# Patient Record
Sex: Female | Born: 1945
Health system: Southern US, Community
[De-identification: ages and names within clinical notes are randomized; demographics above are authoritative.]

## PROBLEM LIST (undated history)

## (undated) DIAGNOSIS — M1990 Unspecified osteoarthritis, unspecified site: Secondary | ICD-10-CM

## (undated) DIAGNOSIS — Z923 Personal history of irradiation: Secondary | ICD-10-CM

## (undated) DIAGNOSIS — K8020 Calculus of gallbladder without cholecystitis without obstruction: Secondary | ICD-10-CM

## (undated) DIAGNOSIS — K222 Esophageal obstruction: Secondary | ICD-10-CM

## (undated) DIAGNOSIS — G4700 Insomnia, unspecified: Secondary | ICD-10-CM

## (undated) DIAGNOSIS — I499 Cardiac arrhythmia, unspecified: Secondary | ICD-10-CM

## (undated) DIAGNOSIS — R05 Cough: Secondary | ICD-10-CM

## (undated) DIAGNOSIS — Z803 Family history of malignant neoplasm of breast: Secondary | ICD-10-CM

## (undated) DIAGNOSIS — J189 Pneumonia, unspecified organism: Secondary | ICD-10-CM

## (undated) DIAGNOSIS — K219 Gastro-esophageal reflux disease without esophagitis: Secondary | ICD-10-CM

## (undated) DIAGNOSIS — T4145XA Adverse effect of unspecified anesthetic, initial encounter: Secondary | ICD-10-CM

## (undated) DIAGNOSIS — T8859XA Other complications of anesthesia, initial encounter: Secondary | ICD-10-CM

## (undated) DIAGNOSIS — R059 Cough, unspecified: Secondary | ICD-10-CM

## (undated) DIAGNOSIS — Z1379 Encounter for other screening for genetic and chromosomal anomalies: Principal | ICD-10-CM

## (undated) DIAGNOSIS — K449 Diaphragmatic hernia without obstruction or gangrene: Secondary | ICD-10-CM

## (undated) DIAGNOSIS — R945 Abnormal results of liver function studies: Secondary | ICD-10-CM

## (undated) DIAGNOSIS — N200 Calculus of kidney: Secondary | ICD-10-CM

## (undated) DIAGNOSIS — F329 Major depressive disorder, single episode, unspecified: Secondary | ICD-10-CM

## (undated) DIAGNOSIS — C50412 Malignant neoplasm of upper-outer quadrant of left female breast: Secondary | ICD-10-CM

## (undated) DIAGNOSIS — E039 Hypothyroidism, unspecified: Secondary | ICD-10-CM

## (undated) DIAGNOSIS — R7989 Other specified abnormal findings of blood chemistry: Secondary | ICD-10-CM

## (undated) DIAGNOSIS — Z87442 Personal history of urinary calculi: Secondary | ICD-10-CM

## (undated) DIAGNOSIS — N281 Cyst of kidney, acquired: Secondary | ICD-10-CM

## (undated) DIAGNOSIS — E785 Hyperlipidemia, unspecified: Secondary | ICD-10-CM

## (undated) DIAGNOSIS — F32A Depression, unspecified: Secondary | ICD-10-CM

## (undated) HISTORY — DX: Abnormal results of liver function studies: R94.5

## (undated) HISTORY — PX: TOOTH EXTRACTION: SUR596

## (undated) HISTORY — DX: Hyperlipidemia, unspecified: E78.5

## (undated) HISTORY — PX: NASAL SINUS SURGERY: SHX719

## (undated) HISTORY — DX: Hypothyroidism, unspecified: E03.9

## (undated) HISTORY — DX: Calculus of gallbladder without cholecystitis without obstruction: K80.20

## (undated) HISTORY — PX: OTHER SURGICAL HISTORY: SHX169

## (undated) HISTORY — DX: Esophageal obstruction: K22.2

## (undated) HISTORY — DX: Insomnia, unspecified: G47.00

## (undated) HISTORY — DX: Cough: R05

## (undated) HISTORY — DX: Encounter for other screening for genetic and chromosomal anomalies: Z13.79

## (undated) HISTORY — PX: BREAST SURGERY: SHX581

## (undated) HISTORY — DX: Family history of malignant neoplasm of breast: Z80.3

## (undated) HISTORY — DX: Cyst of kidney, acquired: N28.1

## (undated) HISTORY — DX: Other specified abnormal findings of blood chemistry: R79.89

## (undated) HISTORY — DX: Diaphragmatic hernia without obstruction or gangrene: K44.9

## (undated) HISTORY — DX: Major depressive disorder, single episode, unspecified: F32.9

## (undated) HISTORY — DX: Gastro-esophageal reflux disease without esophagitis: K21.9

## (undated) HISTORY — DX: Cough, unspecified: R05.9

## (undated) HISTORY — PX: COLONOSCOPY: SHX174

## (undated) HISTORY — PX: DILATION AND CURETTAGE OF UTERUS: SHX78

## (undated) HISTORY — DX: Depression, unspecified: F32.A

## (undated) HISTORY — DX: Malignant neoplasm of upper-outer quadrant of left female breast: C50.412

## (undated) HISTORY — DX: Calculus of kidney: N20.0

---

## 1998-07-06 ENCOUNTER — Other Ambulatory Visit: Admission: RE | Admit: 1998-07-06 | Discharge: 1998-07-06 | Payer: Self-pay | Admitting: Gynecology

## 1999-04-19 ENCOUNTER — Other Ambulatory Visit: Admission: RE | Admit: 1999-04-19 | Discharge: 1999-04-19 | Payer: Self-pay | Admitting: Gynecology

## 1999-04-23 ENCOUNTER — Ambulatory Visit (HOSPITAL_COMMUNITY): Admission: RE | Admit: 1999-04-23 | Discharge: 1999-04-23 | Payer: Self-pay | Admitting: Gynecology

## 2000-07-06 ENCOUNTER — Other Ambulatory Visit: Admission: RE | Admit: 2000-07-06 | Discharge: 2000-07-06 | Payer: Self-pay | Admitting: Gynecology

## 2001-07-30 ENCOUNTER — Other Ambulatory Visit: Admission: RE | Admit: 2001-07-30 | Discharge: 2001-07-30 | Payer: Self-pay | Admitting: Gynecology

## 2002-05-20 ENCOUNTER — Encounter: Payer: Self-pay | Admitting: Internal Medicine

## 2002-06-05 ENCOUNTER — Ambulatory Visit (HOSPITAL_COMMUNITY): Admission: RE | Admit: 2002-06-05 | Discharge: 2002-06-05 | Payer: Self-pay | Admitting: *Deleted

## 2002-06-05 ENCOUNTER — Encounter (INDEPENDENT_AMBULATORY_CARE_PROVIDER_SITE_OTHER): Payer: Self-pay | Admitting: *Deleted

## 2002-06-05 ENCOUNTER — Encounter: Payer: Self-pay | Admitting: Internal Medicine

## 2002-07-08 ENCOUNTER — Other Ambulatory Visit: Admission: RE | Admit: 2002-07-08 | Discharge: 2002-07-08 | Payer: Self-pay | Admitting: Gynecology

## 2003-07-03 ENCOUNTER — Other Ambulatory Visit: Admission: RE | Admit: 2003-07-03 | Discharge: 2003-07-03 | Payer: Self-pay | Admitting: Gynecology

## 2004-07-08 ENCOUNTER — Other Ambulatory Visit: Admission: RE | Admit: 2004-07-08 | Discharge: 2004-07-08 | Payer: Self-pay | Admitting: Gynecology

## 2005-07-14 ENCOUNTER — Other Ambulatory Visit: Admission: RE | Admit: 2005-07-14 | Discharge: 2005-07-14 | Payer: Self-pay | Admitting: Gynecology

## 2006-07-17 HISTORY — PX: US ECHOCARDIOGRAPHY: HXRAD669

## 2006-07-19 HISTORY — PX: CARDIOVASCULAR STRESS TEST: SHX262

## 2007-08-28 ENCOUNTER — Emergency Department (HOSPITAL_COMMUNITY): Admission: EM | Admit: 2007-08-28 | Discharge: 2007-08-28 | Payer: Self-pay | Admitting: Emergency Medicine

## 2010-02-12 ENCOUNTER — Encounter: Payer: Self-pay | Admitting: Internal Medicine

## 2010-07-22 ENCOUNTER — Encounter: Admission: RE | Admit: 2010-07-22 | Discharge: 2010-07-22 | Payer: Self-pay | Admitting: Cardiology

## 2010-07-22 ENCOUNTER — Ambulatory Visit: Payer: Self-pay | Admitting: Cardiology

## 2010-07-28 ENCOUNTER — Ambulatory Visit: Payer: Self-pay | Admitting: Cardiology

## 2010-11-02 ENCOUNTER — Encounter (INDEPENDENT_AMBULATORY_CARE_PROVIDER_SITE_OTHER): Payer: Self-pay | Admitting: *Deleted

## 2010-11-08 ENCOUNTER — Encounter (INDEPENDENT_AMBULATORY_CARE_PROVIDER_SITE_OTHER): Payer: Self-pay | Admitting: *Deleted

## 2010-11-15 ENCOUNTER — Ambulatory Visit: Payer: Self-pay | Admitting: Internal Medicine

## 2010-11-15 DIAGNOSIS — K8020 Calculus of gallbladder without cholecystitis without obstruction: Secondary | ICD-10-CM | POA: Insufficient documentation

## 2010-11-15 DIAGNOSIS — G4700 Insomnia, unspecified: Secondary | ICD-10-CM | POA: Insufficient documentation

## 2010-11-15 DIAGNOSIS — K5900 Constipation, unspecified: Secondary | ICD-10-CM | POA: Insufficient documentation

## 2010-11-15 DIAGNOSIS — R740 Nonspecific elevation of levels of transaminase and lactic acid dehydrogenase [LDH]: Secondary | ICD-10-CM

## 2010-11-15 DIAGNOSIS — E785 Hyperlipidemia, unspecified: Secondary | ICD-10-CM

## 2010-11-15 DIAGNOSIS — R109 Unspecified abdominal pain: Secondary | ICD-10-CM | POA: Insufficient documentation

## 2010-11-15 DIAGNOSIS — F329 Major depressive disorder, single episode, unspecified: Secondary | ICD-10-CM

## 2010-11-15 DIAGNOSIS — K219 Gastro-esophageal reflux disease without esophagitis: Secondary | ICD-10-CM | POA: Insufficient documentation

## 2010-11-15 DIAGNOSIS — E039 Hypothyroidism, unspecified: Secondary | ICD-10-CM | POA: Insufficient documentation

## 2010-11-15 DIAGNOSIS — N281 Cyst of kidney, acquired: Secondary | ICD-10-CM

## 2010-11-15 DIAGNOSIS — R05 Cough: Secondary | ICD-10-CM

## 2010-11-17 ENCOUNTER — Ambulatory Visit (HOSPITAL_COMMUNITY)
Admission: RE | Admit: 2010-11-17 | Discharge: 2010-11-17 | Payer: Self-pay | Source: Home / Self Care | Admitting: Internal Medicine

## 2010-11-30 ENCOUNTER — Encounter: Payer: Self-pay | Admitting: Internal Medicine

## 2010-12-27 LAB — CBC
HCT: 42.6 % (ref 36.0–46.0)
Hemoglobin: 13.9 g/dL (ref 12.0–15.0)
MCH: 29.6 pg (ref 26.0–34.0)
MCHC: 32.6 g/dL (ref 30.0–36.0)
MCV: 90.6 fL (ref 78.0–100.0)
Platelets: 324 10*3/uL (ref 150–400)
RBC: 4.7 MIL/uL (ref 3.87–5.11)
RDW: 13.2 % (ref 11.5–15.5)
WBC: 7.1 10*3/uL (ref 4.0–10.5)

## 2010-12-27 LAB — URINALYSIS, ROUTINE W REFLEX MICROSCOPIC
Bilirubin Urine: NEGATIVE
Ketones, ur: NEGATIVE mg/dL
Leukocytes, UA: NEGATIVE
Nitrite: NEGATIVE
Protein, ur: NEGATIVE mg/dL
Specific Gravity, Urine: 1.011 (ref 1.005–1.030)
Urine Glucose, Fasting: NEGATIVE mg/dL
Urobilinogen, UA: 0.2 mg/dL (ref 0.0–1.0)
pH: 6 (ref 5.0–8.0)

## 2010-12-27 LAB — COMPREHENSIVE METABOLIC PANEL
ALT: 40 U/L — ABNORMAL HIGH (ref 0–35)
AST: 29 U/L (ref 0–37)
Albumin: 4.3 g/dL (ref 3.5–5.2)
Alkaline Phosphatase: 81 U/L (ref 39–117)
BUN: 10 mg/dL (ref 6–23)
CO2: 28 mEq/L (ref 19–32)
Calcium: 9.8 mg/dL (ref 8.4–10.5)
Chloride: 108 mEq/L (ref 96–112)
Creatinine, Ser: 0.82 mg/dL (ref 0.4–1.2)
GFR calc Af Amer: >60 mL/min (ref >60)
GFR calc non Af Amer: >60 mL/min (ref >60)
Glucose, Bld: 93 mg/dL (ref 70–99)
Potassium: 5 mEq/L (ref 3.5–5.1)
Sodium: 143 mEq/L (ref 135–145)
Total Bilirubin: 0.5 mg/dL (ref 0.3–1.2)
Total Protein: 7.4 g/dL (ref 6.0–8.3)

## 2010-12-27 LAB — URINE MICROSCOPIC-ADD ON

## 2010-12-27 LAB — DIFFERENTIAL
Basophils Absolute: 0.1 10*3/uL (ref 0.0–0.1)
Basophils Relative: 1 % (ref 0–1)
Eosinophils Absolute: 0.3 10*3/uL (ref 0.0–0.7)
Eosinophils Relative: 5 % (ref 0–5)
Lymphocytes Relative: 38 % (ref 12–46)
Lymphs Abs: 2.7 10*3/uL (ref 0.7–4.0)
Monocytes Absolute: 0.6 10*3/uL (ref 0.1–1.0)
Monocytes Relative: 9 % (ref 3–12)
Neutro Abs: 3.4 10*3/uL (ref 1.7–7.7)
Neutrophils Relative %: 48 % (ref 43–77)

## 2010-12-27 LAB — SURGICAL PCR SCREEN
MRSA, PCR: NEGATIVE
Staphylococcus aureus: NEGATIVE

## 2010-12-27 LAB — PROTIME-INR
INR: 1.04 (ref 0.00–1.49)
Prothrombin Time: 13.8 seconds (ref 11.6–15.2)

## 2010-12-30 ENCOUNTER — Encounter (INDEPENDENT_AMBULATORY_CARE_PROVIDER_SITE_OTHER): Payer: Self-pay | Admitting: Surgery

## 2010-12-30 ENCOUNTER — Ambulatory Visit (HOSPITAL_COMMUNITY)
Admission: RE | Admit: 2010-12-30 | Discharge: 2010-12-31 | Payer: Self-pay | Source: Home / Self Care | Attending: Surgery | Admitting: Surgery

## 2010-12-30 HISTORY — PX: CHOLECYSTECTOMY: SHX55

## 2011-01-02 NOTE — Discharge Summary (Signed)
  [name], [name]                   ACCOUNT NO.:  [id number]  MEDICAL RECORD NO.:  [id number]          PATIENT TYPE:  OIB  LOCATION:  1538                         FACILITY:  [facility]  PHYSICIAN:  Velora Heckler, MD      DATE OF BIRTH:  1946-08-08  DATE OF ADMISSION:  12/30/2010 DATE OF DISCHARGE:  12/31/2010                              DISCHARGE SUMMARY   REASON FOR ADMISSION:  Symptomatic cholelithiasis.  BRIEF HISTORY:  The patient is a 65 year old white female from [place], [place].  She has had intermittent epigastric abdominal pain.  Ultrasound demonstrated gallstones.  The patient now comes to surgery for cholecystectomy.  HOSPITAL COURSE:  The patient was admitted on January 19 and taken directly to the operating room.  She underwent laparoscopic cholecystectomy with intraoperative cholangiography.  On cholangiogram, there was a small filling defect noted in the common hepatic duct.  This was reviewed by Dr. Francene Boyers from radiology.  While it is possible that this represents choledocholithiasis, it was felt to be more likely to be overlapping shadows versus a small air bubble.  Postoperatively, the patient remained stable and did well.  She is prepared for discharge home on the first postoperative day.  DISCHARGE/PLAN:  The patient is discharged home on December 31, 2010, in good condition, tolerating a regular diet and ambulating independently.  DISCHARGE MEDICATIONS:  Include: 1. Percocet for pain. 2. Ultram for pain. 3. Ambien for sleep.  The patient will be seen back in my office at [facility] in 2-3 weeks for wound check.  We will check a hepatic function profile prior to that office visit.  FINAL DIAGNOSIS:  Cholelithiasis, final pathology results pending at the time of discharge.  CONDITION AT DISCHARGE:  Good.     Velora Heckler, MD     TMG/MEDQ  D:  12/31/2010  T:  12/31/2010  Job:  [id number]  cc:   Hedwig Morton. Juanda Chance, MD [address] [place] [address]  Gwen Pounds, MD Fax: [phone]  Electronically Signed by Darnell Level MD on 01/02/2011 10:01:37 AM

## 2011-01-02 NOTE — Op Note (Signed)
NAMESAVI, LASTINGER                   ACCOUNT NO.:  [id number]  MEDICAL RECORD NO.:  [id number]          PATIENT TYPE:  AMB  LOCATION:  DAY                          FACILITY:  [facility]  PHYSICIAN:  Velora Heckler, MD      DATE OF BIRTH:  09/20/1946  DATE OF PROCEDURE:  12/30/2010 DATE OF DISCHARGE:                              OPERATIVE REPORT   PREOPERATIVE DIAGNOSIS:  Symptomatic cholelithiasis.  POSTOPERATIVE DIAGNOSIS:  Symptomatic cholelithiasis.  PROCEDURE:  Laparoscopic cholecystectomy with intraoperative cholangiography.  SURGEON:  Velora Heckler, M.D.  ANESTHESIA:  General per Jill Side, M.D..  ESTIMATED BLOOD LOSS:  Minimal.  PREPARATION:  ChloraPrep.  COMPLICATIONS:  None.  INDICATIONS:  The patient is a 65 year old white female from [place], [place].  [name].  The patient underwent workup including CT scan of the abdomen and pelvis which had an incidental finding of gallstones.  Angela was seen by Dr. [name] and an abdominal ultrasound confirmed gallstones with the largest measuring 1.5 cm.  No sign of biliary obstruction.  The patient took proton pump inhibitors with some improvement in her symptoms.  Angela now comes to surgery for cholecystectomy for presumed symptomatic cholelithiasis.  BODY OF REPORT:  The procedure was done in OR #6 at the [facility].  The patient was brought to the operating room and placed in the supine position on the operating room table.  Following administration of general anesthesia, the patient was positioned and then prepped and draped in the usual strict aseptic fashion.  After ascertaining that an adequate level of anesthesia had been achieved, an infraumbilical incision was made with a #10 blade.  Dissection was carried down through subcutaneous tissues.  There was a small umbilical hernia which was dissected out and  reduced.  Peritoneum was opened.  A 0 Vicryl pursestring suture was placed in the fascia.  An Hasson cannula was introduced under direct vision and secured with a pursestring suture.  Abdomen was insufflated with carbon dioxide.  Laparoscope was introduced and the abdomen explored.  Operative ports were placed along the right costal margin in the midline, midclavicular line, and anterior axillary line.  Fundus of the gallbladder was grasped and retracted cephalad.  Dissection was begun at the neck of the gallbladder.  Peritoneum was incised.  Cystic duct was dissected out along its length and a clip was placed at the neck of the gallbladder.  Cystic duct was incised and clear yellow bile emanates from the cystic duct.  A Cook cholangiography catheter was introduced through a stab wound in the right upper quadrant.  It was inserted into the cystic duct and secured with a Ligaclip.  Using C-arm fluoroscopy, real time cholangiography was performed.  There was rapid filling of a normal caliber common bile duct.  There was free flow distally into the duodenum.  There was reflux of contrast into the left and right hepatic ductal systems.  There was a filling defect in the common hepatic duct.  This was not obstructive. It is difficult to  ascertain whether this represents a stone or an air bubble or overlapping shadows.  Likewise, Dr. Francene Boyers from Radiology reviewed the film intraoperatively and could not tell for certain whether this represented a stone or an air bubble, although he favored an air bubble.  Clip was withdrawn and Cook catheter was removed from the cystic duct. Cystic duct was then triply clipped and divided.  Cystic artery was dissected out, singly clipped and divided with the electrocautery. Gallbladder was then excised from the gallbladder bed using the hook electrocautery for hemostasis.  Gallbladder was completely excised and then placed into an EndoCatch bag.  The  gallbladder was withdrawn through the umbilical port.  It contains at least one gallstone measuring slightly over 1 cm in size.  The entire gallbladder and contents were submitted to Pathology for review.  Good hemostasis was noted in the right upper quadrant.  Ports were removed under direct vision and good hemostasis was noted at all port sites.  Pneumoperitoneum was released.  At the umbilicus the pursestring suture was removed.  The fascial defect was then closed with interrupted 0 Prolene simple sutures.  Good hemostasis was noted.  Skin incisions were anesthetized with local anesthetic.  All incisions were closed with interrupted 4-0 Monocryl subcuticular sutures.  Wounds were washed and dried and Benzoin and Steri-Strips were applied.  Sterile dressings were applied.  The patient was awakened from anesthesia and brought to the recovery room.  The patient tolerated the procedure well.     Velora Heckler, MD     TMG/MEDQ  D:  12/30/2010  T:  12/30/2010  Job:  [id number]  cc:   Velora Heckler, MD [address] [place] [address]  Hedwig Morton. Juanda Chance, MD [address] [place] [address]  Gwen Pounds, MD Fax: [phone] Electronically Signed by Darnell Level MD on 01/02/2011 10:01:31 AM

## 2011-01-13 ENCOUNTER — Encounter: Payer: Self-pay | Admitting: Internal Medicine

## 2011-01-13 NOTE — Letter (Signed)
Summary: [facility]  [facility]   Imported By: Lamona Curl CMA (AAMA) 11/15/2010 11:37:37  _____________________________________________________________________  External Attachment:    Type:   Image     Comment:   External Document

## 2011-01-13 NOTE — Letter (Signed)
Summary: [facility]  [facility]   Imported By: Sherian Rein 11/18/2010 11:36:12  _____________________________________________________________________  External Attachment:    Type:   Image     Comment:   External Document

## 2011-01-13 NOTE — Letter (Signed)
Summary: CT ABD/[facility]  [facility]   Imported By: Lamona Curl CMA (AAMA) 11/15/2010 11:55:56  _____________________________________________________________________  External Attachment:    Type:   Image     Comment:   External Document

## 2011-01-13 NOTE — Letter (Signed)
Summary: [facility]   Imported By: Lennie Odor 12/22/2010 15:45:39  _____________________________________________________________________  External Attachment:    Type:   Image     Comment:   External Document

## 2011-01-13 NOTE — Procedures (Signed)
Summary: COLON   Colonoscopy  Procedure date:  06/05/2002  Findings:      Location:  [facility].                     [facility]  Patient:    [name], [name] Visit Number: [id number] MRN: [id number]          Service Type: END Location: ENDO Attending Physician:  Melvenia Needles Dictated by:   Everardo All Madilyn Fireman, M.D. Proc. Date: 06/05/02 Admit Date:  06/05/2002   CC:         Thomas A. Patty Sermons, M.D.   Procedure Report  PROCEDURE PERFORMED:  Colonoscopy with biopsy.  ENDOSCOPIST:  Everardo All. Madilyn Fireman, M.D.  INDICATIONS FOR PROCEDURE:  Chronic diarrhea and colon cancer screening in a 65 year old patient with a one to two-year history of diarrhea and no prior colon screening.  DESCRIPTION OF PROCEDURE:  The patient was placed in the left lateral decubitus position and placed on the pulse monitor with continuous low flow oxygen delivered by nasal cannula.   She was sedated with 100 mg of IV Demerol and 8 mg of IV Versed.  The Olympus video colonoscope was inserted into the rectum and advanced to the cecum, confirmed transillumination of McBurneys point and visualization of the ileocecal valve and appendiceal orifice.  Prep was excellent.  The terminal ileum was intubated for several centimeters and appeared to be within normal limits.  The cecum, ascending, transverse, descending and sigmoid colon all appeared normal with no masses, polyps, diverticula or other mucosal abnormalities.  The rectum likewise appeared normal and retroflex view of the anus revealed no obvious internal hemorrhoids.  Biopsies of the rectum were taken to rule out collagenous or microscopic colitis.  The scope was then withdrawn and the patient returned to the recovery room in stable condition.  The patient tolerated the procedure well and there were no immediate complications.  IMPRESSION:  Normal colonoscopy including the terminal ileum.  PLAN: Await histology on rectal  biopsies and would pursue further work-up of diarrhea as indicated.  Repeat flexible sigmoidoscopy in five years for colon screening. Dictated by:   Everardo All Madilyn Fireman, M.D. Attending Physician:  Melvenia Needles DD:  06/05/02 TD:  06/06/02 Job: [id number]  FINAL DIAGNOSIS    ***MICROSCOPIC EXAMINATION AND DIAGNOSIS***    RECTUM, BIOPSY:   - RARE CRYPTITIS.   - NO ATYPIA OR EVIDENCE OF MALIGNANCY.    COMMENT   There is a rare gland that contains a neutrophil or eosinophil   consistent with rare cryptitis. There are some benign   intramucosal lymphoid aggregates. The basement membrane does not   appear thickened in the H & E stained sections and therefore the   findings do not support the diagnosis of collagenous colitis. For   this reason, a Masson' s trichrome stain will not be performed   unless otherwise directed by the clinician. (JAS:gt) 06/06/02    gdt   Date Reported: 06/06/2002 Berneta Levins, MD   *** Electronically Signed Out By JAS ***    Clinical information   R/o collagenous colitis (lb)    specimen(s) obtained   Rectum, biopsy (cold)    Gross Description   Received in formalin are tan, soft tissue fragments that are   submitted in toto. Number:2   Size: each 0.3 cm. (GP:jn, 06/05/02)    jn/

## 2011-01-13 NOTE — Assessment & Plan Note (Signed)
Summary: chest pain, GERD sx/dns   History of Present Illness Visit Type: Initial Visit Primary GI MD: [name] MD Primary Provider: Creola Corn, MD Chief Complaint: Chest pain non-cardiac, GERD, hiatal hernia, gallstone History of Present Illness:   This is a 65 year old white female who is here to discuss an incidental finding of a solitary gallstone on a CT scan of the abdomen performed by urology for evaluation of hematuria one week ago. Patient brought the CD with her and I have reviewed it. The CT Scan showed a solitary gallstone in the gallbladder. Her common bile duct was normal size. There is a hiatal hernia. The urologist referred the patient for a cholecystectomy. Her appointment will be 11/30/10. In the meantime, she wanted to discuss all her symptoms of chest pain which have been evaluated by her internist as well as by gastroenterologist Dr. Madilyn Fireman on previous occasions. She had a negative cardiology workup by Dr. Patty Sermons. She describes almost constant discomfort in the subxiphoid area which is not related to breathing or motion but may be aggravated by eating. She has known gastroesophageal reflux and has been on proton pump inhibitors since 2003. She takes Nexium daily. Her reflux has been refractory to Nexium. She denies dysphagia, hoarseness or nocturnal cough. She also had a colonoscopy for neoplastic screening by Dr. Madilyn Fireman in 2003 and the exam was normal. Her insurance will approve a repeat colonoscopy in 10 years from the initial exam. There is no family history of gallbladder disease. She does not drink alcohol excessively. She has gained about 20 pounds in the last 2 years and she has been under extreme stress due to the death of several relatives. She is on Lexapro for depression.   GI Review of Systems    Reports acid reflux, bloating, chest pain, and  nausea.      Denies abdominal pain, belching, dysphagia with liquids, dysphagia with solids, heartburn, loss of appetite,  vomiting, vomiting blood, weight loss, and  weight gain.        Denies anal fissure, black tarry stools, change in bowel habit, constipation, diarrhea, diverticulosis, fecal incontinence, heme positive stool, hemorrhoids, irritable bowel syndrome, jaundice, light color stool, liver problems, rectal bleeding, and  rectal pain.     Current Medications (verified): 1)  Ambien 10 Mg Tabs (Zolpidem Tartrate) .... Take 1 Tab By Mouth At Bedtime As Needed 2)  Clarinex 5 Mg Tabs (Desloratadine) .... Take 1 Tablet By Mouth Once A Day 3)  Crestor 10 Mg Tabs (Rosuvastatin Calcium) .... Take 1/2 Tablet By Mouth Once Daily 4)  Lexapro 10 Mg Tabs (Escitalopram Oxalate) .... Take 1 Tablet Daily 5)  Estrogel 0.75 Mg/1.25 Gm (0.06%) Gel (Estradiol) .... Aply As Directed 6)  Nexium 40 Mg Cpdr (Esomeprazole Magnesium) .... Take 1 Capsule By Mouth Daily 7)  Rhinocort Aqua 32 Mcg/act Susp (Budesonide) .... Use As Directed 8)  Synthroid 50 Mcg Tabs (Levothyroxine Sodium) .... Take 1 Tablet By Mouth Once A Day 9)  Zetia 10 Mg Tabs (Ezetimibe) .... Take 1 Tablet By Mouth Once A Day  Allergies (verified): 1)  ! Codeine 2)  ! Vicodin  Past History:  Past Medical History: Reviewed history from 11/15/2010 and no changes required. Current Problems:  INSOMNIA UNSPECIFIED (ICD-780.52) DEPRESSION (ICD-311) TRANSAMINASES, SERUM, ELEVATED (ICD-790.4) HYPERLIPIDEMIA (ICD-272.4) CONSTIPATION (ICD-564.00) COUGH (ICD-786.2) GERD (ICD-530.81) HYPOTHYROIDISM (ICD-244.9) RENAL CYST (ICD-593.2) GALLSTONES (ICD-574.20)    Past Surgical History: Tooth Extraction Sinus Surgery Inguinal Hernioggraphy T & A D & C  Family History: Family History  of Breast Cancer: Mother No FH of Colon Cancer:  Social History: Patient has never smoked.  Alcohol Use - no Illicit Drug Use - no Daily Caffeine Use  Review of Systems       The patient complains of allergy/sinus, arthritis/joint pain, back pain, blood in urine,  change in vision, cough, depression-new, fatigue, headaches-new, night sweats, shortness of breath, urine leakage, and vision changes.  The patient denies anemia, anxiety-new, confusion, coughing up blood, fainting, fever, hearing problems, heart murmur, heart rhythm changes, itching, menstrual pain, muscle pains/cramps, nosebleeds, pregnancy symptoms, skin rash, sleeping problems, sore throat, swelling of feet/legs, swollen lymph glands, thirst - excessive , urination - excessive , urination changes/pain, and voice change.         Pertinent positive and negative review of systems were noted in the above HPI. All other ROS was otherwise negative.   Vital Signs:  Patient profile:   65 year old female Height:      68 inches Weight:      187.50 pounds BMI:     28.61 Pulse rate:   104 / minute Pulse rhythm:   regular BP sitting:   132 / 76  (left arm) Cuff size:   regular  Vitals Entered By: June McMurray CMA Duncan Dull) (November 15, 2010 4:44 PM)  Physical Exam  General:  appears depressed. Lungs:  Clear throughout to auscultation. Heart:  Regular rate and rhythm; no murmurs, rubs,  or bruits. Extremities:  No clubbing, cyanosis, edema or deformities noted. Skin:  Intact without significant lesions or rashes. Psych:  Alert and cooperative. Normal mood and affect.   Impression & Recommendations:  Problem # 1:  GALLSTONES (ICD-574.20) There is  a solitary gallstone found on a CT scan incidentally. I doubt that her  her chest pain is related to the gallstone. . Her  request to me  today  is todo whatever needs to be done to  avoid having a gallbladder attack, because she heard from  a friend that it is a severe pain. She would like to have her gall bladder  removed. There is a history of abnormal liver function tests but we do not have documentation of it in our records. Consider fatty liver, possible biliary dysfunction or Crestor related LFT abnormalities. We will proceed with an upper  abdominal ultrasound and possibly HIDA scan. Patient is undecided about going ahead with gallbladder removal. Regardless, we will try to arrange for a surgical consultation before the current scheduled date.the purpose of all having ultrasound and HIDA scan is to determine her risks for gallbladder attack. My personal recommendations to her is to wait with cholecystectomy.  Orders: Ultrasound Abdomen ([facility]) [facility])  Problem # 2:  GERD (ICD-530.81) Patient has breakthrough symptoms on Nexium 40 mg daily. She does have a hernia on her CT scan and chest x-ray. I have asked her to increase her Nexium to 40 mg twice a day. I have told her that even after removing her gallbladder, her chest pain may not go away. I suggested an upper endoscopy and she may consider that but not at the moment.  Problem # 3:  CONSTIPATION (ICD-564.00) Patient is status post normal colonoscopy in 2003. A recall colonoscopy will be scheduled in 2013.  Patient Instructions: 1)  Stay on low-fat diet and try to lose weight. 2)  An upper abdominal ultrasound has been scheduled for 11/17/10. 3)  Increase Nexium to 40 mg p.o. b.i.d. 4)  Depending on the ultrasound, consider  a HIDA scan and upper endoscopy. 5)  Keep appointment with the surgeon. 6)  Copy sent to : Dr Creola Corn 7)  The medication list was reviewed and reconciled.  All changed / newly prescribed medications were explained.  A complete medication list was provided to the patient / caregiver.

## 2011-01-13 NOTE — Letter (Signed)
Summary: [facility]  [facility]   Imported By: Sherian Rein 11/18/2010 11:37:28  _____________________________________________________________________  External Attachment:    Type:   Image     Comment:   External Document

## 2011-01-13 NOTE — Letter (Signed)
Summary: [facility]  [facility]   Imported By: Lamona Curl CMA (AAMA) 11/15/2010 11:55:15  _____________________________________________________________________  External Attachment:    Type:   Image     Comment:   External Document

## 2011-01-20 ENCOUNTER — Encounter: Payer: Self-pay | Admitting: Internal Medicine

## 2011-01-25 ENCOUNTER — Encounter: Payer: Self-pay | Admitting: Internal Medicine

## 2011-01-26 ENCOUNTER — Encounter: Payer: Self-pay | Admitting: Internal Medicine

## 2011-01-27 ENCOUNTER — Encounter: Payer: Self-pay | Admitting: Internal Medicine

## 2011-01-31 ENCOUNTER — Telehealth: Payer: Self-pay | Admitting: Internal Medicine

## 2011-02-02 NOTE — Letter (Signed)
Summary: [facility]  [facility]   Imported By: Lamona Curl CMA (AAMA) 01/28/2011 16:12:56  _____________________________________________________________________  External Attachment:    Type:   Image     Comment:   External Document

## 2011-02-02 NOTE — Letter (Signed)
Summary: [facility]  [facility]   Imported By: Lamona Curl CMA (AAMA) 01/28/2011 16:12:24  _____________________________________________________________________  External Attachment:    Type:   Image     Comment:   External Document

## 2011-02-02 NOTE — Letter (Signed)
Summary: [facility]  [facility]   Imported By: Lamona Curl CMA (AAMA) 01/27/2011 10:55:30  _____________________________________________________________________  External Attachment:    Type:   Image     Comment:   External Document

## 2011-02-08 NOTE — Letter (Signed)
Summary: [facility]  [facility]   Imported By: Lamona Curl CMA (AAMA) 01/31/2011 14:24:45  _____________________________________________________________________  External Attachment:    Type:   Image     Comment:   External Document

## 2011-02-08 NOTE — Progress Notes (Signed)
Summary: ? about Office Appt  ---- Converted from flag ----  ---- 01/27/2011 11:00 AM, Karen Kitchens Nelson-Smith CMA (AAMA) wrote: Dr B- I noticed in the note from Dr Timothy Lasso dated 01/20/11, #5 on problem list, it states "discussing endo and may neede ERCP???" Patient recently had cholecystectomy.Marland KitchenMarland KitchenMarland KitchenI dont see where there has been any correspondence between [place] and the patient regarding this, and no appointment has ever been made. Would you like to see her in the office or wait for her to call us? ------------------------------ ---- 01/28/2011 7:41 AM, Hart Carwin MD wrote: Thanks for reminding me. She ought to have OV  first available, and have LFT's done prior to the visit. There was a questionable stone vs bubble in CBD at [facility] but if she is doing clinically well,  she will not need an ERCP.  ---- 01/28/2011 4:10 PM, Karen Kitchens Nelson-Smith CMA (AAMA) wrote: Dr Juanda Chance, patient has been scheduled for OV 03/11/11 (first available). I advised her that she would need LFT's completed before that visit. However, she states that she had LFTs done 01-13-11 with Dr Timothy Lasso and they were fine. She also states that Dr Gerrit Friends doesnt seem to think she needs ERCP. I have gotten those records and have placed them into EMR for your review. Either way, I told her she needs to see you for continued GERD as per Dr Ferd Hibbs request. She wanted me to ask if you will need labs again even though she just had LFT's on 01-13-11. Advised her that you probably would still like to have more recent labs when she comes to office on 3-30 but that I would ask you first.  ---- 01/28/2011 7:36 PM, Hart Carwin MD wrote: LFT's from 01/13/2011 reviewed and are normal. If we need repeat LFT's I can order them when I see her in the OV. It looks like she will not need an ERCP, so she is correct on that. She will be due for colonoscopy 2013 or before is she is still having diarrhea, I will see her for GERD as well. So... she ought to keep her  appointment.    Patient advised of the above. Dottie Nelson-Smith CMA Duncan Dull)  January 31, 2011 2:35 PM

## 2011-02-17 NOTE — Letter (Signed)
Summary: [facility]   Imported By: Sherian Rein 02/10/2011 11:15:57  _____________________________________________________________________  External Attachment:    Type:   Image     Comment:   External Document

## 2011-03-11 ENCOUNTER — Other Ambulatory Visit: Payer: Self-pay | Admitting: Cardiology

## 2011-03-11 ENCOUNTER — Ambulatory Visit (INDEPENDENT_AMBULATORY_CARE_PROVIDER_SITE_OTHER): Admitting: Internal Medicine

## 2011-03-11 ENCOUNTER — Encounter: Payer: Self-pay | Admitting: Internal Medicine

## 2011-03-11 VITALS — BP 120/74 | HR 80 | Wt 182.6 lb

## 2011-03-11 DIAGNOSIS — J302 Other seasonal allergic rhinitis: Secondary | ICD-10-CM

## 2011-03-11 DIAGNOSIS — K219 Gastro-esophageal reflux disease without esophagitis: Secondary | ICD-10-CM

## 2011-03-11 MED ORDER — DESLORATADINE 5 MG PO TABS
5.0000 mg | ORAL_TABLET | Freq: Every day | ORAL | Status: DC
Start: 2011-03-11 — End: 2012-01-12

## 2011-03-11 NOTE — Telephone Encounter (Signed)
NEEDS FAX SENT TO [facility]. CLARINEX 5MG . PATIENT CALLED THIS MORNING TO [facility] AND THE DO HAVE CLARINEX BUT NEEDS SCRIPT FAXED.

## 2011-03-11 NOTE — Progress Notes (Signed)
[name] 02/13/1946 MRN [id number]   History of Present Illness:  This is a 65 year old white female who is post recent cholecystectomy on 12/30/2010 by Dr Gerrit Friends. She had a large solitary gallstone. An intraoperative cholangiogram showed a small filling defect distally which was likely related to a small air bubble. She has been significantly improved since the cholecystectomy but continues to have gastroesophageal reflux controlled on Nexium 40 mg daily. She at one point had to increase it to twice a day but since the cholecystectomy, her reflux has improved. She has a history of a hiatal hernia documented on a CT scan of the abdomen. She has nocturnal regurgitation of food. She denies dysphagia. Her liver function tests on 11/13/2011 were normal. Hemoccults done by Dr. Timothy Lasso were negative .   Past Medical History  Diagnosis Date  . Insomnia   . Depression   . Hyperlipemia   . Constipation   . Cough   . GERD (gastroesophageal reflux disease)   . Hypothyroidism   . Renal cyst   . Gallstones   . Elevated liver function tests    Past Surgical History  Procedure Date  . Tooth extraction   . Nasal sinus surgery   . Inguinal herniography   . T & a   . Dilation and curettage of uterus   . Cholecystectomy 12/30/10    reports that she has never smoked. She does not have any smokeless tobacco history on file. She reports that she does not drink alcohol or use illicit drugs. family history includes Breast cancer in her mother.  There is no history of Colon cancer. Allergies  Allergen Reactions  . Codeine   . Hydrocodone-Acetaminophen         Review of Systems: Negative for chest pain, dysphagia, nausea or vomiting, positive for regurgitation of the food and periodic reflux  The remainder of the 10  point ROS is negative except as outlined in H&P    Assessment and Plan: Problems #1 status post cholecystectomy. Normal liver function tests. All biliary symptoms have been relieved  since surgery. No further evaluation is indicated at this time.  Problem #2 gastroesophageal reflux. Patient has breakthrough symptoms on Nexium 40 mg daily. We have discussed strict antireflux measures including head of the bed elevation. We will proceed with an upper endoscopy to assess for a hiatal hernia or Barrett's esophagus.   03/11/2011 [name]

## 2011-03-11 NOTE — Telephone Encounter (Signed)
Patient called requesting rx

## 2011-03-11 NOTE — Patient Instructions (Addendum)
You have been scheduled for an endoscopy on 03/25/11. Please follow written instructions given to you at your visit today. CC: Dr Creola Corn

## 2011-03-23 ENCOUNTER — Other Ambulatory Visit: Admitting: Internal Medicine

## 2011-03-24 ENCOUNTER — Encounter: Payer: Self-pay | Admitting: Internal Medicine

## 2011-03-25 ENCOUNTER — Encounter: Payer: Self-pay | Admitting: Internal Medicine

## 2011-03-25 ENCOUNTER — Ambulatory Visit (AMBULATORY_SURGERY_CENTER): Admitting: Internal Medicine

## 2011-03-25 VITALS — BP 137/80 | HR 82 | Temp 98.7°F | Resp 15 | Ht 68.0 in | Wt 182.0 lb

## 2011-03-25 DIAGNOSIS — K219 Gastro-esophageal reflux disease without esophagitis: Secondary | ICD-10-CM

## 2011-03-25 DIAGNOSIS — K222 Esophageal obstruction: Secondary | ICD-10-CM

## 2011-03-25 DIAGNOSIS — R109 Unspecified abdominal pain: Secondary | ICD-10-CM

## 2011-03-25 DIAGNOSIS — K449 Diaphragmatic hernia without obstruction or gangrene: Secondary | ICD-10-CM

## 2011-03-25 MED ORDER — SODIUM CHLORIDE 0.9 % IV SOLN: 500.0000 mL | INTRAVENOUS | Status: DC

## 2011-03-25 NOTE — Patient Instructions (Signed)
Please read the handouts regarding your diagnoses.  Take maalox or nexium for breakthrough pain.   Biopsy results will be sent to you within two weeks.  Please call if you have any questions or concerns.  Thank-you.

## 2011-03-28 ENCOUNTER — Telehealth: Payer: Self-pay | Admitting: *Deleted

## 2011-03-28 NOTE — Telephone Encounter (Signed)
Follow up Call- Patient questions:  Do you have a fever, pain , or abdominal swelling? no Pain Score  0 *  Have you tolerated food without any problems? yes  Have you been able to return to your normal activities? yes  Do you have any questions about your discharge instructions: Diet   no Medications  no Follow up visit  no  Do you have questions or concerns about your Care? no  Actions: * If pain score is 4 or above: No action needed, pain <4.  Patient c/o sore throat,but does not seem to be getting worse and able to tolerate food. Instructed patient to watch soreness and call us if not getting better.

## 2011-03-29 ENCOUNTER — Encounter: Payer: Self-pay | Admitting: Internal Medicine

## 2011-04-01 ENCOUNTER — Encounter: Payer: Self-pay | Admitting: *Deleted

## 2011-04-28 ENCOUNTER — Other Ambulatory Visit: Payer: Self-pay | Admitting: *Deleted

## 2011-04-29 ENCOUNTER — Telehealth: Payer: Self-pay | Admitting: Cardiology

## 2011-04-29 DIAGNOSIS — E039 Hypothyroidism, unspecified: Secondary | ICD-10-CM

## 2011-04-29 MED ORDER — LEVOTHYROXINE SODIUM 50 MCG PO TABS: 50.0000 ug | ORAL_TABLET | Freq: Every day | ORAL | Status: DC

## 2011-04-29 NOTE — Telephone Encounter (Signed)
CALLING in regard to her Synthroid refill request

## 2011-04-29 NOTE — Procedures (Signed)
[place]  Patient:    [name], [name] Visit Number: [id number] MRN: [id number]          Service Type: END Location: ENDO Attending Physician:  Melvenia Needles Dictated by:   Everardo All Madilyn Fireman, M.D. Proc. Date: 06/05/02 Admit Date:  06/05/2002   CC:         Thomas A. Patty Sermons, M.D.   Procedure Report  PROCEDURE PERFORMED:  Colonoscopy with biopsy.  ENDOSCOPIST:  Everardo All. Madilyn Fireman, M.D.  INDICATIONS FOR PROCEDURE:  Chronic diarrhea and colon cancer screening in a 65 year old patient with a one to two-year history of diarrhea and no prior colon screening.  DESCRIPTION OF PROCEDURE:  The patient was placed in the left lateral decubitus position and placed on the pulse monitor with continuous low flow oxygen delivered by nasal cannula.   She was sedated with 100 mg of IV Demerol and 8 mg of IV Versed.  The Olympus video colonoscope was inserted into the rectum and advanced to the cecum, confirmed transillumination of McBurneys point and visualization of the ileocecal valve and appendiceal orifice.  Prep was excellent.  The terminal ileum was intubated for several centimeters and appeared to be within normal limits.  The cecum, ascending, transverse, descending and sigmoid colon all appeared normal with no masses, polyps, diverticula or other mucosal abnormalities.  The rectum likewise appeared normal and retroflex view of the anus revealed no obvious internal hemorrhoids.  Biopsies of the rectum were taken to rule out collagenous or microscopic colitis.  The scope was then withdrawn and the patient returned to the recovery room in stable condition.  The patient tolerated the procedure well and there were no immediate complications.  IMPRESSION:  Normal colonoscopy including the terminal ileum.  PLAN: Await histology on rectal biopsies and would pursue further work-up of diarrhea as indicated.  Repeat flexible sigmoidoscopy in five years for  colon screening. Dictated by:   Everardo All Madilyn Fireman, M.D. Attending Physician:  Melvenia Needles DD:  06/05/02 TD:  06/06/02 Job: [id number]

## 2011-04-29 NOTE — Telephone Encounter (Signed)
escribe request  

## 2011-05-05 ENCOUNTER — Encounter: Payer: Self-pay | Admitting: Cardiology

## 2011-06-27 ENCOUNTER — Other Ambulatory Visit: Payer: Self-pay | Admitting: Cardiology

## 2011-06-27 DIAGNOSIS — E785 Hyperlipidemia, unspecified: Secondary | ICD-10-CM

## 2011-06-27 MED ORDER — EZETIMIBE 10 MG PO TABS: 10.0000 mg | ORAL_TABLET | Freq: Every day | ORAL | Status: DC

## 2011-06-27 MED ORDER — ROSUVASTATIN CALCIUM 10 MG PO TABS: ORAL_TABLET | ORAL | Status: DC

## 2011-06-27 NOTE — Telephone Encounter (Signed)
Pt needs zetia and crestor [facility].

## 2011-06-27 NOTE — Telephone Encounter (Signed)
Sent as requested.

## 2011-08-08 ENCOUNTER — Encounter: Payer: Self-pay | Admitting: Cardiology

## 2011-08-08 ENCOUNTER — Other Ambulatory Visit (INDEPENDENT_AMBULATORY_CARE_PROVIDER_SITE_OTHER): Admitting: *Deleted

## 2011-08-08 ENCOUNTER — Other Ambulatory Visit: Payer: Self-pay | Admitting: Cardiology

## 2011-08-08 ENCOUNTER — Ambulatory Visit (INDEPENDENT_AMBULATORY_CARE_PROVIDER_SITE_OTHER): Admitting: Cardiology

## 2011-08-08 VITALS — BP 120/80 | HR 80 | Wt 182.0 lb

## 2011-08-08 DIAGNOSIS — N281 Cyst of kidney, acquired: Secondary | ICD-10-CM

## 2011-08-08 DIAGNOSIS — E785 Hyperlipidemia, unspecified: Secondary | ICD-10-CM

## 2011-08-08 DIAGNOSIS — F329 Major depressive disorder, single episode, unspecified: Secondary | ICD-10-CM

## 2011-08-08 DIAGNOSIS — E039 Hypothyroidism, unspecified: Secondary | ICD-10-CM

## 2011-08-08 DIAGNOSIS — R059 Cough, unspecified: Secondary | ICD-10-CM

## 2011-08-08 DIAGNOSIS — R05 Cough: Secondary | ICD-10-CM

## 2011-08-08 DIAGNOSIS — F3289 Other specified depressive episodes: Secondary | ICD-10-CM

## 2011-08-08 DIAGNOSIS — R7401 Elevation of levels of liver transaminase levels: Secondary | ICD-10-CM

## 2011-08-08 DIAGNOSIS — E7800 Pure hypercholesterolemia, unspecified: Secondary | ICD-10-CM

## 2011-08-08 LAB — HEPATIC FUNCTION PANEL
ALT: 43 U/L — ABNORMAL HIGH (ref 0–35)
Total Protein: 7 g/dL (ref 6.0–8.3)

## 2011-08-08 LAB — LIPID PANEL
HDL: 43.1 mg/dL (ref >39.00)
Total CHOL/HDL Ratio: 3
Triglycerides: 151 mg/dL — ABNORMAL HIGH (ref 0.0–149.0)
VLDL: 30.2 mg/dL (ref 0.0–40.0)

## 2011-08-08 LAB — CBC WITH DIFFERENTIAL/PLATELET
Basophils Relative: 0.6 % (ref 0.0–3.0)
Eosinophils Relative: 8.8 % — ABNORMAL HIGH (ref 0.0–5.0)
Hemoglobin: 14.4 g/dL (ref 12.0–15.0)
Lymphocytes Relative: 38.9 % (ref 12.0–46.0)
Monocytes Relative: 7.6 % (ref 3.0–12.0)
Neutrophils Relative %: 44.1 % (ref 43.0–77.0)
RBC: 4.81 Mil/uL (ref 3.87–5.11)
WBC: 7.7 10*3/uL (ref 4.5–10.5)

## 2011-08-08 LAB — BASIC METABOLIC PANEL
CO2: 28 mEq/L (ref 19–32)
Calcium: 9.8 mg/dL (ref 8.4–10.5)
Creatinine, Ser: 0.7 mg/dL (ref 0.4–1.2)
GFR: 85.05 mL/min (ref >60.00)
Glucose, Bld: 110 mg/dL — ABNORMAL HIGH (ref 70–99)

## 2011-08-08 MED ORDER — ZOLPIDEM TARTRATE 10 MG PO TABS: 10.0000 mg | ORAL_TABLET | Freq: Every evening | ORAL | Status: DC | PRN

## 2011-08-08 NOTE — Progress Notes (Signed)
[name] Date of Birth:  01/07/1946 [facility] / [facility] [address]   [address] [place], [place]  [address] [phone]           Fax   [phone]  HPI: This pleasant 65 year old married Caucasian woman is seen for a one-year followup office visit.  She has a past history of hyperlipidemia and a past history of hypothyroidism.  In the past she is also had some problems with depression.  Recently she's been doing well.  She has been getting some exercise but he intends to get more.  She has arranged to have a Systems analyst for weekly workouts.  She denies any chest discomfort or shortness of breath.  She is on Crestor and Zetia and is not having any side effects.  Current Outpatient Prescriptions  Medication Sig Dispense Refill  . budesonide (RHINOCORT AQUA) 32 MCG/ACT nasal spray 1 spray by Nasal route. Use as directed       . desloratadine (CLARINEX) 5 MG tablet Take 1 tablet (5 mg total) by mouth daily.  90 tablet  3  . escitalopram (LEXAPRO) 10 MG tablet Take 10 mg by mouth daily.        Marland Kitchen esomeprazole (NEXIUM) 40 MG capsule Take 40 mg by mouth daily.        . Estradiol (ESTROGEL) 0.75 MG/1.25 GM (0.06%) topical gel Place 1.25 g onto the skin. Apply as directed       . ezetimibe (ZETIA) 10 MG tablet Take 1 tablet (10 mg total) by mouth daily.  90 tablet  3  . levothyroxine (SYNTHROID, LEVOTHROID) 50 MCG tablet Take 1 tablet (50 mcg total) by mouth daily.  90 tablet  3  . rosuvastatin (CRESTOR) 10 MG tablet Take 1/2 tablet by mouth once daily or as directed  90 tablet  3  . zolpidem (AMBIEN) 10 MG tablet Take 1 tablet (10 mg total) by mouth at bedtime as needed.  90 tablet  3   Current Facility-Administered Medications  Medication Dose Route Frequency Provider Last Rate Last Dose  . DISCONTD: 0.9 %  sodium chloride infusion  500 mL Intravenous Continuous Hart Carwin, MD        Allergies  Allergen Reactions  . Codeine   . Hydrocodone-Acetaminophen      Patient Active Problem List  Diagnoses  . HYPOTHYROIDISM  . HYPERLIPIDEMIA  . DEPRESSION  . GERD  . CONSTIPATION  . GALLSTONES  . RENAL CYST  . INSOMNIA UNSPECIFIED  . COUGH  . ABDOMINAL PAIN OTHER SPECIFIED SITE  . TRANSAMINASES, SERUM, ELEVATED    History  Smoking status  . Never Smoker   Smokeless tobacco  . Not on file    History  Alcohol Use No    Family History  Problem Relation Age of Onset  . Breast cancer Mother   . Colon cancer Neg Hx     Review of Systems: The patient denies any heat or cold intolerance.  No weight gain or weight loss.  The patient denies headaches or blurry vision.  There is no cough or sputum production.  The patient denies dizziness.  There is no hematuria or hematochezia.  The patient denies any muscle aches or arthritis.  The patient denies any rash.  The patient denies frequent falling or instability.  There is no history of depression or anxiety.  All other systems were reviewed and are negative.   Physical Exam: Filed Vitals:   08/08/11 1511  BP: 120/80  Pulse:  80  The general appearance feels a well-developed well-nourished woman in no distress.The head and neck exam reveals pupils equal and reactive.  Extraocular movements are full.  There is no scleral icterus.  The mouth and pharynx are normal.  The neck is supple.  The carotids reveal no bruits.  The jugular venous pressure is normal.  The  thyroid is not enlarged.  There is no lymphadenopathy.  The chest is clear to percussion and auscultation.  There are no rales or rhonchi.  Expansion of the chest is symmetrical.  The precordium is quiet.  The first heart sound is normal.  The second heart sound is physiologically split.  There is no murmur gallop rub or click.  There is no abnormal lift or heave.  The abdomen is soft and nontender.  The bowel sounds are normal.  The liver and spleen are not enlarged.  There are no abdominal masses.  There are no abdominal bruits.  Extremities  reveal good pedal pulses.  There is no phlebitis or edema.  There is no cyanosis or clubbing.  Strength is normal and symmetrical in all extremities.  There is no lateralizing weakness.  There are no sensory deficits.  The skin is warm and dry.  There is no rash.      Assessment / Plan: Continue on present medication.  Recheck in one year for followup office visit and lab work.

## 2011-08-08 NOTE — Assessment & Plan Note (Signed)
The patient is on Synthroid.  She is clinically euthyroid.

## 2011-08-08 NOTE — Assessment & Plan Note (Signed)
The patient has a history of hypercholesterolemia.  She is on low-dose Crestor and Zetia.  She is not able to tolerate high-dose statins.  At the present doses she is not having any side effects of myalgias.  Blood work today is pending.

## 2011-08-10 ENCOUNTER — Telehealth: Payer: Self-pay | Admitting: Cardiology

## 2011-08-10 NOTE — Telephone Encounter (Signed)
Pt called about lab results.  Please call pt back to discuss.

## 2011-08-10 NOTE — Telephone Encounter (Signed)
Advised of labs and mailed copy 

## 2011-08-10 NOTE — Telephone Encounter (Signed)
Message copied by Burnell Blanks on Wed Aug 10, 2011  2:47 PM ------      Message from: Cassell Clement      Created: Tue Aug 09, 2011  8:18 AM       Please report.  Cholesterol and LDL are good.      Blood sugar and TGs are slightly high so watch  carbs and lose weight and exercise more.  K is slightly high so avoid salt substitute and high K foods.      SGOT slightly high c/w Crestor.  CSD of Crestor.      CBC nl.  No anemia.

## 2011-11-11 ENCOUNTER — Other Ambulatory Visit: Payer: Self-pay | Admitting: Cardiology

## 2011-11-11 MED ORDER — ESOMEPRAZOLE MAGNESIUM 40 MG PO CPDR: 40.0000 mg | DELAYED_RELEASE_CAPSULE | Freq: Every day | ORAL | Status: DC

## 2011-11-11 NOTE — Telephone Encounter (Signed)
New Msg: Pt said RX has to be written out to [facility]. Please return pt call with any questions.

## 2011-11-11 NOTE — Telephone Encounter (Signed)
Refilled nexium 

## 2012-01-05 DIAGNOSIS — L219 Seborrheic dermatitis, unspecified: Secondary | ICD-10-CM | POA: Diagnosis not present

## 2012-01-05 DIAGNOSIS — D235 Other benign neoplasm of skin of trunk: Secondary | ICD-10-CM | POA: Diagnosis not present

## 2012-01-06 DIAGNOSIS — B029 Zoster without complications: Secondary | ICD-10-CM | POA: Diagnosis not present

## 2012-01-06 DIAGNOSIS — B0229 Other postherpetic nervous system involvement: Secondary | ICD-10-CM | POA: Diagnosis not present

## 2012-01-10 DIAGNOSIS — B0239 Other herpes zoster eye disease: Secondary | ICD-10-CM | POA: Diagnosis not present

## 2012-01-10 DIAGNOSIS — B029 Zoster without complications: Secondary | ICD-10-CM | POA: Diagnosis not present

## 2012-01-10 DIAGNOSIS — B0229 Other postherpetic nervous system involvement: Secondary | ICD-10-CM | POA: Diagnosis not present

## 2012-01-10 DIAGNOSIS — H0289 Other specified disorders of eyelid: Secondary | ICD-10-CM | POA: Diagnosis not present

## 2012-01-12 ENCOUNTER — Other Ambulatory Visit: Payer: Self-pay | Admitting: Cardiology

## 2012-01-12 DIAGNOSIS — J302 Other seasonal allergic rhinitis: Secondary | ICD-10-CM

## 2012-01-12 MED ORDER — DESLORATADINE 5 MG PO TABS: 5.0000 mg | ORAL_TABLET | Freq: Every day | ORAL | Status: DC

## 2012-01-17 ENCOUNTER — Other Ambulatory Visit: Payer: Self-pay | Admitting: Cardiology

## 2012-01-17 DIAGNOSIS — J302 Other seasonal allergic rhinitis: Secondary | ICD-10-CM

## 2012-01-17 MED ORDER — DESLORATADINE 5 MG PO TABS: 5.0000 mg | ORAL_TABLET | Freq: Every day | ORAL | Status: DC

## 2012-01-17 NOTE — Telephone Encounter (Signed)
Spoke with patient and she has not been notified Rx at pharmacy, sent to [facility] again

## 2012-01-17 NOTE — Telephone Encounter (Signed)
Fu call Pt wants refill of clarinex. She said she called last Thursday and [facility] a request Please call her back

## 2012-01-18 DIAGNOSIS — Z0000 Encounter for general adult medical examination without abnormal findings: Secondary | ICD-10-CM | POA: Diagnosis not present

## 2012-01-18 DIAGNOSIS — E039 Hypothyroidism, unspecified: Secondary | ICD-10-CM | POA: Diagnosis not present

## 2012-01-18 DIAGNOSIS — E785 Hyperlipidemia, unspecified: Secondary | ICD-10-CM | POA: Diagnosis not present

## 2012-01-20 DIAGNOSIS — H0230 Blepharochalasis unspecified eye, unspecified eyelid: Secondary | ICD-10-CM | POA: Diagnosis not present

## 2012-01-20 DIAGNOSIS — B0239 Other herpes zoster eye disease: Secondary | ICD-10-CM | POA: Diagnosis not present

## 2012-01-23 DIAGNOSIS — H0230 Blepharochalasis unspecified eye, unspecified eyelid: Secondary | ICD-10-CM | POA: Diagnosis not present

## 2012-01-25 DIAGNOSIS — Z0000 Encounter for general adult medical examination without abnormal findings: Secondary | ICD-10-CM | POA: Diagnosis not present

## 2012-01-25 DIAGNOSIS — R3129 Other microscopic hematuria: Secondary | ICD-10-CM | POA: Diagnosis not present

## 2012-01-25 DIAGNOSIS — E039 Hypothyroidism, unspecified: Secondary | ICD-10-CM | POA: Diagnosis not present

## 2012-01-25 DIAGNOSIS — Z23 Encounter for immunization: Secondary | ICD-10-CM | POA: Diagnosis not present

## 2012-01-25 DIAGNOSIS — B0229 Other postherpetic nervous system involvement: Secondary | ICD-10-CM | POA: Diagnosis not present

## 2012-01-27 ENCOUNTER — Telehealth: Payer: Self-pay | Admitting: Cardiology

## 2012-01-27 DIAGNOSIS — N6019 Diffuse cystic mastopathy of unspecified breast: Secondary | ICD-10-CM | POA: Diagnosis not present

## 2012-03-15 ENCOUNTER — Encounter: Payer: Self-pay | Admitting: *Deleted

## 2012-03-16 ENCOUNTER — Other Ambulatory Visit: Payer: Self-pay | Admitting: Cardiology

## 2012-03-16 DIAGNOSIS — E039 Hypothyroidism, unspecified: Secondary | ICD-10-CM

## 2012-04-03 ENCOUNTER — Other Ambulatory Visit: Payer: Self-pay | Admitting: Cardiology

## 2012-04-03 DIAGNOSIS — E039 Hypothyroidism, unspecified: Secondary | ICD-10-CM

## 2012-04-03 NOTE — Telephone Encounter (Addendum)
New msg Pt is requesting refill of levothyroxine [facility] Please let her know when done. She also is wanting refill of lexapro.

## 2012-04-06 ENCOUNTER — Telehealth: Payer: Self-pay | Admitting: Cardiology

## 2012-04-06 DIAGNOSIS — E039 Hypothyroidism, unspecified: Secondary | ICD-10-CM

## 2012-04-06 MED ORDER — LEVOTHYROXINE SODIUM 50 MCG PO TABS: 50.0000 ug | ORAL_TABLET | Freq: Every day | ORAL | Status: DC

## 2012-04-06 NOTE — Telephone Encounter (Signed)
Refilled medication as requested.

## 2012-04-06 NOTE — Telephone Encounter (Signed)
New msg Pt upset about not getting synthroid refill . She said she has tried several times. She said she is out of this med now please call her back

## 2012-05-09 ENCOUNTER — Telehealth: Payer: Self-pay | Admitting: Cardiology

## 2012-05-09 DIAGNOSIS — E785 Hyperlipidemia, unspecified: Secondary | ICD-10-CM

## 2012-05-09 MED ORDER — EZETIMIBE 10 MG PO TABS
10.0000 mg | ORAL_TABLET | Freq: Every day | ORAL | Status: DC
Start: 2012-05-09 — End: 2013-05-10

## 2012-05-09 NOTE — Telephone Encounter (Signed)
Rx'd as requested.

## 2012-05-09 NOTE — Telephone Encounter (Signed)
New msg Pt wants to talk to you about zetia 10 mg

## 2012-06-04 DIAGNOSIS — H52229 Regular astigmatism, unspecified eye: Secondary | ICD-10-CM | POA: Diagnosis not present

## 2012-06-04 DIAGNOSIS — H5210 Myopia, unspecified eye: Secondary | ICD-10-CM | POA: Diagnosis not present

## 2012-06-04 DIAGNOSIS — H25019 Cortical age-related cataract, unspecified eye: Secondary | ICD-10-CM | POA: Diagnosis not present

## 2012-06-27 ENCOUNTER — Telehealth: Payer: Self-pay | Admitting: Internal Medicine

## 2012-06-28 ENCOUNTER — Encounter: Payer: Self-pay | Admitting: *Deleted

## 2012-06-28 MED ORDER — SUCRALFATE 1 GM/10ML PO SUSP: ORAL | Status: DC

## 2012-06-28 NOTE — Telephone Encounter (Signed)
Patient reports she is having a lot of problems with reflux. She states this is getting worse lately. She was up last night several times due to reflux. She is taking Nexium BID and antacids for breakthrough without relief. Reports she elevates HOB and avoids eating late meals. Scheduled OV on 07/10/12 at 2:30 PM with Dr. Juanda Chance. Please, advise.

## 2012-06-28 NOTE — Telephone Encounter (Signed)
Please add Carafate slurry 10cc po bid, 12 oz, 2 refills she can take Gaviscon 1-2 tabs prn breakthrough Sx's.

## 2012-06-28 NOTE — Telephone Encounter (Signed)
Rx sent. Patient notified of Dr. Brodie's recommendations. 

## 2012-07-04 DIAGNOSIS — C44319 Basal cell carcinoma of skin of other parts of face: Secondary | ICD-10-CM | POA: Diagnosis not present

## 2012-07-10 ENCOUNTER — Ambulatory Visit (INDEPENDENT_AMBULATORY_CARE_PROVIDER_SITE_OTHER): Payer: Medicare Other | Admitting: Internal Medicine

## 2012-07-10 ENCOUNTER — Encounter: Payer: Self-pay | Admitting: Internal Medicine

## 2012-07-10 ENCOUNTER — Other Ambulatory Visit (INDEPENDENT_AMBULATORY_CARE_PROVIDER_SITE_OTHER): Payer: Medicare Other

## 2012-07-10 VITALS — BP 92/64 | HR 72 | Ht 68.0 in | Wt 187.2 lb

## 2012-07-10 DIAGNOSIS — R6889 Other general symptoms and signs: Secondary | ICD-10-CM

## 2012-07-10 DIAGNOSIS — K219 Gastro-esophageal reflux disease without esophagitis: Secondary | ICD-10-CM

## 2012-07-10 MED ORDER — ESOMEPRAZOLE MAGNESIUM 40 MG PO CPDR: 40.0000 mg | DELAYED_RELEASE_CAPSULE | Freq: Two times a day (BID) | ORAL | Status: DC

## 2012-07-10 NOTE — Progress Notes (Signed)
[name] 10-Aug-1946 MRN [id number]   History of Present Illness:  This is a 66 year old white female with chronic gastroesophageal reflux refractory to Nexium 40 mg a day and Carafate slurry. An upper endoscopy in April 2012 showed a 3-4 cm hiatal hernia with a mild nonobstructing stricture. Biopsies showed normal mucosa. There was no Barrett's esophagus. She has had a recent exacerbation which has responded to Carafate slurry 10 cc 4 times a day. She complains of early satiety and food regurgitating at night causing her to cough. It wakes her at night. She is afraid that she is going to choke. She takes occasional ibuprofen at night. Patient has been working out 5 times a week but has not lost any weight.   Past Medical History  Diagnosis Date  . Insomnia   . Depression   . Hyperlipemia   . Constipation   . Cough   . GERD (gastroesophageal reflux disease)   . Hypothyroidism   . Renal cyst   . Gallstones   . Elevated liver function tests   . Hiatal hernia   . Esophageal stricture    Past Surgical History  Procedure Date  . Tooth extraction   . Nasal sinus surgery   . Inguinal herniography   . T & a   . Dilation and curettage of uterus   . Cholecystectomy 12/30/10  . US echocardiography 07/17/2006    EF 55-60%  . Cardiovascular stress test 07/19/2006    EF 70%, NO EVIDENCE OF ISCHEMIA    reports that she has never smoked. She has never used smokeless tobacco. She reports that she does not drink alcohol or use illicit drugs. family history includes Breast cancer in her mother and Dementia in her father.  There is no history of Colon cancer. Allergies  Allergen Reactions  . Codeine   . Hydrocodone-Acetaminophen         Review of Systems: Denies odynophagia. Denies asthma.  The remainder of the 10 point ROS is negative except as outlined in H&P   Physical Exam: General appearance  Well developed, in no distress. Psychological normal mood and affect.  Assessment and  Plan:  Problem #1 Gastroesophageal reflux refractory to proton pump inhibitors. She had presence of a hiatal hernia on a recent upper endoscopy. We need to rule out esophageal dysmotility or gastroparesis resulting in increased reflux. She is on Lexapro which could slow down gastric emptying but she has been on it for many years. Her nocturnal cough suggest LPR. She will increase her Nexium to 40 mg twice a day and start strict antireflux measures. We will proceed with a barium esophagram to assess for motility, reflux as well as gastric emptying depending on the results of the x-ray. She may need a gastric emptying scan. I discussed the use of Reglan especially at night. We have also mentioned a possibility of Nissen fundoplication to reduce hiatal hernia as a surgical treatment of reflux providing she has normal gastric emptying and esophageal motility.   07/10/2012 [name]

## 2012-07-10 NOTE — Patient Instructions (Addendum)
You have been scheduled for a Barium Esophogram and Upper GI at [facility] (1st floor of the hospital) on Friday 07/20/12 at 9:30 am. Please arrive 15 minutes prior to your appointment for registration. Make certain not to have anything to eat or drink 6 hours prior to your test. If you need to reschedule for any reason, please contact radiology at [phone] to do so. Your physician has requested that you go to the basement for the following lab work before leaving today: TSH We have sent the following medications to your pharmacy: Nexium twice daily CC: Dr Creola Corn

## 2012-07-16 DIAGNOSIS — L570 Actinic keratosis: Secondary | ICD-10-CM | POA: Diagnosis not present

## 2012-07-16 DIAGNOSIS — L578 Other skin changes due to chronic exposure to nonionizing radiation: Secondary | ICD-10-CM | POA: Diagnosis not present

## 2012-07-16 DIAGNOSIS — L820 Inflamed seborrheic keratosis: Secondary | ICD-10-CM | POA: Diagnosis not present

## 2012-07-16 DIAGNOSIS — D485 Neoplasm of uncertain behavior of skin: Secondary | ICD-10-CM | POA: Diagnosis not present

## 2012-07-18 ENCOUNTER — Other Ambulatory Visit (HOSPITAL_COMMUNITY): Payer: Medicare Other

## 2012-07-20 ENCOUNTER — Ambulatory Visit (HOSPITAL_COMMUNITY)
Admission: RE | Admit: 2012-07-20 | Discharge: 2012-07-20 | Disposition: A | Discharge status: Home / Self Care | Payer: Medicare Other | Source: Ambulatory Visit | Attending: Internal Medicine | Admitting: Internal Medicine

## 2012-07-20 DIAGNOSIS — IMO0001 Reserved for inherently not codable concepts without codable children: Secondary | ICD-10-CM

## 2012-07-20 DIAGNOSIS — K449 Diaphragmatic hernia without obstruction or gangrene: Secondary | ICD-10-CM | POA: Insufficient documentation

## 2012-07-20 DIAGNOSIS — K219 Gastro-esophageal reflux disease without esophagitis: Secondary | ICD-10-CM | POA: Insufficient documentation

## 2012-07-20 DIAGNOSIS — R933 Abnormal findings on diagnostic imaging of other parts of digestive tract: Secondary | ICD-10-CM | POA: Diagnosis not present

## 2012-08-21 DIAGNOSIS — L570 Actinic keratosis: Secondary | ICD-10-CM | POA: Diagnosis not present

## 2012-08-21 DIAGNOSIS — L259 Unspecified contact dermatitis, unspecified cause: Secondary | ICD-10-CM | POA: Diagnosis not present

## 2012-08-21 DIAGNOSIS — Z85828 Personal history of other malignant neoplasm of skin: Secondary | ICD-10-CM | POA: Diagnosis not present

## 2012-08-21 DIAGNOSIS — L089 Local infection of the skin and subcutaneous tissue, unspecified: Secondary | ICD-10-CM | POA: Diagnosis not present

## 2012-08-21 DIAGNOSIS — B009 Herpesviral infection, unspecified: Secondary | ICD-10-CM | POA: Diagnosis not present

## 2012-09-04 DIAGNOSIS — Z23 Encounter for immunization: Secondary | ICD-10-CM | POA: Diagnosis not present

## 2012-09-18 ENCOUNTER — Encounter: Payer: Self-pay | Admitting: Internal Medicine

## 2012-10-30 ENCOUNTER — Encounter: Payer: Self-pay | Admitting: Internal Medicine

## 2012-11-20 ENCOUNTER — Encounter: Payer: Medicare Other | Admitting: Internal Medicine

## 2012-12-14 ENCOUNTER — Ambulatory Visit (AMBULATORY_SURGERY_CENTER): Payer: Medicare Other | Admitting: *Deleted

## 2012-12-14 VITALS — Ht 68.5 in | Wt 182.6 lb

## 2012-12-14 DIAGNOSIS — Z1211 Encounter for screening for malignant neoplasm of colon: Secondary | ICD-10-CM

## 2012-12-14 MED ORDER — MOVIPREP 100 G PO SOLR: ORAL | Status: DC

## 2012-12-19 ENCOUNTER — Other Ambulatory Visit: Payer: Self-pay | Admitting: *Deleted

## 2012-12-19 MED ORDER — ESOMEPRAZOLE MAGNESIUM 40 MG PO CPDR: 40.0000 mg | DELAYED_RELEASE_CAPSULE | Freq: Two times a day (BID) | ORAL | Status: DC

## 2012-12-25 ENCOUNTER — Emergency Department (HOSPITAL_COMMUNITY): Payer: Medicare Other

## 2012-12-25 ENCOUNTER — Ambulatory Visit (AMBULATORY_SURGERY_CENTER): Payer: Medicare Other | Admitting: Internal Medicine

## 2012-12-25 ENCOUNTER — Other Ambulatory Visit: Payer: Self-pay | Admitting: *Deleted

## 2012-12-25 ENCOUNTER — Telehealth: Payer: Self-pay | Admitting: *Deleted

## 2012-12-25 ENCOUNTER — Encounter (HOSPITAL_COMMUNITY): Payer: Self-pay | Admitting: *Deleted

## 2012-12-25 ENCOUNTER — Ambulatory Visit: Payer: Medicare Other | Admitting: Internal Medicine

## 2012-12-25 ENCOUNTER — Emergency Department (HOSPITAL_COMMUNITY)
Admission: EM | Admit: 2012-12-25 | Discharge: 2012-12-25 | Disposition: A | Discharge status: Home / Self Care | Payer: Medicare Other | Attending: Emergency Medicine | Admitting: Emergency Medicine

## 2012-12-25 ENCOUNTER — Telehealth: Payer: Self-pay | Admitting: Internal Medicine

## 2012-12-25 ENCOUNTER — Encounter: Payer: Self-pay | Admitting: Internal Medicine

## 2012-12-25 VITALS — BP 136/83 | HR 74 | Temp 99.1°F | Resp 15 | Ht 68.5 in | Wt 182.6 lb

## 2012-12-25 DIAGNOSIS — Z9889 Other specified postprocedural states: Secondary | ICD-10-CM | POA: Insufficient documentation

## 2012-12-25 DIAGNOSIS — E039 Hypothyroidism, unspecified: Secondary | ICD-10-CM | POA: Diagnosis not present

## 2012-12-25 DIAGNOSIS — Z87718 Personal history of other specified (corrected) congenital malformations of genitourinary system: Secondary | ICD-10-CM | POA: Insufficient documentation

## 2012-12-25 DIAGNOSIS — Z1211 Encounter for screening for malignant neoplasm of colon: Secondary | ICD-10-CM | POA: Diagnosis not present

## 2012-12-25 DIAGNOSIS — E876 Hypokalemia: Secondary | ICD-10-CM | POA: Diagnosis not present

## 2012-12-25 DIAGNOSIS — F3289 Other specified depressive episodes: Secondary | ICD-10-CM | POA: Insufficient documentation

## 2012-12-25 DIAGNOSIS — IMO0002 Reserved for concepts with insufficient information to code with codable children: Secondary | ICD-10-CM | POA: Diagnosis not present

## 2012-12-25 DIAGNOSIS — J189 Pneumonia, unspecified organism: Secondary | ICD-10-CM

## 2012-12-25 DIAGNOSIS — K219 Gastro-esophageal reflux disease without esophagitis: Secondary | ICD-10-CM | POA: Insufficient documentation

## 2012-12-25 DIAGNOSIS — Z9079 Acquired absence of other genital organ(s): Secondary | ICD-10-CM | POA: Insufficient documentation

## 2012-12-25 DIAGNOSIS — R05 Cough: Secondary | ICD-10-CM | POA: Insufficient documentation

## 2012-12-25 DIAGNOSIS — R059 Cough, unspecified: Secondary | ICD-10-CM | POA: Diagnosis not present

## 2012-12-25 DIAGNOSIS — F329 Major depressive disorder, single episode, unspecified: Secondary | ICD-10-CM | POA: Diagnosis not present

## 2012-12-25 DIAGNOSIS — E785 Hyperlipidemia, unspecified: Secondary | ICD-10-CM | POA: Insufficient documentation

## 2012-12-25 DIAGNOSIS — Z8719 Personal history of other diseases of the digestive system: Secondary | ICD-10-CM | POA: Diagnosis not present

## 2012-12-25 DIAGNOSIS — Z7982 Long term (current) use of aspirin: Secondary | ICD-10-CM | POA: Diagnosis not present

## 2012-12-25 DIAGNOSIS — R509 Fever, unspecified: Secondary | ICD-10-CM | POA: Insufficient documentation

## 2012-12-25 DIAGNOSIS — Z87440 Personal history of urinary (tract) infections: Secondary | ICD-10-CM | POA: Diagnosis not present

## 2012-12-25 DIAGNOSIS — Z79899 Other long term (current) drug therapy: Secondary | ICD-10-CM | POA: Insufficient documentation

## 2012-12-25 LAB — CBC
MCHC: 34.1 g/dL (ref 30.0–36.0)
Platelets: 252 10*3/uL (ref 150–400)
RDW: 13.1 % (ref 11.5–15.5)
WBC: 14.4 10*3/uL — ABNORMAL HIGH (ref 4.0–10.5)

## 2012-12-25 LAB — URINALYSIS, ROUTINE W REFLEX MICROSCOPIC
Glucose, UA: NEGATIVE mg/dL
Ketones, ur: NEGATIVE mg/dL
Leukocytes, UA: NEGATIVE
Protein, ur: NEGATIVE mg/dL
Urobilinogen, UA: 0.2 mg/dL (ref 0.0–1.0)

## 2012-12-25 LAB — BASIC METABOLIC PANEL
BUN: 7 mg/dL (ref 6–23)
Chloride: 105 mEq/L (ref 96–112)
GFR calc Af Amer: >90 mL/min (ref >90)
GFR calc non Af Amer: 88 mL/min — ABNORMAL LOW (ref >90)
Potassium: 3.2 mEq/L — ABNORMAL LOW (ref 3.5–5.1)

## 2012-12-25 LAB — URINE MICROSCOPIC-ADD ON

## 2012-12-25 LAB — POCT I-STAT TROPONIN I: Troponin i, poc: 0 ng/mL (ref 0.00–0.08)

## 2012-12-25 MED ORDER — SODIUM CHLORIDE 0.9 % IV SOLN: 500.0000 mL | INTRAVENOUS | Status: DC

## 2012-12-25 MED ORDER — LEVOFLOXACIN 500 MG PO TABS
750.0000 mg | ORAL_TABLET | Freq: Once | ORAL | Status: AC
  Administered 2012-12-25: 750 mg via ORAL
  Filled 2012-12-25: qty 2

## 2012-12-25 MED ORDER — POTASSIUM CHLORIDE CRYS ER 20 MEQ PO TBCR
40.0000 meq | EXTENDED_RELEASE_TABLET | Freq: Once | ORAL | Status: AC
  Administered 2012-12-25: 40 meq via ORAL
  Filled 2012-12-25 (×2): qty 1

## 2012-12-25 MED ORDER — LEVOFLOXACIN 750 MG PO TABS: 750.0000 mg | ORAL_TABLET | Freq: Once | ORAL | Status: DC

## 2012-12-25 MED ORDER — SODIUM CHLORIDE 0.9 % IV BOLUS (SEPSIS): 1000.0000 mL | Freq: Once | INTRAVENOUS | Status: DC

## 2012-12-25 NOTE — Patient Instructions (Addendum)

## 2012-12-25 NOTE — Op Note (Addendum)
[facility] [address] [place], [address]   COLONOSCOPY PROCEDURE REPORT  PATIENT: Angela, Charles  MR#: [id number] BIRTHDATE: 07-21-1946 , 66  yrs. old GENDER: Female ENDOSCOPIST: Hart Carwin, MD REFERRED NW:[facility] Timothy Lasso, M.D. PROCEDURE DATE:  12/25/2012 PROCEDURE:   Colonoscopy, screening ASA CLASS:   Class II INDICATIONS:Average risk patient for colon cancer and last colon 2003 was normal. MEDICATIONS: MAC sedation, administered by CRNA and propofol (Diprivan) 250mg  IV  DESCRIPTION OF PROCEDURE:   After the risks benefits and alternatives of the procedure were thoroughly explained, informed consent was obtained.  A digital rectal exam revealed no abnormalities of the rectum.   The LB [id number]  endoscope was introduced through the anus and advanced to the cecum, which was identified by both the appendix and ileocecal valve. No adverse events experienced.   The quality of the prep was good, using MoviPrep  The instrument was then slowly withdrawn as the colon was fully examined.      COLON FINDINGS: A normal appearing cecum, ileocecal valve, and appendiceal orifice were identified.  The ascending, hepatic flexure, transverse, splenic flexure, descending, sigmoid colon and rectum appeared unremarkable.  No polyps or cancers were seen. Retroflexed views revealed no abnormalities. The time to cecum=8 minutes 58 seconds.  Withdrawal time=6 minutes 17 seconds.  The scope was withdrawn and the procedure completed. COMPLICATIONS: There were no complications.  ENDOSCOPIC IMPRESSION: Normal colon  RECOMMENDATIONS: High fiber diet Recall colonoscopy in 10 years  eSigned:  Hart Carwin, MD 12/25/2012 8:52 AM Revised: 12/25/2012 8:52 AM  cc:

## 2012-12-25 NOTE — Progress Notes (Signed)
Patient did not experience any of the following events: a burn prior to discharge; a fall within the facility; wrong site/side/patient/procedure/implant event; or a hospital transfer or hospital admission upon discharge from the facility. (G8907) Patient did not have preoperative order for IV antibiotic SSI prophylaxis. (G8918)  

## 2012-12-25 NOTE — ED Notes (Signed)
Pt alert and oriented x4. Respirations even and unlabored, bilateral symmetrical rise and fall of chest. Skin warm and dry. In no acute distress. Denies needs.   

## 2012-12-25 NOTE — Telephone Encounter (Signed)
Husband called and said wife was having severe chills and shaking, ask husband to take temperature, temp was 96.0, spoke with Dr. Juanda Chance, she advised to take Ibuprofen and come in at 1300 if no relief, Larita Fife CRNA also spoke with husband in same phone call and advised to give wife 400 mg Ibuprofen and call back in 1 hour, husband did not want to do that, Larita Fife spoke with Dr. Juanda Chance, advised husband to bring wife back to office at 1300, wife does not want to come back in, again we emphasized that Dr. Juanda Chance wanted to see her at 25

## 2012-12-25 NOTE — ED Provider Notes (Signed)
History     CSN: [id number]  Arrival date & time 12/25/12  1511   First MD Initiated Contact with Patient 12/25/12 1553      Chief Complaint  Patient presents with  . Chest Pain  . s/p colonoscopy     (Consider location/radiation/quality/duration/timing/severity/associated sxs/prior treatment) HPI Comments: [name] is a 67 y.o. Female who is here for evaluation of cough, fever, and chest pain. The discomfort started several hours after colonoscopy, done this morning. There were no noted complications with the procedure. The procedure was routine followup. She took ibuprofen, and Tylenol for the fever. She has not eaten since yesterday. She contacted her GI physician, who recommended an ER evaluation. The cough is nonproductive. She has no sick contacts. She had a flu vaccine at the beginning of this season. There are no modifying factors.  Patient is a 67 y.o. female presenting with chest pain. The history is provided by the patient.  Chest Pain     Past Medical History  Diagnosis Date  . Insomnia   . Depression   . Hyperlipemia   . Constipation   . Cough   . GERD (gastroesophageal reflux disease)   . Hypothyroidism   . Renal cyst   . Gallstones   . Elevated liver function tests   . Hiatal hernia   . Esophageal stricture     Past Surgical History  Procedure Date  . Tooth extraction   . Nasal sinus surgery   . Inguinal herniography   . T & a   . Dilation and curettage of uterus   . Cholecystectomy 12/30/10  . US echocardiography 07/17/2006    EF 55-60%  . Cardiovascular stress test 07/19/2006    EF 70%, NO EVIDENCE OF ISCHEMIA    Family History  Problem Relation Age of Onset  . Breast cancer Mother   . Colon cancer Neg Hx   . Stomach cancer Neg Hx   . Dementia Father     History  Substance Use Topics  . Smoking status: Never Smoker   . Smokeless tobacco: Never Used  . Alcohol Use: No    OB History    Grav Para Term Preterm Abortions TAB SAB Ect Mult  Living                  Review of Systems  Cardiovascular: Positive for chest pain.  All other systems reviewed and are negative.    Allergies  Codeine and Hydrocodone-acetaminophen  Home Medications   Current Outpatient Rx  Name  Route  Sig  Dispense  Refill  . ASPIRIN EC 81 MG PO TBEC   Oral   Take 81 mg by mouth daily.         . B COMPLEX-C PO TABS   Oral   Take 1 tablet by mouth daily.         Marland Kitchen BLACK COHOSH 540 MG PO CAPS   Oral   Take 1 capsule by mouth daily.          . BUDESONIDE 32 MCG/ACT NA SUSP   Nasal   Place 2 sprays into the nose daily. 1 spray in each nostril.         . CHOLECALCIFEROL 2000 UNITS PO CAPS   Oral   Take 1 capsule by mouth daily.          . DESLORATADINE 5 MG PO TABS   Oral   Take 1 tablet (5 mg total) by mouth daily.   90  tablet   PRN   . ESCITALOPRAM OXALATE 10 MG PO TABS   Oral   Take 10 mg by mouth daily.           Marland Kitchen ESOMEPRAZOLE MAGNESIUM 40 MG PO CPDR   Oral   Take 40 mg by mouth daily before breakfast.         . EZETIMIBE 10 MG PO TABS   Oral   Take 1 tablet (10 mg total) by mouth daily.   90 tablet   3   . LEVOTHYROXINE SODIUM 50 MCG PO TABS   Oral   Take 1 tablet (50 mcg total) by mouth daily.   90 tablet   3   . MAGNESIUM 250 MG PO TABS   Oral   Take 1 tablet by mouth daily.          . RED YEAST RICE PO   Oral   Take 1 capsule by mouth daily.          Marland Kitchen ROSUVASTATIN CALCIUM 10 MG PO TABS   Oral   Take 5 mg by mouth daily.         Marland Kitchen ZOLPIDEM TARTRATE 10 MG PO TABS   Oral   Take 10 mg by mouth at bedtime as needed. For sleep.         Marland Kitchen LEVOFLOXACIN 750 MG PO TABS   Oral   Take 1 tablet (750 mg total) by mouth once.   9 tablet   0     BP 127/67  Pulse 105  Temp 98.5 F (36.9 C) (Oral)  Resp 16  SpO2 94%  Physical Exam  Nursing note and vitals reviewed. Constitutional: She is oriented to person, place, and time. She appears well-developed and well-nourished.    HENT:  Head: Normocephalic and atraumatic.  Eyes: Conjunctivae normal and EOM are normal. Pupils are equal, round, and reactive to light.  Neck: Normal range of motion and phonation normal. Neck supple.  Cardiovascular: Normal rate, regular rhythm and intact distal pulses.   Pulmonary/Chest: Effort normal. No respiratory distress. She has rales (left base). She exhibits no tenderness.       Decreased breath sounds left base  Abdominal: Soft. She exhibits distension (Mild). She exhibits no mass. There is tenderness (Mild epigastric). There is no rebound and no guarding.  Musculoskeletal: Normal range of motion.  Neurological: She is alert and oriented to person, place, and time. She has normal strength. She exhibits normal muscle tone.  Skin: Skin is warm and dry.  Psychiatric: She has a normal mood and affect. Her behavior is normal. Judgment and thought content normal.    ED Course  Procedures (including critical care time)  Emergency department treatment: Levaquin, potassium oral  Repeat vital signs, 17:24, normal, except heart rate 105, but this improved.   Date: 09/28/2012  Rate: 111  Rhythm: sinus tachycardia  QRS Axis: left  PR and QT Intervals: normal  ST/T Wave abnormalities: nonspecific T wave changes  PR and QRS Conduction Disutrbances:none  Narrative Interpretation:   Old EKG Reviewed: none available   Labs Reviewed  CBC - Abnormal; Notable for the following:    WBC 14.4 (*)     All other components within normal limits  BASIC METABOLIC PANEL - Abnormal; Notable for the following:    Potassium 3.2 (*)     Glucose, Bld 132 (*)     GFR calc non Af Amer 88 (*)     All other components within normal limits  POCT I-STAT TROPONIN I  URINALYSIS, ROUTINE W REFLEX MICROSCOPIC  URINE CULTURE   Dg Chest 1 View  12/25/2012  *RADIOLOGY REPORT*  Clinical Data: Chest pain, cough.  CHEST - 1 VIEW  Comparison: 07/22/2010  Findings: Consolidation noted in the left mid and  lower lung compatible with pneumonia.  No effusions.  Right lung is clear. Heart is normal size.  No acute bony abnormality.  IMPRESSION: Left mid and lower lung pneumonia.   Original Report Authenticated By: Charlett Nose, M.D.    Nursing notes, applicable records and vitals reviewed.  Radiologic Images/Reports reviewed.   1. Community acquired pneumonia   2. Hypokalemia       MDM  Evaluation consistent with mucoid pneumonia. Doubt surgical complication. Doubt aspiration pneumonia. Hypokalemia is incidental. She's not on potassium, leaching medication . Doubt metabolic instability, serious bacterial infection or impending vascular collapse; the patient is stable for discharge.      Plan: Home Medications- Levaquin; Home Treatments- rest, fluids; Recommended follow up- PCP 1 week      Flint Melter, MD 12/25/12 1759

## 2012-12-25 NOTE — ED Notes (Signed)
Pt escorted to discharge window. Pt verbalized understanding discharge instructions. In no acute distress.  

## 2012-12-25 NOTE — Telephone Encounter (Signed)
Spoke with patient's husband to request she go to xray also for CXR prior to office visit. He states she is not getting any better. He states she is not mobile and he will need help to get her into building. When he was questioned more about her condition, he and patient describe her pain as sternal with each deep breath. Denies abdominal pain. He states her feet are pale but warm.(he states she has on make up so he isn't sure if she her face is pale.) He states she is very weak. He states she is coughing. No chills at this point. She is under 2 blankets and has a hot water bottle also. She continuous to have fever. Discussed with Dr. Juanda Chance and called Mr. Fanton back and advised she go now to ED for evaluation.

## 2012-12-25 NOTE — Telephone Encounter (Signed)
Spoke with Dr. Juanda Chance before calling pt back,advised Dr. Juanda Chance that pt is now febrile, temp over 100, Dr. Juanda Chance called pt back and spoke to husband, Dr. Algis Downs pt to come in at 1600 so she could exam her. Pt was reluctant, Dr. Juanda Chance explained it was important for her to see pt to determine if an antibiotic was needed and what kind, husband agreed that if pt was not feeling better by 1600 they would come in, Dr. Juanda Chance called down to office and ordered labs for pt when she arrives at 1600, no need for nurse to call since Dr. Juanda Chance made call.

## 2012-12-25 NOTE — Progress Notes (Signed)
Stable to RR 

## 2012-12-25 NOTE — ED Notes (Addendum)
Pt had colonoscopy this am, reports upper abdominal pain, chills, fever 1.5 hours after colonoscopy.   Reports pain 9/10 upon deep inspiration

## 2012-12-25 NOTE — ED Notes (Signed)
md brodie called and requested pt have cbc and chest xray performed. md reported pt was coughing during procedure

## 2012-12-26 ENCOUNTER — Telehealth: Payer: Self-pay

## 2012-12-26 LAB — URINE CULTURE
Colony Count: NO GROWTH
Culture: NO GROWTH

## 2012-12-26 NOTE — Telephone Encounter (Signed)
  Follow up Call-  Call back number 12/25/2012 03/25/2011  Post procedure Call Back phone  # [phone]  Permission to leave phone message Yes -     Patient questions:  Do you have a fever, pain , or abdominal swelling? yes Pain Score  0 *  Have you tolerated food without any problems? yes  Have you been able to return to your normal activities? yes  Do you have any questions about your discharge instructions: Diet   no Medications  no Follow up visit  no  Do you have questions or concerns about your Care? no  Actions: * If pain score is 4 or above: No action needed, pain <4.  Patient went to ER after went home due to fever and chills. Dx with pneumonia and sent home on antibiotic therapy.

## 2012-12-27 ENCOUNTER — Telehealth: Payer: Self-pay | Admitting: Internal Medicine

## 2012-12-27 ENCOUNTER — Other Ambulatory Visit (INDEPENDENT_AMBULATORY_CARE_PROVIDER_SITE_OTHER): Payer: Medicare Other

## 2012-12-27 ENCOUNTER — Ambulatory Visit (INDEPENDENT_AMBULATORY_CARE_PROVIDER_SITE_OTHER)
Admission: RE | Admit: 2012-12-27 | Discharge: 2012-12-27 | Disposition: A | Discharge status: Home / Self Care | Payer: Medicare Other | Source: Ambulatory Visit | Attending: Internal Medicine | Admitting: Internal Medicine

## 2012-12-27 DIAGNOSIS — J189 Pneumonia, unspecified organism: Secondary | ICD-10-CM

## 2012-12-27 LAB — CBC WITH DIFFERENTIAL/PLATELET
Basophils Absolute: 0 10*3/uL (ref 0.0–0.1)
Eosinophils Absolute: 0.7 10*3/uL (ref 0.0–0.7)
Lymphocytes Relative: 24.4 % (ref 12.0–46.0)
MCHC: 33.5 g/dL (ref 30.0–36.0)
Monocytes Relative: 6.4 % (ref 3.0–12.0)
Neutrophils Relative %: 63.8 % (ref 43.0–77.0)
RDW: 13.6 % (ref 11.5–14.6)

## 2012-12-27 NOTE — Telephone Encounter (Signed)
Per Dr. Juanda Chance, get CXR and CBC today. Make sure patient is taking Advil or Tylenol for fever. May alternate the two. Schedule with extender tomorrow. Spoke with patient's husband and she will come for lab/xray today. She will alternate Advil/Tylenol prn. Scheduled with Doug Sou tomorrow at 10:30 AM.

## 2012-12-28 ENCOUNTER — Ambulatory Visit (INDEPENDENT_AMBULATORY_CARE_PROVIDER_SITE_OTHER): Payer: Medicare Other | Admitting: Gastroenterology

## 2012-12-28 ENCOUNTER — Encounter: Payer: Self-pay | Admitting: Gastroenterology

## 2012-12-28 ENCOUNTER — Other Ambulatory Visit: Payer: Self-pay | Admitting: *Deleted

## 2012-12-28 VITALS — BP 120/60 | HR 70 | Temp 97.9°F | Ht 68.5 in | Wt 180.9 lb

## 2012-12-28 DIAGNOSIS — J690 Pneumonitis due to inhalation of food and vomit: Secondary | ICD-10-CM | POA: Insufficient documentation

## 2012-12-28 DIAGNOSIS — J189 Pneumonia, unspecified organism: Secondary | ICD-10-CM

## 2012-12-28 MED ORDER — ESOMEPRAZOLE MAGNESIUM 40 MG PO CPDR: 40.0000 mg | DELAYED_RELEASE_CAPSULE | Freq: Two times a day (BID) | ORAL | Status: DC

## 2012-12-28 NOTE — Progress Notes (Signed)
Reviewed and agree.

## 2012-12-28 NOTE — Progress Notes (Signed)
12/28/2012 [name] [id number] [birth year]   History of Present Illness: Patient is a 67 year old female who underwent colonoscopy on 1/14 by Dr. Juanda Chance.  After returning home from the procedure she began spiking a fever, having chills, coughing, and having substernal chest pain. It was very severe so she was sent to the ED.  Chest x-ray showed LLL PNA and she had an elevated WBC count.  She was given levaquin and discharged home.  Still was not feeling well yesterday so Dr. Juanda Chance asked that she be seen in the office today.  She says that today she is actually feeling a little better.  She is still taking tylenol regularly so she does not know if she is still running a fever.  CBC yesterday showed that her WBC count was trending down, and her chest x-ray showed some improvement in the infiltrate.    Apparently she was coughing during the procedure.  ? Aspiration pneumonia.  Current Medications, Allergies, Past Medical History, Past Surgical History, Family History and Social History were reviewed in [employer] record.   Physical Exam: BP 120/60  Pulse 70  Temp 97.9 F (36.6 C)  Ht 5' 8.5" (1.74 m)  Wt 180 lb 14.4 oz (82.056 kg)  BMI 27.11 kg/m2 General: Well developed, white female in no acute distress Head: Normocephalic and atraumatic Eyes:  sclerae anicteric, conjunctiva pink  Ears: Normal auditory acuity Lungs: Clear throughout to auscultation; slight decreased BS's in left lung. Heart: Regular rate and rhythm Musculoskeletal: Symmetrical with no gross deformities  Extremities: No edema  Neurological: Alert oriented x 4, grossly nonfocal Psychological:  Alert and cooperative. Normal mood and affect  Assessment and Recommendations: *LLL PNA:  ? Aspiration during colonoscopy.  On Levaquin and improving.  Will continue to 10 day course of abx as well as tylenol as needed for fever.  Will call back if there are any other problems

## 2012-12-28 NOTE — Patient Instructions (Addendum)
Continue the Tylenol and antibiotics.  Call us if you have any other problems @ [phone], choose option 2 for nurse.

## 2013-01-01 ENCOUNTER — Institutional Professional Consult (permissible substitution): Payer: Medicare Other | Admitting: Internal Medicine

## 2013-01-01 DIAGNOSIS — M25569 Pain in unspecified knee: Secondary | ICD-10-CM | POA: Diagnosis not present

## 2013-01-02 ENCOUNTER — Telehealth: Payer: Self-pay | Admitting: Internal Medicine

## 2013-01-02 NOTE — Telephone Encounter (Signed)
I spoke with pt. She had a consult appt with [facility] on 01/01/13 for f/u PNA referred Dr. Juanda Chance. She stated no one called her about this appt date/time. She stated Dr. Juanda Chance advised her we would call her with her appt and no one ever did. She does not want to wait for Dr. Roxy Cedar next available in February since this is for f/u PNA. No sooner appts either with any other physicians. Please advise Dr. Maple Hudson thanks

## 2013-01-02 NOTE — Telephone Encounter (Signed)
Per CY-okay to add patient to Thursday 01-03-13 at 11:15am. Pt is aware of appt and requested we check her K+ level so she can make this appt(otherwise she will need another day as she has appt at PCP for K+ level checked). CY stated this was okay to have checked her at patients OV tomorrow. Pt is aware and will be here at 11:15am to see CY.

## 2013-01-03 ENCOUNTER — Ambulatory Visit (INDEPENDENT_AMBULATORY_CARE_PROVIDER_SITE_OTHER)
Admission: RE | Admit: 2013-01-03 | Discharge: 2013-01-03 | Disposition: A | Discharge status: Home / Self Care | Payer: Medicare Other | Source: Ambulatory Visit | Attending: Internal Medicine | Admitting: Internal Medicine

## 2013-01-03 ENCOUNTER — Other Ambulatory Visit: Payer: Self-pay | Admitting: Internal Medicine

## 2013-01-03 ENCOUNTER — Encounter: Payer: Self-pay | Admitting: Internal Medicine

## 2013-01-03 ENCOUNTER — Ambulatory Visit
Admission: RE | Admit: 2013-01-03 | Discharge: 2013-01-03 | Disposition: A | Discharge status: Home / Self Care | Payer: Medicare Other | Source: Ambulatory Visit | Attending: Internal Medicine | Admitting: Internal Medicine

## 2013-01-03 ENCOUNTER — Ambulatory Visit (INDEPENDENT_AMBULATORY_CARE_PROVIDER_SITE_OTHER): Payer: Medicare Other | Admitting: Internal Medicine

## 2013-01-03 VITALS — BP 126/78 | HR 71 | Ht 68.5 in | Wt 183.0 lb

## 2013-01-03 DIAGNOSIS — R509 Fever, unspecified: Secondary | ICD-10-CM

## 2013-01-03 DIAGNOSIS — G4700 Insomnia, unspecified: Secondary | ICD-10-CM | POA: Diagnosis not present

## 2013-01-03 DIAGNOSIS — R05 Cough: Secondary | ICD-10-CM

## 2013-01-03 DIAGNOSIS — J690 Pneumonitis due to inhalation of food and vomit: Secondary | ICD-10-CM

## 2013-01-03 DIAGNOSIS — E039 Hypothyroidism, unspecified: Secondary | ICD-10-CM | POA: Diagnosis not present

## 2013-01-03 NOTE — Patient Instructions (Addendum)
Order- CXR   Dx aspiration pneumonia  Ask Dr Ferd Hibbs office to draw CBC today, when they draw your other tests, since you just had pneumonia.  You should discuss your experience with your surgeon and anesthesiologist before future procedures   Please call as needed

## 2013-01-03 NOTE — Progress Notes (Signed)
01/03/13- 84 yoF never smoker referred courtesy of Dr Juanda Chance, concerned about pneumonia after colonoscopy.   PCP Dr Timothy Lasso Known hiatal hernia with history of waking choked by reflux events, reduced by reflux precautions and acid blocker.  One prior pneumonia as a child. She was in left decub position for colonoscopy 12/25/12 after drinking last of her prep 3 hours before. Noted to cough during the procedure. Went to ER later that day with left mid and lower lobe pneumonia. Treated Levaquin 10 days- last dose today. Initial productive cough, fever and chills have resolved.  She notes significant improvement,and is more concerned now about risk with future procedures. She is seeing Dr Despina Hick about potential knee surgery. Husband has sleep apnea and has commented on her snoring. We discussed sleep apnea and airway protection after surgery. She wtw on any eval for this.   We reviewed the images and reports of her 2 recent CXRs. CXR 1/16/14IMPRESSION:  Slight improvement in aeration. Persistent mild residual  infiltrate in left lower lobe. No new infiltrate. Small hiatal  hernia.  Original Report Authenticated By: Natasha Mead, M.D.  -  Prior to Admission medications   Medication Sig Start Date End Date Taking? Authorizing Provider  aspirin EC 81 MG tablet Take 81 mg by mouth daily.   Yes Historical Provider, MD  B Complex-C (B-COMPLEX WITH VITAMIN C) tablet Take 1 tablet by mouth daily.   Yes Historical Provider, MD  Black Cohosh 540 MG CAPS Take 1 capsule by mouth daily.    Yes Historical Provider, MD  budesonide (RHINOCORT AQUA) 32 MCG/ACT nasal spray Place 2 sprays into the nose daily. 1 spray in each nostril.   Yes Historical Provider, MD  Cholecalciferol (HM VITAMIN D3) 2000 UNITS CAPS Take 1 capsule by mouth daily.    Yes Historical Provider, MD  desloratadine (CLARINEX) 5 MG tablet Take 1 tablet (5 mg total) by mouth daily. 01/17/12 01/16/13 Yes Cassell Clement, MD  escitalopram (LEXAPRO) 10 MG  tablet Take 10 mg by mouth daily.     Yes Historical Provider, MD  esomeprazole (NEXIUM) 40 MG capsule Take 40 mg by mouth daily before breakfast. 12/28/12  Yes Hart Carwin, MD  ezetimibe (ZETIA) 10 MG tablet Take 1 tablet (10 mg total) by mouth daily. 05/09/12  Yes Cassell Clement, MD  levofloxacin (LEVAQUIN) 750 MG tablet Take 1 tablet (750 mg total) by mouth once. 12/25/12  Yes Flint Melter, MD  levothyroxine (SYNTHROID, LEVOTHROID) 50 MCG tablet Take 1 tablet (50 mcg total) by mouth daily. 04/06/12  Yes Cassell Clement, MD  Magnesium 250 MG TABS Take 1 tablet by mouth daily.    Yes Historical Provider, MD  Red Yeast Rice Extract (RED YEAST RICE PO) Take 1 capsule by mouth daily.    Yes Historical Provider, MD  rosuvastatin (CRESTOR) 10 MG tablet Take 5 mg by mouth daily.   Yes Historical Provider, MD  zolpidem (AMBIEN) 10 MG tablet Take 10 mg by mouth at bedtime as needed. For sleep.   Yes Historical Provider, MD   Past Medical History  Diagnosis Date  . Insomnia   . Depression   . Hyperlipemia   . Constipation   . Cough   . GERD (gastroesophageal reflux disease)   . Hypothyroidism   . Renal cyst   . Gallstones   . Elevated liver function tests   . Hiatal hernia   . Esophageal stricture    Past Surgical History  Procedure Date  . Tooth extraction   .  Nasal sinus surgery   . Inguinal herniography   . T & a   . Dilation and curettage of uterus   . Cholecystectomy 12/30/10  . US echocardiography 07/17/2006    EF 55-60%  . Cardiovascular stress test 07/19/2006    EF 70%, NO EVIDENCE OF ISCHEMIA  . Colonoscopy    Family History  Problem Relation Age of Onset  . Breast cancer Mother   . Colon cancer Neg Hx   . Stomach cancer Neg Hx   . Dementia Father    History   Social History  . Marital Status: Married    Spouse Name: N/A    Number of Children: 2  . Years of Education: N/A   Occupational History  . Retired    Social History Main Topics  . Smoking status: Never  Smoker   . Smokeless tobacco: Never Used  . Alcohol Use: No  . Drug Use: No  . Sexually Active: Not on file   Other Topics Concern  . Not on file   Social History Narrative  . No narrative on file   ROS-see HPI Constitutional:   No- current  weight loss, night sweats, fevers, chills, fatigue, lassitude. HEENT:   No-  headaches, difficulty swallowing, tooth/dental problems, sore throat,       No-  sneezing, itching, ear ache, nasal congestion, post nasal drip,  CV:  No-   chest pain, orthopnea, PND, swelling in lower extremities, anasarca,                                  dizziness, palpitations Resp: +Slight shortness of breath with exertion or at rest.              No-   productive cough,  + non-productive cough,  No- coughing up of blood.              No-   change in color of mucus.  No- wheezing.   Skin: No-   rash or lesions. GI:  No-   heartburn, indigestion, abdominal pain, nausea, vomiting, diarrhea,                 change in bowel habits, loss of appetite GU: No-   dysuria, change in color of urine, no urgency or frequency.  No- flank pain. MS:  No-   joint pain or swelling.  No- decreased range of motion.  No- back pain. Neuro-     nothing unusual Psych:  No- change in mood or affect. No depression or anxiety.  No memory loss.  OBJ- Physical Exam General- Alert, Oriented, Affect-appropriate, Distress- none acute Skin- rash-none, lesions- none, excoriation- none Lymphadenopathy- none Head- atraumatic            Eyes- Gross vision intact, PERRLA, conjunctivae and secretions clear            Ears- Hearing, canals-normal            Nose- Clear, no-Septal dev, mucus, polyps, erosion, perforation             Throat- Mallampati III , mucosa clear , drainage- none, tonsils- atrophic Neck- flexible , trachea midline, no stridor , thyroid nl, carotid no bruit Chest - symmetrical excursion , unlabored           Heart/CV- RRR , no murmur , no gallop  , no rub, nl s1 s2                            -  JVD- none , edema- none, stasis changes- none, varices- none           Lung- + few crackles left base and lateral, wheeze- none, cough- none , dullness-none, rub- none           Chest wall-  Abd- tender-no, distended-no, bowel sounds-present, HSM- no Br/ Gen/ Rectal- Not done, not indicated Extrem- cyanosis- none, clubbing, none, atrophy- none, strength- nl Neuro- grossly intact to observation

## 2013-01-07 DIAGNOSIS — M25569 Pain in unspecified knee: Secondary | ICD-10-CM | POA: Diagnosis not present

## 2013-01-08 ENCOUNTER — Telehealth: Payer: Self-pay | Admitting: Internal Medicine

## 2013-01-08 NOTE — Progress Notes (Signed)
Quick Note:  Pt aware of results. ______ 

## 2013-01-08 NOTE — Telephone Encounter (Signed)
Error.[name] ° °

## 2013-01-14 DIAGNOSIS — M2240 Chondromalacia patellae, unspecified knee: Secondary | ICD-10-CM | POA: Diagnosis not present

## 2013-01-23 DIAGNOSIS — H9209 Otalgia, unspecified ear: Secondary | ICD-10-CM | POA: Diagnosis not present

## 2013-01-28 DIAGNOSIS — E785 Hyperlipidemia, unspecified: Secondary | ICD-10-CM | POA: Diagnosis not present

## 2013-01-28 DIAGNOSIS — R82998 Other abnormal findings in urine: Secondary | ICD-10-CM | POA: Diagnosis not present

## 2013-01-28 DIAGNOSIS — E039 Hypothyroidism, unspecified: Secondary | ICD-10-CM | POA: Diagnosis not present

## 2013-01-29 DIAGNOSIS — M25569 Pain in unspecified knee: Secondary | ICD-10-CM | POA: Diagnosis not present

## 2013-01-30 DIAGNOSIS — R7309 Other abnormal glucose: Secondary | ICD-10-CM | POA: Diagnosis not present

## 2013-01-31 DIAGNOSIS — N951 Menopausal and female climacteric states: Secondary | ICD-10-CM | POA: Diagnosis not present

## 2013-01-31 DIAGNOSIS — E349 Endocrine disorder, unspecified: Secondary | ICD-10-CM | POA: Diagnosis not present

## 2013-01-31 DIAGNOSIS — Z09 Encounter for follow-up examination after completed treatment for conditions other than malignant neoplasm: Secondary | ICD-10-CM | POA: Diagnosis not present

## 2013-01-31 DIAGNOSIS — Z1212 Encounter for screening for malignant neoplasm of rectum: Secondary | ICD-10-CM | POA: Diagnosis not present

## 2013-02-01 DIAGNOSIS — Z0000 Encounter for general adult medical examination without abnormal findings: Secondary | ICD-10-CM | POA: Diagnosis not present

## 2013-02-01 DIAGNOSIS — R928 Other abnormal and inconclusive findings on diagnostic imaging of breast: Secondary | ICD-10-CM | POA: Diagnosis not present

## 2013-02-01 DIAGNOSIS — E039 Hypothyroidism, unspecified: Secondary | ICD-10-CM | POA: Diagnosis not present

## 2013-02-01 DIAGNOSIS — Z1331 Encounter for screening for depression: Secondary | ICD-10-CM | POA: Diagnosis not present

## 2013-02-01 DIAGNOSIS — M25569 Pain in unspecified knee: Secondary | ICD-10-CM | POA: Diagnosis not present

## 2013-02-05 DIAGNOSIS — Z1389 Encounter for screening for other disorder: Secondary | ICD-10-CM | POA: Diagnosis not present

## 2013-02-07 DIAGNOSIS — D235 Other benign neoplasm of skin of trunk: Secondary | ICD-10-CM | POA: Diagnosis not present

## 2013-02-07 DIAGNOSIS — L820 Inflamed seborrheic keratosis: Secondary | ICD-10-CM | POA: Diagnosis not present

## 2013-02-07 DIAGNOSIS — M25569 Pain in unspecified knee: Secondary | ICD-10-CM | POA: Diagnosis not present

## 2013-02-12 DIAGNOSIS — M25569 Pain in unspecified knee: Secondary | ICD-10-CM | POA: Diagnosis not present

## 2013-02-14 DIAGNOSIS — M25569 Pain in unspecified knee: Secondary | ICD-10-CM | POA: Diagnosis not present

## 2013-03-01 DIAGNOSIS — M25569 Pain in unspecified knee: Secondary | ICD-10-CM | POA: Diagnosis not present

## 2013-03-04 DIAGNOSIS — M2240 Chondromalacia patellae, unspecified knee: Secondary | ICD-10-CM | POA: Diagnosis not present

## 2013-03-07 DIAGNOSIS — M2240 Chondromalacia patellae, unspecified knee: Secondary | ICD-10-CM | POA: Diagnosis not present

## 2013-03-12 DIAGNOSIS — M25569 Pain in unspecified knee: Secondary | ICD-10-CM | POA: Diagnosis not present

## 2013-03-14 DIAGNOSIS — M2240 Chondromalacia patellae, unspecified knee: Secondary | ICD-10-CM | POA: Diagnosis not present

## 2013-03-26 DIAGNOSIS — M25569 Pain in unspecified knee: Secondary | ICD-10-CM | POA: Diagnosis not present

## 2013-04-01 ENCOUNTER — Encounter: Payer: Self-pay | Admitting: *Deleted

## 2013-04-16 DIAGNOSIS — M2240 Chondromalacia patellae, unspecified knee: Secondary | ICD-10-CM | POA: Diagnosis not present

## 2013-04-18 DIAGNOSIS — Z79899 Other long term (current) drug therapy: Secondary | ICD-10-CM | POA: Diagnosis not present

## 2013-05-10 ENCOUNTER — Other Ambulatory Visit: Payer: Self-pay | Admitting: *Deleted

## 2013-05-10 DIAGNOSIS — E785 Hyperlipidemia, unspecified: Secondary | ICD-10-CM

## 2013-05-10 MED ORDER — EZETIMIBE 10 MG PO TABS: 10.0000 mg | ORAL_TABLET | Freq: Every day | ORAL | Status: DC

## 2013-05-10 NOTE — Telephone Encounter (Signed)
Ok per  Dr. Brackbill  

## 2013-05-29 DIAGNOSIS — H04129 Dry eye syndrome of unspecified lacrimal gland: Secondary | ICD-10-CM | POA: Diagnosis not present

## 2013-08-01 DIAGNOSIS — Z09 Encounter for follow-up examination after completed treatment for conditions other than malignant neoplasm: Secondary | ICD-10-CM | POA: Diagnosis not present

## 2013-08-01 DIAGNOSIS — Z803 Family history of malignant neoplasm of breast: Secondary | ICD-10-CM | POA: Diagnosis not present

## 2013-08-01 DIAGNOSIS — R928 Other abnormal and inconclusive findings on diagnostic imaging of breast: Secondary | ICD-10-CM | POA: Diagnosis not present

## 2013-08-02 DIAGNOSIS — F329 Major depressive disorder, single episode, unspecified: Secondary | ICD-10-CM | POA: Diagnosis not present

## 2013-08-02 DIAGNOSIS — R928 Other abnormal and inconclusive findings on diagnostic imaging of breast: Secondary | ICD-10-CM | POA: Diagnosis not present

## 2013-08-02 DIAGNOSIS — N951 Menopausal and female climacteric states: Secondary | ICD-10-CM | POA: Diagnosis not present

## 2013-08-02 DIAGNOSIS — E039 Hypothyroidism, unspecified: Secondary | ICD-10-CM | POA: Diagnosis not present

## 2013-08-02 DIAGNOSIS — Z6828 Body mass index (BMI) 28.0-28.9, adult: Secondary | ICD-10-CM | POA: Diagnosis not present

## 2013-08-02 DIAGNOSIS — E785 Hyperlipidemia, unspecified: Secondary | ICD-10-CM | POA: Diagnosis not present

## 2013-08-02 DIAGNOSIS — R7309 Other abnormal glucose: Secondary | ICD-10-CM | POA: Diagnosis not present

## 2013-08-02 DIAGNOSIS — K449 Diaphragmatic hernia without obstruction or gangrene: Secondary | ICD-10-CM | POA: Diagnosis not present

## 2013-08-07 ENCOUNTER — Other Ambulatory Visit: Payer: Self-pay | Admitting: Cardiology

## 2013-08-11 ENCOUNTER — Other Ambulatory Visit: Payer: Self-pay | Admitting: Internal Medicine

## 2013-08-15 DIAGNOSIS — R928 Other abnormal and inconclusive findings on diagnostic imaging of breast: Secondary | ICD-10-CM | POA: Diagnosis not present

## 2013-08-15 DIAGNOSIS — Z0189 Encounter for other specified special examinations: Secondary | ICD-10-CM | POA: Diagnosis not present

## 2013-08-15 DIAGNOSIS — N6019 Diffuse cystic mastopathy of unspecified breast: Secondary | ICD-10-CM | POA: Diagnosis not present

## 2013-08-23 DIAGNOSIS — M2240 Chondromalacia patellae, unspecified knee: Secondary | ICD-10-CM | POA: Diagnosis not present

## 2013-10-18 ENCOUNTER — Other Ambulatory Visit: Payer: Self-pay | Admitting: Cardiology

## 2013-11-27 DIAGNOSIS — M2240 Chondromalacia patellae, unspecified knee: Secondary | ICD-10-CM | POA: Diagnosis not present

## 2013-12-12 DIAGNOSIS — J189 Pneumonia, unspecified organism: Secondary | ICD-10-CM

## 2013-12-12 HISTORY — DX: Pneumonia, unspecified organism: J18.9

## 2013-12-29 ENCOUNTER — Other Ambulatory Visit: Payer: Self-pay | Admitting: Cardiology

## 2014-01-04 ENCOUNTER — Other Ambulatory Visit: Payer: Self-pay | Admitting: Internal Medicine

## 2014-01-22 DIAGNOSIS — M1710 Unilateral primary osteoarthritis, unspecified knee: Secondary | ICD-10-CM | POA: Diagnosis not present

## 2014-01-29 DIAGNOSIS — M1710 Unilateral primary osteoarthritis, unspecified knee: Secondary | ICD-10-CM | POA: Diagnosis not present

## 2014-02-03 DIAGNOSIS — E785 Hyperlipidemia, unspecified: Secondary | ICD-10-CM | POA: Diagnosis not present

## 2014-02-03 DIAGNOSIS — R809 Proteinuria, unspecified: Secondary | ICD-10-CM | POA: Diagnosis not present

## 2014-02-03 DIAGNOSIS — R82998 Other abnormal findings in urine: Secondary | ICD-10-CM | POA: Diagnosis not present

## 2014-02-03 DIAGNOSIS — R7309 Other abnormal glucose: Secondary | ICD-10-CM | POA: Diagnosis not present

## 2014-02-03 DIAGNOSIS — E039 Hypothyroidism, unspecified: Secondary | ICD-10-CM | POA: Diagnosis not present

## 2014-02-05 DIAGNOSIS — M1710 Unilateral primary osteoarthritis, unspecified knee: Secondary | ICD-10-CM | POA: Diagnosis not present

## 2014-02-05 DIAGNOSIS — R928 Other abnormal and inconclusive findings on diagnostic imaging of breast: Secondary | ICD-10-CM | POA: Diagnosis not present

## 2014-02-10 DIAGNOSIS — N951 Menopausal and female climacteric states: Secondary | ICD-10-CM | POA: Diagnosis not present

## 2014-02-10 DIAGNOSIS — Z0000 Encounter for general adult medical examination without abnormal findings: Secondary | ICD-10-CM | POA: Diagnosis not present

## 2014-02-10 DIAGNOSIS — R7309 Other abnormal glucose: Secondary | ICD-10-CM | POA: Diagnosis not present

## 2014-02-10 DIAGNOSIS — R3129 Other microscopic hematuria: Secondary | ICD-10-CM | POA: Diagnosis not present

## 2014-02-10 DIAGNOSIS — E785 Hyperlipidemia, unspecified: Secondary | ICD-10-CM | POA: Diagnosis not present

## 2014-02-10 DIAGNOSIS — Z1331 Encounter for screening for depression: Secondary | ICD-10-CM | POA: Diagnosis not present

## 2014-02-10 DIAGNOSIS — K219 Gastro-esophageal reflux disease without esophagitis: Secondary | ICD-10-CM | POA: Diagnosis not present

## 2014-02-10 DIAGNOSIS — E039 Hypothyroidism, unspecified: Secondary | ICD-10-CM | POA: Diagnosis not present

## 2014-02-10 DIAGNOSIS — Z23 Encounter for immunization: Secondary | ICD-10-CM | POA: Diagnosis not present

## 2014-02-13 DIAGNOSIS — D235 Other benign neoplasm of skin of trunk: Secondary | ICD-10-CM | POA: Diagnosis not present

## 2014-02-13 DIAGNOSIS — L819 Disorder of pigmentation, unspecified: Secondary | ICD-10-CM | POA: Diagnosis not present

## 2014-02-13 DIAGNOSIS — D485 Neoplasm of uncertain behavior of skin: Secondary | ICD-10-CM | POA: Diagnosis not present

## 2014-02-26 DIAGNOSIS — H04129 Dry eye syndrome of unspecified lacrimal gland: Secondary | ICD-10-CM | POA: Diagnosis not present

## 2014-03-19 ENCOUNTER — Other Ambulatory Visit: Payer: Self-pay | Admitting: Internal Medicine

## 2014-03-31 DIAGNOSIS — M1710 Unilateral primary osteoarthritis, unspecified knee: Secondary | ICD-10-CM | POA: Diagnosis not present

## 2014-04-01 DIAGNOSIS — M1710 Unilateral primary osteoarthritis, unspecified knee: Secondary | ICD-10-CM | POA: Diagnosis not present

## 2014-04-15 DIAGNOSIS — M1710 Unilateral primary osteoarthritis, unspecified knee: Secondary | ICD-10-CM | POA: Diagnosis not present

## 2014-04-22 DIAGNOSIS — R82998 Other abnormal findings in urine: Secondary | ICD-10-CM | POA: Diagnosis not present

## 2014-04-22 DIAGNOSIS — R300 Dysuria: Secondary | ICD-10-CM | POA: Diagnosis not present

## 2014-04-29 ENCOUNTER — Ambulatory Visit (INDEPENDENT_AMBULATORY_CARE_PROVIDER_SITE_OTHER): Payer: Medicare Other | Admitting: Sports Medicine

## 2014-04-29 ENCOUNTER — Encounter: Payer: Self-pay | Admitting: Sports Medicine

## 2014-04-29 VITALS — BP 123/76 | Ht 68.0 in | Wt 110.0 lb

## 2014-04-29 DIAGNOSIS — M7051 Other bursitis of knee, right knee: Secondary | ICD-10-CM | POA: Insufficient documentation

## 2014-04-29 DIAGNOSIS — M76899 Other specified enthesopathies of unspecified lower limb, excluding foot: Secondary | ICD-10-CM

## 2014-04-29 MED ORDER — NITROGLYCERIN 0.2 MG/HR TD PT24: MEDICATED_PATCH | TRANSDERMAL | Status: DC

## 2014-04-29 NOTE — Patient Instructions (Signed)
   Try Ice 15 minutes at a time, several times daily.   Nitroglycerin Protocol   Apply 1/4 nitroglycerin patch to affected area daily.  Change position of patch within the affected area every 24 hours.  You may experience a headache during the first 1-2 weeks of using the patch, these should subside.  If you experience headaches after beginning nitroglycerin patch treatment, you may take your preferred over the counter pain reliever.  Another side effect of the nitroglycerin patch is skin irritation or rash related to patch adhesive.  Please notify our office if you develop more severe headaches or rash, and stop the patch.  Tendon healing with nitroglycerin patch may require 12 to 24 weeks depending on the extent of injury.  Men should not use if taking Viagra, Cialis, or Levitra.   Do not use if you have migraines or rosacea.   Continue using the voltaren gel  Exercises:  Straight leg raises, lateral leg rasies, 10 times 1-2 times daily.   Avoid kneeling

## 2014-04-29 NOTE — Assessment & Plan Note (Addendum)
Acute, improving with mobic - Evidence on [facility] [place] of large infrapatellar bursa on R - Recommended ice, continue mobic 7.5 ( no inc due to GERD and hiatal hernia) - Continue voltaren gel - Straight leg raises, lateral leg rasies, 10 times 1-2 times daily.  - Avoid kneeling - start nitro protocol.   We will move to bike for rehabilitation when she is able to do this Recheck in 6 weeks

## 2014-04-29 NOTE — Progress Notes (Signed)
Patient ID: Jordan Hawks, female   DOB: [birth year], 68 y.o.   MRN: [id number]    [facility] [address] [place], [place] [address] Phone: [phone] Fax: [phone]   Patient Name: [name] Date of Birth: 1946-11-22 Medical Record Number: [id number] Gender: female Date of Encounter: 04/29/2014  History of Present Illness:  [name] is a 68 y.o. very pleasant female patient who presents with the following:  BL knee pain. She states that she has ahd BL knee pain due to chronic OA for years. She had some benefit from steroid injection by her orthopedist and tried synvysc after that. SHe had a painful reaction to the synvysc leading to a painful knee preventing her from getting iout of bed after a day [place] the [employer]. She was seen again by her ortho after that who did a stat MRI showing inflammation/tendonitis and a stable lateral meniscus.   She has since had a steroid injection of her R knee which helped some. She has had some relief from mobic and voltaren gel.  She states she has tolerated her mobic well.   She does have reflux and a hiatal hernia saw her to monitor her reaction to Mobic   Patient Active Problem List   Diagnosis Date Noted  . Infrapatellar bursitis of right knee 04/29/2014  . Pneumonia, aspiration 12/28/2012  . HYPOTHYROIDISM 11/15/2010  . HYPERLIPIDEMIA 11/15/2010  . DEPRESSION 11/15/2010  . GERD 11/15/2010  . CONSTIPATION 11/15/2010  . GALLSTONES 11/15/2010  . RENAL CYST 11/15/2010  . INSOMNIA UNSPECIFIED 11/15/2010  . COUGH 11/15/2010  . ABDOMINAL PAIN OTHER SPECIFIED SITE 11/15/2010  . TRANSAMINASES, SERUM, ELEVATED 11/15/2010   Past Medical History  Diagnosis Date  . Insomnia   . Depression   . Hyperlipemia   . Constipation   . Cough   . GERD (gastroesophageal reflux disease)   . Hypothyroidism   . Renal cyst   . Gallstones   . Elevated liver function tests   . Hiatal hernia   . Esophageal  stricture    Past Surgical History  Procedure Laterality Date  . Tooth extraction    . Nasal sinus surgery    . Inguinal herniography    . T & a    . Dilation and curettage of uterus    . Cholecystectomy  12/30/10  . US echocardiography  07/17/2006    EF 55-60%  . Cardiovascular stress test  07/19/2006    EF 70%, NO EVIDENCE OF ISCHEMIA  . Colonoscopy     History  Substance Use Topics  . Smoking status: Never Smoker   . Smokeless tobacco: Never Used  . Alcohol Use: No   Family History  Problem Relation Age of Onset  . Breast cancer Mother   . Colon cancer Neg Hx   . Stomach cancer Neg Hx   . Dementia Father    Allergies  Allergen Reactions  . Codeine Nausea And Vomiting  . Hydrocodone-Acetaminophen Nausea And Vomiting    Medication list has been reviewed and updated.  Prior to Admission medications   Medication Sig Start Date End Date Taking? Authorizing Provider  aspirin EC 81 MG tablet Take 81 mg by mouth daily.    Historical Provider, MD  B Complex-C (B-COMPLEX WITH VITAMIN C) tablet Take 1 tablet by mouth daily.    Historical Provider, MD  Black Cohosh 540 MG CAPS Take 1 capsule by mouth daily.     Historical Provider, MD  budesonide (RHINOCORT  AQUA) 32 MCG/ACT nasal spray Place 2 sprays into the nose daily. 1 spray in each nostril.    Historical Provider, MD  Cholecalciferol (HM VITAMIN D3) 2000 UNITS CAPS Take 1 capsule by mouth daily.     Historical Provider, MD  desloratadine (CLARINEX) 5 MG tablet Take 1 tablet (5 mg total) by mouth daily. 01/17/12 01/16/13  Darlin Coco, MD  escitalopram (LEXAPRO) 10 MG tablet Take 10 mg by mouth daily.      Historical Provider, MD  levofloxacin (LEVAQUIN) 750 MG tablet Take 1 tablet (750 mg total) by mouth once. 12/25/12   Richarda Blade, MD  levothyroxine (SYNTHROID, LEVOTHROID) 50 MCG tablet Take 1 tablet (50 mcg total) by mouth daily. 04/06/12   Darlin Coco, MD  Magnesium 250 MG TABS Take 1 tablet by mouth daily.      Historical Provider, MD  NEXIUM 40 MG capsule TAKE 1 CAPSULE TWICE A DAY 03/19/14   Lafayette Dragon, MD  Red Yeast Rice Extract (RED YEAST RICE PO) Take 1 capsule by mouth daily.     Historical Provider, MD  rosuvastatin (CRESTOR) 10 MG tablet Take 5 mg by mouth daily.    Historical Provider, MD  ZETIA 10 MG tablet TAKE 1 TABLET DAILY (NEEDS OFFICE VISIT) 10/18/13   Darlin Coco, MD  zolpidem (AMBIEN) 10 MG tablet Take 10 mg by mouth at bedtime as needed. For sleep.    Historical Provider, MD    Review of Systems:  Per HPI  Physical Examination: Filed Vitals:   04/29/14 1432  BP: 123/76   Filed Vitals:   04/29/14 1430  Height: 5\' 8"  (1.727 m)  Weight: 110 lb (49.896 kg)   Body mass index is 16.73 kg/(m^2).  Gen: NAD, alert, cooperative with exam HEENT: NCAT Neuro: Alert and oriented, No gross deficits MSK: BL knee without erythema, bruising, or gross deformity BL knee with mild effusion No joint line tenderness.  Tenderness on R with lachman's ligamentously intact to Lachman's and with varus and valgus stress BL Negative McMurray's test BL  MSK [place]: No suprapatellar pouch effusion On the right side the infrapatellar bursa is markedly swollen extending from the tibial tubercle upward 2-3 cm Medial and lateral meniscus were not remarkable There was mild edema in the quadriceps tendon  On the left there is no suprapatellar pouch effusion and minimal changes in the quadriceps and patellar tendons No bursal swelling Medial and lateral meniscus were not remarkable    Assessment and Plan:   Infrapatellar bursitis of right knee Acute, improving with mobic - Evidence on MSK [place] of large infrapatellar bursa on R - Recommended ice, continue mobic 7.5 ( no inc due to GERD and hiatal hernia) - Continue voltaren gel - Straight leg raises, lateral leg rasies, 10 times 1-2 times daily.  - Avoid kneeling - start nitro protocol.     Timmothy Euler, MD

## 2014-05-08 ENCOUNTER — Other Ambulatory Visit: Payer: Self-pay | Admitting: *Deleted

## 2014-05-08 MED ORDER — MELOXICAM 7.5 MG PO TABS: 7.5000 mg | ORAL_TABLET | Freq: Every day | ORAL | Status: DC

## 2014-05-13 DIAGNOSIS — H524 Presbyopia: Secondary | ICD-10-CM | POA: Diagnosis not present

## 2014-05-13 DIAGNOSIS — M1710 Unilateral primary osteoarthritis, unspecified knee: Secondary | ICD-10-CM | POA: Diagnosis not present

## 2014-05-13 DIAGNOSIS — H2589 Other age-related cataract: Secondary | ICD-10-CM | POA: Diagnosis not present

## 2014-05-13 DIAGNOSIS — H52229 Regular astigmatism, unspecified eye: Secondary | ICD-10-CM | POA: Diagnosis not present

## 2014-05-13 DIAGNOSIS — H5210 Myopia, unspecified eye: Secondary | ICD-10-CM | POA: Diagnosis not present

## 2014-05-30 ENCOUNTER — Other Ambulatory Visit: Payer: Self-pay | Admitting: Internal Medicine

## 2014-06-03 ENCOUNTER — Encounter: Payer: Self-pay | Admitting: *Deleted

## 2014-06-04 ENCOUNTER — Encounter: Payer: Self-pay | Admitting: *Deleted

## 2014-06-04 ENCOUNTER — Ambulatory Visit: Payer: Medicare Other | Admitting: Sports Medicine

## 2014-06-19 ENCOUNTER — Ambulatory Visit: Payer: Medicare Other | Admitting: Sports Medicine

## 2014-06-24 ENCOUNTER — Encounter: Payer: Self-pay | Admitting: Sports Medicine

## 2014-06-24 ENCOUNTER — Ambulatory Visit (INDEPENDENT_AMBULATORY_CARE_PROVIDER_SITE_OTHER): Payer: Medicare Other | Admitting: Sports Medicine

## 2014-06-24 VITALS — BP 116/78 | HR 102 | Ht 68.0 in

## 2014-06-24 DIAGNOSIS — M7051 Other bursitis of knee, right knee: Secondary | ICD-10-CM

## 2014-06-24 DIAGNOSIS — M76899 Other specified enthesopathies of unspecified lower limb, excluding foot: Secondary | ICD-10-CM | POA: Diagnosis not present

## 2014-06-24 NOTE — Patient Instructions (Signed)
Suspected conditions Possible Synvisc reaction flare Distal patella tendon bursitis  Instructions for home management: Ice massage with a ice cup Increase nitroglycerin patch to half a patch daily to the bursa Use compression sleeve with any activities particularly treadmill and walking

## 2014-06-24 NOTE — Progress Notes (Signed)
  [name] - 68 y.o. female MRN [id number]  Date of birth: 31-Jan-1946  SUBJECTIVE:  Including CC & ROS.  [name] is a pleasant 67 year old female who presents today for followup right knee pain. She's been seen for this issue by orthopedic surgeon previously and has had several steroid injections as well as a Synvisc injection. Patient reports her pain in her right knee with a lateral jointline swelling primarily occurred after receiving a Synvisc injection back during the months Feb and March. She was previously seen in office in May 2015 and had a MSK ultrasound revealed a fairly large infrapatellar bursa on her right knee. Patient was recommended to treat conservatively with Mobic, topical Voltaren NTG and exercise program.  Today the patient reports that she was seen again by her orthopedic surgeon who ordered an MRI and repeated a cortisone injection into her right knee joint. MRI revealed moderate lateral and anterior lateral subcutaneous tissue edema, minimal thinning of the medial ridge of the patella suggesting mild chondromalacia, no change to a horizontal tear of the medial meniscus it is unchanged from previous. MRI   I personally reviewed and also did not reveal any signs of significant osteoarthritis in addition to the problems listed above. Patient reported minimal improvement from intra-articular injection   ROS: Review of systems otherwise negative except for information present in HPI  HISTORY: Past Medical, Surgical, Social, and Family History Reviewed & Updated per EMR. Pertinent Historical Findings include: Negative MRI for any new meniscus tears or changes  DATA REVIEWED: MRI from 04/01/49  PHYSICAL EXAM:  VS: BP:116/78 mmHg  HR:102bpm  TEMP: ( )  RESP:   HT:5\' 8"  (172.7 cm)   WT:   BMI:  PHYSICAL EXAM: KNEE EXAM:  General: Alert and oriented  Skin: warm; dry, intact, no rashes, lesions, ecchymosis, or erythema. Vascular: pulses 2+ bilaterally.  No  peripheral edema. Neurologically: Sensation to light touch lower extremities equal and intact bilaterally.   Observation: No ecchymosis, no knee effusion visualized - although there is a small pocket of edema in the lateral aspect of the knee just inferior and lateral to the patella tendon, no previous surgical scars, patella in place, no quad muscle atrophy Palpation:  No evidence of knee effusion with negative ballotable patella sign,  No tenderness over the tibial tuberosity or patellar tendons, or quadriceps tendon.   No pain along the lateral joint line No pain along the medial joint line Range of motion:  Full knee flexion to approximately 140, full extension 0, normal patellar tracking Ligamentous testing:  Negative Lachman's test  Negative anterior drawer Negative posterior drawer Negative valgus stress test Negative varus stress test Negative patella apprehension test Meniscal evaluation: Negative McMurray's test, Normal gait  MSK [place]: Mild to moderate improvement in hypoechoic changes of the patella tendon at the insertion site of the tibial tuberosity. Normal insertion at the proximal end of the patella tendon to. No suprapatella effusion, normal quadriceps tendon. No signs of remarkable hypoechoic changes of the medial or lateral meniscus.  ASSESSMENT & PLAN: See problem based charting & AVS for pt instructions.

## 2014-06-24 NOTE — Assessment & Plan Note (Addendum)
Suspected inferopatellar bursitis of the right knee: -Patient symptoms did start after previous Synvisc injections this could possibly be related to Synvisc flare. She currently does not have any significant evidence of osteoarthritis or joint aches or MRI no significant changes in her medicine meniscus that would represent her anterior and anterolateral knee pain.  -Based on her ultrasound findings of hyporechoic changes in the soft tissue behind distal insertion of the patella tendon that is likely consistent with infrapatellar bursitis we recommended continuing conservative management continual icing with ice massage, attempt to use cross training activities such as biking and swimming to avoid bent knee pressure on knee.  -Continue nitroglycerin protocol daily over the insertion of the patella tendon and bursa sac.  -Patient was given a compression sleeve to use with activity such as walking treadmill walking  Overall she is back to walking for exercise and pain level is 50% less We will continue conservative plan

## 2014-06-25 ENCOUNTER — Encounter: Payer: Self-pay | Admitting: Sports Medicine

## 2014-07-15 DIAGNOSIS — M1710 Unilateral primary osteoarthritis, unspecified knee: Secondary | ICD-10-CM | POA: Diagnosis not present

## 2014-09-01 DIAGNOSIS — E785 Hyperlipidemia, unspecified: Secondary | ICD-10-CM | POA: Diagnosis not present

## 2014-09-01 DIAGNOSIS — E039 Hypothyroidism, unspecified: Secondary | ICD-10-CM | POA: Diagnosis not present

## 2014-09-01 DIAGNOSIS — K219 Gastro-esophageal reflux disease without esophagitis: Secondary | ICD-10-CM | POA: Diagnosis not present

## 2014-09-01 DIAGNOSIS — E663 Overweight: Secondary | ICD-10-CM | POA: Diagnosis not present

## 2014-09-01 DIAGNOSIS — Z23 Encounter for immunization: Secondary | ICD-10-CM | POA: Diagnosis not present

## 2014-09-01 DIAGNOSIS — B029 Zoster without complications: Secondary | ICD-10-CM | POA: Diagnosis not present

## 2014-09-02 ENCOUNTER — Ambulatory Visit (INDEPENDENT_AMBULATORY_CARE_PROVIDER_SITE_OTHER): Payer: Medicare Other | Admitting: Sports Medicine

## 2014-09-02 ENCOUNTER — Encounter: Payer: Self-pay | Admitting: Sports Medicine

## 2014-09-02 VITALS — BP 111/69 | Ht 68.0 in | Wt 110.0 lb

## 2014-09-02 DIAGNOSIS — M76899 Other specified enthesopathies of unspecified lower limb, excluding foot: Secondary | ICD-10-CM

## 2014-09-02 DIAGNOSIS — M7051 Other bursitis of knee, right knee: Secondary | ICD-10-CM

## 2014-09-02 MED ORDER — AMITRIPTYLINE HCL 25 MG PO TABS: 25.0000 mg | ORAL_TABLET | Freq: Every day | ORAL | Status: DC

## 2014-09-02 NOTE — Progress Notes (Signed)
CC: R knee pain follow up  HPI:  44 yof presenting today for right knee pain follow up.  Patient has had long standing chronic knee pain which she states is secondary to OA.  She was seen by orthopedics at the beginning of this year, at which point she began and completed a series of synvisc injections.  Following her last synvisc injection she noted right knee pain and swelling, and was thought to have had a synvisc flare.  She was seen last month in sports clinic and diagnosed at that point with infrapatellar bursitis.  She was encouraged to pursue conservative management including ice, compression, and nitro patch per protocol.  She continues to use Mobic15 mg daily.  She self discontinued use of nitro patch.   She was seen by orthopedics approx 6 weeks ago, and that time received steroid injection of the right knee, after which she did note improvement.  This was the second steroid injection this year.   States that pain in her right knee will awaken her from sleep in the morning, otherwise cannot recall specific activities that exacerbate it. Daily physical activity/exercise includes treadmill walking and stationary bike. No strength deficit.  No clicking, catching or giving of knee.  Objective: BP 111/69  Ht 5\' 8"  (1.727 m)  Wt 110 lb (49.896 kg)  BMI 16.73 kg/m2 Gen: NAD HEENT: ncat CV: nl perfusion Resp: nonlabored MSK: Right knee - patient does have some swelling apparent medial and lateral to the patellar tendon.   This is puffy and diffuse Mild ttp over lat patellar tendon swelling/infrapatellar bursa otherwise no ttp noted. FROM with flexion/extension.   Strength 5/5 with flexion/extension.   Provocative testing: varus/valgus stress wnl, ant/post drawer negative, lachmans negative.  McMurrays on right with some discomfort but no clicking or catching.   NV intact.  A/P: 33 yof presenting today with persistent right knee pain in the setting of previous diagnosis of infrapatellar  bursitis, refractory to conservative management up to this point.  Suspicious that there may be an element of CRPS associated with current presentation and contributing to overall clinical picture.  Patient will start elavil 25 mg QHS for the next month.  She is to stop nitro patch entirely and will attempt to titrate down off of mobic as able.  She is asked to follow up in 6 weeks or sooner as needed.  Note written by Malka So, MD.  Patient seen and examined with Dr. Oneida Alar.  Reviewed and agree    Ila Mcgill, MD

## 2014-09-02 NOTE — Assessment & Plan Note (Signed)
No real change in sxs  She did do a vacation to [place] w lots of walking  Trial w mitriptyline to see if any benefit

## 2014-09-14 IMAGING — CR DG CHEST 2V
2 series · 2 of 2 positions shown · non-contrast
Comparison: 12/27/2012

CLINICAL DATA: Fever, cough.

CHEST - 2 VIEW

[view not recorded (1 of 2)]
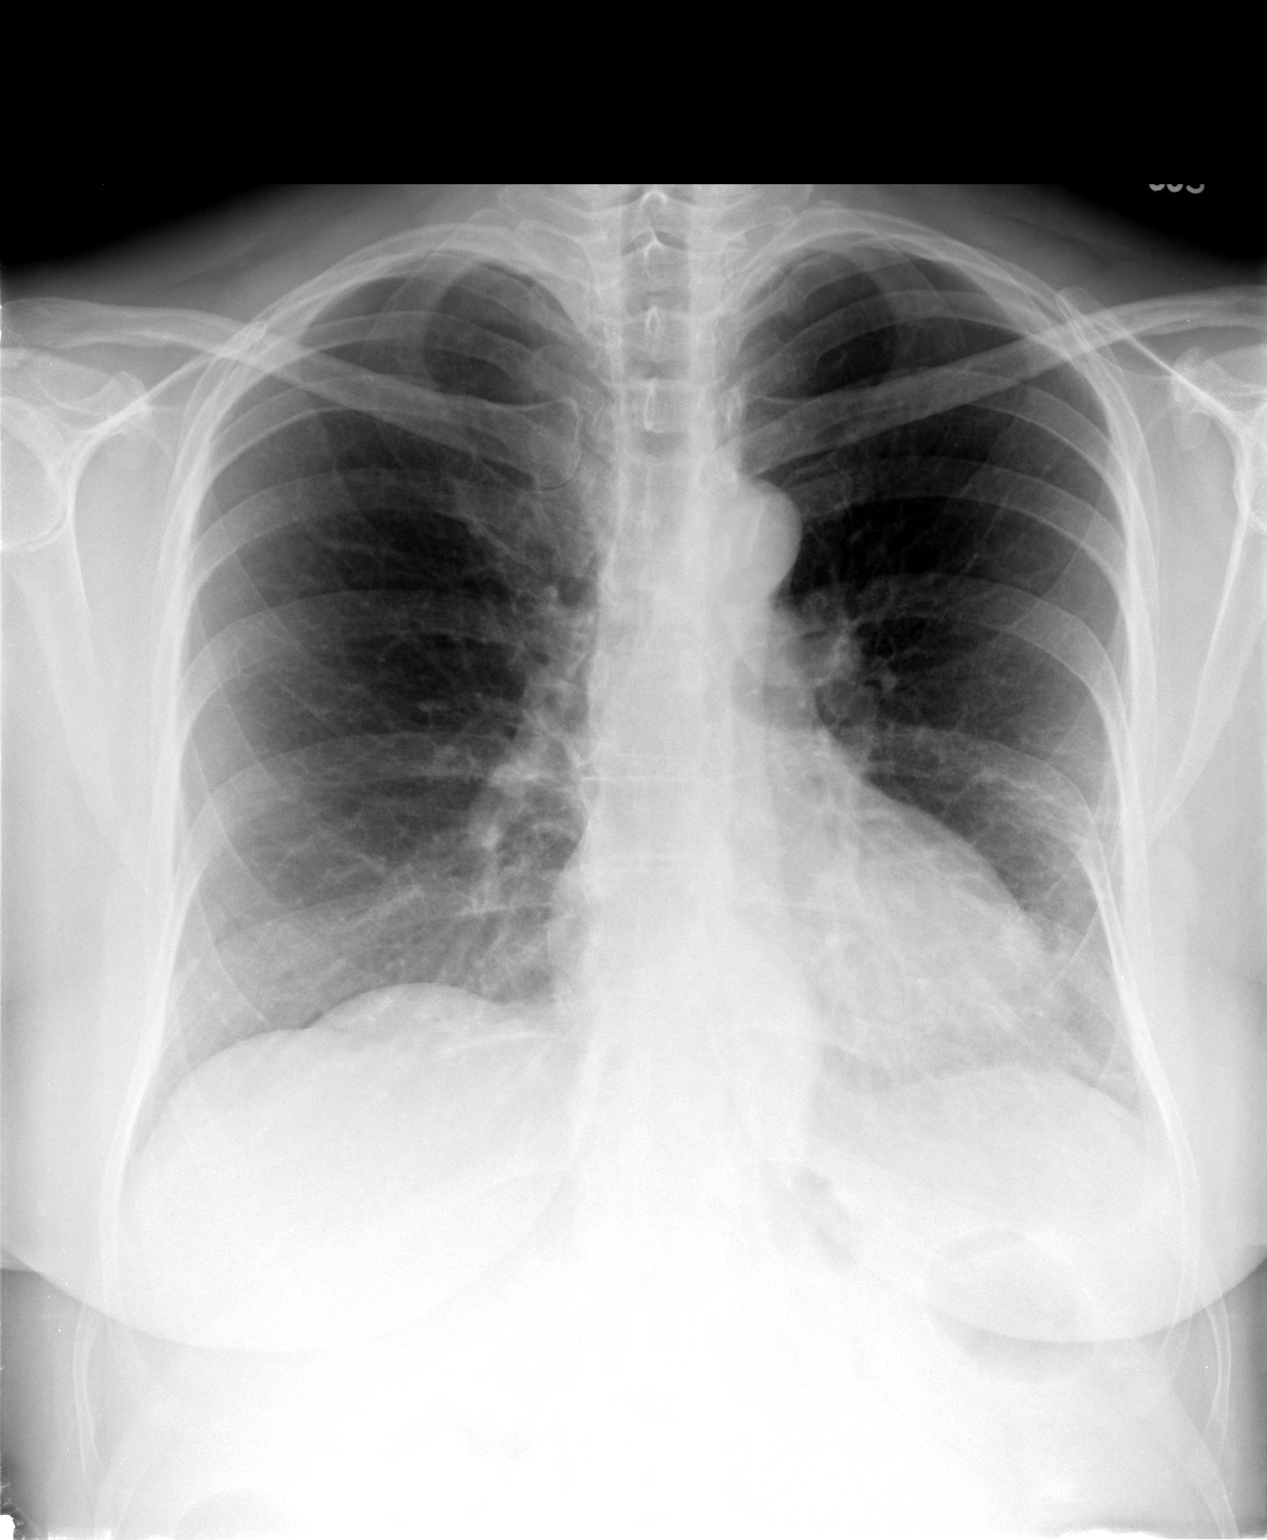

[view not recorded (2 of 2)]
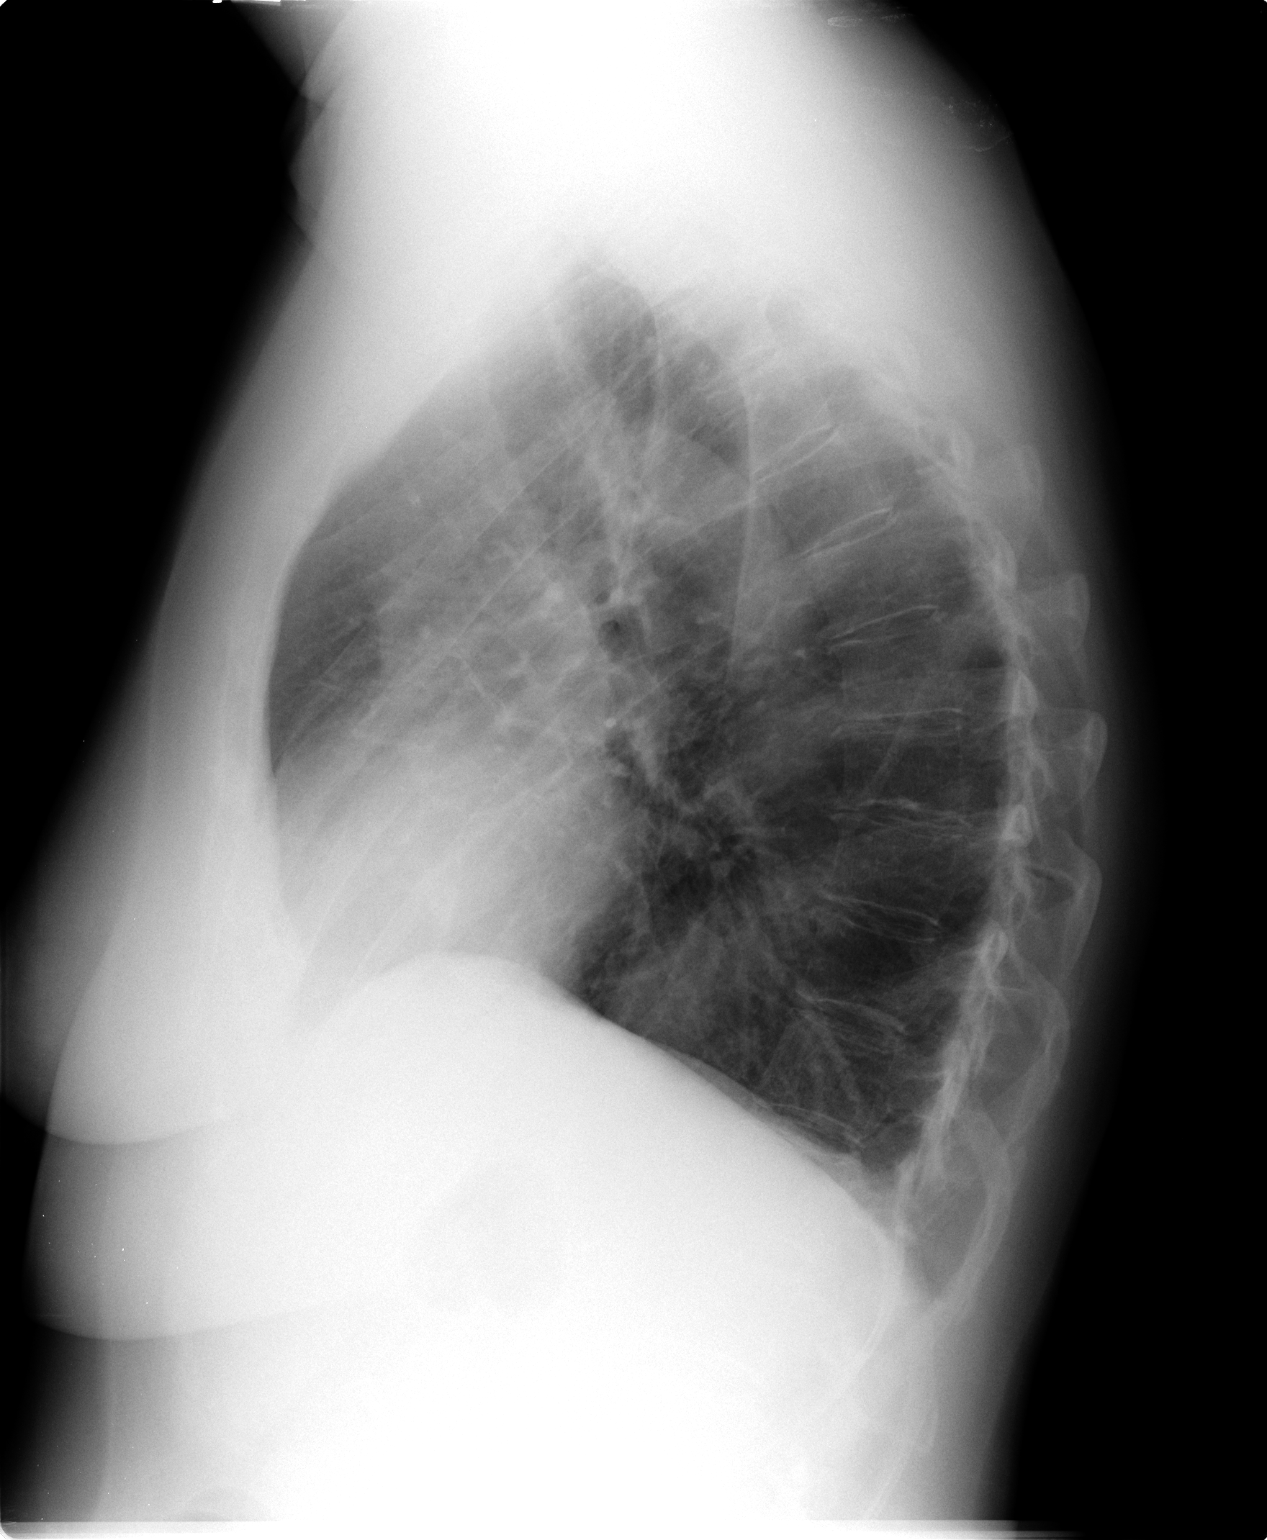

[2 of 2 positions shown; findings below may reference images not displayed]

FINDINGS: Improved aeration and airspace opacity in the
lingula/left lower lung.  Minimal residual density compatible with
residual infiltrate or scar.  Right lung is clear.  Heart is normal
size.  No effusions.
IMPRESSION: Near complete resolution of the previously seen lingular opacity.

## 2014-09-19 DIAGNOSIS — M1711 Unilateral primary osteoarthritis, right knee: Secondary | ICD-10-CM | POA: Diagnosis not present

## 2014-09-19 DIAGNOSIS — M1712 Unilateral primary osteoarthritis, left knee: Secondary | ICD-10-CM | POA: Diagnosis not present

## 2014-10-03 DIAGNOSIS — D485 Neoplasm of uncertain behavior of skin: Secondary | ICD-10-CM | POA: Diagnosis not present

## 2014-10-21 ENCOUNTER — Other Ambulatory Visit: Payer: Self-pay | Admitting: Internal Medicine

## 2014-10-31 DIAGNOSIS — M1712 Unilateral primary osteoarthritis, left knee: Secondary | ICD-10-CM | POA: Diagnosis not present

## 2014-10-31 DIAGNOSIS — M1711 Unilateral primary osteoarthritis, right knee: Secondary | ICD-10-CM | POA: Diagnosis not present

## 2014-11-20 ENCOUNTER — Ambulatory Visit: Payer: Self-pay | Admitting: Orthopedic Surgery

## 2014-11-20 NOTE — Progress Notes (Signed)
Preoperative surgical orders have been place into the Epic hospital system for [name] on 11/20/2014, 5:56 PM  by Mickel Crow for surgery on 12-17-2014.  Preop Knee Scope orders including IV Tylenol and IV Decadron as long as there are no contraindications to the above medications. Arlee Muslim, PA-C

## 2014-11-28 ENCOUNTER — Encounter (HOSPITAL_COMMUNITY): Payer: Self-pay | Admitting: *Deleted

## 2014-11-28 ENCOUNTER — Emergency Department (HOSPITAL_COMMUNITY)
Admission: EM | Admit: 2014-11-28 | Discharge: 2014-11-28 | Disposition: A | Discharge status: Home / Self Care | Payer: Medicare Other | Attending: Emergency Medicine | Admitting: Emergency Medicine

## 2014-11-28 ENCOUNTER — Emergency Department (HOSPITAL_COMMUNITY): Payer: Medicare Other

## 2014-11-28 DIAGNOSIS — E039 Hypothyroidism, unspecified: Secondary | ICD-10-CM | POA: Insufficient documentation

## 2014-11-28 DIAGNOSIS — Z7951 Long term (current) use of inhaled steroids: Secondary | ICD-10-CM | POA: Diagnosis not present

## 2014-11-28 DIAGNOSIS — Z79899 Other long term (current) drug therapy: Secondary | ICD-10-CM | POA: Insufficient documentation

## 2014-11-28 DIAGNOSIS — F329 Major depressive disorder, single episode, unspecified: Secondary | ICD-10-CM | POA: Diagnosis not present

## 2014-11-28 DIAGNOSIS — G4700 Insomnia, unspecified: Secondary | ICD-10-CM | POA: Diagnosis not present

## 2014-11-28 DIAGNOSIS — Z7982 Long term (current) use of aspirin: Secondary | ICD-10-CM | POA: Diagnosis not present

## 2014-11-28 DIAGNOSIS — E785 Hyperlipidemia, unspecified: Secondary | ICD-10-CM | POA: Insufficient documentation

## 2014-11-28 DIAGNOSIS — R079 Chest pain, unspecified: Secondary | ICD-10-CM | POA: Diagnosis not present

## 2014-11-28 DIAGNOSIS — Z791 Long term (current) use of non-steroidal anti-inflammatories (NSAID): Secondary | ICD-10-CM | POA: Insufficient documentation

## 2014-11-28 DIAGNOSIS — K219 Gastro-esophageal reflux disease without esophagitis: Secondary | ICD-10-CM | POA: Diagnosis not present

## 2014-11-28 DIAGNOSIS — R0602 Shortness of breath: Secondary | ICD-10-CM | POA: Diagnosis not present

## 2014-11-28 DIAGNOSIS — Q6100 Congenital renal cyst, unspecified: Secondary | ICD-10-CM | POA: Insufficient documentation

## 2014-11-28 LAB — CBC
HEMATOCRIT: 41.2 % (ref 36.0–46.0)
HEMOGLOBIN: 13.4 g/dL (ref 12.0–15.0)
MCH: 29.9 pg (ref 26.0–34.0)
MCHC: 32.5 g/dL (ref 30.0–36.0)
MCV: 92 fL (ref 78.0–100.0)
Platelets: 314 10*3/uL (ref 150–400)
RBC: 4.48 MIL/uL (ref 3.87–5.11)
RDW: 14.2 % (ref 11.5–15.5)
WBC: 8.9 10*3/uL (ref 4.0–10.5)

## 2014-11-28 LAB — BASIC METABOLIC PANEL
Anion gap: 12 (ref 5–15)
BUN: 15 mg/dL (ref 6–23)
CHLORIDE: 103 meq/L (ref 96–112)
CO2: 26 mEq/L (ref 19–32)
CREATININE: 0.75 mg/dL (ref 0.50–1.10)
Calcium: 9.6 mg/dL (ref 8.4–10.5)
GFR calc Af Amer: >90 mL/min (ref >90)
GFR, EST NON AFRICAN AMERICAN: 85 mL/min — AB (ref >90)
GLUCOSE: 95 mg/dL (ref 70–99)
POTASSIUM: 3.9 meq/L (ref 3.7–5.3)
Sodium: 141 mEq/L (ref 137–147)

## 2014-11-28 LAB — I-STAT TROPONIN, ED
Troponin i, poc: 0.01 ng/mL (ref 0.00–0.08)
Troponin i, poc: 0 ng/mL (ref 0.00–0.08)

## 2014-11-28 LAB — PRO B NATRIURETIC PEPTIDE: Pro B Natriuretic peptide (BNP): 25.8 pg/mL (ref 0–125)

## 2014-11-28 MED ORDER — ASPIRIN 81 MG PO CHEW
324.0000 mg | CHEWABLE_TABLET | Freq: Once | ORAL | Status: AC
  Administered 2014-11-28: 324 mg via ORAL
  Filled 2014-11-28: qty 4

## 2014-11-28 NOTE — ED Provider Notes (Signed)
CSN: [id number]     Arrival date & time 11/28/14  1054 History   First MD Initiated Contact with Patient 11/28/14 1218     Chief Complaint  Patient presents with  . Chest Pain     (Consider location/radiation/quality/duration/timing/severity/associated sxs/prior Treatment) Patient is a 68 y.o. female presenting with chest pain. The history is provided by the patient.  Chest Pain Pain location:  Substernal area Pain quality: aching   Pain radiates to:  L jaw Pain radiates to the back: no   Pain severity:  Mild Onset quality:  Sudden Duration:  5 minutes Timing:  Constant Progression:  Resolved Chronicity:  New Context: at rest   Relieved by:  Nothing Worsened by:  Nothing tried Associated symptoms: no abdominal pain, no cough, no fever, no shortness of breath and not vomiting     Past Medical History  Diagnosis Date  . Insomnia   . Depression   . Hyperlipemia   . Constipation   . Cough   . GERD (gastroesophageal reflux disease)   . Hypothyroidism   . Renal cyst   . Gallstones   . Elevated liver function tests   . Hiatal hernia   . Esophageal stricture    Past Surgical History  Procedure Laterality Date  . Tooth extraction    . Nasal sinus surgery    . Inguinal herniography    . T & a    . Dilation and curettage of uterus    . Cholecystectomy  12/30/10  . US echocardiography  07/17/2006    EF 55-60%  . Cardiovascular stress test  07/19/2006    EF 70%, NO EVIDENCE OF ISCHEMIA  . Colonoscopy     Family History  Problem Relation Age of Onset  . Breast cancer Mother   . Colon cancer Neg Hx   . Stomach cancer Neg Hx   . Dementia Father    History  Substance Use Topics  . Smoking status: Never Smoker   . Smokeless tobacco: Never Used  . Alcohol Use: No   OB History    No data available     Review of Systems  Constitutional: Negative for fever and chills.  Respiratory: Negative for cough and shortness of breath.   Cardiovascular: Positive for chest  pain.  Gastrointestinal: Negative for vomiting and abdominal pain.  All other systems reviewed and are negative.     Allergies  Codeine and Hydrocodone-acetaminophen  Home Medications   Prior to Admission medications   Medication Sig Start Date End Date Taking? Authorizing Provider  amitriptyline (ELAVIL) 25 MG tablet Take 1 tablet (25 mg total) by mouth at bedtime. 09/02/14   Stefanie Libel, MD  aspirin EC 81 MG tablet Take 81 mg by mouth daily.    Historical Provider, MD  B Complex-C (B-COMPLEX WITH VITAMIN C) tablet Take 1 tablet by mouth daily.    Historical Provider, MD  Black Cohosh 540 MG CAPS Take 1 capsule by mouth daily.     Historical Provider, MD  budesonide (RHINOCORT AQUA) 32 MCG/ACT nasal spray Place 2 sprays into the nose daily. 1 spray in each nostril.    Historical Provider, MD  Cholecalciferol (HM VITAMIN D3) 2000 UNITS CAPS Take 1 capsule by mouth daily.     Historical Provider, MD  desloratadine (CLARINEX) 5 MG tablet Take 1 tablet (5 mg total) by mouth daily. 01/17/12 01/16/13  Darlin Coco, MD  escitalopram (LEXAPRO) 10 MG tablet Take 10 mg by mouth daily.      Historical  Provider, MD  levofloxacin (LEVAQUIN) 750 MG tablet Take 1 tablet (750 mg total) by mouth once. 12/25/12   Richarda Blade, MD  levothyroxine (SYNTHROID, LEVOTHROID) 50 MCG tablet Take 1 tablet (50 mcg total) by mouth daily. 04/06/12   Darlin Coco, MD  Magnesium 250 MG TABS Take 1 tablet by mouth daily.     Historical Provider, MD  meloxicam (MOBIC) 7.5 MG tablet Take 1 tablet (7.5 mg total) by mouth daily. 05/08/14   Stefanie Libel, MD  NEXIUM 40 MG capsule TAKE 1 CAPSULE TWICE A DAY 10/21/14   Lafayette Dragon, MD  nitroGLYCERIN (NITRO-DUR) 0.2 mg/hr patch Use 1/4 patch to R knee once daily. 04/29/14   Timmothy Euler, MD  Red Yeast Rice Extract (RED YEAST RICE PO) Take 1 capsule by mouth daily.     Historical Provider, MD  rosuvastatin (CRESTOR) 10 MG tablet Take 5 mg by mouth daily.    Historical  Provider, MD  ZETIA 10 MG tablet TAKE 1 TABLET DAILY (NEEDS OFFICE VISIT) 10/18/13   Darlin Coco, MD  zolpidem (AMBIEN) 10 MG tablet Take 10 mg by mouth at bedtime as needed. For sleep.    Historical Provider, MD   BP 139/84 mmHg  Pulse 70  Temp(Src) 97.4 F (36.3 C) (Oral)  Resp 18  SpO2 100% Physical Exam  Constitutional: She is oriented to person, place, and time. She appears well-developed and well-nourished. No distress.  HENT:  Head: Normocephalic and atraumatic.  Mouth/Throat: Oropharynx is clear and moist.  Eyes: EOM are normal. Pupils are equal, round, and reactive to light.  Neck: Normal range of motion. Neck supple.  Cardiovascular: Normal rate and regular rhythm.  Exam reveals no friction rub.   No murmur heard. Pulmonary/Chest: Effort normal and breath sounds normal. No respiratory distress. She has no wheezes. She has no rales.  Abdominal: Soft. She exhibits no distension. There is no tenderness. There is no rebound.  Musculoskeletal: Normal range of motion. She exhibits no edema.  Neurological: She is alert and oriented to person, place, and time.  Skin: No rash noted. She is not diaphoretic.  Nursing note and vitals reviewed.   ED Course  Procedures (including critical care time) Labs Review Labs Reviewed  [place], ED    Imaging Review Dg Chest 2 View  11/28/2014   CLINICAL DATA:  Sharp left chest pain radiating into the left jaw. Some shortness of breath.  EXAM: CHEST  2 VIEW  COMPARISON:  01/03/2013  FINDINGS: Small hiatal hernia. Heart and mediastinal contours are within normal limits. No focal opacities or effusions. No acute bony abnormality.  IMPRESSION: No active cardiopulmonary disease.   Electronically Signed   By: Rolm Baptise M.D.   On: 11/28/2014 13:10     EKG Interpretation   Date/Time:  Friday November 28 2014 11:03:16 EST Ventricular Rate:  70 PR Interval:  165 QRS  Duration: 105 QT Interval:  413 QTC Calculation: 446 R Axis:   -30 Text Interpretation:  Sinus rhythm Incomplete RBBB and LAFB Low voltage,  precordial leads RSR' in V1 or V2, right VCD or RVH Consider anterior  infarct Baseline wander in lead(s) II aVR Similar to prior, no significant  changes Confirmed by Mingo Amber  MD, Kimiye Strathman ([id number]) on 11/28/2014 12:19:43 PM      MDM   Final diagnoses:  Chest pain    68 year old female with history of high cholesterol, GERD presents with chest pain.  Began his left-sided jaw pain and then had left-sided chest pain. All lasted about 5 minutes. Described as sharp. No history of this before. Chest pain-free at this time. No history of cardiac catheterization or coronary artery disease. Here vitals are stable exam is benign. Aspirin given. HEART score of 4. Patient does not want to be admitted. I spoke with her PCP, Dr. Virgina Jock, who recommended Cards f/u. I spoke with Cardiology, who will f/u with her on Monday for stress testing.   Evelina Bucy, MD 11/28/14 [phone]

## 2014-11-28 NOTE — ED Notes (Signed)
Pt reports earlier this morning she had a sharp pain in left jaw and left side of chest that lasted a few minutes, at that time pain 7/10. At present pain 1/10. Reports some SOB, able to speak in full sentences. Denies n/v/d.

## 2014-11-28 NOTE — Discharge Instructions (Signed)

## 2014-11-28 NOTE — ED Notes (Signed)
I went into room to draw labs. Patient put arm out so I could get labs. I tied tourniquet and palpated for a vein. Patient stated "the punching is what makes me sick." I palpated again and she stated "if you keep 'punching' I'm going to scream." I untied tourniquet and informed Stephanie(phlebotomy) that we needed labs. Stephanie at bedside

## 2014-12-09 ENCOUNTER — Telehealth: Payer: Self-pay | Admitting: Cardiology

## 2014-12-09 DIAGNOSIS — R079 Chest pain, unspecified: Secondary | ICD-10-CM

## 2014-12-09 NOTE — Telephone Encounter (Signed)
No need to see in office first for office visit.  Go ahead and schedule her for a walking Lexiscan Myoview.  Diagnosis is chest pain uncertain etiology and she also has a meniscus problem of the knee which will prevent her from walking rapidly on the treadmill.

## 2014-12-09 NOTE — Telephone Encounter (Signed)
ED notes mention possibly having stress testing.  Will ask Dr Mare Ferrari if he wants to order and if so what type or if he wants to see her in the office first.

## 2014-12-09 NOTE — Telephone Encounter (Signed)
New message      Pt was seen in ER on 11-28-14.  Per Maudie Mercury at [facility] from here was supposed to call pt and schedule an ER follow up.  Maudie Mercury will fax ER notes to [place] but want a nurse to call her an let her know when pt can be seen here

## 2014-12-09 NOTE — Telephone Encounter (Signed)
Left message for [name] that DrBrackbill gave orders for the stress test and that the pt will be called to schedule.  Spoke with pt who is aware she will be called.  She does state she does not feel like she will be able to walk the GXT.  Reviewed instructions for [name] with pt by phone.  She states understanding.

## 2014-12-10 ENCOUNTER — Ambulatory Visit (HOSPITAL_COMMUNITY): Payer: Medicare Other | Attending: Cardiology | Admitting: Radiology

## 2014-12-10 DIAGNOSIS — I10 Essential (primary) hypertension: Secondary | ICD-10-CM | POA: Insufficient documentation

## 2014-12-10 DIAGNOSIS — R079 Chest pain, unspecified: Secondary | ICD-10-CM | POA: Insufficient documentation

## 2014-12-10 DIAGNOSIS — R5383 Other fatigue: Secondary | ICD-10-CM | POA: Insufficient documentation

## 2014-12-10 MED ORDER — TECHNETIUM TC 99M SESTAMIBI GENERIC - CARDIOLITE
30.0000 | Freq: Once | INTRAVENOUS | Status: AC | PRN
  Administered 2014-12-10: 30 via INTRAVENOUS

## 2014-12-10 MED ORDER — REGADENOSON 0.4 MG/5ML IV SOLN
0.4000 mg | Freq: Once | INTRAVENOUS | Status: AC
Start: 2014-12-10 — End: 2014-12-10
  Administered 2014-12-10: 0.4 mg via INTRAVENOUS

## 2014-12-10 MED ORDER — TECHNETIUM TC 99M SESTAMIBI GENERIC - CARDIOLITE
10.0000 | Freq: Once | INTRAVENOUS | Status: AC | PRN
  Administered 2014-12-10: 10 via INTRAVENOUS

## 2014-12-10 MED ORDER — AMINOPHYLLINE 25 MG/ML IV SOLN
75.0000 mg | Freq: Once | INTRAVENOUS | Status: AC
  Administered 2014-12-10: 75 mg via INTRAVENOUS

## 2014-12-10 NOTE — Progress Notes (Signed)
[facility] [address] [place], [place] [address] [phone]    Cardiology Nuclear Med Study  SRAH AKE is a 68 y.o. female     MRN : [id number]     DOB: [birth year]  Procedure Date: 12/10/2014  Nuclear Med Background Indication for Stress Test:  Evaluation for Ischemia and [facility] History:  07/19/06 MPI: EF: 70% NL Cardiac Risk Factors: Lipids  Symptoms:  Chest Pain and Fatigue   Nuclear Pre-Procedure Caffeine/Decaff Intake:  None NPO After: 8:30pm   Lungs:  clear O2 Sat: 97% on room air. IV 0.9% NS with Angio Cath:  22g  IV Site: R Antecubital  IV Started by:  Perrin Maltese, EMT-P  Chest Size (in):  42 Cup Size: C  Height: 5\' 8"  (1.727 m)  Weight:  186 lb (84.369 kg)  BMI:  Body mass index is 28.29 kg/(m^2). Tech Comments:  Aminophylline 75 mg given for symptoms. All were resolved before leaving. S.Williams EMTP    Nuclear Med Study 1 or 2 day study: 1 day  Stress Test Type:  Carlton Adam  Reading MD: n/a  Order Authorizing Provider:  T.Shenika Quint MD  Resting Radionuclide: Technetium 5m Sestamibi  Resting Radionuclide Dose: 11.0 mCi   Stress Radionuclide:  Technetium 48m Sestamibi  Stress Radionuclide Dose: 33.0 mCi           Stress Protocol Rest HR: 67 Stress HR: 116  Rest BP: 152/89 Stress BP: 148/70  Exercise Time (min): n/a METS: n/a   Predicted Max HR: 152 bpm % Max HR: 76.32 bpm Rate Pressure Product: 17632   Dose of Adenosine (mg):  n/a Dose of Lexiscan: 0.4 mg  Dose of Atropine (mg): n/a Dose of Dobutamine: n/a mcg/kg/min (at max HR)  Stress Test Technologist: Perrin Maltese, EMT-P  Nuclear Technologist:  Vedia Pereyra, CNMT     Rest Procedure:  Myocardial perfusion imaging was performed at rest 45 minutes following the intravenous administration of Technetium 31m Sestamibi. Rest ECG: NSR - Normal EKG  Stress Procedure:  The patient received IV Lexiscan 0.4 mg over 15-seconds.  Technetium 66m Sestamibi  injected at 30-seconds. This patient had sob, chest tightness/pressure, nausea, and was lt. Headed with the Lexiscan injection. Quantitative spect images were obtained after a 45 minute delay. Stress ECG: No significant change from baseline ECG  QPS Raw Data Images:  Normal; no motion artifact; normal heart/lung ratio. Stress Images:  Normal homogeneous uptake in all areas of the myocardium. Rest Images:  Normal homogeneous uptake in all areas of the myocardium. Subtraction (SDS):  No evidence of ischemia. Transient Ischemic Dilatation (Normal <1.22):  1.04 Lung/Heart Ratio (Normal <0.45):  0.16  Quantitative Gated Spect Images QGS EDV:  75 ml QGS ESV:  27 ml  Impression Exercise Capacity:  Lexiscan with no exercise. BP Response:  Normal blood pressure response. Clinical Symptoms:  Mild chest pain/dyspnea. ECG Impression:  No significant ST segment change suggestive of ischemia. Comparison with Prior Nuclear Study: No significant change from previous study  Overall Impression:  Normal stress nuclear study.  LV Ejection Fraction: 64%.  LV Wall Motion:  NL LV Function; NL Wall Motion  Darlin Coco MD

## 2014-12-17 ENCOUNTER — Encounter (HOSPITAL_COMMUNITY): Admission: RE | Payer: Self-pay | Source: Ambulatory Visit

## 2014-12-17 ENCOUNTER — Ambulatory Visit (HOSPITAL_COMMUNITY): Admission: RE | Admit: 2014-12-17 | Payer: Medicare Other | Source: Ambulatory Visit | Admitting: Orthopedic Surgery

## 2014-12-17 SURGERY — ARTHROSCOPY, KNEE
Anesthesia: Choice | Site: Knee | Laterality: Right

## 2015-01-01 ENCOUNTER — Other Ambulatory Visit: Payer: Self-pay | Admitting: Internal Medicine

## 2015-02-05 DIAGNOSIS — Z008 Encounter for other general examination: Secondary | ICD-10-CM | POA: Diagnosis not present

## 2015-02-05 DIAGNOSIS — R739 Hyperglycemia, unspecified: Secondary | ICD-10-CM | POA: Diagnosis not present

## 2015-02-05 DIAGNOSIS — E039 Hypothyroidism, unspecified: Secondary | ICD-10-CM | POA: Diagnosis not present

## 2015-02-05 DIAGNOSIS — R358 Other polyuria: Secondary | ICD-10-CM | POA: Diagnosis not present

## 2015-02-05 DIAGNOSIS — E785 Hyperlipidemia, unspecified: Secondary | ICD-10-CM | POA: Diagnosis not present

## 2015-02-12 DIAGNOSIS — Z6829 Body mass index (BMI) 29.0-29.9, adult: Secondary | ICD-10-CM | POA: Diagnosis not present

## 2015-02-12 DIAGNOSIS — E785 Hyperlipidemia, unspecified: Secondary | ICD-10-CM | POA: Diagnosis not present

## 2015-02-12 DIAGNOSIS — R739 Hyperglycemia, unspecified: Secondary | ICD-10-CM | POA: Diagnosis not present

## 2015-02-12 DIAGNOSIS — B0229 Other postherpetic nervous system involvement: Secondary | ICD-10-CM | POA: Diagnosis not present

## 2015-02-12 DIAGNOSIS — K219 Gastro-esophageal reflux disease without esophagitis: Secondary | ICD-10-CM | POA: Diagnosis not present

## 2015-02-12 DIAGNOSIS — Z1212 Encounter for screening for malignant neoplasm of rectum: Secondary | ICD-10-CM | POA: Diagnosis not present

## 2015-02-12 DIAGNOSIS — Z1389 Encounter for screening for other disorder: Secondary | ICD-10-CM | POA: Diagnosis not present

## 2015-02-12 DIAGNOSIS — Z0000 Encounter for general adult medical examination without abnormal findings: Secondary | ICD-10-CM | POA: Diagnosis not present

## 2015-02-12 DIAGNOSIS — J309 Allergic rhinitis, unspecified: Secondary | ICD-10-CM | POA: Diagnosis not present

## 2015-02-12 DIAGNOSIS — Z23 Encounter for immunization: Secondary | ICD-10-CM | POA: Diagnosis not present

## 2015-02-12 DIAGNOSIS — E039 Hypothyroidism, unspecified: Secondary | ICD-10-CM | POA: Diagnosis not present

## 2015-02-12 DIAGNOSIS — R079 Chest pain, unspecified: Secondary | ICD-10-CM | POA: Diagnosis not present

## 2015-02-18 DIAGNOSIS — R921 Mammographic calcification found on diagnostic imaging of breast: Secondary | ICD-10-CM | POA: Diagnosis not present

## 2015-02-19 DIAGNOSIS — D1801 Hemangioma of skin and subcutaneous tissue: Secondary | ICD-10-CM | POA: Diagnosis not present

## 2015-02-19 DIAGNOSIS — L820 Inflamed seborrheic keratosis: Secondary | ICD-10-CM | POA: Diagnosis not present

## 2015-02-19 DIAGNOSIS — L821 Other seborrheic keratosis: Secondary | ICD-10-CM | POA: Diagnosis not present

## 2015-04-16 DIAGNOSIS — L720 Epidermal cyst: Secondary | ICD-10-CM | POA: Diagnosis not present

## 2015-04-16 DIAGNOSIS — L820 Inflamed seborrheic keratosis: Secondary | ICD-10-CM | POA: Diagnosis not present

## 2015-05-05 DIAGNOSIS — M1711 Unilateral primary osteoarthritis, right knee: Secondary | ICD-10-CM | POA: Diagnosis not present

## 2015-05-05 DIAGNOSIS — M170 Bilateral primary osteoarthritis of knee: Secondary | ICD-10-CM | POA: Diagnosis not present

## 2015-05-05 DIAGNOSIS — M1712 Unilateral primary osteoarthritis, left knee: Secondary | ICD-10-CM | POA: Diagnosis not present

## 2015-05-12 DIAGNOSIS — H25813 Combined forms of age-related cataract, bilateral: Secondary | ICD-10-CM | POA: Diagnosis not present

## 2015-05-29 DIAGNOSIS — L820 Inflamed seborrheic keratosis: Secondary | ICD-10-CM | POA: Diagnosis not present

## 2015-06-17 DIAGNOSIS — L720 Epidermal cyst: Secondary | ICD-10-CM | POA: Diagnosis not present

## 2015-07-28 ENCOUNTER — Telehealth: Payer: Self-pay | Admitting: Internal Medicine

## 2015-07-28 ENCOUNTER — Encounter (HOSPITAL_COMMUNITY): Payer: Self-pay | Admitting: Emergency Medicine

## 2015-07-28 ENCOUNTER — Emergency Department (HOSPITAL_COMMUNITY)
Admission: EM | Admit: 2015-07-28 | Discharge: 2015-07-28 | Discharge status: Against Medical Advice | Payer: Medicare Other | Attending: Emergency Medicine | Admitting: Emergency Medicine

## 2015-07-28 DIAGNOSIS — R109 Unspecified abdominal pain: Secondary | ICD-10-CM | POA: Insufficient documentation

## 2015-07-28 DIAGNOSIS — R1084 Generalized abdominal pain: Secondary | ICD-10-CM | POA: Diagnosis not present

## 2015-07-28 LAB — CBC
HCT: 40.2 % (ref 36.0–46.0)
Hemoglobin: 12.9 g/dL (ref 12.0–15.0)
MCH: 28.5 pg (ref 26.0–34.0)
MCHC: 32.1 g/dL (ref 30.0–36.0)
MCV: 88.7 fL (ref 78.0–100.0)
PLATELETS: 268 10*3/uL (ref 150–400)
RBC: 4.53 MIL/uL (ref 3.87–5.11)
RDW: 14 % (ref 11.5–15.5)
WBC: 11.4 10*3/uL — AB (ref 4.0–10.5)

## 2015-07-28 LAB — COMPREHENSIVE METABOLIC PANEL
ALT: 42 U/L (ref 14–54)
AST: 57 U/L — AB (ref 15–41)
Albumin: 4.7 g/dL (ref 3.5–5.0)
Alkaline Phosphatase: 93 U/L (ref 38–126)
Anion gap: 7 (ref 5–15)
BILIRUBIN TOTAL: 0.7 mg/dL (ref 0.3–1.2)
BUN: 19 mg/dL (ref 6–20)
CO2: 28 mmol/L (ref 22–32)
CREATININE: 0.86 mg/dL (ref 0.44–1.00)
Calcium: 10.5 mg/dL — ABNORMAL HIGH (ref 8.9–10.3)
Chloride: 106 mmol/L (ref 101–111)
Glucose, Bld: 108 mg/dL — ABNORMAL HIGH (ref 65–99)
Potassium: 4.9 mmol/L (ref 3.5–5.1)
Sodium: 141 mmol/L (ref 135–145)
TOTAL PROTEIN: 7.3 g/dL (ref 6.5–8.1)

## 2015-07-28 LAB — LIPASE, BLOOD: Lipase: 256 U/L — ABNORMAL HIGH (ref 22–51)

## 2015-07-28 NOTE — ED Notes (Signed)
Pt. Told registration that she was leaving

## 2015-07-28 NOTE — Telephone Encounter (Signed)
Spoke with patient and told her she needs to be evaluated by ED first. They will contact GI if consult is needed.

## 2015-07-28 NOTE — ED Notes (Addendum)
Pt c/o abd pain that happened earlier today which have lessened since.  Pt states that she has had spouts of n/v/d.  Pt states she is a pt of Dr Olevia Perches with GI and has put in a call to her.   Pt has hiatal hernia

## 2015-08-03 DIAGNOSIS — R109 Unspecified abdominal pain: Secondary | ICD-10-CM | POA: Diagnosis not present

## 2015-08-03 DIAGNOSIS — K859 Acute pancreatitis, unspecified: Secondary | ICD-10-CM | POA: Diagnosis not present

## 2015-08-03 DIAGNOSIS — Z6824 Body mass index (BMI) 24.0-24.9, adult: Secondary | ICD-10-CM | POA: Diagnosis not present

## 2015-08-03 DIAGNOSIS — R197 Diarrhea, unspecified: Secondary | ICD-10-CM | POA: Diagnosis not present

## 2015-08-03 DIAGNOSIS — R112 Nausea with vomiting, unspecified: Secondary | ICD-10-CM | POA: Diagnosis not present

## 2015-08-04 ENCOUNTER — Other Ambulatory Visit: Payer: Self-pay | Admitting: Internal Medicine

## 2015-08-04 DIAGNOSIS — R7989 Other specified abnormal findings of blood chemistry: Secondary | ICD-10-CM

## 2015-08-04 DIAGNOSIS — R945 Abnormal results of liver function studies: Secondary | ICD-10-CM

## 2015-08-04 DIAGNOSIS — R197 Diarrhea, unspecified: Secondary | ICD-10-CM

## 2015-08-04 DIAGNOSIS — R112 Nausea with vomiting, unspecified: Secondary | ICD-10-CM

## 2015-08-04 DIAGNOSIS — R109 Unspecified abdominal pain: Secondary | ICD-10-CM

## 2015-08-06 ENCOUNTER — Ambulatory Visit
Admission: RE | Admit: 2015-08-06 | Discharge: 2015-08-06 | Disposition: A | Discharge status: Home / Self Care | Payer: Medicare Other | Source: Ambulatory Visit | Attending: Internal Medicine | Admitting: Internal Medicine

## 2015-08-06 DIAGNOSIS — R945 Abnormal results of liver function studies: Secondary | ICD-10-CM

## 2015-08-06 DIAGNOSIS — R7989 Other specified abnormal findings of blood chemistry: Secondary | ICD-10-CM

## 2015-08-06 DIAGNOSIS — R112 Nausea with vomiting, unspecified: Secondary | ICD-10-CM

## 2015-08-06 DIAGNOSIS — R197 Diarrhea, unspecified: Secondary | ICD-10-CM

## 2015-08-06 DIAGNOSIS — R109 Unspecified abdominal pain: Secondary | ICD-10-CM

## 2015-08-06 MED ORDER — IOPAMIDOL (ISOVUE-300) INJECTION 61%
100.0000 mL | Freq: Once | INTRAVENOUS | Status: AC | PRN
  Administered 2015-08-06: 100 mL via INTRAVENOUS

## 2015-08-07 DIAGNOSIS — N1330 Unspecified hydronephrosis: Secondary | ICD-10-CM | POA: Diagnosis not present

## 2015-08-07 DIAGNOSIS — N201 Calculus of ureter: Secondary | ICD-10-CM | POA: Diagnosis not present

## 2015-08-10 ENCOUNTER — Other Ambulatory Visit: Payer: Self-pay | Admitting: Urology

## 2015-08-10 DIAGNOSIS — N201 Calculus of ureter: Secondary | ICD-10-CM | POA: Diagnosis not present

## 2015-08-11 ENCOUNTER — Encounter (HOSPITAL_COMMUNITY): Payer: Self-pay | Admitting: *Deleted

## 2015-08-13 ENCOUNTER — Encounter (HOSPITAL_COMMUNITY): Payer: Self-pay | Admitting: General Practice

## 2015-08-13 ENCOUNTER — Ambulatory Visit (HOSPITAL_COMMUNITY): Payer: Medicare Other

## 2015-08-13 ENCOUNTER — Ambulatory Visit (HOSPITAL_COMMUNITY)
Admission: RE | Admit: 2015-08-13 | Discharge: 2015-08-13 | Disposition: A | Discharge status: Home / Self Care | Payer: Medicare Other | Source: Ambulatory Visit | Attending: Urology | Admitting: Urology

## 2015-08-13 ENCOUNTER — Encounter (HOSPITAL_COMMUNITY)
Admission: RE | Disposition: A | Discharge status: Home / Self Care | Payer: Self-pay | Source: Ambulatory Visit | Attending: Urology

## 2015-08-13 DIAGNOSIS — N281 Cyst of kidney, acquired: Secondary | ICD-10-CM | POA: Diagnosis not present

## 2015-08-13 DIAGNOSIS — E785 Hyperlipidemia, unspecified: Secondary | ICD-10-CM | POA: Insufficient documentation

## 2015-08-13 DIAGNOSIS — K219 Gastro-esophageal reflux disease without esophagitis: Secondary | ICD-10-CM | POA: Diagnosis not present

## 2015-08-13 DIAGNOSIS — K449 Diaphragmatic hernia without obstruction or gangrene: Secondary | ICD-10-CM | POA: Diagnosis not present

## 2015-08-13 DIAGNOSIS — N201 Calculus of ureter: Secondary | ICD-10-CM | POA: Diagnosis not present

## 2015-08-13 DIAGNOSIS — E039 Hypothyroidism, unspecified: Secondary | ICD-10-CM | POA: Diagnosis not present

## 2015-08-13 HISTORY — DX: Other complications of anesthesia, initial encounter: T88.59XA

## 2015-08-13 HISTORY — DX: Adverse effect of unspecified anesthetic, initial encounter: T41.45XA

## 2015-08-13 SURGERY — LITHOTRIPSY, ESWL
Anesthesia: LOCAL | Laterality: Left

## 2015-08-13 MED ORDER — DIAZEPAM 5 MG PO TABS
10.0000 mg | ORAL_TABLET | ORAL | Status: AC
  Administered 2015-08-13: 10 mg via ORAL
  Filled 2015-08-13: qty 2

## 2015-08-13 MED ORDER — ONDANSETRON HCL 4 MG PO TABS: 4.0000 mg | ORAL_TABLET | Freq: Three times a day (TID) | ORAL | Status: DC | PRN

## 2015-08-13 MED ORDER — TAMSULOSIN HCL 0.4 MG PO CAPS
0.4000 mg | ORAL_CAPSULE | Freq: Every day | ORAL | Status: DC
Start: 2015-08-13 — End: 2016-10-25

## 2015-08-13 MED ORDER — SODIUM CHLORIDE 0.9 % IV SOLN
INTRAVENOUS | Status: DC
  Administered 2015-08-13: 07:00:00 via INTRAVENOUS

## 2015-08-13 MED ORDER — OXYCODONE-ACETAMINOPHEN 5-325 MG PO TABS: 1.0000 | ORAL_TABLET | ORAL | Status: DC | PRN

## 2015-08-13 MED ORDER — DIPHENHYDRAMINE HCL 25 MG PO CAPS
25.0000 mg | ORAL_CAPSULE | ORAL | Status: AC
  Administered 2015-08-13: 25 mg via ORAL
  Filled 2015-08-13: qty 1

## 2015-08-13 MED ORDER — CIPROFLOXACIN HCL 500 MG PO TABS
500.0000 mg | ORAL_TABLET | ORAL | Status: AC
  Administered 2015-08-13: 500 mg via ORAL
  Filled 2015-08-13: qty 1

## 2015-08-13 NOTE — Discharge Instructions (Signed)
1 - You may have urinary urgency (bladder spasms), bloody urine on / off, and pass small stone fragments for several days. This is normal.  2 - Call MD or go to ER for fever >102, severe pain / nausea / vomiting not relieved by medications, or acute change in medical status

## 2015-08-13 NOTE — H&P (Signed)
[name] is an 69 y.o. female.    Chief Complaint: Pre-OP Left Shockwave Lithotripsy  HPI:   1 - Left Ureteral Stone -  [name] is a [phone] here for followup for L ureteral calculi. Currently she is asymptomatic. She took mag citrate and miralax over the weekend to resolve her constipation and retained GI constrast.   Stone is 21mm and lateral to L4 area.   She denies LUTS. She denies fevers/chills/sweats. She denies nausea/vomiting  Past Medical History  Diagnosis Date  . Insomnia   . Depression   . Hyperlipemia   . Constipation   . Cough   . GERD (gastroesophageal reflux disease)   . Hypothyroidism   . Renal cyst   . Gallstones   . Elevated liver function tests   . Hiatal hernia   . Esophageal stricture   . Complication of anesthesia     aspiration pna    Past Surgical History  Procedure Laterality Date  . Tooth extraction    . Nasal sinus surgery    . Inguinal herniography    . T & a    . Dilation and curettage of uterus    . Cholecystectomy  12/30/10  . US echocardiography  07/17/2006    EF 55-60%  . Cardiovascular stress test  07/19/2006    EF 70%, NO EVIDENCE OF ISCHEMIA  . Colonoscopy      Family History  Problem Relation Age of Onset  . Breast cancer Mother   . Colon cancer Neg Hx   . Stomach cancer Neg Hx   . Dementia Father    Social History:  reports that she has never smoked. She has never used smokeless tobacco. She reports that she does not drink alcohol or use illicit drugs.  Allergies:  Allergies  Allergen Reactions  . Acetaminophen     Pt has hiatal hernia.   . Codeine Nausea And Vomiting  . Hydrocodone-Acetaminophen Nausea And Vomiting    No prescriptions prior to admission    No results found for this or any previous visit (from the past 48 hour(s)). No results found.  Review of Systems  Constitutional: Negative.  Negative for fever.  HENT: Negative.   Eyes: Negative.   Respiratory: Negative.   Cardiovascular: Negative.    Gastrointestinal: Negative.   Genitourinary: Positive for flank pain.  Musculoskeletal: Negative.   Skin: Negative.   Neurological: Negative.   Endo/Heme/Allergies: Negative.   Psychiatric/Behavioral: Negative.     Height 5\' 8"  (1.727 m), weight 73.483 kg (162 lb). Physical Exam  Constitutional: She appears well-developed.  HENT:  Head: Normocephalic.  Eyes: Pupils are equal, round, and reactive to light.  Neck: Normal range of motion.  Cardiovascular: Normal rate.   Respiratory: Effort normal.  GI: Soft.  Genitourinary:  Mild left CVAT  Musculoskeletal: Normal range of motion.  Neurological: She is alert.  Skin: Skin is warm.  Psychiatric: She has a normal mood and affect. Her behavior is normal. Judgment and thought content normal.     Assessment/Plan  1 - Left Ureteral Stone -  We discussed shockwave lithotripsy in detail as well as my "rule of 9s" with stones  &lt;24mm, less than 900 HU, and skin to stone distance &lt;9cm having approximately 90%  treatment success with single session of treatment. We then addressed how stones that are  larger, more dense, and in patients with less favorable anatomy have incrementally  decreased success rates. We discussed risks including, bleeding, infection, hematoma,  loss of kidney,  need for staged therapy, need for adjunctive therapy and requirement to  refrain from any anticoagulants, anti-platelet or aspirin-like products peri-procedureally.  After careful consideration, the patient has chosen to proceed.   [name] 08/13/2015, 5:49 AM

## 2015-08-20 DIAGNOSIS — E785 Hyperlipidemia, unspecified: Secondary | ICD-10-CM | POA: Diagnosis not present

## 2015-08-20 DIAGNOSIS — E663 Overweight: Secondary | ICD-10-CM | POA: Diagnosis not present

## 2015-08-20 DIAGNOSIS — N134 Hydroureter: Secondary | ICD-10-CM | POA: Diagnosis not present

## 2015-08-20 DIAGNOSIS — N1330 Unspecified hydronephrosis: Secondary | ICD-10-CM | POA: Diagnosis not present

## 2015-08-20 DIAGNOSIS — I700 Atherosclerosis of aorta: Secondary | ICD-10-CM | POA: Diagnosis not present

## 2015-08-20 DIAGNOSIS — Z6824 Body mass index (BMI) 24.0-24.9, adult: Secondary | ICD-10-CM | POA: Diagnosis not present

## 2015-08-20 DIAGNOSIS — K219 Gastro-esophageal reflux disease without esophagitis: Secondary | ICD-10-CM | POA: Diagnosis not present

## 2015-08-20 DIAGNOSIS — N200 Calculus of kidney: Secondary | ICD-10-CM | POA: Diagnosis not present

## 2015-08-20 DIAGNOSIS — R739 Hyperglycemia, unspecified: Secondary | ICD-10-CM | POA: Diagnosis not present

## 2015-08-20 DIAGNOSIS — E039 Hypothyroidism, unspecified: Secondary | ICD-10-CM | POA: Diagnosis not present

## 2015-08-20 DIAGNOSIS — Z23 Encounter for immunization: Secondary | ICD-10-CM | POA: Diagnosis not present

## 2015-08-27 DIAGNOSIS — M1711 Unilateral primary osteoarthritis, right knee: Secondary | ICD-10-CM | POA: Diagnosis not present

## 2015-09-03 DIAGNOSIS — N201 Calculus of ureter: Secondary | ICD-10-CM | POA: Diagnosis not present

## 2016-02-11 DIAGNOSIS — M170 Bilateral primary osteoarthritis of knee: Secondary | ICD-10-CM | POA: Diagnosis not present

## 2016-02-11 DIAGNOSIS — M1712 Unilateral primary osteoarthritis, left knee: Secondary | ICD-10-CM | POA: Diagnosis not present

## 2016-02-11 DIAGNOSIS — M1711 Unilateral primary osteoarthritis, right knee: Secondary | ICD-10-CM | POA: Diagnosis not present

## 2016-02-15 DIAGNOSIS — M7631 Iliotibial band syndrome, right leg: Secondary | ICD-10-CM | POA: Diagnosis not present

## 2016-03-01 DIAGNOSIS — M7631 Iliotibial band syndrome, right leg: Secondary | ICD-10-CM | POA: Diagnosis not present

## 2016-03-01 DIAGNOSIS — R928 Other abnormal and inconclusive findings on diagnostic imaging of breast: Secondary | ICD-10-CM | POA: Diagnosis not present

## 2016-03-01 DIAGNOSIS — Z803 Family history of malignant neoplasm of breast: Secondary | ICD-10-CM | POA: Diagnosis not present

## 2016-03-03 DIAGNOSIS — M7631 Iliotibial band syndrome, right leg: Secondary | ICD-10-CM | POA: Diagnosis not present

## 2016-03-04 DIAGNOSIS — Z6823 Body mass index (BMI) 23.0-23.9, adult: Secondary | ICD-10-CM | POA: Diagnosis not present

## 2016-03-04 DIAGNOSIS — R05 Cough: Secondary | ICD-10-CM | POA: Diagnosis not present

## 2016-03-04 DIAGNOSIS — J209 Acute bronchitis, unspecified: Secondary | ICD-10-CM | POA: Diagnosis not present

## 2016-03-07 DIAGNOSIS — M7631 Iliotibial band syndrome, right leg: Secondary | ICD-10-CM | POA: Diagnosis not present

## 2016-03-07 DIAGNOSIS — E784 Other hyperlipidemia: Secondary | ICD-10-CM | POA: Diagnosis not present

## 2016-03-07 DIAGNOSIS — E663 Overweight: Secondary | ICD-10-CM | POA: Diagnosis not present

## 2016-03-07 DIAGNOSIS — E038 Other specified hypothyroidism: Secondary | ICD-10-CM | POA: Diagnosis not present

## 2016-03-07 DIAGNOSIS — R8290 Unspecified abnormal findings in urine: Secondary | ICD-10-CM | POA: Diagnosis not present

## 2016-03-07 DIAGNOSIS — R739 Hyperglycemia, unspecified: Secondary | ICD-10-CM | POA: Diagnosis not present

## 2016-03-09 DIAGNOSIS — M7631 Iliotibial band syndrome, right leg: Secondary | ICD-10-CM | POA: Diagnosis not present

## 2016-03-11 DIAGNOSIS — M7631 Iliotibial band syndrome, right leg: Secondary | ICD-10-CM | POA: Diagnosis not present

## 2016-03-14 DIAGNOSIS — I700 Atherosclerosis of aorta: Secondary | ICD-10-CM | POA: Diagnosis not present

## 2016-03-14 DIAGNOSIS — F325 Major depressive disorder, single episode, in full remission: Secondary | ICD-10-CM | POA: Diagnosis not present

## 2016-03-14 DIAGNOSIS — Z1389 Encounter for screening for other disorder: Secondary | ICD-10-CM | POA: Diagnosis not present

## 2016-03-14 DIAGNOSIS — R3129 Other microscopic hematuria: Secondary | ICD-10-CM | POA: Diagnosis not present

## 2016-03-14 DIAGNOSIS — Z0000 Encounter for general adult medical examination without abnormal findings: Secondary | ICD-10-CM | POA: Diagnosis not present

## 2016-03-14 DIAGNOSIS — R05 Cough: Secondary | ICD-10-CM | POA: Diagnosis not present

## 2016-03-14 DIAGNOSIS — H04129 Dry eye syndrome of unspecified lacrimal gland: Secondary | ICD-10-CM | POA: Diagnosis not present

## 2016-03-14 DIAGNOSIS — E038 Other specified hypothyroidism: Secondary | ICD-10-CM | POA: Diagnosis not present

## 2016-03-14 DIAGNOSIS — Z6824 Body mass index (BMI) 24.0-24.9, adult: Secondary | ICD-10-CM | POA: Diagnosis not present

## 2016-03-14 DIAGNOSIS — N951 Menopausal and female climacteric states: Secondary | ICD-10-CM | POA: Diagnosis not present

## 2016-03-14 DIAGNOSIS — K219 Gastro-esophageal reflux disease without esophagitis: Secondary | ICD-10-CM | POA: Diagnosis not present

## 2016-03-14 DIAGNOSIS — N200 Calculus of kidney: Secondary | ICD-10-CM | POA: Diagnosis not present

## 2016-03-15 DIAGNOSIS — M7631 Iliotibial band syndrome, right leg: Secondary | ICD-10-CM | POA: Diagnosis not present

## 2016-04-12 DIAGNOSIS — L820 Inflamed seborrheic keratosis: Secondary | ICD-10-CM | POA: Diagnosis not present

## 2016-04-12 DIAGNOSIS — D1801 Hemangioma of skin and subcutaneous tissue: Secondary | ICD-10-CM | POA: Diagnosis not present

## 2016-04-12 DIAGNOSIS — L821 Other seborrheic keratosis: Secondary | ICD-10-CM | POA: Diagnosis not present

## 2016-04-12 DIAGNOSIS — L905 Scar conditions and fibrosis of skin: Secondary | ICD-10-CM | POA: Diagnosis not present

## 2016-04-12 DIAGNOSIS — D235 Other benign neoplasm of skin of trunk: Secondary | ICD-10-CM | POA: Diagnosis not present

## 2016-04-12 DIAGNOSIS — L814 Other melanin hyperpigmentation: Secondary | ICD-10-CM | POA: Diagnosis not present

## 2016-05-02 ENCOUNTER — Other Ambulatory Visit: Payer: Self-pay | Admitting: Obstetrics and Gynecology

## 2016-05-02 DIAGNOSIS — Z124 Encounter for screening for malignant neoplasm of cervix: Secondary | ICD-10-CM | POA: Diagnosis not present

## 2016-05-03 LAB — CYTOLOGY - PAP

## 2016-05-12 DIAGNOSIS — H52223 Regular astigmatism, bilateral: Secondary | ICD-10-CM | POA: Diagnosis not present

## 2016-05-12 DIAGNOSIS — H04123 Dry eye syndrome of bilateral lacrimal glands: Secondary | ICD-10-CM | POA: Diagnosis not present

## 2016-05-12 DIAGNOSIS — H5213 Myopia, bilateral: Secondary | ICD-10-CM | POA: Diagnosis not present

## 2016-05-12 DIAGNOSIS — H43813 Vitreous degeneration, bilateral: Secondary | ICD-10-CM | POA: Diagnosis not present

## 2016-06-10 DIAGNOSIS — M170 Bilateral primary osteoarthritis of knee: Secondary | ICD-10-CM | POA: Diagnosis not present

## 2016-07-21 DIAGNOSIS — L820 Inflamed seborrheic keratosis: Secondary | ICD-10-CM | POA: Diagnosis not present

## 2016-08-22 ENCOUNTER — Emergency Department (HOSPITAL_COMMUNITY)
Admission: EM | Admit: 2016-08-22 | Discharge: 2016-08-23 | Disposition: A | Discharge status: Home / Self Care | Payer: Medicare Other | Attending: Emergency Medicine | Admitting: Emergency Medicine

## 2016-08-22 ENCOUNTER — Ambulatory Visit (HOSPITAL_COMMUNITY)
Admission: EM | Admit: 2016-08-22 | Discharge: 2016-08-22 | Disposition: A | Discharge status: Home / Self Care | Payer: Medicare Other | Source: Home / Self Care | Attending: Family Medicine | Admitting: Family Medicine

## 2016-08-22 ENCOUNTER — Encounter (HOSPITAL_COMMUNITY): Payer: Self-pay | Admitting: Emergency Medicine

## 2016-08-22 DIAGNOSIS — R079 Chest pain, unspecified: Secondary | ICD-10-CM | POA: Diagnosis present

## 2016-08-22 DIAGNOSIS — Z79899 Other long term (current) drug therapy: Secondary | ICD-10-CM | POA: Diagnosis not present

## 2016-08-22 DIAGNOSIS — E039 Hypothyroidism, unspecified: Secondary | ICD-10-CM | POA: Insufficient documentation

## 2016-08-22 DIAGNOSIS — R42 Dizziness and giddiness: Secondary | ICD-10-CM

## 2016-08-22 DIAGNOSIS — Z7982 Long term (current) use of aspirin: Secondary | ICD-10-CM | POA: Diagnosis not present

## 2016-08-22 DIAGNOSIS — I951 Orthostatic hypotension: Secondary | ICD-10-CM

## 2016-08-22 DIAGNOSIS — M7918 Myalgia, other site: Secondary | ICD-10-CM

## 2016-08-22 DIAGNOSIS — R0602 Shortness of breath: Secondary | ICD-10-CM | POA: Insufficient documentation

## 2016-08-22 DIAGNOSIS — R9431 Abnormal electrocardiogram [ECG] [EKG]: Secondary | ICD-10-CM

## 2016-08-22 DIAGNOSIS — W19XXXA Unspecified fall, initial encounter: Secondary | ICD-10-CM

## 2016-08-22 LAB — BASIC METABOLIC PANEL
Anion gap: 8 (ref 5–15)
BUN: 15 mg/dL (ref 6–20)
CHLORIDE: 106 mmol/L (ref 101–111)
CO2: 24 mmol/L (ref 22–32)
CREATININE: 0.73 mg/dL (ref 0.44–1.00)
Calcium: 9.8 mg/dL (ref 8.9–10.3)
GFR calc Af Amer: >60 mL/min (ref >60)
GFR calc non Af Amer: >60 mL/min (ref >60)
GLUCOSE: 101 mg/dL — AB (ref 65–99)
POTASSIUM: 4.7 mmol/L (ref 3.5–5.1)
SODIUM: 138 mmol/L (ref 135–145)

## 2016-08-22 LAB — URINE MICROSCOPIC-ADD ON

## 2016-08-22 LAB — POCT I-STAT, CHEM 8
BUN: 17 mg/dL (ref 6–20)
CALCIUM ION: 1.26 mmol/L (ref 1.15–1.40)
CHLORIDE: 105 mmol/L (ref 101–111)
CREATININE: 0.7 mg/dL (ref 0.44–1.00)
GLUCOSE: 100 mg/dL — AB (ref 65–99)
HCT: 40 % (ref 36.0–46.0)
Hemoglobin: 13.6 g/dL (ref 12.0–15.0)
Potassium: 4.1 mmol/L (ref 3.5–5.1)
SODIUM: 140 mmol/L (ref 135–145)
TCO2: 27 mmol/L (ref 0–100)

## 2016-08-22 LAB — POCT URINALYSIS DIP (DEVICE)
Bilirubin Urine: NEGATIVE
Glucose, UA: NEGATIVE mg/dL
KETONES UR: NEGATIVE mg/dL
Nitrite: NEGATIVE
PH: 6 (ref 5.0–8.0)
PROTEIN: NEGATIVE mg/dL
Specific Gravity, Urine: 1.02 (ref 1.005–1.030)
Urobilinogen, UA: 0.2 mg/dL (ref 0.0–1.0)

## 2016-08-22 LAB — URINALYSIS, ROUTINE W REFLEX MICROSCOPIC
Bilirubin Urine: NEGATIVE
GLUCOSE, UA: NEGATIVE mg/dL
KETONES UR: NEGATIVE mg/dL
Nitrite: NEGATIVE
PH: 5.5 (ref 5.0–8.0)
PROTEIN: NEGATIVE mg/dL
Specific Gravity, Urine: 1.026 (ref 1.005–1.030)

## 2016-08-22 LAB — D-DIMER, QUANTITATIVE (NOT AT ARMC)

## 2016-08-22 LAB — CBC
HEMATOCRIT: 42.3 % (ref 36.0–46.0)
Hemoglobin: 13.7 g/dL (ref 12.0–15.0)
MCH: 29.3 pg (ref 26.0–34.0)
MCHC: 32.4 g/dL (ref 30.0–36.0)
MCV: 90.4 fL (ref 78.0–100.0)
PLATELETS: 269 10*3/uL (ref 150–400)
RBC: 4.68 MIL/uL (ref 3.87–5.11)
RDW: 13.3 % (ref 11.5–15.5)
WBC: 12.4 10*3/uL — ABNORMAL HIGH (ref 4.0–10.5)

## 2016-08-22 LAB — CBG MONITORING, ED: Glucose-Capillary: 74 mg/dL (ref 65–99)

## 2016-08-22 LAB — TROPONIN I

## 2016-08-22 MED ORDER — SODIUM CHLORIDE 0.9 % IV BOLUS (SEPSIS)
1000.0000 mL | Freq: Once | INTRAVENOUS | Status: AC
  Administered 2016-08-22: 1000 mL via INTRAVENOUS

## 2016-08-22 NOTE — ED Provider Notes (Signed)
[facility] DEPT Provider Note   CSN: [id number] Arrival date & time: 08/22/16  1706     History   Chief Complaint Chief Complaint  Patient presents with  . Chest Pain  . Dizziness    HPI [name] is a 70 y.o. female.  HPI   Patient presents with approximately 1 month of episodic lightheadedness, persistent central chest pain, DOE, near syncope.  Today she had two episodes and in one she fell while walking on the sidewalk without warning.  She has not noticed a spinning sensation until today.  The chest pain is central chest pain that feels like pressure, has been constant for many days, unable to tell me when it started.  Has also had dyspnea with exertion, particularly with walking up the stairs.   She did have a long trip to [place] in August, which exacerbated her symptoms but her symptoms were present before her trip.  Has been eating and drinking well.  States she doesn't look at her legs but denies noticing any swelling.   Past Medical History:  Diagnosis Date  . Complication of anesthesia    aspiration pna  . Constipation   . Cough   . Depression   . Elevated liver function tests   . Esophageal stricture   . Gallstones   . GERD (gastroesophageal reflux disease)   . Hiatal hernia   . Hyperlipemia   . Hypothyroidism   . Insomnia   . Renal cyst     Patient Active Problem List   Diagnosis Date Noted  . Infrapatellar bursitis of right knee 04/29/2014  . Pneumonia, aspiration ([facility]) 12/28/2012  . HYPOTHYROIDISM 11/15/2010  . HYPERLIPIDEMIA 11/15/2010  . DEPRESSION 11/15/2010  . GERD 11/15/2010  . CONSTIPATION 11/15/2010  . GALLSTONES 11/15/2010  . RENAL CYST 11/15/2010  . INSOMNIA UNSPECIFIED 11/15/2010  . COUGH 11/15/2010  . ABDOMINAL PAIN OTHER SPECIFIED SITE 11/15/2010  . TRANSAMINASES, SERUM, ELEVATED 11/15/2010    Past Surgical History:  Procedure Laterality Date  . CARDIOVASCULAR STRESS TEST  07/19/2006   EF 70%, NO EVIDENCE OF ISCHEMIA    . CHOLECYSTECTOMY  12/30/10  . COLONOSCOPY    . DILATION AND CURETTAGE OF UTERUS    . inguinal herniography    . NASAL SINUS SURGERY    . T & A    . TOOTH EXTRACTION    . US ECHOCARDIOGRAPHY  07/17/2006   EF 55-60%    OB History    No data available       Home Medications    Prior to Admission medications   Medication Sig Start Date End Date Taking? Authorizing Provider  aspirin EC 81 MG tablet Take 81 mg by mouth daily.   Yes Historical Provider, MD  B Complex-C (B-COMPLEX WITH VITAMIN C) tablet Take 1 tablet by mouth daily.   Yes Historical Provider, MD  Cholecalciferol (HM VITAMIN D3) 2000 UNITS CAPS Take 1 capsule by mouth daily.    Yes Historical Provider, MD  cycloSPORINE (RESTASIS) 0.05 % ophthalmic emulsion Place 1 drop into both eyes daily as needed. For dry eyes   Yes Historical Provider, MD  escitalopram (LEXAPRO) 10 MG tablet Take 10 mg by mouth daily.     Yes Historical Provider, MD  esomeprazole (NEXIUM) 40 MG capsule Take 40 mg by mouth 2 (two) times daily before a meal.   Yes Historical Provider, MD  ezetimibe (ZETIA) 10 MG tablet Take 10 mg by mouth at bedtime.   Yes Historical Provider, MD  ibuprofen (  ADVIL,MOTRIN) 200 MG tablet Take 400 mg by mouth every 6 (six) hours as needed for moderate pain.   Yes Historical Provider, MD  KRILL OIL PO Take 1 capsule by mouth daily.   Yes Historical Provider, MD  levothyroxine (SYNTHROID, LEVOTHROID) 50 MCG tablet Take 1 tablet (50 mcg total) by mouth daily. Patient taking differently: Take 50 mcg by mouth at bedtime.  04/06/12  Yes Darlin Coco, MD  Magnesium 250 MG TABS Take 1 tablet by mouth daily.    Yes Historical Provider, MD  meloxicam (MOBIC) 7.5 MG tablet Take 1 tablet (7.5 mg total) by mouth daily. 05/08/14  Yes Stefanie Libel, MD  ondansetron (ZOFRAN) 4 MG tablet Take 1 tablet (4 mg total) by mouth every 8 (eight) hours as needed for nausea or vomiting. 08/13/15  Yes Alexis Frock, MD  oxyCODONE-acetaminophen  (ROXICET) 5-325 MG per tablet Take 1-2 tablets by mouth every 4 (four) hours as needed for severe pain. After lithotripsy 08/13/15  Yes Alexis Frock, MD  rosuvastatin (CRESTOR) 10 MG tablet Take 5 mg by mouth at bedtime.    Yes Historical Provider, MD  tamsulosin (FLOMAX) 0.4 MG CAPS capsule Take 1 capsule (0.4 mg total) by mouth daily. To help pass kidney stone fragments 08/13/15  Yes Alexis Frock, MD  zolpidem (AMBIEN) 10 MG tablet Take 10 mg by mouth at bedtime as needed. For sleep.   Yes Historical Provider, MD  nitroGLYCERIN (NITRO-DUR) 0.2 mg/hr patch Use 1/4 patch to R knee once daily. Patient not taking: Reported on 11/28/2014 04/29/14   Timmothy Euler, MD    Family History Family History  Problem Relation Age of Onset  . Breast cancer Mother   . Dementia Father   . Colon cancer Neg Hx   . Stomach cancer Neg Hx     Social History Social History  Substance Use Topics  . Smoking status: Never Smoker  . Smokeless tobacco: Never Used  . Alcohol use No     Allergies   Codeine and Hydrocodone-acetaminophen   Review of Systems Review of Systems  All other systems reviewed and are negative.    Physical Exam Updated Vital Signs BP 120/56   Pulse 78   Temp 97.6 F (36.4 C) (Oral)   Resp 13   Ht 5\' 8"  (1.727 m)   Wt 30.4 kg   SpO2 96%   BMI 10.19 kg/m   Physical Exam  Constitutional: She appears well-developed and well-nourished. No distress.  HENT:  Head: Normocephalic and atraumatic.  Neck: Neck supple.  Cardiovascular: Normal rate and regular rhythm.   Pulmonary/Chest: Effort normal and breath sounds normal. No respiratory distress. She has no wheezes. She has no rales.  Abdominal: Soft. She exhibits no distension. There is no tenderness. There is no rebound and no guarding.  Neurological: She is alert.  CN II-XII intact, EOMs intact, no pronator drift, grip strengths equal bilaterally; strength 5/5 in all extremities, sensation intact in all extremities;  finger to nose is normal.   Pt does note some spinning with change in position.    Skin: She is not diaphoretic.  Nursing note and vitals reviewed.    ED Treatments / Results  Labs (all labs ordered are listed, but only abnormal results are displayed) Labs Reviewed  BASIC METABOLIC PANEL - Abnormal; Notable for the following:       Result Value   Glucose, Bld 101 (*)    All other components within normal limits  CBC - Abnormal; Notable for the following:  WBC 12.4 (*)    All other components within normal limits  URINALYSIS, ROUTINE W REFLEX MICROSCOPIC (NOT AT [facility]) - Abnormal; Notable for the following:    Hgb urine dipstick SMALL (*)    Leukocytes, UA SMALL (*)    All other components within normal limits  URINE MICROSCOPIC-ADD ON - Abnormal; Notable for the following:    Squamous Epithelial / LPF 0-5 (*)    Bacteria, UA RARE (*)    Crystals CA OXALATE CRYSTALS (*)    All other components within normal limits  URINE CULTURE  TROPONIN I  D-DIMER, QUANTITATIVE (NOT AT [facility])  BRAIN NATRIURETIC PEPTIDE  CBG MONITORING, ED  CBG MONITORING, ED    EKG  EKG Interpretation None       Radiology No results found.  Procedures Procedures (including critical care time)  Medications Ordered in ED Medications  0.9 %  sodium chloride infusion (not administered)  sodium chloride 0.9 % bolus 1,000 mL (1,000 mLs Intravenous New Bag/Given 08/22/16 2357)     Initial Impression / Assessment and Plan / ED Course  I have reviewed the triage vital signs and the nursing notes.  Pertinent labs & imaging results that were available during my care of the patient were reviewed by me and considered in my medical decision making (see chart for details).  Clinical Course  Value Comment By Time  RBC / HPF: 0-5 (Reviewed) Clayton Bibles, PA-C 09/11 2215  WBC: (!) 12.4 (Reviewed) Clayton Bibles, PA-C 09/11 2215    Afebrile, nontoxic patient with episodic lightheadedness with changing  position, standing up quickly.  Today she fell while walking down the sidewalk.  Has had constant atypical chest pain x several days.  Workup remarkable for orthostatic hypotension.  Troponin negative, d-dimer negative, BNP negative.  Pt also seen by Dr Ellender Hose.  Pt ambulatory without symptoms following IVF.   D/C home with recommendations for increased PO intake, close PCP follow up.  Discussed result, findings, treatment, and follow up  with patient.  Pt given return precautions.  Pt verbalizes understanding and agrees with plan.       Final Clinical Impressions(s) / ED Diagnoses   Final diagnoses:  Orthostatic hypotension    New Prescriptions New Prescriptions   No medications on file     Clayton Bibles, PA-C 08/23/16 0147    Duffy Bruce, MD 08/23/16 1344

## 2016-08-22 NOTE — ED Notes (Signed)
No troponin drawn and ordered in triage, unable to add on d/t age of blood. Notified triage RN/phlebotomist to redraw.

## 2016-08-22 NOTE — ED Triage Notes (Signed)
C/o falling States she hurt her right hip pain States she has been falling lately due to dizziness

## 2016-08-22 NOTE — ED Notes (Signed)
Pt was ortostatic at [facility]

## 2016-08-22 NOTE — ED Provider Notes (Signed)
CSN: [id number]     Arrival date & time 08/22/16  1431 History   First MD Initiated Contact with Patient 08/22/16 1559     Chief Complaint  Patient presents with  . Fall   (Consider location/radiation/quality/duration/timing/severity/associated sxs/prior Treatment) HPI [name] is a 70 y.o. female presenting to UC with husband with concern for further evaluation of dizziness with associated fall earlier today after a new exercise class.  Pt states she has had intermittent episodes of dizziness and falls over the last 4-6 weeks but has not f/u with a PCP for further evaluation.  Today, she fell onto hard ground and hit her Right hip but denies LOC or hitting her head.  Right hip pain is more in her Right buttock, aching and sore, mild at this time, states she does not believe she broke her hip, "just a bruise."  She does not feel dizzy at this time but states she called her PCP who recommended further evaluation at ED.  Denies hx of CAD. Denies weakness or numbness in arms or legs.  Denies hx of DM. Denies urinary symptoms but reports hx of kidney stones.    Past Medical History:  Diagnosis Date  . Complication of anesthesia    aspiration pna  . Constipation   . Cough   . Depression   . Elevated liver function tests   . Esophageal stricture   . Gallstones   . GERD (gastroesophageal reflux disease)   . Hiatal hernia   . Hyperlipemia   . Hypothyroidism   . Insomnia   . Renal cyst    Past Surgical History:  Procedure Laterality Date  . CARDIOVASCULAR STRESS TEST  07/19/2006   EF 70%, NO EVIDENCE OF ISCHEMIA  . CHOLECYSTECTOMY  12/30/10  . COLONOSCOPY    . DILATION AND CURETTAGE OF UTERUS    . inguinal herniography    . NASAL SINUS SURGERY    . T & A    . TOOTH EXTRACTION    . US ECHOCARDIOGRAPHY  07/17/2006   EF 55-60%   Family History  Problem Relation Age of Onset  . Breast cancer Mother   . Colon cancer Neg Hx   . Stomach cancer Neg Hx   . Dementia Father    Social  History  Substance Use Topics  . Smoking status: Never Smoker  . Smokeless tobacco: Never Used  . Alcohol use No   OB History    No data available     Review of Systems  Constitutional: Negative for appetite change and fever.  Respiratory: Negative for cough and shortness of breath.   Cardiovascular: Negative for chest pain and palpitations.  Gastrointestinal: Positive for abdominal pain ( epigastric). Negative for diarrhea, nausea and vomiting.  Genitourinary: Negative for dysuria, flank pain, frequency and urgency.  Neurological: Positive for dizziness, syncope ( near-syncope), weakness ( generalized) and light-headedness. Negative for seizures and headaches.    Allergies  Codeine and Hydrocodone-acetaminophen  Home Medications   Prior to Admission medications   Medication Sig Start Date End Date Taking? Authorizing Provider  aspirin EC 81 MG tablet Take 81 mg by mouth daily.    Historical Provider, MD  B Complex-C (B-COMPLEX WITH VITAMIN C) tablet Take 1 tablet by mouth daily.    Historical Provider, MD  Black Cohosh 540 MG CAPS Take 1 capsule by mouth daily.     Historical Provider, MD  budesonide (RHINOCORT AQUA) 32 MCG/ACT nasal spray Place 2 sprays into the nose daily. 1 spray  in each nostril.    Historical Provider, MD  Cholecalciferol (HM VITAMIN D3) 2000 UNITS CAPS Take 1 capsule by mouth daily.     Historical Provider, MD  escitalopram (LEXAPRO) 10 MG tablet Take 10 mg by mouth daily.      Historical Provider, MD  esomeprazole (NEXIUM) 40 MG capsule Take 40 mg by mouth 2 (two) times daily before a meal.    Historical Provider, MD  ezetimibe (ZETIA) 10 MG tablet Take 10 mg by mouth at bedtime.    Historical Provider, MD  ibuprofen (ADVIL,MOTRIN) 200 MG tablet Take 400 mg by mouth every 6 (six) hours as needed for moderate pain.    Historical Provider, MD  KRILL OIL PO Take 1 capsule by mouth daily.    Historical Provider, MD  levothyroxine (SYNTHROID, LEVOTHROID) 50 MCG  tablet Take 1 tablet (50 mcg total) by mouth daily. Patient taking differently: Take 50 mcg by mouth at bedtime.  04/06/12   Darlin Coco, MD  Magnesium 250 MG TABS Take 1 tablet by mouth daily.     Historical Provider, MD  meloxicam (MOBIC) 7.5 MG tablet Take 1 tablet (7.5 mg total) by mouth daily. 05/08/14   Stefanie Libel, MD  nitroGLYCERIN (NITRO-DUR) 0.2 mg/hr patch Use 1/4 patch to R knee once daily. Patient not taking: Reported on 11/28/2014 04/29/14   Timmothy Euler, MD  ondansetron (ZOFRAN) 4 MG tablet Take 1 tablet (4 mg total) by mouth every 8 (eight) hours as needed for nausea or vomiting. 08/13/15   Alexis Frock, MD  oxyCODONE-acetaminophen (ROXICET) 5-325 MG per tablet Take 1-2 tablets by mouth every 4 (four) hours as needed for severe pain. After lithotripsy 08/13/15   Alexis Frock, MD  Red Yeast Rice Extract (RED YEAST RICE PO) Take 1 capsule by mouth daily.     Historical Provider, MD  rosuvastatin (CRESTOR) 10 MG tablet Take 5 mg by mouth at bedtime.     Historical Provider, MD  tamsulosin (FLOMAX) 0.4 MG CAPS capsule Take 1 capsule (0.4 mg total) by mouth daily. To help pass kidney stone fragments 08/13/15   Alexis Frock, MD  zolpidem (AMBIEN) 10 MG tablet Take 10 mg by mouth at bedtime as needed. For sleep.    Historical Provider, MD   Meds Ordered and Administered this Visit  Medications - No data to display  BP (!) 127/52 (BP Location: Left Arm)   Pulse 87   Temp 98.4 F (36.9 C) (Oral)   Resp 12   SpO2 100%  Orthostatic VS for the past 24 hrs:  BP- Lying Pulse- Lying BP- Sitting Pulse- Sitting BP- Standing at 0 minutes Pulse- Standing at 0 minutes  08/22/16 1637 123/80 70 149/89 77 (!) 87/47 88    Physical Exam  Constitutional: She is oriented to person, place, and time. She appears well-developed and well-nourished. No distress.  HENT:  Head: Normocephalic and atraumatic.  Right Ear: Tympanic membrane normal.  Left Ear: Tympanic membrane normal.  Nose: Nose  normal.  Mouth/Throat: Uvula is midline, oropharynx is clear and moist and mucous membranes are normal.  Eyes: Conjunctivae and EOM are normal. Pupils are equal, round, and reactive to light. No scleral icterus.  Neck: Normal range of motion.  Cardiovascular: Normal rate, regular rhythm and normal heart sounds.   Pulmonary/Chest: Effort normal and breath sounds normal. No respiratory distress. She has no wheezes. She has no rales.  Abdominal: Soft. Bowel sounds are normal. She exhibits no distension and no mass. There is no tenderness.  There is no rebound and no guarding.  Musculoskeletal: Normal range of motion. She exhibits tenderness. She exhibits no edema.  No midline spinal tenderness. No tenderness or deformity of hip joint. Tenderness to Right buttock.  Neurological: She is alert and oriented to person, place, and time.  CN II-XII in tact, speech is clear, alert to person, place and time. Able to follow 2 step commands. Normal gait.  Skin: Skin is warm and dry. She is not diaphoretic.  Nursing note and vitals reviewed.   Urgent Care Course   Clinical Course    Procedures (including critical care time)  Labs Review Labs Reviewed  POCT URINALYSIS DIP (DEVICE) - Abnormal; Notable for the following:       Result Value   Hgb urine dipstick SMALL (*)    Leukocytes, UA TRACE (*)    All other components within normal limits  POCT I-STAT, CHEM 8 - Abnormal; Notable for the following:    Glucose, Bld 100 (*)    All other components within normal limits    Imaging Review No results found.  Date/Time:08/22/2016   1626:45 Ventricular Rate: 78 PR Interval: 176 QRS Duration: 84 QT Interval: 398 QTC Calculation: 453 P-R-T axes: 66  -74  16 Text Interpretation: Normal sinus rhythm, Low voltage QRS, Left anterior fascicular block, inferior infarct- age undetermined, anterolateral infarct- age undetermined. Abnormal EKG  Prior EKG from 11/30/14 does show Incomplete RBBB and  LAFB, low voltage precordial leads, RSR' in V1 or V2, right VCD or RVH, consider anterior infarct, baseline wander in leads II and aVR  MDM   1. Dizziness   2. Abnormal EKG   3. Fall, initial encounter   4. Orthostatic hypotension   5. Right buttock pain     Orthostatic VS for the past 24 hrs:  BP- Lying Pulse- Lying BP- Sitting Pulse- Sitting BP- Standing at 0 minutes Pulse- Standing at 0 minutes  08/22/16 1637 123/80 70 149/89 77 (!) 87/47 88   Pt c/o 4-6 weeks of intermittent episodes of dizziness and increased falls.  Reports falling while walking today after an exercise class.  Vitals in triage: unremarkable. Orthostatic vitals show orthostatic hypotension, likely cause of pt's reported dizziness and falls. Denies hitting her head during falls. Denies headache.  Normal neuro exam. EKG: signs of prior heart disease appears c/w prior EKG in 2015   Due to symptomatic orthostatic hypotension and multiple near syncope episodes with fall today, will send to ED for further evaluation. Pt accompanied by husband.     Noland Fordyce, PA-C 08/22/16 1707

## 2016-08-22 NOTE — ED Triage Notes (Signed)
Pt reports having dizziness in the last month worsening today. Pt also sts  Chest pain but says "forget about the chest pain, the dizziness is the problem.: Pt sts today she got so dizzy that she fell. Pt reports mild pain to hip, ambulatory since the fall. Pt denies LOC. Pt was seen at [facility] and told she has had MI in the past. Neurologically intact.

## 2016-08-22 NOTE — ED Notes (Signed)
Pt up at desk asking for update, apologized for wait and reassured that patient would be sent to next available room.

## 2016-08-23 ENCOUNTER — Emergency Department (HOSPITAL_COMMUNITY): Payer: Medicare Other

## 2016-08-23 DIAGNOSIS — Z23 Encounter for immunization: Secondary | ICD-10-CM | POA: Diagnosis not present

## 2016-08-23 DIAGNOSIS — R079 Chest pain, unspecified: Secondary | ICD-10-CM | POA: Diagnosis not present

## 2016-08-23 DIAGNOSIS — G4700 Insomnia, unspecified: Secondary | ICD-10-CM | POA: Diagnosis not present

## 2016-08-23 DIAGNOSIS — Z6826 Body mass index (BMI) 26.0-26.9, adult: Secondary | ICD-10-CM | POA: Diagnosis not present

## 2016-08-23 DIAGNOSIS — N951 Menopausal and female climacteric states: Secondary | ICD-10-CM | POA: Diagnosis not present

## 2016-08-23 DIAGNOSIS — I951 Orthostatic hypotension: Secondary | ICD-10-CM | POA: Diagnosis not present

## 2016-08-23 DIAGNOSIS — R42 Dizziness and giddiness: Secondary | ICD-10-CM | POA: Diagnosis not present

## 2016-08-23 LAB — BRAIN NATRIURETIC PEPTIDE: B NATRIURETIC PEPTIDE 5: 13.2 pg/mL (ref 0.0–100.0)

## 2016-08-23 MED ORDER — SODIUM CHLORIDE 0.9 % IV SOLN: Freq: Once | INTRAVENOUS | Status: DC

## 2016-08-23 NOTE — Discharge Instructions (Signed)
Read the information below.  You may return to the Emergency Department at any time for worsening condition or any new symptoms that concern you.   If you develop worsening chest pain, shortness of breath, fever, you pass out, or become weak or dizzy, return to the ER for a recheck.   Drink plenty of fluids throughout the day.

## 2016-08-24 LAB — URINE CULTURE

## 2016-08-25 ENCOUNTER — Telehealth (HOSPITAL_COMMUNITY): Payer: Self-pay

## 2016-08-25 NOTE — Telephone Encounter (Signed)
Post ED Visit - Positive Culture Follow-up: Successful Patient Follow-Up  Culture assessed and recommendations reviewed by: []  Elenor Quinones, Pharm.D. []  Heide Guile, Pharm.D., BCPS []  Parks Neptune, Pharm.D. []  Alycia Rossetti, Pharm.D., BCPS []  [place], Pharm.D., BCPS, AAHIVP []  Legrand Como, Pharm.D., BCPS, AAHIVP []  Cassie Nicole Kindred, Pharm.D. []  Stephens November, [place].D. Shanon Rosser, Pharm.D.  Positive urine culture, 40,000 colonies -> Group B Strep  No treatment   Dortha Kern 08/25/2016, 5:31 PM

## 2016-09-09 ENCOUNTER — Telehealth: Payer: Self-pay | Admitting: Cardiology

## 2016-09-09 NOTE — Telephone Encounter (Signed)
Records received from [facility] for apt on 10/25/16 with Dr Stanford Breed. Records given to [employer] (medical records) CN

## 2016-09-13 DIAGNOSIS — Z6826 Body mass index (BMI) 26.0-26.9, adult: Secondary | ICD-10-CM | POA: Diagnosis not present

## 2016-09-13 DIAGNOSIS — E038 Other specified hypothyroidism: Secondary | ICD-10-CM | POA: Diagnosis not present

## 2016-09-13 DIAGNOSIS — M1990 Unspecified osteoarthritis, unspecified site: Secondary | ICD-10-CM | POA: Diagnosis not present

## 2016-09-13 DIAGNOSIS — F325 Major depressive disorder, single episode, in full remission: Secondary | ICD-10-CM | POA: Diagnosis not present

## 2016-09-13 DIAGNOSIS — E663 Overweight: Secondary | ICD-10-CM | POA: Diagnosis not present

## 2016-09-13 DIAGNOSIS — R7309 Other abnormal glucose: Secondary | ICD-10-CM | POA: Diagnosis not present

## 2016-09-13 DIAGNOSIS — K219 Gastro-esophageal reflux disease without esophagitis: Secondary | ICD-10-CM | POA: Diagnosis not present

## 2016-09-13 DIAGNOSIS — R42 Dizziness and giddiness: Secondary | ICD-10-CM | POA: Diagnosis not present

## 2016-10-11 ENCOUNTER — Encounter: Payer: Self-pay | Admitting: Cardiology

## 2016-10-20 NOTE — Progress Notes (Signed)
HPI: 70 year old female for evaluation of dizziness. Echocardiogram 2007 showed normal LV systolic function. Nuclear study December 2015 showed normal LV systolic function with ejection fraction 64% and normal perfusion. Patient has been seen in the emergency room recently and felt to have orthostatic hypotension. Chest x-ray, troponin, d-dimer, hemoglobin all unremarkable. Renal function normal. On the day the patient was seen she had just completed an exercise program. She developed dizziness when she stood up that resolved when she sat down. She then stood to walk to her car and became so dizzy she fell. She did not have syncope. In urgent care she apparently was orthostatic and improved with IV hydration. She has dyspnea with more extreme activities but not routine activities. No orthopnea, PND, pedal edema, exertional chest pain or history of syncope.  Current Outpatient Prescriptions  Medication Sig Dispense Refill  . aspirin EC 81 MG tablet Take 81 mg by mouth daily.    . B Complex-C (B-COMPLEX WITH VITAMIN C) tablet Take 1 tablet by mouth daily.    . Cholecalciferol (HM VITAMIN D3) 2000 UNITS CAPS Take 1 capsule by mouth daily.     . cycloSPORINE (RESTASIS) 0.05 % ophthalmic emulsion Place 1 drop into both eyes daily as needed. For dry eyes    . escitalopram (LEXAPRO) 10 MG tablet Take 10 mg by mouth daily.      Marland Kitchen esomeprazole (NEXIUM) 40 MG capsule Take 40 mg by mouth 2 (two) times daily before a meal.    . ezetimibe (ZETIA) 10 MG tablet Take 10 mg by mouth at bedtime.    Marland Kitchen ibuprofen (ADVIL,MOTRIN) 200 MG tablet Take 400 mg by mouth every 6 (six) hours as needed for moderate pain.    Marland Kitchen KRILL OIL PO Take 1 capsule by mouth daily.    Marland Kitchen levothyroxine (SYNTHROID, LEVOTHROID) 50 MCG tablet Take 1 tablet (50 mcg total) by mouth daily. (Patient taking differently: Take 50 mcg by mouth at bedtime. ) 90 tablet 3  . Magnesium 250 MG TABS Take 1 tablet by mouth daily.     . meloxicam (MOBIC) 7.5  MG tablet Take 1 tablet (7.5 mg total) by mouth daily. 30 tablet 1  . nitroGLYCERIN (NITRO-DUR) 0.2 mg/hr patch Use 1/4 patch to R knee once daily. 30 patch 2  . rosuvastatin (CRESTOR) 10 MG tablet Take 5 mg by mouth at bedtime.     Marland Kitchen zolpidem (AMBIEN) 10 MG tablet Take 10 mg by mouth at bedtime as needed. For sleep.     No current facility-administered medications for this visit.     Allergies  Allergen Reactions  . Codeine Nausea And Vomiting  . Hydrocodone-Acetaminophen Nausea And Vomiting     Past Medical History:  Diagnosis Date  . Complication of anesthesia    aspiration pna  . Constipation   . Cough   . Depression   . Elevated liver function tests   . Esophageal stricture   . Gallstones   . GERD (gastroesophageal reflux disease)   . Hiatal hernia   . Hyperlipemia   . Hypothyroidism   . Insomnia   . Nephrolithiasis   . Renal cyst     Past Surgical History:  Procedure Laterality Date  . CARDIOVASCULAR STRESS TEST  07/19/2006   EF 70%, NO EVIDENCE OF ISCHEMIA  . CHOLECYSTECTOMY  12/30/10  . COLONOSCOPY    . DILATION AND CURETTAGE OF UTERUS    . inguinal herniography    . NASAL SINUS SURGERY    .  T & A    . TOOTH EXTRACTION    . US ECHOCARDIOGRAPHY  07/17/2006   EF 55-60%    Social History   Social History  . Marital status: Married    Spouse name: N/A  . Number of children: 2  . Years of education: N/A   Occupational History  . Retired Agricultural engineer   Social History Main Topics  . Smoking status: Never Smoker  . Smokeless tobacco: Never Used  . Alcohol use No  . Drug use: No  . Sexual activity: Not on file   Other Topics Concern  . Not on file   Social History Narrative  . No narrative on file    Family History  Problem Relation Age of Onset  . Breast cancer Mother   . Dementia Father   . Colon cancer Neg Hx   . Stomach cancer Neg Hx     ROS: no fevers or chills, productive cough, hemoptysis, dysphasia, odynophagia, melena, hematochezia,  dysuria, hematuria, rash, seizure activity, orthopnea, PND, pedal edema, claudication. Remaining systems are negative.  Physical Exam:   Blood pressure 110/80, pulse 80, height 5\' 8"  (1.727 m), weight 175 lb (79.4 kg).  General:  Well developed/well nourished in NAD Skin warm/dry Patient not depressed No peripheral clubbing Back-normal HEENT-normal/normal eyelids Neck supple/normal carotid upstroke bilaterally; no bruits; no JVD; no thyromegaly chest - CTA/ normal expansion CV - RRR/normal S1 and S2; no murmurs, rubs or gallops;  PMI nondisplaced Abdomen -NT/ND, no HSM, no mass, + bowel sounds, no bruit 2+ femoral pulses, no bruits Ext-no edema, chords, 2+ DP Neuro-grossly nonfocal  ECG - 08/22/2016-sinus rhythm, left anterior fascicular block, conduction delay, cannot rule out prior lateral infarct.  Electrocardiogram today shows sinus rhythm, left axis deviation, RV conduction delay, poor R-wave progression.  A/P  1 dizziness-symptoms are consistent with orthostasis. She has had no recurrences. I encouraged her to maintain fluid and sodium intake.  2 abnormal ECG-electrocardiogram shows poor progression and prior infarct cannot be excluded. I will arrange an echocardiogram to assess wall motion. If normal we will not pursue further cardiac evaluation.  3 hyperlipidemia-continue statin.  Kirk Ruths, MD

## 2016-10-25 ENCOUNTER — Ambulatory Visit (INDEPENDENT_AMBULATORY_CARE_PROVIDER_SITE_OTHER): Payer: Medicare Other | Admitting: Cardiology

## 2016-10-25 ENCOUNTER — Encounter: Payer: Self-pay | Admitting: Cardiology

## 2016-10-25 VITALS — BP 110/80 | HR 80 | Ht 68.0 in | Wt 175.0 lb

## 2016-10-25 DIAGNOSIS — R42 Dizziness and giddiness: Secondary | ICD-10-CM | POA: Diagnosis not present

## 2016-10-25 DIAGNOSIS — R9431 Abnormal electrocardiogram [ECG] [EKG]: Secondary | ICD-10-CM | POA: Diagnosis not present

## 2016-10-25 NOTE — Patient Instructions (Signed)
Medication Instructions:  Continue current medications  Labwork: None Ordered  Testing/Procedures: Your physician has requested that you have an echocardiogram. Echocardiography is a painless test that uses sound waves to create images of your heart. It provides your doctor with information about the size and shape of your heart and how well your heart's chambers and valves are working. This procedure takes approximately one hour. There are no restrictions for this procedure.  Follow-Up: Your physician recommends that you schedule a follow-up appointment in: As Needed   Any Other Special Instructions Will Be Listed Below (If Applicable).         Happy Thanksgiving   If you need a refill on your cardiac medications before your next appointment, please call your pharmacy.

## 2016-11-16 DIAGNOSIS — G5603 Carpal tunnel syndrome, bilateral upper limbs: Secondary | ICD-10-CM | POA: Diagnosis not present

## 2016-11-16 DIAGNOSIS — M79644 Pain in right finger(s): Secondary | ICD-10-CM | POA: Diagnosis not present

## 2016-11-16 DIAGNOSIS — M180 Bilateral primary osteoarthritis of first carpometacarpal joints: Secondary | ICD-10-CM | POA: Diagnosis not present

## 2016-11-16 DIAGNOSIS — M19042 Primary osteoarthritis, left hand: Secondary | ICD-10-CM | POA: Diagnosis not present

## 2016-11-16 DIAGNOSIS — M79645 Pain in left finger(s): Secondary | ICD-10-CM | POA: Diagnosis not present

## 2016-11-17 ENCOUNTER — Other Ambulatory Visit: Payer: Self-pay

## 2016-11-17 ENCOUNTER — Ambulatory Visit (HOSPITAL_COMMUNITY): Payer: Medicare Other | Attending: Cardiology

## 2016-11-17 DIAGNOSIS — R9431 Abnormal electrocardiogram [ECG] [EKG]: Secondary | ICD-10-CM | POA: Diagnosis not present

## 2016-11-17 DIAGNOSIS — E785 Hyperlipidemia, unspecified: Secondary | ICD-10-CM | POA: Insufficient documentation

## 2016-11-17 DIAGNOSIS — I071 Rheumatic tricuspid insufficiency: Secondary | ICD-10-CM | POA: Insufficient documentation

## 2016-11-18 DIAGNOSIS — M79643 Pain in unspecified hand: Secondary | ICD-10-CM | POA: Diagnosis not present

## 2016-11-18 DIAGNOSIS — M170 Bilateral primary osteoarthritis of knee: Secondary | ICD-10-CM | POA: Diagnosis not present

## 2016-11-18 DIAGNOSIS — M1712 Unilateral primary osteoarthritis, left knee: Secondary | ICD-10-CM | POA: Diagnosis not present

## 2016-11-18 DIAGNOSIS — M1711 Unilateral primary osteoarthritis, right knee: Secondary | ICD-10-CM | POA: Diagnosis not present

## 2016-11-21 DIAGNOSIS — L570 Actinic keratosis: Secondary | ICD-10-CM | POA: Diagnosis not present

## 2016-11-21 DIAGNOSIS — L821 Other seborrheic keratosis: Secondary | ICD-10-CM | POA: Diagnosis not present

## 2016-11-22 DIAGNOSIS — G5603 Carpal tunnel syndrome, bilateral upper limbs: Secondary | ICD-10-CM | POA: Diagnosis not present

## 2016-11-23 DIAGNOSIS — M19042 Primary osteoarthritis, left hand: Secondary | ICD-10-CM | POA: Diagnosis not present

## 2016-11-23 DIAGNOSIS — G5603 Carpal tunnel syndrome, bilateral upper limbs: Secondary | ICD-10-CM | POA: Diagnosis not present

## 2016-11-23 DIAGNOSIS — M180 Bilateral primary osteoarthritis of first carpometacarpal joints: Secondary | ICD-10-CM | POA: Diagnosis not present

## 2017-03-07 DIAGNOSIS — R921 Mammographic calcification found on diagnostic imaging of breast: Secondary | ICD-10-CM | POA: Diagnosis not present

## 2017-03-08 DIAGNOSIS — R7309 Other abnormal glucose: Secondary | ICD-10-CM | POA: Diagnosis not present

## 2017-03-08 DIAGNOSIS — N390 Urinary tract infection, site not specified: Secondary | ICD-10-CM | POA: Diagnosis not present

## 2017-03-08 DIAGNOSIS — E038 Other specified hypothyroidism: Secondary | ICD-10-CM | POA: Diagnosis not present

## 2017-03-08 DIAGNOSIS — E784 Other hyperlipidemia: Secondary | ICD-10-CM | POA: Diagnosis not present

## 2017-03-08 DIAGNOSIS — R8299 Other abnormal findings in urine: Secondary | ICD-10-CM | POA: Diagnosis not present

## 2017-03-09 DIAGNOSIS — M1711 Unilateral primary osteoarthritis, right knee: Secondary | ICD-10-CM | POA: Diagnosis not present

## 2017-03-09 DIAGNOSIS — M1712 Unilateral primary osteoarthritis, left knee: Secondary | ICD-10-CM | POA: Diagnosis not present

## 2017-03-09 DIAGNOSIS — M170 Bilateral primary osteoarthritis of knee: Secondary | ICD-10-CM | POA: Diagnosis not present

## 2017-03-20 DIAGNOSIS — Z1212 Encounter for screening for malignant neoplasm of rectum: Secondary | ICD-10-CM | POA: Diagnosis not present

## 2017-03-23 DIAGNOSIS — R430 Anosmia: Secondary | ICD-10-CM | POA: Diagnosis not present

## 2017-03-23 DIAGNOSIS — Z6828 Body mass index (BMI) 28.0-28.9, adult: Secondary | ICD-10-CM | POA: Diagnosis not present

## 2017-03-23 DIAGNOSIS — Z1389 Encounter for screening for other disorder: Secondary | ICD-10-CM | POA: Diagnosis not present

## 2017-03-23 DIAGNOSIS — Z0000 Encounter for general adult medical examination without abnormal findings: Secondary | ICD-10-CM | POA: Diagnosis not present

## 2017-03-23 DIAGNOSIS — N200 Calculus of kidney: Secondary | ICD-10-CM | POA: Diagnosis not present

## 2017-03-23 DIAGNOSIS — M1990 Unspecified osteoarthritis, unspecified site: Secondary | ICD-10-CM | POA: Diagnosis not present

## 2017-03-23 DIAGNOSIS — R0601 Orthopnea: Secondary | ICD-10-CM | POA: Diagnosis not present

## 2017-03-23 DIAGNOSIS — F325 Major depressive disorder, single episode, in full remission: Secondary | ICD-10-CM | POA: Diagnosis not present

## 2017-03-23 DIAGNOSIS — N951 Menopausal and female climacteric states: Secondary | ICD-10-CM | POA: Diagnosis not present

## 2017-03-23 DIAGNOSIS — I700 Atherosclerosis of aorta: Secondary | ICD-10-CM | POA: Diagnosis not present

## 2017-03-23 DIAGNOSIS — B029 Zoster without complications: Secondary | ICD-10-CM | POA: Diagnosis not present

## 2017-03-23 DIAGNOSIS — R42 Dizziness and giddiness: Secondary | ICD-10-CM | POA: Diagnosis not present

## 2017-04-20 DIAGNOSIS — L814 Other melanin hyperpigmentation: Secondary | ICD-10-CM | POA: Diagnosis not present

## 2017-04-20 DIAGNOSIS — D225 Melanocytic nevi of trunk: Secondary | ICD-10-CM | POA: Diagnosis not present

## 2017-04-20 DIAGNOSIS — L821 Other seborrheic keratosis: Secondary | ICD-10-CM | POA: Diagnosis not present

## 2017-04-20 DIAGNOSIS — L728 Other follicular cysts of the skin and subcutaneous tissue: Secondary | ICD-10-CM | POA: Diagnosis not present

## 2017-05-15 DIAGNOSIS — R430 Anosmia: Secondary | ICD-10-CM | POA: Diagnosis not present

## 2017-05-15 DIAGNOSIS — H9193 Unspecified hearing loss, bilateral: Secondary | ICD-10-CM | POA: Diagnosis not present

## 2017-05-15 DIAGNOSIS — R0981 Nasal congestion: Secondary | ICD-10-CM | POA: Diagnosis not present

## 2017-05-15 DIAGNOSIS — H9313 Tinnitus, bilateral: Secondary | ICD-10-CM | POA: Diagnosis not present

## 2017-05-25 ENCOUNTER — Telehealth: Payer: Self-pay | Admitting: Cardiology

## 2017-05-25 DIAGNOSIS — H3509 Other intraretinal microvascular abnormalities: Secondary | ICD-10-CM | POA: Diagnosis not present

## 2017-05-25 DIAGNOSIS — H5213 Myopia, bilateral: Secondary | ICD-10-CM | POA: Diagnosis not present

## 2017-05-25 DIAGNOSIS — H52223 Regular astigmatism, bilateral: Secondary | ICD-10-CM | POA: Diagnosis not present

## 2017-05-25 DIAGNOSIS — H25813 Combined forms of age-related cataract, bilateral: Secondary | ICD-10-CM | POA: Diagnosis not present

## 2017-05-25 NOTE — Telephone Encounter (Signed)
Pt with h/o orthostatic hypotension; liberalize NA and encourage increased po fluid intake; compression hose;  if symptoms persist despite these measures, paov and may need midodrine. Kirk Ruths

## 2017-05-25 NOTE — Telephone Encounter (Signed)
Spoke with pt/husband they state that pt went to the eye doctor Marica Otter) she states that pt is "having a vascular problem" and also pt is bleeding in her right eye. Also, husband states that she told him that pt's BP is low @ 101/60. Husband states that yesterday pt's BP was running 125/75 and 115/85 HR is running 85. Dr Sabra Heck will be sending you her note from yesterday. Husband states that Pt is going to f/u in 5 months with Dr Sabra Heck. Please advise with any changes

## 2017-05-25 NOTE — Telephone Encounter (Signed)
Pt/husband notified he will get compression stockings and call back on Monday with results.

## 2017-05-25 NOTE — Telephone Encounter (Signed)
Pt c/o BP issue: STAT if pt c/o blurred vision, one-sided weakness or slurred speech  1. What are your last 5 BP readings? 101/60 (today)   2. Are you having any other symptoms (ex. Dizziness, headache, blurred vision, passed out)? Constant Dizziness when she stands up  3. What is your BP issue? Low blood pressure   Patient is bleeding in her right eye and was told by her eye doctor to follow up

## 2017-05-31 ENCOUNTER — Telehealth: Payer: Self-pay | Admitting: Cardiology

## 2017-05-31 NOTE — Telephone Encounter (Signed)
Continue increased po fluid intake and Na intake; we can add midodrine later if needed [name]

## 2017-05-31 NOTE — Telephone Encounter (Signed)
New message   6-15  Pm  112/71   77  6/16 am  122/74    72    Standing   97/82    103  6-16  Pm   124/72    72    Standing  118/79     86  6-17  Am   132/89     73    Standing   106/80      102  6-17   Pm    128/76   72         6-18  117/81    78       Pm      124/90     106  6-20    141/86     74     Standing    139/74     109

## 2017-05-31 NOTE — Telephone Encounter (Signed)
Spoke with pt husband, he reports the patient is very fatigued and just sits around. She continues to have dizziness when standing from sitting. She does report the increase in water has helped her dizziness. He would like these bp readings be forwarded to dr Stanford Breed. Will forward for dr Stanford Breed review

## 2017-06-01 ENCOUNTER — Telehealth: Payer: Self-pay | Admitting: Cardiology

## 2017-06-01 NOTE — Telephone Encounter (Signed)
Left message of dr crenshaw's recommendations. 

## 2017-06-01 NOTE — Telephone Encounter (Signed)
Left message for pt husband to call  

## 2017-06-01 NOTE — Telephone Encounter (Signed)
New message       Calling to talk to the nurse again.  Please call

## 2017-06-06 ENCOUNTER — Encounter: Payer: Self-pay | Admitting: Nurse Practitioner

## 2017-06-06 ENCOUNTER — Encounter (INDEPENDENT_AMBULATORY_CARE_PROVIDER_SITE_OTHER): Payer: Self-pay

## 2017-06-06 ENCOUNTER — Ambulatory Visit (INDEPENDENT_AMBULATORY_CARE_PROVIDER_SITE_OTHER): Payer: Medicare Other | Admitting: Nurse Practitioner

## 2017-06-06 VITALS — BP 134/66 | HR 87 | Ht 68.0 in | Wt 178.4 lb

## 2017-06-06 DIAGNOSIS — R0789 Other chest pain: Secondary | ICD-10-CM

## 2017-06-06 DIAGNOSIS — E7800 Pure hypercholesterolemia, unspecified: Secondary | ICD-10-CM

## 2017-06-06 DIAGNOSIS — R938 Abnormal findings on diagnostic imaging of other specified body structures: Secondary | ICD-10-CM

## 2017-06-06 DIAGNOSIS — R5383 Other fatigue: Secondary | ICD-10-CM

## 2017-06-06 DIAGNOSIS — H579 Unspecified disorder of eye and adnexa: Secondary | ICD-10-CM

## 2017-06-06 NOTE — Patient Instructions (Addendum)
We will be checking the following labs today - BMET, CBC, HPF, Mag level and TSH   Medication Instructions:    Continue with your current medicines.     Testing/Procedures To Be Arranged:  Lexiscan Myoview  Carotid doppler  Follow-Up:   See Dr. Stanford Breed back in follow up for discussion.     Other Special Instructions:   Liberalize your salt - have a V8 every morning  Knee high support stockings  Abdominal binder  More regular activity    If you need a refill on your cardiac medications before your next appointment, please call your pharmacy.   Call the [place] office at [phone] if you have any questions, problems or concerns.

## 2017-06-06 NOTE — Telephone Encounter (Signed)
Pt's husband needs this call back they will not be leaving the home until they get this call please call this Cell # [phone].   He would really appreciate it.   He said thanks.

## 2017-06-06 NOTE — Telephone Encounter (Signed)
Follow up   Pt husband verbalized that he is wanting to speak to rn called 3 times no answer  Pt husband insisted to speak to someone else

## 2017-06-06 NOTE — Telephone Encounter (Signed)
Spoke with pt husband, he reports the patient continues to feel bad. The increased fluid intake has helped with her dizziness but her fatigue level has gotten to the place she can not function. He is also concerned because the patient had a bursted blood vessel in her eye and the eye doctor was concerned she may have vascular disease. The husband reports bp 129/79 lying and 94/77 when standing and her pulse will elevate to 108. He would like the patient to be seen today. She will see lori gerhardt np today at 1:30 pm. Appointment location and time discussed.

## 2017-06-06 NOTE — Progress Notes (Signed)
CARDIOLOGY OFFICE NOTE  Date:  06/06/2017    [name] Date of Birth: 15-Dec-1945  Medical Record #[id number]  PCP:  Shon Baton, MD  Cardiologist:  Stanford Breed    Chief Complaint  Patient presents with  . Dizziness    Work in visit - seen for Dr. Stanford Breed    History of Present Illness: [name] is a 71 y.o. female who presents today for a work in visit. Seen for Dr. Stanford Breed.   She was last seen in December - had been referred for orthostatic hypotension. Normal Myoview from 2015. Echo ok from December of 2017.   Seen by Dr. Stanford Breed back in December - had had a prior ER visit - orthostatic and was treated for IV fluids.   Phone call dated last week - "Spoke with pt husband, he reports the patient continues to feel bad. The increased fluid intake has helped with her dizziness but her fatigue level has gotten to the place she can not function. He is also concerned because the patient had a bursted blood vessel in her eye and the eye doctor was concerned she may have vascular disease. The husband reports bp 129/79 lying and 94/77 when standing and her pulse will elevate to 108. He would like the patient to be seen today. She will see Crystall Donaldson np today at 1:30 pm. Appointment location and time discussed."  Thus added to my schedule for today.  Comes in today. Here with her husband. Lots of issues. She has been quite fatigued - somewhat chronic - but more so over the past several weeks. She is short of breath with exertion. She does not really exert - she is limited by her knees. She admits she is depressed. She is still dizzy with position changes. She has intermittent chest pain that she attributes to a hiatal hernia - not with exertion - does not exert. No regular housework. She has DOE. She does not use support stockings. She does use some salt and they do eat out some. She is overweight. She is quite tearful here today. She has had a recent eye exam - apparently told  she had some "vascular disorder" and a note was being sent to her PCP and to Dr. Stanford Breed - we do not have this documentation.   Past Medical History:  Diagnosis Date  . Complication of anesthesia    aspiration pna  . Constipation   . Cough   . Depression   . Elevated liver function tests   . Esophageal stricture   . Gallstones   . GERD (gastroesophageal reflux disease)   . Hiatal hernia   . Hyperlipemia   . Hypothyroidism   . Insomnia   . Nephrolithiasis   . Renal cyst     Past Surgical History:  Procedure Laterality Date  . CARDIOVASCULAR STRESS TEST  07/19/2006   EF 70%, NO EVIDENCE OF ISCHEMIA  . CHOLECYSTECTOMY  12/30/10  . COLONOSCOPY    . DILATION AND CURETTAGE OF UTERUS    . inguinal herniography    . NASAL SINUS SURGERY    . T & A    . TOOTH EXTRACTION    . US ECHOCARDIOGRAPHY  07/17/2006   EF 55-60%     Medications: Current Meds  Medication Sig  . aspirin EC 81 MG tablet Take 81 mg by mouth daily.  . B Complex-C (B-COMPLEX WITH VITAMIN C) tablet Take 1 tablet by mouth daily.  . Cholecalciferol (HM VITAMIN D3)  2000 UNITS CAPS Take 1 capsule by mouth daily.   . cycloSPORINE (RESTASIS) 0.05 % ophthalmic emulsion Place 1 drop into both eyes daily as needed. For dry eyes  . escitalopram (LEXAPRO) 10 MG tablet Take 10 mg by mouth daily.    Marland Kitchen esomeprazole (NEXIUM) 40 MG capsule Take 40 mg by mouth 2 (two) times daily before a meal.  . ezetimibe (ZETIA) 10 MG tablet Take 10 mg by mouth at bedtime.  Marland Kitchen ibuprofen (ADVIL,MOTRIN) 200 MG tablet Take 400 mg by mouth every 6 (six) hours as needed for moderate pain.  Marland Kitchen KRILL OIL PO Take 1 capsule by mouth daily.  Marland Kitchen levothyroxine (SYNTHROID, LEVOTHROID) 50 MCG tablet Take 50 mcg by mouth at bedtime.  . Magnesium 250 MG TABS Take 1 tablet by mouth daily.   . rosuvastatin (CRESTOR) 10 MG tablet Take 5 mg by mouth at bedtime.   Marland Kitchen zolpidem (AMBIEN) 10 MG tablet Take 10 mg by mouth at bedtime as needed. For sleep.  . [DISCONTINUED]  levothyroxine (SYNTHROID, LEVOTHROID) 50 MCG tablet Take 1 tablet (50 mcg total) by mouth daily. (Patient taking differently: Take 50 mcg by mouth at bedtime. )  . [DISCONTINUED] meloxicam (MOBIC) 7.5 MG tablet Take 1 tablet (7.5 mg total) by mouth daily.  . [DISCONTINUED] nitroGLYCERIN (NITRO-DUR) 0.2 mg/hr patch Use 1/4 patch to R knee once daily.     Allergies: Allergies  Allergen Reactions  . Codeine Nausea And Vomiting  . Hydrocodone-Acetaminophen Nausea And Vomiting    Social History: The patient  reports that she has never smoked. She has never used smokeless tobacco. She reports that she does not drink alcohol or use drugs.   Family History: The patient's family history includes Breast cancer in her mother; Dementia in her father.   Review of Systems: Please see the history of present illness.   Otherwise, the review of systems is positive for none.   All other systems are reviewed and negative.   Physical Exam: VS:  BP 134/66 (BP Location: Left Arm, Cuff Size: Large)   Pulse 87   Ht 5\' 8"  (1.727 m)   Wt 178 lb 6.4 oz (80.9 kg)   BMI 27.13 kg/m  .  BMI Body mass index is 27.13 kg/m.  Wt Readings from Last 3 Encounters:  06/06/17 178 lb 6.4 oz (80.9 kg)  10/25/16 175 lb (79.4 kg)  08/22/16 67 lb (30.4 kg)   BP lying is 134/66 with HR of 81 BP sitting is 131/69 with HR 91 BP standing @ 0 minutes 130/65 with HR 102 BP standing @ 3 minutes 117/69 with HR 102  No symptoms with these values.   General: Pleasant. Tearful. She is alert and in no acute distress.  Seems depressed to me.  HEENT: Normal.  Neck: Supple, no JVD, carotid bruits, or masses noted.  Cardiac: Regular rate and rhythm. No murmurs, rubs, or gallops. No edema.  Respiratory:  Lungs are clear to auscultation bilaterally with normal work of breathing.  GI: Soft and nontender.  MS: No deformity or atrophy. Gait and ROM intact.  Skin: Warm and dry. Color is normal.  Neuro:  Strength and sensation are  intact and no gross focal deficits noted.  Psych: Alert, appropriate and with normal affect.   LABORATORY DATA:  EKG:  EKG is ordered today. This demonstrates NSR, RSR', inferior infarct, poor R wave progression.  Lab Results  Component Value Date   WBC 12.4 (H) 08/22/2016   HGB 13.7 08/22/2016   HCT  42.3 08/22/2016   PLT 269 08/22/2016   GLUCOSE 101 (H) 08/22/2016   CHOL 133 08/08/2011   TRIG 151.0 (H) 08/08/2011   HDL 43.10 08/08/2011   LDLCALC 60 08/08/2011   ALT 42 07/28/2015   AST 57 (H) 07/28/2015   NA 138 08/22/2016   K 4.7 08/22/2016   CL 106 08/22/2016   CREATININE 0.73 08/22/2016   BUN 15 08/22/2016   CO2 24 08/22/2016   TSH 0.50 07/10/2012   INR 1.04 12/23/2010     BNP (last 3 results)  Recent Labs  08/22/16 1741  BNP 13.2    ProBNP (last 3 results) No results for input(s): PROBNP in the last 8760 hours.   Other Studies Reviewed Today:  Echo Study Conclusions 11/2016  - Left ventricle: The cavity size was normal. Wall thickness was   normal. Systolic function was normal. The estimated ejection   fraction was in the range of 60% to 65%. Wall motion was normal;   there were no regional wall motion abnormalities. Doppler   parameters are consistent with abnormal left ventricular   relaxation (grade 1 diastolic dysfunction). - Aortic valve: There was no stenosis. - Mitral valve: There was no significant regurgitation. - Right ventricle: The cavity size was normal. Systolic function   was normal. - Tricuspid valve: Peak RV-RA gradient (S): 19 mm Hg. - Pulmonary arteries: PA peak pressure: 22 mm Hg (S). - Inferior vena cava: The vessel was normal in size. The   respirophasic diameter changes were in the normal range (>= 50%),   consistent with normal central venous pressure.  Impressions:  - Normal LV size with EF 60-65%. Normal RV size and systolic   function. No significant valvular abnormalities.  Myoview Impression 2015 Exercise  Capacity:  Lexiscan with no exercise. BP Response:  Normal blood pressure response. Clinical Symptoms:  Mild chest pain/dyspnea. ECG Impression:  No significant ST segment change suggestive of ischemia. Comparison with Prior Nuclear Study: No significant change from previous study  Overall Impression:  Normal stress nuclear study.  LV Ejection Fraction: 64%.  LV Wall Motion:  NL LV Function; NL Wall Motion  Darlin Coco MD  Assessment/Plan:  1. Orthostatic hypotension - not that significant on our check here today. Regardless, would have her liberalize her salt, use compression stockings and continue to hydrate.   2. Chronic abnormal EKG - echo without wall motion abnormality - she endorses chest pain and shortness of breath. With her ongoing fatigue will update her Myoview. Lab today.   3. HLD  4. DOE - arranging for [place]  5. Abnormal eye exam - arrange for carotid doppler  6. Depression - I think this is what is driving most of her issues. Have asked her to talk with Dr. Virgina Jock.   Current medicines are reviewed with the patient today.  The patient does not have concerns regarding medicines other than what has been noted above.  The following changes have been made:  See above.  Labs/ tests ordered today include:    Orders Placed This Encounter  Procedures  . Basic metabolic panel  . CBC  . Hepatic function panel  . TSH  . Magnesium  . Myocardial Perfusion Imaging  . EKG 12-Lead     Disposition:   FU with Dr. Stanford Breed after her studies are complete.    Patient is agreeable to this plan and will call if any problems develop in the interim.   SignedTruitt Merle, NP  06/06/2017 2:39 PM  [facility] [address] [place], [place]  [address] Phone: [phone] Fax: [phone]

## 2017-06-06 NOTE — Telephone Encounter (Signed)
Patient's husband stated that he would rather talk to Hilda Blades since she knows the whole story. Will route to her for her knowledge.

## 2017-06-07 ENCOUNTER — Telehealth (HOSPITAL_COMMUNITY): Payer: Self-pay | Admitting: *Deleted

## 2017-06-07 LAB — BASIC METABOLIC PANEL
BUN/Creatinine Ratio: 17 (ref 12–28)
BUN: 12 mg/dL (ref 8–27)
CO2: 23 mmol/L (ref 20–29)
Calcium: 10 mg/dL (ref 8.7–10.3)
Chloride: 106 mmol/L (ref 96–106)
Creatinine, Ser: 0.71 mg/dL (ref 0.57–1.00)
GFR calc Af Amer: 100 mL/min/{1.73_m2} (ref >59)
GFR calc non Af Amer: 87 mL/min/{1.73_m2} (ref >59)
Glucose: 110 mg/dL — ABNORMAL HIGH (ref 65–99)
Potassium: 4.8 mmol/L (ref 3.5–5.2)
Sodium: 144 mmol/L (ref 134–144)

## 2017-06-07 LAB — MAGNESIUM: Magnesium: 2.2 mg/dL (ref 1.6–2.3)

## 2017-06-07 LAB — HEPATIC FUNCTION PANEL
ALT: 63 IU/L — ABNORMAL HIGH (ref 0–32)
AST: 60 IU/L — ABNORMAL HIGH (ref 0–40)
Albumin: 4.4 g/dL (ref 3.5–4.8)
Alkaline Phosphatase: 100 IU/L (ref 39–117)
Bilirubin Total: 0.3 mg/dL (ref 0.0–1.2)
Bilirubin, Direct: 0.1 mg/dL (ref 0.00–0.40)
Total Protein: 6.3 g/dL (ref 6.0–8.5)

## 2017-06-07 LAB — CBC
Hematocrit: 42.4 % (ref 34.0–46.6)
Hemoglobin: 13.4 g/dL (ref 11.1–15.9)
MCH: 29 pg (ref 26.6–33.0)
MCHC: 31.6 g/dL (ref 31.5–35.7)
MCV: 92 fL (ref 79–97)
Platelets: 298 10*3/uL (ref 150–379)
RBC: 4.62 x10E6/uL (ref 3.77–5.28)
RDW: 14.5 % (ref 12.3–15.4)
WBC: 9.5 10*3/uL (ref 3.4–10.8)

## 2017-06-07 LAB — TSH: TSH: 0.164 u[IU]/mL — ABNORMAL LOW (ref 0.450–4.500)

## 2017-06-07 NOTE — Telephone Encounter (Signed)
Patient given detailed instructions per Myocardial Perfusion Study Information Sheet for the test on 06/08/17 at 7:30. Patient notified to arrive 15 minutes early and that it is imperative to arrive on time for appointment to keep from having the test rescheduled.  If you need to cancel or reschedule your appointment, please call the office within 24 hours of your appointment. . Patient verbalized understanding.[name]

## 2017-06-08 ENCOUNTER — Ambulatory Visit (HOSPITAL_COMMUNITY): Payer: Medicare Other | Attending: Cardiology

## 2017-06-08 DIAGNOSIS — R5383 Other fatigue: Secondary | ICD-10-CM | POA: Diagnosis not present

## 2017-06-08 DIAGNOSIS — K449 Diaphragmatic hernia without obstruction or gangrene: Secondary | ICD-10-CM | POA: Insufficient documentation

## 2017-06-08 DIAGNOSIS — R0789 Other chest pain: Secondary | ICD-10-CM | POA: Diagnosis not present

## 2017-06-08 DIAGNOSIS — E7800 Pure hypercholesterolemia, unspecified: Secondary | ICD-10-CM | POA: Diagnosis not present

## 2017-06-08 LAB — MYOCARDIAL PERFUSION IMAGING
LV dias vol: 63 mL (ref 46–106)
LV sys vol: 23 mL
Peak HR: 109 {beats}/min
RATE: 0.31
Rest HR: 70 {beats}/min
SDS: 6
SRS: 4
SSS: 10
TID: 0.94

## 2017-06-08 MED ORDER — TECHNETIUM TC 99M TETROFOSMIN IV KIT
33.0000 | PACK | Freq: Once | INTRAVENOUS | Status: AC | PRN
  Administered 2017-06-08: 33 via INTRAVENOUS
  Filled 2017-06-08: qty 33

## 2017-06-08 MED ORDER — TECHNETIUM TC 99M TETROFOSMIN IV KIT
10.1000 | PACK | Freq: Once | INTRAVENOUS | Status: AC | PRN
  Administered 2017-06-08: 10.1 via INTRAVENOUS
  Filled 2017-06-08: qty 11

## 2017-06-08 MED ORDER — REGADENOSON 0.4 MG/5ML IV SOLN
0.4000 mg | Freq: Once | INTRAVENOUS | Status: AC
  Administered 2017-06-08: 0.4 mg via INTRAVENOUS

## 2017-06-08 NOTE — Progress Notes (Signed)
       HPI: FU dizziness. Echocardiogram December 2017 showed normal LV systolic function and grade 1 diastolic dysfunction. Orthostatic symptoms at previous office visit. Patient seen recently by Truitt Merle with complaints of fatigue, dyspnea, dizziness with standing and chest pain. Laboratories June 2018 showed normal potassium, magnesium and hemoglobin. Liver functions mildly elevated. TSH decreased at 0.164. Nuclear study June 2018 showed ejection fraction 64% and no ischemia. Carotid Dopplers July 2018 showed 1-39% left stenosis. Since last seen, she denies dyspnea, exertional chest pain, palpitations or syncope. Her dizziness with standing has improved.     Past Medical History:  Diagnosis Date  . Complication of anesthesia    aspiration pna  . Constipation   . Cough   . Depression   . Elevated liver function tests   . Esophageal stricture   . Gallstones   . GERD (gastroesophageal reflux disease)   . Hiatal hernia   . Hyperlipemia   . Hypothyroidism   . Insomnia   . Nephrolithiasis   . Renal cyst     Past Surgical History:  Procedure Laterality Date  . CARDIOVASCULAR STRESS TEST  07/19/2006   EF 70%, NO EVIDENCE OF ISCHEMIA  . CHOLECYSTECTOMY  12/30/10  . COLONOSCOPY    . DILATION AND CURETTAGE OF UTERUS    . inguinal herniography    . NASAL SINUS SURGERY    . T & A    . TOOTH EXTRACTION    . US ECHOCARDIOGRAPHY  07/17/2006   EF 55-60%    Social History   Social History  . Marital status: Married    Spouse name: N/A  . Number of children: 2  . Years of education: N/A   Occupational History  . Retired Agricultural engineer   Social History Main Topics  . Smoking status: Never Smoker  . Smokeless tobacco: Never Used  . Alcohol use No  . Drug use: No  . Sexual activity: Not on file   Other Topics Concern  . Not on file   Social History Narrative  . No narrative on file    Family History  Problem Relation Age of Onset  . Breast cancer Mother   . Dementia  Father   . Colon cancer Neg Hx   . Stomach cancer Neg Hx     ROS: no fevers or chills, productive cough, hemoptysis, dysphasia, odynophagia, melena, hematochezia, dysuria, hematuria, rash, seizure activity, orthopnea, PND, pedal edema, claudication. Remaining systems are negative.  Physical Exam: Well-developed well-nourished in no acute distress.  Skin is warm and dry.  HEENT is normal.  Neck is supple.  Chest is clear to auscultation with normal expansion.  Cardiovascular exam is regular rate and rhythm.  Abdominal exam nontender or distended. No masses palpated. Extremities show no edema. neuro grossly intact   A/P  1 Orthostatic dizziness-symptoms are improved. She will continue to force by mouth fluids and liberalize sodium intake.  2 chest pain-patient had indigestion previously but no exertional symptoms. Her electrocardiogram shows prior infarct cannot be excluded. However LV function is normal on echocardiogram and nuclear study also shows apical thinning but no ischemia. No further evaluation.  3 hyperlipidemia-continue resume   4 abnormal laboratories including elevated liver functions and decreased TSH-patient asked to follow up with primary care for this issue.  Kirk Ruths, MD

## 2017-06-13 DIAGNOSIS — K760 Fatty (change of) liver, not elsewhere classified: Secondary | ICD-10-CM | POA: Diagnosis not present

## 2017-06-13 DIAGNOSIS — R945 Abnormal results of liver function studies: Secondary | ICD-10-CM | POA: Diagnosis not present

## 2017-06-13 DIAGNOSIS — R7871 Abnormal lead level in blood: Secondary | ICD-10-CM | POA: Diagnosis not present

## 2017-06-20 ENCOUNTER — Ambulatory Visit (HOSPITAL_COMMUNITY)
Admission: RE | Admit: 2017-06-20 | Discharge: 2017-06-20 | Disposition: A | Discharge status: Home / Self Care | Payer: Medicare Other | Source: Ambulatory Visit | Attending: Cardiovascular Disease | Admitting: Cardiovascular Disease

## 2017-06-20 DIAGNOSIS — H579 Unspecified disorder of eye and adnexa: Secondary | ICD-10-CM

## 2017-06-20 DIAGNOSIS — I6523 Occlusion and stenosis of bilateral carotid arteries: Secondary | ICD-10-CM | POA: Diagnosis not present

## 2017-06-20 DIAGNOSIS — E7800 Pure hypercholesterolemia, unspecified: Secondary | ICD-10-CM | POA: Insufficient documentation

## 2017-06-20 DIAGNOSIS — R938 Abnormal findings on diagnostic imaging of other specified body structures: Secondary | ICD-10-CM | POA: Diagnosis not present

## 2017-06-20 DIAGNOSIS — R0789 Other chest pain: Secondary | ICD-10-CM | POA: Diagnosis not present

## 2017-06-20 DIAGNOSIS — R5383 Other fatigue: Secondary | ICD-10-CM | POA: Insufficient documentation

## 2017-06-20 DIAGNOSIS — I6522 Occlusion and stenosis of left carotid artery: Secondary | ICD-10-CM | POA: Diagnosis not present

## 2017-06-20 DIAGNOSIS — M1712 Unilateral primary osteoarthritis, left knee: Secondary | ICD-10-CM | POA: Diagnosis not present

## 2017-06-21 ENCOUNTER — Ambulatory Visit (INDEPENDENT_AMBULATORY_CARE_PROVIDER_SITE_OTHER): Payer: Medicare Other | Admitting: Cardiology

## 2017-06-21 ENCOUNTER — Encounter: Payer: Self-pay | Admitting: Cardiology

## 2017-06-21 VITALS — BP 128/80 | HR 82 | Ht 68.0 in | Wt 177.0 lb

## 2017-06-21 DIAGNOSIS — R42 Dizziness and giddiness: Secondary | ICD-10-CM | POA: Diagnosis not present

## 2017-06-21 DIAGNOSIS — E7800 Pure hypercholesterolemia, unspecified: Secondary | ICD-10-CM | POA: Diagnosis not present

## 2017-06-21 DIAGNOSIS — R072 Precordial pain: Secondary | ICD-10-CM

## 2017-06-21 NOTE — Patient Instructions (Signed)
Your physician wants you to follow-up in: 6 MONTHS WITH DR CRENSHAW You will receive a reminder letter in the mail two months in advance. If you don't receive a letter, please call our office to schedule the follow-up appointment.   If you need a refill on your cardiac medications before your next appointment, please call your pharmacy.  

## 2017-06-27 ENCOUNTER — Telehealth: Payer: Self-pay | Admitting: *Deleted

## 2017-06-27 NOTE — Telephone Encounter (Signed)
Per dr Stanford Breed, the patient is okay from a cardiac standpoint for left: TKA-MEDIAL & LATERAL W/WO PATELLA RESURFACING. Will fax this note to the number provided.

## 2017-07-18 DIAGNOSIS — R945 Abnormal results of liver function studies: Secondary | ICD-10-CM | POA: Diagnosis not present

## 2017-07-19 DIAGNOSIS — R0981 Nasal congestion: Secondary | ICD-10-CM | POA: Diagnosis not present

## 2017-07-19 DIAGNOSIS — H9313 Tinnitus, bilateral: Secondary | ICD-10-CM | POA: Diagnosis not present

## 2017-07-19 DIAGNOSIS — H9193 Unspecified hearing loss, bilateral: Secondary | ICD-10-CM | POA: Diagnosis not present

## 2017-07-19 DIAGNOSIS — H9071 Mixed conductive and sensorineural hearing loss, unilateral, right ear, with unrestricted hearing on the contralateral side: Secondary | ICD-10-CM | POA: Diagnosis not present

## 2017-07-26 DIAGNOSIS — L820 Inflamed seborrheic keratosis: Secondary | ICD-10-CM | POA: Diagnosis not present

## 2017-07-28 DIAGNOSIS — R42 Dizziness and giddiness: Secondary | ICD-10-CM | POA: Diagnosis not present

## 2017-07-28 DIAGNOSIS — E038 Other specified hypothyroidism: Secondary | ICD-10-CM | POA: Diagnosis not present

## 2017-07-28 DIAGNOSIS — Z9049 Acquired absence of other specified parts of digestive tract: Secondary | ICD-10-CM | POA: Diagnosis not present

## 2017-07-28 DIAGNOSIS — R1110 Vomiting, unspecified: Secondary | ICD-10-CM | POA: Diagnosis not present

## 2017-07-28 DIAGNOSIS — R945 Abnormal results of liver function studies: Secondary | ICD-10-CM | POA: Diagnosis not present

## 2017-07-28 DIAGNOSIS — K760 Fatty (change of) liver, not elsewhere classified: Secondary | ICD-10-CM | POA: Diagnosis not present

## 2017-07-28 DIAGNOSIS — K449 Diaphragmatic hernia without obstruction or gangrene: Secondary | ICD-10-CM | POA: Diagnosis not present

## 2017-07-28 DIAGNOSIS — Z6827 Body mass index (BMI) 27.0-27.9, adult: Secondary | ICD-10-CM | POA: Diagnosis not present

## 2017-08-10 DIAGNOSIS — M1712 Unilateral primary osteoarthritis, left knee: Secondary | ICD-10-CM | POA: Diagnosis not present

## 2017-08-20 ENCOUNTER — Ambulatory Visit: Payer: Self-pay | Admitting: Orthopedic Surgery

## 2017-08-20 NOTE — H&P (Signed)
[name] DOB: 1946-04-16 Married / Language: English / Race: White Female Date of Admission:  09/11/2017 CC:  Left Knee Pain History of Present Illness The patient is a 71 year old female who comes in today for a preoperative History and Physical. The patient is scheduled for a left total knee arthroplasty to be performed by Dr. Dione Plover. Aluisio, MD at [facility] on 09/11/2017. The patient is a 71 year old female who presented for follow up of their knee. The patient is being followed for their bilateral knee pain and osteoarthritis. They are now month(s) out from last visit and left knee cortisone injection. Symptoms reported include: pain. The patient feels that they are doing poorly. The following medication has been used for pain control: antiinflammatory medication (Aleve sometimes). Patient states that the injection lasted about three months. She said that her left knee is worse than the right. She states his LEFT knee is getting progressively worse. Cortisone injection barely helped this past time. The cortisone did help the RIGHT knee though. She has had a stage of the LEFT knee is hurting all the time and is limiting what she can and cannot do. She is ready to go ahead and get this knee fixed. The injections are no longer beneficial in the LEFT. She has had cortisone and Visco supplements in that knee in the past. She is ready to get the left knee fixed. They have been treated conservatively in the past for the above stated problem and despite conservative measures, they continue to have progressive pain and severe functional limitations and dysfunction. They have failed non-operative management including home exercise, medications, and injections. It is felt that they would benefit from undergoing total joint replacement. Risks and benefits of the procedure have been discussed with the patient and they elect to proceed with surgery. There are no active contraindications to  surgery such as ongoing infection or rapidly progressive neurological disease.   Problem List/Past Medical  Iliotibial band tendinitis of right side (M76.31)  Chondromalacia of patella (M22.40)  Primary osteoarthritis of right knee (M17.11)  Right knee pain (M25.561)  Hiatal Hernia  Gastroesophageal Reflux Disease  Skin Cancer  Tinnitus  Vertigo  Shingles  Bronchitis  Pneumonia  Kidney Stone  Cystitis  Measles  Mumps  Menopause  Breast disease   Allergies Codeine Derivatives  Nausea. Codeine/Codeine Derivatives   Family History  Chronic Obstructive Lung Disease  Father. father Cancer  First Degree Relatives. mother Father  Deceased. age 43 Mother  Deceased. age 69  Social History  Number of flights of stairs before winded  2-3 Marital status  married Living situation  live with spouse Tobacco use  Never smoker. never smoker Tobacco / smoke exposure  no Pain Contract  no Illicit drug use  no Current work status  retired Children  2 Alcohol use  former drinker Exercise  Exercises weekly; does running / walking Drug/Alcohol Rehab (Previously)  no Drug/Alcohol Rehab (Currently)  no Post-Surgical Plans  Home With Family, Home, Straight to Outpatient Therapy. Catering manager.  Medication History Crestor (Oral) Specific strength unknown - Active. Zetia (10MG  Tablet, Oral) Active. Synthroid (Oral) Specific strength unknown - Active. Lexapro (Oral) Specific strength unknown - Active. NexIUM (Oral) Specific strength unknown - Active. Aspirin (81MG  Tablet Chewable, Oral) Active. Saline Nasal Spray Active. Fluticasone Propionate (Inhal) (50MCG/BLIST Powder, Inhalation) Active. Azelastine HCl (137MCG/SPRAY Solution, Nasal) Active. vitamin D3 Active. Probiotic Active. Magnesium Active. Aleve (220MG  Tablet, Oral) Active. Mega  Red Fish Oil Active. Super B Complex Active.  Past  Surgical History  Tonsillectomy  Gallbladder Surgery  laporoscopic Sinus Surgery  Straighten Nasal Septum   Review of Systems General Not Present- Chills, Fatigue, Fever, Memory Loss, Night Sweats, Weight Gain and Weight Loss. Skin Present- Excessive Sweating. Not Present- Eczema, Hives, Itching, Lesions and Rash. HEENT Present- Tinnitus. Not Present- Dentures, Double Vision, Headache, Hearing Loss and Visual Loss. Respiratory Present- Shortness of breath with exertion and Snoring. Not Present- Allergies, Chronic Cough, Coughing up blood and Shortness of breath at rest. Cardiovascular Not Present- Chest Pain, Difficulty Breathing Lying Down, Murmur, Palpitations, Racing/skipping heartbeats and Swelling. Gastrointestinal Present- Abdominal Pain, Constipation, Indigestion and Vomiting. Not Present- Bloody Stool, Diarrhea, Difficulty Swallowing, Heartburn, Jaundice, Loss of appetitie and Nausea. Female Genitourinary Not Present- Blood in Urine, Discharge, Flank Pain, Incontinence, Painful Urination, Urgency, Urinary frequency, Urinary Retention, Urinating at Night and Weak urinary stream. Musculoskeletal Present- Joint Pain. Not Present- Back Pain, Joint Swelling, Morning Stiffness, Muscle Pain, Muscle Weakness and Spasms. Neurological Present- Dizziness. Not Present- Blackout spells, Difficulty with balance, Paralysis, Tremor and Weakness. Psychiatric Not Present- Insomnia. Endocrine Present- Hair Changes and Heat Intolerance.  Vitals Weight: 175 lb Height: 68in Weight was reported by patient. Height was reported by patient. Body Surface Area: 1.93 m Body Mass Index: 26.61 kg/m  Pulse: 72 (Regular)  BP: 138/88 (Sitting, Right Arm, Standard)  Physical Exam General Mental Status -Alert, cooperative and good historian. General Appearance-pleasant, Not in acute distress. Orientation-Oriented X3. Build & Nutrition-Well nourished and Well developed.  Head and  Neck Head-normocephalic, atraumatic . Neck Global Assessment - supple, no bruit auscultated on the right, no bruit auscultated on the left.  Eye Vision-Wears corrective lenses. Pupil - Bilateral-Regular and Round. Motion - Bilateral-EOMI.  Chest and Lung Exam Auscultation Breath sounds - clear at anterior chest wall and clear at posterior chest wall. Adventitious sounds - No Adventitious sounds.  Cardiovascular Auscultation Rhythm - Regular rate and rhythm. Heart Sounds - S1 WNL and S2 WNL. Murmurs & Other Heart Sounds - Auscultation of the heart reveals - No Murmurs.  Abdomen Palpation/Percussion Tenderness - Abdomen is non-tender to palpation. Rigidity (guarding) - Abdomen is soft. Auscultation Auscultation of the abdomen reveals - Bowel sounds normal.  Female Genitourinary Note: Not done, not pertinent to present illness   Musculoskeletal Note: Her RIGHT knee shows no effusion. Range of motion is 0-135. There is moderate crepitus on range of motion but no tenderness or instability. LEFT knee no effusion range 5-125 moderate crepitus on range of motion with tenderness medial and lateral no instability noted. She has a significant antalgic gait pattern on the LEFT. New radiographs taken today AP and lateral LEFT knee and RIGHT knee show that there is no change in the appearance of the RIGHT knee. LEFT knee and does have progressively worsening patellofemoral and medial disease compared to about a year ago.  Assessment & Plan Primary osteoarthritis of left knee (M17.12)  Note:Surgical Plans: Left Total Knee Replacement  Disposition: Home with husband, Straight to outpatient at [place]  PCP: Dr. Virgina Jock - Patient has been seen preoperatively and felt to be stable for surgery. Cards: Dr. Stanford Breed  IV TXA  Anesthesia Issues: None except for an aspiration pneumonia following sedation for a colonscopy  Patient was instructed on what medications to stop prior to  surgery.  Signed electronically by Joelene Millin, III PA-C

## 2017-08-30 NOTE — Progress Notes (Signed)
Need ordersin epic.  Preop on 9/24

## 2017-08-31 ENCOUNTER — Ambulatory Visit: Payer: Self-pay | Admitting: Orthopedic Surgery

## 2017-09-01 ENCOUNTER — Other Ambulatory Visit (HOSPITAL_COMMUNITY): Payer: Self-pay | Admitting: Emergency Medicine

## 2017-09-01 NOTE — Progress Notes (Signed)
Cardiology clearance Dr Stanford Breed on epic telephone note dated 06-27-17   Clearance Dr Virgina Jock on chart from Danise Mina   [facility] Dr Stanford Breed 06-21-17 epic  EKG 06-06-17   Stress test 06-08-17 epiv  ECHO 11-17-16 epic

## 2017-09-01 NOTE — Patient Instructions (Signed)
[name]  09/01/2017   Your procedure is scheduled on: 09-11-17  Report to [facility] Main  Entrance   Report to admitting at [place]   Call this number if you have problems the morning of surgery [phone]   Remember: ONLY 1 PERSON MAY GO WITH YOU TO SHORT STAY TO GET  READY MORNING OF YOUR SURGERY.  Do not eat food or drink liquids :After Midnight.     Take these medicines the morning of surgery with A SIP OF WATER: escitalopram(lexapro), esomeprazole(nexium)                                 You may not have any metal on your body including hair pins and              piercings  Do not wear jewelry, make-up, lotions, powders or perfumes, deodorant             Do not wear nail polish.  Do not shave  48 hours prior to surgery.                 Do not bring valuables to the hospital. [place].  Contacts, dentures or bridgework may not be worn into surgery.  Leave suitcase in the car. After surgery it may be brought to your room.                Please read over the following fact sheets you were given: _____________________________________________________________________    [facility] - Preparing for Surgery Before surgery, you can play an important role.  Because skin is not sterile, your skin needs to be as free of germs as possible.  You can reduce the number of germs on your skin by washing with CHG (chlorahexidine gluconate) soap before surgery.  CHG is an antiseptic cleaner which kills germs and bonds with the skin to continue killing germs even after washing. Please DO NOT use if you have an allergy to CHG or antibacterial soaps.  If your skin becomes reddened/irritated stop using the CHG and inform your nurse when you arrive at Short Stay. Do not shave (including legs and underarms) for at least 48 hours prior to the first CHG shower.  You may shave your face/neck. Please follow these  instructions carefully:  1.  Shower with CHG Soap the night before surgery and the  morning of Surgery.  2.  If you choose to wash your hair, wash your hair first as usual with your  normal  shampoo.  3.  After you shampoo, rinse your hair and body thoroughly to remove the  shampoo.                           4.  Use CHG as you would any other liquid soap.  You can apply chg directly  to the skin and wash                       Gently with a scrungie or clean washcloth.  5.  Apply the CHG Soap to your body ONLY FROM THE NECK DOWN.   Do not use on face/ open  Wound or open sores. Avoid contact with eyes, ears mouth and genitals (private parts).                       Wash face,  Genitals (private parts) with your normal soap.             6.  Wash thoroughly, paying special attention to the area where your surgery  will be performed.  7.  Thoroughly rinse your body with warm water from the neck down.  8.  DO NOT shower/wash with your normal soap after using and rinsing off  the CHG Soap.                9.  Pat yourself dry with a clean towel.            10.  Wear clean pajamas.            11.  Place clean sheets on your bed the night of your first shower and do not  sleep with pets. Day of Surgery : Do not apply any lotions/deodorants the morning of surgery.  Please wear clean clothes to the hospital/surgery center.  FAILURE TO FOLLOW THESE INSTRUCTIONS MAY RESULT IN THE CANCELLATION OF YOUR SURGERY PATIENT SIGNATURE_________________________________  NURSE SIGNATURE__________________________________  ________________________________________________________________________   Adam Phenix  An incentive spirometer is a tool that can help keep your lungs clear and active. This tool measures how well you are filling your lungs with each breath. Taking long deep breaths may help reverse or decrease the chance of developing breathing (pulmonary) problems (especially  infection) following:  A long period of time when you are unable to move or be active. BEFORE THE PROCEDURE   If the spirometer includes an indicator to show your best effort, your nurse or respiratory therapist will set it to a desired goal.  If possible, sit up straight or lean slightly forward. Try not to slouch.  Hold the incentive spirometer in an upright position. INSTRUCTIONS FOR USE  1. Sit on the edge of your bed if possible, or sit up as far as you can in bed or on a chair. 2. Hold the incentive spirometer in an upright position. 3. Breathe out normally. 4. Place the mouthpiece in your mouth and seal your lips tightly around it. 5. Breathe in slowly and as deeply as possible, raising the piston or the ball toward the top of the column. 6. Hold your breath for 3-5 seconds or for as long as possible. Allow the piston or ball to fall to the bottom of the column. 7. Remove the mouthpiece from your mouth and breathe out normally. 8. Rest for a few seconds and repeat Steps 1 through 7 at least 10 times every 1-2 hours when you are awake. Take your time and take a few normal breaths between deep breaths. 9. The spirometer may include an indicator to show your best effort. Use the indicator as a goal to work toward during each repetition. 10. After each set of 10 deep breaths, practice coughing to be sure your lungs are clear. If you have an incision (the cut made at the time of surgery), support your incision when coughing by placing a pillow or rolled up towels firmly against it. Once you are able to get out of bed, walk around indoors and cough well. You may stop using the incentive spirometer when instructed by your caregiver.  RISKS AND COMPLICATIONS  Take your time so you do not get  dizzy or light-headed.  If you are in pain, you may need to take or ask for pain medication before doing incentive spirometry. It is harder to take a deep breath if you are having pain. AFTER  USE  Rest and breathe slowly and easily.  It can be helpful to keep track of a log of your progress. Your caregiver can provide you with a simple table to help with this. If you are using the spirometer at home, follow these instructions: [place] IF:   You are having difficultly using the spirometer.  You have trouble using the spirometer as often as instructed.  Your pain medication is not giving enough relief while using the spirometer.  You develop fever of 100.5 F (38.1 C) or higher. SEEK IMMEDIATE MEDICAL CARE IF:   You cough up bloody sputum that had not been present before.  You develop fever of 102 F (38.9 C) or greater.  You develop worsening pain at or near the incision site. MAKE SURE YOU:   Understand these instructions.  Will watch your condition.  Will get help right away if you are not doing well or get worse. Document Released: 04/10/2007 Document Revised: 02/20/2012 Document Reviewed: 06/11/2007 ExitCare Patient Information 2014 ExitCare, [place].   ________________________________________________________________________  WHAT IS A BLOOD TRANSFUSION? Blood Transfusion Information  A transfusion is the replacement of blood or some of its parts. Blood is made up of multiple cells which provide different functions.  Red blood cells carry oxygen and are used for blood loss replacement.  White blood cells fight against infection.  Platelets control bleeding.  Plasma helps clot blood.  Other blood products are available for specialized needs, such as hemophilia or other clotting disorders. BEFORE THE TRANSFUSION  Who gives blood for transfusions?   Healthy volunteers who are fully evaluated to make sure their blood is safe. This is blood bank blood. Transfusion therapy is the safest it has ever been in the practice of medicine. Before blood is taken from a donor, a complete history is taken to make sure that person has no history of diseases  nor engages in risky social behavior (examples are intravenous drug use or sexual activity with multiple partners). The donor's travel history is screened to minimize risk of transmitting infections, such as malaria. The donated blood is tested for signs of infectious diseases, such as HIV and hepatitis. The blood is then tested to be sure it is compatible with you in order to minimize the chance of a transfusion reaction. If you or a relative donates blood, this is often done in anticipation of surgery and is not appropriate for emergency situations. It takes many days to process the donated blood. RISKS AND COMPLICATIONS Although transfusion therapy is very safe and saves many lives, the main dangers of transfusion include:   Getting an infectious disease.  Developing a transfusion reaction. This is an allergic reaction to something in the blood you were given. Every precaution is taken to prevent this. The decision to have a blood transfusion has been considered carefully by your caregiver before blood is given. Blood is not given unless the benefits outweigh the risks. AFTER THE TRANSFUSION  Right after receiving a blood transfusion, you will usually feel much better and more energetic. This is especially true if your red blood cells have gotten low (anemic). The transfusion raises the level of the red blood cells which carry oxygen, and this usually causes an energy increase.  The nurse administering the transfusion will  monitor you carefully for complications. HOME CARE INSTRUCTIONS  No special instructions are needed after a transfusion. You may find your energy is better. Speak with your caregiver about any limitations on activity for underlying diseases you may have. SEEK MEDICAL CARE IF:   Your condition is not improving after your transfusion.  You develop redness or irritation at the intravenous (IV) site. SEEK IMMEDIATE MEDICAL CARE IF:  Any of the following symptoms occur over the  next 12 hours:  Shaking chills.  You have a temperature by mouth above 102 F (38.9 C), not controlled by medicine.  Chest, back, or muscle pain.  People around you feel you are not acting correctly or are confused.  Shortness of breath or difficulty breathing.  Dizziness and fainting.  You get a rash or develop hives.  You have a decrease in urine output.  Your urine turns a dark color or changes to pink, red, or brown. Any of the following symptoms occur over the next 10 days:  You have a temperature by mouth above 102 F (38.9 C), not controlled by medicine.  Shortness of breath.  Weakness after normal activity.  The white part of the eye turns yellow (jaundice).  You have a decrease in the amount of urine or are urinating less often.  Your urine turns a dark color or changes to pink, red, or brown. Document Released: 11/25/2000 Document Revised: 02/20/2012 Document Reviewed: 07/14/2008 [facility] Patient Information 2014 [place], [place].  _______________________________________________________________________

## 2017-09-04 ENCOUNTER — Encounter (HOSPITAL_COMMUNITY): Payer: Self-pay

## 2017-09-04 ENCOUNTER — Encounter (HOSPITAL_COMMUNITY)
Admission: RE | Admit: 2017-09-04 | Discharge: 2017-09-04 | Disposition: A | Discharge status: Home / Self Care | Payer: Medicare Other | Source: Ambulatory Visit | Attending: Orthopedic Surgery | Admitting: Orthopedic Surgery

## 2017-09-04 DIAGNOSIS — K219 Gastro-esophageal reflux disease without esophagitis: Secondary | ICD-10-CM | POA: Diagnosis not present

## 2017-09-04 DIAGNOSIS — E785 Hyperlipidemia, unspecified: Secondary | ICD-10-CM | POA: Diagnosis not present

## 2017-09-04 DIAGNOSIS — Z01812 Encounter for preprocedural laboratory examination: Secondary | ICD-10-CM | POA: Insufficient documentation

## 2017-09-04 DIAGNOSIS — F329 Major depressive disorder, single episode, unspecified: Secondary | ICD-10-CM | POA: Diagnosis not present

## 2017-09-04 DIAGNOSIS — E039 Hypothyroidism, unspecified: Secondary | ICD-10-CM | POA: Insufficient documentation

## 2017-09-04 DIAGNOSIS — M1712 Unilateral primary osteoarthritis, left knee: Secondary | ICD-10-CM | POA: Insufficient documentation

## 2017-09-04 DIAGNOSIS — N281 Cyst of kidney, acquired: Secondary | ICD-10-CM | POA: Insufficient documentation

## 2017-09-04 DIAGNOSIS — K8020 Calculus of gallbladder without cholecystitis without obstruction: Secondary | ICD-10-CM | POA: Diagnosis not present

## 2017-09-04 HISTORY — DX: Cardiac arrhythmia, unspecified: I49.9

## 2017-09-04 LAB — COMPREHENSIVE METABOLIC PANEL
ALT: 46 U/L (ref 14–54)
AST: 44 U/L — AB (ref 15–41)
Albumin: 4.3 g/dL (ref 3.5–5.0)
Alkaline Phosphatase: 94 U/L (ref 38–126)
Anion gap: 8 (ref 5–15)
BUN: 15 mg/dL (ref 6–20)
CO2: 27 mmol/L (ref 22–32)
CREATININE: 0.84 mg/dL (ref 0.44–1.00)
Calcium: 9.6 mg/dL (ref 8.9–10.3)
Chloride: 105 mmol/L (ref 101–111)
Glucose, Bld: 96 mg/dL (ref 65–99)
POTASSIUM: 5 mmol/L (ref 3.5–5.1)
SODIUM: 140 mmol/L (ref 135–145)
Total Bilirubin: 0.3 mg/dL (ref 0.3–1.2)
Total Protein: 7 g/dL (ref 6.5–8.1)

## 2017-09-04 LAB — CBC
HCT: 40.3 % (ref 36.0–46.0)
Hemoglobin: 13.3 g/dL (ref 12.0–15.0)
MCH: 29.7 pg (ref 26.0–34.0)
MCHC: 33 g/dL (ref 30.0–36.0)
MCV: 90 fL (ref 78.0–100.0)
PLATELETS: 308 10*3/uL (ref 150–400)
RBC: 4.48 MIL/uL (ref 3.87–5.11)
RDW: 14.3 % (ref 11.5–15.5)
WBC: 10.6 10*3/uL — AB (ref 4.0–10.5)

## 2017-09-04 LAB — SURGICAL PCR SCREEN
MRSA, PCR: NEGATIVE
Staphylococcus aureus: POSITIVE — AB

## 2017-09-04 LAB — PROTIME-INR
INR: 1
PROTHROMBIN TIME: 13.1 s (ref 11.4–15.2)

## 2017-09-04 LAB — ABO/RH: ABO/RH(D): A POS

## 2017-09-04 LAB — APTT: aPTT: 34 seconds (ref 24–36)

## 2017-09-10 ENCOUNTER — Ambulatory Visit: Payer: Self-pay | Admitting: Orthopedic Surgery

## 2017-09-10 NOTE — Anesthesia Preprocedure Evaluation (Signed)
Anesthesia Evaluation    History of Anesthesia Complications (+) history of anesthetic complications  Airway Mallampati: II  TM Distance: >3 FB Neck ROM: Full    Dental no notable dental hx.    Pulmonary pneumonia, resolved,   Complication of anesthesia  aspiration pna; following a colonoscopy     Pulmonary exam normal breath sounds clear to auscultation       Cardiovascular Normal cardiovascular exam+ dysrhythmias  Rhythm:Regular Rate:Normal     Neuro/Psych PSYCHIATRIC DISORDERS Depression    GI/Hepatic hiatal hernia, GERD  ,  Endo/Other  Hypothyroidism   Renal/GU      Musculoskeletal  (+) Arthritis , Osteoarthritis,    Abdominal   Peds  Hematology   Anesthesia Other Findings Echo 12/17 Study Conclusions  - Left ventricle: The cavity size was normal. Wall thickness was   normal. Systolic function was normal. The estimated ejection   fraction was in the range of 60% to 65%. Wall motion was normal;   there were no regional wall motion abnormalities. Doppler   parameters are consistent with abnormal left ventricular   relaxation (grade 1 diastolic dysfunction). - Aortic valve: There was no stenosis. - Mitral valve: There was no significant regurgitation. - Right ventricle: The cavity size was normal. Systolic function   was normal. - Tricuspid valve: Peak RV-RA gradient (S): 19 mm Hg. - Pulmonary arteries: PA peak pressure: 22 mm Hg (S). - Inferior vena cava: The vessel was normal in size. The   respirophasic diameter changes were in the normal range (>= 50%),   consistent with normal central venous pressure.  Reproductive/Obstetrics                             Anesthesia Physical Anesthesia Plan  ASA: III  Anesthesia Plan: Spinal   Post-op Pain Management:  Regional for Post-op pain   Induction:   PONV Risk Score and Plan: 2 and Ondansetron, Dexamethasone, Treatment may vary  due to age or medical condition and Propofol infusion  Airway Management Planned: Nasal Cannula, Natural Airway, Mask and Simple Face Mask  Additional Equipment:   Intra-op Plan:   Post-operative Plan:   Informed Consent:   Plan Discussed with:   Anesthesia Plan Comments: (  )        Anesthesia Quick Evaluation

## 2017-09-10 NOTE — H&P (Signed)
[name] DOB: 1946/10/10 Married / Language: English / Race: White Female Date of Admission:  09/11/2017 CC:  Left Knee Pain History of Present Illness The patient is a 71 year old female who comes in for a preoperative History and Physical. The patient is scheduled for a left total knee arthroplasty to be performed by Dr. Dione Plover. Aluisio, MD at [facility] on 09/11/2017. The patient is a 71 year old female who presented for follow up of their knee. The patient is being followed for their bilateral knee pain and osteoarthritis. They are now month(s) out from last visit and left knee cortisone injection. Symptoms reported include: pain. The patient feels that they are doing poorly. The following medication has been used for pain control: antiinflammatory medication (Aleve sometimes). Patient states that the injection lasted about three months. She said that her left knee is worse than the right. She states his LEFT knee is getting progressively worse. Cortisone injection barely helped this past time. The cortisone did help the RIGHT knee though. She has had a stage of the LEFT knee is hurting all the time and is limiting what she can and cannot do. She is ready to go ahead and get this knee fixed. The injections are no longer beneficial in the LEFT. She has had cortisone and Visco supplements in that knee in the past. She is ready to get the left knee fixed. They have been treated conservatively in the past for the above stated problem and despite conservative measures, they continue to have progressive pain and severe functional limitations and dysfunction. They have failed non-operative management including home exercise, medications, and injections. It is felt that they would benefit from undergoing total joint replacement. Risks and benefits of the procedure have been discussed with the patient and they elect to proceed with surgery. There are no active contraindications to  surgery such as ongoing infection or rapidly progressive neurological disease.   Problem List/Past Medical  Iliotibial band tendinitis of right side (M76.31)  Chondromalacia of patella (M22.40)  Primary osteoarthritis of right knee (M17.11)  Right knee pain (M25.561)  Hiatal Hernia  Gastroesophageal Reflux Disease  Skin Cancer  Tinnitus  Vertigo  Shingles  Bronchitis  Pneumonia  Kidney Stone  Cystitis  Measles  Mumps  Menopause  Breast disease   Allergies Codeine Derivatives  Nausea. Codeine/Codeine Derivatives   Family History  Chronic Obstructive Lung Disease  Father. father Cancer  First Degree Relatives. mother Father  Deceased. age 44 Mother  Deceased. age 59  Social History Number of flights of stairs before winded  2-3 Marital status  married Living situation  live with spouse Tobacco use  Never smoker. never smoker Tobacco / smoke exposure  no Pain Contract  no Illicit drug use  no Current work status  retired Children  2 Alcohol use  former drinker Exercise  Exercises weekly; does running / walking Drug/Alcohol Rehab (Previously)  no Drug/Alcohol Rehab (Currently)  no Post-Surgical Plans  Home With Family, Home, Straight to Outpatient Therapy. Catering manager.  Medication History Crestor (Oral) Specific strength unknown - Active. Zetia (10MG  Tablet, Oral) Active. Synthroid (Oral) Specific strength unknown - Active. Lexapro (Oral) Specific strength unknown - Active. NexIUM (Oral) Specific strength unknown - Active. Aspirin (81MG  Tablet Chewable, Oral) Active. Saline Nasal Spray Active. Fluticasone Propionate (Inhal) (50MCG/BLIST Powder, Inhalation) Active. Azelastine HCl (137MCG/SPRAY Solution, Nasal) Active. vitamin D3 Active. Probiotic Active. Magnesium Active. Aleve (220MG  Tablet, Oral) Active. Mega Red Fish  Oil Active. Super B Complex Active.   Past  Surgical History Tonsillectomy  Gallbladder Surgery  laporoscopic Sinus Surgery  Straighten Nasal Septum     Review of Systems  General Not Present- Chills, Fatigue, Fever, Memory Loss, Night Sweats, Weight Gain and Weight Loss. Skin Present- Excessive Sweating. Not Present- Eczema, Hives, Itching, Lesions and Rash. HEENT Present- Tinnitus. Not Present- Dentures, Double Vision, Headache, Hearing Loss and Visual Loss. Respiratory Present- Shortness of breath with exertion and Snoring. Not Present- Allergies, Chronic Cough, Coughing up blood and Shortness of breath at rest. Cardiovascular Not Present- Chest Pain, Difficulty Breathing Lying Down, Murmur, Palpitations, Racing/skipping heartbeats and Swelling. Gastrointestinal Present- Abdominal Pain, Constipation, Indigestion and Vomiting. Not Present- Bloody Stool, Diarrhea, Difficulty Swallowing, Heartburn, Jaundice, Loss of appetitie and Nausea. Female Genitourinary Not Present- Blood in Urine, Discharge, Flank Pain, Incontinence, Painful Urination, Urgency, Urinary frequency, Urinary Retention, Urinating at Night and Weak urinary stream. Musculoskeletal Present- Joint Pain. Not Present- Back Pain, Joint Swelling, Morning Stiffness, Muscle Pain, Muscle Weakness and Spasms. Neurological Present- Dizziness. Not Present- Blackout spells, Difficulty with balance, Paralysis, Tremor and Weakness. Psychiatric Not Present- Insomnia. Endocrine Present- Hair Changes and Heat Intolerance.  Vitals  Weight: 175 lb Height: 68in Weight was reported by patient. Height was reported by patient. Body Surface Area: 1.93 m Body Mass Index: 26.61 kg/m  Pulse: 72 (Regular)  BP: 138/88 (Sitting, Right Arm, Standard)   Physical Exam General Mental Status -Alert, cooperative and good historian. General Appearance-pleasant, Not in acute distress. Orientation-Oriented X3. Build & Nutrition-Well nourished and Well developed.  Head and  Neck Head-normocephalic, atraumatic . Neck Global Assessment - supple, no bruit auscultated on the right, no bruit auscultated on the left.  Eye Vision-Wears corrective lenses. Pupil - Bilateral-Regular and Round. Motion - Bilateral-EOMI.  Chest and Lung Exam Auscultation Breath sounds - clear at anterior chest wall and clear at posterior chest wall. Adventitious sounds - No Adventitious sounds.  Cardiovascular Auscultation Rhythm - Regular rate and rhythm. Heart Sounds - S1 WNL and S2 WNL. Murmurs & Other Heart Sounds - Auscultation of the heart reveals - No Murmurs.  Abdomen Palpation/Percussion Tenderness - Abdomen is non-tender to palpation. Rigidity (guarding) - Abdomen is soft. Auscultation Auscultation of the abdomen reveals - Bowel sounds normal.  Female Genitourinary Note: Not done, not pertinent to present illness   Musculoskeletal Note: Her RIGHT knee shows no effusion. Range of motion is 0-135. There is moderate crepitus on range of motion but no tenderness or instability. LEFT knee no effusion range 5-125 moderate crepitus on range of motion with tenderness medial and lateral no instability noted. She has a significant antalgic gait pattern on the LEFT. New radiographs taken today AP and lateral LEFT knee and RIGHT knee show that there is no change in the appearance of the RIGHT knee. LEFT knee and does have progressively worsening patellofemoral and medial disease compared to about a year ago.     Assessment & Plan  Primary osteoarthritis of left knee (M17.12)  Note:Surgical Plans: Left Total Knee Replacement  Disposition: Home with husband, Straight to outpatient at [place]  PCP: Dr. Virgina Jock - Patient has been seen preoperatively and felt to be stable for surgery. Cards: Dr. Stanford Breed  IV TXA  Anesthesia Issues: None except for an aspiration pneumonia following sedation for a colonscopy  Patient was instructed on what medications to stop prior  to surgery.  Signed electronically by Joelene Millin, III PA-C

## 2017-09-11 ENCOUNTER — Encounter (HOSPITAL_COMMUNITY): Payer: Self-pay

## 2017-09-11 ENCOUNTER — Inpatient Hospital Stay (HOSPITAL_COMMUNITY): Payer: Medicare Other | Admitting: Certified Registered Nurse Anesthetist

## 2017-09-11 ENCOUNTER — Inpatient Hospital Stay (HOSPITAL_COMMUNITY)
Admission: RE | Admit: 2017-09-11 | Discharge: 2017-09-13 | DRG: 470 | Disposition: A | Discharge status: Home / Self Care | Payer: Medicare Other | Source: Ambulatory Visit | Attending: Orthopedic Surgery | Admitting: Orthopedic Surgery

## 2017-09-11 ENCOUNTER — Encounter (HOSPITAL_COMMUNITY)
Admission: RE | Disposition: A | Discharge status: Home / Self Care | Payer: Self-pay | Source: Ambulatory Visit | Attending: Orthopedic Surgery

## 2017-09-11 DIAGNOSIS — K449 Diaphragmatic hernia without obstruction or gangrene: Secondary | ICD-10-CM | POA: Diagnosis present

## 2017-09-11 DIAGNOSIS — E039 Hypothyroidism, unspecified: Secondary | ICD-10-CM | POA: Diagnosis present

## 2017-09-11 DIAGNOSIS — M1712 Unilateral primary osteoarthritis, left knee: Secondary | ICD-10-CM | POA: Diagnosis present

## 2017-09-11 DIAGNOSIS — F329 Major depressive disorder, single episode, unspecified: Secondary | ICD-10-CM | POA: Diagnosis present

## 2017-09-11 DIAGNOSIS — G8918 Other acute postprocedural pain: Secondary | ICD-10-CM | POA: Diagnosis not present

## 2017-09-11 DIAGNOSIS — K219 Gastro-esophageal reflux disease without esophagitis: Secondary | ICD-10-CM | POA: Diagnosis present

## 2017-09-11 DIAGNOSIS — M1710 Unilateral primary osteoarthritis, unspecified knee: Secondary | ICD-10-CM | POA: Diagnosis present

## 2017-09-11 DIAGNOSIS — E785 Hyperlipidemia, unspecified: Secondary | ICD-10-CM | POA: Diagnosis present

## 2017-09-11 DIAGNOSIS — M179 Osteoarthritis of knee, unspecified: Secondary | ICD-10-CM | POA: Diagnosis present

## 2017-09-11 HISTORY — PX: TOTAL KNEE ARTHROPLASTY: SHX125

## 2017-09-11 LAB — TYPE AND SCREEN
ABO/RH(D): A POS
ANTIBODY SCREEN: NEGATIVE

## 2017-09-11 SURGERY — ARTHROPLASTY, KNEE, TOTAL
Anesthesia: Spinal | Site: Knee | Laterality: Left

## 2017-09-11 MED ORDER — ESCITALOPRAM OXALATE 10 MG PO TABS
10.0000 mg | ORAL_TABLET | Freq: Every day | ORAL | Status: DC
Start: 2017-09-12 — End: 2017-09-13
  Administered 2017-09-12 – 2017-09-13 (×2): 10 mg via ORAL
  Filled 2017-09-11 (×2): qty 1

## 2017-09-11 MED ORDER — STERILE WATER FOR IRRIGATION IR SOLN
Status: DC | PRN
  Administered 2017-09-11: 2000 mL

## 2017-09-11 MED ORDER — MIDAZOLAM HCL 2 MG/2ML IJ SOLN
INTRAMUSCULAR | Status: AC
  Administered 2017-09-11: 2 mg via INTRAVENOUS
  Filled 2017-09-11: qty 2

## 2017-09-11 MED ORDER — FLUTICASONE PROPIONATE 50 MCG/ACT NA SUSP
1.0000 | Freq: Every day | NASAL | Status: DC
  Administered 2017-09-12 – 2017-09-13 (×2): 1 via NASAL
  Filled 2017-09-11: qty 16

## 2017-09-11 MED ORDER — SODIUM CHLORIDE 0.9 % IV SOLN
INTRAVENOUS | Status: DC
  Administered 2017-09-11: 13:00:00 via INTRAVENOUS

## 2017-09-11 MED ORDER — POLYETHYLENE GLYCOL 3350 17 G PO PACK: 17.0000 g | PACK | Freq: Every day | ORAL | Status: DC | PRN

## 2017-09-11 MED ORDER — MIDAZOLAM HCL 2 MG/2ML IJ SOLN
1.0000 mg | INTRAMUSCULAR | Status: DC | PRN
  Administered 2017-09-11: 2 mg via INTRAVENOUS

## 2017-09-11 MED ORDER — CEFAZOLIN SODIUM-DEXTROSE 2-4 GM/100ML-% IV SOLN
INTRAVENOUS | Status: AC
  Filled 2017-09-11: qty 100

## 2017-09-11 MED ORDER — 0.9 % SODIUM CHLORIDE (POUR BTL) OPTIME
TOPICAL | Status: DC | PRN
  Administered 2017-09-11: 1000 mL

## 2017-09-11 MED ORDER — ACETAMINOPHEN 10 MG/ML IV SOLN
INTRAVENOUS | Status: AC
  Filled 2017-09-11: qty 100

## 2017-09-11 MED ORDER — CEFAZOLIN SODIUM-DEXTROSE 2-4 GM/100ML-% IV SOLN
2.0000 g | Freq: Four times a day (QID) | INTRAVENOUS | Status: AC
  Administered 2017-09-11 (×2): 2 g via INTRAVENOUS
  Filled 2017-09-11 (×2): qty 100

## 2017-09-11 MED ORDER — ACETAMINOPHEN 10 MG/ML IV SOLN
1000.0000 mg | Freq: Once | INTRAVENOUS | Status: AC
  Administered 2017-09-11: 1000 mg via INTRAVENOUS

## 2017-09-11 MED ORDER — ONDANSETRON HCL 4 MG/2ML IJ SOLN
INTRAMUSCULAR | Status: DC | PRN
  Administered 2017-09-11: 4 mg via INTRAVENOUS

## 2017-09-11 MED ORDER — PROPOFOL 500 MG/50ML IV EMUL
INTRAVENOUS | Status: DC | PRN
  Administered 2017-09-11: 50 ug/kg/min via INTRAVENOUS

## 2017-09-11 MED ORDER — METHOCARBAMOL 1000 MG/10ML IJ SOLN
500.0000 mg | Freq: Four times a day (QID) | INTRAVENOUS | Status: DC | PRN
  Filled 2017-09-11: qty 5

## 2017-09-11 MED ORDER — FENTANYL CITRATE (PF) 100 MCG/2ML IJ SOLN
50.0000 ug | INTRAMUSCULAR | Status: DC | PRN
  Administered 2017-09-11: 100 ug via INTRAVENOUS

## 2017-09-11 MED ORDER — CHLORHEXIDINE GLUCONATE 4 % EX LIQD: 60.0000 mL | Freq: Once | CUTANEOUS | Status: DC

## 2017-09-11 MED ORDER — ROSUVASTATIN CALCIUM 5 MG PO TABS
5.0000 mg | ORAL_TABLET | Freq: Every day | ORAL | Status: DC
  Administered 2017-09-11 – 2017-09-12 (×2): 5 mg via ORAL
  Filled 2017-09-11 (×2): qty 1

## 2017-09-11 MED ORDER — SODIUM CHLORIDE 0.9 % IR SOLN
Status: DC | PRN
  Administered 2017-09-11: 1000 mL

## 2017-09-11 MED ORDER — TRANEXAMIC ACID 1000 MG/10ML IV SOLN
1000.0000 mg | Freq: Once | INTRAVENOUS | Status: AC
  Administered 2017-09-11: 1000 mg via INTRAVENOUS
  Filled 2017-09-11: qty 1100
  Filled 2017-09-11: qty 10

## 2017-09-11 MED ORDER — BISACODYL 10 MG RE SUPP: 10.0000 mg | Freq: Every day | RECTAL | Status: DC | PRN

## 2017-09-11 MED ORDER — PROPOFOL 10 MG/ML IV BOLUS
INTRAVENOUS | Status: DC | PRN
Start: 2017-09-11 — End: 2017-09-11
  Administered 2017-09-11: 10 mg via INTRAVENOUS
  Administered 2017-09-11: 20 mg via INTRAVENOUS

## 2017-09-11 MED ORDER — METHOCARBAMOL 500 MG PO TABS
500.0000 mg | ORAL_TABLET | Freq: Four times a day (QID) | ORAL | Status: DC | PRN
  Administered 2017-09-11 – 2017-09-12 (×4): 500 mg via ORAL
  Filled 2017-09-11 (×4): qty 1

## 2017-09-11 MED ORDER — DOCUSATE SODIUM 100 MG PO CAPS
100.0000 mg | ORAL_CAPSULE | Freq: Two times a day (BID) | ORAL | Status: DC
  Administered 2017-09-11 – 2017-09-13 (×4): 100 mg via ORAL
  Filled 2017-09-11 (×4): qty 1

## 2017-09-11 MED ORDER — DEXAMETHASONE SODIUM PHOSPHATE 10 MG/ML IJ SOLN
INTRAMUSCULAR | Status: AC
  Filled 2017-09-11: qty 1

## 2017-09-11 MED ORDER — BUPIVACAINE IN DEXTROSE 0.75-8.25 % IT SOLN
INTRATHECAL | Status: DC | PRN
  Administered 2017-09-11: 1.8 mL via INTRATHECAL

## 2017-09-11 MED ORDER — LIDOCAINE 2% (20 MG/ML) 5 ML SYRINGE
INTRAMUSCULAR | Status: AC
  Filled 2017-09-11: qty 5

## 2017-09-11 MED ORDER — PROPOFOL 10 MG/ML IV BOLUS
INTRAVENOUS | Status: AC
  Filled 2017-09-11: qty 60

## 2017-09-11 MED ORDER — ACETAMINOPHEN 325 MG PO TABS
650.0000 mg | ORAL_TABLET | Freq: Four times a day (QID) | ORAL | Status: DC | PRN
  Administered 2017-09-12: 17:00:00 650 mg via ORAL
  Filled 2017-09-11: qty 2

## 2017-09-11 MED ORDER — ROPIVACAINE HCL 7.5 MG/ML IJ SOLN
INTRAMUSCULAR | Status: DC | PRN
  Administered 2017-09-11: 25 mL via PERINEURAL

## 2017-09-11 MED ORDER — ONDANSETRON HCL 4 MG PO TABS
4.0000 mg | ORAL_TABLET | Freq: Four times a day (QID) | ORAL | Status: DC | PRN
Start: 2017-09-11 — End: 2017-09-13

## 2017-09-11 MED ORDER — TRANEXAMIC ACID 1000 MG/10ML IV SOLN
1000.0000 mg | INTRAVENOUS | Status: AC
  Administered 2017-09-11: 1000 mg via INTRAVENOUS
  Filled 2017-09-11: qty 1100

## 2017-09-11 MED ORDER — MORPHINE SULFATE (PF) 4 MG/ML IV SOLN
1.0000 mg | INTRAVENOUS | Status: DC | PRN
  Administered 2017-09-11 – 2017-09-13 (×3): 1 mg via INTRAVENOUS
  Filled 2017-09-11 (×3): qty 1

## 2017-09-11 MED ORDER — HYDROMORPHONE HCL 2 MG PO TABS
2.0000 mg | ORAL_TABLET | ORAL | Status: DC | PRN
  Administered 2017-09-11: 4 mg via ORAL
  Administered 2017-09-11 (×2): 2 mg via ORAL
  Administered 2017-09-12 – 2017-09-13 (×9): 4 mg via ORAL
  Filled 2017-09-11 (×2): qty 1
  Filled 2017-09-11 (×10): qty 2

## 2017-09-11 MED ORDER — METOCLOPRAMIDE HCL 5 MG/ML IJ SOLN: 5.0000 mg | Freq: Three times a day (TID) | INTRAMUSCULAR | Status: DC | PRN

## 2017-09-11 MED ORDER — LACTATED RINGERS IV SOLN
INTRAVENOUS | Status: AC
  Administered 2017-09-11: 10:00:00 via INTRAVENOUS
  Administered 2017-09-11: 1000 mL via INTRAVENOUS

## 2017-09-11 MED ORDER — CYCLOSPORINE 0.05 % OP EMUL
1.0000 [drp] | Freq: Every day | OPHTHALMIC | Status: DC | PRN
  Filled 2017-09-11: qty 1

## 2017-09-11 MED ORDER — BUPIVACAINE LIPOSOME 1.3 % IJ SUSP
INTRAMUSCULAR | Status: DC | PRN
  Administered 2017-09-11 (×2): 10 mL

## 2017-09-11 MED ORDER — FENTANYL CITRATE (PF) 100 MCG/2ML IJ SOLN
INTRAMUSCULAR | Status: AC
  Administered 2017-09-11: 100 ug via INTRAVENOUS
  Filled 2017-09-11: qty 2

## 2017-09-11 MED ORDER — ONDANSETRON HCL 4 MG/2ML IJ SOLN: 4.0000 mg | Freq: Four times a day (QID) | INTRAMUSCULAR | Status: DC | PRN

## 2017-09-11 MED ORDER — BUPIVACAINE LIPOSOME 1.3 % IJ SUSP
20.0000 mL | Freq: Once | INTRAMUSCULAR | Status: DC
  Filled 2017-09-11: qty 20

## 2017-09-11 MED ORDER — AZELASTINE HCL 0.1 % NA SOLN
1.0000 | Freq: Two times a day (BID) | NASAL | Status: DC
  Filled 2017-09-11: qty 30

## 2017-09-11 MED ORDER — MENTHOL 3 MG MT LOZG: 1.0000 | LOZENGE | OROMUCOSAL | Status: DC | PRN

## 2017-09-11 MED ORDER — PANTOPRAZOLE SODIUM 40 MG PO TBEC
40.0000 mg | DELAYED_RELEASE_TABLET | Freq: Two times a day (BID) | ORAL | Status: DC
  Administered 2017-09-11 – 2017-09-12 (×2): 40 mg via ORAL
  Filled 2017-09-11 (×2): qty 1

## 2017-09-11 MED ORDER — CEFAZOLIN SODIUM-DEXTROSE 2-4 GM/100ML-% IV SOLN
2.0000 g | INTRAVENOUS | Status: AC
  Administered 2017-09-11: 2 g via INTRAVENOUS

## 2017-09-11 MED ORDER — GABAPENTIN 300 MG PO CAPS
300.0000 mg | ORAL_CAPSULE | Freq: Once | ORAL | Status: AC
  Administered 2017-09-11: 300 mg via ORAL

## 2017-09-11 MED ORDER — ZOLPIDEM TARTRATE 5 MG PO TABS
5.0000 mg | ORAL_TABLET | Freq: Every evening | ORAL | Status: DC | PRN
  Administered 2017-09-11: 22:00:00 5 mg via ORAL
  Filled 2017-09-11: qty 1

## 2017-09-11 MED ORDER — SODIUM CHLORIDE 0.9 % IJ SOLN
INTRAMUSCULAR | Status: AC
  Filled 2017-09-11: qty 50

## 2017-09-11 MED ORDER — DEXAMETHASONE SODIUM PHOSPHATE 10 MG/ML IJ SOLN
10.0000 mg | Freq: Once | INTRAMUSCULAR | Status: AC
  Administered 2017-09-11: 10 mg via INTRAVENOUS

## 2017-09-11 MED ORDER — ACETAMINOPHEN 650 MG RE SUPP: 650.0000 mg | Freq: Four times a day (QID) | RECTAL | Status: DC | PRN

## 2017-09-11 MED ORDER — ACETAMINOPHEN 500 MG PO TABS
1000.0000 mg | ORAL_TABLET | Freq: Four times a day (QID) | ORAL | Status: AC
  Administered 2017-09-11 – 2017-09-12 (×4): 1000 mg via ORAL
  Filled 2017-09-11 (×4): qty 2

## 2017-09-11 MED ORDER — METOCLOPRAMIDE HCL 5 MG PO TABS: 5.0000 mg | ORAL_TABLET | Freq: Three times a day (TID) | ORAL | Status: DC | PRN

## 2017-09-11 MED ORDER — LEVOTHYROXINE SODIUM 50 MCG PO TABS
50.0000 ug | ORAL_TABLET | Freq: Every day | ORAL | Status: DC
  Administered 2017-09-11 – 2017-09-12 (×2): 50 ug via ORAL
  Filled 2017-09-11 (×2): qty 1

## 2017-09-11 MED ORDER — SODIUM CHLORIDE 0.9 % IJ SOLN
INTRAMUSCULAR | Status: AC
  Filled 2017-09-11: qty 10

## 2017-09-11 MED ORDER — LIDOCAINE 2% (20 MG/ML) 5 ML SYRINGE
INTRAMUSCULAR | Status: DC | PRN
  Administered 2017-09-11: 40 mg via INTRAVENOUS

## 2017-09-11 MED ORDER — FLEET ENEMA 7-19 GM/118ML RE ENEM: 1.0000 | ENEMA | Freq: Once | RECTAL | Status: DC | PRN

## 2017-09-11 MED ORDER — DEXAMETHASONE SODIUM PHOSPHATE 10 MG/ML IJ SOLN
10.0000 mg | Freq: Once | INTRAMUSCULAR | Status: AC
  Administered 2017-09-12: 10 mg via INTRAVENOUS
  Filled 2017-09-11: qty 1

## 2017-09-11 MED ORDER — EZETIMIBE 10 MG PO TABS
10.0000 mg | ORAL_TABLET | Freq: Every day | ORAL | Status: DC
  Administered 2017-09-11 – 2017-09-12 (×2): 10 mg via ORAL
  Filled 2017-09-11 (×2): qty 1

## 2017-09-11 MED ORDER — SODIUM CHLORIDE 0.9 % IJ SOLN
INTRAMUSCULAR | Status: DC | PRN
  Administered 2017-09-11 (×2): 30 mL

## 2017-09-11 MED ORDER — ZOLPIDEM TARTRATE 5 MG PO TABS
5.0000 mg | ORAL_TABLET | Freq: Every evening | ORAL | Status: DC | PRN
  Administered 2017-09-12: 22:00:00 5 mg via ORAL
  Filled 2017-09-11 (×2): qty 1

## 2017-09-11 MED ORDER — LACTATED RINGERS IV SOLN: INTRAVENOUS | Status: DC

## 2017-09-11 MED ORDER — AZELASTINE HCL 0.1 % NA SOLN
1.0000 | Freq: Two times a day (BID) | NASAL | Status: DC
  Administered 2017-09-12 – 2017-09-13 (×2): 1 via NASAL
  Filled 2017-09-11: qty 30

## 2017-09-11 MED ORDER — RIVAROXABAN 10 MG PO TABS
10.0000 mg | ORAL_TABLET | Freq: Every day | ORAL | Status: DC
  Administered 2017-09-12 – 2017-09-13 (×2): 10 mg via ORAL
  Filled 2017-09-11 (×2): qty 1

## 2017-09-11 MED ORDER — PHENOL 1.4 % MT LIQD: 1.0000 | OROMUCOSAL | Status: DC | PRN

## 2017-09-11 MED ORDER — GABAPENTIN 300 MG PO CAPS
ORAL_CAPSULE | ORAL | Status: AC
  Administered 2017-09-11: 300 mg via ORAL
  Filled 2017-09-11: qty 1

## 2017-09-11 MED ORDER — ONDANSETRON HCL 4 MG/2ML IJ SOLN
INTRAMUSCULAR | Status: AC
  Filled 2017-09-11: qty 2

## 2017-09-11 MED ORDER — DIPHENHYDRAMINE HCL 12.5 MG/5ML PO ELIX: 12.5000 mg | ORAL_SOLUTION | ORAL | Status: DC | PRN

## 2017-09-11 MED ORDER — BUPIVACAINE HCL (PF) 0.25 % IJ SOLN
INTRAMUSCULAR | Status: AC
  Filled 2017-09-11: qty 30

## 2017-09-11 SURGICAL SUPPLY — 51 items
BAG DECANTER FOR FLEXI CONT (MISCELLANEOUS) ×1 IMPLANT
BAG SPEC THK2 15X12 ZIP CLS (MISCELLANEOUS) ×1
BAG ZIPLOCK 12X15 (MISCELLANEOUS) ×3 IMPLANT
BANDAGE ACE 6X5 VEL STRL LF (GAUZE/BANDAGES/DRESSINGS) ×3 IMPLANT
BLADE SAG 18X100X1.27 (BLADE) ×3 IMPLANT
BLADE SAW SGTL 11.0X1.19X90.0M (BLADE) ×3 IMPLANT
BOWL SMART MIX CTS (DISPOSABLE) ×3 IMPLANT
CAPT KNEE TOTAL 3 ATTUNE ×2 IMPLANT
CEMENT HV SMART SET (Cement) ×6 IMPLANT
CLOSURE WOUND 1/2 X4 (GAUZE/BANDAGES/DRESSINGS) ×2
COVER SURGICAL LIGHT HANDLE (MISCELLANEOUS) ×3 IMPLANT
CUFF TOURN SGL QUICK 34 (TOURNIQUET CUFF) ×3
CUFF TRNQT CYL 34X4X40X1 (TOURNIQUET CUFF) ×1 IMPLANT
DECANTER SPIKE VIAL GLASS SM (MISCELLANEOUS) ×3 IMPLANT
DRAPE U-SHAPE 47X51 STRL (DRAPES) ×3 IMPLANT
DRSG ADAPTIC 3X8 NADH LF (GAUZE/BANDAGES/DRESSINGS) ×3 IMPLANT
DRSG PAD ABDOMINAL 8X10 ST (GAUZE/BANDAGES/DRESSINGS) ×3 IMPLANT
DURAPREP 26ML APPLICATOR (WOUND CARE) ×3 IMPLANT
ELECT REM PT RETURN 15FT ADLT (MISCELLANEOUS) ×3 IMPLANT
EVACUATOR 1/8 PVC DRAIN (DRAIN) ×3 IMPLANT
GAUZE SPONGE 4X4 12PLY STRL (GAUZE/BANDAGES/DRESSINGS) ×3 IMPLANT
GLOVE BIO SURGEON STRL SZ7.5 (GLOVE) IMPLANT
GLOVE BIO SURGEON STRL SZ8 (GLOVE) ×3 IMPLANT
GLOVE BIOGEL PI IND STRL 6.5 (GLOVE) IMPLANT
GLOVE BIOGEL PI IND STRL 8 (GLOVE) ×1 IMPLANT
GLOVE BIOGEL PI INDICATOR 6.5 (GLOVE) ×2
GLOVE BIOGEL PI INDICATOR 8 (GLOVE) ×2
GLOVE SURG SS PI 6.5 STRL IVOR (GLOVE) ×2 IMPLANT
GOWN STRL REUS W/TWL LRG LVL3 (GOWN DISPOSABLE) ×5 IMPLANT
GOWN STRL REUS W/TWL XL LVL3 (GOWN DISPOSABLE) IMPLANT
HANDPIECE INTERPULSE COAX TIP (DISPOSABLE) ×3
IMMOBILIZER KNEE 20 (SOFTGOODS) ×3
IMMOBILIZER KNEE 20 THIGH 36 (SOFTGOODS) ×1 IMPLANT
MANIFOLD NEPTUNE II (INSTRUMENTS) ×3 IMPLANT
NS IRRIG 1000ML POUR BTL (IV SOLUTION) ×3 IMPLANT
PACK TOTAL KNEE CUSTOM (KITS) ×3 IMPLANT
PADDING CAST COTTON 6X4 STRL (CAST SUPPLIES) ×7 IMPLANT
POSITIONER SURGICAL ARM (MISCELLANEOUS) ×3 IMPLANT
SET HNDPC FAN SPRY TIP SCT (DISPOSABLE) ×1 IMPLANT
STRIP CLOSURE SKIN 1/2X4 (GAUZE/BANDAGES/DRESSINGS) ×4 IMPLANT
SUT MNCRL AB 4-0 PS2 18 (SUTURE) ×3 IMPLANT
SUT STRATAFIX 0 PDS 27 VIOLET (SUTURE) ×3
SUT VIC AB 2-0 CT1 27 (SUTURE) ×9
SUT VIC AB 2-0 CT1 TAPERPNT 27 (SUTURE) ×3 IMPLANT
SUTURE STRATFX 0 PDS 27 VIOLET (SUTURE) ×1 IMPLANT
SYR 30ML LL (SYRINGE) ×6 IMPLANT
TRAY FOLEY CATH 14FRSI W/METER (CATHETERS) ×2 IMPLANT
TRAY FOLEY W/METER SILVER 16FR (SET/KITS/TRAYS/PACK) IMPLANT
WATER STERILE IRR 1000ML POUR (IV SOLUTION) ×6 IMPLANT
WRAP KNEE MAXI GEL POST OP (GAUZE/BANDAGES/DRESSINGS) ×3 IMPLANT
YANKAUER SUCT BULB TIP 10FT TU (MISCELLANEOUS) ×3 IMPLANT

## 2017-09-11 NOTE — Anesthesia Procedure Notes (Addendum)
Spinal  Patient location during procedure: OR Start time: 09/11/2017 9:16 AM End time: 09/11/2017 9:20 AM Staffing Anesthesiologist: Janeece Riggers Resident/CRNA: Darlys Gales R Performed: resident/CRNA  Preanesthetic Checklist Completed: patient identified, site marked, surgical consent, pre-op evaluation, timeout performed, IV checked, risks and benefits discussed and monitors and equipment checked Spinal Block Patient position: sitting Prep: DuraPrep Patient monitoring: heart rate, cardiac monitor, continuous pulse ox and blood pressure Approach: midline Location: L3-4 Injection technique: single-shot Needle Needle type: Pencan  Needle gauge: 24 G Needle length: 10 cm Needle insertion depth: 7 cm Assessment Sensory level: T6 Additional Notes Timeout performed, sterile prep and drape, attempt x1 negative heme, parasthesia, positive CSF pre and post injection. VSS

## 2017-09-11 NOTE — Anesthesia Procedure Notes (Signed)
Anesthesia Regional Block: Adductor canal block   Pre-Anesthetic Checklist: ,, timeout performed, Correct Patient, Correct Site, Correct Laterality, Correct Procedure, Correct Position, site marked, Risks and benefits discussed,  Surgical consent,  Pre-op evaluation,  At surgeon's request and post-op pain management  Laterality: Left  Prep: chloraprep       Needles:  Injection technique: Single-shot  Needle Type: Echogenic Stimulator Needle     Needle Length: 5cm  Needle Gauge: 22     Additional Needles:   Procedures:, nerve stimulator,,, ultrasound used (permanent image in chart),,,,  Narrative:  Start time: 09/11/2017 8:23 AM End time: 09/11/2017 8:29 AM Injection made incrementally with aspirations every 5 mL.  Performed by: Personally  Anesthesiologist: Zulay Corrie  Additional Notes: Functioning IV was confirmed and monitors were applied.  A 35mm 22ga Arrow echogenic stimulator needle was used. Sterile prep and drape,hand hygiene and sterile gloves were used. Ultrasound guidance: relevant anatomy identified, needle position confirmed, local anesthetic spread visualized around nerve(s)., vascular puncture avoided.  Image printed for medical record. Negative aspiration and negative test dose prior to incremental administration of local anesthetic. The patient tolerated the procedure well.

## 2017-09-11 NOTE — Progress Notes (Signed)
Assisted Dr. Oddono with left, ultrasound guided, adductor canal block. Side rails up, monitors on throughout procedure. See vital signs in flow sheet. Tolerated Procedure well.  

## 2017-09-11 NOTE — H&P (View-Only) (Signed)
[name] DOB: [birth year] Married / Language: English / Race: White Female Date of Admission:  09/11/2017 CC:  Left Knee Pain History of Present Illness The patient is a 71 year old female who comes in for a preoperative History and Physical. The patient is scheduled for a left total knee arthroplasty to be performed by Dr. Dione Plover. Aluisio, MD at [facility] on 09/11/2017. The patient is a 71 year old female who presented for follow up of their knee. The patient is being followed for their bilateral knee pain and osteoarthritis. They are now month(s) out from last visit and left knee cortisone injection. Symptoms reported include: pain. The patient feels that they are doing poorly. The following medication has been used for pain control: antiinflammatory medication (Aleve sometimes). Patient states that the injection lasted about three months. She said that her left knee is worse than the right. She states his LEFT knee is getting progressively worse. Cortisone injection barely helped this past time. The cortisone did help the RIGHT knee though. She has had a stage of the LEFT knee is hurting all the time and is limiting what she can and cannot do. She is ready to go ahead and get this knee fixed. The injections are no longer beneficial in the LEFT. She has had cortisone and Visco supplements in that knee in the past. She is ready to get the left knee fixed. They have been treated conservatively in the past for the above stated problem and despite conservative measures, they continue to have progressive pain and severe functional limitations and dysfunction. They have failed non-operative management including home exercise, medications, and injections. It is felt that they would benefit from undergoing total joint replacement. Risks and benefits of the procedure have been discussed with the patient and they elect to proceed with surgery. There are no active contraindications to  surgery such as ongoing infection or rapidly progressive neurological disease.   Problem List/Past Medical  Iliotibial band tendinitis of right side (M76.31)  Chondromalacia of patella (M22.40)  Primary osteoarthritis of right knee (M17.11)  Right knee pain (M25.561)  Hiatal Hernia  Gastroesophageal Reflux Disease  Skin Cancer  Tinnitus  Vertigo  Shingles  Bronchitis  Pneumonia  Kidney Stone  Cystitis  Measles  Mumps  Menopause  Breast disease   Allergies Codeine Derivatives  Nausea. Codeine/Codeine Derivatives   Family History  Chronic Obstructive Lung Disease  Father. father Cancer  First Degree Relatives. mother Father  Deceased. age 89 Mother  Deceased. age 72  Social History Number of flights of stairs before winded  2-3 Marital status  married Living situation  live with spouse Tobacco use  Never smoker. never smoker Tobacco / smoke exposure  no Pain Contract  no Illicit drug use  no Current work status  retired Children  2 Alcohol use  former drinker Exercise  Exercises weekly; does running / walking Drug/Alcohol Rehab (Previously)  no Drug/Alcohol Rehab (Currently)  no Post-Surgical Plans  Home With Family, Home, Straight to Outpatient Therapy. Catering manager.  Medication History Crestor (Oral) Specific strength unknown - Active. Zetia (10MG  Tablet, Oral) Active. Synthroid (Oral) Specific strength unknown - Active. Lexapro (Oral) Specific strength unknown - Active. NexIUM (Oral) Specific strength unknown - Active. Aspirin (81MG  Tablet Chewable, Oral) Active. Saline Nasal Spray Active. Fluticasone Propionate (Inhal) (50MCG/BLIST Powder, Inhalation) Active. Azelastine HCl (137MCG/SPRAY Solution, Nasal) Active. vitamin D3 Active. Probiotic Active. Magnesium Active. Aleve (220MG  Tablet, Oral) Active. Mega Red Fish  Oil Active. Super B Complex Active.   Past  Surgical History Tonsillectomy  Gallbladder Surgery  laporoscopic Sinus Surgery  Straighten Nasal Septum     Review of Systems  General Not Present- Chills, Fatigue, Fever, Memory Loss, Night Sweats, Weight Gain and Weight Loss. Skin Present- Excessive Sweating. Not Present- Eczema, Hives, Itching, Lesions and Rash. HEENT Present- Tinnitus. Not Present- Dentures, Double Vision, Headache, Hearing Loss and Visual Loss. Respiratory Present- Shortness of breath with exertion and Snoring. Not Present- Allergies, Chronic Cough, Coughing up blood and Shortness of breath at rest. Cardiovascular Not Present- Chest Pain, Difficulty Breathing Lying Down, Murmur, Palpitations, Racing/skipping heartbeats and Swelling. Gastrointestinal Present- Abdominal Pain, Constipation, Indigestion and Vomiting. Not Present- Bloody Stool, Diarrhea, Difficulty Swallowing, Heartburn, Jaundice, Loss of appetitie and Nausea. Female Genitourinary Not Present- Blood in Urine, Discharge, Flank Pain, Incontinence, Painful Urination, Urgency, Urinary frequency, Urinary Retention, Urinating at Night and Weak urinary stream. Musculoskeletal Present- Joint Pain. Not Present- Back Pain, Joint Swelling, Morning Stiffness, Muscle Pain, Muscle Weakness and Spasms. Neurological Present- Dizziness. Not Present- Blackout spells, Difficulty with balance, Paralysis, Tremor and Weakness. Psychiatric Not Present- Insomnia. Endocrine Present- Hair Changes and Heat Intolerance.  Vitals  Weight: 175 lb Height: 68in Weight was reported by patient. Height was reported by patient. Body Surface Area: 1.93 m Body Mass Index: 26.61 kg/m  Pulse: 72 (Regular)  BP: 138/88 (Sitting, Right Arm, Standard)   Physical Exam General Mental Status -Alert, cooperative and good historian. General Appearance-pleasant, Not in acute distress. Orientation-Oriented X3. Build & Nutrition-Well nourished and Well developed.  Head and  Neck Head-normocephalic, atraumatic . Neck Global Assessment - supple, no bruit auscultated on the right, no bruit auscultated on the left.  Eye Vision-Wears corrective lenses. Pupil - Bilateral-Regular and Round. Motion - Bilateral-EOMI.  Chest and Lung Exam Auscultation Breath sounds - clear at anterior chest wall and clear at posterior chest wall. Adventitious sounds - No Adventitious sounds.  Cardiovascular Auscultation Rhythm - Regular rate and rhythm. Heart Sounds - S1 WNL and S2 WNL. Murmurs & Other Heart Sounds - Auscultation of the heart reveals - No Murmurs.  Abdomen Palpation/Percussion Tenderness - Abdomen is non-tender to palpation. Rigidity (guarding) - Abdomen is soft. Auscultation Auscultation of the abdomen reveals - Bowel sounds normal.  Female Genitourinary Note: Not done, not pertinent to present illness   Musculoskeletal Note: Her RIGHT knee shows no effusion. Range of motion is 0-135. There is moderate crepitus on range of motion but no tenderness or instability. LEFT knee no effusion range 5-125 moderate crepitus on range of motion with tenderness medial and lateral no instability noted. She has a significant antalgic gait pattern on the LEFT. New radiographs taken today AP and lateral LEFT knee and RIGHT knee show that there is no change in the appearance of the RIGHT knee. LEFT knee and does have progressively worsening patellofemoral and medial disease compared to about a year ago.     Assessment & Plan  Primary osteoarthritis of left knee (M17.12)  Note:Surgical Plans: Left Total Knee Replacement  Disposition: Home with husband, Straight to outpatient at [place]  PCP: Dr. Virgina Jock - Patient has been seen preoperatively and felt to be stable for surgery. Cards: Dr. Stanford Breed  IV TXA  Anesthesia Issues: None except for an aspiration pneumonia following sedation for a colonscopy  Patient was instructed on what medications to stop prior  to surgery.  Signed electronically by Joelene Millin, III PA-C

## 2017-09-11 NOTE — Discharge Instructions (Signed)
Information on my medicine - XARELTO (Rivaroxaban)  This medication education was reviewed with me or my healthcare representative as part of my discharge preparation.  The pharmacist that spoke with me during my hospital stay was:  Kara Mead, [facility]  Why was Xarelto prescribed for you? Xarelto was prescribed for you to reduce the risk of blood clots forming after orthopedic surgery. The medical term for these abnormal blood clots is venous thromboembolism (VTE).  What do you need to know about xarelto ? Take your Xarelto ONCE DAILY at the same time every day. You may take it either with or without food.  If you have difficulty swallowing the tablet whole, you may crush it and mix in applesauce just prior to taking your dose.  Take Xarelto exactly as prescribed by your doctor and DO NOT stop taking Xarelto without talking to the doctor who prescribed the medication.  Stopping without other VTE prevention medication to take the place of Xarelto may increase your risk of developing a clot.  After discharge, you should have regular check-up appointments with your healthcare provider that is prescribing your Xarelto.    What do you do if you miss a dose? If you miss a dose, take it as soon as you remember on the same day then continue your regularly scheduled once daily regimen the next day. Do not take two doses of Xarelto on the same day.   Important Safety Information A possible side effect of Xarelto is bleeding. You should call your healthcare provider right away if you experience any of the following: ? Bleeding from an injury or your nose that does not stop. ? Unusual colored urine (red or dark brown) or unusual colored stools (red or black). ? Unusual bruising for unknown reasons. ? A serious fall or if you hit your head (even if there is no bleeding).  Some medicines may interact with Xarelto and might increase your risk of bleeding while on Xarelto. To help  avoid this, consult your healthcare provider or pharmacist prior to using any new prescription or non-prescription medications, including herbals, vitamins, non-steroidal anti-inflammatory drugs (NSAIDs) and supplements.  This website has more information on Xarelto: [email or web address]

## 2017-09-11 NOTE — Op Note (Signed)
OPERATIVE REPORT-TOTAL KNEE ARTHROPLASTY   Pre-operative diagnosis- Osteoarthritis  Left knee(s)  Post-operative diagnosis- Osteoarthritis Left knee(s)  Procedure-  Left  Total Knee Arthroplasty  Surgeon- Dione Plover. Primrose Oler, MD  Assistant- Ardeen Jourdain, PA-C   Anesthesia-  Adductor canal block and spinal  EBL-* No blood loss amount entered *   Drains Hemovac  Tourniquet time- 28 minutes @ 623 mm Hg    Complications- None  Condition-PACU - hemodynamically stable.   Brief Clinical Note  [name] is a 71 y.o. year old female with end stage OA of her left knee with progressively worsening pain and dysfunction. She has constant pain, with activity and at rest and significant functional deficits with difficulties even with ADLs. She has had extensive non-op management including analgesics, injections of cortisone and viscosupplements, and home exercise program, but remains in significant pain with significant dysfunction. Radiographs show bone on bone arthritis patellofemoral with lateral narrowing. She presents now for left Total Knee Arthroplasty.    Procedure in detail---   The patient is brought into the operating room and positioned supine on the operating table. After successful administration of  Adductor canal block and spinal,   a tourniquet is placed high on the  Left thigh(s) and the lower extremity is prepped and draped in the usual sterile fashion. Time out is performed by the operating team and then the  Left lower extremity is wrapped in Esmarch, knee flexed and the tourniquet inflated to 300 mmHg.       A midline incision is made with a ten blade through the subcutaneous tissue to the level of the extensor mechanism. A fresh blade is used to make a medial parapatellar arthrotomy. Soft tissue over the proximal medial tibia is subperiosteally elevated to the joint line with a knife and into the semimembranosus bursa with a Cobb elevator. Soft tissue over the  proximal lateral tibia is elevated with attention being paid to avoiding the patellar tendon on the tibial tubercle. The patella is everted, knee flexed 90 degrees and the ACL and PCL are removed. Findings are bone on bone patellofemoral with exposed bone lateral.        The drill is used to create a starting hole in the distal femur and the canal is thoroughly irrigated with sterile saline to remove the fatty contents. The 5 degree Left  valgus alignment guide is placed into the femoral canal and the distal femoral cutting block is pinned to remove 9 mm off the distal femur. Resection is made with an oscillating saw.      The tibia is subluxed forward and the menisci are removed. The extramedullary alignment guide is placed referencing proximally at the medial aspect of the tibial tubercle and distally along the second metatarsal axis and tibial crest. The block is pinned to remove 46mm off the more deficient lateral  side. Resection is made with an oscillating saw. Size 4is the most appropriate size for the tibia and the proximal tibia is prepared with the modular drill and keel punch for that size.      The femoral sizing guide is placed and size 5 is most appropriate. Rotation is marked off the epicondylar axis and confirmed by creating a rectangular flexion gap at 90 degrees. The size 5 cutting block is pinned in this rotation and the anterior, posterior and chamfer cuts are made with the oscillating saw. The intercondylar block is then placed and that cut is made.      Trial size 4  tibial component, trial size 5 narrow posterior stabilized femur and a 8  mm posterior stabilized rotating platform insert trial is placed. Full extension is achieved with excellent varus/valgus and anterior/posterior balance throughout full range of motion. The patella is everted and thickness measured to be 21  mm. Free hand resection is taken to 12 mm, a 35 template is placed, lug holes are drilled, trial patella is placed,  and it tracks normally. Osteophytes are removed off the posterior femur with the trial in place. All trials are removed and the cut bone surfaces prepared with pulsatile lavage. Cement is mixed and once ready for implantation, the size 4 tibial implant, size  5 narrow posterior stabilized femoral component, and the size 35 patella are cemented in place and the patella is held with the clamp. The trial insert is placed and the knee held in full extension. The Exparel (20 ml mixed with 60 ml saline) is injected into the extensor mechanism, posterior capsule, medial and lateral gutters and subcutaneous tissues.  All extruded cement is removed and once the cement is hard the permanent 8 mm posterior stabilized rotating platform insert is placed into the tibial tray.      The wound is copiously irrigated with saline solution and the extensor mechanism closed over a hemovac drain with #1 V-loc suture. The tourniquet is released for a total tourniquet time of 28  minutes. Flexion against gravity is 140 degrees and the patella tracks normally. Subcutaneous tissue is closed with 2.0 vicryl and subcuticular with running 4.0 Monocryl. The incision is cleaned and dried and steri-strips and a bulky sterile dressing are applied. The limb is placed into a knee immobilizer and the patient is awakened and transported to recovery in stable condition.      Please note that a surgical assistant was a medical necessity for this procedure in order to perform it in a safe and expeditious manner. Surgical assistant was necessary to retract the ligaments and vital neurovascular structures to prevent injury to them and also necessary for proper positioning of the limb to allow for anatomic placement of the prosthesis.   Dione Plover Kamali Nephew, MD    09/11/2017, 10:14 AM

## 2017-09-11 NOTE — Anesthesia Postprocedure Evaluation (Signed)
Anesthesia Post Note  Patient: [name]  Procedure(s) Performed: LEFT TOTAL KNEE ARTHROPLASTY (Left Knee)     Patient location during evaluation: PACU Anesthesia Type: Spinal Level of consciousness: oriented and awake and alert Pain management: pain level controlled Vital Signs Assessment: post-procedure vital signs reviewed and stable Respiratory status: spontaneous breathing, respiratory function stable and patient connected to nasal cannula oxygen Cardiovascular status: blood pressure returned to baseline and stable Postop Assessment: no headache, no backache and no apparent nausea or vomiting Anesthetic complications: no    Last Vitals:  Vitals:   09/11/17 1353 09/11/17 1445  BP: 128/74 (!) 150/78  Pulse: 85 94  Resp: 13 13  Temp: 36.7 C 36.6 C  SpO2: 96% 93%    Last Pain:  Vitals:   09/11/17 1445  TempSrc: Oral  PainSc:                  [name]

## 2017-09-11 NOTE — Transfer of Care (Signed)
Immediate Anesthesia Transfer of Care Note  Patient: [name]  Procedure(s) Performed: LEFT TOTAL KNEE ARTHROPLASTY (Left Knee)  Patient Location: PACU  Anesthesia Type:Spinal  Level of Consciousness: awake and alert   Airway & Oxygen Therapy: Patient Spontanous Breathing and Patient connected to face mask oxygen  Post-op Assessment: Report given to RN and Post -op Vital signs reviewed and stable  Post vital signs: Reviewed and stable  Last Vitals:  Vitals:   09/11/17 0854 09/11/17 0855  BP:    Pulse: 82 86  Resp: 13 12  Temp:    SpO2: 95% 94%    Last Pain:  Vitals:   09/11/17 0707  TempSrc: Oral      Patients Stated Pain Goal: 4 (25/95/63 8756)  Complications: No apparent anesthesia complications

## 2017-09-11 NOTE — Evaluation (Signed)
Physical Therapy Evaluation Patient Details Name: [name] MRN: [id number] DOB: December 29, 1945 Today's Date: 09/11/2017   History of Present Illness  L TKA,  Clinical Impression  The patient tolerated ambulating  X 15 feet with RW, Plans to go to OPPT at [place].  The patient felt decrease in [pain once mobilizing a  Little today. She expresses concern for developing scar tissue.Pt admitted with above diagnosis. Pt currently with functional limitations due to the deficits listed below (see PT Problem List).  Pt will benefit from skilled PT to increase their independence and safety with mobility to allow discharge to the venue listed below.  ,    Follow Up Recommendations Outpatient PT;DC plan and follow up therapy as arranged by surgeon    Equipment Recommendations  3in1 (PT)    Recommendations for Other Services       Precautions / Restrictions Precautions Precautions: Knee Required Braces or Orthoses: Knee Immobilizer - Left      Mobility  Bed Mobility Overal bed mobility: Needs Assistance Bed Mobility: Supine to Sit     Supine to sit: Mod assist;HOB elevated     General bed mobility comments: use of  sheet around foot, assist with trunk and left leg.  Transfers Overall transfer level: Needs assistance Equipment used: Rolling walker (2 wheeled) Transfers: Sit to/from Stand Sit to Stand: Mod assist;From elevated surface         General transfer comment: cues for hand placement, left leg position  Ambulation/Gait Ambulation/Gait assistance: Mod assist Ambulation Distance (Feet): 15 Feet Assistive device: Rolling walker (2 wheeled) Gait Pattern/deviations: Step-to pattern;Decreased step length - left;Decreased stance time - left     General Gait Details: cues for sequence  and posture, tends to look down, noted tremors of the trunkand arms at times  Stairs            Wheelchair Mobility    Modified Rankin (Stroke Patients Only)       Balance                                             Pertinent Vitals/Pain Pain Assessment: 0-10 Pain Score: 5  Pain Location: left knee Pain Descriptors / Indicators: Aching;Sore;Discomfort;Grimacing Pain Intervention(s): Limited activity within patient's tolerance;Monitored during session;Premedicated before session;Repositioned;Ice applied    Home Living Family/patient expects to be discharged to:: Private residence Living Arrangements: Spouse/significant other Available Help at Discharge: Family Type of Home: House Home Access: Stairs to enter Entrance Stairs-Rails:  (rail in center) Technical brewer of Steps: 3 Home Layout: Two level;Able to live on main level with bedroom/bathroom Home Equipment: Gilford Rile - 2 wheels;Tub bench      Prior Function                 Hand Dominance        Extremity/Trunk Assessment   Upper Extremity Assessment Upper Extremity Assessment: Defer to OT evaluation    Lower Extremity Assessment Lower Extremity Assessment: LLE deficits/detail LLE Deficits / Details: assist for SLR    Cervical / Trunk Assessment Cervical / Trunk Assessment: Other exceptions Cervical / Trunk Exceptions: noted essentail tremors of head  Communication      Cognition Arousal/Alertness: Awake/alert Behavior During Therapy: WFL for tasks assessed/performed Overall Cognitive Status: Within Functional Limits for tasks assessed  General Comments      Exercises     Assessment/Plan    PT Assessment Patient needs continued PT services  PT Problem List Decreased strength;Decreased range of motion;Decreased activity tolerance;Decreased balance;Decreased mobility;Decreased knowledge of precautions;Decreased safety awareness;Decreased knowledge of use of DME;Pain       PT Treatment Interventions DME instruction;Gait training;Stair training;Functional mobility training;Therapeutic  activities;Therapeutic exercise;Patient/family education    PT Goals (Current goals can be found in the Care Plan section)  Acute Rehab PT Goals Patient Stated Goal: to not get scar tissue PT Goal Formulation: With patient/family Time For Goal Achievement: 09/16/17 Potential to Achieve Goals: Good    Frequency 7X/week   Barriers to discharge        Co-evaluation               AM-PAC PT "6 Clicks" Daily Activity  Outcome Measure Difficulty turning over in bed (including adjusting bedclothes, sheets and blankets)?: Unable Difficulty moving from lying on back to sitting on the side of the bed? : Unable Difficulty sitting down on and standing up from a chair with arms (e.g., wheelchair, bedside commode, etc,.)?: Unable Help needed moving to and from a bed to chair (including a wheelchair)?: Total Help needed walking in hospital room?: Total Help needed climbing 3-5 steps with a railing? : Total 6 Click Score: 6    End of Session Equipment Utilized During Treatment: Left knee immobilizer Activity Tolerance: Patient tolerated treatment well Patient left: in chair;with call bell/phone within reach;with family/visitor present Nurse Communication: Mobility status      Time: 1706-1740 PT Time Calculation (min) (ACUTE ONLY): 34 min   Charges:   PT Evaluation $PT Eval Low Complexity: 1 Low PT Treatments $Gait Training: 8-22 mins   PT G CodesTresa Charles PT [phone]  [name], [name] 09/11/2017, 5:55 PM

## 2017-09-11 NOTE — Interval H&P Note (Signed)
History and Physical Interval Note:  09/11/2017 7:02 AM  [name]  has presented today for surgery, with the diagnosis of Osteoarthritis Left Knee  The various methods of treatment have been discussed with the patient and family. After consideration of risks, benefits and other options for treatment, the patient has consented to  Procedure(s): LEFT TOTAL KNEE ARTHROPLASTY (Left) as a surgical intervention .  The patient's history has been reviewed, patient examined, no change in status, stable for surgery.  I have reviewed the patient's chart and labs.  Questions were answered to the patient's satisfaction.     Gearlean Alf

## 2017-09-12 LAB — CBC
HEMATOCRIT: 36.7 % (ref 36.0–46.0)
Hemoglobin: 11.7 g/dL — ABNORMAL LOW (ref 12.0–15.0)
MCH: 28.9 pg (ref 26.0–34.0)
MCHC: 31.9 g/dL (ref 30.0–36.0)
MCV: 90.6 fL (ref 78.0–100.0)
PLATELETS: 282 10*3/uL (ref 150–400)
RBC: 4.05 MIL/uL (ref 3.87–5.11)
RDW: 14.3 % (ref 11.5–15.5)
WBC: 22 10*3/uL — AB (ref 4.0–10.5)

## 2017-09-12 LAB — BASIC METABOLIC PANEL
ANION GAP: 6 (ref 5–15)
BUN: 8 mg/dL (ref 6–20)
CALCIUM: 9.2 mg/dL (ref 8.9–10.3)
CO2: 27 mmol/L (ref 22–32)
CREATININE: 0.75 mg/dL (ref 0.44–1.00)
Chloride: 108 mmol/L (ref 101–111)
GLUCOSE: 151 mg/dL — AB (ref 65–99)
Potassium: 4.6 mmol/L (ref 3.5–5.1)
Sodium: 141 mmol/L (ref 135–145)

## 2017-09-12 MED ORDER — CYCLOBENZAPRINE HCL 5 MG PO TABS: 5.0000 mg | ORAL_TABLET | Freq: Three times a day (TID) | ORAL | 0 refills | Status: DC | PRN

## 2017-09-12 MED ORDER — CYCLOBENZAPRINE HCL 5 MG PO TABS
5.0000 mg | ORAL_TABLET | Freq: Three times a day (TID) | ORAL | Status: DC | PRN
  Administered 2017-09-12 – 2017-09-13 (×3): 5 mg via ORAL
  Filled 2017-09-12 (×3): qty 1

## 2017-09-12 MED ORDER — ALUM & MAG HYDROXIDE-SIMETH 200-200-20 MG/5ML PO SUSP
30.0000 mL | ORAL | Status: DC | PRN
Start: 2017-09-12 — End: 2017-09-13
  Administered 2017-09-12: 30 mL via ORAL
  Filled 2017-09-12: qty 30

## 2017-09-12 MED ORDER — HYDROMORPHONE HCL 2 MG PO TABS: 2.0000 mg | ORAL_TABLET | ORAL | 0 refills | Status: DC | PRN

## 2017-09-12 MED ORDER — SALINE SPRAY 0.65 % NA SOLN
1.0000 | NASAL | Status: DC | PRN
  Filled 2017-09-12: qty 44

## 2017-09-12 MED ORDER — CALCIUM CARBONATE ANTACID 500 MG PO CHEW
2.0000 | CHEWABLE_TABLET | ORAL | Status: DC | PRN
Start: 2017-09-12 — End: 2017-09-13

## 2017-09-12 MED ORDER — ESOMEPRAZOLE MAGNESIUM 40 MG PO CPDR
40.0000 mg | DELAYED_RELEASE_CAPSULE | Freq: Every day | ORAL | Status: DC
  Administered 2017-09-13: 40 mg via ORAL
  Filled 2017-09-12 (×2): qty 1

## 2017-09-12 MED ORDER — RIVAROXABAN 10 MG PO TABS: 10.0000 mg | ORAL_TABLET | Freq: Every day | ORAL | 0 refills | Status: DC

## 2017-09-12 NOTE — Progress Notes (Signed)
Physical Therapy Treatment Patient Details Name: [name] MRN: [id number] DOB: [birth year] Today's Date: 09/12/2017    History of Present Illness L TKA,    PT Comments    The patient  began step instruction, unsteady gait at times. Tends to look down. Steady assist for gait. Plans Dc tomorrow.   Follow Up Recommendations   OPPT     Equipment Recommendations    none   Recommendations for Other Services       Precautions / Restrictions Precautions Precautions: Knee, fall Required Braces or Orthoses: Knee Immobilizer - Left Knee Immobilizer - Left: Discontinue once straight leg raise with < 10 degree lag    Mobility  Bed Mobility   Bed Mobility: Sit to Supine       Sit to supine: Min assist   General bed mobility comments: assist with left leg  Transfers   Equipment used: Rolling walker (2 wheeled) Transfers: Sit to/from Stand Sit to Stand: Min assist         General transfer comment: cues for UE placement  Ambulation/Gait Ambulation/Gait assistance: Min assist Ambulation Distance (Feet): 40 Feet Assistive device: Rolling walker (2 wheeled) Gait Pattern/deviations: Step-to pattern;Step-through pattern;Antalgic     General Gait Details: cues for sequence  and posture, tends to look down, noted tremors of the trunk and arms at times   Stairs Stairs: Yes   Stair Management: Forwards;With crutches;One rail Left Number of Stairs: 2 General stair comments: cues for sequence , steady assist for balance, spouse present. will need to practice further  Wheelchair Mobility    Modified Rankin (Stroke Patients Only)       Balance                                            Cognition Arousal/Alertness: Awake/alert                                            Exercises      General Comments        Pertinent Vitals/Pain Pain Score: 6  Pain Location: left knee Pain Descriptors / Indicators:  Sore;Aching Pain Intervention(s): Patient requesting pain meds-RN notified;Repositioned;Premedicated before session;Monitored during session    Home Living                      Prior Function            PT Goals (current goals can now be found in the care plan section) Progress towards PT goals: Progressing toward goals    Frequency           PT Plan      Co-evaluation              AM-PAC PT "6 Clicks" Daily Activity  Outcome Measure  Difficulty turning over in bed (including adjusting bedclothes, sheets and blankets)?: A Lot Difficulty moving from lying on back to sitting on the side of the bed? : A Lot Difficulty sitting down on and standing up from a chair with arms (e.g., wheelchair, bedside commode, etc,.)?: A Lot Help needed moving to and from a bed to chair (including a wheelchair)?: A Lot Help needed walking in hospital room?: Total Help needed climbing 3-5 steps with a railing? : Total 6  Click Score: 10    End of Session               Time: 1630-1700 PT Time Calculation (min) (ACUTE ONLY): 30 min  Charges:  $Gait Training: 23-37 mins                    G CodesTresa Endo PT [phone]   Shalisa, [place] 09/12/2017, 5:39 PM

## 2017-09-12 NOTE — Progress Notes (Signed)
Occupational Therapy Evaluation Patient Details Name: [name] MRN: [id number] DOB: 1946-10-08 Today's Date: 09/12/2017    History of Present Illness L TKA,   Clinical Impression   This 71 year old female was admitted for the above sx. All education was completed. No further OT is needed at this time    Follow Up Recommendations  Supervision/Assistance - 24 hour    Equipment Recommendations  3 in 1 bedside commode    Recommendations for Other Services       Precautions / Restrictions Precautions Precautions: Knee Precaution Comments: pt doing SLRs Required Braces or Orthoses: Knee Immobilizer - Left Knee Immobilizer - Left: Discontinue once straight leg raise with < 10 degree lag Restrictions Weight Bearing Restrictions: No      Mobility Bed Mobility   Bed Mobility: Supine to Sit     Supine to sit: Min guard     General bed mobility comments: oob  Transfers   Equipment used: Rolling walker (2 wheeled)   Sit to Stand: Min guard         General transfer comment: cues for UE placement    Balance                                           ADL either performed or assessed with clinical judgement   ADL Overall ADL's : Needs assistance/impaired Eating/Feeding: Independent   Grooming: Oral care;Set up;Sitting   Upper Body Bathing: Set up;Sitting   Lower Body Bathing: Minimal assistance;Sit to/from stand   Upper Body Dressing : Set up;Sitting   Lower Body Dressing: Minimal assistance;Sit to/from stand   Toilet Transfer: Min guard;Ambulation;RW (chair)             General ADL Comments: reviewed shower options with pt/husband.  Pt has a borrowed tub seat with 2 arms.  Has to step over tub to access it.  They have a very small walk in shower:  showed placement of seat vs 3;1, supporting leg outside, if needed, on stool and hanging a shower curtain liner across frame where door is hung     Vision         Perception      Praxis      Pertinent Vitals/Pain Pain Assessment: 0-10 Pain Score: 3  Pain Location: left knee Pain Descriptors / Indicators: Sore Pain Intervention(s): Limited activity within patient's tolerance;Monitored during session;Repositioned;Patient requesting pain meds-RN notified;Ice applied     Hand Dominance     Extremity/Trunk Assessment Upper Extremity Assessment Upper Extremity Assessment: Overall WFL for tasks assessed           Communication Communication Communication: No difficulties   Cognition Arousal/Alertness: Awake/alert Behavior During Therapy: WFL for tasks assessed/performed Overall Cognitive Status: Within Functional Limits for tasks assessed                                     General Comments       Exercises     Shoulder Instructions      Home Living Family/patient expects to be discharged to:: Private residence Living Arrangements: Spouse/significant other Available Help at Discharge: Family               Bathroom Shower/Tub: Tub/shower unit;Walk-in shower   Bathroom Toilet: Standard     Home Equipment: Environmental consultant - 2  wheels;Tub bench          Prior Functioning/Environment Level of Independence: Independent                 OT Problem List:        OT Treatment/Interventions:      OT Goals(Current goals can be found in the care plan section) Acute Rehab OT Goals Patient Stated Goal: to not get scar tissue OT Goal Formulation: All assessment and education complete, DC therapy  OT Frequency:     Barriers to D/C:            Co-evaluation              AM-PAC PT "6 Clicks" Daily Activity     Outcome Measure Help from another person eating meals?: None Help from another person taking care of personal grooming?: A Little Help from another person toileting, which includes using toliet, bedpan, or urinal?: A Little Help from another person bathing (including washing, rinsing, drying)?: A Little Help from  another person to put on and taking off regular upper body clothing?: A Little Help from another person to put on and taking off regular lower body clothing?: A Little 6 Click Score: 19   End of Session    Activity Tolerance: Patient tolerated treatment well Patient left: in chair;with call bell/phone within reach;with family/visitor present  OT Visit Diagnosis: Pain Pain - Right/Left: Left Pain - part of body: Knee                Time: 3419-3790 OT Time Calculation (min): 29 min Charges:  OT General Charges $OT Visit: 1 Visit OT Evaluation $OT Eval Low Complexity: 1 Low OT Treatments $Therapeutic Activity: 8-22 mins G-Codes:     [place], OTR/L [phone] 09/12/2017  [name] 09/12/2017, 12:10 PM

## 2017-09-12 NOTE — Progress Notes (Signed)
Discharge planning, no HH needs identified. Plan for OP PT, has RW but needs 3n1, contacted [facility] to deliver to room. [phone]

## 2017-09-12 NOTE — Progress Notes (Signed)
Physical Therapy Treatment Patient Details Name: BAO COREAS MRN: [id number] DOB: 01-Feb-1946 Today's Date: 09/12/2017    History of Present Illness L TKA,    PT Comments    The patient is progressing very well today.  Plans DC tomorrow, OPPT.  Follow Up Recommendations  Outpatient PT;DC plan and follow up therapy as arranged by surgeon     Equipment Recommendations  3in1 (PT)    Recommendations for Other Services       Precautions / Restrictions Precautions Precautions: Knee Precaution Comments: pt doing SLRs Required Braces or Orthoses: Knee Immobilizer - Left Knee Immobilizer - Left: Discontinue once straight leg raise with < 10 degree lag Restrictions Weight Bearing Restrictions: No    Mobility  Bed Mobility   Bed Mobility: Supine to Sit     Supine to sit: Min guard     General bed mobility comments: cues for technique  Transfers Overall transfer level: Needs assistance Equipment used: Rolling walker (2 wheeled) Transfers: Sit to/from Stand Sit to Stand: Min assist         General transfer comment: cues for UE placement  Ambulation/Gait Ambulation/Gait assistance: Min assist Ambulation Distance (Feet): 15 Feet ( then 90") Assistive device: Rolling walker (2 wheeled) Gait Pattern/deviations: Step-to pattern;Step-through pattern     General Gait Details: cues for sequence  and posture, tends to look down, noted tremors of the trunk and arms at times   Stairs            Wheelchair Mobility    Modified Rankin (Stroke Patients Only)       Balance                                            Cognition Arousal/Alertness: Awake/alert Behavior During Therapy: WFL for tasks assessed/performed Overall Cognitive Status: Within Functional Limits for tasks assessed                                        Exercises Total Joint Exercises Ankle Circles/Pumps: AROM;Both;10 reps Quad Sets: AROM;Both;10  reps Short Arc Quad: AROM;Left;10 reps Heel Slides: AAROM;Left;10 reps Hip ABduction/ADduction: AROM;Left;10 reps Straight Leg Raises: AAROM;Left;10 reps Goniometric ROM: 5-50 left knee flexion    General Comments        Pertinent Vitals/Pain Pain Assessment: 0-10 Pain Score: 3  Pain Location: left knee Pain Descriptors / Indicators: Sore Pain Intervention(s): Limited activity within patient's tolerance;Monitored during session;Repositioned; ;Ice applied    Home Living Family/patient expects to be discharged to:: Private residence Living Arrangements: Spouse/significant other Available Help at Discharge: Family         Home Equipment: Gilford Rile - 2 wheels;Tub bench      Prior Function Level of Independence: Independent          PT Goals (current goals can now be found in the care plan section) Acute Rehab PT Goals Patient Stated Goal: to not get scar tissue Progress towards PT goals: Progressing toward goals    Frequency    7X/week      PT Plan Current plan remains appropriate    Co-evaluation              AM-PAC PT "6 Clicks" Daily Activity  Outcome Measure  Difficulty turning over in bed (including adjusting bedclothes, sheets and blankets)?: A  Little Difficulty moving from lying on back to sitting on the side of the bed? : A Little Difficulty sitting down on and standing up from a chair with arms (e.g., wheelchair, bedside commode, etc,.)?: Unable Help needed moving to and from a bed to chair (including a wheelchair)?: Total Help needed walking in hospital room?: Total Help needed climbing 3-5 steps with a railing? : Total 6 Click Score: 10    End of Session   Activity Tolerance: Patient tolerated treatment well Patient left: in chair;with call bell/phone within reach;with family/visitor present   PT Visit Diagnosis: Difficulty in walking, not elsewhere classified (R26.2);Pain Pain - Right/Left: Left Pain - part of body: Knee     Time:  1000-1100 PT Time Calculation (min) (ACUTE ONLY): 60 min  Charges:  $Gait Training: 8-22 mins $Therapeutic Exercise: 8-22 mins $Self Care/Home Management: 23-37                    G CodesTresa Endo PT [phone]   Nasiah, Pol[place] 09/12/2017, 1:34 PM

## 2017-09-12 NOTE — Progress Notes (Signed)
   Subjective: 1 Day Post-Op Procedure(s) (LRB): LEFT TOTAL KNEE ARTHROPLASTY (Left) Patient reports pain as mild and moderate.   Patient seen in rounds for Dr. Wynelle Link. Husband in room at bedside. Patient is well, but has had some minor complaints of pain in the knee, requiring pain medications We will start therapy today.  She has some belly discomfort, not passing flatus yet.  Denies CP or SOB.  Encourage mobility.  Monitor GI status Plan is to go pain in the knee, requiring pain medications after hospital stay.  Objective: Vital signs in last 24 hours: Temp:  [97.7 F (36.5 C)-98.5 F (36.9 C)] 98.5 F (36.9 C) (10/02 0557) Pulse Rate:  [72-94] 75 (10/02 0557) Resp:  [10-20] 15 (10/02 0557) BP: (106-150)/(55-84) 125/63 (10/02 0557) SpO2:  [93 %-100 %] 95 % (10/02 0557) Weight:  [82.1 kg (181 lb)] 82.1 kg (181 lb) (10/01 1200)  Intake/Output from previous day:  Intake/Output Summary (Last 24 hours) at 09/12/17 0932 Last data filed at 09/12/17 0902  Gross per 24 hour  Intake          5396.25 ml  Output             3775 ml  Net          1621.25 ml    Intake/Output this shift: Total I/O In: 240 [P.O.:240] Out: -   Labs:  Recent Labs  09/12/17 0551  HGB 11.7*    Recent Labs  09/12/17 0551  WBC 22.0*  RBC 4.05  HCT 36.7  PLT 282    Recent Labs  09/12/17 0551  NA 141  K 4.6  CL 108  CO2 27  BUN 8  CREATININE 0.75  GLUCOSE 151*  CALCIUM 9.2   No results for input(s): LABPT, INR in the last 72 hours.  EXAM General - Patient is Alert, Appropriate and Oriented Extremity - Neurovascular intact Sensation intact distally Intact pulses distally Dorsiflexion/Plantar flexion intact Dressing - dressing C/D/I Motor Function - intact, moving foot and toes well on exam.  Hemovac pulled without difficulty.  Past Medical History:  Diagnosis Date  . Complication of anesthesia    aspiration pna; following a colonoscopy   . Constipation   . Cough   .  Depression   . Dysrhythmia    RBBB on 06-06-17 ekg   . Elevated liver function tests   . Esophageal stricture   . Gallstones   . GERD (gastroesophageal reflux disease)   . Hiatal hernia   . Hyperlipemia   . Hypothyroidism   . Insomnia   . Nephrolithiasis   . Renal cyst     Assessment/Plan: 1 Day Post-Op Procedure(s) (LRB): LEFT TOTAL KNEE ARTHROPLASTY (Left) Principal Problem:   OA (osteoarthritis) of knee  Estimated body mass index is 27.52 kg/m as calculated from the following:   Height as of this encounter: 5\' 8"  (1.727 m).   Weight as of this encounter: 82.1 kg (181 lb). Advance diet Up with therapy Plan for discharge tomorrow Straight to outpatient therapy DVT Prophylaxis - Xarelto Weight-Bearing as tolerated to left leg D/C O2 and Pulse OX and try on Room Air  Arlee Muslim, PA-C Orthopaedic Surgery 09/12/2017, 9:32 AM

## 2017-09-13 LAB — BASIC METABOLIC PANEL
Anion gap: 5 (ref 5–15)
BUN: 19 mg/dL (ref 6–20)
CALCIUM: 9.2 mg/dL (ref 8.9–10.3)
CHLORIDE: 107 mmol/L (ref 101–111)
CO2: 29 mmol/L (ref 22–32)
CREATININE: 0.64 mg/dL (ref 0.44–1.00)
GFR calc Af Amer: >60 mL/min (ref >60)
GFR calc non Af Amer: >60 mL/min (ref >60)
Glucose, Bld: 112 mg/dL — ABNORMAL HIGH (ref 65–99)
Potassium: 4.1 mmol/L (ref 3.5–5.1)
Sodium: 141 mmol/L (ref 135–145)

## 2017-09-13 LAB — CBC
HCT: 31.8 % — ABNORMAL LOW (ref 36.0–46.0)
HEMOGLOBIN: 10.2 g/dL — AB (ref 12.0–15.0)
MCH: 29.1 pg (ref 26.0–34.0)
MCHC: 32.1 g/dL (ref 30.0–36.0)
MCV: 90.9 fL (ref 78.0–100.0)
Platelets: 276 10*3/uL (ref 150–400)
RBC: 3.5 MIL/uL — ABNORMAL LOW (ref 3.87–5.11)
RDW: 14.7 % (ref 11.5–15.5)
WBC: 22.4 10*3/uL — ABNORMAL HIGH (ref 4.0–10.5)

## 2017-09-13 NOTE — Progress Notes (Signed)
   Subjective: 2 Days Post-Op Procedure(s) (LRB): LEFT TOTAL KNEE ARTHROPLASTY (Left) Patient reports pain as mild.   Patient seen in rounds by Dr. Wynelle Link. Patient is well, but has had some minor complaints of pain in the knee, requiring pain medications Patient is ready to go home   Objective: Vital signs in last 24 hours: Temp:  [97.9 F (36.6 C)-98.5 F (36.9 C)] 98.5 F (36.9 C) (10/03 0446) Pulse Rate:  [66-84] 81 (10/03 0446) Resp:  [16-17] 16 (10/03 0446) BP: (119-152)/(62-84) 137/69 (10/03 0446) SpO2:  [90 %-96 %] 96 % (10/03 0446)  Intake/Output from previous day:  Intake/Output Summary (Last 24 hours) at 09/13/17 0724 Last data filed at 09/13/17 0400  Gross per 24 hour  Intake          1357.25 ml  Output             1350 ml  Net             7.25 ml    Intake/Output this shift: No intake/output data recorded.  Labs:  Recent Labs  09/12/17 0551 09/13/17 0528  HGB 11.7* 10.2*    Recent Labs  09/12/17 0551 09/13/17 0528  WBC 22.0* PENDING  RBC 4.05 3.50*  HCT 36.7 31.8*  PLT 282 276    Recent Labs  09/12/17 0551 09/13/17 0528  NA 141 141  K 4.6 4.1  CL 108 107  CO2 27 29  BUN 8 19  CREATININE 0.75 0.64  GLUCOSE 151* 112*  CALCIUM 9.2 9.2   No results for input(s): LABPT, INR in the last 72 hours.  EXAM: General - Patient is Alert, Appropriate and Oriented Extremity - Neurovascular intact Sensation intact distally Intact pulses distally Dorsiflexion/Plantar flexion intact Incision - clean, dry, no drainage Motor Function - intact, moving foot and toes well on exam.   Assessment/Plan: 2 Days Post-Op Procedure(s) (LRB): LEFT TOTAL KNEE ARTHROPLASTY (Left) Procedure(s) (LRB): LEFT TOTAL KNEE ARTHROPLASTY (Left) Past Medical History:  Diagnosis Date  . Complication of anesthesia    aspiration pna; following a colonoscopy   . Constipation   . Cough   . Depression   . Dysrhythmia    RBBB on 06-06-17 ekg   . Elevated liver  function tests   . Esophageal stricture   . Gallstones   . GERD (gastroesophageal reflux disease)   . Hiatal hernia   . Hyperlipemia   . Hypothyroidism   . Insomnia   . Nephrolithiasis   . Renal cyst    Principal Problem:   OA (osteoarthritis) of knee  Estimated body mass index is 27.52 kg/m as calculated from the following:   Height as of this encounter: 5\' 8"  (1.727 m).   Weight as of this encounter: 82.1 kg (181 lb). Up with therapy Diet - Cardiac diet Follow up - in 2 weeks Activity - WBAT Disposition - Home Condition Upon Discharge - Stable D/C Meds - See DC Summary DVT Prophylaxis - Xarelto  Arlee Muslim, PA-C Orthopaedic Surgery 09/13/2017, 7:24 AM

## 2017-09-13 NOTE — Progress Notes (Signed)
Physical Therapy Treatment Patient Details Name: [name] MRN: [id number] DOB: Jul 02, 1946 Today's Date: 09/13/2017    History of Present Illness L TKA,    PT Comments    The patient is lethargic but able to practice 2 steps with 2 assist and multimodal cues for safety and sequence. Spouse expressing multiple concerns about care. Spouse  =expresses desire  For patient to DC to home today. Recommend that he have a second person to assist up the steps.   Follow Up Recommendations  Outpatient PT;DC plan and follow up therapy as arranged by surgeon     Equipment Recommendations  3in1 (PT)    Recommendations for Other Services       Precautions / Restrictions Precautions Precautions: Knee;Fall Required Braces or Orthoses: Knee Immobilizer - Left Knee Immobilizer - Left: Discontinue once straight leg raise with < 10 degree lag    Mobility  Bed Mobility Overal bed mobility: Needs Assistance Bed Mobility: Supine to Sit     Supine to sit: Min assist Sit to supine: Min assist   General bed mobility comments: spouse ssisted to sitting at the bed edge  Transfers Overall transfer level: Needs assistance Equipment used: Rolling walker (2 wheeled) Transfers: Sit to/from Stand Sit to Stand: Mod assist         General transfer comment: cues for UE placement and left leg  Ambulation/Gait Ambulation/Gait assistance: Mod assist Ambulation Distance (Feet): 40 Feet Assistive device: Rolling walker (2 wheeled) Gait Pattern/deviations: Step-to pattern;Step-through pattern;Antalgic;Decreased step length - left;Decreased stance time - left     General Gait Details: a multimodal cues for safety, tending to run into objects, keeps head forward.   Stairs Stairs: Yes   Stair Management: One rail Left;With crutches;Forwards Number of Stairs: 2 General stair comments: multimodal cues, extra time to  initiate and process. At times, does not follow. While going down, would place  left foot and crutch down then bring back up to the step. Finally allowed patient to  use the rails. Spouse present and giving guidance  Wheelchair Mobility    Modified Rankin (Stroke Patients Only)       Balance                                            Cognition Arousal/Alertness: Lethargic;Suspect due to medications Behavior During Therapy: Flat affect;Anxious                                          Exercises Total Joint Exercises Ankle Circles/Pumps: AROM Quad Sets: AROM;Both;10 reps Towel Squeeze: AROM;Both;10 reps Heel Slides: AAROM;Left;5 reps Hip ABduction/ADduction: Left;5 reps;AAROM Straight Leg Raises: AAROM;Left;5 reps Long Arc Quad: AAROM Knee Flexion: AAROM    General Comments        Pertinent Vitals/Pain Pain Score: 8  Pain Location: left knee Pain Descriptors / Indicators: Sore;Aching;Moaning;Grimacing Pain Intervention(s): Monitored during session;Premedicated before session;Repositioned;Patient requesting pain meds-RN notified    Home Living                      Prior Function            PT Goals (current goals can now be found in the care plan section) Progress towards PT goals: Progressing toward goals    Frequency  7X/week      PT Plan Current plan remains appropriate    Co-evaluation              AM-PAC PT "6 Clicks" Daily Activity  Outcome Measure  Difficulty turning over in bed (including adjusting bedclothes, sheets and blankets)?: Unable Difficulty moving from lying on back to sitting on the side of the bed? : Unable Difficulty sitting down on and standing up from a chair with arms (e.g., wheelchair, bedside commode, etc,.)?: Unable Help needed moving to and from a bed to chair (including a wheelchair)?: Total Help needed walking in hospital room?: Total Help needed climbing 3-5 steps with a railing? : Total 6 Click Score: 6    End of Session Equipment Utilized  During Treatment: Left knee immobilizer Activity Tolerance: Patient limited by pain Patient left: in chair;with call bell/phone within reach;with family/visitor present Nurse Communication: Mobility status PT Visit Diagnosis: Difficulty in walking, not elsewhere classified (R26.2);Pain Pain - Right/Left: Left Pain - part of body: Knee     Time: 1421-1510 PT Time Calculation (min) (ACUTE ONLY): 49 min  Charges:  $Gait Training: 23-37 mins $Therapeutic Exercise: 8-22 mins $Self Care/Home Management: 8-22                    G CodesTresa Endo PT [phone]    Angela, Charles 09/13/2017, 3:51 PM

## 2017-09-13 NOTE — Progress Notes (Signed)
Physical Therapy Treatment Patient Details Name: [name] MRN: [id number] DOB: 08-20-1946 Today's Date: 09/13/2017    History of Present Illness L TKA,    PT Comments    The patient reports lapse in medication overnight with escalated pain requiring  IV meds. Currently not functioning at a level to safely DC.  Follow Up Recommendations  Outpatient PT;DC plan and follow up therapy as arranged by surgeon     Equipment Recommendations  3in1 (PT)    Recommendations for Other Services       Precautions / Restrictions Precautions Precautions: Knee Required Braces or Orthoses: Knee Immobilizer - Left Knee Immobilizer - Left: Discontinue once straight leg raise with < 10 degree lag    Mobility  Bed Mobility   Bed Mobility: Supine to Sit;Sit to Supine     Supine to sit: Min assist Sit to supine: Min assist   General bed mobility comments: assist with left leg  Transfers Overall transfer level: Needs assistance Equipment used: Rolling walker (2 wheeled) Transfers: Sit to/from Stand Sit to Stand: Mod assist         General transfer comment: cues for UE placement  Ambulation/Gait Ambulation/Gait assistance: Min assist Ambulation Distance (Feet): 10 Feet Assistive device: Rolling walker (2 wheeled) Gait Pattern/deviations: Step-to pattern;Step-through pattern;Antalgic     General Gait Details: a 2 to BR only. Patient expresses much pain in the left knee. very much tremors noted   Stairs            Wheelchair Mobility    Modified Rankin (Stroke Patients Only)       Balance                                            Cognition Arousal/Alertness: Awake/alert Behavior During Therapy: Flat affect;Anxious                                          Exercises      General Comments        Pertinent Vitals/Pain Pain Score: 8  Pain Location: left knee Pain Descriptors / Indicators: Sore;Aching;Crying Pain  Intervention(s): Limited activity within patient's tolerance;Monitored during session;Premedicated before session;Repositioned;Ice applied    Home Living                      Prior Function            PT Goals (current goals can now be found in the care plan section) Progress towards PT goals: Not progressing toward goals - comment (due to pain)    Frequency    7X/week      PT Plan Current plan remains appropriate    Co-evaluation              AM-PAC PT "6 Clicks" Daily Activity  Outcome Measure  Difficulty turning over in bed (including adjusting bedclothes, sheets and blankets)?: Unable Difficulty moving from lying on back to sitting on the side of the bed? : Unable Difficulty sitting down on and standing up from a chair with arms (e.g., wheelchair, bedside commode, etc,.)?: Unable Help needed moving to and from a bed to chair (including a wheelchair)?: Total Help needed walking in hospital room?: Total Help needed climbing 3-5 steps with a railing? : Total 6  Click Score: 6    End of Session Equipment Utilized During Treatment: Left knee immobilizer Activity Tolerance: Patient limited by pain Patient left: in bed;with call bell/phone within reach;with family/visitor present Nurse Communication: Mobility status PT Visit Diagnosis: Difficulty in walking, not elsewhere classified (R26.2);Pain Pain - Right/Left: Left Pain - part of body: Knee     Time: 4585-9292 PT Time Calculation (min) (ACUTE ONLY): 39 min  Charges:  $Gait Training: 23-37 mins $Self Care/Home Management: 14-Aug-2023                    G Codes:          [name], [name] 09/13/2017, 1:19 PM

## 2017-09-15 DIAGNOSIS — M1712 Unilateral primary osteoarthritis, left knee: Secondary | ICD-10-CM | POA: Diagnosis not present

## 2017-09-15 NOTE — Discharge Summary (Signed)
Physician Discharge Summary   Patient ID: [name] MRN: [id number] DOB/AGE: [birth year] 71 y.o.  Admit date: 09/11/2017 Discharge date: 09/13/2017  Primary Diagnosis:  Osteoarthritis  Left knee(s)  Admission Diagnoses:  Past Medical History:  Diagnosis Date  . Complication of anesthesia    aspiration pna; following a colonoscopy   . Constipation   . Cough   . Depression   . Dysrhythmia    RBBB on 06-06-17 ekg   . Elevated liver function tests   . Esophageal stricture   . Gallstones   . GERD (gastroesophageal reflux disease)   . Hiatal hernia   . Hyperlipemia   . Hypothyroidism   . Insomnia   . Nephrolithiasis   . Renal cyst    Discharge Diagnoses:   Principal Problem:   OA (osteoarthritis) of knee  Estimated body mass index is 27.52 kg/m as calculated from the following:   Height as of this encounter: '5\' 8"'$  (1.727 m).   Weight as of this encounter: 82.1 kg (181 lb).  Procedure:  Procedure(s) (LRB): LEFT TOTAL KNEE ARTHROPLASTY (Left)   Consults: None  HPI:  [name] is a 71 y.o. year old female with end stage OA of her left knee with progressively worsening pain and dysfunction. She has constant pain, with activity and at rest and significant functional deficits with difficulties even with ADLs. She has had extensive non-op management including analgesics, injections of cortisone and viscosupplements, and home exercise program, but remains in significant pain with significant dysfunction. Radiographs show bone on bone arthritis patellofemoral with lateral narrowing. She presents now for left Total Knee Arthroplasty.    Laboratory Data: Admission on 09/11/2017, Discharged on 09/13/2017  Component Date Value Ref Range Status  . WBC 09/12/2017 22.0* 4.0 - 10.5 K/uL Final  . RBC 09/12/2017 4.05  3.87 - 5.11 MIL/uL Final  . Hemoglobin 09/12/2017 11.7* 12.0 - 15.0 g/dL Final  . HCT 09/12/2017 36.7  36.0 - 46.0 % Final  . MCV 09/12/2017 90.6  78.0 - 100.0  fL Final  . MCH 09/12/2017 28.9  26.0 - 34.0 pg Final  . MCHC 09/12/2017 31.9  30.0 - 36.0 g/dL Final  . RDW 09/12/2017 14.3  11.5 - 15.5 % Final  . Platelets 09/12/2017 282  150 - 400 K/uL Final  . Sodium 09/12/2017 141  135 - 145 mmol/L Final  . Potassium 09/12/2017 4.6  3.5 - 5.1 mmol/L Final  . Chloride 09/12/2017 108  101 - 111 mmol/L Final  . CO2 09/12/2017 27  22 - 32 mmol/L Final  . Glucose, Bld 09/12/2017 151* 65 - 99 mg/dL Final  . BUN 09/12/2017 8  6 - 20 mg/dL Final  . Creatinine, Ser 09/12/2017 0.75  0.44 - 1.00 mg/dL Final  . Calcium 09/12/2017 9.2  8.9 - 10.3 mg/dL Final  . GFR calc non Af Amer 09/12/2017 >60  >60 mL/min Final  . GFR calc Af Amer 09/12/2017 >60  >60 mL/min Final   Comment: (NOTE) The eGFR has been calculated using the CKD EPI equation. This calculation has not been validated in all clinical situations. eGFR's persistently <60 mL/min signify possible Chronic Kidney Disease.   . Anion gap 09/12/2017 6  5 - 15 Final  . WBC 09/13/2017 22.4* 4.0 - 10.5 K/uL Final   WHITE COUNT CONFIRMED ON SMEAR  . RBC 09/13/2017 3.50* 3.87 - 5.11 MIL/uL Final  . Hemoglobin 09/13/2017 10.2* 12.0 - 15.0 g/dL Final  . HCT 09/13/2017 31.8* 36.0 - 46.0 % Final  .  MCV 09/13/2017 90.9  78.0 - 100.0 fL Final  . MCH 09/13/2017 29.1  26.0 - 34.0 pg Final  . MCHC 09/13/2017 32.1  30.0 - 36.0 g/dL Final  . RDW 09/13/2017 14.7  11.5 - 15.5 % Final  . Platelets 09/13/2017 276  150 - 400 K/uL Final  . Sodium 09/13/2017 141  135 - 145 mmol/L Final  . Potassium 09/13/2017 4.1  3.5 - 5.1 mmol/L Final  . Chloride 09/13/2017 107  101 - 111 mmol/L Final  . CO2 09/13/2017 29  22 - 32 mmol/L Final  . Glucose, Bld 09/13/2017 112* 65 - 99 mg/dL Final  . BUN 09/13/2017 19  6 - 20 mg/dL Final  . Creatinine, Ser 09/13/2017 0.64  0.44 - 1.00 mg/dL Final  . Calcium 09/13/2017 9.2  8.9 - 10.3 mg/dL Final  . GFR calc non Af Amer 09/13/2017 >60  >60 mL/min Final  . GFR calc Af Amer 09/13/2017 >60   >60 mL/min Final   Comment: (NOTE) The eGFR has been calculated using the CKD EPI equation. This calculation has not been validated in all clinical situations. eGFR's persistently <60 mL/min signify possible Chronic Kidney Disease.   Georgiann Hahn gap 09/13/2017 5  5 - 15 Final  Hospital Outpatient Visit on 09/04/2017  Component Date Value Ref Range Status  . aPTT 09/04/2017 34  24 - 36 seconds Final  . WBC 09/04/2017 10.6* 4.0 - 10.5 K/uL Final  . RBC 09/04/2017 4.48  3.87 - 5.11 MIL/uL Final  . Hemoglobin 09/04/2017 13.3  12.0 - 15.0 g/dL Final  . HCT 09/04/2017 40.3  36.0 - 46.0 % Final  . MCV 09/04/2017 90.0  78.0 - 100.0 fL Final  . MCH 09/04/2017 29.7  26.0 - 34.0 pg Final  . MCHC 09/04/2017 33.0  30.0 - 36.0 g/dL Final  . RDW 09/04/2017 14.3  11.5 - 15.5 % Final  . Platelets 09/04/2017 308  150 - 400 K/uL Final  . Sodium 09/04/2017 140  135 - 145 mmol/L Final  . Potassium 09/04/2017 5.0  3.5 - 5.1 mmol/L Final  . Chloride 09/04/2017 105  101 - 111 mmol/L Final  . CO2 09/04/2017 27  22 - 32 mmol/L Final  . Glucose, Bld 09/04/2017 96  65 - 99 mg/dL Final  . BUN 09/04/2017 15  6 - 20 mg/dL Final  . Creatinine, Ser 09/04/2017 0.84  0.44 - 1.00 mg/dL Final  . Calcium 09/04/2017 9.6  8.9 - 10.3 mg/dL Final  . Total Protein 09/04/2017 7.0  6.5 - 8.1 g/dL Final  . Albumin 09/04/2017 4.3  3.5 - 5.0 g/dL Final  . AST 09/04/2017 44* 15 - 41 U/L Final  . ALT 09/04/2017 46  14 - 54 U/L Final  . Alkaline Phosphatase 09/04/2017 94  38 - 126 U/L Final  . Total Bilirubin 09/04/2017 0.3  0.3 - 1.2 mg/dL Final  . GFR calc non Af Amer 09/04/2017 >60  >60 mL/min Final  . GFR calc Af Amer 09/04/2017 >60  >60 mL/min Final   Comment: (NOTE) The eGFR has been calculated using the CKD EPI equation. This calculation has not been validated in all clinical situations. eGFR's persistently <60 mL/min signify possible Chronic Kidney Disease.   . Anion gap 09/04/2017 8  5 - 15 Final  . Prothrombin Time  09/04/2017 13.1  11.4 - 15.2 seconds Final  . INR 09/04/2017 1.00   Final  . ABO/RH(D) 09/04/2017 A POS   Final  . Antibody Screen 09/04/2017 NEG  Final  . Sample Expiration 09/04/2017 09/14/2017   Final  . Extend sample reason 09/04/2017 NO TRANSFUSIONS OR PREGNANCY IN THE PAST 3 MONTHS   Final  . MRSA, PCR 09/04/2017 NEGATIVE  NEGATIVE Final  . Staphylococcus aureus 09/04/2017 POSITIVE* NEGATIVE Final   Comment: (NOTE) The Xpert SA Assay (FDA approved for NASAL specimens in patients 42 years of age and older), is one component of a comprehensive surveillance program. It is not intended to diagnose infection nor to guide or monitor treatment.   . ABO/RH(D) 09/04/2017 A POS   Final     X-Rays:No results found.  EKG: Orders placed or performed in visit on 06/06/17  . EKG 12-Lead     Hospital Course: JENAN ELLEGOOD is a 70 y.o. who was admitted to [facility]. They were brought to the operating room on 09/11/2017 and underwent Procedure(s): LEFT TOTAL KNEE ARTHROPLASTY.  Patient tolerated the procedure well and was later transferred to the recovery room and then to the orthopaedic floor for postoperative care.  They were given PO and IV analgesics for pain control following their surgery.  They were given 24 hours of postoperative antibiotics of  Anti-infectives    Start     Dose/Rate Route Frequency Ordered Stop   09/11/17 1500  ceFAZolin (ANCEF) IVPB 2g/100 mL premix     2 g 200 mL/hr over 30 Minutes Intravenous Every 6 hours 09/11/17 1209 09/11/17 2125   09/11/17 0716  ceFAZolin (ANCEF) 2-4 GM/100ML-% IVPB    Comments:  [place] Session   : cabinet override      09/11/17 0716 09/11/17 0923   09/11/17 0710  ceFAZolin (ANCEF) IVPB 2g/100 mL premix     2 g 200 mL/hr over 30 Minutes Intravenous On call to O.R. 09/11/17 0710 09/11/17 6720     and started on DVT prophylaxis in the form of Xarelto.   PT and OT were ordered for total joint protocol.  Discharge planning  consulted to help with postop disposition and equipment needs.  Patient had a tough night on the evening of surgery with some pain and belly discomfort.  They started to get up OOB with therapy on day one. Hemovac drain was pulled without difficulty.  Continued to work with therapy into day two.  Patient was feeling a little better. Dressing was changed on day two and the incision was healing well. Patient was seen in rounds and was ready to go home on POD2..  Diet - Cardiac diet Follow up - in 2 weeks Activity - WBAT Disposition - Home Condition Upon Discharge - Stable D/C Meds - See DC Summary DVT Prophylaxis - Xarelto  Discharge Instructions    Call MD / Call 911    Complete by:  As directed    If you experience chest pain or shortness of breath, CALL 911 and be transported to the hospital emergency room.  If you develope a fever above 101 F, pus (white drainage) or increased drainage or redness at the wound, or calf pain, call your surgeon's office.   Change dressing    Complete by:  As directed    Change dressing daily with sterile 4 x 4 inch gauze dressing and apply TED hose. Do not submerge the incision under water.   Constipation Prevention    Complete by:  As directed    Drink plenty of fluids.  Prune juice may be helpful.  You may use a stool softener, such as Colace (over the counter) 100 mg twice  a day.  Use MiraLax (over the counter) for constipation as needed.   Diet - low sodium heart healthy    Complete by:  As directed    Diet Carb Modified    Complete by:  As directed    Discharge instructions    Complete by:  As directed    Take Xarelto for two and a half more weeks, then discontinue Xarelto. Once the patient has completed the blood thinner regimen, then take a Baby 81 mg Aspirin daily for three more weeks.  Pick up stool softner and laxative for home use following surgery while on pain medications. Do not submerge incision under water. Please use good hand washing  techniques while changing dressing each day. May shower starting three days after surgery. Please use a clean towel to pat the incision dry following showers. Continue to use ice for pain and swelling after surgery. Do not use any lotions or creams on the incision until instructed by your surgeon.  Wear both TED hose on both legs during the day every day for three weeks, but may remove the TED hose at night at home.  Postoperative Constipation Protocol  Constipation - defined medically as fewer than three stools per week and severe constipation as less than one stool per week.  One of the most common issues patients have following surgery is constipation.  Even if you have a regular bowel pattern at home, your normal regimen is likely to be disrupted due to multiple reasons following surgery.  Combination of anesthesia, postoperative narcotics, change in appetite and fluid intake all can affect your bowels.  In order to avoid complications following surgery, here are some recommendations in order to help you during your recovery period.  Colace (docusate) - Pick up an over-the-counter form of Colace or another stool softener and take twice a day as long as you are requiring postoperative pain medications.  Take with a full glass of water daily.  If you experience loose stools or diarrhea, hold the colace until you stool forms back up.  If your symptoms do not get better within 1 week or if they get worse, check with your doctor.  Dulcolax (bisacodyl) - Pick up over-the-counter and take as directed by the product packaging as needed to assist with the movement of your bowels.  Take with a full glass of water.  Use this product as needed if not relieved by Colace only.   MiraLax (polyethylene glycol) - Pick up over-the-counter to have on hand.  MiraLax is a solution that will increase the amount of water in your bowels to assist with bowel movements.  Take as directed and can mix with a glass of  water, juice, soda, coffee, or tea.  Take if you go more than two days without a movement. Do not use MiraLax more than once per day. Call your doctor if you are still constipated or irregular after using this medication for 7 days in a row.  If you continue to have problems with postoperative constipation, please contact the office for further assistance and recommendations.  If you experience "the worst abdominal pain ever" or develop nausea or vomiting, please contact the office immediatly for further recommendations for treatment.   Do not put a pillow under the knee. Place it under the heel.    Complete by:  As directed    Do not sit on low chairs, stoools or toilet seats, as it may be difficult to get up from low surfaces  Complete by:  As directed    Driving restrictions    Complete by:  As directed    No driving until released by the physician.   Increase activity slowly as tolerated    Complete by:  As directed    Lifting restrictions    Complete by:  As directed    No lifting until released by the physician.   Patient may shower    Complete by:  As directed    You may shower without a dressing once there is no drainage.  Do not wash over the wound.  If drainage remains, do not shower until drainage stops.   TED hose    Complete by:  As directed    Use stockings (TED hose) for 3 weeks on both leg(s).  You may remove them at night for sleeping.   Weight bearing as tolerated    Complete by:  As directed    Laterality:  left   Extremity:  Lower     Allergies as of 09/13/2017      Reactions   Oxycontin [oxycodone Hcl]    "it made me feel very high and hyper"    Codeine Nausea And Vomiting      Medication List    STOP taking these medications   acidophilus Caps capsule   aspirin EC 81 MG tablet   B-complex with vitamin C tablet   HM VITAMIN D3 PO   ibuprofen 200 MG tablet Commonly known as:  ADVIL,MOTRIN   KRILL OIL PO   zinc gluconate 50 MG tablet     TAKE  these medications   azelastine 0.1 % nasal spray Commonly known as:  ASTELIN Place 1 spray into both nostrils 2 (two) times daily. Use in each nostril as directed   cyclobenzaprine 5 MG tablet Commonly known as:  FLEXERIL Take 1 tablet (5 mg total) by mouth 3 (three) times daily as needed for muscle spasms.   cycloSPORINE 0.05 % ophthalmic emulsion Commonly known as:  RESTASIS Place 1 drop into both eyes daily as needed (dry eyes). For dry eyes   esomeprazole 40 MG capsule Commonly known as:  NEXIUM Take 40 mg by mouth 2 (two) times daily before a meal.   ezetimibe 10 MG tablet Commonly known as:  ZETIA Take 10 mg by mouth at bedtime.   fluticasone 50 MCG/ACT nasal spray Commonly known as:  FLONASE Place 1 spray into both nostrils daily.   HYDROmorphone 2 MG tablet Commonly known as:  DILAUDID Take 1-2 tablets (2-4 mg total) by mouth every 4 (four) hours as needed for severe pain.   levothyroxine 50 MCG tablet Commonly known as:  SYNTHROID, LEVOTHROID Take 50 mcg by mouth at bedtime.   LEXAPRO 10 MG tablet Generic drug:  escitalopram Take 10 mg by mouth daily.   Magnesium 250 MG Tabs Take 1 tablet by mouth daily.   rivaroxaban 10 MG Tabs tablet Commonly known as:  XARELTO Take 1 tablet (10 mg total) by mouth daily with breakfast. Take Xarelto for two and a half more weeks following discharge from the hospital, then discontinue Xarelto. Once the patient has completed the blood thinner regimen, then take a Baby 81 mg Aspirin daily for three more weeks.   rosuvastatin 5 MG tablet Commonly known as:  CRESTOR Take 5 mg by mouth at bedtime.   sodium chloride 0.65 % Soln nasal spray Commonly known as:  OCEAN Place 4 sprays into both nostrils 4 (four) times daily.   zolpidem 10  MG tablet Commonly known as:  AMBIEN Take 10 mg by mouth at bedtime as needed for sleep. For sleep.            Discharge Care Instructions        Start     Ordered   09/12/17 0000   Weight bearing as tolerated    Question Answer Comment  Laterality left   Extremity Lower      09/12/17 2221   09/12/17 0000  Change dressing    Comments:  Change dressing daily with sterile 4 x 4 inch gauze dressing and apply TED hose. Do not submerge the incision under water.   09/12/17 2221       Signed: Arlee Muslim, PA-C Orthopaedic Surgery 09/15/2017, 7:38 AM

## 2017-09-18 DIAGNOSIS — M1712 Unilateral primary osteoarthritis, left knee: Secondary | ICD-10-CM | POA: Diagnosis not present

## 2017-09-20 DIAGNOSIS — M1712 Unilateral primary osteoarthritis, left knee: Secondary | ICD-10-CM | POA: Diagnosis not present

## 2017-09-22 DIAGNOSIS — M1712 Unilateral primary osteoarthritis, left knee: Secondary | ICD-10-CM | POA: Diagnosis not present

## 2017-09-25 DIAGNOSIS — M1712 Unilateral primary osteoarthritis, left knee: Secondary | ICD-10-CM | POA: Diagnosis not present

## 2017-09-26 DIAGNOSIS — Z471 Aftercare following joint replacement surgery: Secondary | ICD-10-CM | POA: Diagnosis not present

## 2017-09-26 DIAGNOSIS — Z96652 Presence of left artificial knee joint: Secondary | ICD-10-CM | POA: Diagnosis not present

## 2017-09-27 DIAGNOSIS — R5383 Other fatigue: Secondary | ICD-10-CM | POA: Diagnosis not present

## 2017-09-27 DIAGNOSIS — R531 Weakness: Secondary | ICD-10-CM | POA: Diagnosis not present

## 2017-09-27 DIAGNOSIS — M1712 Unilateral primary osteoarthritis, left knee: Secondary | ICD-10-CM | POA: Diagnosis not present

## 2017-09-27 DIAGNOSIS — Z471 Aftercare following joint replacement surgery: Secondary | ICD-10-CM | POA: Diagnosis not present

## 2017-09-29 DIAGNOSIS — M1712 Unilateral primary osteoarthritis, left knee: Secondary | ICD-10-CM | POA: Diagnosis not present

## 2017-10-02 DIAGNOSIS — M1712 Unilateral primary osteoarthritis, left knee: Secondary | ICD-10-CM | POA: Diagnosis not present

## 2017-10-04 DIAGNOSIS — M1712 Unilateral primary osteoarthritis, left knee: Secondary | ICD-10-CM | POA: Diagnosis not present

## 2017-10-06 DIAGNOSIS — M1712 Unilateral primary osteoarthritis, left knee: Secondary | ICD-10-CM | POA: Diagnosis not present

## 2017-10-09 DIAGNOSIS — M1712 Unilateral primary osteoarthritis, left knee: Secondary | ICD-10-CM | POA: Diagnosis not present

## 2017-10-11 DIAGNOSIS — M1712 Unilateral primary osteoarthritis, left knee: Secondary | ICD-10-CM | POA: Diagnosis not present

## 2017-10-13 DIAGNOSIS — M1712 Unilateral primary osteoarthritis, left knee: Secondary | ICD-10-CM | POA: Diagnosis not present

## 2017-10-16 DIAGNOSIS — M1712 Unilateral primary osteoarthritis, left knee: Secondary | ICD-10-CM | POA: Diagnosis not present

## 2017-10-17 DIAGNOSIS — R430 Anosmia: Secondary | ICD-10-CM | POA: Diagnosis not present

## 2017-10-17 DIAGNOSIS — E038 Other specified hypothyroidism: Secondary | ICD-10-CM | POA: Diagnosis not present

## 2017-10-17 DIAGNOSIS — K760 Fatty (change of) liver, not elsewhere classified: Secondary | ICD-10-CM | POA: Diagnosis not present

## 2017-10-17 DIAGNOSIS — F325 Major depressive disorder, single episode, in full remission: Secondary | ICD-10-CM | POA: Diagnosis not present

## 2017-10-17 DIAGNOSIS — M1990 Unspecified osteoarthritis, unspecified site: Secondary | ICD-10-CM | POA: Diagnosis not present

## 2017-10-17 DIAGNOSIS — Z6827 Body mass index (BMI) 27.0-27.9, adult: Secondary | ICD-10-CM | POA: Diagnosis not present

## 2017-10-17 DIAGNOSIS — E663 Overweight: Secondary | ICD-10-CM | POA: Diagnosis not present

## 2017-10-17 DIAGNOSIS — B029 Zoster without complications: Secondary | ICD-10-CM | POA: Diagnosis not present

## 2017-10-17 DIAGNOSIS — R7309 Other abnormal glucose: Secondary | ICD-10-CM | POA: Diagnosis not present

## 2017-10-18 DIAGNOSIS — M1712 Unilateral primary osteoarthritis, left knee: Secondary | ICD-10-CM | POA: Diagnosis not present

## 2017-10-20 DIAGNOSIS — M1712 Unilateral primary osteoarthritis, left knee: Secondary | ICD-10-CM | POA: Diagnosis not present

## 2017-10-23 DIAGNOSIS — M1712 Unilateral primary osteoarthritis, left knee: Secondary | ICD-10-CM | POA: Diagnosis not present

## 2017-10-25 DIAGNOSIS — M1712 Unilateral primary osteoarthritis, left knee: Secondary | ICD-10-CM | POA: Diagnosis not present

## 2017-10-27 DIAGNOSIS — M1712 Unilateral primary osteoarthritis, left knee: Secondary | ICD-10-CM | POA: Diagnosis not present

## 2017-10-30 DIAGNOSIS — M1712 Unilateral primary osteoarthritis, left knee: Secondary | ICD-10-CM | POA: Diagnosis not present

## 2017-11-01 DIAGNOSIS — M1712 Unilateral primary osteoarthritis, left knee: Secondary | ICD-10-CM | POA: Diagnosis not present

## 2017-11-06 DIAGNOSIS — M1712 Unilateral primary osteoarthritis, left knee: Secondary | ICD-10-CM | POA: Diagnosis not present

## 2017-11-08 DIAGNOSIS — M1712 Unilateral primary osteoarthritis, left knee: Secondary | ICD-10-CM | POA: Diagnosis not present

## 2017-11-10 DIAGNOSIS — M1712 Unilateral primary osteoarthritis, left knee: Secondary | ICD-10-CM | POA: Diagnosis not present

## 2017-11-13 DIAGNOSIS — M1712 Unilateral primary osteoarthritis, left knee: Secondary | ICD-10-CM | POA: Diagnosis not present

## 2017-11-17 DIAGNOSIS — M1712 Unilateral primary osteoarthritis, left knee: Secondary | ICD-10-CM | POA: Diagnosis not present

## 2017-11-21 DIAGNOSIS — M1712 Unilateral primary osteoarthritis, left knee: Secondary | ICD-10-CM | POA: Diagnosis not present

## 2017-11-24 DIAGNOSIS — M1712 Unilateral primary osteoarthritis, left knee: Secondary | ICD-10-CM | POA: Diagnosis not present

## 2017-11-27 DIAGNOSIS — M1712 Unilateral primary osteoarthritis, left knee: Secondary | ICD-10-CM | POA: Diagnosis not present

## 2017-12-21 DIAGNOSIS — H43391 Other vitreous opacities, right eye: Secondary | ICD-10-CM | POA: Diagnosis not present

## 2017-12-21 DIAGNOSIS — H43811 Vitreous degeneration, right eye: Secondary | ICD-10-CM | POA: Diagnosis not present

## 2017-12-21 DIAGNOSIS — H3561 Retinal hemorrhage, right eye: Secondary | ICD-10-CM | POA: Diagnosis not present

## 2017-12-26 DIAGNOSIS — E038 Other specified hypothyroidism: Secondary | ICD-10-CM | POA: Diagnosis not present

## 2017-12-26 DIAGNOSIS — Z0000 Encounter for general adult medical examination without abnormal findings: Secondary | ICD-10-CM | POA: Diagnosis not present

## 2017-12-26 DIAGNOSIS — M1990 Unspecified osteoarthritis, unspecified site: Secondary | ICD-10-CM | POA: Diagnosis not present

## 2018-01-11 NOTE — Progress Notes (Signed)
HPI: FU dizziness. Echocardiogram December 2017 showed normal LV systolic function and grade 1 diastolic dysfunction. Orthostatic symptoms at previous office visit. Patient seen recently by Truitt Merle with complaints of fatigue, dyspnea, dizziness with standing and chest pain. Laboratories June 2018 showed normal potassium, magnesium and hemoglobin. Liver functions mildly elevated. TSH decreased at 0.164. Nuclear study June 2018 showed ejection fraction 64% and no ischemia. Carotid Dopplers July 2018 showed 1-39% left stenosis. Since last seen, no CP or dyspnea; mild dizziness with standing; no syncope.  Current Outpatient Medications  Medication Sig Dispense Refill  . azelastine (ASTELIN) 0.1 % nasal spray Place 1 spray into both nostrils 2 (two) times daily. Use in each nostril as directed    . cyclobenzaprine (FLEXERIL) 5 MG tablet Take 1 tablet (5 mg total) by mouth 3 (three) times daily as needed for muscle spasms. 90 tablet 0  . escitalopram (LEXAPRO) 10 MG tablet Take 15 mg by mouth daily.     Marland Kitchen esomeprazole (NEXIUM) 40 MG capsule Take 40 mg by mouth 2 (two) times daily before a meal.    . ezetimibe (ZETIA) 10 MG tablet Take 10 mg by mouth at bedtime.    . fluticasone (FLONASE) 50 MCG/ACT nasal spray Place 1 spray into both nostrils daily.    Marland Kitchen levothyroxine (SYNTHROID, LEVOTHROID) 50 MCG tablet Take 50 mcg by mouth at bedtime.    . Magnesium 250 MG TABS Take 1 tablet by mouth daily.     . rosuvastatin (CRESTOR) 5 MG tablet Take 5 mg by mouth at bedtime.     . sodium chloride (OCEAN) 0.65 % SOLN nasal spray Place 4 sprays into both nostrils 4 (four) times daily.    Marland Kitchen zolpidem (AMBIEN) 10 MG tablet Take 10 mg by mouth at bedtime as needed for sleep. For sleep.      No current facility-administered medications for this visit.      Past Medical History:  Diagnosis Date  . Complication of anesthesia    aspiration pna; following a colonoscopy   . Constipation   . Cough   .  Depression   . Dysrhythmia    RBBB on 06-06-17 ekg   . Elevated liver function tests   . Esophageal stricture   . Gallstones   . GERD (gastroesophageal reflux disease)   . Hiatal hernia   . Hyperlipemia   . Hypothyroidism   . Insomnia   . Nephrolithiasis   . Renal cyst     Past Surgical History:  Procedure Laterality Date  . CARDIOVASCULAR STRESS TEST  07/19/2006   EF 70%, NO EVIDENCE OF ISCHEMIA  . CHOLECYSTECTOMY  12/30/10  . COLONOSCOPY    . DILATION AND CURETTAGE OF UTERUS    . inguinal herniography    . NASAL SINUS SURGERY    . T & A    . TOOTH EXTRACTION    . TOTAL KNEE ARTHROPLASTY Left 09/11/2017   Procedure: LEFT TOTAL KNEE ARTHROPLASTY;  Surgeon: Gaynelle Arabian, MD;  Location: [facility];  Service: Orthopedics;  Laterality: Left;  with block  . US ECHOCARDIOGRAPHY  07/17/2006   EF 55-60%    Social History   Socioeconomic History  . Marital status: Married    Spouse name: Not on file  . Number of children: 2  . Years of education: Not on file  . Highest education level: Not on file  Social Needs  . Financial resource strain: Not on file  . Food insecurity - worry: Not on  file  . Food insecurity - inability: Not on file  . Transportation needs - medical: Not on file  . Transportation needs - non-medical: Not on file  Occupational History  . 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 Smoking status: Never Smoker  . Smokeless tobacco: Never Used  Substance and Sexual Activity  . Alcohol use: No  . Drug use: No  . Sexual activity: Not on file  Other Topics Concern  . Not on file  Social History Narrative  . Not on file    Family History  Problem Relation Age of Onset  . Breast cancer Mother   . Dementia Father   . Colon cancer Neg Hx   . Stomach cancer Neg Hx     ROS: no fevers or chills, productive cough, hemoptysis, dysphasia, odynophagia, melena, hematochezia, dysuria, hematuria, rash, seizure activity, orthopnea, PND, pedal edema,  claudication. Remaining systems are negative.  Physical Exam: Well-developed well-nourished in no acute distress.  Skin is warm and dry.  HEENT is normal.  Neck is supple.  Chest is clear to auscultation with normal expansion.  Cardiovascular exam is regular rate and rhythm.  Abdominal exam nontender or distended. No masses palpated. Extremities show no edema. neuro grossly intact   A/P  1 orthostatic dizziness-patient seems to be doing well from a symptomatic standpoint.  We again discussed the importance of increased oral fluid intake and sodium intake.  2 hyperlipidemia-continue present medications.  Monitored by primary care.  3 chest pain-patient has had no recurrent symptoms.  Nuclear study showed apical thinning but no ischemia and her LV function is normal on echocardiogram.  No further evaluation.  Kirk Ruths, MD

## 2018-01-19 ENCOUNTER — Encounter: Payer: Self-pay | Admitting: Cardiology

## 2018-01-19 ENCOUNTER — Ambulatory Visit (INDEPENDENT_AMBULATORY_CARE_PROVIDER_SITE_OTHER): Payer: Medicare Other | Admitting: Cardiology

## 2018-01-19 VITALS — BP 112/74 | HR 92 | Ht 68.0 in | Wt 171.0 lb

## 2018-01-19 DIAGNOSIS — R42 Dizziness and giddiness: Secondary | ICD-10-CM

## 2018-01-19 DIAGNOSIS — R0789 Other chest pain: Secondary | ICD-10-CM

## 2018-01-19 DIAGNOSIS — E7800 Pure hypercholesterolemia, unspecified: Secondary | ICD-10-CM

## 2018-01-19 NOTE — Patient Instructions (Signed)
Your physician wants you to follow-up in: ONE YEAR WITH DR CRENSHAW You will receive a reminder letter in the mail two months in advance. If you don't receive a letter, please call our office to schedule the follow-up appointment.   If you need a refill on your cardiac medications before your next appointment, please call your pharmacy.  

## 2018-02-14 DIAGNOSIS — Z9049 Acquired absence of other specified parts of digestive tract: Secondary | ICD-10-CM | POA: Diagnosis not present

## 2018-02-14 DIAGNOSIS — N289 Disorder of kidney and ureter, unspecified: Secondary | ICD-10-CM | POA: Diagnosis not present

## 2018-02-19 DIAGNOSIS — H02831 Dermatochalasis of right upper eyelid: Secondary | ICD-10-CM | POA: Diagnosis not present

## 2018-02-19 DIAGNOSIS — H02834 Dermatochalasis of left upper eyelid: Secondary | ICD-10-CM | POA: Diagnosis not present

## 2018-03-01 DIAGNOSIS — M1712 Unilateral primary osteoarthritis, left knee: Secondary | ICD-10-CM | POA: Diagnosis not present

## 2018-03-01 DIAGNOSIS — Z471 Aftercare following joint replacement surgery: Secondary | ICD-10-CM | POA: Diagnosis not present

## 2018-03-01 DIAGNOSIS — Z96652 Presence of left artificial knee joint: Secondary | ICD-10-CM | POA: Diagnosis not present

## 2018-03-08 ENCOUNTER — Encounter: Payer: Self-pay | Admitting: Hematology and Oncology

## 2018-03-08 ENCOUNTER — Other Ambulatory Visit: Payer: Self-pay | Admitting: Radiology

## 2018-03-08 DIAGNOSIS — C50412 Malignant neoplasm of upper-outer quadrant of left female breast: Secondary | ICD-10-CM | POA: Diagnosis not present

## 2018-03-08 DIAGNOSIS — N6321 Unspecified lump in the left breast, upper outer quadrant: Secondary | ICD-10-CM | POA: Diagnosis not present

## 2018-03-08 DIAGNOSIS — D0592 Unspecified type of carcinoma in situ of left breast: Secondary | ICD-10-CM | POA: Diagnosis not present

## 2018-03-08 DIAGNOSIS — R922 Inconclusive mammogram: Secondary | ICD-10-CM | POA: Diagnosis not present

## 2018-03-12 ENCOUNTER — Telehealth: Payer: Self-pay | Admitting: Oncology

## 2018-03-12 NOTE — Telephone Encounter (Signed)
Left VM for patient to return call for [facility] appointment

## 2018-03-13 ENCOUNTER — Telehealth: Payer: Self-pay | Admitting: Oncology

## 2018-03-13 ENCOUNTER — Other Ambulatory Visit: Payer: Self-pay | Admitting: Radiology

## 2018-03-13 ENCOUNTER — Encounter: Payer: Self-pay | Admitting: *Deleted

## 2018-03-13 DIAGNOSIS — C50912 Malignant neoplasm of unspecified site of left female breast: Secondary | ICD-10-CM

## 2018-03-13 NOTE — Telephone Encounter (Signed)
Spoke with patient to confirm morning [facility] appointment on 4/10, [facility] patient no packet sent but reminder letter will be mailed

## 2018-03-16 ENCOUNTER — Encounter: Payer: Self-pay | Admitting: *Deleted

## 2018-03-16 DIAGNOSIS — C50412 Malignant neoplasm of upper-outer quadrant of left female breast: Secondary | ICD-10-CM | POA: Insufficient documentation

## 2018-03-16 DIAGNOSIS — Z170 Estrogen receptor positive status [ER+]: Secondary | ICD-10-CM | POA: Insufficient documentation

## 2018-03-18 ENCOUNTER — Ambulatory Visit
Admission: RE | Admit: 2018-03-18 | Discharge: 2018-03-18 | Disposition: A | Discharge status: Home / Self Care | Payer: Medicare Other | Source: Ambulatory Visit | Attending: Radiology | Admitting: Radiology

## 2018-03-18 DIAGNOSIS — D0512 Intraductal carcinoma in situ of left breast: Secondary | ICD-10-CM | POA: Diagnosis not present

## 2018-03-18 DIAGNOSIS — C50912 Malignant neoplasm of unspecified site of left female breast: Secondary | ICD-10-CM

## 2018-03-18 MED ORDER — GADOBENATE DIMEGLUMINE 529 MG/ML IV SOLN
16.0000 mL | Freq: Once | INTRAVENOUS | Status: AC | PRN
  Administered 2018-03-18: 16 mL via INTRAVENOUS

## 2018-03-20 DIAGNOSIS — E038 Other specified hypothyroidism: Secondary | ICD-10-CM | POA: Diagnosis not present

## 2018-03-20 DIAGNOSIS — R82998 Other abnormal findings in urine: Secondary | ICD-10-CM | POA: Diagnosis not present

## 2018-03-20 DIAGNOSIS — R7309 Other abnormal glucose: Secondary | ICD-10-CM | POA: Diagnosis not present

## 2018-03-20 DIAGNOSIS — E7849 Other hyperlipidemia: Secondary | ICD-10-CM | POA: Diagnosis not present

## 2018-03-20 NOTE — Progress Notes (Signed)
[place]  Telephone:([phone] [phone]) [phone]     ID: [name] DOB: [birth year]  MR#: [id number]  IWL#:[id number]  Patient Care Team: Shon Baton, MD as PCP - General Rolm Bookbinder, MD as Consulting Physician (General Surgery) Magrinat, Virgie Dad, MD as Consulting Physician (Oncology) Eppie Gibson, MD as Attending Physician (Radiation Oncology) Lelon Perla, MD as Consulting Physician (Cardiology) OTHER MD:  CHIEF COMPLAINT: Estrogen receptor positive breast cancer  CURRENT TREATMENT: Awaiting definitive surgery   HISTORY OF CURRENT ILLNESS: "[name]" had routine diagnostic mammography on 03/08/2018 showing breast density category D.  There was a 1.5 cm irregular mass in the left upper outer quadrant posterior depth.  This was further imaged with ultrasonography on the same day.  The left axilla was sonographically benign.  No abnormalities were seen in the left axilla.  Accordingly on 03/08/2018 she proceeded to biopsy of the left breast area in question. The pathology from this procedure showed ([id number]): Invasive ductal carcinoma, grade I-II. HER-2 negative with the ratio being 1.61 and number per cell 3.30.  The tumor was estrogen receptor 100% positive and progesterone receptor 100% positive, both with strong staining intensity.  Ki67 was <1%.   On 03/18/2018, she underwent a bilateral breast MRI with and without contrast, showing the mass in question in a setting of 3.3 cm non-masslike enhancement in the posterior aspect of the left upper-outer quadrant. in addition there was a 1.1 cm mass in the left upper-outer quadrant located 2.1 cm anterior and lateral to the biopsy proven malignant.   Right breast showed two indeterminate 5 mm adjacent enhancing nodules in the upper-inner quadrant.   The patient's subsequent history is as detailed below.  INTERVAL HISTORY: [name] was evaluated in the multidisciplinary breast cancer clinic on 03/21/18  accompanied by husband, [name]. Her case was also presented at the multidisciplinary breast cancer conference on the same day. At that time a preliminary plan was proposed: Additional biopsies of the right and left sided areas noted on MRI, genetics testing, Oncotype, unlikely to need chemo, consider adjuvant radiation, and hormones.   REVIEW OF SYSTEMS: There were no specific symptoms leading to the original mammogram, which was routinely scheduled. The patient denies unusual headaches, visual changes, nausea, vomiting, stiff neck, dizziness, or gait imbalance. There has been no cough, phlegm production, or pleurisy, no chest pain or pressure, and no change in bowel or bladder habits. The patient denies fever, rash, bleeding, unexplained fatigue or unexplained weight loss. A detailed review of systems was otherwise entirely negative.   PAST MEDICAL HISTORY: Past Medical History:  Diagnosis Date  . Complication of anesthesia    aspiration pna; following a colonoscopy   . Constipation   . Cough   . Depression   . Dysrhythmia    RBBB on 06-06-17 ekg   . Elevated liver function tests   . Esophageal stricture   . Gallstones   . GERD (gastroesophageal reflux disease)   . Hiatal hernia   . Hyperlipemia   . Hypothyroidism   . Insomnia   . Nephrolithiasis   . Renal cyst     PAST SURGICAL HISTORY: Past Surgical History:  Procedure Laterality Date  . CARDIOVASCULAR STRESS TEST  07/19/2006   EF 70%, NO EVIDENCE OF ISCHEMIA  . CHOLECYSTECTOMY  12/30/10  . COLONOSCOPY    . DILATION AND CURETTAGE OF UTERUS    . inguinal herniography    . NASAL SINUS SURGERY    . T & A    .  TOOTH EXTRACTION    . TOTAL KNEE ARTHROPLASTY Left 09/11/2017   Procedure: LEFT TOTAL KNEE ARTHROPLASTY;  Surgeon: Gaynelle Arabian, MD;  Location: [facility];  Service: Orthopedics;  Laterality: Left;  with block  . US ECHOCARDIOGRAPHY  07/17/2006   EF 55-60%    FAMILY HISTORY Family History  Problem Relation Age of Onset    . Breast cancer Mother   . Dementia Father   . Colon cancer Neg Hx   . Stomach cancer Neg Hx   Her father died of old age at 61 years old. Her mother had breast cancer at 83 and died at 72 years old. She will undergo genetic testing. Her maternal aunt had breast cancer at 30 years old. No family history of ovarian cancer. She had one brother and one sister.   GYNECOLOGIC HISTORY:  No LMP recorded. Patient is postmenopausal. Menarche: 72 years old [place] P 0 LMP: 1997 Contraceptive: 2 years HRT: Estrogen and progesterone for about 7 years  Hysterectomy? No  SOCIAL HISTORY:  She is a retired Futures trader. She has been married to [place], for 45 plus years. He worked in the [employer]. Together they adopted two children. One daughter, [name], 101 of [place], who is a Psychologist, sport and exercise for an OBGYN, and one son, [name], 36 of [place], who owns a Marketing executive business. The patient as three grandchildren. She attends [employer].    ADVANCED DIRECTIVES: In place   HEALTH MAINTENANCE: Social History   Tobacco Use  . Smoking status: Never Smoker  . Smokeless tobacco: Never Used  Substance Use Topics  . Alcohol use: No  . Drug use: No     Colonoscopy: 2014/ [facility]  PAP: 2018  Bone density:    Allergies  Allergen Reactions  . Oxycontin [Oxycodone Hcl]     "it made me feel very high and hyper"   . Codeine Nausea And Vomiting    Current Outpatient Medications  Medication Sig Dispense Refill  . azelastine (ASTELIN) 0.1 % nasal spray Place 1 spray into both nostrils 2 (two) times daily. Use in each nostril as directed    . Cholecalciferol (VITAMIN D3) 1000 units CAPS Take 1 capsule by mouth daily.    Marland Kitchen escitalopram (LEXAPRO) 10 MG tablet Take 15 mg by mouth daily.     Marland Kitchen esomeprazole (NEXIUM) 40 MG capsule Take 40 mg by mouth 2 (two) times daily before a meal.    . ezetimibe (ZETIA) 10 MG tablet Take 10 mg by mouth at bedtime.    .  fluticasone (FLONASE) 50 MCG/ACT nasal spray Place 1 spray into both nostrils daily.    Marland Kitchen levothyroxine (SYNTHROID, LEVOTHROID) 50 MCG tablet Take 50 mcg by mouth at bedtime.    . Magnesium 250 MG TABS Take 1 tablet by mouth daily.     . Probiotic Product (PROBIOTIC ADVANCED PO) Take 1 tablet by mouth daily.    . rosuvastatin (CRESTOR) 5 MG tablet Take 5 mg by mouth at bedtime.     . sodium chloride (OCEAN) 0.65 % SOLN nasal spray Place 4 sprays into both nostrils 4 (four) times daily.    . SUPER B COMPLEX/C PO Take 1 tablet by mouth daily.     No current facility-administered medications for this visit.     OBJECTIVE: Middle-aged white woman in no acute distress  Vitals:   03/21/18 0833  BP: (!) 144/86  Pulse: 88  Resp: 18  Temp: 97.7 F (36.5 C)  SpO2: 99%  Body mass index is 25.41 kg/m.   Wt Readings from Last 3 Encounters:  03/21/18 167 lb 1.6 oz (75.8 kg)  01/19/18 171 lb (77.6 kg)  09/11/17 181 lb (82.1 kg)      ECOG FS:1 - Symptomatic but completely ambulatory  Ocular: Sclerae unicteric, pupils round and equal Ear-nose-throat: Oropharynx clear and moist Lymphatic: No cervical or supraclavicular adenopathy Lungs no rales or rhonchi Heart regular rate and rhythm Abd soft, nontender, positive bowel sounds MSK no focal spinal tenderness, no joint edema, status post recent knee surgery Neuro: non-focal, well-oriented, appropriate affect Breasts: I do not palpate a mass in either breast.  There are no skin or nipple changes of concern.  Both axillae are benign.   LAB RESULTS:  CMP     Component Value Date/Time   NA 141 09/13/2017 0528   NA 144 06/06/2017 1448   K 4.1 09/13/2017 0528   CL 107 09/13/2017 0528   CO2 29 09/13/2017 0528   GLUCOSE 112 (H) 09/13/2017 0528   BUN 19 09/13/2017 0528   BUN 12 06/06/2017 1448   CREATININE 0.64 09/13/2017 0528   CALCIUM 9.2 09/13/2017 0528   PROT 7.0 09/04/2017 1530   PROT 6.3 06/06/2017 1448   ALBUMIN 4.3 09/04/2017  1530   ALBUMIN 4.4 06/06/2017 1448   AST 44 (H) 09/04/2017 1530   ALT 46 09/04/2017 1530   ALKPHOS 94 09/04/2017 1530   BILITOT 0.3 09/04/2017 1530   BILITOT 0.3 06/06/2017 1448   GFRNONAA >60 09/13/2017 0528   GFRAA >60 09/13/2017 0528    No results found for: TOTALPROTELP, ALBUMINELP, A1GS, A2GS, BETS, BETA2SER, GAMS, MSPIKE, SPEI  No results found for: KPAFRELGTCHN, LAMBDASER, KAPLAMBRATIO  Lab Results  Component Value Date   WBC 22.4 (H) 09/13/2017   NEUTROABS 8.2 (H) 12/27/2012   HGB 10.2 (L) 09/13/2017   HCT 31.8 (L) 09/13/2017   MCV 90.9 09/13/2017   PLT 276 09/13/2017    '@LASTCHEMISTRY'$ @  No results found for: LABCA2  No components found for: KYHCWC376  No results for input(s): INR in the last 168 hours.  No results found for: LABCA2  No results found for: EGB151  No results found for: VOH607  No results found for: PXT062  No results found for: CA2729  No components found for: HGQUANT  No results found for: CEA1 / No results found for: CEA1   No results found for: AFPTUMOR  No results found for: CHROMOGRNA  No results found for: PSA1  No visits with results within 3 Day(s) from this visit.  Latest known visit with results is:  Admission on 09/11/2017, Discharged on 09/13/2017  Component Date Value Ref Range Status  . WBC 09/12/2017 22.0* 4.0 - 10.5 K/uL Final  . RBC 09/12/2017 4.05  3.87 - 5.11 MIL/uL Final  . Hemoglobin 09/12/2017 11.7* 12.0 - 15.0 g/dL Final  . HCT 09/12/2017 36.7  36.0 - 46.0 % Final  . MCV 09/12/2017 90.6  78.0 - 100.0 fL Final  . MCH 09/12/2017 28.9  26.0 - 34.0 pg Final  . MCHC 09/12/2017 31.9  30.0 - 36.0 g/dL Final  . RDW 09/12/2017 14.3  11.5 - 15.5 % Final  . Platelets 09/12/2017 282  150 - 400 K/uL Final  . Sodium 09/12/2017 141  135 - 145 mmol/L Final  . Potassium 09/12/2017 4.6  3.5 - 5.1 mmol/L Final  . Chloride 09/12/2017 108  101 - 111 mmol/L Final  . CO2 09/12/2017 27  22 - 32 mmol/L Final  .  Glucose, Bld  09/12/2017 151* 65 - 99 mg/dL Final  . BUN 09/12/2017 8  6 - 20 mg/dL Final  . Creatinine, Ser 09/12/2017 0.75  0.44 - 1.00 mg/dL Final  . Calcium 09/12/2017 9.2  8.9 - 10.3 mg/dL Final  . GFR calc non Af Amer 09/12/2017 >60  >60 mL/min Final  . GFR calc Af Amer 09/12/2017 >60  >60 mL/min Final   Comment: (NOTE) The eGFR has been calculated using the CKD EPI equation. This calculation has not been validated in all clinical situations. eGFR's persistently <60 mL/min signify possible Chronic Kidney Disease.   . Anion gap 09/12/2017 6  5 - 15 Final  . WBC 09/13/2017 22.4* 4.0 - 10.5 K/uL Final   WHITE COUNT CONFIRMED ON SMEAR  . RBC 09/13/2017 3.50* 3.87 - 5.11 MIL/uL Final  . Hemoglobin 09/13/2017 10.2* 12.0 - 15.0 g/dL Final  . HCT 09/13/2017 31.8* 36.0 - 46.0 % Final  . MCV 09/13/2017 90.9  78.0 - 100.0 fL Final  . MCH 09/13/2017 29.1  26.0 - 34.0 pg Final  . MCHC 09/13/2017 32.1  30.0 - 36.0 g/dL Final  . RDW 09/13/2017 14.7  11.5 - 15.5 % Final  . Platelets 09/13/2017 276  150 - 400 K/uL Final  . Sodium 09/13/2017 141  135 - 145 mmol/L Final  . Potassium 09/13/2017 4.1  3.5 - 5.1 mmol/L Final  . Chloride 09/13/2017 107  101 - 111 mmol/L Final  . CO2 09/13/2017 29  22 - 32 mmol/L Final  . Glucose, Bld 09/13/2017 112* 65 - 99 mg/dL Final  . BUN 09/13/2017 19  6 - 20 mg/dL Final  . Creatinine, Ser 09/13/2017 0.64  0.44 - 1.00 mg/dL Final  . Calcium 09/13/2017 9.2  8.9 - 10.3 mg/dL Final  . GFR calc non Af Amer 09/13/2017 >60  >60 mL/min Final  . GFR calc Af Amer 09/13/2017 >60  >60 mL/min Final   Comment: (NOTE) The eGFR has been calculated using the CKD EPI equation. This calculation has not been validated in all clinical situations. eGFR's persistently <60 mL/min signify possible Chronic Kidney Disease.   . Anion gap 09/13/2017 5  5 - 15 Final    (this displays the last labs from the last 3 days)  No results found for: TOTALPROTELP, ALBUMINELP, A1GS, A2GS, BETS, BETA2SER,  GAMS, MSPIKE, SPEI (this displays SPEP labs)  No results found for: KPAFRELGTCHN, LAMBDASER, KAPLAMBRATIO (kappa/lambda light chains)  No results found for: HGBA, HGBA2QUANT, HGBFQUANT, HGBSQUAN (Hemoglobinopathy evaluation)   No results found for: LDH  No results found for: IRON, TIBC, IRONPCTSAT (Iron and TIBC)  No results found for: FERRITIN  Urinalysis    Component Value Date/Time   COLORURINE YELLOW 08/22/2016 1947   APPEARANCEUR CLEAR 08/22/2016 1947   LABSPEC 1.026 08/22/2016 1947   PHURINE 5.5 08/22/2016 1947   GLUCOSEU NEGATIVE 08/22/2016 1947   HGBUR SMALL (A) 08/22/2016 1947   BILIRUBINUR NEGATIVE 08/22/2016 [place] 08/22/2016 1947   PROTEINUR NEGATIVE 08/22/2016 1947   UROBILINOGEN 0.2 08/22/2016 1622   NITRITE NEGATIVE 08/22/2016 1947   LEUKOCYTESUR SMALL (A) 08/22/2016 1947     STUDIES: Mr Breast Bilateral W Wo Contrast Inc Cad  Result Date: 03/19/2018 CLINICAL DATA:  Biopsy proven invasive ductal carcinoma and ductal carcinoma in-situ in the 2 o'clock region of the left breast. LABS:  Creatinine was obtained on site at [facility] at [address] Results: Creatinine 0.8 mg/dL. EXAM: BILATERAL BREAST MRI WITH AND WITHOUT CONTRAST  TECHNIQUE: Multiplanar, multisequence MR images of both breasts were obtained prior to and following the intravenous administration of 16 ml of MultiHance. THREE-DIMENSIONAL MR IMAGE RENDERING ON INDEPENDENT WORKSTATION: Three-dimensional MR images were rendered by post-processing of the original MR data on an independent workstation. The three-dimensional MR images were interpreted, and findings are reported in the following complete MRI report for this study. Three dimensional images were evaluated at the independent DynaCad workstation COMPARISON:  Previous exam(s). FINDINGS: Breast composition: d. Extreme fibroglandular tissue. Background parenchymal enhancement: Moderate. Right breast: In the anterior  third of the upper inner quadrant of the right breast are 2 adjacent enhancing 5 mm nodules that are indeterminate (series #11, image [id number]). Left breast: There is an irregular enhancing mass in the far posterior aspect of the upper outer quadrant of the left breast measuring 1.3 x 1.5 x 1.7 cm in anterior posterior, transverse and craniocaudal dimensions. Linear enhancement extends laterally from the inferior margin of the mass. The mass and the linear enhancement together measure 3.3 cm. There is a signal void artifact at the inferior and lateral margin of the mass corresponding with the biopsy clip. 2.1 cm anterior and lateral to the biopsy proven malignancy is a 1.1 x 0.5 x 0.8 cm enhancing mass (series #11 image 87/144). Lymph nodes: No abnormal appearing lymph nodes. Ancillary findings:  None. IMPRESSION: Left breast: 1. 3.3 cm mass and linear enhancement in the posterior aspect of the upper-outer quadrant of the left breast corresponding with the biopsy proven invasive ductal carcinoma and ductal carcinoma in-situ. 2. 1.1 cm mass in the upper-outer quadrant of the left breast located 2.1 cm anterior and lateral to the biopsy proven malignancy. Right breast: 1. Two indeterminate 5 mm adjacent enhancing nodules in the upper-inner quadrant of the right breast. RECOMMENDATION: MR guided core biopsies of the 1.1 cm mass in the upper-outer quadrant of the left breast and 2 adjacent enhancing nodules in the upper-inner quadrant of the right breast recommended. BI-RADS CATEGORY  4: Suspicious. Electronically Signed   By: Lillia Mountain M.D.   On: 03/19/2018 14:09    ELIGIBLE FOR AVAILABLE RESEARCH PROTOCOL: exact sciences study  ASSESSMENT: 72 y.o. [place] woman status post left breast upper outer quadrant biopsy 03/08/2018 for a clinical T1c-T2 N0, stage I invasive ductal carcinoma, grade 1 or 2, estrogen and progesterone receptor positive, HER-2 not amplified, with an MIB-1 of less than 1%  (1) additional  biopsies pending  (a) right breast  (b) left breast  (2) definitive surgery to follow  (3) Oncotype DX to be obtained from the definitive surgical sample: Chemotherapy not anticipated  (4) adjuvant radiation as appropriate  (5) antiestrogens to follow at the completion of local treatment  (6) genetics testing pending  PLAN: We spent the better part of today's hour-long appointment discussing the biology of her diagnosis and the specifics of her situation. We first reviewed the fact that cancer is not one disease but more than 100 different diseases and that it is important to keep them separate-- otherwise when friends and relatives discuss their own cancer experiences with [facility] confusion can result. Similarly we explained that if breast cancer spreads to the bone or liver, the patient would not have bone cancer or liver cancer, but breast cancer in the bone and breast cancer in the liver: one cancer in three places-- not 3 different cancers which otherwise would have to be treated in 3 different ways.  We discussed the difference between local and systemic therapy. In terms  of loco-regional treatment, lumpectomy plus radiation is equivalent to mastectomy as far as survival is concerned.  Her final surgical decision of course we will have a lot to do with the additional biopsies which are being planned in both breasts.  We then discussed the rationale for systemic therapy. There is some risk that this cancer may have already spread to other parts of her body. Patients frequently ask at this point about bone scans, CAT scans and PET scans to find out if they have occult breast cancer somewhere else. The problem is that in early stage disease we are much more likely to find false positives then true cancers and this would expose the patient to unnecessary procedures as well as unnecessary radiation. Scans cannot answer the question the patient really would like to know, which is whether she has  microscopic disease elsewhere in her body. For those reasons we do not recommend them.  Of course we would proceed to aggressive evaluation of any symptoms that might suggest metastatic disease, but that is not the case here.  Next we went over the options for systemic therapy which are anti-estrogens, anti-HER-2 immunotherapy, and chemotherapy.  [name] does not meet criteria for anti-HER-2 immunotherapy. She is a good candidate for anti-estrogens.  The question of chemotherapy is more complicated. Chemotherapy is most effective in rapidly growing, aggressive tumors. It is much less effective in low-grade, slow growing cancers, like Lucillia 's. For that reason we are going to request an Oncotype from the definitive surgical sample, as suggested by NCCN guidelines. That will help [place] make a definitive decision regarding chemotherapy in this case.  The overall plan, then, is to start with additional biopsies.  If she has bilateral mastectomies she probably would not need radiation, otherwise radiation would be considered.  She does need genetics.  An Oncotype will be obtained to help US guide the chemo decision but we do not anticipate chemotherapy being needed.  She will be a good candidate for antiestrogens therapy which we will start at the completion of her local treatment.  [name] has a good understanding of the overall plan. She agrees with it. She knows the goal of treatment in her case is cure. She will call with any problems that may develop before her next visit here.  Magrinat, Virgie Dad, MD  03/21/18 10:50 AM Medical Oncology and Hematology [facility] [address] [place], [place] [address] Tel. [phone]    Fax. [phone]  This document serves as a record of services personally performed by Chauncey Cruel, MD. It was created on his behalf by Margit Banda, a trained medical scribe. The creation of this record is based on the scribe's personal observations and  the provider's statements to them.   I have reviewed the above documentation for accuracy and completeness, and I agree with the above.

## 2018-03-21 ENCOUNTER — Encounter: Payer: Self-pay | Admitting: Physical Therapy

## 2018-03-21 ENCOUNTER — Ambulatory Visit: Payer: Medicare Other | Attending: General Surgery | Admitting: Physical Therapy

## 2018-03-21 ENCOUNTER — Inpatient Hospital Stay: Payer: Medicare Other | Attending: Oncology | Admitting: Oncology

## 2018-03-21 ENCOUNTER — Encounter: Payer: Self-pay | Admitting: Radiation Oncology

## 2018-03-21 ENCOUNTER — Encounter: Payer: Self-pay | Admitting: Oncology

## 2018-03-21 ENCOUNTER — Ambulatory Visit
Admission: RE | Admit: 2018-03-21 | Discharge: 2018-03-21 | Disposition: A | Discharge status: Home / Self Care | Payer: Medicare Other | Source: Ambulatory Visit | Attending: Radiation Oncology | Admitting: Radiation Oncology

## 2018-03-21 ENCOUNTER — Encounter: Payer: Self-pay | Admitting: Medical Oncology

## 2018-03-21 ENCOUNTER — Other Ambulatory Visit: Payer: Self-pay

## 2018-03-21 ENCOUNTER — Encounter: Payer: Self-pay | Admitting: General Practice

## 2018-03-21 VITALS — BP 144/86 | HR 88 | Temp 97.7°F | Resp 18 | Ht 68.0 in | Wt 167.1 lb

## 2018-03-21 DIAGNOSIS — C50412 Malignant neoplasm of upper-outer quadrant of left female breast: Secondary | ICD-10-CM

## 2018-03-21 DIAGNOSIS — N6312 Unspecified lump in the right breast, upper inner quadrant: Secondary | ICD-10-CM | POA: Diagnosis not present

## 2018-03-21 DIAGNOSIS — Z170 Estrogen receptor positive status [ER+]: Principal | ICD-10-CM

## 2018-03-21 DIAGNOSIS — G8929 Other chronic pain: Secondary | ICD-10-CM | POA: Insufficient documentation

## 2018-03-21 DIAGNOSIS — M25662 Stiffness of left knee, not elsewhere classified: Secondary | ICD-10-CM | POA: Insufficient documentation

## 2018-03-21 DIAGNOSIS — R293 Abnormal posture: Secondary | ICD-10-CM

## 2018-03-21 DIAGNOSIS — M25562 Pain in left knee: Secondary | ICD-10-CM | POA: Diagnosis not present

## 2018-03-21 DIAGNOSIS — Z803 Family history of malignant neoplasm of breast: Secondary | ICD-10-CM | POA: Diagnosis not present

## 2018-03-21 NOTE — Progress Notes (Signed)
Nutrition Assessment  Reason for Assessment:  Pt seen in Breast Clinic  ASSESSMENT:   72 year old female with new diagnosis of breast cancer.  Past medical history reviewed.    Patient reports decreased appetite recently due to pain from knee and knee replacement.  Medications:  reviewed  Labs: none taken  Anthropometrics:   Height: 68 inches Weight: 167 lb 1.6 oz BMI: 25 7% weight loss in the last 6 month, not significant  NUTRITION DIAGNOSIS: Food and nutrition related knowledge deficit related to new diagnosis of breast cancer as evidenced by no prior need for nutrition related information.  INTERVENTION:   Discussed and provided packet of information regarding nutritional tips for breast cancer patients.  Questions answered.  Teachback method used.  Contact information provided and patient knows to contact me with questions/concerns.    MONITORING, EVALUATION, and GOAL: Pt will consume a healthy plant based diet to maintain lean body mass throughout treatment.   Volanda Mangine B. Zenia Resides, [place], [place] Registered Dietitian [phone] (pager)

## 2018-03-21 NOTE — Therapy (Signed)
[place], [place], [address] Phone: [phone]   Fax:  [phone]  Physical Therapy Evaluation  Patient Details  Name: [name] MRN: [id number] Date of Birth: 04-24-1946 Referring Provider: Dr. Rolm Bookbinder   Encounter Date: 03/21/2018  PT End of Session - 03/21/18 1517    Visit Number  1    Number of Visits  9    Date for PT Re-Evaluation  05/16/18    PT Start Time  1140    PT Stop Time  1215    PT Time Calculation (min)  35 min    Activity Tolerance  Patient tolerated treatment well    Behavior During Therapy  [facility] for tasks assessed/performed       Past Medical History:  Diagnosis Date  . Complication of anesthesia    aspiration pna; following a colonoscopy   . Constipation   . Cough   . Depression   . Dysrhythmia    RBBB on 06-06-17 ekg   . Elevated liver function tests   . Esophageal stricture   . Gallstones   . GERD (gastroesophageal reflux disease)   . Hiatal hernia   . Hyperlipemia   . Hypothyroidism   . Insomnia   . Nephrolithiasis   . Renal cyst     Past Surgical History:  Procedure Laterality Date  . CARDIOVASCULAR STRESS TEST  07/19/2006   EF 70%, NO EVIDENCE OF ISCHEMIA  . CHOLECYSTECTOMY  12/30/10  . COLONOSCOPY    . DILATION AND CURETTAGE OF UTERUS    . inguinal herniography    . NASAL SINUS SURGERY    . T & A    . TOOTH EXTRACTION    . TOTAL KNEE ARTHROPLASTY Left 09/11/2017   Procedure: LEFT TOTAL KNEE ARTHROPLASTY;  Surgeon: Gaynelle Arabian, MD;  Location: [facility];  Service: Orthopedics;  Laterality: Left;  with block  . US ECHOCARDIOGRAPHY  07/17/2006   EF 55-60%    There were no vitals filed for this visit.   Subjective Assessment - 03/21/18 1454    Subjective  Patient reports she is here today to be seen by her medical team for her newly diagnosed left breast cancer.    Patient is accompained by:  Family member    Pertinent History  Patient was  diagnoesd on 03/08/18 with lfet grade 1-2 invasive ductal carcinoma breast cancer.It measures 1.5 cm and is located in the upper outer quadrant. It is ER/PR positive and HER2 negative with a Ki67 of < 1%. She also has a finding on her MRI of a 1.1 cm area on the right breast which will be biopsied. She had a left total knee replacement in 10/18 with Dr. Wynelle Link and continues to have pain in her left lateral knee. Her orthopedist reported it is likely due to her iliotibial band.    Patient Stated Goals  For breast cancer - reduce lymphedema risk and learn post op shoulder ROM HEP; for left knee - have less pain with standing and walking, sit to stand, and descending stairs.    Currently in Pain?  Yes    Pain Score  -- Varies from 6-9/10    Pain Location  Knee    Pain Orientation  Left    Pain Descriptors / Indicators  Sharp    Pain Type  Chronic pain    Pain Onset  More than a month ago 09/2017    Pain Frequency  Intermittent    Aggravating Factors  Standing, walking, stairs    Pain Relieving Factors  Rest    Multiple Pain Sites  No         [facility] PT Assessment - 03/21/18 0001      Assessment   Medical Diagnosis  Left breast cancer; left knee pain    Referring Provider  Dr. Rolm Bookbinder    Onset Date/Surgical Date  03/08/18 03/08/18 breast cancer; 09/2017 for left knee    Hand Dominance  Right    Prior Therapy  Yes for left knee post operatively      Precautions   Precautions  Other (comment)    Precaution Comments  active breast cancer      Restrictions   Weight Bearing Restrictions  No      Balance Screen   Has the patient fallen in the past 6 months  No    Has the patient had a decrease in activity level because of a fear of falling?   No    Is the patient reluctant to leave their home because of a fear of falling?   No      Home Film[place]video [employer] residence    Living Arrangements  Spouse/significant other    Available Help at Discharge  Family       Prior Function   Level of [place]  Retired    Leisure  She rides a recumbant bike 3x/week for 30 min      Cognition   Overall Cognitive Status  Within Functional Limits for tasks assessed      Posture/Postural Control   Posture/Postural Control  Postural limitations    Postural Limitations  Rounded Shoulders;Forward head      ROM / Strength   AROM / PROM / Strength  AROM;Strength      AROM   AROM Assessment Site  Shoulder;Knee;Cervical    Right/Left Shoulder  Right;Left    Right Shoulder Extension  57 Degrees    Right Shoulder Flexion  144 Degrees    Right Shoulder ABduction  169 Degrees    Right Shoulder Internal Rotation  84 Degrees    Right Shoulder External Rotation  88 Degrees    Left Shoulder Extension  60 Degrees    Left Shoulder Flexion  141 Degrees    Left Shoulder ABduction  162 Degrees    Left Shoulder Internal Rotation  66 Degrees    Left Shoulder External Rotation  86 Degrees    Right/Left Knee  Right;Left    Right Knee Extension  0    Right Knee Flexion  130    Left Knee Extension  0    Left Knee Flexion  112    Cervical Flexion  WNL    Cervical Extension  WNL    Cervical - Right Side Bend  25% limited    Cervical - Left Side Bend  WNL    Cervical - Right Rotation  25% limited    Cervical - Left Rotation  WNL      Strength   Overall Strength  Within functional limits for tasks performed      Palpation   Patella mobility  reduced bilaterally with left worse than right.    Palpation comment  Very tender to palpation on left lateral knee at fibular head and along distal iliotibial band.        LYMPHEDEMA/ONCOLOGY QUESTIONNAIRE - 03/21/18 1515      Type   Cancer Type  Left  breast cancer      Lymphedema Assessments   Lymphedema Assessments  Upper extremities      Right Upper Extremity Lymphedema   10 cm Proximal to Olecranon Process  28.9 cm    Olecranon Process  24.9 cm    10 cm Proximal to Ulnar Styloid Process   20.8 cm    Just Proximal to Ulnar Styloid Process  14.2 cm    Across Hand at [employer]  17.4 cm    At [place] of 2nd Digit  6.4 cm      Left Upper Extremity Lymphedema   10 cm Proximal to Olecranon Process  30.7 cm    Olecranon Process  25.4 cm    10 cm Proximal to Ulnar Styloid Process  20.3 cm    Just Proximal to Ulnar Styloid Process  14.9 cm    Across Hand at [employer]  17.1 cm    At [place] of 2nd Digit  6.1 cm             Objective measurements completed on examination: See above findings.     Patient was instructed today in a home exercise program today for post op shoulder range of motion. These included active assist shoulder flexion in sitting, scapular retraction, wall walking with shoulder abduction, and hands behind head external rotation.  She was encouraged to do these twice a day, holding 3 seconds and repeating 5 times when permitted by her physician.       PT Education - 03/21/18 1516    Education provided  Yes    Education Details  Lymphedema risk reduction and post op shoulder ROM HEP    Person(s) Educated  Patient;Spouse    Methods  Explanation;Demonstration;Handout    Comprehension  Returned demonstration;Verbalized understanding          PT Long Term Goals - 03/21/18 1527      PT LONG TERM GOAL #1   Title  Patient will demonstrate she has returned ot baseline related to left shoulder ROM and function post operatively.    Time  8    Period  Weeks    Status  New      PT LONG TERM GOAL #2   Title  Decrease left knee pain to </= 4/10 with walking >/= 15 minutes.    Baseline  Pain 6/10 with walking.    Time  4    Period  Weeks      PT LONG TERM GOAL #3   Title  Decrease left knee pain to </= 5/10 with sit to stand.    Baseline  9/10    Time  4    Period  Weeks    Status  New      PT LONG TERM GOAL #4   Title  Decrease left knee pain to </= 4/10 with descending stairs.    Baseline  9/10    Time  4    Period  Weeks       Breast Clinic Goals - 03/21/18 1527      Patient will be able to verbalize understanding of pertinent lymphedema risk reduction practices relevant to her diagnosis specifically related to skin care.   Time  1    Period  Days    Status  Achieved      Patient will be able to return demonstrate and/or verbalize understanding of the post-op home exercise program related to regaining shoulder range of motion.   Time  1  Period  Days    Status  Achieved      Patient will be able to verbalize understanding of the importance of attending the postoperative After Breast Cancer Class for further lymphedema risk reduction education and therapeutic exercise.   Time  1    Period  Days    Status  Achieved            Plan - 03/21/18 1517    Clinical Impression Statement  Patient was diagnoesd on 03/08/18 with left grade 1-2 invasive ductal carcinoma breast cancer.It measures 1.5 cm and is located in the upper outer quadrant. It is ER/PR positive and HER2 negative with a Ki67 of < 1%. She also has a finding on her MRI of a 1.1 cm area on the right breast which will be biopsied. She had a left total knee replacement in 10/18 with Dr. Wynelle Link and continues to have pain in her left lateral knee. Her orthopedist reported it is likely due to her iliotibial band. Her multidisciplinary medical team met prior to her assessments to determine a recommended treatment plan. She is planning to have a left lumpectomy and sentinel node biopsy followed by Oncotype testing, radiation, and anti-estrogen therapy. She will also have an additional biopsy on the right breast. She will benefit from PT post operatively to reassess and determine her needs related to breast cancer. She will benefit from PT now to address her right knee which will hopefully allow her to begin a walking program. She will also benefit from therapeutic exercise interventions from a PT to improve her overall function.    History and Personal Factors  relevant to plan of care:  Previous TKR last year; overall deconditioned    Clinical Presentation  Evolving    Clinical Presentation due to:  Unknown extent of disease as pt requires a biopsy on the opposite breast; knee pain continues to progress and is atypical for s/p TKR    Clinical Decision Making  Moderate    Rehab Potential  Excellent    Clinical Impairments Affecting Rehab Potential  None    PT Frequency  2x / week    PT Duration  4 weeks And 1 f/u visit with PT 3-4 weeks s/p breast surgery    PT Treatment/Interventions  ADLs/Self Care Home Management;Iontophoresis '4mg'$ /ml Dexamethasone;Moist Heat;Therapeutic exercise;Therapeutic activities;Patient/family education;Passive range of motion;Scar mobilization;Manual techniques;Dry needling modalities for left knee only    PT Next Visit Plan  Left knee manual techniques to reduce tightness and scar tissue; consider dry needling    PT Home Exercise Plan  Post op shoulder ROM HEP    Consulted and Agree with Plan of Care  Patient;Family member/caregiver    Family Member Consulted  Husband       Patient will benefit from skilled therapeutic intervention in order to improve the following deficits and impairments:  Decreased knowledge of precautions, Impaired UE functional use, Decreased range of motion, Postural dysfunction, Decreased scar mobility, Decreased mobility, Pain, Increased fascial restricitons  Visit Diagnosis: Malignant neoplasm of upper-outer quadrant of left breast in female, estrogen receptor positive ([place]) - Plan: PT plan of care cert/re-cert  Abnormal posture - Plan: PT plan of care cert/re-cert  Chronic pain of left knee - Plan: PT plan of care cert/re-cert  Stiffness of left knee, not elsewhere classified - Plan: PT plan of care cert/re-cert     Problem List Patient Active Problem List   Diagnosis Date Noted  . Malignant neoplasm of upper-outer quadrant of left breast in  female, estrogen receptor positive ([place])  03/16/2018  . OA (osteoarthritis) of knee 09/11/2017  . Infrapatellar bursitis of right knee 04/29/2014  . Pneumonia, aspiration ([place]) 12/28/2012  . HYPOTHYROIDISM 11/15/2010  . HYPERLIPIDEMIA 11/15/2010  . DEPRESSION 11/15/2010  . GERD 11/15/2010  . CONSTIPATION 11/15/2010  . GALLSTONES 11/15/2010  . RENAL CYST 11/15/2010  . INSOMNIA UNSPECIFIED 11/15/2010  . COUGH 11/15/2010  . ABDOMINAL PAIN OTHER SPECIFIED SITE 11/15/2010  . TRANSAMINASES, SERUM, ELEVATED 11/15/2010    Annia Friendly, PT 03/21/18 3:52 PM  [place], [place], [address] Phone: [phone]   Fax:  [phone]  Name: LOMETA RIGGIN MRN: [id number] Date of Birth: [birth year]

## 2018-03-21 NOTE — Patient Instructions (Signed)

## 2018-03-21 NOTE — Progress Notes (Signed)
Radiation Oncology         [phone] ________________________________  Initial outpatient Consultation  Name: [name] MRN: [id number]  Date: 03/21/2018  DOB: November 08, 1946  HY:WVPXT, Jenny Reichmann, MD  Rolm Bookbinder, MD   REFERRING PHYSICIAN: Rolm Bookbinder, MD  DIAGNOSIS:    ICD-10-CM   1. Malignant neoplasm of upper-outer quadrant of left breast in female, estrogen receptor positive ([place]) C50.412    Z17.0    Stage  IA T1c N0  M0 Left Breast UOQ Invasive Ductal Carcinoma with DCIS, ER+ / PR+ / Her2-, Grade 1-2  CHIEF COMPLAINT: Here to discuss management of left breast cancer  HISTORY OF PRESENT ILLNESS::[name] is a 72 y.o. female who presented with an architectural distortion in the left breast on diagnostic mammogram on 03/08/18. Ultrasound on 03/08/18 showed a 1.5 cm irregular mass at the 2 o'clock position in the left breast consistent with carcinoma and is highly suggestive of malignancy. Left axilla was negative. Biopsy on 03/08/18 showed invasive ductal carcinoma, grade I-II with focal ductal carcinoma in situ with characteristics as described above in the diagnosis. Receptor status was ER 100%, PR 100%, Her-2 negative, Ki67 <1%.   MRI of the bilateral breasts was performed on 03/18/18. In the left breast there was a 3.3 cm mass and linear enhancement in the posterior aspect of the upper-outer quadrant for the left breast corresponding with the biopsy proven invasive ductal carcinoma and ductal carcinoma in-situ. A 1.1 cm mass in the upper-outer quadrant of the left breast located 2.1 cm anterior and lateral to the biopsy proven malignancy. In the right breast there were two indeterminate 5 mm adjacent enhancing nodules in the upper-inner quadrant of the right breast. The patient presents to breast clinic today for evaluation of radiotherapy as part of her treatment plan.  Patient reports fatigue affecting her activities, a throbbing pain to her knee, change in  vision, ringing in ears, sinus problems, shortness of breath with stairs, sleeping on two pillows, coughing, nausea/vomiting, dribbling urine, skin cancer, back pain, joint pain, depression, and hot flashes.  Gynecologic History: Age of first menstrual period: 71 Are you still having periods? No Approximate date of last period: 1997 If you no longer have periods, have you used hormone replacement: Yes If yes, for how long? 6-7 years When did you stop? Do not remember   PAST MEDICAL HISTORY:  has a past medical history of Complication of anesthesia, Constipation, Cough, Depression, Dysrhythmia, Elevated liver function tests, Esophageal stricture, Gallstones, GERD (gastroesophageal reflux disease), Hiatal hernia, Hyperlipemia, Hypothyroidism, Insomnia, Nephrolithiasis, and Renal cyst.    PAST SURGICAL HISTORY: Past Surgical History:  Procedure Laterality Date  . CARDIOVASCULAR STRESS TEST  07/19/2006   EF 70%, NO EVIDENCE OF ISCHEMIA  . CHOLECYSTECTOMY  12/30/10  . COLONOSCOPY    . DILATION AND CURETTAGE OF UTERUS    . inguinal herniography    . NASAL SINUS SURGERY    . T & A    . TOOTH EXTRACTION    . TOTAL KNEE ARTHROPLASTY Left 09/11/2017   Procedure: LEFT TOTAL KNEE ARTHROPLASTY;  Surgeon: Gaynelle Arabian, MD;  Location: [facility];  Service: Orthopedics;  Laterality: Left;  with block  . US ECHOCARDIOGRAPHY  07/17/2006   EF 55-60%    FAMILY HISTORY: family history includes Breast cancer in her mother; Dementia in her father.  SOCIAL HISTORY:  reports that she has never smoked. She has never used smokeless tobacco. She reports that she does not drink alcohol or use drugs.  ALLERGIES: Oxycontin [oxycodone hcl] and Codeine  MEDICATIONS:  Current Outpatient Medications  Medication Sig Dispense Refill  . azelastine (ASTELIN) 0.1 % nasal spray Place 1 spray into both nostrils 2 (two) times daily. Use in each nostril as directed    . Cholecalciferol (VITAMIN D3) 1000 units CAPS Take 1  capsule by mouth daily.    Marland Kitchen escitalopram (LEXAPRO) 10 MG tablet Take 15 mg by mouth daily.     Marland Kitchen esomeprazole (NEXIUM) 40 MG capsule Take 40 mg by mouth 2 (two) times daily before a meal.    . ezetimibe (ZETIA) 10 MG tablet Take 10 mg by mouth at bedtime.    . fluticasone (FLONASE) 50 MCG/ACT nasal spray Place 1 spray into both nostrils daily.    Marland Kitchen levothyroxine (SYNTHROID, LEVOTHROID) 50 MCG tablet Take 50 mcg by mouth at bedtime.    . Magnesium 250 MG TABS Take 1 tablet by mouth daily.     . Probiotic Product (PROBIOTIC ADVANCED PO) Take 1 tablet by mouth daily.    . rosuvastatin (CRESTOR) 5 MG tablet Take 5 mg by mouth at bedtime.     . sodium chloride (OCEAN) 0.65 % SOLN nasal spray Place 4 sprays into both nostrils 4 (four) times daily.    . SUPER B COMPLEX/C PO Take 1 tablet by mouth daily.     No current facility-administered medications for this encounter.     REVIEW OF SYSTEMS: A 10+ POINT REVIEW OF SYSTEMS WAS OBTAINED including neurology, dermatology, psychiatry, cardiac, respiratory, lymph, extremities, GI, GU, Musculoskeletal, constitutional, breasts, reproductive, HEENT.  All pertinent positives are noted in the HPI.  All others are negative.   PHYSICAL EXAM:  vitals were not taken for this visit.    General: Alert and oriented, in no acute distress HEENT: Head is normocephalic. Extraocular movements are intact. Oropharynx is clear. Neck: Neck is supple, no palpable cervical or supraclavicular lymphadenopathy. Heart: Regular in rate and rhythm with no murmurs, rubs, or gallops. Chest: Clear to auscultation bilaterally, with no rhonchi, wheezes, or rales. Abdomen: Soft, nontender, nondistended, with no rigidity or guarding. Extremities: No cyanosis or edema. Lymphatics: see Neck Exam Skin: No concerning lesions. Musculoskeletal: symmetric strength and muscle tone throughout. Neurologic: Cranial nerves II through XII are grossly intact. No obvious focalities. Speech is  fluent.   Slight resting tremor of her head. Finger to nose testing is slightly inaccurate.  Psychiatric: Judgment and insight are intact. Affect is appropriate. Breasts: Upper outer quadrant of the left breast there is an area of post-biopsy firmness measuring approximately 2.5 cm. No other palpable masses appreciated in the breasts or axillae.  ECOG = 1  0 - Asymptomatic (Fully active, able to carry on all predisease activities without restriction)  1 - Symptomatic but completely ambulatory (Restricted in physically strenuous activity but ambulatory and able to carry out work of a light or sedentary nature. For example, light housework, office work)  2 - Symptomatic, <50% in bed during the day (Ambulatory and capable of all self care but unable to carry out any work activities. Up and about more than 50% of waking hours)  3 - Symptomatic, >50% in bed, but not bedbound (Capable of only limited self-care, confined to bed or chair 50% or more of waking hours)  4 - Bedbound (Completely disabled. Cannot carry on any self-care. Totally confined to bed or chair)  5 - Death   Eustace Pen MM, Creech RH, Tormey DC, et al. [phone]). "Toxicity and response criteria of the [employer]  [employer] Group". [place] Oncol. 5 (6): 649-55   LABORATORY DATA:  Lab Results  Component Value Date   WBC 22.4 (H) 09/13/2017   HGB 10.2 (L) 09/13/2017   HCT 31.8 (L) 09/13/2017   MCV 90.9 09/13/2017   PLT 276 09/13/2017   CMP     Component Value Date/Time   NA 141 09/13/2017 0528   NA 144 06/06/2017 1448   K 4.1 09/13/2017 0528   CL 107 09/13/2017 0528   CO2 29 09/13/2017 0528   GLUCOSE 112 (H) 09/13/2017 0528   BUN 19 09/13/2017 0528   BUN 12 06/06/2017 1448   CREATININE 0.64 09/13/2017 0528   CALCIUM 9.2 09/13/2017 0528   PROT 7.0 09/04/2017 1530   PROT 6.3 06/06/2017 1448   ALBUMIN 4.3 09/04/2017 1530   ALBUMIN 4.4 06/06/2017 1448   AST 44 (H) 09/04/2017 1530   ALT 46 09/04/2017 1530    ALKPHOS 94 09/04/2017 1530   BILITOT 0.3 09/04/2017 1530   BILITOT 0.3 06/06/2017 1448   GFRNONAA >60 09/13/2017 0528   GFRAA >60 09/13/2017 0528         RADIOGRAPHY: Mr Breast Bilateral W Wo Contrast Inc Cad  Result Date: 03/19/2018 CLINICAL DATA:  Biopsy proven invasive ductal carcinoma and ductal carcinoma in-situ in the 2 o'clock region of the left breast. LABS:  Creatinine was obtained on site at [facility] at [address] Results: Creatinine 0.8 mg/dL. EXAM: BILATERAL BREAST MRI WITH AND WITHOUT CONTRAST TECHNIQUE: Multiplanar, multisequence MR images of both breasts were obtained prior to and following the intravenous administration of 16 ml of MultiHance. THREE-DIMENSIONAL MR IMAGE RENDERING ON INDEPENDENT WORKSTATION: Three-dimensional MR images were rendered by post-processing of the original MR data on an independent workstation. The three-dimensional MR images were interpreted, and findings are reported in the following complete MRI report for this study. Three dimensional images were evaluated at the independent DynaCad workstation COMPARISON:  Previous exam(s). FINDINGS: Breast composition: d. Extreme fibroglandular tissue. Background parenchymal enhancement: Moderate. Right breast: In the anterior third of the upper inner quadrant of the right breast are 2 adjacent enhancing 5 mm nodules that are indeterminate (series #11, image 96/144). Left breast: There is an irregular enhancing mass in the far posterior aspect of the upper outer quadrant of the left breast measuring 1.3 x 1.5 x 1.7 cm in anterior posterior, transverse and craniocaudal dimensions. Linear enhancement extends laterally from the inferior margin of the mass. The mass and the linear enhancement together measure 3.3 cm. There is a signal void artifact at the inferior and lateral margin of the mass corresponding with the biopsy clip. 2.1 cm anterior and lateral to the biopsy proven malignancy is a 1.1 x 0.5 x 0.8  cm enhancing mass (series #11 image 87/144). Lymph nodes: No abnormal appearing lymph nodes. Ancillary findings:  None. IMPRESSION: Left breast: 1. 3.3 cm mass and linear enhancement in the posterior aspect of the upper-outer quadrant of the left breast corresponding with the biopsy proven invasive ductal carcinoma and ductal carcinoma in-situ. 2. 1.1 cm mass in the upper-outer quadrant of the left breast located 2.1 cm anterior and lateral to the biopsy proven malignancy. Right breast: 1. Two indeterminate 5 mm adjacent enhancing nodules in the upper-inner quadrant of the right breast. RECOMMENDATION: MR guided core biopsies of the 1.1 cm mass in the upper-outer quadrant of the left breast and 2 adjacent enhancing nodules in the upper-inner quadrant of the right breast recommended. BI-RADS CATEGORY  4: Suspicious. Electronically  Signed   By: Lillia Mountain M.D.   On: 03/19/2018 14:09      IMPRESSION/PLAN: Stage T1c N0 M0 Left Breast UOQ Invasive Ductal Carcinoma with DCIS, ER+ / PR+ / Her2-, Grade 1-2. Given the MRI findings, the patient will be scheduled for biopsy of the right breast nodules. The patient will also be scheduled for a second ultrasound of the left breast for additionally noted 1.1 cm mass, as well as ultrasound of the right breast. Pending results of ultrasound/further bx's, the patient will be scheduled for either left mastectomy or left lumpectomy with sentinel lymph node biopsy. The patient would prefer breast conserving therapy if possible. This will be followed by radiation therapy in the setting of lumpectomy and then followed by an AI for approximately 5 years.   The patient is scheduled for genetic testing on 03/22/18. She will also be scheduled for oncotype testing.  It was a pleasure meeting the patient today. We discussed the risks, benefits, and side effects of radiotherapy.  We discussed that radiation would take approximately 4 weeks to complete and that I would give the patient a  few weeks to heal following surgery before starting treatment planning. We spoke about acute effects including skin irritation and fatigue as well as much less common late effects including internal organ injury or irritation. We spoke about the latest technology that is used to minimize the risk of late effects for patients undergoing radiotherapy to the breast or chest wall. No guarantees of treatment were given. The patient is enthusiastic about proceeding with treatment as needed. I look forward to participating in the patient's care.  I did discuss with the patient that radiation would be less likely if she undergoes mastectomy. We reviewed the indications for post-mastectomy radiation. In the setting of a lumpectomy, I think radiation would provide a benefit for local control. I will see her back as needed in my clinic.     __________________________________________   Eppie Gibson, MD   This document serves as a record of services personally performed by Eppie Gibson, MD. It was created on her behalf by Bethann Humble, a trained medical scribe. The creation of this record is based on the scribe's personal observations and the provider's statements to them. This document has been checked and approved by the attending provider.

## 2018-03-21 NOTE — Progress Notes (Signed)
[facility] Psychosocial Distress Screening Clinical Social Work  Clinical Social Work was referred by distress screening protocol.  The patient scored a 3 on the Psychosocial Distress Thermometer which indicates mild distress. Clinical Social Worker Edwyna Shell to assess for distress and other psychosocial needs. CSW and patient discussed common feeling and emotions when being diagnosed with cancer, and the importance of support during treatment. CSW informed patient of the support team and support services at [facility]. CSW provided contact information and encouraged patient to call with any questions or concerns.  Is generally "doing OK, Im old and have had a lot of health issues to deal with."  Lives w "wonderful husband who helps a lot."  Requested information packet on [facility] which CSW mailed, will contact as needed for support and resources.    ONCBCN DISTRESS SCREENING 03/21/2018  Screening Type Initial Screening  Distress experienced in past week (1-10) 3  Emotional problem type Nervousness/Anxiety  Other contact at [phone]    Clinical Social Worker follow up needed: No.  If yes, follow up plan:   Edwyna Shell, LCSW Clinical Social Worker Phone:  [phone]

## 2018-03-22 ENCOUNTER — Other Ambulatory Visit: Payer: Medicare Other

## 2018-03-22 ENCOUNTER — Other Ambulatory Visit: Payer: Self-pay | Admitting: Radiology

## 2018-03-22 ENCOUNTER — Encounter: Payer: Self-pay | Admitting: Physical Therapy

## 2018-03-22 ENCOUNTER — Telehealth: Payer: Self-pay | Admitting: Oncology

## 2018-03-22 ENCOUNTER — Inpatient Hospital Stay (HOSPITAL_BASED_OUTPATIENT_CLINIC_OR_DEPARTMENT_OTHER): Payer: Medicare Other | Admitting: Genetic Counselor

## 2018-03-22 ENCOUNTER — Inpatient Hospital Stay: Payer: Medicare Other

## 2018-03-22 ENCOUNTER — Ambulatory Visit: Payer: Medicare Other | Attending: General Surgery | Admitting: Physical Therapy

## 2018-03-22 ENCOUNTER — Encounter: Payer: Self-pay | Admitting: Genetic Counselor

## 2018-03-22 DIAGNOSIS — G8929 Other chronic pain: Secondary | ICD-10-CM | POA: Diagnosis not present

## 2018-03-22 DIAGNOSIS — M25662 Stiffness of left knee, not elsewhere classified: Secondary | ICD-10-CM | POA: Diagnosis not present

## 2018-03-22 DIAGNOSIS — M25562 Pain in left knee: Secondary | ICD-10-CM | POA: Diagnosis not present

## 2018-03-22 DIAGNOSIS — N6321 Unspecified lump in the left breast, upper outer quadrant: Secondary | ICD-10-CM | POA: Diagnosis not present

## 2018-03-22 DIAGNOSIS — Z803 Family history of malignant neoplasm of breast: Secondary | ICD-10-CM

## 2018-03-22 DIAGNOSIS — Z170 Estrogen receptor positive status [ER+]: Secondary | ICD-10-CM

## 2018-03-22 DIAGNOSIS — C50912 Malignant neoplasm of unspecified site of left female breast: Secondary | ICD-10-CM | POA: Diagnosis not present

## 2018-03-22 DIAGNOSIS — C50412 Malignant neoplasm of upper-outer quadrant of left female breast: Secondary | ICD-10-CM | POA: Diagnosis not present

## 2018-03-22 NOTE — Patient Instructions (Addendum)
Chair Sitting    Sit at edge of seat, spine straight, one leg extended. Put a hand on each thigh and bend forward from the hip, keeping spine straight. Allow hand on extended leg to reach toward toes. Support upper body with other arm. Hold __30_ seconds. Repeat _2__ times per session. Do __2_ sessions per day.  Copyright  VHI. All rights reserved.  Piriformis Stretch, Sitting    Sit, one ankle on opposite knee, same-side hand on crossed knee. Push down on knee, keeping spine straight. Lean torso forward, with flat back, until tension is felt in hamstrings and gluteals of crossed-leg side. Hold _30__ seconds.  Repeat __2_ times per session. Do _2__ sessions per day.  Copyright  VHI. All rights reserved.  Calf Stretch    Place one leg forward, bent, other leg behind and straight. Lean forward keeping back heel flat. Hold __30__ seconds while counting out loud. Repeat with other leg forward. Repeat __2__ times. Do _2___ sessions per day.Then do with left knee bent hold 30 seconds 2 times.   http://gt2.exer.us/478   Copyright  VHI. All rights reserved.  IONTOPHORESIS PATIENT PRECAUTIONS & CONTRAINDICATIONS:  . Redness under one or both electrodes can occur.  This characterized by a uniform redness that usually disappears within 12 hours of treatment. . Small pinhead size blisters may result in response to the drug.  Contact your physician if the problem persists more than 24 hours. . On rare occasions, iontophoresis therapy can result in temporary skin reactions such as rash, inflammation, irritation or burns.  The skin reactions may be the result of individual sensitivity to the ionic solution used, the condition of the skin at the start of treatment, reaction to the materials in the electrodes, allergies or sensitivity to dexamethasone, or a poor connection between the patch and your skin.  Discontinue using iontophoresis if you have any of these reactions and report to your  therapist. . Remove the Patch or electrodes if you have any undue sensation of pain or burning during the treatment and report discomfort to your therapist. . Tell your Therapist if you have had known adverse reactions to the application of electrical current. . If using the Patch, the LED light will turn off when treatment is complete and the patch can be removed.  Approximate treatment time is 1-3 hours.  Remove the patch when light goes off or after 6 hours. . The Patch can be worn during normal activity, however excessive motion where the electrodes have been placed can cause poor contact between the skin and the electrode or uneven electrical current resulting in greater risk of skin irritation. Marland Kitchen Keep out of the reach of children.   . DO NOT use if you have a cardiac pacemaker or any other electrically sensitive implanted device. . DO NOT use if you have a known sensitivity to dexamethasone. . DO NOT use during Magnetic Resonance Imaging (MRI). . DO NOT use over broken or compromised skin (e.g. sunburn, cuts, or acne) due to the increased risk of skin reaction. . DO NOT SHAVE over the area to be treated:  To establish good contact between the Patch and the skin, excessive hair may be clipped. . DO NOT place the Patch or electrodes on or over your eyes, directly over your heart, or brain. . DO NOT reuse the Patch or electrodes as this may cause burns to occur. HIP: Extension / KNEE: Flexion - Prone    Bend knee, squeeze glutes. Raise leg up. _10__ reps  per set, _1__ sets per day, _1__ days per week   Copyright  VHI. All rights reserved.  [facility] [address] [place], [place] [address] Phone # [phone] Fax [phone]

## 2018-03-22 NOTE — Progress Notes (Signed)
[facility]      Initial Visit   Patient Name: [name] Patient DOB: 11-Oct-1946 Patient Age: 72 y.o. Encounter Date: 03/22/2018  Referring Provider: Lurline Del, MD  Primary Care Provider: Shon Baton, MD  Reason for Visit: Evaluate for hereditary susceptibility to cancer    Assessment and Plan:  . [name] history is not highly suggestive of a hereditary predisposition to cancer, but she meets Medicare's criteria for testing given her personal history of breast cancer in combination with 2 relatives with breast cancer, regardless of ages at diagnosis.   . Testing is recommended to determine whether she has a pathogenic mutation that will impact her screening and risk-reduction for cancer. A negative result will be reassuring.  . Ms. Morell wished to pursue genetic testing and a blood sample will be sent to [facility] for analysis. [facility]'s STAT breast panel was requested as it will impact surgical decisions. Results should be available in about 7-12 days. The 9 genes on this panel are ATM, BRCA1, BRCA2, CDH1, CHEK2, PALB2, PTEN, STK11, TP53. Once this test is complete, analysis of additional genes on a larger hereditary cancer panel will proceed. She will be called after each result is obtained.   Dr. Jana Hakim was available for questions concerning this case. Total time spent by me in face-to-face counseling was approximately 30 minutes.   _____________________________________________________________________   History of Present Illness: [name], a 72 y.o. female, is being seen at the [facility] due to a personal and family history of cancer. She presents to clinic today with her husband to discuss the possibility of a hereditary predisposition to cancer and discuss whether genetic testing is warranted.  [name] was recently diagnosed with breast cancer at the age of 77. She indicated that she will be using  results of genetic testing to decide which surgery to have, but is always pending MRI-guided biopsies on her left breast.   Past Medical History:  Diagnosis Date  . Complication of anesthesia    aspiration pna; following a colonoscopy   . Constipation   . Cough   . Depression   . Dysrhythmia    RBBB on 06-06-17 ekg   . Elevated liver function tests   . Esophageal stricture   . Family history of breast cancer   . Gallstones   . GERD (gastroesophageal reflux disease)   . Hiatal hernia   . Hyperlipemia   . Hypothyroidism   . Insomnia   . Malignant neoplasm of upper-outer quadrant of left female breast ([place])   . Nephrolithiasis   . Renal cyst     Past Surgical History:  Procedure Laterality Date  . CARDIOVASCULAR STRESS TEST  07/19/2006   EF 70%, NO EVIDENCE OF ISCHEMIA  . CHOLECYSTECTOMY  12/30/10  . COLONOSCOPY    . DILATION AND CURETTAGE OF UTERUS    . inguinal herniography    . NASAL SINUS SURGERY    . T & A    . TOOTH EXTRACTION    . TOTAL KNEE ARTHROPLASTY Left 09/11/2017   Procedure: LEFT TOTAL KNEE ARTHROPLASTY;  Surgeon: Gaynelle Arabian, MD;  Location: [facility];  Service: Orthopedics;  Laterality: Left;  with block  . US ECHOCARDIOGRAPHY  07/17/2006   EF 55-60%    Social History   Socioeconomic History  . Marital status: Married    Spouse name: Not on file  . Number of children: 2  . Years of  education: Not on file  . Highest education level: Not on file  Occupational History  . Occupation: Retired    Fish farm manager: [employer]  Social Needs  . Financial resource strain: Not on file  . Food insecurity:    Worry: Not on file    Inability: Not on file  . Transportation needs:    Medical: Not on file    Non-medical: Not on file  Tobacco Use  . Smoking status: Never Smoker  . Smokeless tobacco: Never Used  Substance and Sexual Activity  . Alcohol use: No  . Drug use: No  . Sexual activity: Not on file  Lifestyle  . Physical activity:    Days per week: Not on  file    Minutes per session: Not on file  . Stress: Not on file  Relationships  . Social connections:    Talks on phone: Not on file    Gets together: Not on file    Attends religious service: Not on file    Active member of club or organization: Not on file    Attends meetings of clubs or organizations: Not on file    Relationship status: Not on file  Other Topics Concern  . Not on file  Social History Narrative  . Not on file     Family History:  During the visit, a 4-generation pedigree was obtained. Family tree will be scanned in the Media tab in Epic  Significant diagnoses include the following:  Family History  Problem Relation Age of Onset  . Breast cancer Mother 96       deceased 53; TAH/BSO in 12s/50s  . Dementia Father        deceased 64  . Breast cancer Maternal Aunt 82       deceased 39s  . Colon cancer Neg Hx   . Stomach cancer Neg Hx     Additionally, [name] has two adopted children. She has a sister (age 20) and a brother (age 80). Her mother (noted above) had 5 brothers and 2 sisters. Her father died at 54. He had 3 sisters and a brother.  [name] ancestry is Caucasian - NOS. There is no known Jewish ancestry and no consanguinity.  Discussion: We reviewed the characteristics, features and inheritance patterns of hereditary cancer syndromes. We discussed her risk of harboring a mutation in the context of her personal and family history. We discussed the process of genetic testing, insurance coverage and implications of results: positive, negative and variant of unknown significance (VUS).    Ms. Bonneau questions were answered to her satisfaction today and she is welcome to call with any additional questions or concerns. Thank you for the referral and allowing [place] to share in the care of your patient.    Steele Berg, MS, [place] Certified Genetic Counselor phone: [phone] [email or web address]

## 2018-03-22 NOTE — Telephone Encounter (Signed)
Per 4/10 no los

## 2018-03-22 NOTE — Therapy (Signed)
[facility] [address], [place], [place], [address] Phone: [phone]   Fax:  [phone]  Physical Therapy Treatment  Patient Details  Name: [name] MRN: [id number] Date of Birth: Mar 05, 1946 Referring Provider: Dr. Rolm Bookbinder   Encounter Date: 03/22/2018  PT End of Session - 03/22/18 1408    Visit Number  2    Date for PT Re-Evaluation  05/16/18    Authorization Type  medicare/tricare    PT Start Time  1100    PT Stop Time  1145    PT Time Calculation (min)  45 min    Activity Tolerance  Patient tolerated treatment well;No increased pain    Behavior During Therapy  WFL for tasks assessed/performed       Past Medical History:  Diagnosis Date  . Complication of anesthesia    aspiration pna; following a colonoscopy   . Constipation   . Cough   . Depression   . Dysrhythmia    RBBB on 06-06-17 ekg   . Elevated liver function tests   . Esophageal stricture   . Gallstones   . GERD (gastroesophageal reflux disease)   . Hiatal hernia   . Hyperlipemia   . Hypothyroidism   . Insomnia   . Nephrolithiasis   . Renal cyst     Past Surgical History:  Procedure Laterality Date  . CARDIOVASCULAR STRESS TEST  07/19/2006   EF 70%, NO EVIDENCE OF ISCHEMIA  . CHOLECYSTECTOMY  12/30/10  . COLONOSCOPY    . DILATION AND CURETTAGE OF UTERUS    . inguinal herniography    . NASAL SINUS SURGERY    . T & A    . TOOTH EXTRACTION    . TOTAL KNEE ARTHROPLASTY Left 09/11/2017   Procedure: LEFT TOTAL KNEE ARTHROPLASTY;  Surgeon: Gaynelle Arabian, MD;  Location: [facility];  Service: Orthopedics;  Laterality: Left;  with block  . US ECHOCARDIOGRAPHY  07/17/2006   EF 55-60%    There were no vitals filed for this visit.  Subjective Assessment - 03/22/18 1107    Subjective  I have pain with getting up and down from a chair, stairs and especially down stairs.  I had my knee surger 09/11/2017.      Patient is accompained  by:  Family member husband    Pertinent History  Patient was diagnoesd on 03/08/18 with lfet grade 1-2 invasive ductal carcinoma breast cancer.It measures 1.5 cm and is located in the upper outer quadrant. It is ER/PR positive and HER2 negative with a Ki67 of < 1%. She also has a finding on her MRI of a 1.1 cm area on the right breast which will be biopsied. She had a left total knee replacement in 10/18 with Dr. Wynelle Link and continues to have pain in her left lateral knee. Her orthopedist reported it is likely due to her iliotibial band.    Patient Stated Goals  For breast cancer - reduce lymphedema risk and learn post op shoulder ROM HEP; for left knee - have less pain with standing and walking, sit to stand, and descending stairs.    Currently in Pain?  Yes    Pain Score  5     Pain Location  Knee    Pain Orientation  Left    Pain Descriptors / Indicators  Sharp    Pain Type  Chronic pain    Pain Onset  More than a month ago    Pain Frequency  Constant  Aggravating Factors   standing, stairs, walking    Pain Relieving Factors  rest    Multiple Pain Sites  No         [facility] PT Assessment - 03/22/18 0001      Palpation   Palpation comment  tenderness located in left calf,         LYMPHEDEMA/ONCOLOGY QUESTIONNAIRE - 03/21/18 1515      Type   Cancer Type  Left breast cancer      Lymphedema Assessments   Lymphedema Assessments  Upper extremities      Right Upper Extremity Lymphedema   10 cm Proximal to Olecranon Process  28.9 cm    Olecranon Process  24.9 cm    10 cm Proximal to Ulnar Styloid Process  20.8 cm    Just Proximal to Ulnar Styloid Process  14.2 cm    Across Hand at [employer]  17.4 cm    At [place] of 2nd Digit  6.4 cm      Left Upper Extremity Lymphedema   10 cm Proximal to Olecranon Process  30.7 cm    Olecranon Process  25.4 cm    10 cm Proximal to Ulnar Styloid Process  20.3 cm    Just Proximal to Ulnar Styloid Process  14.9 cm    Across Hand at [employer]  17.1 cm    At [place] of 2nd Digit  6.1 cm                [facility] Adult PT Treatment/Exercise - 03/22/18 0001      Exercises   Exercises  Knee/Hip      Knee/Hip Exercises: Stretches   Active Hamstring Stretch  Left;2 reps;30 seconds      Knee/Hip Exercises: Prone   Hip Extension  Strengthening;Left;AAROM;15 reps    Hip Extension Limitations  tactile cues to contract the gluteal prior to raising the left leg and keeping the pelvis down      Modalities   Modalities  Iontophoresis      Iontophoresis   Type of Iontophoresis  Dexamethasone    Location  lateral left knee    Dose  1 ml, #1 [id number]      Manual Therapy   Manual Therapy  Joint mobilization;Soft tissue mobilization    Joint Mobilization  fibula head mobilization, anterior glide of the left hip joint grade 3    Soft tissue mobilization  left gastroc, soleus, iliotibial band, hamstring, quads             PT Education - 03/22/18 1408    Education provided  Yes    Education Details  stretches, information on iontophoresis    Person(s) Educated  Patient;Spouse    Methods  Explanation;Demonstration;Verbal cues;Handout    Comprehension  Returned demonstration;Verbalized understanding          PT Long Term Goals - 03/21/18 1527      PT LONG TERM GOAL #1   Title  Patient will demonstrate she has returned ot baseline related to left shoulder ROM and function post operatively.    Time  8    Period  Weeks    Status  New      PT LONG TERM GOAL #2   Title  Decrease left knee pain to </= 4/10 with walking >/= 15 minutes.    Baseline  Pain 6/10 with walking.    Time  4    Period  Weeks      PT LONG  TERM GOAL #3   Title  Decrease left knee pain to </= 5/10 with sit to stand.    Baseline  9/10    Time  4    Period  Weeks    Status  New      PT LONG TERM GOAL #4   Title  Decrease left knee pain to </= 4/10 with descending stairs.    Baseline  9/10    Time  4    Period  Weeks      Breast  Clinic Goals - 03/21/18 1527      Patient will be able to verbalize understanding of pertinent lymphedema risk reduction practices relevant to her diagnosis specifically related to skin care.   Time  1    Period  Days    Status  Achieved      Patient will be able to return demonstrate and/or verbalize understanding of the post-op home exercise program related to regaining shoulder range of motion.   Time  1    Period  Days    Status  Achieved      Patient will be able to verbalize understanding of the importance of attending the postoperative After Breast Cancer Class for further lymphedema risk reduction education and therapeutic exercise.   Time  1    Period  Days    Status  Achieved           Plan - 03/22/18 1409    Clinical Impression Statement  Patient reports her pain decreased to 1/10 after therapy.  Patient was able to ambulate with more of a heel toe gait and increased left hip extension.  Patient was concerned about her pain and wanted to focus on this today.  Once her pain is down we can work on [employer].  Patient is drained by her left knee pain.  Patient left hip does not glide anteriolry making it difficulty to fully extend her left hip with gait but after manual work better gluteal contraction. Patient will benefit from skilled therapy to address left knee pain so she will be able to participate in prehab program.     Rehab Potential  Excellent    Clinical Impairments Affecting Rehab Potential  None    PT Frequency  2x / week    PT Duration  4 weeks add 1 f/u visit with 3-4 weeks s/p breast surgery    PT Treatment/Interventions  ADLs/Self Care Home Management;Iontophoresis '4mg'$ /ml Dexamethasone;Moist Heat;Therapeutic exercise;Therapeutic activities;Patient/family education;Passive range of motion;Scar mobilization;Manual techniques;Dry needling modlaities for right knee    PT Next Visit Plan  Left knee manual techniques to reduce tightness and scar tissue; consider dry  needling; ionto #2    PT Home Exercise Plan  work on hip extension    Recommended Other Services  MD signed initial summary    Consulted and Agree with Plan of Care  Patient;Family member/caregiver    Family Member Consulted  Husband       Patient will benefit from skilled therapeutic intervention in order to improve the following deficits and impairments:  Decreased knowledge of precautions, Impaired UE functional use, Decreased range of motion, Postural dysfunction, Decreased scar mobility, Decreased mobility, Pain, Increased fascial restricitons  Visit Diagnosis: Chronic pain of left knee  Stiffness of left knee, not elsewhere classified     Problem List Patient Active Problem List   Diagnosis Date Noted  . Malignant neoplasm of upper-outer quadrant of left breast in female, estrogen receptor positive ([place]) 03/16/2018  . OA (osteoarthritis)  of knee 09/11/2017  . Infrapatellar bursitis of right knee 04/29/2014  . Pneumonia, aspiration ([place]) 12/28/2012  . HYPOTHYROIDISM 11/15/2010  . HYPERLIPIDEMIA 11/15/2010  . DEPRESSION 11/15/2010  . GERD 11/15/2010  . CONSTIPATION 11/15/2010  . GALLSTONES 11/15/2010  . RENAL CYST 11/15/2010  . INSOMNIA UNSPECIFIED 11/15/2010  . COUGH 11/15/2010  . ABDOMINAL PAIN OTHER SPECIFIED SITE 11/15/2010  . TRANSAMINASES, SERUM, ELEVATED 11/15/2010    Earlie Counts, PT 03/22/18 2:17 PM   [facility] [address] [place], [place], [address] Phone: [phone]   Fax:  [phone]  Name: [name] MRN: [id number] Date of Birth: 1946-08-05

## 2018-03-23 ENCOUNTER — Other Ambulatory Visit: Payer: Self-pay | Admitting: Radiology

## 2018-03-26 ENCOUNTER — Encounter: Payer: Self-pay | Admitting: Physical Therapy

## 2018-03-26 ENCOUNTER — Ambulatory Visit: Payer: Medicare Other | Admitting: Physical Therapy

## 2018-03-26 DIAGNOSIS — G8929 Other chronic pain: Secondary | ICD-10-CM | POA: Diagnosis not present

## 2018-03-26 DIAGNOSIS — M25662 Stiffness of left knee, not elsewhere classified: Secondary | ICD-10-CM | POA: Diagnosis not present

## 2018-03-26 DIAGNOSIS — M25562 Pain in left knee: Principal | ICD-10-CM

## 2018-03-26 NOTE — Therapy (Signed)
[facility] [address], [place], [place], [address] Phone: [phone]   Fax:  [phone]  Physical Therapy Treatment  Patient Details  Name: [name] MRN: [id number] Date of Birth: 12-09-1946 Referring Provider: Dr. Rolm Bookbinder   Encounter Date: 03/26/2018  PT End of Session - 03/26/18 1239    Visit Number  3    Date for PT Re-Evaluation  05/16/18    Authorization Type  medicare/tricare    PT Start Time  1145    PT Stop Time  1230    PT Time Calculation (min)  45 min    Activity Tolerance  Patient tolerated treatment well;No increased pain    Behavior During Therapy  WFL for tasks assessed/performed       Past Medical History:  Diagnosis Date  . Complication of anesthesia    aspiration pna; following a colonoscopy   . Constipation   . Cough   . Depression   . Dysrhythmia    RBBB on 06-06-17 ekg   . Elevated liver function tests   . Esophageal stricture   . Family history of breast cancer   . Gallstones   . GERD (gastroesophageal reflux disease)   . Hiatal hernia   . Hyperlipemia   . Hypothyroidism   . Insomnia   . Malignant neoplasm of upper-outer quadrant of left female breast ([place])   . Nephrolithiasis   . Renal cyst     Past Surgical History:  Procedure Laterality Date  . CARDIOVASCULAR STRESS TEST  07/19/2006   EF 70%, NO EVIDENCE OF ISCHEMIA  . CHOLECYSTECTOMY  12/30/10  . COLONOSCOPY    . DILATION AND CURETTAGE OF UTERUS    . inguinal herniography    . NASAL SINUS SURGERY    . T & A    . TOOTH EXTRACTION    . TOTAL KNEE ARTHROPLASTY Left 09/11/2017   Procedure: LEFT TOTAL KNEE ARTHROPLASTY;  Surgeon: Gaynelle Arabian, MD;  Location: [facility];  Service: Orthopedics;  Laterality: Left;  with block  . US ECHOCARDIOGRAPHY  07/17/2006   EF 55-60%    There were no vitals filed for this visit.  Subjective Assessment - 03/26/18 1153    Subjective  They have found more cancer.  My  left knee is swelling more and I have more pain.     Pertinent History  Patient was diagnoesd on 03/08/18 with lfet grade 1-2 invasive ductal carcinoma breast cancer.It measures 1.5 cm and is located in the upper outer quadrant. It is ER/PR positive and HER2 negative with a Ki67 of < 1%. She also has a finding on her MRI of a 1.1 cm area on the right breast which will be biopsied. She had a left total knee replacement in 10/18 with Dr. Wynelle Link and continues to have pain in her left lateral knee. Her orthopedist reported it is likely due to her iliotibial band.    Patient Stated Goals  For breast cancer - reduce lymphedema risk and learn post op shoulder ROM HEP; for left knee - have less pain with standing and walking, sit to stand, and descending stairs.    Currently in Pain?  Yes    Pain Score  6     Pain Location  Knee    Pain Orientation  Left    Pain Descriptors / Indicators  Sharp    Pain Type  Chronic pain    Pain Onset  More than a month ago    Pain  Frequency  Constant    Aggravating Factors   standing, stairs, walking    Pain Relieving Factors  rest    Multiple Pain Sites  No         [facility] PT Assessment - 03/26/18 0001      Observation/Other Assessments-Edema    Edema  Circumferential      Circumferential Edema   Circumferential - Left   39 cm around joint                   [facility] Adult PT Treatment/Exercise - 03/26/18 0001      Modalities   Modalities  Iontophoresis      Iontophoresis   Type of Iontophoresis  Dexamethasone    Location  lateral left knee    Dose  1 ml, #2 [id number]      Manual Therapy   Manual Therapy  Soft tissue mobilization    Joint Mobilization  --    Soft tissue mobilization  left gastroc, soleus, iliotibial band, hamstring, quads             PT Education - 03/26/18 1208    Education provided  Yes    Education Details  Access Code: [email or web address]    Person(s) Educated  Patient    Methods  Explanation;Demonstration;Verbal  cues;Handout    Comprehension  Returned demonstration;Verbalized understanding          PT Long Term Goals - 03/21/18 1527      PT LONG TERM GOAL #1   Title  Patient will demonstrate she has returned ot baseline related to left shoulder ROM and function post operatively.    Time  8    Period  Weeks    Status  New      PT LONG TERM GOAL #2   Title  Decrease left knee pain to </= 4/10 with walking >/= 15 minutes.    Baseline  Pain 6/10 with walking.    Time  4    Period  Weeks      PT LONG TERM GOAL #3   Title  Decrease left knee pain to </= 5/10 with sit to stand.    Baseline  9/10    Time  4    Period  Weeks    Status  New      PT LONG TERM GOAL #4   Title  Decrease left knee pain to </= 4/10 with descending stairs.    Baseline  9/10    Time  4    Period  Weeks      Breast Clinic Goals - 03/21/18 1527      Patient will be able to verbalize understanding of pertinent lymphedema risk reduction practices relevant to her diagnosis specifically related to skin care.   Time  1    Period  Days    Status  Achieved      Patient will be able to return demonstrate and/or verbalize understanding of the post-op home exercise program related to regaining shoulder range of motion.   Time  1    Period  Days    Status  Achieved      Patient will be able to verbalize understanding of the importance of attending the postoperative After Breast Cancer Class for further lymphedema risk reduction education and therapeutic exercise.   Time  1    Period  Days    Status  Achieved           Plan - 03/26/18 1208  Clinical Impression Statement  Patient has pain with sit to stand and stairs.  Patient feel sher left knee gets swollen and is 39 cm circumference and we will monitor it.  Patient has learned new exercise for strength and balance.  Patient reports a nwe spot on her breast that is cancerous.  Patient has increased soft tissue mobility of the left quad and calf.  Patient is  able to extend her left hip better with hip extension in standing.  Patient will benefit from skilled therapy to address left knee pain so she will be able to participate in prehab program.      Rehab Potential  Excellent    Clinical Impairments Affecting Rehab Potential  None    PT Frequency  2x / week    PT Treatment/Interventions  ADLs/Self Care Home Management;Iontophoresis '4mg'$ /ml Dexamethasone;Moist Heat;Therapeutic exercise;Therapeutic activities;Patient/family education;Passive range of motion;Scar mobilization;Manual techniques;Dry needling    PT Next Visit Plan  Left knee manual techniques to reduce tightness and scar tissue; consider dry needling; ionto #2; updae goals    PT Home Exercise Plan  Access Code: [email or web address]    Consulted and Agree with Plan of Care  Patient       Patient will benefit from skilled therapeutic intervention in order to improve the following deficits and impairments:  Decreased knowledge of precautions, Impaired UE functional use, Decreased range of motion, Postural dysfunction, Decreased scar mobility, Decreased mobility, Pain, Increased fascial restricitons  Visit Diagnosis: Chronic pain of left knee  Stiffness of left knee, not elsewhere classified     Problem List Patient Active Problem List   Diagnosis Date Noted  . Family history of breast cancer   . Malignant neoplasm of upper-outer quadrant of left female breast ([place])   . Malignant neoplasm of upper-outer quadrant of left breast in female, estrogen receptor positive ([place]) 03/16/2018  . OA (osteoarthritis) of knee 09/11/2017  . Infrapatellar bursitis of right knee 04/29/2014  . Pneumonia, aspiration ([place]) 12/28/2012  . HYPOTHYROIDISM 11/15/2010  . HYPERLIPIDEMIA 11/15/2010  . DEPRESSION 11/15/2010  . GERD 11/15/2010  . CONSTIPATION 11/15/2010  . GALLSTONES 11/15/2010  . RENAL CYST 11/15/2010  . INSOMNIA UNSPECIFIED 11/15/2010  . COUGH 11/15/2010  . ABDOMINAL PAIN OTHER SPECIFIED SITE  11/15/2010  . TRANSAMINASES, SERUM, ELEVATED 11/15/2010    Earlie Counts, PT 03/26/18 12:40 PM   [facility] [address] [place], [place], [address] Phone: [phone]   Fax:  [phone]  Name: [name] MRN: [id number] Date of Birth: 03/10/1946

## 2018-03-26 NOTE — Therapy (Deleted)
[facility] [address], [place], [place], [address] Phone: [phone]   Fax:  [phone]  March 26, 2018   @[email or web address] @  Physical Therapy Discharge Summary  Patient: [name]  MRN: [id number]  Date of Birth: 11-20-1946   Diagnosis: Chronic pain of left knee  Stiffness of left knee, not elsewhere classified Referring Provider: Dr. Rolm Bookbinder   The above patient had been seen in Physical Therapy *** times of *** treatments scheduled with *** no shows and *** cancellations.  The treatment consisted of *** The patient is: {improved/worse/unchanged:3041574}  Subjective: ***  Discharge Findings: ***  Functional Status at Discharge: ***  {OYDXA:1287867}  Plan - 03/26/18 1208    Clinical Impression Statement  Patient has pain with sit to stand and stairs.  Patient feel sher left knee gets swollen and is 39 cm circumference and we will monitor it.  Patient has learned new exercise for strength and balance.  Patient reports a nwe spot on her breast that is cancerous.  Patient has increased soft tissue mobility of the left quad and calf.  Patient is able to extend her left hip better with hip extension in standing.  Patient will benefit from skilled therapy to address left knee pain so she will be able to participate in prehab program.      Rehab Potential  Excellent    Clinical Impairments Affecting Rehab Potential  None    PT Frequency  2x / week    PT Treatment/Interventions  ADLs/Self Care Home Management;Iontophoresis 4mg /ml Dexamethasone;Moist Heat;Therapeutic exercise;Therapeutic activities;Patient/family education;Passive range of motion;Scar mobilization;Manual techniques;Dry needling    PT Next Visit Plan  Left knee manual techniques to reduce tightness and scar tissue; consider dry needling; ionto #2; updae goals    PT Home Exercise Plan  Access Code: [email or web address]    Consulted and Agree with Plan  of Care  Patient       Sincerely,   GRAY,CHERYL, PT   CC @[email or web address] @  [facility] [address] [place], [place], [address] Phone: [phone]   Fax:  [phone]  Patient: [name]  MRN: [id number]  Date of Birth: Apr 21, 1946

## 2018-03-26 NOTE — Therapy (Deleted)
[facility] [address], [place], [place], [address] Phone: [phone]   Fax:  [phone]  Physical Therapy Treatment  Patient Details  Name: [name] MRN: [id number] Date of Birth: [birth year] Referring Provider: Dr. Rolm Bookbinder   Encounter Date: 03/26/2018    Past Medical History:  Diagnosis Date  . Complication of anesthesia    aspiration pna; following a colonoscopy   . Constipation   . Cough   . Depression   . Dysrhythmia    RBBB on 06-06-17 ekg   . Elevated liver function tests   . Esophageal stricture   . Family history of breast cancer   . Gallstones   . GERD (gastroesophageal reflux disease)   . Hiatal hernia   . Hyperlipemia   . Hypothyroidism   . Insomnia   . Malignant neoplasm of upper-outer quadrant of left female breast ([place])   . Nephrolithiasis   . Renal cyst     Past Surgical History:  Procedure Laterality Date  . CARDIOVASCULAR STRESS TEST  07/19/2006   EF 70%, NO EVIDENCE OF ISCHEMIA  . CHOLECYSTECTOMY  12/30/10  . COLONOSCOPY    . DILATION AND CURETTAGE OF UTERUS    . inguinal herniography    . NASAL SINUS SURGERY    . T & A    . TOOTH EXTRACTION    . TOTAL KNEE ARTHROPLASTY Left 09/11/2017   Procedure: LEFT TOTAL KNEE ARTHROPLASTY;  Surgeon: Gaynelle Arabian, MD;  Location: [facility];  Service: Orthopedics;  Laterality: Left;  with block  . US ECHOCARDIOGRAPHY  07/17/2006   EF 55-60%    There were no vitals filed for this visit.  Subjective Assessment - 03/26/18 1153    Subjective  They have found more cancer.  My left knee is swelling more and I have more pain.     Pertinent History  Patient was diagnoesd on 03/08/18 with lfet grade 1-2 invasive ductal carcinoma breast cancer.It measures 1.5 cm and is located in the upper outer quadrant. It is ER/PR positive and HER2 negative with a Ki67 of < 1%. She also has a finding on her MRI of a 1.1 cm area on the right breast which  will be biopsied. She had a left total knee replacement in 10/18 with Dr. Wynelle Link and continues to have pain in her left lateral knee. Her orthopedist reported it is likely due to her iliotibial band.    Patient Stated Goals  For breast cancer - reduce lymphedema risk and learn post op shoulder ROM HEP; for left knee - have less pain with standing and walking, sit to stand, and descending stairs.    Currently in Pain?  Yes    Pain Score  6     Pain Location  Knee    Pain Orientation  Left    Pain Descriptors / Indicators  Sharp    Pain Type  Chronic pain    Pain Onset  More than a month ago    Pain Frequency  Constant    Aggravating Factors   standing, stairs, walking    Pain Relieving Factors  rest    Multiple Pain Sites  No         [facility] PT Assessment - 03/26/18 0001      Observation/Other Assessments-Edema    Edema  Circumferential      Circumferential Edema   Circumferential - Left   39 cm around joint  [facility] Adult PT Treatment/Exercise - 03/26/18 0001      Modalities   Modalities  Iontophoresis      Iontophoresis   Type of Iontophoresis  Dexamethasone    Location  lateral left knee    Dose  1 ml, #2 [id number]      Manual Therapy   Manual Therapy  Soft tissue mobilization    Joint Mobilization  --    Soft tissue mobilization  left gastroc, soleus, iliotibial band, hamstring, quads             PT Education - 03/26/18 1208    Education provided  Yes    Education Details  Access Code: [email or web address]    Person(s) Educated  Patient    Methods  Explanation;Demonstration;Verbal cues;Handout    Comprehension  Returned demonstration;Verbalized understanding          PT Long Term Goals - 03/21/18 1527      PT LONG TERM GOAL #1   Title  Patient will demonstrate she has returned ot baseline related to left shoulder ROM and function post operatively.    Time  8    Period  Weeks    Status  New      PT LONG TERM GOAL #2   Title  Decrease  left knee pain to </= 4/10 with walking >/= 15 minutes.    Baseline  Pain 6/10 with walking.    Time  4    Period  Weeks      PT LONG TERM GOAL #3   Title  Decrease left knee pain to </= 5/10 with sit to stand.    Baseline  9/10    Time  4    Period  Weeks    Status  New      PT LONG TERM GOAL #4   Title  Decrease left knee pain to </= 4/10 with descending stairs.    Baseline  9/10    Time  4    Period  Weeks      Breast Clinic Goals - 03/21/18 1527      Patient will be able to verbalize understanding of pertinent lymphedema risk reduction practices relevant to her diagnosis specifically related to skin care.   Time  1    Period  Days    Status  Achieved      Patient will be able to return demonstrate and/or verbalize understanding of the post-op home exercise program related to regaining shoulder range of motion.   Time  1    Period  Days    Status  Achieved      Patient will be able to verbalize understanding of the importance of attending the postoperative After Breast Cancer Class for further lymphedema risk reduction education and therapeutic exercise.   Time  1    Period  Days    Status  Achieved           Plan - 03/26/18 1208    Clinical Impression Statement  Patient has pain with sit to stand and stairs.  Patient feel sher left knee gets swollen and is 39 cm circumference and we will monitor it.  Patient has learned new exercise for strength and balance.  Patient reports a nwe spot on her breast that is cancerous.  Patient has increased soft tissue mobility of the left quad and calf.  Patient is able to extend her left hip better with hip extension in standing.  Patient will benefit from skilled therapy to address left  knee pain so she will be able to participate in prehab program.      Rehab Potential  Excellent    Clinical Impairments Affecting Rehab Potential  None    PT Frequency  2x / week    PT Treatment/Interventions  ADLs/Self Care Home  Management;Iontophoresis '4mg'$ /ml Dexamethasone;Moist Heat;Therapeutic exercise;Therapeutic activities;Patient/family education;Passive range of motion;Scar mobilization;Manual techniques;Dry needling    PT Next Visit Plan  Left knee manual techniques to reduce tightness and scar tissue; consider dry needling; ionto #2; updae goals    PT Home Exercise Plan  Access Code: [email or web address]    Consulted and Agree with Plan of Care  Patient       Patient will benefit from skilled therapeutic intervention in order to improve the following deficits and impairments:  Decreased knowledge of precautions, Impaired UE functional use, Decreased range of motion, Postural dysfunction, Decreased scar mobility, Decreased mobility, Pain, Increased fascial restricitons  Visit Diagnosis: Chronic pain of left knee  Stiffness of left knee, not elsewhere classified     Problem List Patient Active Problem List   Diagnosis Date Noted  . Family history of breast cancer   . Malignant neoplasm of upper-outer quadrant of left female breast ([place])   . Malignant neoplasm of upper-outer quadrant of left breast in female, estrogen receptor positive ([place]) 03/16/2018  . OA (osteoarthritis) of knee 09/11/2017  . Infrapatellar bursitis of right knee 04/29/2014  . Pneumonia, aspiration ([place]) 12/28/2012  . HYPOTHYROIDISM 11/15/2010  . HYPERLIPIDEMIA 11/15/2010  . DEPRESSION 11/15/2010  . GERD 11/15/2010  . CONSTIPATION 11/15/2010  . GALLSTONES 11/15/2010  . RENAL CYST 11/15/2010  . INSOMNIA UNSPECIFIED 11/15/2010  . COUGH 11/15/2010  . ABDOMINAL PAIN OTHER SPECIFIED SITE 11/15/2010  . TRANSAMINASES, SERUM, ELEVATED 11/15/2010    Earlie Counts, PT 03/26/18 12:33 PM   [facility] [address] [place], [place], [address] Phone: [phone]   Fax:  [phone]  Name: GLENDA SPELMAN MRN: [id number] Date of Birth: 1946-04-04

## 2018-03-27 ENCOUNTER — Telehealth: Payer: Self-pay | Admitting: *Deleted

## 2018-03-27 ENCOUNTER — Encounter: Payer: Self-pay | Admitting: Physical Therapy

## 2018-03-27 ENCOUNTER — Other Ambulatory Visit: Payer: Self-pay | Admitting: Radiology

## 2018-03-27 DIAGNOSIS — C50912 Malignant neoplasm of unspecified site of left female breast: Secondary | ICD-10-CM

## 2018-03-27 DIAGNOSIS — C50412 Malignant neoplasm of upper-outer quadrant of left female breast: Secondary | ICD-10-CM | POA: Diagnosis not present

## 2018-03-27 DIAGNOSIS — R9389 Abnormal findings on diagnostic imaging of other specified body structures: Secondary | ICD-10-CM

## 2018-03-27 DIAGNOSIS — E038 Other specified hypothyroidism: Secondary | ICD-10-CM | POA: Diagnosis not present

## 2018-03-27 DIAGNOSIS — Z0000 Encounter for general adult medical examination without abnormal findings: Secondary | ICD-10-CM | POA: Diagnosis not present

## 2018-03-27 DIAGNOSIS — R42 Dizziness and giddiness: Secondary | ICD-10-CM | POA: Diagnosis not present

## 2018-03-27 DIAGNOSIS — K760 Fatty (change of) liver, not elsewhere classified: Secondary | ICD-10-CM | POA: Diagnosis not present

## 2018-03-27 DIAGNOSIS — M1990 Unspecified osteoarthritis, unspecified site: Secondary | ICD-10-CM | POA: Diagnosis not present

## 2018-03-27 DIAGNOSIS — Z6825 Body mass index (BMI) 25.0-25.9, adult: Secondary | ICD-10-CM | POA: Diagnosis not present

## 2018-03-27 DIAGNOSIS — E7849 Other hyperlipidemia: Secondary | ICD-10-CM | POA: Diagnosis not present

## 2018-03-27 DIAGNOSIS — Z1389 Encounter for screening for other disorder: Secondary | ICD-10-CM | POA: Diagnosis not present

## 2018-03-27 DIAGNOSIS — R945 Abnormal results of liver function studies: Secondary | ICD-10-CM | POA: Diagnosis not present

## 2018-03-27 DIAGNOSIS — N281 Cyst of kidney, acquired: Secondary | ICD-10-CM | POA: Diagnosis not present

## 2018-03-27 NOTE — Telephone Encounter (Signed)
  Oncology Nurse Navigator Documentation  Navigator Location: [facility] (03/27/18 1400)   )Navigator Encounter Type: Telephone;MDC Follow-up (03/27/18 1400) Telephone: Outgoing Call;Clinic/MDC Follow-up (03/27/18 1400)                                                  Time Spent with Patient: 15 (03/27/18 1400)

## 2018-03-29 ENCOUNTER — Ambulatory Visit: Payer: Medicare Other | Admitting: Physical Therapy

## 2018-03-29 ENCOUNTER — Encounter: Payer: Self-pay | Admitting: Physical Therapy

## 2018-03-29 DIAGNOSIS — M25562 Pain in left knee: Principal | ICD-10-CM

## 2018-03-29 DIAGNOSIS — M25662 Stiffness of left knee, not elsewhere classified: Secondary | ICD-10-CM

## 2018-03-29 DIAGNOSIS — G8929 Other chronic pain: Secondary | ICD-10-CM | POA: Diagnosis not present

## 2018-03-29 NOTE — Therapy (Signed)
[facility] [address], [place], [place], [address] Phone: [phone]   Fax:  [phone]  Physical Therapy Treatment  Patient Details  Name: [name] MRN: [id number] Date of Birth: 01-10-1946 Referring Provider: Dr. Rolm Bookbinder   Encounter Date: 03/29/2018  PT End of Session - 03/29/18 1500    Visit Number  4    Number of Visits  9    Date for PT Re-Evaluation  05/16/18    Authorization Type  medicare/tricare    PT Start Time  1400    PT Stop Time  1445    PT Time Calculation (min)  45 min    Activity Tolerance  Patient tolerated treatment well;No increased pain    Behavior During Therapy  WFL for tasks assessed/performed       Past Medical History:  Diagnosis Date  . Complication of anesthesia    aspiration pna; following a colonoscopy   . Constipation   . Cough   . Depression   . Dysrhythmia    RBBB on 06-06-17 ekg   . Elevated liver function tests   . Esophageal stricture   . Family history of breast cancer   . Gallstones   . GERD (gastroesophageal reflux disease)   . Hiatal hernia   . Hyperlipemia   . Hypothyroidism   . Insomnia   . Malignant neoplasm of upper-outer quadrant of left female breast ([place])   . Nephrolithiasis   . Renal cyst     Past Surgical History:  Procedure Laterality Date  . CARDIOVASCULAR STRESS TEST  07/19/2006   EF 70%, NO EVIDENCE OF ISCHEMIA  . CHOLECYSTECTOMY  12/30/10  . COLONOSCOPY    . DILATION AND CURETTAGE OF UTERUS    . inguinal herniography    . NASAL SINUS SURGERY    . T & A    . TOOTH EXTRACTION    . TOTAL KNEE ARTHROPLASTY Left 09/11/2017   Procedure: LEFT TOTAL KNEE ARTHROPLASTY;  Surgeon: Gaynelle Arabian, MD;  Location: [facility];  Service: Orthopedics;  Laterality: Left;  with block  . US ECHOCARDIOGRAPHY  07/17/2006   EF 55-60%    There were no vitals filed for this visit.  Subjective Assessment - 03/29/18 1403    Subjective  I used the  band to do exercises yesterday and set the pain off in left hip.      Patient is accompained by:  Family member husband    Pertinent History  Patient was diagnoesd on 03/08/18 with lfet grade 1-2 invasive ductal carcinoma breast cancer.It measures 1.5 cm and is located in the upper outer quadrant. It is ER/PR positive and HER2 negative with a Ki67 of < 1%. She also has a finding on her MRI of a 1.1 cm area on the right breast which will be biopsied. She had a left total knee replacement in 10/18 with Dr. Wynelle Link and continues to have pain in her left lateral knee. Her orthopedist reported it is likely due to her iliotibial band.    Patient Stated Goals  For breast cancer - reduce lymphedema risk and learn post op shoulder ROM HEP; for left knee - have less pain with standing and walking, sit to stand, and descending stairs.    Currently in Pain?  Yes    Pain Score  7     Pain Location  Knee    Pain Orientation  Left    Pain Descriptors / Indicators  Sharp    Pain  Type  Chronic pain    Pain Onset  More than a month ago    Pain Frequency  Constant    Aggravating Factors   standing, stairs, walking    Pain Relieving Factors  rest    Multiple Pain Sites  Yes    Pain Score  9    Pain Location  Hip    Pain Orientation  Left    Pain Type  Acute pain    Pain Onset  Yesterday    Pain Frequency  Constant    Aggravating Factors   walking    Pain Relieving Factors  being off left leg, prop up the left leg                       [facility] Adult PT Treatment/Exercise - 03/29/18 0001      Modalities   Modalities  Iontophoresis      Iontophoresis   Type of Iontophoresis  Dexamethasone    Location  lateral left knee 2nd on lateral left hip, #1, 1 ml, 6 hours,    Dose  1 ml, #3 [id number]      Manual Therapy   Manual Therapy  Soft tissue mobilization    Soft tissue mobilization  left quads, TFL, ITB, gluteals and hamstring       Trigger Point Dry Needling - 03/29/18 1456    Consent  Given?  Yes    Education Handout Provided  Yes    Muscles Treated Lower Body  Quadriceps;Tensor fascia lata;Gluteus maximus;Gluteus minimus    Gluteus Maximus Response  Twitch response elicited;Palpable increased muscle length    Gluteus Minimus Response  Twitch response elicited;Palpable increased muscle length    Tensor Fascia Lata Response  Twitch response elicited;Palpable increased muscle length    Quadriceps Response  Twitch response elicited;Palpable increased muscle length                PT Long Term Goals - 03/29/18 1706      PT LONG TERM GOAL #2   Title  Decrease left knee pain to </= 4/10 with walking >/= 15 minutes.    Baseline  Pain 6/10 with walking.    Time  4    Period  Weeks    Status  On-going      PT LONG TERM GOAL #3   Title  Decrease left knee pain to </= 5/10 with sit to stand.    Baseline  9/10    Time  4    Period  Weeks    Status  On-going      PT LONG TERM GOAL #4   Title  Decrease left knee pain to </= 4/10 with descending stairs.    Baseline  9/10    Time  4    Period  Weeks    Status  On-going      Breast Clinic Goals - 03/21/18 1527      Patient will be able to verbalize understanding of pertinent lymphedema risk reduction practices relevant to her diagnosis specifically related to skin care.   Time  1    Period  Days    Status  Achieved      Patient will be able to return demonstrate and/or verbalize understanding of the post-op home exercise program related to regaining shoulder range of motion.   Time  1    Period  Days    Status  Achieved      Patient will be able to  verbalize understanding of the importance of attending the postoperative After Breast Cancer Class for further lymphedema risk reduction education and therapeutic exercise.   Time  1    Period  Days    Status  Achieved           Plan - 03/29/18 1500    Clinical Impression Statement  Patient had a flare-up from doing the hip exercises with the green band  so we did not do exercises today.  Patient had trigger points in the left quadriceps, itiotibial band, hamstring, gluteals and TFL.  Patient pain decreased to 2/10 after manual work and able to walk upright with placing more pressure in the left leg.  Patient will benefit from skilled PT to perform soft tissue work and dryneedling to reduce muscle pain and guide patient with her exercise to prevent flare-ups.     Rehab Potential  Excellent    Clinical Impairments Affecting Rehab Potential  None    PT Frequency  2x / week    PT Duration  4 weeks    PT Treatment/Interventions  ADLs/Self Care Home Management;Iontophoresis '4mg'$ /ml Dexamethasone;Moist Heat;Therapeutic exercise;Therapeutic activities;Patient/family education;Passive range of motion;Scar mobilization;Manual techniques;Dry needling    PT Next Visit Plan  soft tissue work to left quads, ITB, hamstring and gluteal, nustep, knee ROM exercise; hip strength without band; see if dry needling has helped     Consulted and Agree with Plan of Care  Patient    Family Member Consulted  Husband       Patient will benefit from skilled therapeutic intervention in order to improve the following deficits and impairments:  Decreased knowledge of precautions, Impaired UE functional use, Decreased range of motion, Postural dysfunction, Decreased scar mobility, Decreased mobility, Pain, Increased fascial restricitons  Visit Diagnosis: Chronic pain of left knee  Stiffness of left knee, not elsewhere classified     Problem List Patient Active Problem List   Diagnosis Date Noted  . Family history of breast cancer   . Malignant neoplasm of upper-outer quadrant of left female breast ([place])   . Malignant neoplasm of upper-outer quadrant of left breast in female, estrogen receptor positive ([place]) 03/16/2018  . OA (osteoarthritis) of knee 09/11/2017  . Infrapatellar bursitis of right knee 04/29/2014  . Pneumonia, aspiration ([place]) 12/28/2012  . HYPOTHYROIDISM  11/15/2010  . HYPERLIPIDEMIA 11/15/2010  . DEPRESSION 11/15/2010  . GERD 11/15/2010  . CONSTIPATION 11/15/2010  . GALLSTONES 11/15/2010  . RENAL CYST 11/15/2010  . INSOMNIA UNSPECIFIED 11/15/2010  . COUGH 11/15/2010  . ABDOMINAL PAIN OTHER SPECIFIED SITE 11/15/2010  . TRANSAMINASES, SERUM, ELEVATED 11/15/2010    Earlie Counts, PT 03/29/18 5:07 PM   [facility] [address], [place], [place], [address] Phone: [phone]   Fax:  [phone]  Name: [name] MRN: [id number] Date of Birth: 1946-02-27

## 2018-03-30 ENCOUNTER — Ambulatory Visit: Payer: Self-pay | Admitting: Genetic Counselor

## 2018-03-30 NOTE — Progress Notes (Signed)
[facility]            Result Disclosure        Patient Name: [name] Patient DOB: 11-20-1946 Patient Age: 72 y.o. Encounter Date: 03/30/2018  Referring Provider: Lurline Del, MD  Reason for Call: Discuss results of genetic testing- 1st of 2 results   This is a brief note to document preliminary genetic test results.  Ms. Goers was called today to discuss the first of her genetic test results. Please see the Genetics note from her visit on 03/22/2018. Due to time contraints and needing actionable results for medical management, [facility]'s STAT Breast panel was ordered first.  Preliminary Test Results: Genetic testing involved analysis of 9 genes: ATM, BRCA1, BRCA2, CDH1, CHEK2, PALB2, PTEN, STK11 and TP53 genes. Testing was normal and did not reveal a mutation in these genes. Testing is in process for the remaining genes on the Multi-Cancer panel.  Once results are obtained, Ms. Abbasi will be called again. She does not need to wait to proceed with treatment planning.   Steele Berg, MS, [place] Certified Genetic Counselor phone: [phone]

## 2018-04-02 ENCOUNTER — Other Ambulatory Visit: Payer: Self-pay | Admitting: Radiology

## 2018-04-02 ENCOUNTER — Encounter: Payer: Self-pay | Admitting: Genetic Counselor

## 2018-04-02 ENCOUNTER — Ambulatory Visit
Admission: RE | Admit: 2018-04-02 | Discharge: 2018-04-02 | Disposition: A | Discharge status: Home / Self Care | Payer: Medicare Other | Source: Ambulatory Visit | Attending: Radiology | Admitting: Radiology

## 2018-04-02 ENCOUNTER — Ambulatory Visit: Payer: Self-pay | Admitting: Genetic Counselor

## 2018-04-02 DIAGNOSIS — Z1379 Encounter for other screening for genetic and chromosomal anomalies: Secondary | ICD-10-CM

## 2018-04-02 DIAGNOSIS — N6011 Diffuse cystic mastopathy of right breast: Secondary | ICD-10-CM | POA: Diagnosis not present

## 2018-04-02 DIAGNOSIS — Z853 Personal history of malignant neoplasm of breast: Secondary | ICD-10-CM

## 2018-04-02 DIAGNOSIS — R9389 Abnormal findings on diagnostic imaging of other specified body structures: Secondary | ICD-10-CM

## 2018-04-02 DIAGNOSIS — C50912 Malignant neoplasm of unspecified site of left female breast: Secondary | ICD-10-CM

## 2018-04-02 DIAGNOSIS — N6312 Unspecified lump in the right breast, upper inner quadrant: Secondary | ICD-10-CM | POA: Diagnosis not present

## 2018-04-02 HISTORY — DX: Encounter for other screening for genetic and chromosomal anomalies: Z13.79

## 2018-04-02 MED ORDER — GADOBENATE DIMEGLUMINE 529 MG/ML IV SOLN
16.0000 mL | Freq: Once | INTRAVENOUS | Status: AC | PRN
  Administered 2018-04-02: 16 mL via INTRAVENOUS

## 2018-04-02 NOTE — Progress Notes (Signed)
[facility]       Genetic Test Results    Patient Name: [name] Patient DOB: Apr 26, 1946 Patient Age: 72 y.o. Encounter Date: 04/02/2018  Referring Provider: Lurline Del, MD  Primary Care Provider: Shon Baton, MD   [name] was called today to discuss genetic test results. Please see the Genetics note from her visit on 03/22/2018 for a detailed discussion of her personal and family history.  Genetic Testing: At the time of [name]'s visit, she decided to pursue genetic testing of 9 genes that may be used to help guide treatment decisions. Once that test was completed, additional genes on a larger panel were analyzed. Testing which included sequencing and deletion/duplication analysis. Testing did not reveal a pathogenic mutation in any of the genes analyzed. A copy of the genetic test report will be scanned into Epic under the Media tab.  The genes analyzed were the 83 genes on [facility]'s Multi-Cancer panel (ALK, APC, ATM, AXIN2, BAP1, BARD1, BLM, BMPR1A, BRCA1, BRCA2, BRIP1, CASR, CDC73, CDH1, CDK4, CDKN1B, CDKN1C, CDKN2A, CEBPA, CHEK2, CTNNA1, DICER1, DIS3L2, EGFR, EPCAM, FH, FLCN, GATA2, GPC3, GREM1, HOXB13, HRAS, KIT, MAX, MEN1, MET, MITF, MLH1, MSH2, MSH3, MSH6, MUTYH, NBN, NF1, NF2, NTHL1, PALB2, PDGFRA, PHOX2B, PMS2, POLD1, POLE, POT1, PRKAR1A, PTCH1, PTEN, RAD50, RAD51C, RAD51D, RB1, RECQL4, RET, RUNX1, SDHA, SDHAF2, SDHB, SDHC, SDHD, SMAD4, SMARCA4, SMARCB1, SMARCE1, STK11, SUFU, TERC, TERT, TMEM127, TP53, TSC1, TSC2, VHL, WRN, WT1).  Since the current test is not perfect, it is possible that there may be a gene mutation that current testing cannot detect, but that chance is small. It is possible that a different genetic factor, which has not yet been discovered or is not on this panel, is responsible for the cancer diagnoses in the family. Again, the likelihood of this is low. No additional testing is recommended at this time for Ms.  Drummonds.  Two Variants of Uncertain Significance were detected: PDGFRA c.133T>C (p.Phe45Leu) and RECQL4 c.59G>C (p.Arg20Pro). This is still considered a normal result. While at this time, it is unknown if this finding is associated with increased cancer risk, the majority of these variants get reclassified to be inconsequential. Medical management should not be based on this finding. With time, we suspect the lab will determine the significance, if any. If we do learn more about it, we will try to contact [name] to discuss it further. It is important to stay in touch with [place] periodically and keep the address and phone number up to date.  Cancer Screening: These results suggest that [name] cancer was most likely not due to an inherited predisposition. Most cancers happen by chance and this test, along with details of her family history, suggests that her cancer falls into this category. She is recommended to follow the cancer screening guidelines provided by her physician.   Family Members: Family members are at some increased risk of developing cancer, over the general population risk, simply due to the family history. Women are recommended to have a yearly mammogram beginning at age 28, a yearly clinical breast exam, a yearly gynecologic exam and perform monthly breast self-exams. Colon cancer screening is recommended to begin by age 80 in both men and women, unless there is a family history of colon cancer or colon polyps or an individual has a personal history to warrant initiating screening at a younger age.  Any relative who had cancer at  a young age or had a particularly rare cancer may also wish to pursue genetic testing. Genetic counselors can be located in other cities, by visiting the website of the [employer] (Artist[employer].se) and Field seismologist for a Dietitian by zip code.   Family members are not recommended to get tested for the above VUSs outside of a research  protocol as this finding has no implications for their medical management.    Lastly, cancer genetics is a rapidly advancing field and it is possible that new genetic tests will be appropriate for [name] in the future. We encourage her to remain in contact with [place] on an annual basis so we can update her personal and family histories, and let her know of advances in cancer genetics that may benefit the family. Our contact number was provided. Ms. Short is welcome to call anytime with additional questions.     Steele Berg, MS, [place] Certified Genetic Counselor phone: [phone]

## 2018-04-03 DIAGNOSIS — Z1212 Encounter for screening for malignant neoplasm of rectum: Secondary | ICD-10-CM | POA: Diagnosis not present

## 2018-04-09 ENCOUNTER — Other Ambulatory Visit: Payer: Self-pay | Admitting: General Surgery

## 2018-04-09 ENCOUNTER — Ambulatory Visit: Payer: Medicare Other | Admitting: Physical Therapy

## 2018-04-09 ENCOUNTER — Encounter: Payer: Self-pay | Admitting: Physical Therapy

## 2018-04-09 DIAGNOSIS — M25562 Pain in left knee: Principal | ICD-10-CM

## 2018-04-09 DIAGNOSIS — M25662 Stiffness of left knee, not elsewhere classified: Secondary | ICD-10-CM | POA: Diagnosis not present

## 2018-04-09 DIAGNOSIS — G8929 Other chronic pain: Secondary | ICD-10-CM

## 2018-04-09 DIAGNOSIS — R102 Pelvic and perineal pain: Secondary | ICD-10-CM | POA: Diagnosis not present

## 2018-04-09 DIAGNOSIS — D493 Neoplasm of unspecified behavior of breast: Secondary | ICD-10-CM | POA: Diagnosis not present

## 2018-04-09 DIAGNOSIS — C50412 Malignant neoplasm of upper-outer quadrant of left female breast: Secondary | ICD-10-CM | POA: Diagnosis not present

## 2018-04-09 NOTE — Therapy (Signed)
[facility] [address] [place], [place], [address] Phone: [phone]   Fax:  [phone]  Physical Therapy Treatment  Patient Details  Name: [name] MRN: [id number] Date of Birth: 11-20-1946 Referring Provider: Dr. Rolm Bookbinder   Encounter Date: 04/09/2018  PT End of Session - 04/09/18 1409    Visit Number  5    Number of Visits  9    Date for PT Re-Evaluation  05/16/18    Authorization Type  medicare/tricare    PT Start Time  1400    PT Stop Time  1440    PT Time Calculation (min)  40 min    Activity Tolerance  Patient tolerated treatment well;No increased pain    Behavior During Therapy  WFL for tasks assessed/performed       Past Medical History:  Diagnosis Date  . Complication of anesthesia    aspiration pna; following a colonoscopy   . Constipation   . Cough   . Depression   . Dysrhythmia    RBBB on 06-06-17 ekg   . Elevated liver function tests   . Esophageal stricture   . Family history of breast cancer   . Gallstones   . Genetic testing 04/02/2018   STAT Breast panel with reflex to Multi-Cancer panel (83 genes) @ [facility] - No pathogenic mutations detected  . GERD (gastroesophageal reflux disease)   . Hiatal hernia   . Hyperlipemia   . Hypothyroidism   . Insomnia   . Malignant neoplasm of upper-outer quadrant of left female breast ([place])   . Nephrolithiasis   . Renal cyst     Past Surgical History:  Procedure Laterality Date  . CARDIOVASCULAR STRESS TEST  07/19/2006   EF 70%, NO EVIDENCE OF ISCHEMIA  . CHOLECYSTECTOMY  12/30/10  . COLONOSCOPY    . DILATION AND CURETTAGE OF UTERUS    . inguinal herniography    . NASAL SINUS SURGERY    . T & A    . TOOTH EXTRACTION    . TOTAL KNEE ARTHROPLASTY Left 09/11/2017   Procedure: LEFT TOTAL KNEE ARTHROPLASTY;  Surgeon: Gaynelle Arabian, MD;  Location: [facility];  Service: Orthopedics;  Laterality: Left;  with block  . US ECHOCARDIOGRAPHY   07/17/2006   EF 55-60%    There were no vitals filed for this visit.  Subjective Assessment - 04/09/18 1402    Subjective  I have one more MRI biopsy. After I take off the ionto I feel better.     Pertinent History  Patient was diagnoesd on 03/08/18 with lfet grade 1-2 invasive ductal carcinoma breast cancer.It measures 1.5 cm and is located in the upper outer quadrant. It is ER/PR positive and HER2 negative with a Ki67 of < 1%. She also has a finding on her MRI of a 1.1 cm area on the right breast which will be biopsied. She had a left total knee replacement in 10/18 with Dr. Wynelle Link and continues to have pain in her left lateral knee. Her orthopedist reported it is likely due to her iliotibial band.    Patient Stated Goals  For breast cancer - reduce lymphedema risk and learn post op shoulder ROM HEP; for left knee - have less pain with standing and walking, sit to stand, and descending stairs.    Currently in Pain?  Yes    Pain Score  8     Pain Location  Knee    Pain Orientation  Left  Pain Descriptors / Indicators  Sharp    Pain Type  Chronic pain    Pain Onset  More than a month ago    Pain Frequency  Constant    Aggravating Factors   standing, stairs, walking    Pain Relieving Factors  rest    Multiple Pain Sites  Yes    Pain Score  5    Pain Location  Groin    Pain Orientation  Left    Pain Descriptors / Indicators  -- pulling    Pain Type  Acute pain    Pain Onset  In the past 7 days    Pain Frequency  Intermittent    Aggravating Factors   walking    Pain Relieving Factors  being off left leg, prop up the left leg         [facility] PT Assessment - 04/09/18 0001      AROM   Right Knee Flexion  130 PROM 135                   [facility] Adult PT Treatment/Exercise - 04/09/18 0001      Knee/Hip Exercises: Aerobic   Nustep  level 1 x 7 min seat #11, arms #10      Modalities   Modalities  Iontophoresis      Iontophoresis   Type of Iontophoresis  Dexamethasone     Location  lateral left knee    Dose  1 ml, #4 [id number]    Time  6 hour patch      Manual Therapy   Manual Therapy  Soft tissue mobilization    Manual therapy comments  soft tissue work with assistive device to work around the lateral patella, lateral left knee, lower quads, left patella tendon    Joint Mobilization  left patella medial glide    Soft tissue mobilization  left quads, left patella, left ITB band left patella tendon             PT Education - 04/09/18 1446    Education provided  Yes    Education Details  Access Code: [email or web address]     Person(s) Educated  Patient    Methods  Explanation;Demonstration;Verbal cues;Handout    Comprehension  Returned demonstration;Verbalized understanding          PT Long Term Goals - 03/29/18 1706      PT LONG TERM GOAL #2   Title  Decrease left knee pain to </= 4/10 with walking >/= 15 minutes.    Baseline  Pain 6/10 with walking.    Time  4    Period  Weeks    Status  On-going      PT LONG TERM GOAL #3   Title  Decrease left knee pain to </= 5/10 with sit to stand.    Baseline  9/10    Time  4    Period  Weeks    Status  On-going      PT LONG TERM GOAL #4   Title  Decrease left knee pain to </= 4/10 with descending stairs.    Baseline  9/10    Time  4    Period  Weeks    Status  On-going      Breast Clinic Goals - 03/21/18 1527      Patient will be able to verbalize understanding of pertinent lymphedema risk reduction practices relevant to her diagnosis specifically related to skin care.   Time  1  Period  Days    Status  Achieved      Patient will be able to return demonstrate and/or verbalize understanding of the post-op home exercise program related to regaining shoulder range of motion.   Time  1    Period  Days    Status  Achieved      Patient will be able to verbalize understanding of the importance of attending the postoperative After Breast Cancer Class for further lymphedema risk reduction education  and therapeutic exercise.   Time  1    Period  Days    Status  Achieved           Plan - 04/09/18 1447    Clinical Impression Statement  Patient lateral left hip pain is 0/10 and medial left groin is 5/10.  Patient has full left knee ROM.  Patient is able to walk without a limp.  Patient has difficulty with extending left hip extension.  Patient has decreased mobility under left lateral patella.  Patient will benefit from skilled PT to perform soft tissue work and dry needling to reduce muscle pain and guid patient with her exercise to prevent flare-ups.     Rehab Potential  Excellent    Clinical Impairments Affecting Rehab Potential  None    PT Frequency  2x / week    PT Duration  4 weeks    PT Treatment/Interventions  ADLs/Self Care Home Management;Iontophoresis '4mg'$ /ml Dexamethasone;Moist Heat;Therapeutic exercise;Therapeutic activities;Patient/family education;Passive range of motion;Scar mobilization;Manual techniques;Dry needling    PT Next Visit Plan  soft tissue work to left quads, ITB, hamstring and gluteal, nustep, knee ROM exercise; hip strength without band; see if dry needling has helped     PT Home Exercise Plan  Access Code: [email or web address]     Consulted and Agree with Plan of Care  Patient       Patient will benefit from skilled therapeutic intervention in order to improve the following deficits and impairments:  Decreased knowledge of precautions, Impaired UE functional use, Decreased range of motion, Postural dysfunction, Decreased scar mobility, Decreased mobility, Pain, Increased fascial restricitons  Visit Diagnosis: Chronic pain of left knee  Stiffness of left knee, not elsewhere classified     Problem List Patient Active Problem List   Diagnosis Date Noted  . Genetic testing 04/02/2018  . Family history of breast cancer   . Malignant neoplasm of upper-outer quadrant of left female breast ([place])   . Malignant neoplasm of upper-outer quadrant of left breast in  female, estrogen receptor positive ([place]) 03/16/2018  . OA (osteoarthritis) of knee 09/11/2017  . Infrapatellar bursitis of right knee 04/29/2014  . Pneumonia, aspiration ([place]) 12/28/2012  . HYPOTHYROIDISM 11/15/2010  . HYPERLIPIDEMIA 11/15/2010  . DEPRESSION 11/15/2010  . GERD 11/15/2010  . CONSTIPATION 11/15/2010  . GALLSTONES 11/15/2010  . RENAL CYST 11/15/2010  . INSOMNIA UNSPECIFIED 11/15/2010  . COUGH 11/15/2010  . ABDOMINAL PAIN OTHER SPECIFIED SITE 11/15/2010  . TRANSAMINASES, SERUM, ELEVATED 11/15/2010    Earlie Counts, PT 04/09/18 2:56 PM   [facility] [address] [place], [place], [address] Phone: [phone]   Fax:  [phone]  Name: [name] MRN: [id number] Date of Birth: [birth year]

## 2018-04-09 NOTE — Patient Instructions (Signed)
Access Code: [email or web address]  URL: [email or web address]  Date: 04/09/2018  Prepared by: Victoria Vera - 10 reps - 2 sets - 1x daily - 7x weekly  Beginner Clam - 10 reps - 2 sets - 1x daily - 7x weekly  Dead Bug - 10 reps - 1 sets - 1x daily - 7x weekly  Supine Quadricep Sets - 10 reps - 1 sets - 5 hold - 2x daily - 7x weekly  Supine Heel Slide - 10 reps - 1 sets - 2 hold - 1x daily - 7x weekly  Seated Bicep Curls Supinated with Dumbbells - 10 reps - 3 sets - 1x daily - 7x weekly  Supine Alternating Shoulder Flexion - 10 reps - 3 sets - 1x daily - 7x weekly  Seated Wrist Flexion with Dumbbell - 10 reps - 3 sets - 1x daily - 7x weekly  Supine Shoulder Horizontal Abduction with Dumbbells - 10 reps - 1 sets - 1x daily - 7x weekly  [facility] [address], [place], [place] [address] Phone # [phone] Fax [phone]

## 2018-04-10 ENCOUNTER — Other Ambulatory Visit: Payer: Self-pay | Admitting: General Surgery

## 2018-04-10 DIAGNOSIS — N6310 Unspecified lump in the right breast, unspecified quadrant: Secondary | ICD-10-CM

## 2018-04-10 DIAGNOSIS — Z170 Estrogen receptor positive status [ER+]: Secondary | ICD-10-CM

## 2018-04-10 DIAGNOSIS — C50412 Malignant neoplasm of upper-outer quadrant of left female breast: Secondary | ICD-10-CM

## 2018-04-11 ENCOUNTER — Inpatient Hospital Stay: Admission: RE | Admit: 2018-04-11 | Payer: Medicare Other | Source: Ambulatory Visit

## 2018-04-11 ENCOUNTER — Encounter: Payer: Medicare Other | Admitting: Physical Therapy

## 2018-04-11 ENCOUNTER — Ambulatory Visit
Admission: RE | Admit: 2018-04-11 | Discharge: 2018-04-11 | Disposition: A | Discharge status: Home / Self Care | Payer: Medicare Other | Source: Ambulatory Visit | Attending: General Surgery | Admitting: General Surgery

## 2018-04-11 DIAGNOSIS — R102 Pelvic and perineal pain: Secondary | ICD-10-CM

## 2018-04-12 ENCOUNTER — Encounter (HOSPITAL_BASED_OUTPATIENT_CLINIC_OR_DEPARTMENT_OTHER): Payer: Self-pay | Admitting: *Deleted

## 2018-04-13 ENCOUNTER — Encounter: Payer: Self-pay | Admitting: Physical Therapy

## 2018-04-13 ENCOUNTER — Ambulatory Visit: Payer: Medicare Other | Attending: General Surgery | Admitting: Physical Therapy

## 2018-04-13 DIAGNOSIS — R293 Abnormal posture: Secondary | ICD-10-CM | POA: Diagnosis not present

## 2018-04-13 DIAGNOSIS — R2689 Other abnormalities of gait and mobility: Secondary | ICD-10-CM | POA: Diagnosis not present

## 2018-04-13 DIAGNOSIS — Z170 Estrogen receptor positive status [ER+]: Secondary | ICD-10-CM | POA: Insufficient documentation

## 2018-04-13 DIAGNOSIS — M25662 Stiffness of left knee, not elsewhere classified: Secondary | ICD-10-CM | POA: Insufficient documentation

## 2018-04-13 DIAGNOSIS — G8929 Other chronic pain: Secondary | ICD-10-CM

## 2018-04-13 DIAGNOSIS — M25562 Pain in left knee: Secondary | ICD-10-CM | POA: Diagnosis not present

## 2018-04-13 DIAGNOSIS — C50412 Malignant neoplasm of upper-outer quadrant of left female breast: Secondary | ICD-10-CM | POA: Insufficient documentation

## 2018-04-13 NOTE — Patient Instructions (Signed)
Book and Roll can be found on [place]: The Melt Method by Normand Sloop. The soft foam roll is by [employer]: Pro Roller Soft.  [employer] [address] also carries the roll. See which place you get a better deal.    1: Lay comfortably, roll to calf muscles; work three sections: top, middle, lower calf. Glide these areas with a slow, heaviness, working in small areas. Don't abandon the breathe. Do 1 min each section.   2. Second part is the roll side to side: Do the same three sections you glided above.    Hamstrings:   1. Put roll behind the thigh (hamstring) and perform the side to side rolls 1 min. You might get only 2 sections versus the 3 on the hamstrings.   2. Tennis ball trick: Seated position: tennis ball, small dog toy ( ball ) that looks like a tennis ball. Start with the ball high on the hamstring, sit tall, then either relax for 30- sec to a min or begin kicking out your knee straight 10x, then move ball down and repeat. Work the three sections like on the calves.

## 2018-04-13 NOTE — Progress Notes (Signed)
Boost drink given to patient's husband and instructions given on when to complete drinking by on day of surgery.

## 2018-04-13 NOTE — Therapy (Signed)
[facility] [address], [place], [place], [address] Phone: [phone]   Fax:  [phone]  Physical Therapy Treatment  Patient Details  Name: [name] MRN: [id number] Date of Birth: 12/14/1945 Referring Provider: Dr. Rolm Bookbinder   Encounter Date: 04/13/2018  PT End of Session - 04/13/18 1103    Visit Number  6    Number of Visits  9    Date for PT Re-Evaluation  05/16/18    Authorization Type  medicare/tricare    PT Start Time  1103    PT Stop Time  1150    PT Time Calculation (min)  47 min    Activity Tolerance  Patient tolerated treatment well;No increased pain    Behavior During Therapy  WFL for tasks assessed/performed       Past Medical History:  Diagnosis Date  . Complication of anesthesia    aspiration pna; following a colonoscopy, pt requests head of bed elevated if possible.  . Constipation   . Cough   . Depression   . Dysrhythmia    RBBB on 06-06-17 ekg   . Elevated liver function tests   . Esophageal stricture   . Family history of breast cancer   . Gallstones   . Genetic testing 04/02/2018   STAT Breast panel with reflex to Multi-Cancer panel (83 genes) @ [facility] - No pathogenic mutations detected  . GERD (gastroesophageal reflux disease)   . Hiatal hernia   . Hyperlipemia   . Hypothyroidism   . Insomnia   . Malignant neoplasm of upper-outer quadrant of left female breast ([place])   . Nephrolithiasis   . Renal cyst     Past Surgical History:  Procedure Laterality Date  . CARDIOVASCULAR STRESS TEST  07/19/2006   EF 70%, NO EVIDENCE OF ISCHEMIA  . CHOLECYSTECTOMY  12/30/10  . COLONOSCOPY    . DILATION AND CURETTAGE OF UTERUS    . inguinal herniography    . NASAL SINUS SURGERY    . T & A    . TOOTH EXTRACTION    . TOTAL KNEE ARTHROPLASTY Left 09/11/2017   Procedure: LEFT TOTAL KNEE ARTHROPLASTY;  Surgeon: Gaynelle Arabian, MD;  Location: [facility];  Service: Orthopedics;   Laterality: Left;  with block  . US ECHOCARDIOGRAPHY  07/17/2006   EF 55-60%    There were no vitals filed for this visit.  Subjective Assessment - 04/13/18 1105    Subjective  I think the tool used last time made my knee/leg sore.     Pertinent History  Patient was diagnoesd on 03/08/18 with lfet grade 1-2 invasive ductal carcinoma breast cancer.It measures 1.5 cm and is located in the upper outer quadrant. It is ER/PR positive and HER2 negative with a Ki67 of < 1%. She also has a finding on her MRI of a 1.1 cm area on the right breast which will be biopsied. She had a left total knee replacement in 10/18 with Dr. Wynelle Link and continues to have pain in her left lateral knee. Her orthopedist reported it is likely due to her iliotibial band.    Currently in Pain?  Yes    Pain Score  5     Pain Location  Knee    Pain Orientation  Left    Pain Descriptors / Indicators  Dull;Aching    Multiple Pain Sites  No  [place] Adult PT Treatment/Exercise - 04/13/18 0001      Knee/Hip Exercises: Aerobic   Nustep  L2 x 8 min PTA present for status      Modalities   Modalities  Iontophoresis      Iontophoresis   Type of Iontophoresis  Dexamethasone #5 skin intact    Location  lateral left knee    Dose  1 ml    Time  6 hour patch      Manual Therapy   Manual Therapy  Soft tissue mobilization    Manual therapy comments  soft tissue work with assistive device to work around the lateral patella, lateral left knee, lower quads, left patella tendon    Soft tissue mobilization  left quads, left patella, left ITB band left patella tendon             PT Education - 04/13/18 1124    Education provided  Yes    Education Details  Melt Method process for knee pain,     Person(s) Educated  Patient    Methods  Explanation;Demonstration;Handout;Verbal cues;Tactile cues    Comprehension  Verbalized understanding;Returned demonstration          PT Long Term Goals  - 03/29/18 1706      PT LONG TERM GOAL #2   Title  Decrease left knee pain to </= 4/10 with walking >/= 15 minutes.    Baseline  Pain 6/10 with walking.    Time  4    Period  Weeks    Status  On-going      PT LONG TERM GOAL #3   Title  Decrease left knee pain to </= 5/10 with sit to stand.    Baseline  9/10    Time  4    Period  Weeks    Status  On-going      PT LONG TERM GOAL #4   Title  Decrease left knee pain to </= 4/10 with descending stairs.    Baseline  9/10    Time  4    Period  Weeks    Status  On-going      Breast Clinic Goals - 03/21/18 1527      Patient will be able to verbalize understanding of pertinent lymphedema risk reduction practices relevant to her diagnosis specifically related to skin care.   Time  1    Period  Days    Status  Achieved      Patient will be able to return demonstrate and/or verbalize understanding of the post-op home exercise program related to regaining shoulder range of motion.   Time  1    Period  Days    Status  Achieved      Patient will be able to verbalize understanding of the importance of attending the postoperative After Breast Cancer Class for further lymphedema risk reduction education and therapeutic exercise.   Time  1    Period  Days    Status  Achieved           Plan - 04/13/18 1103    Clinical Impression Statement  Pt presents today with similar knee pain. She was sore after her last session but we discussed that might have been scar tissue breaking up. She was open to instrument assisted manual work today. Pt is getting prepared to take what she hopes is a short break from PT  so she was educated in home tools that she could use to help with her myofascial gliding  and sepage/hydration to her knee and adjacent tissues. Pain was slightly improved post session. Pt continues to do well with ionto patch and has one final patch.      Rehab Potential  Excellent    Clinical Impairments Affecting Rehab Potential  None     PT Frequency  2x / week    PT Duration  4 weeks    PT Treatment/Interventions  ADLs/Self Care Home Management;Iontophoresis 67m/ml Dexamethasone;Moist Heat;Therapeutic exercise;Therapeutic activities;Patient/family education;Passive range of motion;Scar mobilization;Manual techniques;Dry needling    PT Next Visit Plan  Pt would like to come 1x next week before her surgery to  have her last ionto patch and work on her leg more.    PT Home Exercise Plan  Access Code: [email or web address]    Consulted and Agree with Plan of Care  Patient       Patient will benefit from skilled therapeutic intervention in order to improve the following deficits and impairments:  Decreased knowledge of precautions, Impaired UE functional use, Decreased range of motion, Postural dysfunction, Decreased scar mobility, Decreased mobility, Pain, Increased fascial restricitons  Visit Diagnosis: Chronic pain of left knee  Stiffness of left knee, not elsewhere classified  Malignant neoplasm of upper-outer quadrant of left breast in female, estrogen receptor positive ([place]  Abnormal posture     Problem List Patient Active Problem List   Diagnosis Date Noted  . Genetic testing 04/02/2018  . Family history of breast cancer   . Malignant neoplasm of upper-outer quadrant of left female breast ([place]   . Malignant neoplasm of upper-outer quadrant of left breast in female, estrogen receptor positive ([place] 03/16/2018  . OA (osteoarthritis) of knee 09/11/2017  . Infrapatellar bursitis of right knee 04/29/2014  . Pneumonia, aspiration ([place] 12/28/2012  . HYPOTHYROIDISM 11/15/2010  . HYPERLIPIDEMIA 11/15/2010  . DEPRESSION 11/15/2010  . GERD 11/15/2010  . CONSTIPATION 11/15/2010  . GALLSTONES 11/15/2010  . RENAL CYST 11/15/2010  . INSOMNIA UNSPECIFIED 11/15/2010  . COUGH 11/15/2010  . ABDOMINAL PAIN OTHER SPECIFIED SITE 11/15/2010  . TRANSAMINASES, SERUM, ELEVATED 11/15/2010    Dennise Bamber, PTA 04/13/2018, 11:53  AM  [facility] [address] [place] [address]Phone: [phone]  Fax:  [phone] Name: ESHARDAE KLEINMANMRN: [id number] of Birth: 925-Feb-1947

## 2018-04-16 ENCOUNTER — Encounter: Payer: Self-pay | Admitting: Physical Therapy

## 2018-04-16 ENCOUNTER — Ambulatory Visit: Payer: Medicare Other | Admitting: Physical Therapy

## 2018-04-16 DIAGNOSIS — C50412 Malignant neoplasm of upper-outer quadrant of left female breast: Secondary | ICD-10-CM | POA: Diagnosis not present

## 2018-04-16 DIAGNOSIS — G8929 Other chronic pain: Secondary | ICD-10-CM | POA: Diagnosis not present

## 2018-04-16 DIAGNOSIS — M25662 Stiffness of left knee, not elsewhere classified: Secondary | ICD-10-CM | POA: Diagnosis not present

## 2018-04-16 DIAGNOSIS — R293 Abnormal posture: Secondary | ICD-10-CM | POA: Diagnosis not present

## 2018-04-16 DIAGNOSIS — Z170 Estrogen receptor positive status [ER+]: Secondary | ICD-10-CM

## 2018-04-16 DIAGNOSIS — M25562 Pain in left knee: Principal | ICD-10-CM

## 2018-04-16 NOTE — Therapy (Signed)
[facility] [address] [place], [place], [address] Phone: [phone]   Fax:  [phone]  Physical Therapy Treatment  Patient Details  Name: [name] MRN: [id number] Date of Birth: 1946/06/18 Referring Provider: Dr. Rolm Bookbinder   Encounter Date: 04/16/2018  PT End of Session - 04/16/18 1236    Visit Number  7    Number of Visits  9    Date for PT Re-Evaluation  05/16/18    Authorization Type  medicare/tricare    PT Start Time  1230    PT Stop Time  1310    PT Time Calculation (min)  40 min    Activity Tolerance  Patient tolerated treatment well;No increased pain    Behavior During Therapy  WFL for tasks assessed/performed       Past Medical History:  Diagnosis Date  . Complication of anesthesia    aspiration pna; following a colonoscopy, pt requests head of bed elevated if possible.  . Constipation   . Cough   . Depression   . Dysrhythmia    RBBB on 06-06-17 ekg   . Elevated liver function tests   . Esophageal stricture   . Family history of breast cancer   . Gallstones   . Genetic testing 04/02/2018   STAT Breast panel with reflex to Multi-Cancer panel (83 genes) @ [facility] - No pathogenic mutations detected  . GERD (gastroesophageal reflux disease)   . Hiatal hernia   . Hyperlipemia   . Hypothyroidism   . Insomnia   . Malignant neoplasm of upper-outer quadrant of left female breast ([place])   . Nephrolithiasis   . Renal cyst     Past Surgical History:  Procedure Laterality Date  . CARDIOVASCULAR STRESS TEST  07/19/2006   EF 70%, NO EVIDENCE OF ISCHEMIA  . CHOLECYSTECTOMY  12/30/10  . COLONOSCOPY    . DILATION AND CURETTAGE OF UTERUS    . inguinal herniography    . NASAL SINUS SURGERY    . T & A    . TOOTH EXTRACTION    . TOTAL KNEE ARTHROPLASTY Left 09/11/2017   Procedure: LEFT TOTAL KNEE ARTHROPLASTY;  Surgeon: Gaynelle Arabian, MD;  Location: [facility];  Service: Orthopedics;   Laterality: Left;  with block  . US ECHOCARDIOGRAPHY  07/17/2006   EF 55-60%    There were no vitals filed for this visit.  Subjective Assessment - 04/16/18 1233    Subjective  I am going to order the melt book. I have surgery tomorrow. I will have my lumpectomy on both side.     Pertinent History  Patient was diagnoesd on 03/08/18 with lfet grade 1-2 invasive ductal carcinoma breast cancer.It measures 1.5 cm and is located in the upper outer quadrant. It is ER/PR positive and HER2 negative with a Ki67 of < 1%. She also has a finding on her MRI of a 1.1 cm area on the right breast which will be biopsied. She had a left total knee replacement in 10/18 with Dr. Wynelle Link and continues to have pain in her left lateral knee. Her orthopedist reported it is likely due to her iliotibial band.    Patient Stated Goals  For breast cancer - reduce lymphedema risk and learn post op shoulder ROM HEP; for left knee - have less pain with standing and walking, sit to stand, and descending stairs.    Currently in Pain?  Yes    Pain Score  8     Pain  Location  Knee    Pain Orientation  Left    Pain Descriptors / Indicators  Aching;Sharp;Dull    Pain Type  Chronic pain    Pain Onset  More than a month ago    Pain Frequency  Constant    Aggravating Factors   standing, stairs, walking    Pain Relieving Factors  rest    Multiple Pain Sites  No         [facility] PT Assessment - 04/16/18 0001      AROM   Left Knee Extension  0    Left Knee Flexion  130                   [facility] Adult PT Treatment/Exercise - 04/16/18 0001      Knee/Hip Exercises: Stretches   Passive Hamstring Stretch  Left;1 rep;30 seconds      Shoulder Exercises: Supine   Flexion  Strengthening;Right;Left;20 reps;Weights    Shoulder Flexion Weight (lbs)  1    Other Supine Exercises  horizontal shoulder abdution with 1# 20x; tricep 1# 20x, bicep cursl 1# 20x;  exercises done during soft tissue work      Manual Therapy   Manual  Therapy  Soft tissue mobilization    Joint Mobilization  left patella medial glide    Soft tissue mobilization  left gastroc; left quads, left patella, left ITB band left patella tendon                  PT Long Term Goals - 03/29/18 1706      PT LONG TERM GOAL #2   Title  Decrease left knee pain to </= 4/10 with walking >/= 15 minutes.    Baseline  Pain 6/10 with walking.    Time  4    Period  Weeks    Status  On-going      PT LONG TERM GOAL #3   Title  Decrease left knee pain to </= 5/10 with sit to stand.    Baseline  9/10    Time  4    Period  Weeks    Status  On-going      PT LONG TERM GOAL #4   Title  Decrease left knee pain to </= 4/10 with descending stairs.    Baseline  9/10    Time  4    Period  Weeks    Status  On-going      Breast Clinic Goals - 03/21/18 1527      Patient will be able to verbalize understanding of pertinent lymphedema risk reduction practices relevant to her diagnosis specifically related to skin care.   Time  1    Period  Days    Status  Achieved      Patient will be able to return demonstrate and/or verbalize understanding of the post-op home exercise program related to regaining shoulder range of motion.   Time  1    Period  Days    Status  Achieved      Patient will be able to verbalize understanding of the importance of attending the postoperative After Breast Cancer Class for further lymphedema risk reduction education and therapeutic exercise.   Time  1    Period  Days    Status  Achieved           Plan - 04/16/18 1317    Clinical Impression Statement  Patient able to go from sit to stand with increased hip extension.  Patient is able to stand with decreased thoracic kyphosis when she is able to walk heel toe.  Patient does better with firmer pressure on the lateral left knee to decrease pain.  Patient is going to have a lumpectomy so therapist advised patient to not do arm exercises for the next 3 days.  Patient  reports no change in left knee pain yet.  Patient will benefit from skilled therapy to improve strength and reduce pain.     Rehab Potential  Excellent    Clinical Impairments Affecting Rehab Potential  None    PT Frequency  2x / week    PT Duration  4 weeks    PT Treatment/Interventions  ADLs/Self Care Home Management;Iontophoresis 7m/ml Dexamethasone;Moist Heat;Therapeutic exercise;Therapeutic activities;Patient/family education;Passive range of motion;Scar mobilization;Manual techniques;Dry needling    PT Next Visit Plan  See how she did with her surgery; measure left knee and gastroc strength; work on pPilgrim's Prideand Agree with Plan of Care  Patient       Patient will benefit from skilled therapeutic intervention in order to improve the following deficits and impairments:  Decreased knowledge of precautions, Impaired UE functional use, Decreased range of motion, Postural dysfunction, Decreased scar mobility, Decreased mobility, Pain, Increased fascial restricitons  Visit Diagnosis: Chronic pain of left knee  Stiffness of left knee, not elsewhere classified  Malignant neoplasm of upper-outer quadrant of left breast in female, estrogen receptor positive ([place]  Abnormal posture     Problem List Patient Active Problem List   Diagnosis Date Noted  . Genetic testing 04/02/2018  . Family history of breast cancer   . Malignant ne[place] of upper-outer quadrant of left female breast ([place]   . Malignant neoplasm of upper-outer quadrant of left breast in female, estrogen receptor positive ([place] 03/16/2018  . OA (osteoarthritis) of knee 09/11/2017  . Infrapatellar bursitis of right knee 04/29/2014  . Pneumonia, aspiration ([place] 12/28/2012  . HYPOTHYROIDISM 11/15/2010  . HYPERLIPIDEMIA 11/15/2010  . DEPRESSION 11/15/2010  . GERD 11/15/2010  . CONSTIPATION 11/15/2010  . GALLSTONES 11/15/2010  . RENAL CYST 11/15/2010  . INSOMNIA UNSPECIFIED 11/15/2010  . COUGH 11/15/2010  .  ABDOMINAL PAIN OTHER SPECIFIED SITE 11/15/2010  . TRANSAMINASES, SERUM, ELEVATED 11/15/2010    CEarlie Counts PT 04/16/18 1:22 PM   [facility] [address] [place] [address]Phone: [phone]  Fax:  [phone] Name: Angela DORVALMRN: [id number] of Birth: [birth year]

## 2018-04-17 ENCOUNTER — Ambulatory Visit (HOSPITAL_BASED_OUTPATIENT_CLINIC_OR_DEPARTMENT_OTHER)
Admission: RE | Admit: 2018-04-17 | Discharge: 2018-04-17 | Disposition: A | Discharge status: Home / Self Care | Payer: Medicare Other | Source: Ambulatory Visit | Attending: General Surgery | Admitting: General Surgery

## 2018-04-17 ENCOUNTER — Ambulatory Visit (HOSPITAL_BASED_OUTPATIENT_CLINIC_OR_DEPARTMENT_OTHER): Payer: Medicare Other | Admitting: Anesthesiology

## 2018-04-17 ENCOUNTER — Encounter (HOSPITAL_BASED_OUTPATIENT_CLINIC_OR_DEPARTMENT_OTHER): Payer: Self-pay | Admitting: Anesthesiology

## 2018-04-17 ENCOUNTER — Encounter (HOSPITAL_BASED_OUTPATIENT_CLINIC_OR_DEPARTMENT_OTHER)
Admission: RE | Disposition: A | Discharge status: Home / Self Care | Payer: Self-pay | Source: Ambulatory Visit | Attending: General Surgery

## 2018-04-17 ENCOUNTER — Encounter: Payer: Medicare Other | Admitting: Physical Therapy

## 2018-04-17 ENCOUNTER — Ambulatory Visit (HOSPITAL_COMMUNITY)
Admission: RE | Admit: 2018-04-17 | Discharge: 2018-04-17 | Disposition: A | Discharge status: Home / Self Care | Payer: Medicare Other | Source: Ambulatory Visit | Attending: General Surgery | Admitting: General Surgery

## 2018-04-17 DIAGNOSIS — Z85820 Personal history of malignant melanoma of skin: Secondary | ICD-10-CM | POA: Insufficient documentation

## 2018-04-17 DIAGNOSIS — E039 Hypothyroidism, unspecified: Secondary | ICD-10-CM | POA: Diagnosis not present

## 2018-04-17 DIAGNOSIS — C50412 Malignant neoplasm of upper-outer quadrant of left female breast: Secondary | ICD-10-CM | POA: Insufficient documentation

## 2018-04-17 DIAGNOSIS — F419 Anxiety disorder, unspecified: Secondary | ICD-10-CM | POA: Diagnosis not present

## 2018-04-17 DIAGNOSIS — F329 Major depressive disorder, single episode, unspecified: Secondary | ICD-10-CM | POA: Insufficient documentation

## 2018-04-17 DIAGNOSIS — Z79890 Hormone replacement therapy (postmenopausal): Secondary | ICD-10-CM | POA: Insufficient documentation

## 2018-04-17 DIAGNOSIS — C50411 Malignant neoplasm of upper-outer quadrant of right female breast: Secondary | ICD-10-CM | POA: Diagnosis not present

## 2018-04-17 DIAGNOSIS — N6021 Fibroadenosis of right breast: Secondary | ICD-10-CM | POA: Insufficient documentation

## 2018-04-17 DIAGNOSIS — N6091 Unspecified benign mammary dysplasia of right breast: Secondary | ICD-10-CM | POA: Insufficient documentation

## 2018-04-17 DIAGNOSIS — Z79899 Other long term (current) drug therapy: Secondary | ICD-10-CM | POA: Diagnosis not present

## 2018-04-17 DIAGNOSIS — E7800 Pure hypercholesterolemia, unspecified: Secondary | ICD-10-CM | POA: Diagnosis not present

## 2018-04-17 DIAGNOSIS — Z170 Estrogen receptor positive status [ER+]: Secondary | ICD-10-CM | POA: Diagnosis not present

## 2018-04-17 DIAGNOSIS — K219 Gastro-esophageal reflux disease without esophagitis: Secondary | ICD-10-CM | POA: Insufficient documentation

## 2018-04-17 DIAGNOSIS — Z7951 Long term (current) use of inhaled steroids: Secondary | ICD-10-CM | POA: Diagnosis not present

## 2018-04-17 DIAGNOSIS — Z96652 Presence of left artificial knee joint: Secondary | ICD-10-CM | POA: Diagnosis not present

## 2018-04-17 DIAGNOSIS — N6081 Other benign mammary dysplasias of right breast: Secondary | ICD-10-CM | POA: Diagnosis not present

## 2018-04-17 DIAGNOSIS — N6489 Other specified disorders of breast: Secondary | ICD-10-CM | POA: Diagnosis not present

## 2018-04-17 DIAGNOSIS — C50912 Malignant neoplasm of unspecified site of left female breast: Secondary | ICD-10-CM | POA: Diagnosis not present

## 2018-04-17 DIAGNOSIS — Z803 Family history of malignant neoplasm of breast: Secondary | ICD-10-CM | POA: Diagnosis not present

## 2018-04-17 DIAGNOSIS — D0502 Lobular carcinoma in situ of left breast: Secondary | ICD-10-CM | POA: Diagnosis not present

## 2018-04-17 DIAGNOSIS — N6011 Diffuse cystic mastopathy of right breast: Secondary | ICD-10-CM | POA: Diagnosis not present

## 2018-04-17 DIAGNOSIS — G8918 Other acute postprocedural pain: Secondary | ICD-10-CM | POA: Diagnosis not present

## 2018-04-17 DIAGNOSIS — E785 Hyperlipidemia, unspecified: Secondary | ICD-10-CM | POA: Diagnosis not present

## 2018-04-17 DIAGNOSIS — C50911 Malignant neoplasm of unspecified site of right female breast: Secondary | ICD-10-CM | POA: Diagnosis not present

## 2018-04-17 DIAGNOSIS — N6089 Other benign mammary dysplasias of unspecified breast: Secondary | ICD-10-CM | POA: Diagnosis not present

## 2018-04-17 HISTORY — PX: BREAST LUMPECTOMY WITH RADIOACTIVE SEED AND SENTINEL LYMPH NODE BIOPSY: SHX6550

## 2018-04-17 HISTORY — PX: RADIOACTIVE SEED GUIDED EXCISIONAL BREAST BIOPSY: SHX6490

## 2018-04-17 HISTORY — PX: BREAST LUMPECTOMY: SHX2

## 2018-04-17 SURGERY — BREAST LUMPECTOMY WITH RADIOACTIVE SEED AND SENTINEL LYMPH NODE BIOPSY
Anesthesia: General | Site: Breast | Laterality: Right

## 2018-04-17 MED ORDER — CHLORHEXIDINE GLUCONATE CLOTH 2 % EX PADS: 6.0000 | MEDICATED_PAD | Freq: Once | CUTANEOUS | Status: DC

## 2018-04-17 MED ORDER — BUPIVACAINE HCL (PF) 0.25 % IJ SOLN
INTRAMUSCULAR | Status: DC | PRN
  Administered 2018-04-17: 9 mL

## 2018-04-17 MED ORDER — GABAPENTIN 100 MG PO CAPS
ORAL_CAPSULE | ORAL | Status: AC
  Filled 2018-04-17: qty 1

## 2018-04-17 MED ORDER — PHENYLEPHRINE 40 MCG/ML (10ML) SYRINGE FOR IV PUSH (FOR BLOOD PRESSURE SUPPORT)
PREFILLED_SYRINGE | INTRAVENOUS | Status: DC | PRN
  Administered 2018-04-17 (×2): 80 ug via INTRAVENOUS

## 2018-04-17 MED ORDER — MIDAZOLAM HCL 2 MG/2ML IJ SOLN
1.0000 mg | INTRAMUSCULAR | Status: DC | PRN
  Administered 2018-04-17 (×2): 1 mg via INTRAVENOUS

## 2018-04-17 MED ORDER — ROPIVACAINE HCL 7.5 MG/ML IJ SOLN
INTRAMUSCULAR | Status: DC | PRN
  Administered 2018-04-17: 20 mL via PERINEURAL

## 2018-04-17 MED ORDER — TECHNETIUM TC 99M SULFUR COLLOID FILTERED
1.0000 | Freq: Once | INTRAVENOUS | Status: AC | PRN
  Administered 2018-04-17: 1 via INTRADERMAL

## 2018-04-17 MED ORDER — FENTANYL CITRATE (PF) 100 MCG/2ML IJ SOLN
INTRAMUSCULAR | Status: AC
  Filled 2018-04-17: qty 2

## 2018-04-17 MED ORDER — TRAMADOL HCL 50 MG PO TABS: 100.0000 mg | ORAL_TABLET | Freq: Four times a day (QID) | ORAL | 0 refills | Status: DC | PRN

## 2018-04-17 MED ORDER — ENSURE PRE-SURGERY PO LIQD: 296.0000 mL | Freq: Once | ORAL | Status: DC

## 2018-04-17 MED ORDER — MIDAZOLAM HCL 2 MG/2ML IJ SOLN
INTRAMUSCULAR | Status: AC
  Filled 2018-04-17: qty 2

## 2018-04-17 MED ORDER — ACETAMINOPHEN 500 MG PO TABS
ORAL_TABLET | ORAL | Status: AC
  Filled 2018-04-17: qty 2

## 2018-04-17 MED ORDER — FENTANYL CITRATE (PF) 100 MCG/2ML IJ SOLN: 25.0000 ug | INTRAMUSCULAR | Status: DC | PRN

## 2018-04-17 MED ORDER — LACTATED RINGERS IV SOLN
INTRAVENOUS | Status: DC
  Administered 2018-04-17 (×2): via INTRAVENOUS

## 2018-04-17 MED ORDER — CEFAZOLIN SODIUM-DEXTROSE 2-4 GM/100ML-% IV SOLN
2.0000 g | INTRAVENOUS | Status: AC
  Administered 2018-04-17: 2 g via INTRAVENOUS

## 2018-04-17 MED ORDER — CEFAZOLIN SODIUM-DEXTROSE 2-4 GM/100ML-% IV SOLN
INTRAVENOUS | Status: AC
  Filled 2018-04-17: qty 100

## 2018-04-17 MED ORDER — GABAPENTIN 100 MG PO CAPS
100.0000 mg | ORAL_CAPSULE | ORAL | Status: AC
  Administered 2018-04-17: 100 mg via ORAL

## 2018-04-17 MED ORDER — LIDOCAINE HCL (CARDIAC) PF 100 MG/5ML IV SOSY
PREFILLED_SYRINGE | INTRAVENOUS | Status: AC
  Filled 2018-04-17: qty 5

## 2018-04-17 MED ORDER — SCOPOLAMINE 1 MG/3DAYS TD PT72: 1.0000 | MEDICATED_PATCH | Freq: Once | TRANSDERMAL | Status: DC | PRN

## 2018-04-17 MED ORDER — PROPOFOL 500 MG/50ML IV EMUL
INTRAVENOUS | Status: DC | PRN
  Administered 2018-04-17: 175 ug/kg/min via INTRAVENOUS

## 2018-04-17 MED ORDER — GABAPENTIN 300 MG PO CAPS
ORAL_CAPSULE | ORAL | Status: AC
  Filled 2018-04-17: qty 1

## 2018-04-17 MED ORDER — FENTANYL CITRATE (PF) 100 MCG/2ML IJ SOLN
50.0000 ug | INTRAMUSCULAR | Status: AC | PRN
  Administered 2018-04-17 (×3): 50 ug via INTRAVENOUS

## 2018-04-17 MED ORDER — DEXAMETHASONE SODIUM PHOSPHATE 4 MG/ML IJ SOLN
INTRAMUSCULAR | Status: DC | PRN
  Administered 2018-04-17: 10 mg via INTRAVENOUS

## 2018-04-17 MED ORDER — ACETAMINOPHEN 500 MG PO TABS
1000.0000 mg | ORAL_TABLET | ORAL | Status: AC
  Administered 2018-04-17: 1000 mg via ORAL

## 2018-04-17 MED ORDER — ONDANSETRON HCL 4 MG/2ML IJ SOLN: 4.0000 mg | Freq: Four times a day (QID) | INTRAMUSCULAR | Status: DC | PRN

## 2018-04-17 SURGICAL SUPPLY — 64 items
ADH SKN CLS APL DERMABOND .7 (GAUZE/BANDAGES/DRESSINGS) ×4
APPLIER CLIP 9.375 MED OPEN (MISCELLANEOUS)
APR CLP MED 9.3 20 MLT OPN (MISCELLANEOUS)
BINDER BREAST LRG (GAUZE/BANDAGES/DRESSINGS) ×3 IMPLANT
BINDER BREAST MEDIUM (GAUZE/BANDAGES/DRESSINGS) IMPLANT
BINDER BREAST XLRG (GAUZE/BANDAGES/DRESSINGS) IMPLANT
BINDER BREAST XXLRG (GAUZE/BANDAGES/DRESSINGS) IMPLANT
BLADE SURG 15 STRL LF DISP TIS (BLADE) ×2 IMPLANT
BLADE SURG 15 STRL SS (BLADE) ×4
CANISTER SUC SOCK COL 7IN (MISCELLANEOUS) IMPLANT
CANISTER SUCT 1200ML W/VALVE (MISCELLANEOUS) IMPLANT
CHLORAPREP W/TINT 26ML (MISCELLANEOUS) ×6 IMPLANT
CLIP APPLIE 9.375 MED OPEN (MISCELLANEOUS) IMPLANT
CLIP VESOCCLUDE SM WIDE 6/CT (CLIP) ×2 IMPLANT
CLOSURE WOUND 1/2 X4 (GAUZE/BANDAGES/DRESSINGS) ×1
COVER BACK TABLE 60X90IN (DRAPES) ×4 IMPLANT
COVER MAYO STAND STRL (DRAPES) ×4 IMPLANT
COVER PROBE W GEL 5X96 (DRAPES) ×4 IMPLANT
DECANTER SPIKE VIAL GLASS SM (MISCELLANEOUS) IMPLANT
DERMABOND ADVANCED (GAUZE/BANDAGES/DRESSINGS) ×4
DERMABOND ADVANCED .7 DNX12 (GAUZE/BANDAGES/DRESSINGS) ×3 IMPLANT
DEVICE DUBIN W/COMP PLATE 8390 (MISCELLANEOUS) ×12 IMPLANT
DRAPE LAPAROSCOPIC ABDOMINAL (DRAPES) ×4 IMPLANT
DRAPE UTILITY XL STRL (DRAPES) ×4 IMPLANT
DRSG TEGADERM 4X4.75 (GAUZE/BANDAGES/DRESSINGS) IMPLANT
ELECT COATED BLADE 2.86 ST (ELECTRODE) ×4 IMPLANT
ELECT REM PT RETURN 9FT ADLT (ELECTROSURGICAL) ×4
ELECTRODE REM PT RTRN 9FT ADLT (ELECTROSURGICAL) ×2 IMPLANT
GAUZE SPONGE 4X4 12PLY STRL LF (GAUZE/BANDAGES/DRESSINGS) IMPLANT
GLOVE BIO SURGEON STRL SZ7 (GLOVE) ×11 IMPLANT
GLOVE BIOGEL PI IND STRL 6.5 (GLOVE) IMPLANT
GLOVE BIOGEL PI IND STRL 7.0 (GLOVE) ×1 IMPLANT
GLOVE BIOGEL PI IND STRL 7.5 (GLOVE) ×2 IMPLANT
GLOVE BIOGEL PI INDICATOR 6.5 (GLOVE) ×2
GLOVE BIOGEL PI INDICATOR 7.0 (GLOVE) ×2
GLOVE BIOGEL PI INDICATOR 7.5 (GLOVE) ×2
GLOVE ECLIPSE 6.5 STRL STRAW (GLOVE) ×2 IMPLANT
GOWN STRL REUS W/ TWL LRG LVL3 (GOWN DISPOSABLE) ×4 IMPLANT
GOWN STRL REUS W/TWL LRG LVL3 (GOWN DISPOSABLE) ×8
HEMOSTAT ARISTA ABSORB 3G PWDR (MISCELLANEOUS) IMPLANT
ILLUMINATOR WAVEGUIDE N/F (MISCELLANEOUS) IMPLANT
KIT MARKER MARGIN INK (KITS) ×4 IMPLANT
LIGHT WAVEGUIDE WIDE FLAT (MISCELLANEOUS) IMPLANT
NDL HYPO 25X1 1.5 SAFETY (NEEDLE) ×2 IMPLANT
NEEDLE HYPO 25X1 1.5 SAFETY (NEEDLE) ×4 IMPLANT
NS IRRIG 1000ML POUR BTL (IV SOLUTION) IMPLANT
PACK BASIN DAY SURGERY FS (CUSTOM PROCEDURE TRAY) ×4 IMPLANT
PENCIL BUTTON HOLSTER BLD 10FT (ELECTRODE) ×4 IMPLANT
SLEEVE SCD COMPRESS KNEE MED (MISCELLANEOUS) ×4 IMPLANT
SPONGE LAP 4X18 X RAY DECT (DISPOSABLE) ×4 IMPLANT
STRIP CLOSURE SKIN 1/2X4 (GAUZE/BANDAGES/DRESSINGS) ×3 IMPLANT
SUT MNCRL AB 4-0 PS2 18 (SUTURE) ×6 IMPLANT
SUT MON AB 5-0 PS2 18 (SUTURE) ×2 IMPLANT
SUT SILK 2 0 SH (SUTURE) ×4 IMPLANT
SUT VIC AB 2-0 SH 27 (SUTURE) ×8
SUT VIC AB 2-0 SH 27XBRD (SUTURE) ×3 IMPLANT
SUT VIC AB 3-0 SH 27 (SUTURE) ×8
SUT VIC AB 3-0 SH 27X BRD (SUTURE) ×2 IMPLANT
SYR CONTROL 10ML LL (SYRINGE) ×4 IMPLANT
TOWEL OR 17X24 6PK STRL BLUE (TOWEL DISPOSABLE) ×2 IMPLANT
TOWEL OR NON WOVEN STRL DISP B (DISPOSABLE) ×4 IMPLANT
TUBE CONNECTING 20'X1/4 (TUBING)
TUBE CONNECTING 20X1/4 (TUBING) IMPLANT
YANKAUER SUCT BULB TIP NO VENT (SUCTIONS) IMPLANT

## 2018-04-17 NOTE — Anesthesia Procedure Notes (Signed)
Procedure Name: LMA Insertion Date/Time: 04/17/2018 12:06 PM Performed by: Maryella Shivers, CRNA Pre-anesthesia Checklist: Patient identified, Emergency Drugs available, Suction available and Patient being monitored Patient Re-evaluated:Patient Re-evaluated prior to induction Oxygen Delivery Method: Circle system utilized Preoxygenation: Pre-oxygenation with 100% oxygen Induction Type: IV induction Ventilation: Mask ventilation without difficulty LMA: LMA inserted LMA Size: 4.0 Number of attempts: 1 Placement Confirmation: positive ETCO2 and breath sounds checked- equal and bilateral Tube secured with: Tape Dental Injury: Teeth and Oropharynx as per pre-operative assessment

## 2018-04-17 NOTE — H&P (Signed)
73 yof referred by Dr Virgina Jock for new left breast cancer. she has fh in her mother. she had no mass or dc. she underwent mm that shows d density breasts. she has a left breast distortion noted. she underwent US that showed a 1.5 cm mass at 2 oclock. the axilla is negative by ultrasound. MRI done per patient request shows a right side with a couple small nodules in ruiq. these measure 5 mm. on the left there is irregular mass at posterior aspect of uoq measuring 1.3x1.5x1.7 cm in size. there is linear enhancement extending laterally and inferiorly from this mass. this measures in total 3.3 cm. 2.1 cm anterior and lateral to the mass is a 11x5x8 mm mass. there are no abnormal nodes by mri. core biopsy of the left breast mass is a grade I-II IDC with DCIS that is er/pr pos, her2 negative and Ki is <1%. she has undergone a left knee replacement in October. has history of rbbb and an aspiration pna during a colonoscopy years ago. since I have last seen her genetic testing is negative. she has an US guided biopsy of the other left sided breast mass and this is invasive mammary carcinoma not otherwise specified now. the two right sided lesions underwent mr biopsy and one is alh and the other is fcc  Past Surgical History Tawni Pummel, RN; 03/21/2018 7:39 AM) Breast Biopsy  Left. multiple Gallbladder Surgery - Laparoscopic  Knee Surgery  Left. Open Inguinal Hernia Surgery  Left. Oral Surgery  Tonsillectomy   Diagnostic Studies History Tawni Pummel, RN; 03/21/2018 7:39 AM) Colonoscopy  5-10 years ago Mammogram  within last year Pap Smear  1-5 years ago  Medication History Tawni Pummel, RN; 03/21/2018 7:39 AM) Medications Reconciled  Social History Tawni Pummel, RN; 03/21/2018 7:39 AM) Alcohol use  Remotely quit alcohol use. Caffeine use  Coffee. No drug use  Tobacco use  Never smoker.  Family History Tawni Pummel, RN; 03/21/2018 7:39 AM) Breast Cancer  Family  Members In General, Mother. Cerebrovascular Accident  Family Members In General. Heart Disease  Family Members In General. Melanoma  Brother. Respiratory Condition  Father. Seizure disorder  Family Members In General.  Pregnancy / Birth History Tawni Pummel, RN; 03/21/2018 7:39 AM) Age at menarche  37 years. Age of menopause  32-50 Contraceptive History  Oral contraceptives. Irregular periods   Other Problems Tawni Pummel, RN; 03/21/2018 7:39 AM) Anxiety Disorder  Arthritis  Back Pain  Bladder Problems  Breast Cancer  Cholelithiasis  Gastroesophageal Reflux Disease  Hypercholesterolemia  Kidney Stone  Lump In Breast  Melanoma  Other disease, cancer, significant illness  Pancreatitis  Thyroid Disease  Ventral Hernia Repair   Review of Systems Sunday Spillers Ledford RN; 03/21/2018 7:39 AM) General Present- Fatigue and Weight Loss. Not Present- Appetite Loss, Chills, Fever, Night Sweats and Weight Gain. Skin Present- Dryness and Rash. Not Present- Change in Wart/Mole, Hives, Jaundice, New Lesions, Non-Healing Wounds and Ulcer. HEENT Present- Hearing Loss, Hoarseness, Ringing in the Ears, Seasonal Allergies, Visual Disturbances and Wears glasses/contact lenses. Not Present- Earache, Nose Bleed, Oral Ulcers, Sinus Pain, Sore Throat and Yellow Eyes. Respiratory Present- Snoring. Not Present- Bloody sputum, Chronic Cough, Difficulty Breathing and Wheezing. Breast Not Present- Breast Mass, Breast Pain, Nipple Discharge and Skin Changes. Cardiovascular Not Present- Chest Pain, Difficulty Breathing Lying Down, Leg Cramps, Palpitations, Rapid Heart Rate, Shortness of Breath and Swelling of Extremities. Gastrointestinal Present- Constipation, Nausea and Vomiting. Not Present- Abdominal Pain, Bloating, Bloody Stool, Change in Bowel Habits, Chronic diarrhea,  Difficulty Swallowing, Excessive gas, Gets full quickly at meals, Hemorrhoids, Indigestion and Rectal Pain. Female  Genitourinary Present- Nocturia. Not Present- Frequency, Painful Urination, Pelvic Pain and Urgency. Musculoskeletal Present- Back Pain, Joint Pain, Joint Stiffness and Swelling of Extremities. Not Present- Muscle Pain and Muscle Weakness. Neurological Present- Tingling. Not Present- Decreased Memory, Fainting, Headaches, Numbness, Seizures, Tremor, Trouble walking and Weakness. Psychiatric Present- Anxiety. Not Present- Bipolar, Change in Sleep Pattern, Depression, Fearful and Frequent crying. Endocrine Present- Hot flashes. Not Present- Cold Intolerance, Excessive Hunger, Hair Changes, Heat Intolerance and New Diabetes. Hematology Not Present- Blood Thinners, Easy Bruising, Excessive bleeding, Gland problems, HIV and Persistent Infections.   Physical Exam Rolm Bookbinder MD; 03/21/2018 8:54 PM) General Mental Status-Alert. Head and Neck Trachea-midline. Thyroid Gland Characteristics - normal size and consistency. Eye Sclera/Conjunctiva - Bilateral-No scleral icterus. Chest and Lung Exam Chest and lung exam reveals -quiet, even and easy respiratory effort with no use of accessory muscles and on auscultation, normal breath sounds, no adventitious sounds and normal vocal resonance. Breast Nipples-No Discharge. Breast Lump-No Palpable Breast Mass. Note: left breast hematoma present Cardiovascular Cardiovascular examination reveals -normal heart sounds, regular rate and rhythm with no murmurs. Abdomen Note: soft no hepatomegaly Neuropsychiatric Mental status exam performed with findings of-Oriented X3 with appropriate mood and affect. Lymphatic Head & Neck General Head & Neck Lymphatics: Bilateral - Description - Normal. Axillary General Axillary Region: Bilateral - Description - Normal. Note: no [place] adenopathy   Assessment & Plan Rolm Bookbinder MD; 03/21/2018 9:04 PM) BREAST CANCER OF UPPER-OUTER QUADRANT OF LEFT FEMALE BREAST (C50.412) Left breast seed  bracketed lumpectomy, left ax sn biopsy, right seed guided excisional biopsy We discussed the staging and pathophysiology of breast cancer. We discussed all of the different options for treatment for breast cancer including surgery, chemotherapy, radiation therapy, Herceptin, and antiestrogen therapy. We discussed a sentinel lymph node biopsy as she does not appear to having lymph node involvement right now. We discussed the performance of that with injection of radioactive tracer. We discussed that she would have an incision underneath her axillary hairline. We discussed that there is a chance of having a positive node with a sentinel lymph node biopsy and we will await the permanent pathology to make any other first further decisions in terms of her treatment. One of these options might be to return to the operating room to perform an axillary lymph node dissection. We discussed up to a 5% risk lifetime of chronic shoulder pain as well as lymphedema associated with a sentinel lymph node biopsy. We discussed the options for treatment of the breast cancer which included lumpectomy versus a mastectomy. We discussed the performance of the lumpectomy with radioactive seed placement. We discussed a 5-10% chance of a positive margin requiring reexcision in the operating room. We also discussed that she will need radiation therapy if she undergoes lumpectomy. We discussed the mastectomy (removal of whole breast) and the postoperative care for that as well. Mastectomy can be followed by reconstruction. This is a more extensive surgery and requires more recovery. The decision for lumpectomy vs mastectomy has no impact on decision for chemotherapy and no real difference in survival or recurrence. will need to await further biopsies to determine what local therapy possible. will have her see plastics if mastectomy indicated. I wil follow up with her after other biopsies.

## 2018-04-17 NOTE — Anesthesia Procedure Notes (Signed)
Anesthesia Regional Block: Pectoralis block   Pre-Anesthetic Checklist: ,, timeout performed, Correct Patient, Correct Site, Correct Laterality, Correct Procedure, Correct Position, site marked, Risks and benefits discussed,  Surgical consent,  Pre-op evaluation,  At surgeon's request and post-op pain management  Laterality: Left  Prep: chloraprep       Needles:  Injection technique: Single-shot  Needle Type: Echogenic Needle     Needle Length: 9cm  Needle Gauge: 21     Additional Needles:   Narrative:  Start time: 04/17/2018 11:30 AM End time: 04/17/2018 11:38 AM Injection made incrementally with aspirations every 5 mL.  Performed by: Personally  Anesthesiologist: Albertha Ghee, MD  Additional Notes: Pt tolerated the procedure well.

## 2018-04-17 NOTE — Op Note (Signed)
Preoperative diagnosis: Left breast cancer, clinical stage I Right breast mammographic lesion Postoperative diagnosis: Same as above Procedure: 1.  Left breast seed bracketed lumpectomy 2.  Left deep axillary sentinel lymph node biopsy 3.  Right breast seed guided excisional biopsy Surgeon: Dr. Serita Grammes Anesthesia: General with left pectoral block Estimated blood loss: Minimal Complications: None Drains: None Specimens: 1.  Right breast seed guided excision with clip and seed present 2.  Left breast bracketed breast tissue with 2 seeds and 2 clips present 3.  Additional left breast inferior margin 4.  Left axillary sentinel lymph nodes with highest count of 112 Sponge needle count was correct x2 at end of operation Disposition to recovery stable  Indications: This is a 72 year old female who has 2 upper outer quadrant left breast masses with an MRI abnormality in between.  Both breast masses have been biopsied are invasive ductal cancer.  She has clips at both of these locations.  She has negative nodes preoperatively.  She also per her request had undergone an MRI that showed a right breast lesion.  This was biopsied and was a high risk lesion.  This was recommended for excision.  She had seeds placed at all 3 of these locations as she desired an attempt at breast conservation therapy.  I had the mammograms in the operating room.  Procedure: After informed consent was obtained the  patient underwent a left pectoral block.  She was given antibiotics.  She had SCDs in place.  Technetium was injected in the standard periareolar fashion.  She was then placed under general anesthesia without complication.  Her bilateral breasts were then prepped and draped in the standard sterile surgical fashion.  A surgical timeout was then performed.  I first approached the right side.  I identified the seed.  I infiltrated Marcaine around the areola.  I made a periareolar incision in order to hide the  scar.  I then used the neoprobe to guide excision of the seed and the surrounding tissue.  I did not go far around the seed as this was a benign lesion.  This was confirmed on mammography.  Hemostasis was obtained.  I closed this with 2-0 Vicryl, 3-0 Vicryl, and 5-0 Monocryl.  I then placed glue.  I then located both seeds in the upper outer quadrant of the left breast.  1 of them was not very far from the skin.  I elected to make a curvilinear incision overlying this lesion.  I infiltrated Marcaine in the area of the incision.  I then used cautery to dissect around both seeds and to remove the intervening tissue.  This was all removed and marked with paint.  Mammogram confirmed removal of both seeds and both clips.  I thought it might be close in the inferior margin I remove this.  This was marked with a short stitch  superior, long stitch lateral, double stitch deep.  I then placed clips around this cavity.  I closed down the breast tissue with 2-0 Vicryl.  The skin was closed with 3-0 Vicryl and 4-0 Monocryl.  Dermabond and Steri-Strips were eventually applied.  I located radioactivity in the low axilla.  I then made a curvilinear incision below the axillary hairline after infiltrating Marcaine.  I dissected through the axillary fascia.  I identified what appeared to be several small radioactive lymph nodes.  I remove this and there was really no remaining radioactivity in the axilla.  Hemostasis was observed.  I closed the axillary fascia with  2-0 Vicryl.  The skin was closed with 3-0 Vicryl and 4-0 Monocryl.  Glue and Steri-Strips were applied.  She tolerated this well was extubated and transferred to the recovery room in stable condition.

## 2018-04-17 NOTE — Discharge Instructions (Signed)
Post Anesthesia Home Care Instructions  Activity: Get plenty of rest for the remainder of the day. A responsible individual must stay with you for 24 hours following the procedure.  For the next 24 hours, DO NOT: -Drive a car -Paediatric nurse -Drink alcoholic beverages -Take any medication unless instructed by your physician -Make any legal decisions or sign important papers.  Meals: Start with liquid foods such as gelatin or soup. Progress to regular foods as tolerated. Avoid greasy, spicy, heavy foods. If nausea and/or vomiting occur, drink only clear liquids until the nausea and/or vomiting subsides. Call your physician if vomiting continues.  Special Instructions/Symptoms: Your throat may feel dry or sore from the anesthesia or the breathing tube placed in your throat during surgery. If this causes discomfort, gargle with warm salt water. The discomfort should disappear within 24 hours.  If you had a scopolamine patch placed behind your ear for the management of post- operative nausea and/or vomiting:  1. The medication in the patch is effective for 72 hours, after which it should be removed.  Wrap patch in a tissue and discard in the trash. Wash hands thoroughly with soap and water. 2. You may remove the patch earlier than 72 hours if you experience unpleasant side effects which may include dry mouth, dizziness or visual disturbances. 3. Avoid touching the patch. Wash your hands with soap and water after contact with the patch.   [place] Office Phone Number [phone]   POST OP INSTRUCTIONS  Always review your discharge instruction sheet given to you by the facility where your surgery was performed.  IF YOU HAVE DISABILITY OR FAMILY LEAVE FORMS, YOU MUST BRING THEM TO THE OFFICE FOR PROCESSING.  DO NOT GIVE THEM TO YOUR DOCTOR.  Take either tylenol 650 mg every six hours for next 3 days or ibuprofen 400 mg every 8 hours for next three days. Use the  tramadol as a backup. Use ice tonight and tomorrow also.  1. A prescription for pain medication may be given to you upon discharge.  Take your pain medication as prescribed, if needed.  If narcotic pain medicine is not needed, then you may take acetaminophen (Tylenol), naprosyn (Alleve) or ibuprofen (Advil) as needed. 2. Take your usually prescribed medications unless otherwise directed 3. If you need a refill on your pain medication, please contact your pharmacy.  They will contact our office to request authorization.  Prescriptions will not be filled after 5pm or on week-ends. 4. You should eat very light the first 24 hours after surgery, such as soup, crackers, pudding, etc.  Resume your normal diet the day after surgery. 5. Most patients will experience some swelling and bruising in the breast.  Ice packs and a good support bra will help.  Wear the breast binder provided or a sports bra for 72 hours day and night.  After that wear a sports bra during the day until you return to the office. Swelling and bruising can take several days to resolve.  6. It is common to experience some constipation if taking pain medication after surgery.  Increasing fluid intake and taking a stool softener will usually help or prevent this problem from occurring.  A mild laxative (Milk of Magnesia or Miralax) should be taken according to package directions if there are no bowel movements after 48 hours. 7. Unless discharge instructions indicate otherwise, you may remove your bandages 48 hours after surgery and you may shower at that time.  You may have steri-strips (small  skin tapes) in place directly over the incision.  These strips should be left on the skin for 7-10 days and will come off on their own.  If your surgeon used skin glue on the incision, you may shower in 24 hours.  The glue will flake off over the next 2-3 weeks.  Any sutures or staples will be removed at the office during your follow-up  visit. 8. ACTIVITIES:  You may resume regular daily activities (gradually increasing) beginning the next day.  Wearing a good support bra or sports bra minimizes pain and swelling.  You may have sexual intercourse when it is comfortable. a. You may drive when you no longer are taking prescription pain medication, you can comfortably wear a seatbelt, and you can safely maneuver your car and apply brakes. b. RETURN TO WORK:  ______________________________________________________________________________________ 9. You should see your doctor in the office for a follow-up appointment approximately two weeks after your surgery.  Your doctors nurse will typically make your follow-up appointment when she calls you with your pathology report.  Expect your pathology report 3-4 business days after your surgery.  You may call to check if you do not hear from [place] after three days. 10. OTHER INSTRUCTIONS: _______________________________________________________________________________________________ _____________________________________________________________________________________________________________________________________ _____________________________________________________________________________________________________________________________________ _____________________________________________________________________________________________________________________________________  WHEN TO CALL DR WAKEFIELD: 1. Fever over 101.0 2. Nausea and/or vomiting. 3. Extreme swelling or bruising. 4. Continued bleeding from incision. 5. Increased pain, redness, or drainage from the incision.  The clinic staff is available to answer your questions during regular business hours.  Please dont hesitate to call and ask to speak to one of the nurses for clinical concerns.  If you have a medical emergency, go to the nearest emergency room or call 911.  A surgeon from [facility] is always on call at the  hospital.  For further questions, please visit [email or web address]

## 2018-04-17 NOTE — H&P (View-Only) (Signed)
56 yof referred by Dr Virgina Jock for new left breast cancer. she has fh in her mother. she had no mass or dc. she underwent mm that shows d density breasts. she has a left breast distortion noted. she underwent US that showed a 1.5 cm mass at 2 oclock. the axilla is negative by ultrasound. MRI done per patient request shows a right side with a couple small nodules in ruiq. these measure 5 mm. on the left there is irregular mass at posterior aspect of uoq measuring 1.3x1.5x1.7 cm in size. there is linear enhancement extending laterally and inferiorly from this mass. this measures in total 3.3 cm. 2.1 cm anterior and lateral to the mass is a 11x5x8 mm mass. there are no abnormal nodes by mri. core biopsy of the left breast mass is a grade I-II IDC with DCIS that is er/pr pos, her2 negative and Ki is <1%. she has undergone a left knee replacement in October. has history of rbbb and an aspiration pna during a colonoscopy years ago. since I have last seen her genetic testing is negative. she has an US guided biopsy of the other left sided breast mass and this is invasive mammary carcinoma not otherwise specified now. the two right sided lesions underwent mr biopsy and one is alh and the other is fcc  Past Surgical History Tawni Pummel, RN; 03/21/2018 7:39 AM) Breast Biopsy  Left. multiple Gallbladder Surgery - Laparoscopic  Knee Surgery  Left. Open Inguinal Hernia Surgery  Left. Oral Surgery  Tonsillectomy   Diagnostic Studies History Tawni Pummel, RN; 03/21/2018 7:39 AM) Colonoscopy  5-10 years ago Mammogram  within last year Pap Smear  1-5 years ago  Medication History Tawni Pummel, RN; 03/21/2018 7:39 AM) Medications Reconciled  Social History Tawni Pummel, RN; 03/21/2018 7:39 AM) Alcohol use  Remotely quit alcohol use. Caffeine use  Coffee. No drug use  Tobacco use  Never smoker.  Family History Tawni Pummel, RN; 03/21/2018 7:39 AM) Breast Cancer  Family  Members In General, Mother. Cerebrovascular Accident  Family Members In General. Heart Disease  Family Members In General. Melanoma  Brother. Respiratory Condition  Father. Seizure disorder  Family Members In General.  Pregnancy / Birth History Tawni Pummel, RN; 03/21/2018 7:39 AM) Age at menarche  28 years. Age of menopause  31-50 Contraceptive History  Oral contraceptives. Irregular periods   Other Problems Tawni Pummel, RN; 03/21/2018 7:39 AM) Anxiety Disorder  Arthritis  Back Pain  Bladder Problems  Breast Cancer  Cholelithiasis  Gastroesophageal Reflux Disease  Hypercholesterolemia  Kidney Stone  Lump In Breast  Melanoma  Other disease, cancer, significant illness  Pancreatitis  Thyroid Disease  Ventral Hernia Repair   Review of Systems Sunday Spillers Ledford RN; 03/21/2018 7:39 AM) General Present- Fatigue and Weight Loss. Not Present- Appetite Loss, Chills, Fever, Night Sweats and Weight Gain. Skin Present- Dryness and Rash. Not Present- Change in Wart/Mole, Hives, Jaundice, New Lesions, Non-Healing Wounds and Ulcer. HEENT Present- Hearing Loss, Hoarseness, Ringing in the Ears, Seasonal Allergies, Visual Disturbances and Wears glasses/contact lenses. Not Present- Earache, Nose Bleed, Oral Ulcers, Sinus Pain, Sore Throat and Yellow Eyes. Respiratory Present- Snoring. Not Present- Bloody sputum, Chronic Cough, Difficulty Breathing and Wheezing. Breast Not Present- Breast Mass, Breast Pain, Nipple Discharge and Skin Changes. Cardiovascular Not Present- Chest Pain, Difficulty Breathing Lying Down, Leg Cramps, Palpitations, Rapid Heart Rate, Shortness of Breath and Swelling of Extremities. Gastrointestinal Present- Constipation, Nausea and Vomiting. Not Present- Abdominal Pain, Bloating, Bloody Stool, Change in Bowel Habits, Chronic diarrhea,  Difficulty Swallowing, Excessive gas, Gets full quickly at meals, Hemorrhoids, Indigestion and Rectal Pain. Female  Genitourinary Present- Nocturia. Not Present- Frequency, Painful Urination, Pelvic Pain and Urgency. Musculoskeletal Present- Back Pain, Joint Pain, Joint Stiffness and Swelling of Extremities. Not Present- Muscle Pain and Muscle Weakness. Neurological Present- Tingling. Not Present- Decreased Memory, Fainting, Headaches, Numbness, Seizures, Tremor, Trouble walking and Weakness. Psychiatric Present- Anxiety. Not Present- Bipolar, Change in Sleep Pattern, Depression, Fearful and Frequent crying. Endocrine Present- Hot flashes. Not Present- Cold Intolerance, Excessive Hunger, Hair Changes, Heat Intolerance and New Diabetes. Hematology Not Present- Blood Thinners, Easy Bruising, Excessive bleeding, Gland problems, HIV and Persistent Infections.   Physical Exam Rolm Bookbinder MD; 03/21/2018 8:54 PM) General Mental Status-Alert. Head and Neck Trachea-midline. Thyroid Gland Characteristics - normal size and consistency. Eye Sclera/Conjunctiva - Bilateral-No scleral icterus. Chest and Lung Exam Chest and lung exam reveals -quiet, even and easy respiratory effort with no use of accessory muscles and on auscultation, normal breath sounds, no adventitious sounds and normal vocal resonance. Breast Nipples-No Discharge. Breast Lump-No Palpable Breast Mass. Note: left breast hematoma present Cardiovascular Cardiovascular examination reveals -normal heart sounds, regular rate and rhythm with no murmurs. Abdomen Note: soft no hepatomegaly Neuropsychiatric Mental status exam performed with findings of-Oriented X3 with appropriate mood and affect. Lymphatic Head & Neck General Head & Neck Lymphatics: Bilateral - Description - Normal. Axillary General Axillary Region: Bilateral - Description - Normal. Note: no [place] adenopathy   Assessment & Plan Rolm Bookbinder MD; 03/21/2018 9:04 PM) BREAST CANCER OF UPPER-OUTER QUADRANT OF LEFT FEMALE BREAST (C50.412) Left breast seed  bracketed lumpectomy, left ax sn biopsy, right seed guided excisional biopsy We discussed the staging and pathophysiology of breast cancer. We discussed all of the different options for treatment for breast cancer including surgery, chemotherapy, radiation therapy, Herceptin, and antiestrogen therapy. We discussed a sentinel lymph node biopsy as she does not appear to having lymph node involvement right now. We discussed the performance of that with injection of radioactive tracer. We discussed that she would have an incision underneath her axillary hairline. We discussed that there is a chance of having a positive node with a sentinel lymph node biopsy and we will await the permanent pathology to make any other first further decisions in terms of her treatment. One of these options might be to return to the operating room to perform an axillary lymph node dissection. We discussed up to a 5% risk lifetime of chronic shoulder pain as well as lymphedema associated with a sentinel lymph node biopsy. We discussed the options for treatment of the breast cancer which included lumpectomy versus a mastectomy. We discussed the performance of the lumpectomy with radioactive seed placement. We discussed a 5-10% chance of a positive margin requiring reexcision in the operating room. We also discussed that she will need radiation therapy if she undergoes lumpectomy. We discussed the mastectomy (removal of whole breast) and the postoperative care for that as well. Mastectomy can be followed by reconstruction. This is a more extensive surgery and requires more recovery. The decision for lumpectomy vs mastectomy has no impact on decision for chemotherapy and no real difference in survival or recurrence. will need to await further biopsies to determine what local therapy possible. will have her see plastics if mastectomy indicated. I wil follow up with her after other biopsies.

## 2018-04-17 NOTE — Progress Notes (Signed)
Assisted Dr. Marcie Bal with left, ultrasound guided, pectoralis block. Side rails up, monitors on throughout procedure. See vital signs in flow sheet. Tolerated Procedure well.

## 2018-04-17 NOTE — Transfer of Care (Signed)
Immediate Anesthesia Transfer of Care Note  Patient: [name]  Procedure(s) Performed: LEFT BREAST LUMPECTOMY WITH BRACKETED RADIOACTIVE SEEDS AND SENTINEL LYMPH NODE BIOPSY (Left Breast) RIGHT BREAST SEED GUIDED EXCISIONAL BIOPSY (Right Breast)  Patient Location: PACU  Anesthesia Type:GA combined with regional for post-op pain  Level of Consciousness: sedated  Airway & Oxygen Therapy: Patient Spontanous Breathing and Patient connected to face mask oxygen  Post-op Assessment: Report given to RN and Post -op Vital signs reviewed and stable  Post vital signs: Reviewed and stable  Last Vitals:  Vitals Value Taken Time  BP 137/88 04/17/2018  1:26 PM  Temp    Pulse 86 04/17/2018  1:30 PM  Resp 11 04/17/2018  1:30 PM  SpO2 100 % 04/17/2018  1:30 PM  Vitals shown include unvalidated device data.  Last Pain:  Vitals:   04/17/18 1047  TempSrc: Oral  PainSc: 2          Complications: No apparent anesthesia complications

## 2018-04-17 NOTE — Anesthesia Preprocedure Evaluation (Addendum)
Anesthesia Evaluation  Patient identified by MRN, date of birth, ID band Patient awake    Reviewed: Allergy & Precautions, H&P , NPO status , Patient's Chart, lab work & pertinent test results  Airway Mallampati: II   Neck ROM: full    Dental   Pulmonary  H/o apiration PNA s/p colonoscopy   breath sounds clear to auscultation       Cardiovascular + dysrhythmias  Rhythm:regular Rate:Normal  RBBB   Neuro/Psych PSYCHIATRIC DISORDERS Depression  Neuromuscular disease    GI/Hepatic hiatal hernia, GERD  ,  Endo/Other  Hypothyroidism   Renal/GU      Musculoskeletal  (+) Arthritis ,   Abdominal   Peds  Hematology   Anesthesia Other Findings   Reproductive/Obstetrics                             Anesthesia Physical Anesthesia Plan  ASA: II  Anesthesia Plan: General   Post-op Pain Management:  Regional for Post-op pain   Induction: Intravenous  PONV Risk Score and Plan: 3 and Ondansetron, Dexamethasone, Midazolam and Treatment may vary due to age or medical condition  Airway Management Planned: Oral ETT  Additional Equipment:   Intra-op Plan:   Post-operative Plan: Extubation in OR  Informed Consent: I have reviewed the patients History and Physical, chart, labs and discussed the procedure including the risks, benefits and alternatives for the proposed anesthesia with the patient or authorized representative who has indicated his/her understanding and acceptance.     Plan Discussed with: CRNA, Anesthesiologist and Surgeon  Anesthesia Plan Comments:        Anesthesia Quick Evaluation

## 2018-04-17 NOTE — Anesthesia Postprocedure Evaluation (Signed)
Anesthesia Post Note  Patient: LAQUITA HARLAN  Procedure(s) Performed: LEFT BREAST LUMPECTOMY WITH BRACKETED RADIOACTIVE SEEDS AND SENTINEL LYMPH NODE BIOPSY (Left Breast) RIGHT BREAST SEED GUIDED EXCISIONAL BIOPSY (Right Breast)     Patient location during evaluation: PACU Anesthesia Type: General Level of consciousness: awake and alert and oriented Pain management: pain level controlled Vital Signs Assessment: post-procedure vital signs reviewed and stable Respiratory status: spontaneous breathing, nonlabored ventilation and respiratory function stable Cardiovascular status: blood pressure returned to baseline and stable Postop Assessment: no apparent nausea or vomiting Anesthetic complications: no    Last Vitals:  Vitals:   04/17/18 1331 04/17/18 1345  BP: (!) 160/93 (!) 143/77  Pulse: 88 84  Resp: 15 12  Temp:    SpO2: 100% 100%    Last Pain:  Vitals:   04/17/18 1326  TempSrc:   PainSc: 0-No pain                 Dacota Devall A.

## 2018-04-17 NOTE — Interval H&P Note (Signed)
History and Physical Interval Note:  04/17/2018 11:44 AM  [name]  has presented today for surgery, with the diagnosis of LEFT BREAST CANCER  The various methods of treatment have been discussed with the patient and family. After consideration of risks, benefits and other options for treatment, the patient has consented to  Procedure(s): LEFT BREAST LUMPECTOMY WITH BRACKETED RADIOACTIVE SEEDS AND SENTINEL LYMPH NODE BIOPSY (Left) RIGHT BREAST SEED GUIDED EXCISIONAL BIOPSY (Right) as a surgical intervention .  The patient's history has been reviewed, patient examined, no change in status, stable for surgery.  I have reviewed the patient's chart and labs.  Questions were answered to the patient's satisfaction.     Rolm Bookbinder

## 2018-04-18 ENCOUNTER — Encounter (HOSPITAL_BASED_OUTPATIENT_CLINIC_OR_DEPARTMENT_OTHER): Payer: Self-pay | Admitting: General Surgery

## 2018-04-19 ENCOUNTER — Encounter: Payer: Medicare Other | Admitting: Physical Therapy

## 2018-04-20 ENCOUNTER — Telehealth: Payer: Self-pay | Admitting: *Deleted

## 2018-04-20 DIAGNOSIS — N6091 Unspecified benign mammary dysplasia of right breast: Secondary | ICD-10-CM | POA: Diagnosis not present

## 2018-04-20 DIAGNOSIS — F419 Anxiety disorder, unspecified: Secondary | ICD-10-CM | POA: Diagnosis not present

## 2018-04-20 DIAGNOSIS — E7800 Pure hypercholesterolemia, unspecified: Secondary | ICD-10-CM | POA: Diagnosis not present

## 2018-04-20 DIAGNOSIS — N6021 Fibroadenosis of right breast: Secondary | ICD-10-CM | POA: Diagnosis not present

## 2018-04-20 DIAGNOSIS — Z79899 Other long term (current) drug therapy: Secondary | ICD-10-CM | POA: Diagnosis not present

## 2018-04-20 DIAGNOSIS — C50412 Malignant neoplasm of upper-outer quadrant of left female breast: Secondary | ICD-10-CM | POA: Diagnosis not present

## 2018-04-20 DIAGNOSIS — F329 Major depressive disorder, single episode, unspecified: Secondary | ICD-10-CM | POA: Diagnosis not present

## 2018-04-20 DIAGNOSIS — K219 Gastro-esophageal reflux disease without esophagitis: Secondary | ICD-10-CM | POA: Diagnosis not present

## 2018-04-20 DIAGNOSIS — E039 Hypothyroidism, unspecified: Secondary | ICD-10-CM | POA: Diagnosis not present

## 2018-04-20 DIAGNOSIS — Z803 Family history of malignant neoplasm of breast: Secondary | ICD-10-CM | POA: Diagnosis not present

## 2018-04-20 DIAGNOSIS — Z79890 Hormone replacement therapy (postmenopausal): Secondary | ICD-10-CM | POA: Diagnosis not present

## 2018-04-20 DIAGNOSIS — Z7951 Long term (current) use of inhaled steroids: Secondary | ICD-10-CM | POA: Diagnosis not present

## 2018-04-20 NOTE — Telephone Encounter (Signed)
Ordered oncotype per Dr. Magrinat.  Faxed requisition to pathology. 

## 2018-04-23 ENCOUNTER — Other Ambulatory Visit: Payer: Self-pay | Admitting: General Surgery

## 2018-04-23 ENCOUNTER — Encounter: Payer: Medicare Other | Admitting: Physical Therapy

## 2018-04-25 ENCOUNTER — Encounter: Payer: Medicare Other | Admitting: Physical Therapy

## 2018-04-30 DIAGNOSIS — D1801 Hemangioma of skin and subcutaneous tissue: Secondary | ICD-10-CM | POA: Diagnosis not present

## 2018-04-30 DIAGNOSIS — Z85828 Personal history of other malignant neoplasm of skin: Secondary | ICD-10-CM | POA: Diagnosis not present

## 2018-04-30 DIAGNOSIS — L814 Other melanin hyperpigmentation: Secondary | ICD-10-CM | POA: Diagnosis not present

## 2018-04-30 DIAGNOSIS — D225 Melanocytic nevi of trunk: Secondary | ICD-10-CM | POA: Diagnosis not present

## 2018-04-30 DIAGNOSIS — L821 Other seborrheic keratosis: Secondary | ICD-10-CM | POA: Diagnosis not present

## 2018-04-30 DIAGNOSIS — L820 Inflamed seborrheic keratosis: Secondary | ICD-10-CM | POA: Diagnosis not present

## 2018-04-30 DIAGNOSIS — N840 Polyp of corpus uteri: Secondary | ICD-10-CM | POA: Diagnosis not present

## 2018-04-30 DIAGNOSIS — C50919 Malignant neoplasm of unspecified site of unspecified female breast: Secondary | ICD-10-CM | POA: Diagnosis not present

## 2018-05-09 ENCOUNTER — Other Ambulatory Visit: Payer: Self-pay

## 2018-05-09 ENCOUNTER — Ambulatory Visit: Payer: Medicare Other | Admitting: Physical Therapy

## 2018-05-09 ENCOUNTER — Encounter (HOSPITAL_BASED_OUTPATIENT_CLINIC_OR_DEPARTMENT_OTHER): Payer: Self-pay | Admitting: *Deleted

## 2018-05-09 ENCOUNTER — Encounter: Payer: Self-pay | Admitting: Physical Therapy

## 2018-05-09 DIAGNOSIS — M25562 Pain in left knee: Principal | ICD-10-CM

## 2018-05-09 DIAGNOSIS — Z170 Estrogen receptor positive status [ER+]: Secondary | ICD-10-CM | POA: Diagnosis not present

## 2018-05-09 DIAGNOSIS — M25662 Stiffness of left knee, not elsewhere classified: Secondary | ICD-10-CM | POA: Diagnosis not present

## 2018-05-09 DIAGNOSIS — R293 Abnormal posture: Secondary | ICD-10-CM | POA: Diagnosis not present

## 2018-05-09 DIAGNOSIS — G8929 Other chronic pain: Secondary | ICD-10-CM | POA: Diagnosis not present

## 2018-05-09 DIAGNOSIS — C50412 Malignant neoplasm of upper-outer quadrant of left female breast: Secondary | ICD-10-CM | POA: Diagnosis not present

## 2018-05-09 DIAGNOSIS — R2689 Other abnormalities of gait and mobility: Secondary | ICD-10-CM

## 2018-05-09 NOTE — Therapy (Addendum)
[facility] [address], [place], [place], [address] Phone: [phone]   Fax:  [phone]  Physical Therapy Treatment  Patient Details  Name: [name] MRN: [id number] Date of Birth: 29-Mar-1946 Referring Provider: Dr. Rolm Bookbinder   Encounter Date: 05/09/2018  PT End of Session - 05/09/18 1235    Visit Number  8    Date for PT Re-Evaluation  07/04/18    Authorization Type  medicare/tricare    PT Start Time  1230    PT Stop Time  1310    PT Time Calculation (min)  40 min    Activity Tolerance  Patient tolerated treatment well;No increased pain    Behavior During Therapy  WFL for tasks assessed/performed       Past Medical History:  Diagnosis Date  . Complication of anesthesia    aspiration pna; following a colonoscopy, pt requests head of bed elevated if possible.  . Constipation   . Cough   . Depression   . Dysrhythmia    RBBB on 06-06-17 ekg   . Elevated liver function tests   . Esophageal stricture   . Family history of breast cancer   . Gallstones   . Genetic testing 04/02/2018   STAT Breast panel with reflex to Multi-Cancer panel (83 genes) @ [facility] - No pathogenic mutations detected  . GERD (gastroesophageal reflux disease)   . Hiatal hernia   . Hyperlipemia   . Hypothyroidism   . Insomnia   . Malignant neoplasm of upper-outer quadrant of left female breast ([place])   . Nephrolithiasis   . Renal cyst     Past Surgical History:  Procedure Laterality Date  . BREAST LUMPECTOMY WITH RADIOACTIVE SEED AND SENTINEL LYMPH NODE BIOPSY Left 04/17/2018   Procedure: LEFT BREAST LUMPECTOMY WITH BRACKETED RADIOACTIVE SEEDS AND SENTINEL LYMPH NODE BIOPSY;  Surgeon: Rolm Bookbinder, MD;  Location: [place];  Service: General;  Laterality: Left;  . CARDIOVASCULAR STRESS TEST  07/19/2006   EF 70%, NO EVIDENCE OF ISCHEMIA  . CHOLECYSTECTOMY  12/30/10  . COLONOSCOPY    . DILATION AND  CURETTAGE OF UTERUS    . inguinal herniography    . NASAL SINUS SURGERY    . RADIOACTIVE SEED GUIDED EXCISIONAL BREAST BIOPSY Right 04/17/2018   Procedure: RIGHT BREAST SEED GUIDED EXCISIONAL BIOPSY;  Surgeon: Rolm Bookbinder, MD;  Location: [place];  Service: General;  Laterality: Right;  . T & A    . TOOTH EXTRACTION    . TOTAL KNEE ARTHROPLASTY Left 09/11/2017   Procedure: LEFT TOTAL KNEE ARTHROPLASTY;  Surgeon: Gaynelle Arabian, MD;  Location: [facility];  Service: Orthopedics;  Laterality: Left;  with block  . US ECHOCARDIOGRAPHY  07/17/2006   EF 55-60%    There were no vitals filed for this visit.  Subjective Assessment - 05/09/18 1232    Subjective  I have had procedures including lymphatic fluid withdrawn, biopsies, and MRI's.  My margins were not clear so I need to go back.     Pertinent History  Patient was diagnoesd on 03/08/18 with lfet grade 1-2 invasive ductal carcinoma breast cancer.It measures 1.5 cm and is located in the upper outer quadrant. It is ER/PR positive and HER2 negative with a Ki67 of < 1%. She also has a finding on her MRI of a 1.1 cm area on the right breast which will be biopsied. She had a left total knee replacement in 10/18 with Dr. Wynelle Link and continues  to have pain in her left lateral knee. Her orthopedist reported it is likely due to her iliotibial band.    Patient Stated Goals  For breast cancer - reduce lymphedema risk and learn post op shoulder ROM HEP; for left knee - have less pain with standing and walking, sit to stand, and descending stairs.    Currently in Pain?  Yes    Pain Score  10-Worst pain ever    Pain Location  Knee    Pain Orientation  Left    Pain Descriptors / Indicators  Aching;Dull;Sharp    Pain Type  Chronic pain    Pain Onset  More than a month ago    Pain Frequency  Constant    Aggravating Factors   standing, stairs, walking    Pain Relieving Factors  rest    Multiple Pain Sites  No         [facility] PT Assessment -  05/09/18 0001      Assessment   Medical Diagnosis  Left breast cancer; left knee pain    Referring Provider  Dr. Rolm Bookbinder    Onset Date/Surgical Date  03/08/18    Hand Dominance  Right    Prior Therapy  Yes for left knee post operatively      Precautions   Precautions  Other (comment)    Precaution Comments  active breast cancer; no movement of left arm      Restrictions   Weight Bearing Restrictions  No      Home Environment   Living Environment  Private residence    Living Arrangements  Spouse/significant other      Cognition   Overall Cognitive Status  Within Functional Limits for tasks assessed      Posture/Postural Control   Posture/Postural Control  Postural limitations    Postural Limitations  Rounded Shoulders;Forward head      AROM   Left Knee Extension  0    Left Knee Flexion  132      Strength   Overall Strength  Within functional limits for tasks performed      Palpation   Palpation comment  tenderness located on lateral left knee with increased scar tissue felt       Ambulation/Gait   Ambulation/Gait  Yes    Gait Pattern  Decreased step length - left;Decreased dorsiflexion - left      Standardized Balance Assessment   Five times sit to stand comments   31 sec      Timed Up and Go Test   TUG  Normal TUG    Normal TUG (seconds)  15                   [facility] Adult PT Treatment/Exercise - 05/09/18 0001      Knee/Hip Exercises: Aerobic   Nustep  L1 x 8 min PT present for status and no arms; monitor for pain      Knee/Hip Exercises: Seated   Long Arc Quad  Strengthening;Left;2 sets;15 reps    Ball Squeeze  hold 5 sec; 15 times    Hamstring Curl  Strengthening;Left;1 set;15 reps green band      Manual Therapy   Manual Therapy  Soft tissue mobilization;Joint mobilization    Joint Mobilization  left patella medial glide    Soft tissue mobilization  lateral left knee, left ITB insertion                  PT Long Term  Goals -  05/09/18 1324      PT LONG TERM GOAL #2   Title  Decrease left knee pain to </= 4/10 with walking >/= 15 minutes.    Baseline  Pain 6/10 with walking.    Time  4    Period  Weeks    Status  On-going      PT LONG TERM GOAL #3   Title  Decrease left knee pain to </= 5/10 with sit to stand.    Baseline  9/10    Time  4    Period  Weeks    Status  On-going      PT LONG TERM GOAL #4   Title  Decrease left knee pain to </= 4/10 with descending stairs.    Time  4    Period  Weeks    Status  On-going      PT LONG TERM GOAL #5   Title  Time up and go test </= 13 seconds due to improved balance and endurance    Time  8    Period  Weeks    Status  New    Target Date  07/04/18      Additional Long Term Goals   Additional Long Term Goals  Yes      PT LONG TERM GOAL #6   Title  sit to stand 5 times </= 13 seconds due to improve strength and endurance    Time  8    Period  Weeks    Status  New    Target Date  07/04/18      Breast Clinic Goals - 03/21/18 1527      Patient will be able to verbalize understanding of pertinent lymphedema risk reduction practices relevant to her diagnosis specifically related to skin care.   Time  1    Period  Days    Status  Achieved      Patient will be able to return demonstrate and/or verbalize understanding of the post-op home exercise program related to regaining shoulder range of motion.   Time  1    Period  Days    Status  Achieved      Patient will be able to verbalize understanding of the importance of attending the postoperative After Breast Cancer Class for further lymphedema risk reduction education and therapeutic exercise.   Time  1    Period  Days    Status  Achieved           Plan - 05/09/18 1318    Clinical Impression Statement  Patient has been more sedentary due to her lumpectomies and treatments since her diagnosis of bilateral breast cancer. Patient has full ROM of left knee but very painful with clicking  noise. Palpation of the lateral knee felt thick like scar tissue.  Patient has decreased mobility of left patella. Patient strength of left knee is 5/5.  Patient ambulates with decreased left heel strike and step length on left.  Timed up and go is 15 seconds indicating risk of falls.  Sit to stand is 31 seconds  indicating risk of falls due ot norm is 12.6 seconds. Patient had lumpectomy in bilateral breast and needs to return due to margins not clear.  Patient will benefi tfrom skilled therapy to improve mobility, continue to work on endurance and balance.     Rehab Potential  Excellent    Clinical Impairments Affecting Rehab Potential  None    PT Frequency  1x / week  PT Duration  8 weeks    PT Treatment/Interventions  ADLs/Self Care Home Management;Iontophoresis '4mg'$ /ml Dexamethasone;Moist Heat;Therapeutic exercise;Therapeutic activities;Patient/family education;Passive range of motion;Scar mobilization;Manual techniques;Dry needling    PT Next Visit Plan  work on balance, endurance LE strength    Recommended Other Services  MD renewal note sent on 05/09/2018    Consulted and Agree with Plan of Care  Patient       Patient will benefit from skilled therapeutic intervention in order to improve the following deficits and impairments:  Decreased knowledge of precautions, Impaired UE functional use, Decreased range of motion, Postural dysfunction, Decreased scar mobility, Decreased mobility, Pain, Increased fascial restricitons  Visit Diagnosis: Chronic pain of left knee - Plan: PT plan of care cert/re-cert  Stiffness of left knee, not elsewhere classified - Plan: PT plan of care cert/re-cert  Other abnormalities of gait and mobility - Plan: PT plan of care cert/re-cert     Problem List Patient Active Problem List   Diagnosis Date Noted  . Genetic testing 04/02/2018  . Family history of breast cancer   . Malignant neoplasm of upper-outer quadrant of left female breast ([place])   . Malignant  neoplasm of upper-outer quadrant of left breast in female, estrogen receptor positive ([place]) 03/16/2018  . OA (osteoarthritis) of knee 09/11/2017  . Infrapatellar bursitis of right knee 04/29/2014  . Pneumonia, aspiration ([place]) 12/28/2012  . HYPOTHYROIDISM 11/15/2010  . HYPERLIPIDEMIA 11/15/2010  . DEPRESSION 11/15/2010  . GERD 11/15/2010  . CONSTIPATION 11/15/2010  . GALLSTONES 11/15/2010  . RENAL CYST 11/15/2010  . INSOMNIA UNSPECIFIED 11/15/2010  . COUGH 11/15/2010  . ABDOMINAL PAIN OTHER SPECIFIED SITE 11/15/2010  . TRANSAMINASES, SERUM, ELEVATED 11/15/2010    Earlie Counts, PT 05/09/18 1:28 PM   [facility] [address], [place], [place], [address] Phone: [phone]   Fax:  [phone]  Name: [name] MRN: [id number] Date of Birth: 02/05/1946  PHYSICAL THERAPY DISCHARGE SUMMARY  Visits from Start of Care: 8  Current functional level related to goals / functional outcomes: See above. Patient stopped coming to therapy due to having further treatments for her breast cancer.    Remaining deficits: See above.    Education / Equipment: HEP Plan:                                                    Patient goals were not met. Patient is being discharged due to a change in medical status. Patient had to have further treatment for her breast cancer. Thank you for the referral. Earlie Counts, PT 10/18/18 3:10 PM   ?????

## 2018-05-10 ENCOUNTER — Telehealth: Payer: Self-pay | Admitting: *Deleted

## 2018-05-10 DIAGNOSIS — C50412 Malignant neoplasm of upper-outer quadrant of left female breast: Secondary | ICD-10-CM

## 2018-05-10 DIAGNOSIS — Z170 Estrogen receptor positive status [ER+]: Principal | ICD-10-CM

## 2018-05-10 NOTE — Telephone Encounter (Signed)
Received oncotype score of 14/4%. Physician team notified. Called pt with results. Discussed chemotherapy is not recommended. Next step is xrt with Dr. Isidore Moos.

## 2018-05-11 ENCOUNTER — Encounter: Payer: Self-pay | Admitting: Radiation Oncology

## 2018-05-11 NOTE — Progress Notes (Signed)
Ensure pre surgery drink given with instructions to complete by 0430 dos, pt verbalized understanding. 

## 2018-05-14 ENCOUNTER — Encounter: Payer: Self-pay | Admitting: Obstetrics and Gynecology

## 2018-05-14 NOTE — H&P (Signed)
72 year old G 0 with endometrial polyp measuring 1.2 cm noted on recent pelvic ultrasound.  She is currently undergoing treatment for breast cancer.  Past Medical History:  Diagnosis Date  . Complication of anesthesia    aspiration pna; following a colonoscopy, pt requests head of bed elevated if possible.  . Constipation   . Cough   . Depression   . Dysrhythmia    RBBB on 06-06-17 ekg   . Elevated liver function tests   . Esophageal stricture   . Family history of breast cancer   . Gallstones   . Genetic testing 04/02/2018   STAT Breast panel with reflex to Multi-Cancer panel (83 genes) @ [facility] - No pathogenic mutations detected  . GERD (gastroesophageal reflux disease)   . Hiatal hernia   . Hyperlipemia   . Hypothyroidism   . Insomnia   . Malignant neoplasm of upper-outer quadrant of left female breast ([place])   . Nephrolithiasis   . Renal cyst    Past Surgical History:  Procedure Laterality Date  . BREAST LUMPECTOMY WITH RADIOACTIVE SEED AND SENTINEL LYMPH NODE BIOPSY Left 04/17/2018   Procedure: LEFT BREAST LUMPECTOMY WITH BRACKETED RADIOACTIVE SEEDS AND SENTINEL LYMPH NODE BIOPSY;  Surgeon: Rolm Bookbinder, MD;  Location: [place];  Service: General;  Laterality: Left;  . CARDIOVASCULAR STRESS TEST  07/19/2006   EF 70%, NO EVIDENCE OF ISCHEMIA  . CHOLECYSTECTOMY  12/30/10  . COLONOSCOPY    . DILATION AND CURETTAGE OF UTERUS    . inguinal herniography    . NASAL SINUS SURGERY    . RADIOACTIVE SEED GUIDED EXCISIONAL BREAST BIOPSY Right 04/17/2018   Procedure: RIGHT BREAST SEED GUIDED EXCISIONAL BIOPSY;  Surgeon: Rolm Bookbinder, MD;  Location: [place];  Service: General;  Laterality: Right;  . T & A    . TOOTH EXTRACTION    . TOTAL KNEE ARTHROPLASTY Left 09/11/2017   Procedure: LEFT TOTAL KNEE ARTHROPLASTY;  Surgeon: Gaynelle Arabian, MD;  [facility];  Service: Orthopedics;  Laterality: Left;  with block  . US ECHOCARDIOGRAPHY   07/17/2006   EF 55-60%   Prior to Admission medications   Medication Sig Start Date End Date Taking? Authorizing Provider  azelastine (ASTELIN) 0.1 % nasal spray Place 1 spray into both nostrils 2 (two) times daily. Use in each nostril as directed    [provider]  Cholecalciferol (VITAMIN D3) 1000 units CAPS Take 1 capsule by mouth daily.    [provider]  escitalopram (LEXAPRO) 10 MG tablet Take 15 mg by mouth daily.     [provider]  esomeprazole (NEXIUM) 40 MG capsule Take 40 mg by mouth 2 (two) times daily before a meal.    [provider]  ezetimibe (ZETIA) 10 MG tablet Take 10 mg by mouth at bedtime.    [provider]  fluticasone (FLONASE) 50 MCG/ACT nasal spray Place 1 spray into both nostrils daily.    [provider]  levothyroxine (SYNTHROID, LEVOTHROID) 50 MCG tablet Take 50 mcg by mouth at bedtime.    [provider]  Magnesium 250 MG TABS Take 1 tablet by mouth daily.     [provider]  Probiotic Product (PROBIOTIC ADVANCED PO) Take 1 tablet by mouth daily.    [provider]  rosuvastatin (CRESTOR) 5 MG tablet Take 5 mg by mouth at bedtime.     [provider]  sodium chloride (OCEAN) 0.65 % SOLN nasal spray Place 4 sprays into  both nostrils 4 (four) times daily.    [provider]  SUPER B COMPLEX/C PO Take 1 tablet by mouth daily.    [provider]  traMADol (ULTRAM) 50 MG tablet Take 2 tablets (100 mg total) by mouth every 6 (six) hours as needed. 04/17/18   Rolm Bookbinder, MD   Oxycontin [oxycodone hcl] and Codeine  Family History  Problem Relation Age of Onset  . Breast cancer Mother 27       deceased 45; TAH/BSO in 72s/50s  . Dementia Father        deceased 38  . Breast cancer Maternal Aunt 82       deceased 5s  . Colon cancer Neg Hx   . Stomach cancer Neg Hx    Social History   Socioeconomic History  . Marital status: Married    Spouse  name: Not on file  . Number of children: 2  . Years of education: Not on file  . Highest education level: Not on file  Occupational History  . Occupation: Retired    Fish farm manager: [employer]  Social Needs  . Financial resource strain: Not on file  . Food insecurity:    Worry: Not on file    Inability: Not on file  . Transportation needs:    Medical: Not on file    Non-medical: Not on file  Tobacco Use  . Smoking status: Never Smoker  . Smokeless tobacco: Never Used  Substance and Sexual Activity  . Alcohol use: No  . Drug use: No  . Sexual activity: Not on file  Lifestyle  . Physical activity:    Days per week: Not on file    Minutes per session: Not on file  . Stress: Not on file  Relationships  . Social connections:    Talks on phone: Not on file    Gets together: Not on file    Attends religious service: Not on file    Active member of club or organization: Not on file    Attends meetings of clubs or organizations: Not on file    Relationship status: Not on file  Other Topics Concern  . Not on file  Social History Narrative  . Not on file   There were no vitals taken for this visit. General alert and oriented Lung CTAB Car RRR Abdomen is soft and non tender Pelvic unremarkable  IMPRESSION: Endometrial Polyp  PLAN: D and C Hysteroscopy Risks reviewed

## 2018-05-15 ENCOUNTER — Encounter (HOSPITAL_BASED_OUTPATIENT_CLINIC_OR_DEPARTMENT_OTHER)
Admission: RE | Disposition: A | Discharge status: Home / Self Care | Payer: Self-pay | Source: Ambulatory Visit | Attending: General Surgery

## 2018-05-15 ENCOUNTER — Other Ambulatory Visit: Payer: Self-pay

## 2018-05-15 ENCOUNTER — Ambulatory Visit (HOSPITAL_BASED_OUTPATIENT_CLINIC_OR_DEPARTMENT_OTHER)
Admission: RE | Admit: 2018-05-15 | Discharge: 2018-05-15 | Disposition: A | Discharge status: Home / Self Care | Payer: Medicare Other | Source: Ambulatory Visit | Attending: General Surgery | Admitting: General Surgery

## 2018-05-15 ENCOUNTER — Encounter (HOSPITAL_BASED_OUTPATIENT_CLINIC_OR_DEPARTMENT_OTHER): Payer: Self-pay

## 2018-05-15 ENCOUNTER — Ambulatory Visit (HOSPITAL_BASED_OUTPATIENT_CLINIC_OR_DEPARTMENT_OTHER): Payer: Medicare Other | Admitting: Anesthesiology

## 2018-05-15 DIAGNOSIS — Z96652 Presence of left artificial knee joint: Secondary | ICD-10-CM | POA: Insufficient documentation

## 2018-05-15 DIAGNOSIS — C50412 Malignant neoplasm of upper-outer quadrant of left female breast: Secondary | ICD-10-CM | POA: Insufficient documentation

## 2018-05-15 DIAGNOSIS — Y838 Other surgical procedures as the cause of abnormal reaction of the patient, or of later complication, without mention of misadventure at the time of the procedure: Secondary | ICD-10-CM | POA: Diagnosis not present

## 2018-05-15 DIAGNOSIS — M96843 Postprocedural seroma of a musculoskeletal structure following other procedure: Secondary | ICD-10-CM | POA: Diagnosis not present

## 2018-05-15 DIAGNOSIS — Z85820 Personal history of malignant melanoma of skin: Secondary | ICD-10-CM | POA: Diagnosis not present

## 2018-05-15 DIAGNOSIS — N6022 Fibroadenosis of left breast: Secondary | ICD-10-CM | POA: Diagnosis not present

## 2018-05-15 DIAGNOSIS — L7634 Postprocedural seroma of skin and subcutaneous tissue following other procedure: Secondary | ICD-10-CM | POA: Diagnosis not present

## 2018-05-15 DIAGNOSIS — C50912 Malignant neoplasm of unspecified site of left female breast: Secondary | ICD-10-CM | POA: Diagnosis not present

## 2018-05-15 HISTORY — PX: RE-EXCISION OF BREAST LUMPECTOMY: SHX6048

## 2018-05-15 SURGERY — EXCISION, LESION, BREAST
Anesthesia: General | Site: Breast | Laterality: Left

## 2018-05-15 MED ORDER — MIDAZOLAM HCL 2 MG/2ML IJ SOLN
INTRAMUSCULAR | Status: AC
  Filled 2018-05-15: qty 2

## 2018-05-15 MED ORDER — PROPOFOL 10 MG/ML IV BOLUS
INTRAVENOUS | Status: AC
  Filled 2018-05-15: qty 20

## 2018-05-15 MED ORDER — TRAMADOL HCL 50 MG PO TABS: 100.0000 mg | ORAL_TABLET | Freq: Four times a day (QID) | ORAL | 0 refills | Status: DC | PRN

## 2018-05-15 MED ORDER — PHENYLEPHRINE 40 MCG/ML (10ML) SYRINGE FOR IV PUSH (FOR BLOOD PRESSURE SUPPORT)
PREFILLED_SYRINGE | INTRAVENOUS | Status: AC
  Filled 2018-05-15: qty 10

## 2018-05-15 MED ORDER — DEXAMETHASONE SODIUM PHOSPHATE 10 MG/ML IJ SOLN
INTRAMUSCULAR | Status: DC | PRN
  Administered 2018-05-15: 5 mg via INTRAVENOUS

## 2018-05-15 MED ORDER — BUPIVACAINE-EPINEPHRINE (PF) 0.5% -1:200000 IJ SOLN
INTRAMUSCULAR | Status: AC
  Filled 2018-05-15: qty 30

## 2018-05-15 MED ORDER — BUPIVACAINE HCL (PF) 0.25 % IJ SOLN
INTRAMUSCULAR | Status: DC | PRN
  Administered 2018-05-15: 20 mL

## 2018-05-15 MED ORDER — LACTATED RINGERS IV SOLN
INTRAVENOUS | Status: DC
  Administered 2018-05-15: 07:00:00 via INTRAVENOUS

## 2018-05-15 MED ORDER — SCOPOLAMINE 1 MG/3DAYS TD PT72: 1.0000 | MEDICATED_PATCH | Freq: Once | TRANSDERMAL | Status: DC | PRN

## 2018-05-15 MED ORDER — FENTANYL CITRATE (PF) 100 MCG/2ML IJ SOLN
INTRAMUSCULAR | Status: AC
  Filled 2018-05-15: qty 2

## 2018-05-15 MED ORDER — MIDAZOLAM HCL 5 MG/5ML IJ SOLN
INTRAMUSCULAR | Status: DC | PRN
  Administered 2018-05-15: 1 mg via INTRAVENOUS

## 2018-05-15 MED ORDER — TRAMADOL HCL 50 MG PO TABS
100.0000 mg | ORAL_TABLET | Freq: Once | ORAL | Status: AC
  Administered 2018-05-15: 100 mg via ORAL

## 2018-05-15 MED ORDER — LIDOCAINE HCL (CARDIAC) PF 100 MG/5ML IV SOSY
PREFILLED_SYRINGE | INTRAVENOUS | Status: DC | PRN
  Administered 2018-05-15: 100 mg via INTRAVENOUS

## 2018-05-15 MED ORDER — PROPOFOL 10 MG/ML IV BOLUS
INTRAVENOUS | Status: DC | PRN
  Administered 2018-05-15: 120 mg via INTRAVENOUS

## 2018-05-15 MED ORDER — ONDANSETRON HCL 4 MG/2ML IJ SOLN
INTRAMUSCULAR | Status: AC
  Filled 2018-05-15: qty 2

## 2018-05-15 MED ORDER — FENTANYL CITRATE (PF) 100 MCG/2ML IJ SOLN
25.0000 ug | INTRAMUSCULAR | Status: DC | PRN
  Administered 2018-05-15: 25 ug via INTRAVENOUS

## 2018-05-15 MED ORDER — ONDANSETRON HCL 4 MG/2ML IJ SOLN
INTRAMUSCULAR | Status: DC | PRN
  Administered 2018-05-15: 4 mg via INTRAVENOUS

## 2018-05-15 MED ORDER — MIDAZOLAM HCL 2 MG/2ML IJ SOLN: 1.0000 mg | INTRAMUSCULAR | Status: DC | PRN

## 2018-05-15 MED ORDER — HEPARIN (PORCINE) IN NACL 1000-0.9 UT/500ML-% IV SOLN
INTRAVENOUS | Status: AC
  Filled 2018-05-15: qty 500

## 2018-05-15 MED ORDER — GABAPENTIN 100 MG PO CAPS
ORAL_CAPSULE | ORAL | Status: AC
  Filled 2018-05-15: qty 1

## 2018-05-15 MED ORDER — FENTANYL CITRATE (PF) 100 MCG/2ML IJ SOLN
INTRAMUSCULAR | Status: DC | PRN
  Administered 2018-05-15: 50 ug via INTRAVENOUS

## 2018-05-15 MED ORDER — GABAPENTIN 100 MG PO CAPS
100.0000 mg | ORAL_CAPSULE | ORAL | Status: AC
  Administered 2018-05-15: 100 mg via ORAL

## 2018-05-15 MED ORDER — MEPERIDINE HCL 25 MG/ML IJ SOLN: 6.2500 mg | INTRAMUSCULAR | Status: DC | PRN

## 2018-05-15 MED ORDER — CEFAZOLIN SODIUM-DEXTROSE 2-4 GM/100ML-% IV SOLN
INTRAVENOUS | Status: AC
  Filled 2018-05-15: qty 100

## 2018-05-15 MED ORDER — PROMETHAZINE HCL 25 MG/ML IJ SOLN: 6.2500 mg | INTRAMUSCULAR | Status: DC | PRN

## 2018-05-15 MED ORDER — LIDOCAINE HCL (CARDIAC) PF 100 MG/5ML IV SOSY
PREFILLED_SYRINGE | INTRAVENOUS | Status: AC
  Filled 2018-05-15: qty 5

## 2018-05-15 MED ORDER — ACETAMINOPHEN 500 MG PO TABS
1000.0000 mg | ORAL_TABLET | ORAL | Status: AC
  Administered 2018-05-15: 1000 mg via ORAL

## 2018-05-15 MED ORDER — DEXAMETHASONE SODIUM PHOSPHATE 10 MG/ML IJ SOLN
INTRAMUSCULAR | Status: AC
  Filled 2018-05-15: qty 1

## 2018-05-15 MED ORDER — BUPIVACAINE HCL (PF) 0.25 % IJ SOLN
INTRAMUSCULAR | Status: AC
  Filled 2018-05-15: qty 120

## 2018-05-15 MED ORDER — FENTANYL CITRATE (PF) 100 MCG/2ML IJ SOLN: 50.0000 ug | INTRAMUSCULAR | Status: DC | PRN

## 2018-05-15 MED ORDER — PHENYLEPHRINE HCL 10 MG/ML IJ SOLN
INTRAMUSCULAR | Status: DC | PRN
  Administered 2018-05-15: 100 ug via INTRAVENOUS
  Administered 2018-05-15: 80 ug via INTRAVENOUS
  Administered 2018-05-15 (×2): 100 ug via INTRAVENOUS

## 2018-05-15 MED ORDER — TRAMADOL HCL 50 MG PO TABS
ORAL_TABLET | ORAL | Status: AC
  Filled 2018-05-15: qty 2

## 2018-05-15 MED ORDER — HEPARIN SOD (PORK) LOCK FLUSH 100 UNIT/ML IV SOLN
INTRAVENOUS | Status: AC
  Filled 2018-05-15: qty 5

## 2018-05-15 MED ORDER — CEFAZOLIN SODIUM-DEXTROSE 2-4 GM/100ML-% IV SOLN
2.0000 g | INTRAVENOUS | Status: AC
  Administered 2018-05-15: 2 g via INTRAVENOUS

## 2018-05-15 MED ORDER — ACETAMINOPHEN 500 MG PO TABS
ORAL_TABLET | ORAL | Status: AC
  Filled 2018-05-15: qty 2

## 2018-05-15 MED ORDER — ENSURE PRE-SURGERY PO LIQD: 296.0000 mL | Freq: Once | ORAL | Status: DC

## 2018-05-15 SURGICAL SUPPLY — 54 items
ADH SKN CLS APL DERMABOND .7 (GAUZE/BANDAGES/DRESSINGS) ×1
BINDER BREAST LRG (GAUZE/BANDAGES/DRESSINGS) ×2 IMPLANT
BINDER BREAST XLRG (GAUZE/BANDAGES/DRESSINGS) ×3 IMPLANT
BLADE SURG 15 STRL LF DISP TIS (BLADE) ×1 IMPLANT
BLADE SURG 15 STRL SS (BLADE) ×3
CANISTER SUCT 1200ML W/VALVE (MISCELLANEOUS) ×3 IMPLANT
CHLORAPREP W/TINT 26ML (MISCELLANEOUS) ×3 IMPLANT
CLIP VESOCCLUDE SM WIDE 6/CT (CLIP) ×2 IMPLANT
CLOSURE WOUND 1/2 X4 (GAUZE/BANDAGES/DRESSINGS)
COVER BACK TABLE 60X90IN (DRAPES) ×3 IMPLANT
COVER MAYO STAND STRL (DRAPES) ×3 IMPLANT
DECANTER SPIKE VIAL GLASS SM (MISCELLANEOUS) IMPLANT
DERMABOND ADVANCED (GAUZE/BANDAGES/DRESSINGS) ×2
DERMABOND ADVANCED .7 DNX12 (GAUZE/BANDAGES/DRESSINGS) IMPLANT
DRAPE LAPAROSCOPIC ABDOMINAL (DRAPES) ×3 IMPLANT
DRAPE UTILITY XL STRL (DRAPES) ×3 IMPLANT
DRSG TEGADERM 4X4.75 (GAUZE/BANDAGES/DRESSINGS) ×3 IMPLANT
ELECT COATED BLADE 2.86 ST (ELECTRODE) ×3 IMPLANT
ELECT REM PT RETURN 9FT ADLT (ELECTROSURGICAL) ×3
ELECTRODE REM PT RTRN 9FT ADLT (ELECTROSURGICAL) ×1 IMPLANT
GAUZE SPONGE 4X4 12PLY STRL LF (GAUZE/BANDAGES/DRESSINGS) ×3 IMPLANT
GLOVE BIO SURGEON STRL SZ7 (GLOVE) ×3 IMPLANT
GLOVE BIOGEL PI IND STRL 7.5 (GLOVE) ×1 IMPLANT
GLOVE BIOGEL PI INDICATOR 7.5 (GLOVE) ×2
GOWN STRL REUS W/ TWL LRG LVL3 (GOWN DISPOSABLE) ×2 IMPLANT
GOWN STRL REUS W/TWL LRG LVL3 (GOWN DISPOSABLE) ×6
HEMOSTAT ARISTA ABSORB 3G PWDR (MISCELLANEOUS) IMPLANT
ILLUMINATOR WAVEGUIDE N/F (MISCELLANEOUS) IMPLANT
KIT MARKER MARGIN INK (KITS) IMPLANT
LIGHT WAVEGUIDE WIDE FLAT (MISCELLANEOUS) IMPLANT
NDL HYPO 25X1 1.5 SAFETY (NEEDLE) ×1 IMPLANT
NEEDLE HYPO 25X1 1.5 SAFETY (NEEDLE) ×3 IMPLANT
NS IRRIG 1000ML POUR BTL (IV SOLUTION) IMPLANT
PACK BASIN DAY SURGERY FS (CUSTOM PROCEDURE TRAY) ×3 IMPLANT
PENCIL BUTTON HOLSTER BLD 10FT (ELECTRODE) ×3 IMPLANT
SLEEVE SCD COMPRESS KNEE MED (MISCELLANEOUS) ×3 IMPLANT
SPONGE LAP 4X18 RFD (DISPOSABLE) ×3 IMPLANT
STRIP CLOSURE SKIN 1/2X4 (GAUZE/BANDAGES/DRESSINGS) IMPLANT
SUT MNCRL AB 3-0 PS2 18 (SUTURE) IMPLANT
SUT MNCRL AB 4-0 PS2 18 (SUTURE) ×2 IMPLANT
SUT MON AB 5-0 PS2 18 (SUTURE) IMPLANT
SUT SILK 2 0 SH (SUTURE) ×2 IMPLANT
SUT VIC AB 2-0 SH 27 (SUTURE) ×6
SUT VIC AB 2-0 SH 27XBRD (SUTURE) ×1 IMPLANT
SUT VIC AB 3-0 SH 27 (SUTURE) ×3
SUT VIC AB 3-0 SH 27X BRD (SUTURE) ×1 IMPLANT
SUT VIC AB 5-0 PS2 18 (SUTURE) IMPLANT
SUT VICRYL AB 3 0 TIES (SUTURE) IMPLANT
SYR CONTROL 10ML LL (SYRINGE) ×3 IMPLANT
TOWEL GREEN STERILE FF (TOWEL DISPOSABLE) ×3 IMPLANT
TOWEL OR NON WOVEN STRL DISP B (DISPOSABLE) ×3 IMPLANT
TUBE CONNECTING 20'X1/4 (TUBING) ×1
TUBE CONNECTING 20X1/4 (TUBING) ×2 IMPLANT
YANKAUER SUCT BULB TIP NO VENT (SUCTIONS) ×3 IMPLANT

## 2018-05-15 NOTE — Transfer of Care (Signed)
Immediate Anesthesia Transfer of Care Note  Patient: [name]  Procedure(s) Performed: RE-EXCISION OF LEFT BREAST LUMPECTOMY, ASPIRATION OF LEFT AXILLARY SEROMA (Left Breast)  Patient Location: PACU  Anesthesia Type:General  Level of Consciousness: awake, alert  and oriented  Airway & Oxygen Therapy: Patient Spontanous Breathing and Patient connected to face mask oxygen  Post-op Assessment: Report given to RN and Post -op Vital signs reviewed and stable  Post vital signs: Reviewed and stable  Last Vitals:  Vitals Value Taken Time  BP    Temp    Pulse    Resp    SpO2      Last Pain:  Vitals:   05/15/18 0715  TempSrc: Oral  PainSc: 0-No pain         Complications: No apparent anesthesia complications

## 2018-05-15 NOTE — Interval H&P Note (Signed)
History and Physical Interval Note:  05/15/2018 7:10 AM  [name]  has presented today for surgery, with the diagnosis of LEFT BREAST CANCER  The various methods of treatment have been discussed with the patient and family. After consideration of risks, benefits and other options for treatment, the patient has consented to  Procedure(s): RE-EXCISION OF LEFT BREAST LUMPECTOMY (Left) as a surgical intervention .  The patient's history has been reviewed, patient examined, no change in status, stable for surgery.  I have reviewed the patient's chart and labs.  Questions were answered to the patient's satisfaction.     Rolm Bookbinder

## 2018-05-15 NOTE — Anesthesia Preprocedure Evaluation (Signed)
Anesthesia Evaluation  Patient identified by MRN, date of birth, ID band Patient awake    Reviewed: Allergy & Precautions, H&P , NPO status , Patient's Chart, lab work & pertinent test results  Airway Mallampati: II   Neck ROM: full    Dental   Pulmonary  H/o apiration PNA s/p colonoscopy   breath sounds clear to auscultation       Cardiovascular + dysrhythmias  Rhythm:regular Rate:Normal  RBBB   Neuro/Psych PSYCHIATRIC DISORDERS Depression  Neuromuscular disease    GI/Hepatic hiatal hernia, GERD  ,  Endo/Other  Hypothyroidism   Renal/GU Renal disease     Musculoskeletal  (+) Arthritis ,   Abdominal   Peds  Hematology   Anesthesia Other Findings   Reproductive/Obstetrics                             Anesthesia Physical  Anesthesia Plan  ASA: II  Anesthesia Plan: General   Post-op Pain Management:    Induction: Intravenous  PONV Risk Score and Plan: 3 and Ondansetron, Dexamethasone, Midazolam and Treatment may vary due to age or medical condition  Airway Management Planned: LMA  Additional Equipment:   Intra-op Plan:   Post-operative Plan: Extubation in OR  Informed Consent: I have reviewed the patients History and Physical, chart, labs and discussed the procedure including the risks, benefits and alternatives for the proposed anesthesia with the patient or authorized representative who has indicated his/her understanding and acceptance.   Dental advisory given  Plan Discussed with: CRNA  Anesthesia Plan Comments:         Anesthesia Quick Evaluation

## 2018-05-15 NOTE — Anesthesia Postprocedure Evaluation (Signed)
Anesthesia Post Note  Patient: [name]  Procedure(s) Performed: RE-EXCISION OF LEFT BREAST LUMPECTOMY, ASPIRATION OF LEFT AXILLARY SEROMA (Left Breast)     Patient location during evaluation: PACU Anesthesia Type: General Level of consciousness: sedated and patient cooperative Pain management: pain level controlled Vital Signs Assessment: post-procedure vital signs reviewed and stable Respiratory status: spontaneous breathing Cardiovascular status: stable Anesthetic complications: no    Last Vitals:  Vitals:   05/15/18 0900 05/15/18 0941  BP: 139/77 (!) 146/81  Pulse: 78 76  Resp: 14   Temp:  36.6 C  SpO2: 100% 100%    Last Pain:  Vitals:   05/15/18 0941  TempSrc:   PainSc: [name]

## 2018-05-15 NOTE — Anesthesia Procedure Notes (Signed)
Procedure Name: LMA Insertion Date/Time: 05/15/2018 7:43 AM Performed by: Jonna Munro, CRNA Pre-anesthesia Checklist: Patient identified, Emergency Drugs available, Suction available, Patient being monitored and Timeout performed Patient Re-evaluated:Patient Re-evaluated prior to induction Oxygen Delivery Method: Circle system utilized Preoxygenation: Pre-oxygenation with 100% oxygen Induction Type: IV induction LMA: LMA inserted LMA Size: 4.0 Number of attempts: 1 Placement Confirmation: positive ETCO2 and breath sounds checked- equal and bilateral Tube secured with: Tape Dental Injury: Teeth and Oropharynx as per pre-operative assessment

## 2018-05-15 NOTE — Op Note (Signed)
Preoperative diagnosis: Left breast cancer with positive margins status post lumpectomy Postoperative diagnosis: Same as above, left axillary seroma Procedure: 1.  Left breast reexcision of posterior, medial, and lateral margins 2.  Aspiration of left axillary seroma Surgeon: Dr. Serita Grammes Anesthesia: General Estimated blood loss: Minimal Complications: None Drains: None Specimens: 1.  Posterior margin marked short superior, long lateral, double deep 2.  Medial margin marked short superior, long lateral, double deep 3.  Lateral margin marked short superior, long lateral, double deep Sponge needle count was correct at completion Disposition to recovery in stable condition  Indications: This is a 72 year old female who had left upper outer quadrant invasive lobular cancer that was multifocal.  She underwent lumpectomy and sentinel lymph node biopsy.  Her lymph nodes are negative.  She had several positive margins.  She also has a small axillary seroma that I have drained a couple times in the office.  I discussed draining this in the operating room as well as reexcision of her margins.  Her Oncotype was low when she does not need chemotherapy.  Procedure: After informed consent was obtained the patient was taken to the operating room.  She was given antibiotics.  SCDs were in place.  She was then placed under general anesthesia without complication.  Her breast was prepped and draped in the standard sterile surgical fashion.  A surgical timeout was then performed.  I reentered her old incision.  There was a small seroma that I evacuated and I released all the sutures that were approximated in the breast tissue.  Identified all the margins.  I then proceeded to excise the posterior margin down to the muscle.  This was marked as above and passed off the table.  I then remove the medial and lateral margins of this cavity and marked them as above as well.  I then placed a an 18-gauge needle in  the axilla and removed about 10 cc of seroma.  This was  yellow.  There was no evidence of any infection.  I then proceeded to close the breast tissue with 2-0 Vicryl suture.  The dermis was closed with 3-0 Vicryl and the skin with 4-0 Monocryl.  Glue and Steri-Strips were applied.  A binder was applied.  She tolerated this well and was transferred to recovery stable.

## 2018-05-15 NOTE — Discharge Instructions (Signed)
[place] Office Phone Number [phone]  POST OP INSTRUCTIONS  Always review your discharge instruction sheet given to you by the facility where your surgery was performed.  IF YOU HAVE DISABILITY OR FAMILY LEAVE FORMS, YOU MUST BRING THEM TO THE OFFICE FOR PROCESSING.  DO NOT GIVE THEM TO YOUR DOCTOR.  1. A prescription for pain medication may be given to you upon discharge.  Take your pain medication as prescribed, if needed.  If narcotic pain medicine is not needed, then you may take acetaminophen (Tylenol), naprosyn (Alleve) or ibuprofen (Advil) as needed. **You had 1000 MG of Tylenol at 7:26 AM 2. Take your usually prescribed medications unless otherwise directed 3. If you need a refill on your pain medication, please contact your pharmacy.  They will contact our office to request authorization.  Prescriptions will not be filled after 5pm or on week-ends. 4. You should eat very light the first 24 hours after surgery, such as soup, crackers, pudding, etc.  Resume your normal diet the day after surgery. 5. Most patients will experience some swelling and bruising in the breast.  Ice packs and a good support bra will help.  Wear the breast binder provided or a sports bra for 72 hours day and night.  After that wear a sports bra during the day until you return to the office. Swelling and bruising can take several days to resolve.  6. It is common to experience some constipation if taking pain medication after surgery.  Increasing fluid intake and taking a stool softener will usually help or prevent this problem from occurring.  A mild laxative (Milk of Magnesia or Miralax) should be taken according to package directions if there are no bowel movements after 48 hours. 7. Unless discharge instructions indicate otherwise, you may remove your bandages 48 hours after surgery and you may shower at that time.  You may have steri-strips (small skin tapes) in place directly over the  incision.  These strips should be left on the skin for 7-10 days and will come off on their own.  If your surgeon used skin glue on the incision, you may shower in 24 hours.  The glue will flake off over the next 2-3 weeks.  Any sutures or staples will be removed at the office during your follow-up visit. 8. ACTIVITIES:  You may resume regular daily activities (gradually increasing) beginning the next day.  Wearing a good support bra or sports bra minimizes pain and swelling.  You may have sexual intercourse when it is comfortable. a. You may drive when you no longer are taking prescription pain medication, you can comfortably wear a seatbelt, and you can safely maneuver your car and apply brakes. b. RETURN TO WORK:  ______________________________________________________________________________________ 9. You should see your doctor in the office for a follow-up appointment approximately two weeks after your surgery.  Your doctors nurse will typically make your follow-up appointment when she calls you with your pathology report.  Expect your pathology report 3-4 business days after your surgery.  You may call to check if you do not hear from [place] after three days. 10. OTHER INSTRUCTIONS: _______________________________________________________________________________________________ _____________________________________________________________________________________________________________________________________ _____________________________________________________________________________________________________________________________________ _____________________________________________________________________________________________________________________________________  WHEN TO CALL DR WAKEFIELD: 1. Fever over 101.0 2. Nausea and/or vomiting. 3. Extreme swelling or bruising. 4. Continued bleeding from incision. 5. Increased pain, redness, or drainage from the incision.  The clinic staff is  available to answer your questions during regular business hours.  Please dont hesitate to call and ask to speak to  one of the nurses for clinical concerns.  If you have a medical emergency, go to the nearest emergency room or call 911.  A surgeon from [facility] is always on call at the hospital.  For further questions, please visit [email or web address]   Post Anesthesia Home Care Instructions  Activity: Get plenty of rest for the remainder of the day. A responsible individual must stay with you for 24 hours following the procedure.  For the next 24 hours, DO NOT: -Drive a car -Paediatric nurse -Drink alcoholic beverages -Take any medication unless instructed by your physician -Make any legal decisions or sign important papers.  Meals: Start with liquid foods such as gelatin or soup. Progress to regular foods as tolerated. Avoid greasy, spicy, heavy foods. If nausea and/or vomiting occur, drink only clear liquids until the nausea and/or vomiting subsides. Call your physician if vomiting continues.  Special Instructions/Symptoms: Your throat may feel dry or sore from the anesthesia or the breathing tube placed in your throat during surgery. If this causes discomfort, gargle with warm salt water. The discomfort should disappear within 24 hours.  If you had a scopolamine patch placed behind your ear for the management of post- operative nausea and/or vomiting:  1. The medication in the patch is effective for 72 hours, after which it should be removed.  Wrap patch in a tissue and discard in the trash. Wash hands thoroughly with soap and water. 2. You may remove the patch earlier than 72 hours if you experience unpleasant side effects which may include dry mouth, dizziness or visual disturbances. 3. Avoid touching the patch. Wash your hands with soap and water after contact with the patch.

## 2018-05-16 ENCOUNTER — Encounter (HOSPITAL_BASED_OUTPATIENT_CLINIC_OR_DEPARTMENT_OTHER): Payer: Self-pay | Admitting: General Surgery

## 2018-05-17 ENCOUNTER — Inpatient Hospital Stay: Payer: Medicare Other | Attending: Oncology | Admitting: Adult Health

## 2018-05-18 ENCOUNTER — Encounter: Payer: Self-pay | Admitting: Physical Therapy

## 2018-05-22 NOTE — Progress Notes (Signed)
Location of Breast Cancer: Left Breast  Histology per Pathology Report:  03/08/18 Diagnosis Breast, left, needle core biopsy, mass 2 o'clock 9 cmfn - INVASIVE DUCTAL CARCINOMA, GRADE I-II. - FOCAL DUCTAL CARCINOMA IN SITU, INTERMEDIATE GRADE, SOLID TYPE.  Receptor Status: ER(100%), PR (100%), Her2-neu (NEG), Ki-(<1%)  03/22/18 Diagnosis Breast, left, needle core biopsy - INVASIVE MAMMARY CARCINOMA, GRADE 2. SEE NOTE. - PENDING E-CADHERIN STAIN FOR FURTHER CHARACTERIZATION.  Receptor Status: ER(95%), PR(90%), Her2neu (NEG), Ki-(5%)  04/02/18 Diagnosis 1. Breast, right, needle core biopsy, superior posterior - LOBULAR NEOPLASIA (ATYPICAL LOBULAR HYPERLASIA). - FIBROCYSTIC CHANGES WITH ADENOSIS AND CALCIFICATIONS. - SEE COMMENT. 2. Breast, right, needle core biopsy, inferior anterior - FIBROCYSTIC CHANGES. - THERE IS NO EVIDENCE OF MALIGNANCY.  04/17/18 Diagnosis 1. Breast, lumpectomy, Right w/seed - LOBULAR NEOPLASIA (ATYPICAL LOBULAR HYPERPLASIA). - FIBROCYSTIC CHANGE AND ADENOSIS. - BIOPSY SITE. 2. Breast, lumpectomy, Left w/seed - MULTIFOCAL INVASIVE LOBULAR CARCINOMA, GRADE 2, LARGEST SPANNING 1.5 CM. - LOBULAR NEOPLASIA (LOBULAR CARCINOMA IN SITU). - INVASIVE TUMOR IS PRESENT AT THE LATERAL MARGIN BROADLY, MEDIAL MARGIN BROADLY, AND POSTERIOR MARGIN FOCALLY. - BIOPSY SITES. - SEE ONCOLOGY TABLE. 3. Breast, excision, Left additional Inferior Margin - LOBULAR NEOPLASIA (LOBULAR CARCINOMA IN SITU). 4. Lymph node, sentinel, biopsy, Left Axillary - ONE OF ONE LYMPH NODES NEGATIVE FOR CARCINOMA (0/1). 5. Lymph node, sentinel, biopsy, Left - ONE OF ONE LYMPH NODES NEGATIVE FOR CARCINOMA (0/1). 6. Lymph node, sentinel, biopsy, Left - ONE OF ONE LYMPH NODES NEGATIVE FOR CARCINOMA (0/1). 7. Lymph node, sentinel, biopsy, Left - ONE OF ONE LYMPH NODES NEGATIVE FOR CARCINOMA (0/1).  05/15/18 Diagnosis 1. Breast, excision, Left Lateral Margin - PREVIOUS SURGICAL SITE CHANGES -  NO RESIDUAL CARCINOMA IDENTIFIED 2. Breast, excision, Left Medial Margin - RESIDUAL INVASIVE LOBULAR CARCINOMA (0.2 CM) - LOBULAR NEOPLASIA (ATYPICAL LOBULAR HYPERPLASIA) - MARGINS UNINVOLVED BY CARCINOMA - PREVIOUS SURGICAL SITE CHANGES - ADENOSIS - CALCIFICATIONS 3. Breast, excision, Left Posterior Margin - LOBULAR NEOPLASIA (ATYPICAL LOBULAR HYPERPLASIA) - PREVIOUS SURGICAL SITE CHANGES - ADENOSIS - CALCIFICATIONS - NO CARCINOMA IDENTIFIED   Did patient present with symptoms or was this found on screening mammography?: It was found on a screening mammogram with breast distortion noted at the time.   Past/Anticipated interventions by surgeon, if any: 04/17/18 Procedure: 1.  Left breast seed bracketed lumpectomy 2.  Left deep axillary sentinel lymph node biopsy 3.  Right breast seed guided excisional biopsy Surgeon: Dr. Serita Grammes  05/15/18 Procedure: 1.  Left breast reexcision of posterior, medial, and lateral margins 2.  Aspiration of left axillary seroma Surgeon: Dr. Serita Grammes  She has had multiple seromas (five times). She will see Dr. Donne Hazel on Friday June 28th.   Past/Anticipated interventions by medical oncology, if any: 03/21/18 Dr. Jana Hakim in the Breast Clinic: 1) additional biopsies pending             (a) right breast             (b) left breast (2) definitive surgery to follow (3) Oncotype DX to be obtained from the definitive surgical sample: Chemotherapy not anticipated (4) adjuvant radiation as appropriate (5) antiestrogens to follow at the completion of local treatment (6) genetics testing pending  Lymphedema issues, if any:  No.   Pain issues, if any: She reports a "stinging" pain to her left breast incision site due to her history of seromas.  SAFETY ISSUES:  Prior radiation? No  Pacemaker/ICD? No  Possible current pregnancy? No  Is the patient on methotrexate? No  Current Complaints / other details:  She is very anxious about  radiation. She would like something to help her relax during simulation and possibly radiation.    BP (!) 142/81   Pulse 87   Temp 98.2 F (36.8 C)   Resp 18   Wt 166 lb 12.8 oz (75.7 kg)   SpO2 96%   BMI 25.36 kg/m    Wt Readings from Last 3 Encounters:  06/05/18 166 lb 12.8 oz (75.7 kg)  05/28/18 167 lb 4 oz (75.9 kg)  05/15/18 166 lb (75.3 kg)      [name], Angela Police, RN 05/22/2018,2:03 PM

## 2018-05-23 ENCOUNTER — Other Ambulatory Visit (HOSPITAL_COMMUNITY): Payer: Self-pay | Admitting: *Deleted

## 2018-05-24 ENCOUNTER — Encounter: Payer: Self-pay | Admitting: Physical Therapy

## 2018-05-24 ENCOUNTER — Other Ambulatory Visit: Payer: Self-pay

## 2018-05-24 ENCOUNTER — Ambulatory Visit: Payer: Medicare Other | Attending: General Surgery | Admitting: Physical Therapy

## 2018-05-24 DIAGNOSIS — M25612 Stiffness of left shoulder, not elsewhere classified: Secondary | ICD-10-CM

## 2018-05-24 DIAGNOSIS — M79602 Pain in left arm: Secondary | ICD-10-CM | POA: Diagnosis not present

## 2018-05-24 DIAGNOSIS — R293 Abnormal posture: Secondary | ICD-10-CM | POA: Diagnosis not present

## 2018-05-24 DIAGNOSIS — R600 Localized edema: Secondary | ICD-10-CM | POA: Diagnosis not present

## 2018-05-24 DIAGNOSIS — H524 Presbyopia: Secondary | ICD-10-CM | POA: Diagnosis not present

## 2018-05-24 DIAGNOSIS — H52223 Regular astigmatism, bilateral: Secondary | ICD-10-CM | POA: Diagnosis not present

## 2018-05-24 DIAGNOSIS — C50412 Malignant neoplasm of upper-outer quadrant of left female breast: Secondary | ICD-10-CM

## 2018-05-24 DIAGNOSIS — Z170 Estrogen receptor positive status [ER+]: Secondary | ICD-10-CM

## 2018-05-24 DIAGNOSIS — H25813 Combined forms of age-related cataract, bilateral: Secondary | ICD-10-CM | POA: Diagnosis not present

## 2018-05-24 DIAGNOSIS — H43813 Vitreous degeneration, bilateral: Secondary | ICD-10-CM | POA: Diagnosis not present

## 2018-05-24 DIAGNOSIS — H5213 Myopia, bilateral: Secondary | ICD-10-CM | POA: Diagnosis not present

## 2018-05-24 NOTE — Therapy (Signed)
[place], [place], [address] Phone: [phone]   Fax:  [phone]  Physical Therapy Evaluation  Patient Details  Name: [name] MRN: [id number] Date of Birth: [birth year] Referring Provider: Dr. Rolm Bookbinder   Encounter Date: 05/24/2018  PT End of Session - 05/24/18 1211    Visit Number  9    Number of Visits  17    Date for PT Re-Evaluation  07/05/18    Authorization Type  medicare/tricare    PT Start Time  1022    PT Stop Time  1115    PT Time Calculation (min)  53 min    Activity Tolerance  Patient tolerated treatment well    Behavior During Therapy  [facility] for tasks assessed/performed       Past Medical History:  Diagnosis Date  . Complication of anesthesia    aspiration pna; following a colonoscopy, pt requests head of bed elevated if possible.  . Constipation   . Cough   . Depression   . Dysrhythmia    RBBB on 06-06-17 ekg   . Elevated liver function tests   . Esophageal stricture   . Family history of breast cancer   . Gallstones   . Genetic testing 04/02/2018   STAT Breast panel with reflex to Multi-Cancer panel (83 genes) @ [facility] - No pathogenic mutations detected  . GERD (gastroesophageal reflux disease)   . Hiatal hernia   . Hyperlipemia   . Hypothyroidism   . Insomnia   . Malignant neoplasm of upper-outer quadrant of left female breast ([place])   . Nephrolithiasis   . Renal cyst     Past Surgical History:  Procedure Laterality Date  . BREAST LUMPECTOMY WITH RADIOACTIVE SEED AND SENTINEL LYMPH NODE BIOPSY Left 04/17/2018   Procedure: LEFT BREAST LUMPECTOMY WITH BRACKETED RADIOACTIVE SEEDS AND SENTINEL LYMPH NODE BIOPSY;  Surgeon: Rolm Bookbinder, MD;  Location: [place];  Service: General;  Laterality: Left;  . CARDIOVASCULAR STRESS TEST  07/19/2006   EF 70%, NO EVIDENCE OF ISCHEMIA  . CHOLECYSTECTOMY  12/30/10  . COLONOSCOPY    . DILATION AND  CURETTAGE OF UTERUS    . inguinal herniography    . NASAL SINUS SURGERY    . RADIOACTIVE SEED GUIDED EXCISIONAL BREAST BIOPSY Right 04/17/2018   Procedure: RIGHT BREAST SEED GUIDED EXCISIONAL BIOPSY;  Surgeon: Rolm Bookbinder, MD;  Location: [place];  Service: General;  Laterality: Right;  . RE-EXCISION OF BREAST LUMPECTOMY Left 05/15/2018   Procedure: RE-EXCISION OF LEFT BREAST LUMPECTOMY, ASPIRATION OF LEFT AXILLARY SEROMA;  Surgeon: Rolm Bookbinder, MD;  Location: [place];  Service: General;  Laterality: Left;  . T & A    . TOOTH EXTRACTION    . TOTAL KNEE ARTHROPLASTY Left 09/11/2017   Procedure: LEFT TOTAL KNEE ARTHROPLASTY;  Surgeon: Gaynelle Arabian, MD;  Location: [facility];  Service: Orthopedics;  Laterality: Left;  with block  . US ECHOCARDIOGRAPHY  07/17/2006   EF 55-60%    There were no vitals filed for this visit.   Subjective Assessment - 05/24/18 1027    Subjective  Patient reports she underwent a left lumpectomy and SNLB (0/4 nodes positive) removed on 04/17/18. She had to have a re-excision and seroma evacuation on 05/15/18. She has had multiple aspirations of her seroma since that time. She does not need chemotherapy but will undergo radiation. She is scheduled for a hysteroscopy on 06/07/18. She will undergo anti-estrogen therapy.  Patient is accompained by:  Family member    Pertinent History  Patient was diagnosed on 03/08/18 with left grade 1-2 invasive ductal carcinoma breast cancer.It measured 1.5 cm and is located in the upper outer quadrant. It is ER/PR positive and HER2 negative with a Ki67 of < 1%.  She had a left total knee replacement in 10/18 with Dr. Wynelle Link and continues to have pain in her left lateral knee. Her orthopedist reported it is likely due to her iliotibial band. Patient reports she underwent a left lumpectomy and SNLB (0/4 nodes positive) removed on 04/17/18.    Patient Stated Goals  Make sure my arm is ok    Currently in  Pain?  Yes    Pain Score  5     Pain Location  Axilla    Pain Orientation  Left    Pain Descriptors / Indicators  Burning;Shooting    Pain Type  Surgical pain    Pain Onset  More than a month ago    Pain Frequency  Intermittent    Aggravating Factors   Jarring motions         [facility] PT Assessment - 05/24/18 0001      Assessment   Medical Diagnosis  s/p left lumpectomy and SLNB    Referring Provider  Dr. Rolm Bookbinder    Onset Date/Surgical Date  04/17/18    Hand Dominance  Right    Prior Therapy  Baselines and for left knee pain      Precautions   Precautions  Other (comment)    Precaution Comments  Left arm lymphedema       Restrictions   Weight Bearing Restrictions  No      Balance Screen   Has the patient fallen in the past 6 months  No    Has the patient had a decrease in activity level because of a fear of falling?   No    Is the patient reluctant to leave their home because of a fear of falling?   No      Home Film/video editor residence    Living Arrangements  Spouse/significant other    Available Help at Discharge  Family      Prior Function   Level of Independence  Independent    Vocation Requirements  re    Leisure  She has not returned to riding her bike      Cognition   Overall Cognitive Status  Within Functional Limits for tasks assessed      Observation/Other Assessments   Observations  Visible edema present around axillary and breast incision with significant tenderness present all around left breast      Posture/Postural Control   Posture/Postural Control  Postural limitations    Postural Limitations  Rounded Shoulders;Forward head      ROM / Strength   AROM / PROM / Strength  AROM;Strength      AROM   Left Shoulder Extension  47 Degrees    Left Shoulder Flexion  88 Degrees    Left Shoulder ABduction  114 Degrees    Left Shoulder Internal Rotation  67 Degrees    Left Shoulder External Rotation  86 Degrees         LYMPHEDEMA/ONCOLOGY QUESTIONNAIRE - 05/24/18 1045      Type   Cancer Type  Left breast      Surgeries   Lumpectomy Date  04/17/18    Sentinel Lymph Node Biopsy Date  04/17/18  Number Lymph Nodes Removed  4      Treatment   Active Chemotherapy Treatment  No    Past Chemotherapy Treatment  No    Active Radiation Treatment  No    Past Radiation Treatment  No    Current Hormone Treatment  No    Past Hormone Therapy  No      What other symptoms do you have   Are you Having Heaviness or Tightness  Yes    Are you having Pain  Yes    Are you having pitting edema  No    Is it Hard or Difficult finding clothes that fit  No    Do you have infections  No    Is there Decreased scar mobility  Yes    [place]emmer Sign  No      Lymphedema Assessments   Lymphedema Assessments  Upper extremities      Right Upper Extremity Lymphedema   10 cm Proximal to Olecranon Process  28.4 cm    Olecranon Process  24.8 cm    10 cm Proximal to Ulnar Styloid Process  19.8 cm    Just Proximal to Ulnar Styloid Process  13.9 cm    Across Hand at [employer]  17.5 cm    At [place] of 2nd Digit  6 cm      Left Upper Extremity Lymphedema   10 cm Proximal to Olecranon Process  30.7 cm    Olecranon Process  25.5 cm    10 cm Proximal to Ulnar Styloid Process  19.3 cm    Just Proximal to Ulnar Styloid Process  14.7 cm    Across Hand at [employer]  17.3 cm    At [place] of 2nd Digit  5.8 cm          Quick Dash - 05/24/18 0001    Open a tight or new jar  Moderate difficulty    Do heavy household chores (wash walls, wash floors)  Moderate difficulty    Carry a shopping bag or briefcase  No difficulty    Wash your back  Mild difficulty    Use a knife to cut food  No difficulty    Recreational activities in which you take some force or impact through your arm, shoulder, or hand (golf, hammering, tennis)  Mild difficulty    During the past week, to what extent has your arm, shoulder or hand problem  interfered with your normal social activities with family, friends, neighbors, or groups?  Quite a bit    During the past week, to what extent has your arm, shoulder or hand problem limited your work or other regular daily activities  Quite a bit    Arm, shoulder, or hand pain.  Severe    Tingling (pins and needles) in your arm, shoulder, or hand  Severe    Difficulty Sleeping  Severe difficulty    DASH Score  47.73 %        Objective measurements completed on examination: See above findings.              PT Education - 05/24/18 1211    Education provided  Yes    Education Details  Post op HEP and community resources to help with managing her diagnosis such as support group, FYNN, and [employer]    Person(s) Educated  Patient;Spouse    Methods  Explanation;Demonstration;Handout    Comprehension  Returned demonstration;Verbalized understanding  PT Long Term Goals - 05/24/18 1220      PT LONG TERM GOAL #1   Title  Patient will demonstrate she has returned to baseline related to left shoulder ROM and function post operatively.    Time  6    Period  Weeks    Status  On-going      PT LONG TERM GOAL #2   Title  Patient will increase left shoulder active abduction ROM to >/= 160 degrees for increased ease reaching overhead.    Baseline  114 degrees    Time  6    Period  Weeks    Status  New      PT LONG TERM GOAL #3   Title  Patient will increase left shoulder active flexion ROM to >/= 140 degrees for increased ease reaching overhead.    Baseline  88 degrees    Time  6    Period  Weeks    Status  New      PT LONG TERM GOAL #4   Title  Patient will report >/= 50% improvement in pain and tenderness in her left breast.    Time  6    Period  Weeks    Status  New      PT LONG TERM GOAL #5   Title  Patient wil verbalize understanding of lymphedema risk reduction practices.    Time  6    Period  Weeks    Status  New      Breast Clinic Goals - 03/21/18  1527      Patient will be able to verbalize understanding of pertinent lymphedema risk reduction practices relevant to her diagnosis specifically related to skin care.   Time  1    Period  Days    Status  Achieved      Patient will be able to return demonstrate and/or verbalize understanding of the post-op home exercise program related to regaining shoulder range of motion.   Time  1    Period  Days    Status  Achieved      Patient will be able to verbalize understanding of the importance of attending the postoperative After Breast Cancer Class for further lymphedema risk reduction education and therapeutic exercise.   Time  1    Period  Days    Status  Achieved            Plan - 05/24/18 1212    Clinical Impression Statement  Patient had a left lumpectomy and sentinel node biopsy on 04/17/18. Four negative nodes were removed. She then had a re-excision and seroma evacuation on 05/15/18 and has had several aspirations of her sermoa since that time. She will need a hysteroscopy procedure on 06/07/18 for another problem. Her incisions appear to be healing well but she has significant pain in those areas and very limited ROM with edema still present in her breast and axillary region. She will benefit from PT to address those deficits.     Rehab Potential  Excellent    Clinical Impairments Affecting Rehab Potential  None    PT Frequency  2x / week May need to hold PT for a week or 2 after procedure on 06/07/18    PT Treatment/Interventions  ADLs/Self Care Home Management;Therapeutic activities;Therapeutic exercise;Patient/family education;Manual techniques;Manual lymph drainage;Scar mobilization;Passive range of motion    PT Next Visit Plan  Manual lymph drainage left breast, PROM and AAROM left shoulder    PT Home Exercise Plan  post op shoulder ROM HEP    Consulted and Agree with Plan of Care  Patient;Family member/caregiver    Family Member Consulted  Husband       Patient will benefit  from skilled therapeutic intervention in order to improve the following deficits and impairments:  Decreased knowledge of precautions, Impaired UE functional use, Decreased range of motion, Postural dysfunction, Decreased scar mobility, Decreased mobility, Pain, Increased fascial restricitons, Increased edema  Visit Diagnosis: Malignant neoplasm of upper-outer quadrant of left breast in female, estrogen receptor positive ([place]) - Plan: PT plan of care cert/re-cert  Abnormal posture - Plan: PT plan of care cert/re-cert  Stiffness of left shoulder, not elsewhere classified - Plan: PT plan of care cert/re-cert  Pain in left arm - Plan: PT plan of care cert/re-cert  Localized edema - Plan: PT plan of care cert/re-cert     Problem List Patient Active Problem List   Diagnosis Date Noted  . Genetic testing 04/02/2018  . Family history of breast cancer   . Malignant neoplasm of upper-outer quadrant of left female breast ([place])   . Malignant neoplasm of upper-outer quadrant of left breast in female, estrogen receptor positive ([place]) 03/16/2018  . OA (osteoarthritis) of knee 09/11/2017  . Infrapatellar bursitis of right knee 04/29/2014  . Pneumonia, aspiration ([place]) 12/28/2012  . HYPOTHYROIDISM 11/15/2010  . HYPERLIPIDEMIA 11/15/2010  . DEPRESSION 11/15/2010  . GERD 11/15/2010  . CONSTIPATION 11/15/2010  . GALLSTONES 11/15/2010  . RENAL CYST 11/15/2010  . INSOMNIA UNSPECIFIED 11/15/2010  . COUGH 11/15/2010  . ABDOMINAL PAIN OTHER SPECIFIED SITE 11/15/2010  . TRANSAMINASES, SERUM, ELEVATED 11/15/2010    Annia Friendly, PT 05/24/18 12:24 PM  [place], [place], [address] Phone: [phone]   Fax:  [phone]  Name: [name] MRN: [id number] Date of Birth: 09/12/1946

## 2018-05-25 NOTE — Patient Instructions (Addendum)
Your procedure is scheduled on: Thursday June 07, 2018 at 7:30 am  Enter through the Main Entrance of [facility] at: 6:00 am  Pick up the phone at the desk and dial [phone].  Call this number if you have problems the morning of surgery: [phone].  Remember: Do NOT eat food or drink any liquids after: Midnight on Wednesday June 26  Take these medicines the morning of surgery with a SIP OF WATER: Lexapro, Nexium, synthroid, Flonase spray, azelastine nasal spray  STOP ALL VITAMINS, SUPPLEMENTS, HERBAL MEDICATIONS NOW  DO NOT SMOKE DAY OF SURGERY  Do NOT wear jewelry (body piercing), metal hair clips/bobby pins, make-up, or nail polish. Do NOT wear lotions, powders, or perfumes.  You may wear deoderant. Do NOT shave for 48 hours prior to surgery. Do NOT bring valuables to the hospital. Contacts, dentures, or bridgework may not be worn into surgery.  Have a responsible adult drive you home and stay with you for 24 hours after your procedure

## 2018-05-28 ENCOUNTER — Encounter (HOSPITAL_COMMUNITY)
Admission: RE | Admit: 2018-05-28 | Discharge: 2018-05-28 | Disposition: A | Discharge status: Home / Self Care | Payer: Medicare Other | Source: Ambulatory Visit | Attending: Obstetrics and Gynecology | Admitting: Obstetrics and Gynecology

## 2018-05-28 ENCOUNTER — Other Ambulatory Visit: Payer: Self-pay

## 2018-05-28 ENCOUNTER — Other Ambulatory Visit (HOSPITAL_COMMUNITY): Payer: Medicare Other

## 2018-05-28 ENCOUNTER — Encounter (HOSPITAL_COMMUNITY): Payer: Self-pay

## 2018-05-28 DIAGNOSIS — Z01812 Encounter for preprocedural laboratory examination: Secondary | ICD-10-CM | POA: Insufficient documentation

## 2018-05-28 HISTORY — DX: Unspecified osteoarthritis, unspecified site: M19.90

## 2018-05-28 HISTORY — DX: Personal history of urinary calculi: Z87.442

## 2018-05-28 HISTORY — DX: Pneumonia, unspecified organism: J18.9

## 2018-05-28 LAB — CBC
HCT: 38 % (ref 36.0–46.0)
HEMOGLOBIN: 12.3 g/dL (ref 12.0–15.0)
MCH: 27.3 pg (ref 26.0–34.0)
MCHC: 32.4 g/dL (ref 30.0–36.0)
MCV: 84.3 fL (ref 78.0–100.0)
PLATELETS: 269 10*3/uL (ref 150–400)
RBC: 4.51 MIL/uL (ref 3.87–5.11)
RDW: 14.6 % (ref 11.5–15.5)
WBC: 8.1 10*3/uL (ref 4.0–10.5)

## 2018-05-28 LAB — ABO/RH: ABO/RH(D): A POS

## 2018-05-28 LAB — TYPE AND SCREEN
ABO/RH(D): A POS
Antibody Screen: NEGATIVE

## 2018-05-28 NOTE — Pre-Procedure Instructions (Signed)
Dr. Royce Macadamia viewed and okay' d EKG. Aware of history of breast lumpectomy and BBB

## 2018-05-30 ENCOUNTER — Encounter

## 2018-05-30 DIAGNOSIS — N840 Polyp of corpus uteri: Secondary | ICD-10-CM | POA: Diagnosis not present

## 2018-05-31 ENCOUNTER — Ambulatory Visit: Payer: Medicare Other | Admitting: Rehabilitation

## 2018-05-31 ENCOUNTER — Encounter: Payer: Self-pay | Admitting: Oncology

## 2018-05-31 ENCOUNTER — Encounter: Payer: Self-pay | Admitting: Rehabilitation

## 2018-05-31 DIAGNOSIS — R293 Abnormal posture: Secondary | ICD-10-CM | POA: Diagnosis not present

## 2018-05-31 DIAGNOSIS — M79602 Pain in left arm: Secondary | ICD-10-CM

## 2018-05-31 DIAGNOSIS — Z170 Estrogen receptor positive status [ER+]: Principal | ICD-10-CM

## 2018-05-31 DIAGNOSIS — R600 Localized edema: Secondary | ICD-10-CM | POA: Diagnosis not present

## 2018-05-31 DIAGNOSIS — C50412 Malignant neoplasm of upper-outer quadrant of left female breast: Secondary | ICD-10-CM

## 2018-05-31 DIAGNOSIS — M25612 Stiffness of left shoulder, not elsewhere classified: Secondary | ICD-10-CM

## 2018-05-31 NOTE — Therapy (Signed)
[place], [place], [address] Phone: [phone]   Fax:  [phone]  Physical Therapy Treatment  Patient Details  Name: [name] MRN: [id number] Date of Birth: 1946/03/16 Referring Provider: Dr. Rolm Bookbinder   Encounter Date: 05/31/2018  PT End of Session - 05/31/18 1719    Visit Number  10    Number of Visits  17    Date for PT Re-Evaluation  07/05/18    Authorization Type  medicare/tricare    PT Start Time  1345    PT Stop Time  1430    PT Time Calculation (min)  45 min    Activity Tolerance  Patient tolerated treatment well    Behavior During Therapy  [facility] for tasks assessed/performed       Past Medical History:  Diagnosis Date  . Arthritis    hands and knees  . Complication of anesthesia    aspiration pna; following a colonoscopy, pt requests head of bed elevated if possible.  . Constipation   . Cough   . Depression   . Dysrhythmia    RBBB on 06-06-17 ekg   . Elevated liver function tests   . Esophageal stricture   . Family history of breast cancer   . Gallstones   . Genetic testing 04/02/2018   STAT Breast panel with reflex to Multi-Cancer panel (83 genes) @ [facility] - No pathogenic mutations detected  . GERD (gastroesophageal reflux disease)   . Hiatal hernia   . History of kidney stones   . Hyperlipemia   . Hypothyroidism   . Insomnia   . Malignant neoplasm of upper-outer quadrant of left female breast ([place])   . Nephrolithiasis   . Pneumonia 2015   aspirated after colonosopy  . Renal cyst     Past Surgical History:  Procedure Laterality Date  . BREAST LUMPECTOMY WITH RADIOACTIVE SEED AND SENTINEL LYMPH NODE BIOPSY Left 04/17/2018   Procedure: LEFT BREAST LUMPECTOMY WITH BRACKETED RADIOACTIVE SEEDS AND SENTINEL LYMPH NODE BIOPSY;  Surgeon: Rolm Bookbinder, MD;  Location: [place];  Service: General;  Laterality: Left;  . BREAST SURGERY Left    Lumpectomy  . CARDIOVASCULAR STRESS TEST  07/19/2006   EF 70%, NO EVIDENCE OF ISCHEMIA  . CHOLECYSTECTOMY  12/30/10  . COLONOSCOPY    . DILATION AND CURETTAGE OF UTERUS    . inguinal herniography    . NASAL SINUS SURGERY    . RADIOACTIVE SEED GUIDED EXCISIONAL BREAST BIOPSY Right 04/17/2018   Procedure: RIGHT BREAST SEED GUIDED EXCISIONAL BIOPSY;  Surgeon: Rolm Bookbinder, MD;  Location: [place];  Service: General;  Laterality: Right;  . RE-EXCISION OF BREAST LUMPECTOMY Left 05/15/2018   Procedure: RE-EXCISION OF LEFT BREAST LUMPECTOMY, ASPIRATION OF LEFT AXILLARY SEROMA;  Surgeon: Rolm Bookbinder, MD;  Location: [place];  Service: General;  Laterality: Left;  . T & A    . TOOTH EXTRACTION    . TOTAL KNEE ARTHROPLASTY Left 09/11/2017   Procedure: LEFT TOTAL KNEE ARTHROPLASTY;  Surgeon: Gaynelle Arabian, MD;  Location: [facility];  Service: Orthopedics;  Laterality: Left;  with block  . US ECHOCARDIOGRAPHY  07/17/2006   EF 55-60%    There were no vitals filed for this visit.  Subjective Assessment - 05/31/18 1431    Subjective  Just having pain around the new excision site.  Not sure if it is the lymph nodes or the seroma coming back.   "I tried my post  op exercises but they hurt the breast and axilla so I stopped"     Pertinent History  Patient was diagnosed on 03/08/18 with left grade 1-2 invasive ductal carcinoma breast cancer.It measured 1.5 cm and is located in the upper outer quadrant. It is ER/PR positive and HER2 negative with a Ki67 of < 1%.  She had a left total knee replacement in 10/18 with Dr. Wynelle Link and continues to have pain in her left lateral knee. Her orthopedist reported it is likely due to her iliotibial band. Patient reports she underwent a left lumpectomy and SNLB (0/4 nodes positive) removed on 04/17/18.    Currently in Pain?  Yes    Pain Score  4     Pain Location  Breast    Pain Orientation  Left    Pain Descriptors / Indicators   Burning;Aching    Pain Type  Surgical pain    Pain Radiating Towards  from incision to armpit Lt    Pain Onset  More than a month ago    Pain Frequency  Intermittent                       [facility] Adult PT Treatment/Exercise - 05/31/18 0001      Manual Therapy   Manual Therapy  Manual Lymphatic Drainage (MLD);Passive ROM    Soft tissue mobilization  gentle pressure to Ltpectoralis belly    Manual Lymphatic Drainage (MLD)  short session with education on anatomy, purpose, and technique using diagrams to assist, performed short neck, deep abdominal with pt permission, L axillary and Rt inguinal nodes, Interaxillary pathway and Lt axillo inguinal pathway then stationary circles superior and lateral breast tissue    Passive ROM  Lt shoulder to tolerance with focus on flexion and abduction             PT Education - 05/31/18 1719    Education provided  Yes    Education Details  anatomy, reason for MLD,     Person(s) Educated  Patient    Methods  Explanation    Comprehension  Verbalized understanding          PT Long Term Goals - 05/24/18 1220      PT LONG TERM GOAL #1   Title  Patient will demonstrate she has returned to baseline related to left shoulder ROM and function post operatively.    Time  6    Period  Weeks    Status  On-going      PT LONG TERM GOAL #2   Title  Patient will increase left shoulder active abduction ROM to >/= 160 degrees for increased ease reaching overhead.    Baseline  114 degrees    Time  6    Period  Weeks    Status  New      PT LONG TERM GOAL #3   Title  Patient will increase left shoulder active flexion ROM to >/= 140 degrees for increased ease reaching overhead.    Baseline  88 degrees    Time  6    Period  Weeks    Status  New      PT LONG TERM GOAL #4   Title  Patient will report >/= 50% improvement in pain and tenderness in her left breast.    Time  6    Period  Weeks    Status  New      PT LONG TERM GOAL #5    Title  Patient wil verbalize understanding of lymphedema risk reduction practices.    Time  6    Period  Weeks    Status  New      Breast Clinic Goals - 03/21/18 1527      Patient will be able to verbalize understanding of pertinent lymphedema risk reduction practices relevant to her diagnosis specifically related to skin care.   Time  1    Period  Days    Status  Achieved      Patient will be able to return demonstrate and/or verbalize understanding of the post-op home exercise program related to regaining shoulder range of motion.   Time  1    Period  Days    Status  Achieved      Patient will be able to verbalize understanding of the importance of attending the postoperative After Breast Cancer Class for further lymphedema risk reduction education and therapeutic exercise.   Time  1    Period  Days    Status  Achieved           Plan - 05/31/18 1720    Clinical Impression Statement  Pt with tenderness and slight spasm to the Lt pectoralis especiallyinto flexion and abduction.  Tenderness also over the axillary node incision.  No seroma palpable.  Pt does have compression bras at this time and wears them off and on.  Breast not significantly swollen today more lateral chest/axilla also with tenderness here. Pt does reports she wants to get a compression sleeve and gauntlet so sent presciption off today.      Rehab Potential  Excellent    PT Frequency  2x / week    PT Duration  8 weeks    PT Treatment/Interventions  ADLs/Self Care Home Management;Therapeutic activities;Therapeutic exercise;Patient/family education;Manual techniques;Manual lymph drainage;Scar mobilization;Passive range of motion    PT Next Visit Plan  continue Manual lymph drainage left breast, PROM and AAROM left shoulder, start self MLD as able, script back for compression/discuss options    PT Home Exercise Plan  post op shoulder ROM HEP       Patient will benefit from skilled therapeutic intervention in  order to improve the following deficits and impairments:  Decreased knowledge of precautions, Impaired UE functional use, Decreased range of motion, Postural dysfunction, Decreased scar mobility, Decreased mobility, Pain, Increased fascial restricitons, Increased edema  Visit Diagnosis: Malignant neoplasm of upper-outer quadrant of left breast in female, estrogen receptor positive ([facility])  Abnormal posture  Stiffness of left shoulder, not elsewhere classified  Pain in left arm  Localized edema     Problem List Patient Active Problem List   Diagnosis Date Noted  . Genetic testing 04/02/2018  . Family history of breast cancer   . Malignant neoplasm of upper-outer quadrant of left female breast ([place])   . Malignant neoplasm of upper-outer quadrant of left breast in female, estrogen receptor positive ([place]) 03/16/2018  . OA (osteoarthritis) of knee 09/11/2017  . Infrapatellar bursitis of right knee 04/29/2014  . Pneumonia, aspiration ([place]) 12/28/2012  . HYPOTHYROIDISM 11/15/2010  . HYPERLIPIDEMIA 11/15/2010  . DEPRESSION 11/15/2010  . GERD 11/15/2010  . CONSTIPATION 11/15/2010  . GALLSTONES 11/15/2010  . RENAL CYST 11/15/2010  . INSOMNIA UNSPECIFIED 11/15/2010  . COUGH 11/15/2010  . ABDOMINAL PAIN OTHER SPECIFIED SITE 11/15/2010  . TRANSAMINASES, SERUM, ELEVATED 11/15/2010    Stark Bray 05/31/2018, 5:24 PM  [place], [place], [address] Phone: [phone]   Fax:  [phone]  Name: [name] MRN: [id number] Date of Birth: Feb 22, 1946

## 2018-06-01 ENCOUNTER — Telehealth: Payer: Self-pay | Admitting: *Deleted

## 2018-06-01 NOTE — Telephone Encounter (Signed)
Spoke to pt and informed that she does not need to see Dr. Jana Hakim until after xrt. Received verbal understanding.

## 2018-06-04 ENCOUNTER — Ambulatory Visit: Payer: Medicare Other | Admitting: Physical Therapy

## 2018-06-04 DIAGNOSIS — R293 Abnormal posture: Secondary | ICD-10-CM | POA: Diagnosis not present

## 2018-06-04 DIAGNOSIS — M79602 Pain in left arm: Secondary | ICD-10-CM | POA: Diagnosis not present

## 2018-06-04 DIAGNOSIS — M25612 Stiffness of left shoulder, not elsewhere classified: Secondary | ICD-10-CM

## 2018-06-04 DIAGNOSIS — R600 Localized edema: Secondary | ICD-10-CM | POA: Diagnosis not present

## 2018-06-04 DIAGNOSIS — Z170 Estrogen receptor positive status [ER+]: Secondary | ICD-10-CM | POA: Diagnosis not present

## 2018-06-04 DIAGNOSIS — C50412 Malignant neoplasm of upper-outer quadrant of left female breast: Secondary | ICD-10-CM | POA: Diagnosis not present

## 2018-06-04 NOTE — Patient Instructions (Signed)
Self manual lymph drainage: Perform this sequence once a day.  Only give enough pressure no your skin to make the skin move.  Diaphragmatic - Supine   Inhale through nose making navel move out toward hands. Exhale through puckered lips, hands follow navel in. Repeat _5__ times. Rest _10__ seconds between repeats.   Copyright  VHI. All rights reserved.  Hug yourself.  Do circles at your neck just above your collarbones.  Repeat this 10 times.  Axilla - One at a Time   Using full weight of flat hand and fingers at center of uninvolved armpit, make _10__ in-place circles.   Copyright  VHI. All rights reserved.  LEG: Inguinal Nodes Stimulation   With small finger side of hand against hip crease on involved side, gently perform circles at the crease. Repeat __10_ times.   Copyright  VHI. All rights reserved.  1) Axilla to Inguinal Nodes - Pump   On involved side, pump _4__ times from armpit along side of trunk to hip crease.  Now gently stretch skin from the involved side to the uninvolved side across the chest at the shoulder line.  Repeat that 4 times.  Draw an imaginary diagonal line from upper outer breast through the nipple area toward lower inner breast.  Direct fluid upward and inward from this line toward the pathway across your upper chest .  Do this in three rows to treat all of the upper inner breast tissue, and do each row 3-4x.      Direct fluid to treat all of lower outer breast tissue downward and outward toward      pathway that is aimed at the left groin.  Finish by doing the pathways as described above going from your involved armpit to the same side groin and going across your upper chest from the involved shoulder to the uninvolved shoulder.  Repeat the steps above where you do circles in your left groin and right armpit. Copyright  VHI. All rights reserved.

## 2018-06-04 NOTE — Therapy (Signed)
[place], [place], [address] Phone: [phone]   Fax:  [phone]  Physical Therapy Treatment  Patient Details  Name: [name] MRN: [id number] Date of Birth: November 05, 1946 Referring Provider: Dr. Rolm Bookbinder   Encounter Date: 06/04/2018  PT End of Session - 06/04/18 1436    Visit Number  11    Number of Visits  17    Date for PT Re-Evaluation  07/05/18    Authorization Type  medicare/tricare    PT Start Time  1345    PT Stop Time  1430    PT Time Calculation (min)  45 min    Activity Tolerance  Patient tolerated treatment well some tenderness at left breast    Behavior During Therapy  [facility] for tasks assessed/performed       Past Medical History:  Diagnosis Date  . Arthritis    hands and knees  . Complication of anesthesia    aspiration pna; following a colonoscopy, pt requests head of bed elevated if possible.  . Constipation   . Cough   . Depression   . Dysrhythmia    RBBB on 06-06-17 ekg   . Elevated liver function tests   . Esophageal stricture   . Family history of breast cancer   . Gallstones   . Genetic testing 04/02/2018   STAT Breast panel with reflex to Multi-Cancer panel (83 genes) @ [facility] - No pathogenic mutations detected  . GERD (gastroesophageal reflux disease)   . Hiatal hernia   . History of kidney stones   . Hyperlipemia   . Hypothyroidism   . Insomnia   . Malignant neoplasm of upper-outer quadrant of left female breast ([place])   . Nephrolithiasis   . Pneumonia 2015   aspirated after colonosopy  . Renal cyst     Past Surgical History:  Procedure Laterality Date  . BREAST LUMPECTOMY WITH RADIOACTIVE SEED AND SENTINEL LYMPH NODE BIOPSY Left 04/17/2018   Procedure: LEFT BREAST LUMPECTOMY WITH BRACKETED RADIOACTIVE SEEDS AND SENTINEL LYMPH NODE BIOPSY;  Surgeon: Rolm Bookbinder, MD;  Location: [place];  Service: General;  Laterality:  Left;  . BREAST SURGERY Left    Lumpectomy  . CARDIOVASCULAR STRESS TEST  07/19/2006   EF 70%, NO EVIDENCE OF ISCHEMIA  . CHOLECYSTECTOMY  12/30/10  . COLONOSCOPY    . DILATION AND CURETTAGE OF UTERUS    . inguinal herniography    . NASAL SINUS SURGERY    . RADIOACTIVE SEED GUIDED EXCISIONAL BREAST BIOPSY Right 04/17/2018   Procedure: RIGHT BREAST SEED GUIDED EXCISIONAL BIOPSY;  Surgeon: Rolm Bookbinder, MD;  Location: [place];  Service: General;  Laterality: Right;  . RE-EXCISION OF BREAST LUMPECTOMY Left 05/15/2018   Procedure: RE-EXCISION OF LEFT BREAST LUMPECTOMY, ASPIRATION OF LEFT AXILLARY SEROMA;  Surgeon: Rolm Bookbinder, MD;  Location: [place];  Service: General;  Laterality: Left;  . T & A    . TOOTH EXTRACTION    . TOTAL KNEE ARTHROPLASTY Left 09/11/2017   Procedure: LEFT TOTAL KNEE ARTHROPLASTY;  Surgeon: Gaynelle Arabian, MD;  [facility];  Service: Orthopedics;  Laterality: Left;  with block  . US ECHOCARDIOGRAPHY  07/17/2006   EF 55-60%    There were no vitals filed for this visit.  Subjective Assessment - 06/04/18 1347    Subjective  Things are about the same. I haven't really noticed improvement yet from the manual lymph drainage. Will see the surgeon on 06/13/18;  hasn't seen him since surgery.    Pertinent History  Patient was diagnosed on 03/08/18 with left grade 1-2 invasive ductal carcinoma breast cancer.It measured 1.5 cm and is located in the upper outer quadrant. It is ER/PR positive and HER2 negative with a Ki67 of < 1%.  She had a left total knee replacement in 10/18 with Dr. Wynelle Link and continues to have pain in her left lateral knee. Her orthopedist reported it is likely due to her iliotibial band. Patient reports she underwent a left lumpectomy and SNLB (0/4 nodes positive) removed on 04/17/18.    Patient Stated Goals  Make sure my arm is ok    Currently in Pain?  Yes    Pain Score  4     Pain Location  Breast    Pain  Orientation  Left;Lateral    Pain Descriptors / Indicators  Other (Comment) stinging    Aggravating Factors   moving the left arm too much    Pain Relieving Factors  arm at rest                       [facility] Adult PT Treatment/Exercise - 06/04/18 0001      Manual Therapy   Manual Lymphatic Drainage (MLD)  Instructed patient in and had her perform the following: diaphragmatic breathing, short neck, right axilla and anterior interaxillary anastomosis, left groin and axillo-inguinal anastomosis, and left breast as well as lymph node dissection scar, directing toward patways lymph node dissection scar is indurated    Passive ROM  In supine to left shoulder into er and radiation position, with gentle stretch to tolerance able to get to position for radiation             PT Education - 06/04/18 1426    Education provided  Yes    Education Details  self-manual lymph drainage    Person(s) Educated  Patient    Methods  Explanation;Tactile cues;Verbal cues;Handout    Comprehension  Verbalized understanding;Returned demonstration          PT Long Term Goals - 05/24/18 1220      PT LONG TERM GOAL #1   Title  Patient will demonstrate she has returned to baseline related to left shoulder ROM and function post operatively.    Time  6    Period  Weeks    Status  On-going      PT LONG TERM GOAL #2   Title  Patient will increase left shoulder active abduction ROM to >/= 160 degrees for increased ease reaching overhead.    Baseline  114 degrees    Time  6    Period  Weeks    Status  New      PT LONG TERM GOAL #3   Title  Patient will increase left shoulder active flexion ROM to >/= 140 degrees for increased ease reaching overhead.    Baseline  88 degrees    Time  6    Period  Weeks    Status  New      PT LONG TERM GOAL #4   Title  Patient will report >/= 50% improvement in pain and tenderness in her left breast.    Time  6    Period  Weeks    Status  New      PT  LONG TERM GOAL #5   Title  Patient wil verbalize understanding of lymphedema risk reduction practices.    Time  6  Period  Weeks    Status  New      Breast Clinic Goals - 03/21/18 1527      Patient will be able to verbalize understanding of pertinent lymphedema risk reduction practices relevant to her diagnosis specifically related to skin care.   Time  1    Period  Days    Status  Achieved      Patient will be able to return demonstrate and/or verbalize understanding of the post-op home exercise program related to regaining shoulder range of motion.   Time  1    Period  Days    Status  Achieved      Patient will be able to verbalize understanding of the importance of attending the postoperative After Breast Cancer Class for further lymphedema risk reduction education and therapeutic exercise.   Time  1    Period  Days    Status  Achieved           Plan - 06/04/18 1437    Clinical Impression Statement  Pt. did well learning self-manual lymph drainage today. She may call her surgeon to have lymph node dissection area checked as it is very firm to the touch. She appeared to have achieved position for radiation with therapist placing left arm.    Rehab Potential  Excellent    Clinical Impairments Affecting Rehab Potential  None    PT Frequency  2x / week    PT Duration  8 weeks    PT Treatment/Interventions  ADLs/Self Care Home Management;Therapeutic activities;Therapeutic exercise;Patient/family education;Manual techniques;Manual lymph drainage;Scar mobilization;Passive range of motion    PT Next Visit Plan  continue Manual lymph drainage left breast, PROM and AAROM left shoulder, review self MLD prn, script back for compression/discuss options    PT Home Exercise Plan  post op shoulder ROM HEP    Consulted and Agree with Plan of Care  Patient       Patient will benefit from skilled therapeutic intervention in order to improve the following deficits and impairments:   Decreased knowledge of precautions, Impaired UE functional use, Decreased range of motion, Postural dysfunction, Decreased scar mobility, Decreased mobility, Pain, Increased fascial restricitons, Increased edema  Visit Diagnosis: Localized edema  Stiffness of left shoulder, not elsewhere classified     Problem List Patient Active Problem List   Diagnosis Date Noted  . Genetic testing 04/02/2018  . Family history of breast cancer   . Malignant neoplasm of upper-outer quadrant of left female breast ([place])   . Malignant neoplasm of upper-outer quadrant of left breast in female, estrogen receptor positive ([place]) 03/16/2018  . OA (osteoarthritis) of knee 09/11/2017  . Infrapatellar bursitis of right knee 04/29/2014  . Pneumonia, aspiration ([place]) 12/28/2012  . HYPOTHYROIDISM 11/15/2010  . HYPERLIPIDEMIA 11/15/2010  . DEPRESSION 11/15/2010  . GERD 11/15/2010  . CONSTIPATION 11/15/2010  . GALLSTONES 11/15/2010  . RENAL CYST 11/15/2010  . INSOMNIA UNSPECIFIED 11/15/2010  . COUGH 11/15/2010  . ABDOMINAL PAIN OTHER SPECIFIED SITE 11/15/2010  . TRANSAMINASES, SERUM, ELEVATED 11/15/2010    Eashan Schipani 06/04/2018, 2:40 PM  [place], [place], [address] Phone: [phone]   Fax:  [phone]  Name: [name] MRN: [id number] Date of Birth: [birth year]  Serafina Royals, PT 06/04/18 2:40 PM

## 2018-06-05 ENCOUNTER — Encounter: Payer: Self-pay | Admitting: Radiation Oncology

## 2018-06-05 ENCOUNTER — Ambulatory Visit
Admission: RE | Admit: 2018-06-05 | Discharge: 2018-06-05 | Disposition: A | Discharge status: Home / Self Care | Payer: Medicare Other | Source: Ambulatory Visit | Attending: Radiation Oncology | Admitting: Radiation Oncology

## 2018-06-05 ENCOUNTER — Other Ambulatory Visit: Payer: Self-pay

## 2018-06-05 VITALS — BP 142/81 | HR 87 | Temp 98.2°F | Resp 18 | Wt 166.8 lb

## 2018-06-05 DIAGNOSIS — F419 Anxiety disorder, unspecified: Secondary | ICD-10-CM | POA: Insufficient documentation

## 2018-06-05 DIAGNOSIS — C50412 Malignant neoplasm of upper-outer quadrant of left female breast: Secondary | ICD-10-CM | POA: Diagnosis not present

## 2018-06-05 DIAGNOSIS — Z170 Estrogen receptor positive status [ER+]: Principal | ICD-10-CM

## 2018-06-05 DIAGNOSIS — N644 Mastodynia: Secondary | ICD-10-CM | POA: Diagnosis not present

## 2018-06-05 DIAGNOSIS — L7633 Postprocedural seroma of skin and subcutaneous tissue following a dermatologic procedure: Secondary | ICD-10-CM | POA: Diagnosis not present

## 2018-06-05 DIAGNOSIS — Z79899 Other long term (current) drug therapy: Secondary | ICD-10-CM | POA: Insufficient documentation

## 2018-06-05 DIAGNOSIS — Z9889 Other specified postprocedural states: Secondary | ICD-10-CM | POA: Diagnosis not present

## 2018-06-05 MED ORDER — LORAZEPAM 0.5 MG PO TABS: ORAL_TABLET | ORAL | 0 refills | Status: DC

## 2018-06-05 NOTE — Progress Notes (Signed)
Radiation Oncology         [phone] ________________________________  Name: GENNETTE SHADIX MRN: [id number]  Date: 06/05/2018  DOB: 1946-03-31  Follow-Up Visit Note  Outpatient  CC: Shon Baton, MD  Rolm Bookbinder, MD  Diagnosis:      ICD-10-CM   1. Malignant neoplasm of upper-outer quadrant of left breast in female, estrogen receptor positive ([place]) C50.412 Ambulatory referral to Social Work   Z17.0 LORazepam (ATIVAN) 0.5 MG tablet    Ambulatory referral to Social Work      Cancer Staging Malignant neoplasm of upper-outer quadrant of left breast in female, estrogen receptor positive ([place]) Staging form: Breast, AJCC 8th Edition - Clinical stage from 03/21/2018: Stage IA (cT1c, cN0, cM0, G2, ER+, PR+, HER2-) - Unsigned - Pathologic: Stage IA (pT1c, pN0, cM0, G2, ER+, PR+, HER2-) - Unsigned   CHIEF COMPLAINT: Here to discuss management of Left breast cancer  Narrative:  The patient returns today for follow-up.     Since consultation date, 03/21/2018, she underwent the following imaging (dates and results as follows):   A Bilateral Breast MRI with and without contrast on 03/19/2018 showed: Left breast: a 3.3 cm mass and linear enhancement in the posterior aspect of the upper-outer quadrant of the left breast corresponding with the biopsy proven invasive ductal carcinoma and ductal carcinoma in-situ. A 1.1 cm mass in the UOQ of the left breast located 2.1 cm anterior and lateral to the biopsy proven malignant. Right breast: two indeterminate 5 mm adjacent enhancing nodules in the upper-inner quadrant of the right breast.   Breast or nodal biopsies, since consultation, involved (dates and results as follows):   On 04/02/2018 she underwent right breast, superior posterior, needle core biopsy that showed lobular neoplasia and right breast inferior anterior needle core biopsy showed no evidence of malignancy.   On 04/17/2018 she underwent bilateral breast lumpectomies with seeds.  Right breast showed lobular neoplasia (atypical lobular hyperplasia) and fibrocystic changes and adenosis. The left breast showed multifocal invasive lobular carcinoma, grade 2, largest spanning 1.5 cm. Lobular neoplasia (lobular carcinoma IN-SITU). Invasive tumor is present at the lateral margin broadly, medical margin broadly and posterior margin focally. The left inferior margin breast excision showed lobular neoplasia. All 4 left axilla lymph nodes biopsied, resulted negative.  On 05/15/2018 she underwent left breast excisions. There was no residual carcinoma identified to her left lateral breast margin. The left medial breast margin showed residual invasive lobular carcinoma (0.2 cm). Lobular neoplasia (atypical lobular hyperplasia) margins uninvolved by carcinoma. Adenosis present. Margins uninvolved by carcinoma. And finally the left posterior breast margin showed lobular neoplasia (atypical lobular hyperplasia). Adenosis present with no carcinoma identified.    Systemic therapy, if applicable, involved (dates and therapy as follows): She did not receive systemic therapy because her Oncotype was 14.   Symptomatically, the patient reports: Anxiety, stress, r/t diagnosis/treatment and other life events. Would like to speak with Education officer, museum. Stinging pain in left breast with seromas.         ALLERGIES:  is allergic to oxycontin [oxycodone hcl] and codeine.  Meds: Current Outpatient Medications  Medication Sig Dispense Refill  . acetaminophen (TYLENOL) 500 MG tablet Take 500 mg by mouth every 6 (six) hours as needed for mild pain or headache.    Marland Kitchen azelastine (ASTELIN) 0.1 % nasal spray Place 1 spray into both nostrils 2 (two) times daily. Use in each nostril as directed    . escitalopram (LEXAPRO) 10 MG tablet Take 15 mg by mouth daily.     Marland Kitchen  esomeprazole (NEXIUM) 40 MG capsule Take 40 mg by mouth 2 (two) times daily before a meal.    . ezetimibe (ZETIA) 10 MG tablet Take 10 mg by mouth at  bedtime.    . fluticasone (FLONASE) 50 MCG/ACT nasal spray Place 1 spray into both nostrils daily.    Marland Kitchen levothyroxine (SYNTHROID, LEVOTHROID) 50 MCG tablet Take 50 mcg by mouth at bedtime.    . naproxen sodium (ALEVE) 220 MG tablet Take 220 mg by mouth 2 (two) times daily as needed (pain).    Marland Kitchen OVER THE COUNTER MEDICATION Apply 1 application topically daily as needed (pain). Natural pain relief ointment    . Probiotic Product (PROBIOTIC ADVANCED PO) Take 1 tablet by mouth daily.    . rosuvastatin (CRESTOR) 5 MG tablet Take 5 mg by mouth at bedtime.     . sodium chloride (OCEAN) 0.65 % SOLN nasal spray Place 4 sprays into both nostrils 4 (four) times daily.    . traMADol (ULTRAM) 50 MG tablet Take 2 tablets (100 mg total) by mouth every 6 (six) hours as needed. 10 tablet 0  . zolpidem (AMBIEN) 10 MG tablet zolpidem 10 mg tablet  take 1 tablet by mouth at bedtime if needed    . Cholecalciferol (VITAMIN D3) 1000 units CAPS Take 1 capsule by mouth 2 (two) times daily.     Marland Kitchen LORazepam (ATIVAN) 0.5 MG tablet Take 1 tab 30 min before simulation and your first treatment. 2 tablet 0  . Magnesium 250 MG TABS Take 1 tablet by mouth daily.     . SUPER B COMPLEX/C PO Take 1 tablet by mouth daily.     No current facility-administered medications for this encounter.     Physical Findings:  weight is 166 lb 12.8 oz (75.7 kg). Her temperature is 98.2 F (36.8 C). Her blood pressure is 142/81 (abnormal) and her pulse is 87. Her respiration is 18 and oxygen saturation is 96%. .     General: Alert and oriented, in no acute distress Heart: Regular in rate and rhythm with no murmurs, rubs, or gallops. Chest: Clear to auscultation bilaterally, with no rhonchi, wheezes, or rales. Extremities: No cyanosis or edema. Musculoskeletal: symmetric strength and muscle tone throughout. Neurologic: No obvious focalities. Speech is fluent. She has a slight twitch in the head and neck that is continuous Psychiatric:  Judgment and insight are intact. Affect is appropriate. Breast exam reveals a moderate seroma in the left axilla and a small seroma in the lateral lumpectomy region. Right breast: central lumpectomy scar has healed well.   Lab Findings: Lab Results  Component Value Date   WBC 8.1 05/28/2018   HGB 12.3 05/28/2018   HCT 38.0 05/28/2018   MCV 84.3 05/28/2018   PLT 269 05/28/2018    Radiographic Findings:  as above   Impression/Plan: Stage T1c N0 M0 Left Breast UOQ Invasive Ductal Carcinoma with DCIS, ER+ / PR+ / Her2-, Grade 2  We discussed adjuvant radiotherapy today.  I recommend radiotherapy to the left breast in order to reduce risk of local regional recurrence by two thirds.  The risks, benefits and side effects of this treatment were discussed in detail.  She understands that radiotherapy is associated with skin irritation and fatigue in the acute setting. Late effects can include cosmetic changes and rare injury to internal organs.   She is enthusiastic about proceeding with treatment.   A total of 3 medically necessary complex treatment devices will be fabricated and supervised by me: 2  fields with MLCs for custom blocks to protect heart, and lungs;  and, a Vac-lok. MORE COMPLEX DEVICES MAY BE MADE IN DOSIMETRY FOR FIELD IN FIELD BEAMS FOR DOSE HOMOGENEITY.  I have requested : 3D Simulation which is medically necessary to give adequate dose to at risk tissues while sparing lungs and heart.  I have requested a DVH of the following structures: lungs, heart, left lumpectomy cavity.    The patient will receive 40.05 Gy in 15 fractions to the left breast with 2 fields.  This will be  followed by a boost.  Anxiety, stress, r/t diagnosis/treatment and other life events.  We spoke for a while about this and encouragement was given.  She has overcome quite a lot.  She would like to speak with Education officer, museum.  Referral made today. I'll also see if [facility] can provide a Curator.  Also, I  gave her a prescription for low-dose Ativan to take before simulation and before her first treatment due to anxiety.  She understands that ideally I would like to minimize medication for the remainder of her treatments to lessen the risk of side effects, and she shares this goal.  We will simulation before her vacation, and start RT after.  Vacation is 7-20 to 7-27.  I spent over 25 minutes face to face with the patient and more than 50% of that time was spent in counseling and/or coordination of care. _____________________________________   Eppie Gibson, MD   This document serves as a record of services personally performed by Eppie Gibson, MD. It was created on her behalf by Margit Banda, a trained medical scribe. The creation of this record is based on the scribe's personal observations and the provider's statements to them. This document has been checked and approved by the attending provider.

## 2018-06-06 ENCOUNTER — Ambulatory Visit: Payer: Medicare Other | Admitting: Rehabilitation

## 2018-06-06 ENCOUNTER — Encounter: Payer: Self-pay | Admitting: Rehabilitation

## 2018-06-06 ENCOUNTER — Encounter (HOSPITAL_COMMUNITY): Payer: Self-pay | Admitting: Anesthesiology

## 2018-06-06 DIAGNOSIS — M79602 Pain in left arm: Secondary | ICD-10-CM | POA: Diagnosis not present

## 2018-06-06 DIAGNOSIS — C50412 Malignant neoplasm of upper-outer quadrant of left female breast: Secondary | ICD-10-CM | POA: Diagnosis not present

## 2018-06-06 DIAGNOSIS — R600 Localized edema: Secondary | ICD-10-CM

## 2018-06-06 DIAGNOSIS — M25612 Stiffness of left shoulder, not elsewhere classified: Secondary | ICD-10-CM

## 2018-06-06 DIAGNOSIS — Z170 Estrogen receptor positive status [ER+]: Secondary | ICD-10-CM

## 2018-06-06 DIAGNOSIS — R293 Abnormal posture: Secondary | ICD-10-CM | POA: Diagnosis not present

## 2018-06-06 NOTE — Anesthesia Preprocedure Evaluation (Addendum)
Anesthesia Evaluation  Patient identified by MRN, date of birth, ID band Patient awake    Reviewed: Allergy & Precautions, NPO status , Patient's Chart, lab work & pertinent test results  History of Anesthesia Complications (+) history of anesthetic complications  Airway Mallampati: II  TM Distance: >3 FB Neck ROM: Full    Dental no notable dental hx. (+) Teeth Intact   Pulmonary pneumonia, resolved,  Hx/o aspiration pneumonia   Pulmonary exam normal breath sounds clear to auscultation       Cardiovascular Normal cardiovascular exam+ dysrhythmias  Rhythm:Regular Rate:Normal  EKG -incomplete RBBB pattern, LAFB   Neuro/Psych PSYCHIATRIC DISORDERS Anxiety Depression  Neuromuscular disease    GI/Hepatic hiatal hernia, GERD  Medicated and Controlled,  Endo/Other  Hypothyroidism Hyperlipidemia Hx/o left breast Ca  Renal/GU Renal diseaseHx/o renal cyst Hx/o renal calculi  negative genitourinary   Musculoskeletal  (+) Arthritis , Osteoarthritis,    Abdominal   Peds  Hematology negative hematology ROS (+)   Anesthesia Other Findings   Reproductive/Obstetrics Endometrial polyp                           Anesthesia Physical Anesthesia Plan  ASA: II  Anesthesia Plan: General   Post-op Pain Management:    Induction: Intravenous  PONV Risk Score and Plan: 4 or greater and Ondansetron, Dexamethasone and Treatment may vary due to age or medical condition  Airway Management Planned: Oral ETT  Additional Equipment:   Intra-op Plan:   Post-operative Plan: Extubation in OR  Informed Consent: I have reviewed the patients History and Physical, chart, labs and discussed the procedure including the risks, benefits and alternatives for the proposed anesthesia with the patient or authorized representative who has indicated his/her understanding and acceptance.   Dental advisory given  Plan  Discussed with: CRNA, Anesthesiologist and Surgeon  Anesthesia Plan Comments:        Anesthesia Quick Evaluation

## 2018-06-06 NOTE — Therapy (Signed)
[place], [place], [address] Phone: [phone]   Fax:  [phone]  Physical Therapy Treatment  Patient Details  Name: [name] MRN: [id number] Date of Birth: 1945-12-16 Referring Provider: Dr. Rolm Bookbinder   Encounter Date: 06/06/2018  PT End of Session - 06/06/18 1104    Visit Number  12    Number of Visits  17    Date for PT Re-Evaluation  07/05/18    Authorization Type  medicare/tricare    PT Start Time  0930    PT Stop Time  1014    PT Time Calculation (min)  44 min    Activity Tolerance  Patient tolerated treatment well    Behavior During Therapy  [facility] for tasks assessed/performed       Past Medical History:  Diagnosis Date  . Arthritis    hands and knees  . Complication of anesthesia    aspiration pna; following a colonoscopy, pt requests head of bed elevated if possible.  . Constipation   . Cough   . Depression   . Dysrhythmia    RBBB on 06-06-17 ekg   . Elevated liver function tests   . Esophageal stricture   . Family history of breast cancer   . Gallstones   . Genetic testing 04/02/2018   STAT Breast panel with reflex to Multi-Cancer panel (83 genes) @ [facility] - No pathogenic mutations detected  . GERD (gastroesophageal reflux disease)   . Hiatal hernia   . History of kidney stones   . Hyperlipemia   . Hypothyroidism   . Insomnia   . Malignant neoplasm of upper-outer quadrant of left female breast ([place])   . Nephrolithiasis   . Pneumonia 2015   aspirated after colonosopy  . Renal cyst     Past Surgical History:  Procedure Laterality Date  . BREAST LUMPECTOMY WITH RADIOACTIVE SEED AND SENTINEL LYMPH NODE BIOPSY Left 04/17/2018   Procedure: LEFT BREAST LUMPECTOMY WITH BRACKETED RADIOACTIVE SEEDS AND SENTINEL LYMPH NODE BIOPSY;  Surgeon: Rolm Bookbinder, MD;  Location: [facility];  Service: General;  Laterality: Left;  . BREAST SURGERY Left    Lumpectomy  . CARDIOVASCULAR STRESS TEST  07/19/2006   EF 70%, NO EVIDENCE OF ISCHEMIA  . CHOLECYSTECTOMY  12/30/10  . COLONOSCOPY    . DILATION AND CURETTAGE OF UTERUS    . inguinal herniography    . NASAL SINUS SURGERY    . RADIOACTIVE SEED GUIDED EXCISIONAL BREAST BIOPSY Right 04/17/2018   Procedure: RIGHT BREAST SEED GUIDED EXCISIONAL BIOPSY;  Surgeon: Rolm Bookbinder, MD;  Location: [facility];  Service: General;  Laterality: Right;  . RE-EXCISION OF BREAST LUMPECTOMY Left 05/15/2018   Procedure: RE-EXCISION OF LEFT BREAST LUMPECTOMY, ASPIRATION OF LEFT AXILLARY SEROMA;  Surgeon: Rolm Bookbinder, MD;  Location: [facility];  Service: General;  Laterality: Left;  . T & A    . TOOTH EXTRACTION    . TOTAL KNEE ARTHROPLASTY Left 09/11/2017   Procedure: LEFT TOTAL KNEE ARTHROPLASTY;  Surgeon: Gaynelle Arabian, MD;  Location: [facility];  Service: Orthopedics;  Laterality: Left;  with block  . US ECHOCARDIOGRAPHY  07/17/2006   EF 55-60%    There were no vitals filed for this visit.  Subjective Assessment - 06/06/18 0932    Subjective  Has my D and C tomorrow. Met with the radiation oncologist yesterday.  Starts 7/29.  Has the compression bras but still waiting for the compression sleeve script  to be signed back. (Gave pt a copy for friday appointment)   Still sore.  Dr. Isidore Moos said the axillary incision status seemed ok currently on yesterday's visit.      Pertinent History  Patient was diagnosed on 03/08/18 with left grade 1-2 invasive ductal carcinoma breast cancer.It measured 1.5 cm and is located in the upper outer quadrant. It is ER/PR positive and HER2 negative with a Ki67 of < 1%.  She had a left total knee replacement in 10/18 with Dr. Wynelle Link and continues to have pain in her left lateral knee. Her orthopedist reported it is likely due to her iliotibial band. Patient reports she underwent a left lumpectomy and SNLB (0/4 nodes positive) removed on 04/17/18.     Currently in Pain?  Yes    Pain Score  2     Pain Location  Breast    Pain Orientation  Left    Pain Type  Surgical pain    Pain Frequency  Intermittent                       [facility] Adult PT Treatment/Exercise - 06/06/18 0001      Self-Care   Self-Care  Other Self-Care Comments    Other Self-Care Comments   radiation garments and process education      Manual Therapy   Manual Lymphatic Drainage (MLD)  reviewed self MLD handout with reprint of handout to make some notations about what hand to use , etc.  Pt with good performance of techniques after cueing.  Abdominal breaths x 5, Rt axillary nodes, Lt inguinal nodes, anterior inter axillary pathway and axillo inguinal pathway and then retracing steps. Added some stationary circles over axillary incision due to puffiness here.  PT performed Rt sidelying MLD to lateral trunk and scapular region.     Passive ROM  In supine to left shoulder into er and radiation position, with gentle stretch to tolerance                  PT Long Term Goals - 05/24/18 1220      PT LONG TERM GOAL #1   Title  Patient will demonstrate she has returned to baseline related to left shoulder ROM and function post operatively.    Time  6    Period  Weeks    Status  On-going      PT LONG TERM GOAL #2   Title  Patient will increase left shoulder active abduction ROM to >/= 160 degrees for increased ease reaching overhead.    Baseline  114 degrees    Time  6    Period  Weeks    Status  New      PT LONG TERM GOAL #3   Title  Patient will increase left shoulder active flexion ROM to >/= 140 degrees for increased ease reaching overhead.    Baseline  88 degrees    Time  6    Period  Weeks    Status  New      PT LONG TERM GOAL #4   Title  Patient will report >/= 50% improvement in pain and tenderness in her left breast.    Time  6    Period  Weeks    Status  New      PT LONG TERM GOAL #5   Title  Patient wil verbalize  understanding of lymphedema risk reduction practices.    Time  6    Period  Weeks    Status  New      Breast Clinic Goals - 03/21/18 1527      Patient will be able to verbalize understanding of pertinent lymphedema risk reduction practices relevant to her diagnosis specifically related to skin care.   Time  1    Period  Days    Status  Achieved      Patient will be able to return demonstrate and/or verbalize understanding of the post-op home exercise program related to regaining shoulder range of motion.   Time  1    Period  Days    Status  Achieved      Patient will be able to verbalize understanding of the importance of attending the postoperative After Breast Cancer Class for further lymphedema risk reduction education and therapeutic exercise.   Time  1    Period  Days    Status  Achieved           Plan - 06/06/18 1104    Clinical Impression Statement  Pt reports elimination of pain after treatment today.  Continues with lumpiness under the axillary incision but not as painful or as much edema.  The breast edema seems be be also improving with most trouble at the incisions.  Shoulder ROM doing well.  Will return as tolerated after surgery tomorrow     Rehab Potential  Excellent    Clinical Impairments Affecting Rehab Potential  None    PT Frequency  2x / week    PT Duration  8 weeks    PT Treatment/Interventions  ADLs/Self Care Home Management;Therapeutic activities;Therapeutic exercise;Patient/family education;Manual techniques;Manual lymph drainage;Scar mobilization;Passive range of motion    PT Next Visit Plan  continue Manual lymph drainage left breast, PROM and AAROM left shoulder, review self MLD prn, script back for compression?/discuss options,     Consulted and Agree with Plan of Care  Patient       Patient will benefit from skilled therapeutic intervention in order to improve the following deficits and impairments:  Decreased knowledge of precautions, Impaired  UE functional use, Decreased range of motion, Postural dysfunction, Decreased scar mobility, Decreased mobility, Pain, Increased fascial restricitons, Increased edema  Visit Diagnosis: Localized edema  Stiffness of left shoulder, not elsewhere classified  Malignant neoplasm of upper-outer quadrant of left breast in female, estrogen receptor positive ([facility])  Abnormal posture     Problem List Patient Active Problem List   Diagnosis Date Noted  . Genetic testing 04/02/2018  . Family history of breast cancer   . Malignant neoplasm of upper-outer quadrant of left female breast ([place])   . Malignant neoplasm of upper-outer quadrant of left breast in female, estrogen receptor positive ([place]) 03/16/2018  . OA (osteoarthritis) of knee 09/11/2017  . Infrapatellar bursitis of right knee 04/29/2014  . Pneumonia, aspiration ([place]) 12/28/2012  . HYPOTHYROIDISM 11/15/2010  . HYPERLIPIDEMIA 11/15/2010  . DEPRESSION 11/15/2010  . GERD 11/15/2010  . CONSTIPATION 11/15/2010  . GALLSTONES 11/15/2010  . RENAL CYST 11/15/2010  . INSOMNIA UNSPECIFIED 11/15/2010  . COUGH 11/15/2010  . ABDOMINAL PAIN OTHER SPECIFIED SITE 11/15/2010  . TRANSAMINASES, SERUM, ELEVATED 11/15/2010    Shan Levans, PT 06/06/2018, 11:09 AM  [place], [place], [address] Phone: [phone]   Fax:  [phone]  Name: [name] MRN: [id number] Date of Birth: 03-31-1946

## 2018-06-07 ENCOUNTER — Ambulatory Visit (HOSPITAL_COMMUNITY)
Admission: AD | Admit: 2018-06-07 | Discharge: 2018-06-07 | Disposition: A | Discharge status: Home / Self Care | Payer: Medicare Other | Source: Ambulatory Visit | Attending: Obstetrics and Gynecology | Admitting: Obstetrics and Gynecology

## 2018-06-07 ENCOUNTER — Ambulatory Visit (HOSPITAL_COMMUNITY): Payer: Medicare Other | Admitting: Anesthesiology

## 2018-06-07 ENCOUNTER — Encounter (HOSPITAL_COMMUNITY): Payer: Self-pay

## 2018-06-07 ENCOUNTER — Other Ambulatory Visit: Payer: Self-pay

## 2018-06-07 ENCOUNTER — Telehealth: Payer: Self-pay | Admitting: *Deleted

## 2018-06-07 ENCOUNTER — Encounter (HOSPITAL_COMMUNITY)
Admission: AD | Disposition: A | Discharge status: Home / Self Care | Payer: Self-pay | Source: Ambulatory Visit | Attending: Obstetrics and Gynecology

## 2018-06-07 DIAGNOSIS — K219 Gastro-esophageal reflux disease without esophagitis: Secondary | ICD-10-CM | POA: Diagnosis not present

## 2018-06-07 DIAGNOSIS — F329 Major depressive disorder, single episode, unspecified: Secondary | ICD-10-CM | POA: Diagnosis not present

## 2018-06-07 DIAGNOSIS — E785 Hyperlipidemia, unspecified: Secondary | ICD-10-CM | POA: Insufficient documentation

## 2018-06-07 DIAGNOSIS — Z7951 Long term (current) use of inhaled steroids: Secondary | ICD-10-CM | POA: Insufficient documentation

## 2018-06-07 DIAGNOSIS — Z79890 Hormone replacement therapy (postmenopausal): Secondary | ICD-10-CM | POA: Diagnosis not present

## 2018-06-07 DIAGNOSIS — E039 Hypothyroidism, unspecified: Secondary | ICD-10-CM | POA: Diagnosis not present

## 2018-06-07 DIAGNOSIS — Z79899 Other long term (current) drug therapy: Secondary | ICD-10-CM | POA: Diagnosis not present

## 2018-06-07 DIAGNOSIS — N840 Polyp of corpus uteri: Secondary | ICD-10-CM | POA: Insufficient documentation

## 2018-06-07 DIAGNOSIS — C50412 Malignant neoplasm of upper-outer quadrant of left female breast: Secondary | ICD-10-CM | POA: Insufficient documentation

## 2018-06-07 DIAGNOSIS — Z96652 Presence of left artificial knee joint: Secondary | ICD-10-CM | POA: Diagnosis not present

## 2018-06-07 DIAGNOSIS — Z803 Family history of malignant neoplasm of breast: Secondary | ICD-10-CM | POA: Diagnosis not present

## 2018-06-07 HISTORY — PX: HYSTEROSCOPY WITH D & C: SHX1775

## 2018-06-07 SURGERY — DILATATION AND CURETTAGE /HYSTEROSCOPY
Anesthesia: General

## 2018-06-07 MED ORDER — LACTATED RINGERS IV SOLN
INTRAVENOUS | Status: DC
  Administered 2018-06-07: 08:00:00 via INTRAVENOUS
  Administered 2018-06-07: 125 mL/h via INTRAVENOUS
  Administered 2018-06-07: 09:00:00 via INTRAVENOUS

## 2018-06-07 MED ORDER — ONDANSETRON HCL 4 MG/2ML IJ SOLN
INTRAMUSCULAR | Status: AC
  Filled 2018-06-07: qty 2

## 2018-06-07 MED ORDER — ONDANSETRON HCL 4 MG/2ML IJ SOLN
INTRAMUSCULAR | Status: DC | PRN
  Administered 2018-06-07: 4 mg via INTRAVENOUS

## 2018-06-07 MED ORDER — LIDOCAINE HCL (CARDIAC) PF 100 MG/5ML IV SOSY
PREFILLED_SYRINGE | INTRAVENOUS | Status: AC
  Filled 2018-06-07: qty 5

## 2018-06-07 MED ORDER — SODIUM CHLORIDE 0.9 % IV SOLN
2.0000 g | INTRAVENOUS | Status: AC
  Administered 2018-06-07: 2 g via INTRAVENOUS

## 2018-06-07 MED ORDER — SUGAMMADEX SODIUM 200 MG/2ML IV SOLN
INTRAVENOUS | Status: DC | PRN
  Administered 2018-06-07: 200 mg via INTRAVENOUS

## 2018-06-07 MED ORDER — PROPOFOL 10 MG/ML IV BOLUS
INTRAVENOUS | Status: AC
  Filled 2018-06-07: qty 20

## 2018-06-07 MED ORDER — FENTANYL CITRATE (PF) 100 MCG/2ML IJ SOLN
INTRAMUSCULAR | Status: DC | PRN
  Administered 2018-06-07 (×2): 50 ug via INTRAVENOUS

## 2018-06-07 MED ORDER — FENTANYL CITRATE (PF) 100 MCG/2ML IJ SOLN
INTRAMUSCULAR | Status: AC
  Filled 2018-06-07: qty 2

## 2018-06-07 MED ORDER — PHENYLEPHRINE HCL 10 MG/ML IJ SOLN
INTRAMUSCULAR | Status: DC | PRN
  Administered 2018-06-07 (×2): 80 ug via INTRAVENOUS

## 2018-06-07 MED ORDER — ROCURONIUM BROMIDE 100 MG/10ML IV SOLN
INTRAVENOUS | Status: DC | PRN
  Administered 2018-06-07: 30 mg via INTRAVENOUS

## 2018-06-07 MED ORDER — SUGAMMADEX SODIUM 500 MG/5ML IV SOLN
INTRAVENOUS | Status: AC
  Filled 2018-06-07: qty 5

## 2018-06-07 MED ORDER — PROPOFOL 10 MG/ML IV BOLUS
INTRAVENOUS | Status: DC | PRN
  Administered 2018-06-07: 160 mg via INTRAVENOUS

## 2018-06-07 MED ORDER — SUCCINYLCHOLINE CHLORIDE 200 MG/10ML IV SOSY
PREFILLED_SYRINGE | INTRAVENOUS | Status: AC
  Filled 2018-06-07: qty 10

## 2018-06-07 MED ORDER — MEPERIDINE HCL 25 MG/ML IJ SOLN: 6.2500 mg | INTRAMUSCULAR | Status: DC | PRN

## 2018-06-07 MED ORDER — LIDOCAINE HCL 1 % IJ SOLN
INTRAMUSCULAR | Status: DC | PRN
Start: 2018-06-07 — End: 2018-06-07
  Administered 2018-06-07: 7 mL

## 2018-06-07 MED ORDER — FENTANYL CITRATE (PF) 100 MCG/2ML IJ SOLN
25.0000 ug | INTRAMUSCULAR | Status: DC | PRN
  Administered 2018-06-07: 50 ug via INTRAVENOUS

## 2018-06-07 MED ORDER — DEXAMETHASONE SODIUM PHOSPHATE 4 MG/ML IJ SOLN
INTRAMUSCULAR | Status: AC
  Filled 2018-06-07: qty 1

## 2018-06-07 MED ORDER — MIDAZOLAM HCL 2 MG/2ML IJ SOLN
INTRAMUSCULAR | Status: AC
  Filled 2018-06-07: qty 2

## 2018-06-07 MED ORDER — METOCLOPRAMIDE HCL 5 MG/ML IJ SOLN: 10.0000 mg | Freq: Once | INTRAMUSCULAR | Status: DC | PRN

## 2018-06-07 MED ORDER — LIDOCAINE HCL 1 % IJ SOLN
INTRAMUSCULAR | Status: AC
  Filled 2018-06-07: qty 20

## 2018-06-07 MED ORDER — SODIUM CHLORIDE 0.9 % IV SOLN
INTRAVENOUS | Status: AC
  Filled 2018-06-07: qty 2

## 2018-06-07 MED ORDER — SODIUM CHLORIDE 0.9 % IR SOLN
Status: DC | PRN
  Administered 2018-06-07: 3000 mL

## 2018-06-07 MED ORDER — LIDOCAINE HCL (CARDIAC) PF 100 MG/5ML IV SOSY
PREFILLED_SYRINGE | INTRAVENOUS | Status: DC | PRN
  Administered 2018-06-07: 80 mg via INTRAVENOUS

## 2018-06-07 SURGICAL SUPPLY — 16 items
BIPOLAR CUTTING LOOP 21FR (ELECTRODE)
CANISTER SUCT 3000ML PPV (MISCELLANEOUS) ×2 IMPLANT
CATH ROBINSON RED A/P 16FR (CATHETERS) ×2 IMPLANT
ELECT COAG BIPOL BALL 21FR (ELECTRODE) IMPLANT
ELECT REM PT RETURN 9FT ADLT (ELECTROSURGICAL)
ELECTRODE REM PT RTRN 9FT ADLT (ELECTROSURGICAL) IMPLANT
GLOVE BIO SURGEON STRL SZ 6.5 (GLOVE) ×2 IMPLANT
GLOVE BIOGEL PI IND STRL 7.0 (GLOVE) ×1 IMPLANT
GLOVE BIOGEL PI INDICATOR 7.0 (GLOVE) ×1
GOWN STRL REUS W/TWL LRG LVL3 (GOWN DISPOSABLE) ×4 IMPLANT
LOOP CUTTING BIPOLAR 21FR (ELECTRODE) IMPLANT
PACK VAGINAL MINOR WOMEN LF (CUSTOM PROCEDURE TRAY) ×2 IMPLANT
PAD OB MATERNITY 4.3X12.25 (PERSONAL CARE ITEMS) ×2 IMPLANT
TOWEL OR 17X24 6PK STRL BLUE (TOWEL DISPOSABLE) ×4 IMPLANT
TUBING AQUILEX INFLOW (TUBING) ×2 IMPLANT
TUBING AQUILEX OUTFLOW (TUBING) ×2 IMPLANT

## 2018-06-07 NOTE — Anesthesia Postprocedure Evaluation (Signed)
Anesthesia Post Note  Patient: [name]  Procedure(s) Performed: DILATATION AND CURETTAGE /HYSTEROSCOPY (N/A )     Patient location during evaluation: PACU Anesthesia Type: General Level of consciousness: awake and alert and oriented Pain management: pain level controlled Vital Signs Assessment: post-procedure vital signs reviewed and stable Respiratory status: spontaneous breathing, nonlabored ventilation and respiratory function stable Cardiovascular status: blood pressure returned to baseline and stable Postop Assessment: no apparent nausea or vomiting Anesthetic complications: no    Last Vitals:  Vitals:   06/07/18 0840 06/07/18 0845  BP:  134/78  Pulse: 83 73  Resp: 12 18  Temp:    SpO2: 93% 95%    Last Pain:  Vitals:   06/07/18 0845  TempSrc:   PainSc: 3    Pain Goal: Patients Stated Pain Goal: 2 (06/07/18 0845)               Loyed Wilmes A.

## 2018-06-07 NOTE — Telephone Encounter (Signed)
CALLED [facility] - SOCIAL WORKER TO INFORM THAT REFERRAL HAS BEEN MADE FOR THIS PT.

## 2018-06-07 NOTE — Anesthesia Procedure Notes (Signed)
Procedure Name: Intubation Date/Time: 06/07/2018 7:39 AM Performed by: Asher Muir, CRNA Pre-anesthesia Checklist: Patient identified, Emergency Drugs available, Suction available and Patient being monitored Patient Re-evaluated:Patient Re-evaluated prior to induction Oxygen Delivery Method: Circle system utilized Preoxygenation: Pre-oxygenation with 100% oxygen Induction Type: IV induction and Cricoid Pressure applied Ventilation: Mask ventilation without difficulty Laryngoscope Size: Mac, 3 and Glidescope Grade View: Grade I Tube type: Oral Tube size: 6.0 mm Number of attempts: 1 Airway Equipment and Method: Patient positioned with wedge pillow and Video-laryngoscopy Placement Confirmation: ETT inserted through vocal cords under direct vision,  positive ETCO2 and breath sounds checked- equal and bilateral Secured at: 20 cm Tube secured with: Tape Dental Injury: Teeth and Oropharynx as per pre-operative assessment  Comments: No signs of liquid in the airway.

## 2018-06-07 NOTE — Transfer of Care (Signed)
Immediate Anesthesia Transfer of Care Note  Patient: [name]  Procedure(s) Performed: DILATATION AND CURETTAGE /HYSTEROSCOPY (N/A )  Patient Location: PACU  Anesthesia Type:General  Level of Consciousness: sedated  Airway & Oxygen Therapy: Patient Spontanous Breathing and Patient connected to nasal cannula oxygen  Post-op Assessment: Report given to RN and Post -op Vital signs reviewed and stable  Post vital signs: Reviewed and stable  Last Vitals:  Vitals Value Taken Time  BP    Temp    Pulse    Resp    SpO2      Last Pain:  Vitals:   06/07/18 0605  TempSrc: Oral  PainSc: 4       Patients Stated Pain Goal: 2 (40/97/35 3299)  Complications: No apparent anesthesia complications

## 2018-06-07 NOTE — Brief Op Note (Signed)
06/07/2018  7:59 AM  PATIENT:  [name]  72 y.o. female  PRE-OPERATIVE DIAGNOSIS:   Breast Cancer Probable endometrial polyp  POST-OPERATIVE DIAGNOSIS:  Same Endometrial Polyp  PROCEDURE:  Procedure(s): DILATATION AND CURETTAGE /HYSTEROSCOPY (N/A)  Removal of endometrial polyp  SURGEON:  Surgeon(s) and Role:    * [name], MD - Primary  PHYSICIAN ASSISTANT:   ASSISTANTS: none   ANESTHESIA:   general and paracervical block  EBL:  minimal   BLOOD ADMINISTERED:none  DRAINS: none   LOCAL MEDICATIONS USED:  LIDOCAINE   SPECIMEN:  Source of Specimen:  endometrial curettings  DISPOSITION OF SPECIMEN:  PATHOLOGY  COUNTS:  YES  TOURNIQUET:  * No tourniquets in log *  DICTATION: .Other Dictation: Dictation Number dictated  PLAN OF CARE: Discharge to home after PACU  PATIENT DISPOSITION:  PACU - hemodynamically stable.   Delay start of Pharmacological VTE agent (>24hrs) due to surgical blood loss or risk of bleeding: not applicable

## 2018-06-07 NOTE — Discharge Instructions (Signed)
DISCHARGE INSTRUCTIONS: D&C / D&E The following instructions have been prepared to help you care for yourself upon your return home.   Personal hygiene:  Use sanitary pads for vaginal drainage, not tampons.  Shower the day after your procedure.  NO tub baths, pools or Jacuzzis for 2-3 weeks.  Wipe front to back after using the bathroom.  Activity and limitations:  Do NOT drive or operate any equipment for 24 hours. The effects of anesthesia are still present and drowsiness may result.  Do NOT rest in bed all day.  Walking is encouraged.  Walk up and down stairs slowly.  You may resume your normal activity in one to two days or as indicated by your physician.  Sexual activity: NO intercourse for at least 2 weeks after the procedure, or as indicated by your physician.  Diet: Eat a light meal as desired this evening. You may resume your usual diet tomorrow.  Return to work: You may resume your work activities in one to two days or as indicated by your doctor.  What to expect after your surgery: Expect to have vaginal bleeding/discharge for 2-3 days and spotting for up to 10 days. It is not unusual to have soreness for up to 1-2 weeks. You may have a slight burning sensation when you urinate for the first day. Mild cramps may continue for a couple of days. You may have a regular period in 2-6 weeks.  Call your doctor for any of the following:  Excessive vaginal bleeding, saturating and changing one pad every hour.  Inability to urinate 6 hours after discharge from hospital.  Pain not relieved by pain medication.  Fever of 100.4 F or greater.  Unusual vaginal discharge or odor.    Post Anesthesia Home Care Instructions  Activity: Get plenty of rest for the remainder of the day. A responsible individual must stay with you for 24 hours following the procedure.  For the next 24 hours, DO NOT: -Drive a car -Operate machinery -Drink alcoholic beverages -Take any medication unless  instructed by your physician -Make any legal decisions or sign important papers.  Meals: Start with liquid foods such as gelatin or soup. Progress to regular foods as tolerated. Avoid greasy, spicy, heavy foods. If nausea and/or vomiting occur, drink only clear liquids until the nausea and/or vomiting subsides. Call your physician if vomiting continues.  Special Instructions/Symptoms: Your throat may feel dry or sore from the anesthesia or the breathing tube placed in your throat during surgery. If this causes discomfort, gargle with warm salt water. The discomfort should disappear within 24 hours.  If you had a scopolamine patch placed behind your ear for the management of post- operative nausea and/or vomiting:  1. The medication in the patch is effective for 72 hours, after which it should be removed.  Wrap patch in a tissue and discard in the trash. Wash hands thoroughly with soap and water. 2. You may remove the patch earlier than 72 hours if you experience unpleasant side effects which may include dry mouth, dizziness or visual disturbances. 3. Avoid touching the patch. Wash your hands with soap and water after contact with the patch.     

## 2018-06-07 NOTE — Progress Notes (Signed)
H and p on the chart No significant changes Will proceed with Hysteroscopy, D and C Risks reviewed

## 2018-06-08 ENCOUNTER — Encounter (HOSPITAL_COMMUNITY): Payer: Self-pay | Admitting: Obstetrics and Gynecology

## 2018-06-08 DIAGNOSIS — G43B0 Ophthalmoplegic migraine, not intractable: Secondary | ICD-10-CM | POA: Diagnosis not present

## 2018-06-11 ENCOUNTER — Encounter: Payer: Self-pay | Admitting: *Deleted

## 2018-06-11 NOTE — Progress Notes (Signed)
[place] Work  Holiday representative received referral from radiation oncology for emotional support and resources.  CSW contacted patient at home to offer support and assess for psychosocial needs.  Patient stated she was feeling overwhelmed with diagnosis, treatment, and additional medical concerns outside of breast cancer.  CSW offered support and discussed common and appropriate feelings and concerns when going through treatment.  CSW and patient discussed support services and support team at [facility].  Patient was agreeable to an [place] referral and requested to be added to the [facility] support programs mailing list. CSW and patient discussed the importance of emotional support during treatment and encouraged patient to call with any additional needs, questions or concerns.  Johnnye Lana, MSW, LCSW, OSW-C Clinical Social Worker [facility] [phone]

## 2018-06-11 NOTE — Op Note (Signed)
NAME: [name], WAGLER MEDICAL RECORD YO:[id number] ACCOUNT [id number] DATE OF BIRTH:February 12, 1946 FACILITY: [facility] LOCATION: [facility] PHYSICIAN:Keenan Dimitrov Lynett Fish, MD  OPERATIVE REPORT  DATE OF PROCEDURE:  06/07/2018  PREOPERATIVE DIAGNOSIS:  Postmenopausal breast cancer and probable endometrial polyp.  POSTOPERATIVE DIAGNOSIS:  Postmenopausal breast cancer and probable endometrial polyp.  PROCEDURE:  Dilatation and curettage, hysteroscopy and removal of endometrial polyp.  SURGEON:  [name], MD  ANESTHESIA:  General with paracervical block.  ESTIMATED BLOOD LOSS:  Minimal.  COMPLICATIONS:  None.  DESCRIPTION OF PROCEDURE:  The patient was taken to the operating room where she was administered anesthesia.  She was then prepped and draped in usual sterile fashion.  Timeout was performed.  A speculum was inserted.  The cervix was grasped with a  tenaculum, and a paracervical block was performed in standard fashion.  The cervical internal os was gently dilated using Pratt dilators.  The diagnostic hysteroscope was inserted.  The uterine cavity was identified and visualized.  There was a small  endometrial polyp attached to the posterior wall of the uterus that appeared to be benign.  They hysteroscope was removed.  A sharp curette was inserted.  The uterus was thoroughly curetted of all tissue.  Polyp forceps were inserted, and tissue was  removed.  The hysteroscope was reinserted.  The uterine cavity was clean.  All instruments were removed from the vagina.  The patient tolerated the procedure well, and she went to recovery room in stable condition.  LN/NUANCE  D:06/11/2018 T:06/11/2018 JOB:[id number]

## 2018-06-12 ENCOUNTER — Encounter: Payer: Self-pay | Admitting: General Practice

## 2018-06-12 NOTE — Progress Notes (Signed)
[place] Psychosocial Distress Screening Clinical Social Work  Clinical Social Work was referred by distress screening protocol.  The patient scored a 7 on the Psychosocial Distress Thermometer which indicates moderate distress. Clinical Social Worker contacted patient by phone to assess for distress and other psychosocial needs. Patient not home, spoke w husband.  Reviewed options for increased support for patient.  HAs already been linked w [facility] [place].  Provided contact information for [employer] and [employer].  Encouraged to contact CSW for additional support as needed throughout treatment.    ONCBCN DISTRESS SCREENING 06/05/2018  Screening Type Initial Screening  Distress experienced in past week (1-10) 7  Emotional problem type Nervousness/Anxiety;Adjusting to illness;Isolation/feeling alone  Spiritual/Religous concerns type Loss of sense of purpose  Physical Problem type Pain;Sleep/insomnia;Tingling hands/feet    Clinical Social Worker follow up needed: No.  If yes, follow up plan:  Beverely Pace, [place], LCSW Clinical Social Worker Phone:  [phone]

## 2018-06-13 ENCOUNTER — Ambulatory Visit: Payer: Medicare Other | Attending: General Surgery | Admitting: Physical Therapy

## 2018-06-13 ENCOUNTER — Encounter: Payer: Self-pay | Admitting: Physical Therapy

## 2018-06-13 DIAGNOSIS — Z96652 Presence of left artificial knee joint: Secondary | ICD-10-CM | POA: Diagnosis not present

## 2018-06-13 DIAGNOSIS — M25612 Stiffness of left shoulder, not elsewhere classified: Secondary | ICD-10-CM | POA: Diagnosis not present

## 2018-06-13 DIAGNOSIS — R600 Localized edema: Secondary | ICD-10-CM | POA: Diagnosis not present

## 2018-06-13 DIAGNOSIS — Z170 Estrogen receptor positive status [ER+]: Secondary | ICD-10-CM | POA: Diagnosis not present

## 2018-06-13 DIAGNOSIS — G5782 Other specified mononeuropathies of left lower limb: Secondary | ICD-10-CM | POA: Diagnosis not present

## 2018-06-13 DIAGNOSIS — C50412 Malignant neoplasm of upper-outer quadrant of left female breast: Secondary | ICD-10-CM | POA: Insufficient documentation

## 2018-06-13 DIAGNOSIS — M79602 Pain in left arm: Secondary | ICD-10-CM | POA: Diagnosis not present

## 2018-06-13 DIAGNOSIS — R293 Abnormal posture: Secondary | ICD-10-CM | POA: Insufficient documentation

## 2018-06-13 NOTE — Therapy (Signed)
[place], [place], [address] Phone: [phone]   Fax:  [phone]  Physical Therapy Treatment  Patient Details  Name: [name] MRN: [id number] Date of Birth: April 09, 1946 Referring Provider: Dr. Rolm Bookbinder   Encounter Date: 06/13/2018  PT End of Session - 06/13/18 1523    Visit Number  13    Number of Visits  17    Date for PT Re-Evaluation  07/05/18    Authorization Type  medicare/tricare    PT Start Time  1303    PT Stop Time  1343    PT Time Calculation (min)  40 min    Activity Tolerance  Patient tolerated treatment well    Behavior During Therapy  [facility] for tasks assessed/performed       Past Medical History:  Diagnosis Date  . Arthritis    hands and knees  . Complication of anesthesia    aspiration pna; following a colonoscopy, pt requests head of bed elevated if possible.  . Constipation   . Cough   . Depression   . Dysrhythmia    RBBB on 06-06-17 ekg   . Elevated liver function tests   . Esophageal stricture   . Family history of breast cancer   . Gallstones   . Genetic testing 04/02/2018   STAT Breast panel with reflex to Multi-Cancer panel (83 genes) @ [facility] - No pathogenic mutations detected  . GERD (gastroesophageal reflux disease)   . Hiatal hernia   . History of kidney stones   . Hyperlipemia   . Hypothyroidism   . Insomnia   . Malignant neoplasm of upper-outer quadrant of left female breast ([place])   . Nephrolithiasis   . Pneumonia 2015   aspirated after colonosopy  . Renal cyst     Past Surgical History:  Procedure Laterality Date  . BREAST LUMPECTOMY WITH RADIOACTIVE SEED AND SENTINEL LYMPH NODE BIOPSY Left 04/17/2018   Procedure: LEFT BREAST LUMPECTOMY WITH BRACKETED RADIOACTIVE SEEDS AND SENTINEL LYMPH NODE BIOPSY;  Surgeon: Rolm Bookbinder, MD;  Location: [facility];  Service: General;  Laterality: Left;  . BREAST SURGERY Left    Lumpectomy  . CARDIOVASCULAR STRESS TEST  07/19/2006   EF 70%, NO EVIDENCE OF ISCHEMIA  . CHOLECYSTECTOMY  12/30/10  . COLONOSCOPY    . DILATION AND CURETTAGE OF UTERUS    . HYSTEROSCOPY W/D&C N/A 06/07/2018   Procedure: DILATATION AND CURETTAGE /HYSTEROSCOPY;  Surgeon: [name], MD;  Location: [facility];  Service: Gynecology;  Laterality: N/A;  . inguinal herniography    . NASAL SINUS SURGERY    . RADIOACTIVE SEED GUIDED EXCISIONAL BREAST BIOPSY Right 04/17/2018   Procedure: RIGHT BREAST SEED GUIDED EXCISIONAL BIOPSY;  Surgeon: Rolm Bookbinder, MD;  Location: [facility];  Service: General;  Laterality: Right;  . RE-EXCISION OF BREAST LUMPECTOMY Left 05/15/2018   Procedure: RE-EXCISION OF LEFT BREAST LUMPECTOMY, ASPIRATION OF LEFT AXILLARY SEROMA;  Surgeon: Rolm Bookbinder, MD;  Location: [facility];  Service: General;  Laterality: Left;  . T & A    . TOOTH EXTRACTION    . TOTAL KNEE ARTHROPLASTY Left 09/11/2017   Procedure: LEFT TOTAL KNEE ARTHROPLASTY;  Surgeon: Gaynelle Arabian, MD;  Location: [facility];  Service: Orthopedics;  Laterality: Left;  with block  . US ECHOCARDIOGRAPHY  07/17/2006   EF 55-60%    There were no vitals filed for this visit.  Subjective Assessment - 06/13/18 1306    Subjective  My swelling does not feel any different. I haven't done any exercises today. I rubbed vitamin E oil on it.     Pertinent History  Patient was diagnosed on 03/08/18 with left grade 1-2 invasive ductal carcinoma breast cancer.It measured 1.5 cm and is located in the upper outer quadrant. It is ER/PR positive and HER2 negative with a Ki67 of < 1%.  She had a left total knee replacement in 10/18 with Dr. Wynelle Link and continues to have pain in her left lateral knee. Her orthopedist reported it is likely due to her iliotibial band. Patient reports she underwent a left lumpectomy and SNLB (0/4 nodes positive) removed on 04/17/18.    Patient Stated Goals  Make sure my arm is  ok    Currently in Pain?  Yes    Pain Score  10-Worst pain ever    Pain Location  Knee    Pain Orientation  Left    Pain Descriptors / Indicators  Shooting                       [facility] Adult PT Treatment/Exercise - 06/13/18 0001      Manual Therapy   Manual Therapy  Manual Lymphatic Drainage (MLD);Myofascial release    Myofascial Release  gently to right lumpectomy scar- scar was much less raised and softer by end of session    Manual Lymphatic Drainage (MLD)  Instructed patient in the following: diaphragmatic breathing, short neck, right axilla and anterior interaxillary anastomosis, left groin and axillo-inguinal anastomosis, and left breast as well as lymph node dissection scar, directing toward patways, then to right sidelying to work on left axillo inguinal pathway then back to supine retracing pathways lymph node dissection scar is indurated                  PT Long Term Goals - 05/24/18 1220      PT LONG TERM GOAL #1   Title  Patient will demonstrate she has returned to baseline related to left shoulder ROM and function post operatively.    Time  6    Period  Weeks    Status  On-going      PT LONG TERM GOAL #2   Title  Patient will increase left shoulder active abduction ROM to >/= 160 degrees for increased ease reaching overhead.    Baseline  114 degrees    Time  6    Period  Weeks    Status  New      PT LONG TERM GOAL #3   Title  Patient will increase left shoulder active flexion ROM to >/= 140 degrees for increased ease reaching overhead.    Baseline  88 degrees    Time  6    Period  Weeks    Status  New      PT LONG TERM GOAL #4   Title  Patient will report >/= 50% improvement in pain and tenderness in her left breast.    Time  6    Period  Weeks    Status  New      PT LONG TERM GOAL #5   Title  Patient wil verbalize understanding of lymphedema risk reduction practices.    Time  6    Period  Weeks    Status  New      Breast  Clinic Goals - 03/21/18 1527      Patient will be able to verbalize understanding of pertinent lymphedema risk  reduction practices relevant to her diagnosis specifically related to skin care.   Time  1    Period  Days    Status  Achieved      Patient will be able to return demonstrate and/or verbalize understanding of the post-op home exercise program related to regaining shoulder range of motion.   Time  1    Period  Days    Status  Achieved      Patient will be able to verbalize understanding of the importance of attending the postoperative After Breast Cancer Class for further lymphedema risk reduction education and therapeutic exercise.   Time  1    Period  Days    Status  Achieved           Plan - 06/13/18 1523    Clinical Impression Statement  Continued MLD to left lateral axillary area and trunk. Also worked extensively on lumpectomy scar which was raised at beginning of session but flattend and softened by end of session. Re educated pt in proper self MLD technique and explained the reasonings behind using less pressure. Issued pt prescription to obtain compression garments today.    Rehab Potential  Excellent    Clinical Impairments Affecting Rehab Potential  None    PT Frequency  2x / week    PT Treatment/Interventions  ADLs/Self Care Home Management;Therapeutic activities;Therapeutic exercise;Patient/family education;Manual techniques;Manual lymph drainage;Scar mobilization;Passive range of motion    PT Next Visit Plan  continue Manual lymph drainage left breast, PROM and AAROM left shoulder, review self MLD prn,    PT Home Exercise Plan  post op shoulder ROM HEP    Consulted and Agree with Plan of Care  Patient       Patient will benefit from skilled therapeutic intervention in order to improve the following deficits and impairments:  Decreased knowledge of precautions, Impaired UE functional use, Decreased range of motion, Postural dysfunction, Decreased scar mobility,  Decreased mobility, Pain, Increased fascial restricitons, Increased edema  Visit Diagnosis: Localized edema     Problem List Patient Active Problem List   Diagnosis Date Noted  . Genetic testing 04/02/2018  . Family history of breast cancer   . Malignant neoplasm of upper-outer quadrant of left female breast ([place])   . Malignant neoplasm of upper-outer quadrant of left breast in female, estrogen receptor positive ([place]) 03/16/2018  . OA (osteoarthritis) of knee 09/11/2017  . Infrapatellar bursitis of right knee 04/29/2014  . Pneumonia, aspiration ([place]) 12/28/2012  . HYPOTHYROIDISM 11/15/2010  . HYPERLIPIDEMIA 11/15/2010  . DEPRESSION 11/15/2010  . GERD 11/15/2010  . CONSTIPATION 11/15/2010  . GALLSTONES 11/15/2010  . RENAL CYST 11/15/2010  . INSOMNIA UNSPECIFIED 11/15/2010  . COUGH 11/15/2010  . ABDOMINAL PAIN OTHER SPECIFIED SITE 11/15/2010  . TRANSAMINASES, SERUM, ELEVATED 11/15/2010    Allyson Sabal [facility] 06/13/2018, 3:27 PM  [place], [place], [address] Phone: [phone]   Fax:  [phone]  Name: [name] MRN: [id number] Date of Birth: [birth year]  Manus Gunning, PT 06/13/18 3:28 PM'

## 2018-06-15 ENCOUNTER — Ambulatory Visit: Payer: Medicare Other | Admitting: Physical Therapy

## 2018-06-15 ENCOUNTER — Encounter: Payer: Self-pay | Admitting: Physical Therapy

## 2018-06-15 ENCOUNTER — Other Ambulatory Visit: Payer: Self-pay

## 2018-06-15 DIAGNOSIS — M25612 Stiffness of left shoulder, not elsewhere classified: Secondary | ICD-10-CM

## 2018-06-15 DIAGNOSIS — C50412 Malignant neoplasm of upper-outer quadrant of left female breast: Secondary | ICD-10-CM | POA: Diagnosis not present

## 2018-06-15 DIAGNOSIS — M79602 Pain in left arm: Secondary | ICD-10-CM | POA: Diagnosis not present

## 2018-06-15 DIAGNOSIS — R293 Abnormal posture: Secondary | ICD-10-CM

## 2018-06-15 DIAGNOSIS — Z170 Estrogen receptor positive status [ER+]: Secondary | ICD-10-CM

## 2018-06-15 DIAGNOSIS — R600 Localized edema: Secondary | ICD-10-CM

## 2018-06-15 NOTE — Therapy (Signed)
[place], [place], [address] Phone: [phone]   Fax:  [phone]  Physical Therapy Treatment  Patient Details  Name: [name] MRN: [id number] Date of Birth: 10-Aug-1946 Referring Provider: Dr. Rolm Bookbinder   Encounter Date: 06/15/2018  PT End of Session - 06/15/18 1102    Visit Number  14    Number of Visits  17    Date for PT Re-Evaluation  07/05/18    Authorization Type  medicare/tricare    PT Start Time  1019    PT Stop Time  1102    PT Time Calculation (min)  43 min    Activity Tolerance  Patient tolerated treatment well    Behavior During Therapy  [facility] for tasks assessed/performed       Past Medical History:  Diagnosis Date  . Arthritis    hands and knees  . Complication of anesthesia    aspiration pna; following a colonoscopy, pt requests head of bed elevated if possible.  . Constipation   . Cough   . Depression   . Dysrhythmia    RBBB on 06-06-17 ekg   . Elevated liver function tests   . Esophageal stricture   . Family history of breast cancer   . Gallstones   . Genetic testing 04/02/2018   STAT Breast panel with reflex to Multi-Cancer panel (83 genes) @ [facility] - No pathogenic mutations detected  . GERD (gastroesophageal reflux disease)   . Hiatal hernia   . History of kidney stones   . Hyperlipemia   . Hypothyroidism   . Insomnia   . Malignant neoplasm of upper-outer quadrant of left female breast ([place])   . Nephrolithiasis   . Pneumonia 2015   aspirated after colonosopy  . Renal cyst     Past Surgical History:  Procedure Laterality Date  . BREAST LUMPECTOMY WITH RADIOACTIVE SEED AND SENTINEL LYMPH NODE BIOPSY Left 04/17/2018   Procedure: LEFT BREAST LUMPECTOMY WITH BRACKETED RADIOACTIVE SEEDS AND SENTINEL LYMPH NODE BIOPSY;  Surgeon: Rolm Bookbinder, MD;  Location: [facility];  Service: General;  Laterality: Left;  . BREAST SURGERY Left     Lumpectomy  . CARDIOVASCULAR STRESS TEST  07/19/2006   EF 70%, NO EVIDENCE OF ISCHEMIA  . CHOLECYSTECTOMY  12/30/10  . COLONOSCOPY    . DILATION AND CURETTAGE OF UTERUS    . HYSTEROSCOPY W/D&C N/A 06/07/2018   Procedure: DILATATION AND CURETTAGE /HYSTEROSCOPY;  Surgeon: [name], MD;  Location: [facility];  Service: Gynecology;  Laterality: N/A;  . inguinal herniography    . NASAL SINUS SURGERY    . RADIOACTIVE SEED GUIDED EXCISIONAL BREAST BIOPSY Right 04/17/2018   Procedure: RIGHT BREAST SEED GUIDED EXCISIONAL BIOPSY;  Surgeon: Rolm Bookbinder, MD;  Location: [facility];  Service: General;  Laterality: Right;  . RE-EXCISION OF BREAST LUMPECTOMY Left 05/15/2018   Procedure: RE-EXCISION OF LEFT BREAST LUMPECTOMY, ASPIRATION OF LEFT AXILLARY SEROMA;  Surgeon: Rolm Bookbinder, MD;  Location: [facility];  Service: General;  Laterality: Left;  . T & A    . TOOTH EXTRACTION    . TOTAL KNEE ARTHROPLASTY Left 09/11/2017   Procedure: LEFT TOTAL KNEE ARTHROPLASTY;  Surgeon: Gaynelle Arabian, MD;  Location: [facility];  Service: Orthopedics;  Laterality: Left;  with block  . US ECHOCARDIOGRAPHY  07/17/2006   EF 55-60%    There were no vitals filed for this visit.  Subjective Assessment - 06/15/18 1022    Subjective  I found out I have a neuroma on my left saphenous vein and that's what is causing my knee pain. They're treating it but I might need surgery again. I think my breast scar tissue is getting better.    Pertinent History  Patient was diagnosed on 03/08/18 with left grade 1-2 invasive ductal carcinoma breast cancer.It measured 1.5 cm and is located in the upper outer quadrant. It is ER/PR positive and HER2 negative with a Ki67 of < 1%.  She had a left total knee replacement in 10/18 with Dr. Wynelle Link and continues to have pain in her left lateral knee. Her orthopedist reported it is likely due to her iliotibial band. Patient reports she underwent a left lumpectomy and  SNLB (0/4 nodes positive) removed on 04/17/18.    Patient Stated Goals  Reduce breast swelling     Currently in Pain?  No/denies                       [facility] Adult PT Treatment/Exercise - 06/15/18 0001      Manual Therapy   Manual Therapy  Manual Lymphatic Drainage (MLD);Myofascial release    Myofascial Release  gently to right lumpectomy scar- scar was much less raised and softer by end of session    Manual Lymphatic Drainage (MLD)  Diaphragmatic breathing, short neck, right axilla and anterior interaxillary anastomosis, left groin and axillo-inguinal anastomosis, and left breast as well as lymph node dissection scar, directing toward patways, then to right sidelying to work on left axillo inguinal pathway then back to supine retracing pathways lymph node dissection scar is indurated                  PT Long Term Goals - 05/24/18 1220      PT LONG TERM GOAL #1   Title  Patient will demonstrate she has returned to baseline related to left shoulder ROM and function post operatively.    Time  6    Period  Weeks    Status  On-going      PT LONG TERM GOAL #2   Title  Patient will increase left shoulder active abduction ROM to >/= 160 degrees for increased ease reaching overhead.    Baseline  114 degrees    Time  6    Period  Weeks    Status  New      PT LONG TERM GOAL #3   Title  Patient will increase left shoulder active flexion ROM to >/= 140 degrees for increased ease reaching overhead.    Baseline  88 degrees    Time  6    Period  Weeks    Status  New      PT LONG TERM GOAL #4   Title  Patient will report >/= 50% improvement in pain and tenderness in her left breast.    Time  6    Period  Weeks    Status  New      PT LONG TERM GOAL #5   Title  Patient wil verbalize understanding of lymphedema risk reduction practices.    Time  6    Period  Weeks    Status  New      Breast Clinic Goals - 03/21/18 1527      Patient will be able to verbalize  understanding of pertinent lymphedema risk reduction practices relevant to her diagnosis specifically related to skin care.   Time  1    Period  Days  Status  Achieved      Patient will be able to return demonstrate and/or verbalize understanding of the post-op home exercise program related to regaining shoulder range of motion.   Time  1    Period  Days    Status  Achieved      Patient will be able to verbalize understanding of the importance of attending the postoperative After Breast Cancer Class for further lymphedema risk reduction education and therapeutic exercise.   Time  1    Period  Days    Status  Achieved           Plan - 06/15/18 1102    Clinical Impression Statement  Breast scar appears softer and more mobile. Edema continues t be present but appears improved compared to post op visit. She will benefit from continued PT to continue reducing breast edema to prevent further aspiration by surgeon.    PT Frequency  2x / week    PT Duration  8 weeks    PT Treatment/Interventions  ADLs/Self Care Home Management;Therapeutic activities;Therapeutic exercise;Patient/family education;Manual techniques;Manual lymph drainage;Scar mobilization;Passive range of motion    PT Next Visit Plan  continue Manual lymph drainage left breast, PROM and AAROM left shoulder, review self MLD prn,    Consulted and Agree with Plan of Care  Patient       Patient will benefit from skilled therapeutic intervention in order to improve the following deficits and impairments:  Decreased knowledge of precautions, Impaired UE functional use, Decreased range of motion, Postural dysfunction, Decreased scar mobility, Decreased mobility, Pain, Increased fascial restricitons, Increased edema  Visit Diagnosis: Localized edema  Stiffness of left shoulder, not elsewhere classified  Malignant neoplasm of upper-outer quadrant of left breast in female, estrogen receptor positive ([facility])  Abnormal  posture  Pain in left arm     Problem List Patient Active Problem List   Diagnosis Date Noted  . Genetic testing 04/02/2018  . Family history of breast cancer   . Malignant neoplasm of upper-outer quadrant of left female breast ([place])   . Malignant neoplasm of upper-outer quadrant of left breast in female, estrogen receptor positive ([place]) 03/16/2018  . OA (osteoarthritis) of knee 09/11/2017  . Infrapatellar bursitis of right knee 04/29/2014  . Pneumonia, aspiration ([place]) 12/28/2012  . HYPOTHYROIDISM 11/15/2010  . HYPERLIPIDEMIA 11/15/2010  . DEPRESSION 11/15/2010  . GERD 11/15/2010  . CONSTIPATION 11/15/2010  . GALLSTONES 11/15/2010  . RENAL CYST 11/15/2010  . INSOMNIA UNSPECIFIED 11/15/2010  . COUGH 11/15/2010  . ABDOMINAL PAIN OTHER SPECIFIED SITE 11/15/2010  . TRANSAMINASES, SERUM, ELEVATED 11/15/2010    [name], PT 06/15/18 11:04 AM  [place], [place], [address] Phone: [phone]   Fax:  [phone]  Name: [name] MRN: [id number] Date of Birth: 05-04-1946

## 2018-06-16 ENCOUNTER — Other Ambulatory Visit: Payer: Self-pay | Admitting: Oncology

## 2018-06-18 ENCOUNTER — Encounter: Payer: Self-pay | Admitting: Physical Therapy

## 2018-06-18 ENCOUNTER — Ambulatory Visit: Payer: Medicare Other | Admitting: Physical Therapy

## 2018-06-18 DIAGNOSIS — C50412 Malignant neoplasm of upper-outer quadrant of left female breast: Secondary | ICD-10-CM | POA: Diagnosis not present

## 2018-06-18 DIAGNOSIS — R600 Localized edema: Secondary | ICD-10-CM

## 2018-06-18 DIAGNOSIS — M79602 Pain in left arm: Secondary | ICD-10-CM | POA: Diagnosis not present

## 2018-06-18 DIAGNOSIS — Z170 Estrogen receptor positive status [ER+]: Secondary | ICD-10-CM | POA: Diagnosis not present

## 2018-06-18 DIAGNOSIS — R293 Abnormal posture: Secondary | ICD-10-CM | POA: Diagnosis not present

## 2018-06-18 DIAGNOSIS — M25612 Stiffness of left shoulder, not elsewhere classified: Secondary | ICD-10-CM | POA: Diagnosis not present

## 2018-06-18 NOTE — Therapy (Signed)
[place], [place], [address] Phone: [phone]   Fax:  [phone]  Physical Therapy Treatment  Patient Details  Name: [name] MRN: [id number] Date of Birth: May 18, 1946 Referring Provider: Dr. Rolm Bookbinder   Encounter Date: 06/18/2018  PT End of Session - 06/18/18 1718    Visit Number  15    Number of Visits  17    Date for PT Re-Evaluation  07/05/18    Authorization Type  medicare/tricare    PT Start Time  1520    PT Stop Time  1603    PT Time Calculation (min)  43 min    Activity Tolerance  Patient tolerated treatment well    Behavior During Therapy  [facility] for tasks assessed/performed       Past Medical History:  Diagnosis Date  . Arthritis    hands and knees  . Complication of anesthesia    aspiration pna; following a colonoscopy, pt requests head of bed elevated if possible.  . Constipation   . Cough   . Depression   . Dysrhythmia    RBBB on 06-06-17 ekg   . Elevated liver function tests   . Esophageal stricture   . Family history of breast cancer   . Gallstones   . Genetic testing 04/02/2018   STAT Breast panel with reflex to Multi-Cancer panel (83 genes) @ [facility] - No pathogenic mutations detected  . GERD (gastroesophageal reflux disease)   . Hiatal hernia   . History of kidney stones   . Hyperlipemia   . Hypothyroidism   . Insomnia   . Malignant neoplasm of upper-outer quadrant of left female breast ([place])   . Nephrolithiasis   . Pneumonia 2015   aspirated after colonosopy  . Renal cyst     Past Surgical History:  Procedure Laterality Date  . BREAST LUMPECTOMY WITH RADIOACTIVE SEED AND SENTINEL LYMPH NODE BIOPSY Left 04/17/2018   Procedure: LEFT BREAST LUMPECTOMY WITH BRACKETED RADIOACTIVE SEEDS AND SENTINEL LYMPH NODE BIOPSY;  Surgeon: Rolm Bookbinder, MD;  Location: [facility];  Service: General;  Laterality: Left;  . BREAST SURGERY Left    Lumpectomy  . CARDIOVASCULAR STRESS TEST  07/19/2006   EF 70%, NO EVIDENCE OF ISCHEMIA  . CHOLECYSTECTOMY  12/30/10  . COLONOSCOPY    . DILATION AND CURETTAGE OF UTERUS    . HYSTEROSCOPY W/D&C N/A 06/07/2018   Procedure: DILATATION AND CURETTAGE /HYSTEROSCOPY;  Surgeon: [name], MD;  Location: [facility];  Service: Gynecology;  Laterality: N/A;  . inguinal herniography    . NASAL SINUS SURGERY    . RADIOACTIVE SEED GUIDED EXCISIONAL BREAST BIOPSY Right 04/17/2018   Procedure: RIGHT BREAST SEED GUIDED EXCISIONAL BIOPSY;  Surgeon: Rolm Bookbinder, MD;  Location: [facility];  Service: General;  Laterality: Right;  . RE-EXCISION OF BREAST LUMPECTOMY Left 05/15/2018   Procedure: RE-EXCISION OF LEFT BREAST LUMPECTOMY, ASPIRATION OF LEFT AXILLARY SEROMA;  Surgeon: Rolm Bookbinder, MD;  Location: [facility];  Service: General;  Laterality: Left;  . T & A    . TOOTH EXTRACTION    . TOTAL KNEE ARTHROPLASTY Left 09/11/2017   Procedure: LEFT TOTAL KNEE ARTHROPLASTY;  Surgeon: Gaynelle Arabian, MD;  Location: [facility];  Service: Orthopedics;  Laterality: Left;  with block  . US ECHOCARDIOGRAPHY  07/17/2006   EF 55-60%    There were no vitals filed for this visit.  Subjective Assessment - 06/18/18 1521    Subjective  I am going to another PT on Thursday for my knee. I can't tell a difference in my swelling. My scar is puckered    Pertinent History  Patient was diagnosed on 03/08/18 with left grade 1-2 invasive ductal carcinoma breast cancer.It measured 1.5 cm and is located in the upper outer quadrant. It is ER/PR positive and HER2 negative with a Ki67 of < 1%.  She had a left total knee replacement in 10/18 with Dr. Wynelle Link and continues to have pain in her left lateral knee. Her orthopedist reported it is likely due to her iliotibial band. Patient reports she underwent a left lumpectomy and SNLB (0/4 nodes positive) removed on 04/17/18.    Patient Stated Goals  Reduce breast  swelling     Currently in Pain?  No/denies    Pain Score  0-No pain                       [facility] Adult PT Treatment/Exercise - 06/18/18 0001      Manual Therapy   Myofascial Release  gently to right lumpectomy scar- scar was much less raised and softer by end of session    Manual Lymphatic Drainage (MLD)  Diaphragmatic breathing, short neck, right axilla and anterior interaxillary anastomosis, left groin and axillo-inguinal anastomosis, and left breast as well as lymph node dissection scar, directing toward patways lymph node dissection scar is indurated                  PT Long Term Goals - 05/24/18 1220      PT LONG TERM GOAL #1   Title  Patient will demonstrate she has returned to baseline related to left shoulder ROM and function post operatively.    Time  6    Period  Weeks    Status  On-going      PT LONG TERM GOAL #2   Title  Patient will increase left shoulder active abduction ROM to >/= 160 degrees for increased ease reaching overhead.    Baseline  114 degrees    Time  6    Period  Weeks    Status  New      PT LONG TERM GOAL #3   Title  Patient will increase left shoulder active flexion ROM to >/= 140 degrees for increased ease reaching overhead.    Baseline  88 degrees    Time  6    Period  Weeks    Status  New      PT LONG TERM GOAL #4   Title  Patient will report >/= 50% improvement in pain and tenderness in her left breast.    Time  6    Period  Weeks    Status  New      PT LONG TERM GOAL #5   Title  Patient wil verbalize understanding of lymphedema risk reduction practices.    Time  6    Period  Weeks    Status  New      Breast Clinic Goals - 03/21/18 1527      Patient will be able to verbalize understanding of pertinent lymphedema risk reduction practices relevant to her diagnosis specifically related to skin care.   Time  1    Period  Days    Status  Achieved      Patient will be able to return demonstrate and/or  verbalize understanding of the post-op home exercise program related to regaining shoulder range of motion.  Time  1    Period  Days    Status  Achieved      Patient will be able to verbalize understanding of the importance of attending the postoperative After Breast Cancer Class for further lymphedema risk reduction education and therapeutic exercise.   Time  1    Period  Days    Status  Achieved           Plan - 06/18/18 1718    Clinical Impression Statement  Continued with MLD to breast and left axilla. Left axilla feels less full today following MLD. Also continued scar mobilization to left lumpectomy scar. Pt felt there was more dimpling in this area but it did improve with massage today.     Rehab Potential  Excellent    Clinical Impairments Affecting Rehab Potential  None    PT Frequency  2x / week    PT Duration  8 weeks    PT Treatment/Interventions  ADLs/Self Care Home Management;Therapeutic activities;Therapeutic exercise;Patient/family education;Manual techniques;Manual lymph drainage;Scar mobilization;Passive range of motion    PT Next Visit Plan  continue Manual lymph drainage left breast, PROM and AAROM left shoulder, review self MLD prn,    PT Home Exercise Plan  post op shoulder ROM HEP    Consulted and Agree with Plan of Care  Patient       Patient will benefit from skilled therapeutic intervention in order to improve the following deficits and impairments:  Decreased knowledge of precautions, Impaired UE functional use, Decreased range of motion, Postural dysfunction, Decreased scar mobility, Decreased mobility, Pain, Increased fascial restricitons, Increased edema  Visit Diagnosis: Localized edema     Problem List Patient Active Problem List   Diagnosis Date Noted  . Genetic testing 04/02/2018  . Family history of breast cancer   . Malignant neoplasm of upper-outer quadrant of left female breast ([place])   . Malignant neoplasm of upper-outer quadrant of  left breast in female, estrogen receptor positive ([place]) 03/16/2018  . OA (osteoarthritis) of knee 09/11/2017  . Infrapatellar bursitis of right knee 04/29/2014  . Pneumonia, aspiration ([place]) 12/28/2012  . HYPOTHYROIDISM 11/15/2010  . HYPERLIPIDEMIA 11/15/2010  . DEPRESSION 11/15/2010  . GERD 11/15/2010  . CONSTIPATION 11/15/2010  . GALLSTONES 11/15/2010  . RENAL CYST 11/15/2010  . INSOMNIA UNSPECIFIED 11/15/2010  . COUGH 11/15/2010  . ABDOMINAL PAIN OTHER SPECIFIED SITE 11/15/2010  . TRANSAMINASES, SERUM, ELEVATED 11/15/2010    Allyson Sabal [facility] 06/18/2018, 5:20 PM  [place], [place], [address] Phone: [phone]   Fax:  [phone]  Name: [name] MRN: [id number] Date of Birth: 11-05-1946  Manus Gunning, PT 06/18/18 5:20 PM

## 2018-06-20 ENCOUNTER — Encounter: Payer: Self-pay | Admitting: Physical Therapy

## 2018-06-20 ENCOUNTER — Ambulatory Visit: Payer: Medicare Other | Admitting: Physical Therapy

## 2018-06-20 DIAGNOSIS — M25612 Stiffness of left shoulder, not elsewhere classified: Secondary | ICD-10-CM | POA: Diagnosis not present

## 2018-06-20 DIAGNOSIS — R600 Localized edema: Secondary | ICD-10-CM | POA: Diagnosis not present

## 2018-06-20 DIAGNOSIS — M79602 Pain in left arm: Secondary | ICD-10-CM

## 2018-06-20 DIAGNOSIS — C50412 Malignant neoplasm of upper-outer quadrant of left female breast: Secondary | ICD-10-CM | POA: Diagnosis not present

## 2018-06-20 DIAGNOSIS — Z170 Estrogen receptor positive status [ER+]: Secondary | ICD-10-CM | POA: Diagnosis not present

## 2018-06-20 DIAGNOSIS — R293 Abnormal posture: Secondary | ICD-10-CM | POA: Diagnosis not present

## 2018-06-20 NOTE — Therapy (Signed)
[place], [place], [address] Phone: [phone]   Fax:  [phone]  Physical Therapy Treatment  Patient Details  Name: [name] MRN: [id number] Date of Birth: [birth year] Referring Provider: Dr. Rolm Bookbinder   Encounter Date: 06/20/2018  PT End of Session - 06/20/18 1345    Visit Number  16    Number of Visits  17    Date for PT Re-Evaluation  07/05/18    Authorization Type  medicare/tricare    PT Start Time  1305    PT Stop Time  1345    PT Time Calculation (min)  40 min    Activity Tolerance  Patient tolerated treatment well    Behavior During Therapy  [facility] for tasks assessed/performed       Past Medical History:  Diagnosis Date  . Arthritis    hands and knees  . Complication of anesthesia    aspiration pna; following a colonoscopy, pt requests head of bed elevated if possible.  . Constipation   . Cough   . Depression   . Dysrhythmia    RBBB on 06-06-17 ekg   . Elevated liver function tests   . Esophageal stricture   . Family history of breast cancer   . Gallstones   . Genetic testing 04/02/2018   STAT Breast panel with reflex to Multi-Cancer panel (83 genes) @ [facility] - No pathogenic mutations detected  . GERD (gastroesophageal reflux disease)   . Hiatal hernia   . History of kidney stones   . Hyperlipemia   . Hypothyroidism   . Insomnia   . Malignant neoplasm of upper-outer quadrant of left female breast ([place])   . Nephrolithiasis   . Pneumonia 2015   aspirated after colonosopy  . Renal cyst     Past Surgical History:  Procedure Laterality Date  . BREAST LUMPECTOMY WITH RADIOACTIVE SEED AND SENTINEL LYMPH NODE BIOPSY Left 04/17/2018   Procedure: LEFT BREAST LUMPECTOMY WITH BRACKETED RADIOACTIVE SEEDS AND SENTINEL LYMPH NODE BIOPSY;  Surgeon: Rolm Bookbinder, MD;  Location: [facility];  Service: General;  Laterality: Left;  . BREAST SURGERY Left     Lumpectomy  . CARDIOVASCULAR STRESS TEST  07/19/2006   EF 70%, NO EVIDENCE OF ISCHEMIA  . CHOLECYSTECTOMY  12/30/10  . COLONOSCOPY    . DILATION AND CURETTAGE OF UTERUS    . HYSTEROSCOPY W/D&C N/A 06/07/2018   Procedure: DILATATION AND CURETTAGE /HYSTEROSCOPY;  Surgeon: [name], MD;  Location: [facility];  Service: Gynecology;  Laterality: N/A;  . inguinal herniography    . NASAL SINUS SURGERY    . RADIOACTIVE SEED GUIDED EXCISIONAL BREAST BIOPSY Right 04/17/2018   Procedure: RIGHT BREAST SEED GUIDED EXCISIONAL BIOPSY;  Surgeon: Rolm Bookbinder, MD;  Location: [facility];  Service: General;  Laterality: Right;  . RE-EXCISION OF BREAST LUMPECTOMY Left 05/15/2018   Procedure: RE-EXCISION OF LEFT BREAST LUMPECTOMY, ASPIRATION OF LEFT AXILLARY SEROMA;  Surgeon: Rolm Bookbinder, MD;  Location: [facility];  Service: General;  Laterality: Left;  . T & A    . TOOTH EXTRACTION    . TOTAL KNEE ARTHROPLASTY Left 09/11/2017   Procedure: LEFT TOTAL KNEE ARTHROPLASTY;  Surgeon: Gaynelle Arabian, MD;  Location: [facility];  Service: Orthopedics;  Laterality: Left;  with block  . US ECHOCARDIOGRAPHY  07/17/2006   EF 55-60%    There were no vitals filed for this visit.  Subjective Assessment - 06/20/18 1306    Subjective  I think my scar looks pretty good.     Pertinent History  Patient was diagnosed on 03/08/18 with left grade 1-2 invasive ductal carcinoma breast cancer.It measured 1.5 cm and is located in the upper outer quadrant. It is ER/PR positive and HER2 negative with a Ki67 of < 1%.  She had a left total knee replacement in 10/18 with Dr. Wynelle Link and continues to have pain in her left lateral knee. Her orthopedist reported it is likely due to her iliotibial band. Patient reports she underwent a left lumpectomy and SNLB (0/4 nodes positive) removed on 04/17/18.    Patient Stated Goals  Reduce breast swelling     Currently in Pain?  No/denies    Pain Score  0-No pain          [facility] PT Assessment - 06/20/18 0001      AROM   Left Shoulder Flexion  143 Degrees    Left Shoulder ABduction  112 Degrees                   [facility] Adult PT Treatment/Exercise - 06/20/18 0001      Exercises   Exercises  Shoulder      Shoulder Exercises: Supine   Flexion  AAROM;Both;10 reps with dowel, pt returned therapist demo with 5 sec holds    ABduction  AAROM;Left;10 reps with dowel, pt returned therapist demo with 5 sec holds      Shoulder Exercises: Sidelying   ABduction  Left;AROM;10 reps pt feels pulling in left trunk      Shoulder Exercises: Pulleys   Flexion  2 minutes pt returned therapist demo    ABduction  2 minutes pt returned therapist demo      Shoulder Exercises: Therapy Ball   Flexion  Both;10 reps with lean at top of stretch, pt returned therapist demo    ABduction  Left;10 reps with stretch at end range, pt returned therapist demo      Manual Therapy   Manual Therapy  Scapular mobilization    Scapular Mobilization  to left shoulder in right s/l in direction of protraction and retraction while moving arm through flexion- pt reports less pain with this    Passive ROM  to left shoulder in direction of flexion and abduction- pt requires frequent cueing to stay relaxed                  PT Long Term Goals - 06/20/18 1307      PT LONG TERM GOAL #1   Title  Patient will demonstrate she has returned to baseline related to left shoulder ROM and function post operatively.    Baseline  06/20/18- 143 flexion, 112 abduction      PT LONG TERM GOAL #2   Title  Patient will increase left shoulder active abduction ROM to >/= 160 degrees for increased ease reaching overhead.    Baseline  114 degrees, 06/20/18- 112 degrees    Time  6    Period  Weeks    Status  On-going      PT LONG TERM GOAL #3   Title  Patient will increase left shoulder active flexion ROM to >/= 140 degrees for increased ease reaching overhead.    Baseline  88 degrees,  06/20/18- 143    Time  6    Period  Weeks    Status  Achieved      PT LONG TERM GOAL #4   Title  Patient will report >/= 50%  improvement in pain and tenderness in her left breast.    Baseline  06/20/18- 3/10- pt reports at least 50% improvement    Time  6    Period  Weeks    Status  Achieved      PT LONG TERM GOAL #5   Title  Patient wil verbalize understanding of lymphedema risk reduction practices.    Baseline  06/20/18- pt able to independently verbalize this    Time  6    Period  Weeks    Status  Achieved      Breast Clinic Goals - 03/21/18 1527      Patient will be able to verbalize understanding of pertinent lymphedema risk reduction practices relevant to her diagnosis specifically related to skin care.   Time  1    Period  Days    Status  Achieved      Patient will be able to return demonstrate and/or verbalize understanding of the post-op home exercise program related to regaining shoulder range of motion.   Time  1    Period  Days    Status  Achieved      Patient will be able to verbalize understanding of the importance of attending the postoperative After Breast Cancer Class for further lymphedema risk reduction education and therapeutic exercise.   Time  1    Period  Days    Status  Achieved           Plan - 06/20/18 1336    Clinical Impression Statement  Assessed pt's progress towards goals in therapy. She has met her flexion ROM goal and her lymphedema goals. She continues to be limited in flexion and abduction. Focused on increasing this today. Began scapular mobilization since pt demonstrated shoulder hiking during supine PROM. Pt felt less pain with flexion during scapular moblization. Added AAROM exercises today and encouraged pt to do her home exercise program more often.     Rehab Potential  Excellent    Clinical Impairments Affecting Rehab Potential  None    PT Frequency  2x / week    PT Duration  8 weeks    PT Treatment/Interventions  ADLs/Self Care  Home Management;Therapeutic activities;Therapeutic exercise;Patient/family education;Manual techniques;Manual lymph drainage;Scar mobilization;Passive range of motion    PT Next Visit Plan  pulleys, ball, scap mobs to left shoulder, AA/A/PROM to left shoulder    PT Home Exercise Plan  post op shoulder ROM HEP    Consulted and Agree with Plan of Care  Patient       Patient will benefit from skilled therapeutic intervention in order to improve the following deficits and impairments:  Decreased knowledge of precautions, Impaired UE functional use, Decreased range of motion, Postural dysfunction, Decreased scar mobility, Decreased mobility, Pain, Increased fascial restricitons, Increased edema  Visit Diagnosis: Stiffness of left shoulder, not elsewhere classified  Abnormal posture  Pain in left arm     Problem List Patient Active Problem List   Diagnosis Date Noted  . Genetic testing 04/02/2018  . Family history of breast cancer   . Malignant neoplasm of upper-outer quadrant of left female breast ([place])   . Malignant neoplasm of upper-outer quadrant of left breast in female, estrogen receptor positive ([place]) 03/16/2018  . OA (osteoarthritis) of knee 09/11/2017  . Infrapatellar bursitis of right knee 04/29/2014  . Pneumonia, aspiration ([place]) 12/28/2012  . HYPOTHYROIDISM 11/15/2010  . HYPERLIPIDEMIA 11/15/2010  . DEPRESSION 11/15/2010  . GERD 11/15/2010  . CONSTIPATION 11/15/2010  . GALLSTONES 11/15/2010  .  RENAL CYST 11/15/2010  . INSOMNIA UNSPECIFIED 11/15/2010  . COUGH 11/15/2010  . ABDOMINAL PAIN OTHER SPECIFIED SITE 11/15/2010  . TRANSAMINASES, SERUM, ELEVATED 11/15/2010    Allyson Sabal [facility] 06/20/2018, 1:46 PM  [place], [place], [address] Phone: [phone]   Fax:  [phone]  Name: KASHAWN MANZANO MRN: [id number] Date of Birth: 04/22/1946  Manus Gunning, PT 06/20/18 1:46  PM

## 2018-06-21 DIAGNOSIS — M25661 Stiffness of right knee, not elsewhere classified: Secondary | ICD-10-CM | POA: Diagnosis not present

## 2018-06-21 DIAGNOSIS — G5782 Other specified mononeuropathies of left lower limb: Secondary | ICD-10-CM | POA: Diagnosis not present

## 2018-06-21 DIAGNOSIS — R262 Difficulty in walking, not elsewhere classified: Secondary | ICD-10-CM | POA: Diagnosis not present

## 2018-06-21 DIAGNOSIS — R531 Weakness: Secondary | ICD-10-CM | POA: Diagnosis not present

## 2018-06-22 DIAGNOSIS — B079 Viral wart, unspecified: Secondary | ICD-10-CM | POA: Diagnosis not present

## 2018-06-25 ENCOUNTER — Ambulatory Visit: Payer: Medicare Other | Admitting: Rehabilitation

## 2018-06-25 ENCOUNTER — Encounter: Payer: Self-pay | Admitting: Rehabilitation

## 2018-06-25 DIAGNOSIS — M25612 Stiffness of left shoulder, not elsewhere classified: Secondary | ICD-10-CM | POA: Diagnosis not present

## 2018-06-25 DIAGNOSIS — R293 Abnormal posture: Secondary | ICD-10-CM

## 2018-06-25 DIAGNOSIS — R600 Localized edema: Secondary | ICD-10-CM | POA: Diagnosis not present

## 2018-06-25 DIAGNOSIS — C50412 Malignant neoplasm of upper-outer quadrant of left female breast: Secondary | ICD-10-CM | POA: Diagnosis not present

## 2018-06-25 DIAGNOSIS — M79602 Pain in left arm: Secondary | ICD-10-CM

## 2018-06-25 DIAGNOSIS — R262 Difficulty in walking, not elsewhere classified: Secondary | ICD-10-CM | POA: Diagnosis not present

## 2018-06-25 DIAGNOSIS — M25661 Stiffness of right knee, not elsewhere classified: Secondary | ICD-10-CM | POA: Diagnosis not present

## 2018-06-25 DIAGNOSIS — Z170 Estrogen receptor positive status [ER+]: Secondary | ICD-10-CM | POA: Diagnosis not present

## 2018-06-25 DIAGNOSIS — G5782 Other specified mononeuropathies of left lower limb: Secondary | ICD-10-CM | POA: Diagnosis not present

## 2018-06-25 DIAGNOSIS — R531 Weakness: Secondary | ICD-10-CM | POA: Diagnosis not present

## 2018-06-25 NOTE — Therapy (Signed)
[place], [place], [address] Phone: [phone]   Fax:  [phone]  Physical Therapy Treatment  Patient Details  Name: [name] MRN: [id number] Date of Birth: 1946-07-09 Referring Provider: Dr. Rolm Bookbinder   Encounter Date: 06/25/2018  PT End of Session - 06/25/18 1352    Visit Number  17    Number of Visits  17    Date for PT Re-Evaluation  07/05/18    Authorization Type  medicare/tricare    PT Start Time  1302    PT Stop Time  1340    PT Time Calculation (min)  38 min    Activity Tolerance  Patient tolerated treatment well    Behavior During Therapy  [facility] for tasks assessed/performed       Past Medical History:  Diagnosis Date  . Arthritis    hands and knees  . Complication of anesthesia    aspiration pna; following a colonoscopy, pt requests head of bed elevated if possible.  . Constipation   . Cough   . Depression   . Dysrhythmia    RBBB on 06-06-17 ekg   . Elevated liver function tests   . Esophageal stricture   . Family history of breast cancer   . Gallstones   . Genetic testing 04/02/2018   STAT Breast panel with reflex to Multi-Cancer panel (83 genes) @ [facility] - No pathogenic mutations detected  . GERD (gastroesophageal reflux disease)   . Hiatal hernia   . History of kidney stones   . Hyperlipemia   . Hypothyroidism   . Insomnia   . Malignant neoplasm of upper-outer quadrant of left female breast ([place])   . Nephrolithiasis   . Pneumonia 2015   aspirated after colonosopy  . Renal cyst     Past Surgical History:  Procedure Laterality Date  . BREAST LUMPECTOMY WITH RADIOACTIVE SEED AND SENTINEL LYMPH NODE BIOPSY Left 04/17/2018   Procedure: LEFT BREAST LUMPECTOMY WITH BRACKETED RADIOACTIVE SEEDS AND SENTINEL LYMPH NODE BIOPSY;  Surgeon: Rolm Bookbinder, MD;  Location: [facility];  Service: General;  Laterality: Left;  . BREAST SURGERY Left    Lumpectomy  . CARDIOVASCULAR STRESS TEST  07/19/2006   EF 70%, NO EVIDENCE OF ISCHEMIA  . CHOLECYSTECTOMY  12/30/10  . COLONOSCOPY    . DILATION AND CURETTAGE OF UTERUS    . HYSTEROSCOPY W/D&C N/A 06/07/2018   Procedure: DILATATION AND CURETTAGE /HYSTEROSCOPY;  Surgeon: [name], MD;  Location: [facility];  Service: Gynecology;  Laterality: N/A;  . inguinal herniography    . NASAL SINUS SURGERY    . RADIOACTIVE SEED GUIDED EXCISIONAL BREAST BIOPSY Right 04/17/2018   Procedure: RIGHT BREAST SEED GUIDED EXCISIONAL BIOPSY;  Surgeon: Rolm Bookbinder, MD;  Location: [facility];  Service: General;  Laterality: Right;  . RE-EXCISION OF BREAST LUMPECTOMY Left 05/15/2018   Procedure: RE-EXCISION OF LEFT BREAST LUMPECTOMY, ASPIRATION OF LEFT AXILLARY SEROMA;  Surgeon: Rolm Bookbinder, MD;  Location: [facility];  Service: General;  Laterality: Left;  . T & A    . TOOTH EXTRACTION    . TOTAL KNEE ARTHROPLASTY Left 09/11/2017   Procedure: LEFT TOTAL KNEE ARTHROPLASTY;  Surgeon: Gaynelle Arabian, MD;  Location: [facility];  Service: Orthopedics;  Laterality: Left;  with block  . US ECHOCARDIOGRAPHY  07/17/2006   EF 55-60%    There were no vitals filed for this visit.  Subjective Assessment - 06/25/18 1304    Subjective  Not sure if I should be done.  Starting radiation 7/29.  In terms of the swelling I feel ok.  Shoulder giving her no daily activity limitations.  The only pain is in the armpit with reaching.      Pertinent History  Patient was diagnosed on 03/08/18 with left grade 1-2 invasive ductal carcinoma breast cancer.It measured 1.5 cm and is located in the upper outer quadrant. It is ER/PR positive and HER2 negative with a Ki67 of < 1%.  She had a left total knee replacement in 10/18 with Dr. Wynelle Link and continues to have pain in her left lateral knee. Her orthopedist reported it is likely due to her iliotibial band. Patient reports she underwent a left lumpectomy and SNLB  (0/4 nodes positive) removed on 04/17/18.    Currently in Pain?  No/denies         [facility] PT Assessment - 06/25/18 0001      AROM   Overall AROM Comments  all WNL with some slight pull with overhead flexion and abduction      Strength   Overall Strength  Within functional limits for tasks performed all 5/5 without pain                   [facility] Adult PT Treatment/Exercise - 06/25/18 0001      Exercises   Exercises  Other Exercises    Other Exercises   reviewed strength ABC packet with discussion only or PT demonstration no questions      Manual Therapy   Passive ROM  to Lt shoulder all directions, flexion, abduction, and ER focus             PT Education - 06/25/18 1343    Education provided  Yes    Education Details  strength ABC program with discussion only    Person(s) Educated  Patient    Methods  Explanation    Comprehension  Verbalized understanding          PT Long Term Goals - 06/25/18 1309      PT LONG TERM GOAL #1   Title  Patient will demonstrate she has returned to baseline related to left shoulder ROM and function post operatively.    Status  Achieved      PT LONG TERM GOAL #2   Title  Patient will increase left shoulder active abduction ROM to >/= 160 degrees for increased ease reaching overhead.    Status  Achieved      PT LONG TERM GOAL #3   Title  Patient will increase left shoulder active flexion ROM to >/= 140 degrees for increased ease reaching overhead.    Status  Achieved      PT LONG TERM GOAL #4   Status  Achieved      PT LONG TERM GOAL #5   Title  Patient wil verbalize understanding of lymphedema risk reduction practices.    Status  Achieved      Breast Clinic Goals - 03/21/18 1527      Patient will be able to verbalize understanding of pertinent lymphedema risk reduction practices relevant to her diagnosis specifically related to skin care.   Time  1    Period  Days    Status  Achieved      Patient will be able  to return demonstrate and/or verbalize understanding of the post-op home exercise program related to regaining shoulder range of motion.   Time  1    Period  Days  Status  Achieved      Patient will be able to verbalize understanding of the importance of attending the postoperative After Breast Cancer Class for further lymphedema risk reduction education and therapeutic exercise.   Time  1    Period  Days    Status  Achieved           Plan - 06/25/18 1354    Clinical Impression Statement  Pt feels ok with Dc today with independence in HEP and self care now starting radiation.  All goals met.  Continues with only end range pulling in the axilla with flexion and abduction, improved with stretching by pt and PT.      Consulted and Agree with Plan of Care  Patient       Patient will benefit from skilled therapeutic intervention in order to improve the following deficits and impairments:     Visit Diagnosis: Stiffness of left shoulder, not elsewhere classified  Abnormal posture  Pain in left arm     Problem List Patient Active Problem List   Diagnosis Date Noted  . Genetic testing 04/02/2018  . Family history of breast cancer   . Malignant neoplasm of upper-outer quadrant of left female breast ([place])   . Malignant neoplasm of upper-outer quadrant of left breast in female, estrogen receptor positive ([place]) 03/16/2018  . OA (osteoarthritis) of knee 09/11/2017  . Infrapatellar bursitis of right knee 04/29/2014  . Pneumonia, aspiration ([place]) 12/28/2012  . HYPOTHYROIDISM 11/15/2010  . HYPERLIPIDEMIA 11/15/2010  . DEPRESSION 11/15/2010  . GERD 11/15/2010  . CONSTIPATION 11/15/2010  . GALLSTONES 11/15/2010  . RENAL CYST 11/15/2010  . INSOMNIA UNSPECIFIED 11/15/2010  . COUGH 11/15/2010  . ABDOMINAL PAIN OTHER SPECIFIED SITE 11/15/2010  . TRANSAMINASES, SERUM, ELEVATED 11/15/2010    Shan Levans, PT 06/25/2018, 1:57 PM  [place], [place], [address] Phone: [phone]   Fax:  [phone]  Name: MAEVE DEBORD MRN: [id number] Date of Birth: 1946-03-07  PHYSICAL THERAPY DISCHARGE SUMMARY  Visits from Start of Care: 17  Current functional level related to goals / functional outcomes: See above   Remaining deficits: See above   Education / Equipment: Shoulder stretches, strength ABC program, compression prescription Plan: Patient agrees to discharge.  Patient goals were met. Patient is being discharged due to meeting the stated rehab goals.  ?????

## 2018-06-25 NOTE — Patient Instructions (Signed)
Strength ABC program

## 2018-06-27 ENCOUNTER — Ambulatory Visit: Payer: Medicare Other | Admitting: Rehabilitation

## 2018-06-27 DIAGNOSIS — G5782 Other specified mononeuropathies of left lower limb: Secondary | ICD-10-CM | POA: Diagnosis not present

## 2018-06-27 DIAGNOSIS — R262 Difficulty in walking, not elsewhere classified: Secondary | ICD-10-CM | POA: Diagnosis not present

## 2018-06-27 DIAGNOSIS — R531 Weakness: Secondary | ICD-10-CM | POA: Diagnosis not present

## 2018-06-27 DIAGNOSIS — M25661 Stiffness of right knee, not elsewhere classified: Secondary | ICD-10-CM | POA: Diagnosis not present

## 2018-06-28 DIAGNOSIS — G5782 Other specified mononeuropathies of left lower limb: Secondary | ICD-10-CM | POA: Diagnosis not present

## 2018-06-28 DIAGNOSIS — M25661 Stiffness of right knee, not elsewhere classified: Secondary | ICD-10-CM | POA: Diagnosis not present

## 2018-06-28 DIAGNOSIS — R531 Weakness: Secondary | ICD-10-CM | POA: Diagnosis not present

## 2018-06-28 DIAGNOSIS — R262 Difficulty in walking, not elsewhere classified: Secondary | ICD-10-CM | POA: Diagnosis not present

## 2018-06-29 ENCOUNTER — Ambulatory Visit
Admission: RE | Admit: 2018-06-29 | Discharge: 2018-06-29 | Disposition: A | Discharge status: Home / Self Care | Payer: Medicare Other | Source: Ambulatory Visit | Attending: Radiation Oncology | Admitting: Radiation Oncology

## 2018-06-29 DIAGNOSIS — C50412 Malignant neoplasm of upper-outer quadrant of left female breast: Secondary | ICD-10-CM | POA: Diagnosis not present

## 2018-06-29 DIAGNOSIS — Z510 Encounter for antineoplastic radiation therapy: Secondary | ICD-10-CM | POA: Insufficient documentation

## 2018-06-29 DIAGNOSIS — Z170 Estrogen receptor positive status [ER+]: Secondary | ICD-10-CM | POA: Insufficient documentation

## 2018-06-29 NOTE — Progress Notes (Signed)
Radiation Oncology         [phone] ________________________________  Name: [name] MRN: [id number]  Date: 06/29/2018  DOB: 01-23-1946  SIMULATION AND TREATMENT PLANNING NOTE   Special treatment procedure  Outpatient  DIAGNOSIS:     ICD-10-CM   1. Malignant neoplasm of upper-outer quadrant of left breast in female, estrogen receptor positive ([place]) C50.412    Z17.0     NARRATIVE:  The patient was brought to the [facility].  Identity was confirmed.  All relevant records and images related to the planned course of therapy were reviewed.  The patient freely provided informed written consent to proceed with treatment after reviewing the details related to the planned course of therapy. The consent form was witnessed and verified by the simulation staff.    Then, the patient was set-up in a stable reproducible supine position for radiation therapy with her ipsilateral arm over her head, and her upper body secured in a custom-made Vac-lok device.  CT images were obtained.  Surface markings were placed.  The CT images were loaded into the planning software.    Special treatment procedure  was performed today due to the extra time and effort required by myself to plan and prepare this patient for deep inspiration breath hold technique.  I have determined cardiac sparing to be of benefit to this patient to prevent long term cardiac damage due to radiation of the heart.  Bellows were placed on the patient's abdomen. To facilitate cardiac sparing, the patient was coached by the radiation therapists on breath hold techniques and breathing practice was performed. Practice waveforms were obtained. The patient was then scanned while maintaining breath hold in the treatment position.  This image was then transferred over to the imaging specialist. The imaging specialist then created a fusion of the free breathing and breath hold scans using the chest wall as the stable structure. I  personally reviewed the fusion in axial, coronal and sagittal image planes.  Excellent cardiac sparing was obtained.  I felt the patient is an appropriate candidate for breath hold and the patient will be treated as such.  The image fusion was then reviewed with the patient to reinforce the necessity of reproducible breath hold.   TREATMENT PLANNING NOTE: Treatment planning then occurred.  The radiation prescription was entered and confirmed.     A total of 3 medically necessary complex treatment devices were fabricated and supervised by me: 2 fields with MLCs for custom blocks to protect heart, and lungs;  and, a Vac-lok. MORE COMPLEX DEVICES MAY BE MADE IN DOSIMETRY FOR FIELD IN FIELD BEAMS FOR DOSE HOMOGENEITY.  I have requested : 3D Simulation which is medically necessary to give adequate dose to at risk tissues while sparing lungs and heart.  I have requested a DVH of the following structures: lungs, heart, left lumpectomy cavity.    The patient will receive 40.05 Gy in 15 fractions to the left breast with 2 tangential fields.  This will be followed by a boost.  Optical Surface Tracking Plan:  Since intensity modulated radiotherapy (IMRT) and 3D conformal radiation treatment methods are predicated on accurate and precise positioning for treatment, intrafraction motion monitoring is medically necessary to ensure accurate and safe treatment delivery. The ability to quantify intrafraction motion without excessive ionizing radiation dose can only be performed with optical surface tracking. Accordingly, surface imaging offers the opportunity to obtain 3D measurements of patient position throughout IMRT and 3D treatments without excessive radiation exposure.  I am ordering optical surface tracking for this patient's upcoming course of radiotherapy.  ________________________________   Reference:  Ursula Alert, J, et al. Surface imaging-based analysis of intrafraction motion for breast  radiotherapy patients.Journal of [place], n. 6, nov. 2014. ISSN [id number].  Available at: <http://www.jacmp.org/index.php/jacmp/article/view/4957>.    -----------------------------------  Eppie Gibson, MD

## 2018-07-03 DIAGNOSIS — C50412 Malignant neoplasm of upper-outer quadrant of left female breast: Secondary | ICD-10-CM | POA: Diagnosis not present

## 2018-07-03 DIAGNOSIS — Z170 Estrogen receptor positive status [ER+]: Secondary | ICD-10-CM | POA: Diagnosis not present

## 2018-07-03 DIAGNOSIS — Z510 Encounter for antineoplastic radiation therapy: Secondary | ICD-10-CM | POA: Diagnosis not present

## 2018-07-09 ENCOUNTER — Ambulatory Visit
Admission: RE | Admit: 2018-07-09 | Discharge: 2018-07-09 | Disposition: A | Discharge status: Home / Self Care | Payer: Medicare Other | Source: Ambulatory Visit | Attending: Radiation Oncology | Admitting: Radiation Oncology

## 2018-07-09 ENCOUNTER — Ambulatory Visit: Payer: Medicare Other | Admitting: Radiation Oncology

## 2018-07-09 DIAGNOSIS — R531 Weakness: Secondary | ICD-10-CM | POA: Diagnosis not present

## 2018-07-09 DIAGNOSIS — M25661 Stiffness of right knee, not elsewhere classified: Secondary | ICD-10-CM | POA: Diagnosis not present

## 2018-07-09 DIAGNOSIS — G5782 Other specified mononeuropathies of left lower limb: Secondary | ICD-10-CM | POA: Diagnosis not present

## 2018-07-09 DIAGNOSIS — R262 Difficulty in walking, not elsewhere classified: Secondary | ICD-10-CM | POA: Diagnosis not present

## 2018-07-09 DIAGNOSIS — C50412 Malignant neoplasm of upper-outer quadrant of left female breast: Secondary | ICD-10-CM | POA: Diagnosis not present

## 2018-07-09 DIAGNOSIS — Z170 Estrogen receptor positive status [ER+]: Principal | ICD-10-CM

## 2018-07-09 DIAGNOSIS — Z510 Encounter for antineoplastic radiation therapy: Secondary | ICD-10-CM | POA: Diagnosis not present

## 2018-07-09 MED ORDER — RADIAPLEXRX EX GEL
Freq: Once | CUTANEOUS | Status: AC
  Administered 2018-07-09: 15:00:00 via TOPICAL

## 2018-07-09 MED ORDER — ALRA NON-METALLIC DEODORANT (RAD-ONC)
1.0000 "application " | Freq: Once | TOPICAL | Status: AC
  Administered 2018-07-09: 1 via TOPICAL

## 2018-07-09 NOTE — Progress Notes (Signed)

## 2018-07-10 ENCOUNTER — Ambulatory Visit: Payer: Medicare Other

## 2018-07-10 ENCOUNTER — Ambulatory Visit
Admission: RE | Admit: 2018-07-10 | Discharge: 2018-07-10 | Disposition: A | Discharge status: Home / Self Care | Payer: Medicare Other | Source: Ambulatory Visit | Attending: Radiation Oncology | Admitting: Radiation Oncology

## 2018-07-10 DIAGNOSIS — Z170 Estrogen receptor positive status [ER+]: Secondary | ICD-10-CM | POA: Diagnosis not present

## 2018-07-10 DIAGNOSIS — C50412 Malignant neoplasm of upper-outer quadrant of left female breast: Secondary | ICD-10-CM | POA: Diagnosis not present

## 2018-07-10 DIAGNOSIS — Z510 Encounter for antineoplastic radiation therapy: Secondary | ICD-10-CM | POA: Diagnosis not present

## 2018-07-11 ENCOUNTER — Ambulatory Visit: Payer: Medicare Other

## 2018-07-11 ENCOUNTER — Ambulatory Visit
Admission: RE | Admit: 2018-07-11 | Discharge: 2018-07-11 | Disposition: A | Discharge status: Home / Self Care | Payer: Medicare Other | Source: Ambulatory Visit | Attending: Radiation Oncology | Admitting: Radiation Oncology

## 2018-07-11 DIAGNOSIS — Z510 Encounter for antineoplastic radiation therapy: Secondary | ICD-10-CM | POA: Diagnosis not present

## 2018-07-11 DIAGNOSIS — C50412 Malignant neoplasm of upper-outer quadrant of left female breast: Secondary | ICD-10-CM | POA: Diagnosis not present

## 2018-07-11 DIAGNOSIS — Z170 Estrogen receptor positive status [ER+]: Secondary | ICD-10-CM | POA: Diagnosis not present

## 2018-07-12 ENCOUNTER — Ambulatory Visit
Admission: RE | Admit: 2018-07-12 | Discharge: 2018-07-12 | Disposition: A | Discharge status: Home / Self Care | Payer: Medicare Other | Source: Ambulatory Visit | Attending: Radiation Oncology | Admitting: Radiation Oncology

## 2018-07-12 ENCOUNTER — Ambulatory Visit: Payer: Medicare Other

## 2018-07-12 DIAGNOSIS — C50412 Malignant neoplasm of upper-outer quadrant of left female breast: Secondary | ICD-10-CM | POA: Insufficient documentation

## 2018-07-12 DIAGNOSIS — Z510 Encounter for antineoplastic radiation therapy: Secondary | ICD-10-CM | POA: Insufficient documentation

## 2018-07-12 DIAGNOSIS — Z170 Estrogen receptor positive status [ER+]: Secondary | ICD-10-CM | POA: Insufficient documentation

## 2018-07-12 DIAGNOSIS — M1712 Unilateral primary osteoarthritis, left knee: Secondary | ICD-10-CM | POA: Diagnosis not present

## 2018-07-13 ENCOUNTER — Ambulatory Visit: Payer: Medicare Other

## 2018-07-13 ENCOUNTER — Ambulatory Visit
Admission: RE | Admit: 2018-07-13 | Discharge: 2018-07-13 | Disposition: A | Discharge status: Home / Self Care | Payer: Medicare Other | Source: Ambulatory Visit | Attending: Radiation Oncology | Admitting: Radiation Oncology

## 2018-07-13 DIAGNOSIS — Z170 Estrogen receptor positive status [ER+]: Secondary | ICD-10-CM | POA: Diagnosis not present

## 2018-07-13 DIAGNOSIS — M25661 Stiffness of right knee, not elsewhere classified: Secondary | ICD-10-CM | POA: Diagnosis not present

## 2018-07-13 DIAGNOSIS — Z510 Encounter for antineoplastic radiation therapy: Secondary | ICD-10-CM | POA: Diagnosis not present

## 2018-07-13 DIAGNOSIS — R262 Difficulty in walking, not elsewhere classified: Secondary | ICD-10-CM | POA: Diagnosis not present

## 2018-07-13 DIAGNOSIS — R531 Weakness: Secondary | ICD-10-CM | POA: Diagnosis not present

## 2018-07-13 DIAGNOSIS — G5782 Other specified mononeuropathies of left lower limb: Secondary | ICD-10-CM | POA: Diagnosis not present

## 2018-07-13 DIAGNOSIS — C50412 Malignant neoplasm of upper-outer quadrant of left female breast: Secondary | ICD-10-CM | POA: Diagnosis not present

## 2018-07-16 ENCOUNTER — Ambulatory Visit
Admission: RE | Admit: 2018-07-16 | Discharge: 2018-07-16 | Disposition: A | Discharge status: Home / Self Care | Payer: Medicare Other | Source: Ambulatory Visit | Attending: Radiation Oncology | Admitting: Radiation Oncology

## 2018-07-16 ENCOUNTER — Ambulatory Visit: Payer: Medicare Other

## 2018-07-16 DIAGNOSIS — C50412 Malignant neoplasm of upper-outer quadrant of left female breast: Secondary | ICD-10-CM | POA: Diagnosis not present

## 2018-07-16 DIAGNOSIS — R531 Weakness: Secondary | ICD-10-CM | POA: Diagnosis not present

## 2018-07-16 DIAGNOSIS — G5782 Other specified mononeuropathies of left lower limb: Secondary | ICD-10-CM | POA: Diagnosis not present

## 2018-07-16 DIAGNOSIS — M25661 Stiffness of right knee, not elsewhere classified: Secondary | ICD-10-CM | POA: Diagnosis not present

## 2018-07-16 DIAGNOSIS — Z510 Encounter for antineoplastic radiation therapy: Secondary | ICD-10-CM | POA: Diagnosis not present

## 2018-07-16 DIAGNOSIS — R262 Difficulty in walking, not elsewhere classified: Secondary | ICD-10-CM | POA: Diagnosis not present

## 2018-07-16 DIAGNOSIS — Z170 Estrogen receptor positive status [ER+]: Secondary | ICD-10-CM | POA: Diagnosis not present

## 2018-07-17 ENCOUNTER — Ambulatory Visit: Payer: Medicare Other

## 2018-07-17 ENCOUNTER — Ambulatory Visit
Admission: RE | Admit: 2018-07-17 | Discharge: 2018-07-17 | Disposition: A | Discharge status: Home / Self Care | Payer: Medicare Other | Source: Ambulatory Visit | Attending: Radiation Oncology | Admitting: Radiation Oncology

## 2018-07-17 DIAGNOSIS — Z170 Estrogen receptor positive status [ER+]: Secondary | ICD-10-CM | POA: Diagnosis not present

## 2018-07-17 DIAGNOSIS — C50412 Malignant neoplasm of upper-outer quadrant of left female breast: Secondary | ICD-10-CM | POA: Diagnosis not present

## 2018-07-17 DIAGNOSIS — Z510 Encounter for antineoplastic radiation therapy: Secondary | ICD-10-CM | POA: Diagnosis not present

## 2018-07-18 ENCOUNTER — Ambulatory Visit: Payer: Medicare Other

## 2018-07-18 ENCOUNTER — Ambulatory Visit
Admission: RE | Admit: 2018-07-18 | Discharge: 2018-07-18 | Disposition: A | Discharge status: Home / Self Care | Payer: Medicare Other | Source: Ambulatory Visit | Attending: Radiation Oncology | Admitting: Radiation Oncology

## 2018-07-18 DIAGNOSIS — Z510 Encounter for antineoplastic radiation therapy: Secondary | ICD-10-CM | POA: Diagnosis not present

## 2018-07-18 DIAGNOSIS — C50412 Malignant neoplasm of upper-outer quadrant of left female breast: Secondary | ICD-10-CM | POA: Diagnosis not present

## 2018-07-18 DIAGNOSIS — Z170 Estrogen receptor positive status [ER+]: Secondary | ICD-10-CM | POA: Diagnosis not present

## 2018-07-19 ENCOUNTER — Ambulatory Visit
Admission: RE | Admit: 2018-07-19 | Discharge: 2018-07-19 | Disposition: A | Discharge status: Home / Self Care | Payer: Medicare Other | Source: Ambulatory Visit | Attending: Radiation Oncology | Admitting: Radiation Oncology

## 2018-07-19 ENCOUNTER — Ambulatory Visit: Payer: Medicare Other

## 2018-07-19 DIAGNOSIS — R531 Weakness: Secondary | ICD-10-CM | POA: Diagnosis not present

## 2018-07-19 DIAGNOSIS — G5782 Other specified mononeuropathies of left lower limb: Secondary | ICD-10-CM | POA: Diagnosis not present

## 2018-07-19 DIAGNOSIS — Z510 Encounter for antineoplastic radiation therapy: Secondary | ICD-10-CM | POA: Diagnosis not present

## 2018-07-19 DIAGNOSIS — M25661 Stiffness of right knee, not elsewhere classified: Secondary | ICD-10-CM | POA: Diagnosis not present

## 2018-07-19 DIAGNOSIS — Z170 Estrogen receptor positive status [ER+]: Secondary | ICD-10-CM | POA: Diagnosis not present

## 2018-07-19 DIAGNOSIS — R262 Difficulty in walking, not elsewhere classified: Secondary | ICD-10-CM | POA: Diagnosis not present

## 2018-07-19 DIAGNOSIS — C50412 Malignant neoplasm of upper-outer quadrant of left female breast: Secondary | ICD-10-CM | POA: Diagnosis not present

## 2018-07-20 ENCOUNTER — Ambulatory Visit: Payer: Medicare Other

## 2018-07-20 ENCOUNTER — Ambulatory Visit
Admission: RE | Admit: 2018-07-20 | Discharge: 2018-07-20 | Disposition: A | Discharge status: Home / Self Care | Payer: Medicare Other | Source: Ambulatory Visit | Attending: Radiation Oncology | Admitting: Radiation Oncology

## 2018-07-20 DIAGNOSIS — Z510 Encounter for antineoplastic radiation therapy: Secondary | ICD-10-CM | POA: Diagnosis not present

## 2018-07-20 DIAGNOSIS — Z170 Estrogen receptor positive status [ER+]: Secondary | ICD-10-CM | POA: Diagnosis not present

## 2018-07-20 DIAGNOSIS — C50412 Malignant neoplasm of upper-outer quadrant of left female breast: Secondary | ICD-10-CM | POA: Diagnosis not present

## 2018-07-23 ENCOUNTER — Ambulatory Visit
Admission: RE | Admit: 2018-07-23 | Discharge: 2018-07-23 | Disposition: A | Discharge status: Home / Self Care | Payer: Medicare Other | Source: Ambulatory Visit | Attending: Radiation Oncology | Admitting: Radiation Oncology

## 2018-07-23 ENCOUNTER — Ambulatory Visit: Payer: Medicare Other

## 2018-07-23 ENCOUNTER — Ambulatory Visit: Payer: Medicare Other | Admitting: Radiation Oncology

## 2018-07-23 DIAGNOSIS — Z510 Encounter for antineoplastic radiation therapy: Secondary | ICD-10-CM | POA: Diagnosis not present

## 2018-07-23 DIAGNOSIS — C50412 Malignant neoplasm of upper-outer quadrant of left female breast: Secondary | ICD-10-CM | POA: Diagnosis not present

## 2018-07-23 DIAGNOSIS — Z170 Estrogen receptor positive status [ER+]: Secondary | ICD-10-CM | POA: Diagnosis not present

## 2018-07-24 ENCOUNTER — Ambulatory Visit
Admission: RE | Admit: 2018-07-24 | Discharge: 2018-07-24 | Disposition: A | Discharge status: Home / Self Care | Payer: Medicare Other | Source: Ambulatory Visit | Attending: Radiation Oncology | Admitting: Radiation Oncology

## 2018-07-24 ENCOUNTER — Ambulatory Visit: Payer: Medicare Other

## 2018-07-24 DIAGNOSIS — C50412 Malignant neoplasm of upper-outer quadrant of left female breast: Secondary | ICD-10-CM | POA: Diagnosis not present

## 2018-07-24 DIAGNOSIS — Z510 Encounter for antineoplastic radiation therapy: Secondary | ICD-10-CM | POA: Diagnosis not present

## 2018-07-24 DIAGNOSIS — Z170 Estrogen receptor positive status [ER+]: Secondary | ICD-10-CM | POA: Diagnosis not present

## 2018-07-25 ENCOUNTER — Ambulatory Visit
Admission: RE | Admit: 2018-07-25 | Discharge: 2018-07-25 | Disposition: A | Discharge status: Home / Self Care | Payer: Medicare Other | Source: Ambulatory Visit | Attending: Radiation Oncology | Admitting: Radiation Oncology

## 2018-07-25 ENCOUNTER — Ambulatory Visit: Payer: Medicare Other

## 2018-07-25 DIAGNOSIS — M25661 Stiffness of right knee, not elsewhere classified: Secondary | ICD-10-CM | POA: Diagnosis not present

## 2018-07-25 DIAGNOSIS — Z510 Encounter for antineoplastic radiation therapy: Secondary | ICD-10-CM | POA: Diagnosis not present

## 2018-07-25 DIAGNOSIS — G5782 Other specified mononeuropathies of left lower limb: Secondary | ICD-10-CM | POA: Diagnosis not present

## 2018-07-25 DIAGNOSIS — Z170 Estrogen receptor positive status [ER+]: Secondary | ICD-10-CM | POA: Diagnosis not present

## 2018-07-25 DIAGNOSIS — C50412 Malignant neoplasm of upper-outer quadrant of left female breast: Secondary | ICD-10-CM | POA: Diagnosis not present

## 2018-07-25 DIAGNOSIS — R531 Weakness: Secondary | ICD-10-CM | POA: Diagnosis not present

## 2018-07-25 DIAGNOSIS — R262 Difficulty in walking, not elsewhere classified: Secondary | ICD-10-CM | POA: Diagnosis not present

## 2018-07-25 MED ORDER — RADIAPLEXRX EX GEL
Freq: Once | CUTANEOUS | Status: AC
  Administered 2018-07-25: 14:00:00 via TOPICAL

## 2018-07-26 ENCOUNTER — Ambulatory Visit: Payer: Medicare Other

## 2018-07-26 ENCOUNTER — Ambulatory Visit
Admission: RE | Admit: 2018-07-26 | Discharge: 2018-07-26 | Disposition: A | Discharge status: Home / Self Care | Payer: Medicare Other | Source: Ambulatory Visit | Attending: Radiation Oncology | Admitting: Radiation Oncology

## 2018-07-26 DIAGNOSIS — Z170 Estrogen receptor positive status [ER+]: Secondary | ICD-10-CM | POA: Diagnosis not present

## 2018-07-26 DIAGNOSIS — C50412 Malignant neoplasm of upper-outer quadrant of left female breast: Secondary | ICD-10-CM | POA: Diagnosis not present

## 2018-07-26 DIAGNOSIS — Z510 Encounter for antineoplastic radiation therapy: Secondary | ICD-10-CM | POA: Diagnosis not present

## 2018-07-27 ENCOUNTER — Ambulatory Visit: Payer: Medicare Other

## 2018-07-27 ENCOUNTER — Ambulatory Visit
Admission: RE | Admit: 2018-07-27 | Discharge: 2018-07-27 | Disposition: A | Discharge status: Home / Self Care | Payer: Medicare Other | Source: Ambulatory Visit | Attending: Radiation Oncology | Admitting: Radiation Oncology

## 2018-07-27 DIAGNOSIS — Z510 Encounter for antineoplastic radiation therapy: Secondary | ICD-10-CM | POA: Diagnosis not present

## 2018-07-27 DIAGNOSIS — C50412 Malignant neoplasm of upper-outer quadrant of left female breast: Secondary | ICD-10-CM | POA: Diagnosis not present

## 2018-07-27 DIAGNOSIS — Z170 Estrogen receptor positive status [ER+]: Secondary | ICD-10-CM | POA: Diagnosis not present

## 2018-07-29 ENCOUNTER — Ambulatory Visit: Admission: RE | Admit: 2018-07-29 | Payer: Medicare Other | Source: Ambulatory Visit

## 2018-07-30 ENCOUNTER — Ambulatory Visit: Payer: Medicare Other

## 2018-07-30 ENCOUNTER — Ambulatory Visit
Admission: RE | Admit: 2018-07-30 | Discharge: 2018-07-30 | Disposition: A | Discharge status: Home / Self Care | Payer: Medicare Other | Source: Ambulatory Visit | Attending: Radiation Oncology | Admitting: Radiation Oncology

## 2018-07-30 DIAGNOSIS — R262 Difficulty in walking, not elsewhere classified: Secondary | ICD-10-CM | POA: Diagnosis not present

## 2018-07-30 DIAGNOSIS — M25661 Stiffness of right knee, not elsewhere classified: Secondary | ICD-10-CM | POA: Diagnosis not present

## 2018-07-30 DIAGNOSIS — R531 Weakness: Secondary | ICD-10-CM | POA: Diagnosis not present

## 2018-07-30 DIAGNOSIS — G5782 Other specified mononeuropathies of left lower limb: Secondary | ICD-10-CM | POA: Diagnosis not present

## 2018-07-30 DIAGNOSIS — Z170 Estrogen receptor positive status [ER+]: Secondary | ICD-10-CM | POA: Diagnosis not present

## 2018-07-30 DIAGNOSIS — Z510 Encounter for antineoplastic radiation therapy: Secondary | ICD-10-CM | POA: Diagnosis not present

## 2018-07-30 DIAGNOSIS — C50412 Malignant neoplasm of upper-outer quadrant of left female breast: Secondary | ICD-10-CM | POA: Diagnosis not present

## 2018-07-31 ENCOUNTER — Ambulatory Visit: Payer: Medicare Other

## 2018-07-31 ENCOUNTER — Ambulatory Visit
Admission: RE | Admit: 2018-07-31 | Discharge: 2018-07-31 | Disposition: A | Discharge status: Home / Self Care | Payer: Medicare Other | Source: Ambulatory Visit | Attending: Radiation Oncology | Admitting: Radiation Oncology

## 2018-07-31 DIAGNOSIS — Z510 Encounter for antineoplastic radiation therapy: Secondary | ICD-10-CM | POA: Diagnosis not present

## 2018-07-31 DIAGNOSIS — Z170 Estrogen receptor positive status [ER+]: Secondary | ICD-10-CM | POA: Diagnosis not present

## 2018-07-31 DIAGNOSIS — C50412 Malignant neoplasm of upper-outer quadrant of left female breast: Secondary | ICD-10-CM | POA: Diagnosis not present

## 2018-08-01 ENCOUNTER — Ambulatory Visit: Payer: Medicare Other

## 2018-08-01 ENCOUNTER — Ambulatory Visit
Admission: RE | Admit: 2018-08-01 | Discharge: 2018-08-01 | Disposition: A | Discharge status: Home / Self Care | Payer: Medicare Other | Source: Ambulatory Visit | Attending: Radiation Oncology | Admitting: Radiation Oncology

## 2018-08-01 DIAGNOSIS — Z510 Encounter for antineoplastic radiation therapy: Secondary | ICD-10-CM | POA: Diagnosis not present

## 2018-08-01 DIAGNOSIS — Z170 Estrogen receptor positive status [ER+]: Secondary | ICD-10-CM | POA: Diagnosis not present

## 2018-08-01 DIAGNOSIS — C50412 Malignant neoplasm of upper-outer quadrant of left female breast: Secondary | ICD-10-CM | POA: Diagnosis not present

## 2018-08-02 ENCOUNTER — Ambulatory Visit
Admission: RE | Admit: 2018-08-02 | Discharge: 2018-08-02 | Disposition: A | Discharge status: Home / Self Care | Payer: Medicare Other | Source: Ambulatory Visit | Attending: Radiation Oncology | Admitting: Radiation Oncology

## 2018-08-02 ENCOUNTER — Ambulatory Visit: Payer: Medicare Other

## 2018-08-02 DIAGNOSIS — Z170 Estrogen receptor positive status [ER+]: Secondary | ICD-10-CM | POA: Diagnosis not present

## 2018-08-02 DIAGNOSIS — C50412 Malignant neoplasm of upper-outer quadrant of left female breast: Secondary | ICD-10-CM | POA: Diagnosis not present

## 2018-08-02 DIAGNOSIS — Z510 Encounter for antineoplastic radiation therapy: Secondary | ICD-10-CM | POA: Diagnosis not present

## 2018-08-03 ENCOUNTER — Encounter: Payer: Self-pay | Admitting: Radiation Oncology

## 2018-08-03 ENCOUNTER — Ambulatory Visit: Payer: Medicare Other

## 2018-08-03 ENCOUNTER — Ambulatory Visit
Admission: RE | Admit: 2018-08-03 | Discharge: 2018-08-03 | Disposition: A | Discharge status: Home / Self Care | Payer: Medicare Other | Source: Ambulatory Visit | Attending: Radiation Oncology | Admitting: Radiation Oncology

## 2018-08-03 DIAGNOSIS — C50412 Malignant neoplasm of upper-outer quadrant of left female breast: Secondary | ICD-10-CM

## 2018-08-03 DIAGNOSIS — R531 Weakness: Secondary | ICD-10-CM | POA: Diagnosis not present

## 2018-08-03 DIAGNOSIS — M25661 Stiffness of right knee, not elsewhere classified: Secondary | ICD-10-CM | POA: Diagnosis not present

## 2018-08-03 DIAGNOSIS — Z510 Encounter for antineoplastic radiation therapy: Secondary | ICD-10-CM | POA: Diagnosis not present

## 2018-08-03 DIAGNOSIS — Z170 Estrogen receptor positive status [ER+]: Secondary | ICD-10-CM | POA: Diagnosis not present

## 2018-08-03 DIAGNOSIS — R262 Difficulty in walking, not elsewhere classified: Secondary | ICD-10-CM | POA: Diagnosis not present

## 2018-08-03 DIAGNOSIS — G5782 Other specified mononeuropathies of left lower limb: Secondary | ICD-10-CM | POA: Diagnosis not present

## 2018-08-06 ENCOUNTER — Encounter: Payer: Self-pay | Admitting: *Deleted

## 2018-08-06 ENCOUNTER — Ambulatory Visit: Payer: Medicare Other

## 2018-08-06 ENCOUNTER — Other Ambulatory Visit: Payer: Self-pay | Admitting: *Deleted

## 2018-08-06 DIAGNOSIS — R531 Weakness: Secondary | ICD-10-CM | POA: Diagnosis not present

## 2018-08-06 DIAGNOSIS — M25661 Stiffness of right knee, not elsewhere classified: Secondary | ICD-10-CM | POA: Diagnosis not present

## 2018-08-06 DIAGNOSIS — R262 Difficulty in walking, not elsewhere classified: Secondary | ICD-10-CM | POA: Diagnosis not present

## 2018-08-06 DIAGNOSIS — C50412 Malignant neoplasm of upper-outer quadrant of left female breast: Secondary | ICD-10-CM

## 2018-08-06 DIAGNOSIS — G5782 Other specified mononeuropathies of left lower limb: Secondary | ICD-10-CM | POA: Diagnosis not present

## 2018-08-06 DIAGNOSIS — Z170 Estrogen receptor positive status [ER+]: Principal | ICD-10-CM

## 2018-08-06 NOTE — Progress Notes (Signed)
[place]  [phone] [phone]     ID: ZARRIA TOWELL DOB: [birth year]  MR#: [id number]  YSA#:[id number]  Patient Care Team: Shon Baton, MD as PCP - General Rolm Bookbinder, MD as Consulting Physician (General Surgery) Tomothy Eddins, Virgie Dad, MD as Consulting Physician (Oncology) Eppie Gibson, MD as Attending Physician (Radiation Oncology) Lelon Perla, MD as Consulting Physician (Cardiology) OTHER MD:  CHIEF COMPLAINT: Estrogen receptor positive breast cancer  CURRENT TREATMENT: To start tamoxifen    HISTORY OF CURRENT ILLNESS: From the original intake note:  "[name]" had routine diagnostic mammography on 03/08/2018 showing breast density category D.  There was a 1.5 cm irregular mass in the left upper outer quadrant posterior depth.  This was further imaged with ultrasonography on the same day.  The left axilla was sonographically benign.  No abnormalities were seen in the left axilla.  Accordingly on 03/08/2018 she proceeded to biopsy of the left breast area in question. The pathology from this procedure showed ([id number]): Invasive ductal carcinoma, grade I-II. HER-2 negative with the ratio being 1.61 and number per cell 3.30.  The tumor was estrogen receptor 100% positive and progesterone receptor 100% positive, both with strong staining intensity.  Ki67 was <1%.   On 03/18/2018, she underwent a bilateral breast MRI with and without contrast, showing the mass in question in a setting of 3.3 cm non-masslike enhancement in the posterior aspect of the left upper-outer quadrant. in addition there was a 1.1 cm mass in the left upper-outer quadrant located 2.1 cm anterior and lateral to the biopsy proven malignant.   Right breast showed two indeterminate 5 mm adjacent enhancing nodules in the upper-inner quadrant.   The patient's subsequent history is as detailed below.  INTERVAL HISTORY: [name] returns to day follow up and treatment of her  estrogen receptor positive breast cancer accompanied by her husband. She completed radiation treatments on 08/03/2018. She felt fine during the 1st two weeks, but by the 3rd week, she felt fatigued. She had a red rash in the right axilla. She had left breast shooting pains. She also has pain that extends from her shoulder and down her arm.   Since her last visit, she underwent right lumpectomy and SNL sampling ([id number]) on 04/17/2018 with patholgy showing: In the right breast, atypical lobular hyperplasia. On the left, multifocal invasive lobular carcinoma, grade 2, largest spanning 1.5 cm. Lobular carcinoma in situ. Invasive tumor is present at the lateral and medial margin broadly and posterior margin focally. The inferior margin had LCIS. A total of 4 left axillary sentinel  lymph nodes wer removed, all negative for carcinoma.   She had final clearing of the left lateral medial and posterior margins ([id number]) on 05/15/2018.  Pelvic and transvaginal ultrasonography 04/11/2018 showed a retroverted uterus with a suspected polyp measuring 1.2 cm.  On 06/07/2018 she underwent endometrial curettage under Dr. Tressia Danas, with benign pathology   REVIEW OF SYSTEMS: [name] has been going to physical therapy for her knee. She tried going to exercise during the 1st three weeks of radiation. She belongs to a fitness center at [employer]. She plans to start swimming after her skin recovers from radiation. She denies unusual headaches, visual changes, nausea, vomiting, or dizziness. There has been no unusual cough, phlegm production, or pleurisy. There has been no change in bowel or bladder habits. She denies unexplained fatigue or unexplained weight loss, bleeding, rash, or fever. A detailed review of systems was otherwise stable.    PAST MEDICAL  HISTORY: Past Medical History:  Diagnosis Date  . Arthritis    hands and knees  . Complication of anesthesia    aspiration pna; following a  colonoscopy, pt requests head of bed elevated if possible.  . Constipation   . Cough   . Depression   . Dysrhythmia    RBBB on 06-06-17 ekg   . Elevated liver function tests   . Esophageal stricture   . Family history of breast cancer   . Gallstones   . Genetic testing 04/02/2018   STAT Breast panel with reflex to Multi-Cancer panel (83 genes) @ [facility] - No pathogenic mutations detected  . GERD (gastroesophageal reflux disease)   . Hiatal hernia   . History of kidney stones   . Hyperlipemia   . Hypothyroidism   . Insomnia   . Malignant neoplasm of upper-outer quadrant of left female breast ([place])   . Nephrolithiasis   . Pneumonia 2015   aspirated after colonosopy  . Renal cyst     PAST SURGICAL HISTORY: Past Surgical History:  Procedure Laterality Date  . BREAST LUMPECTOMY WITH RADIOACTIVE SEED AND SENTINEL LYMPH NODE BIOPSY Left 04/17/2018   Procedure: LEFT BREAST LUMPECTOMY WITH BRACKETED RADIOACTIVE SEEDS AND SENTINEL LYMPH NODE BIOPSY;  Surgeon: Rolm Bookbinder, MD;  Location: [facility];  Service: General;  Laterality: Left;  . BREAST SURGERY Left    Lumpectomy  . CARDIOVASCULAR STRESS TEST  07/19/2006   EF 70%, NO EVIDENCE OF ISCHEMIA  . CHOLECYSTECTOMY  12/30/10  . COLONOSCOPY    . DILATION AND CURETTAGE OF UTERUS    . HYSTEROSCOPY W/D&C N/A 06/07/2018   Procedure: DILATATION AND CURETTAGE /HYSTEROSCOPY;  Surgeon: [name], MD;  Location: [facility];  Service: Gynecology;  Laterality: N/A;  . inguinal herniography    . NASAL SINUS SURGERY    . RADIOACTIVE SEED GUIDED EXCISIONAL BREAST BIOPSY Right 04/17/2018   Procedure: RIGHT BREAST SEED GUIDED EXCISIONAL BIOPSY;  Surgeon: Rolm Bookbinder, MD;  Location: [facility];  Service: General;  Laterality: Right;  . RE-EXCISION OF BREAST LUMPECTOMY Left 05/15/2018   Procedure: RE-EXCISION OF LEFT BREAST LUMPECTOMY, ASPIRATION OF LEFT AXILLARY SEROMA;  Surgeon: Rolm Bookbinder, MD;  Location:  [place];  Service: General;  Laterality: Left;  . T & A    . TOOTH EXTRACTION    . TOTAL KNEE ARTHROPLASTY Left 09/11/2017   Procedure: LEFT TOTAL KNEE ARTHROPLASTY;  Surgeon: Gaynelle Arabian, MD;  Location: [facility];  Service: Orthopedics;  Laterality: Left;  with block  . US ECHOCARDIOGRAPHY  07/17/2006   EF 55-60%    FAMILY HISTORY Family History  Problem Relation Age of Onset  . Breast cancer Mother 68       deceased 68; TAH/BSO in 60s/50s  . Dementia Father        deceased 74  . Breast cancer Maternal Aunt 82       deceased 60s  . Colon cancer Neg Hx   . Stomach cancer Neg Hx   Her father died of old age at 70 years old. Her mother had breast cancer at 48 and died at 72 years old. She will undergo genetic testing. Her maternal aunt had breast cancer at 40 years old. No family history of ovarian cancer. She had one brother and one sister.   GYNECOLOGIC HISTORY:  No LMP recorded. Patient is postmenopausal. Menarche: 72 years old [place] P 0 LMP: 1997 Contraceptive: 2 years HRT: Estrogen and progesterone for about 7 years  Hysterectomy? No  SOCIAL HISTORY:  She is a retired Futures trader. She has been married to Soddy-Daisy, for 45 plus years. He worked in the [employer]. Together they adopted two children. One daughter, [name], 50 of [place], who is a Psychologist, sport and exercise for an OBGYN, and one son, Jakeline Dave, 36 of [place], who owns a Marketing executive business. The patient as three grandchildren. She attends [employer].    ADVANCED DIRECTIVES: In place   HEALTH MAINTENANCE: Social History   Tobacco Use  . Smoking status: Never Smoker  . Smokeless tobacco: Never Used  Substance Use Topics  . Alcohol use: No  . Drug use: No     Colonoscopy: 2014/ [facility]  PAP: 2018  Bone density:  May 2014 at [facility] showed a T score of -0.9 normal   Allergies  Allergen Reactions  . Oxycontin [Oxycodone Hcl]     "it made me feel very high  and hyper"   . Codeine Nausea And Vomiting    Current Outpatient Medications  Medication Sig Dispense Refill  . acetaminophen (TYLENOL) 500 MG tablet Take 500 mg by mouth every 6 (six) hours as needed for mild pain or headache.    Marland Kitchen azelastine (ASTELIN) 0.1 % nasal spray Place 1 spray into both nostrils 2 (two) times daily. Use in each nostril as directed    . Cholecalciferol (VITAMIN D3) 1000 units CAPS Take 1 capsule by mouth 2 (two) times daily.     Marland Kitchen escitalopram (LEXAPRO) 10 MG tablet Take 15 mg by mouth daily.     Marland Kitchen esomeprazole (NEXIUM) 40 MG capsule Take 40 mg by mouth 2 (two) times daily before a meal.    . ezetimibe (ZETIA) 10 MG tablet Take 10 mg by mouth at bedtime.    . fluticasone (FLONASE) 50 MCG/ACT nasal spray Place 1 spray into both nostrils daily.    Marland Kitchen levothyroxine (SYNTHROID, LEVOTHROID) 50 MCG tablet Take 50 mcg by mouth at bedtime.    Marland Kitchen LORazepam (ATIVAN) 0.5 MG tablet Take 1 tab 30 min before simulation and your first treatment. 2 tablet 0  . Magnesium 250 MG TABS Take 1 tablet by mouth daily.     . naproxen sodium (ALEVE) 220 MG tablet Take 220 mg by mouth 2 (two) times daily as needed (pain).    Marland Kitchen OVER THE COUNTER MEDICATION Apply 1 application topically daily as needed (pain). Natural pain relief ointment    . Probiotic Product (PROBIOTIC ADVANCED PO) Take 1 tablet by mouth daily.    . rosuvastatin (CRESTOR) 5 MG tablet Take 5 mg by mouth at bedtime.     . sodium chloride (OCEAN) 0.65 % SOLN nasal spray Place 4 sprays into both nostrils 4 (four) times daily.    . SUPER B COMPLEX/C PO Take 1 tablet by mouth daily.    . traMADol (ULTRAM) 50 MG tablet Take 2 tablets (100 mg total) by mouth every 6 (six) hours as needed. 10 tablet 0  . zolpidem (AMBIEN) 10 MG tablet zolpidem 10 mg tablet  take 1 tablet by mouth at bedtime if needed     No current facility-administered medications for this visit.     OBJECTIVE: Middle-aged white woman who appears stated  age  72:   08/07/18 1419  BP: 112/60  Pulse: 85  Resp: 18  Temp: 97.8 F (36.6 C)  SpO2: 97%     Body mass index is 25.59 kg/m.   Wt Readings from Last 3 Encounters:  08/07/18  168 lb 4.8 oz (76.3 kg)  06/05/18 166 lb 12.8 oz (75.7 kg)  05/28/18 167 lb 4 oz (75.9 kg)      ECOG FS:1 - Symptomatic but completely ambulatory  Sclerae unicteric, EOMs intact Oropharynx clear and moist No cervical or supraclavicular adenopathy Lungs no rales or rhonchi Heart regular rate and rhythm Abd soft, nontender, positive bowel sounds MSK no focal spinal tenderness, no upper extremity lymphedema Neuro: nonfocal, well oriented, appropriate affect Breasts: The right breast is status post recent lumpectomy.  The incision is healing very nicely.  The cosmetic result is excellent.  There is no erythema dehiscence or swelling.  The right axilla is benign.  The left breast is status post lumpectomy and radiation.  There is significant dry desquamation, erythema, and hyperpigmentation.  There is no evidence of residual disease.  Overall the cosmetic result is very good, with the difference in size between breast less than 5%.  In the left axilla there is a small palpable mass approximately 2 cm likely a seroma.  LAB RESULTS:  CMP     Component Value Date/Time   NA 141 09/13/2017 0528   NA 144 06/06/2017 1448   K 4.1 09/13/2017 0528   CL 107 09/13/2017 0528   CO2 29 09/13/2017 0528   GLUCOSE 112 (H) 09/13/2017 0528   BUN 19 09/13/2017 0528   BUN 12 06/06/2017 1448   CREATININE 0.64 09/13/2017 0528   CALCIUM 9.2 09/13/2017 0528   PROT 7.0 09/04/2017 1530   PROT 6.3 06/06/2017 1448   ALBUMIN 4.3 09/04/2017 1530   ALBUMIN 4.4 06/06/2017 1448   AST 44 (H) 09/04/2017 1530   ALT 46 09/04/2017 1530   ALKPHOS 94 09/04/2017 1530   BILITOT 0.3 09/04/2017 1530   BILITOT 0.3 06/06/2017 1448   GFRNONAA >60 09/13/2017 0528   GFRAA >60 09/13/2017 0528    No results found for: TOTALPROTELP,  ALBUMINELP, A1GS, A2GS, BETS, BETA2SER, GAMS, MSPIKE, SPEI  No results found for: KPAFRELGTCHN, LAMBDASER, KAPLAMBRATIO  Lab Results  Component Value Date   WBC 6.3 08/07/2018   NEUTROABS 3.2 08/07/2018   HGB 11.6 08/07/2018   HCT 36.5 08/07/2018   MCV 86.3 08/07/2018   PLT 169 08/07/2018    _0 @  No results found for: LABCA2  No components found for: [id number]  No results for input(s): INR in the last 168 hours.  No results found for: LABCA2  No results found for: WUJ811  No results found for: BJY782  No results found for: NFA213  No results found for: CA2729  No components found for: HGQUANT  No results found for: CEA1 / No results found for: CEA1   No results found for: AFPTUMOR  No results found for: CHROMOGRNA  No results found for: PSA1  Appointment on 08/07/2018  Component Date Value Ref Range Status  . WBC Count 08/07/2018 6.3  3.9 - 10.3 K/uL Final  . RBC 08/07/2018 4.23  3.70 - 5.45 MIL/uL Final  . Hemoglobin 08/07/2018 11.6  11.6 - 15.9 g/dL Final  . HCT 08/07/2018 36.5  34.8 - 46.6 % Final  . MCV 08/07/2018 86.3  79.5 - 101.0 fL Final  . MCH 08/07/2018 27.4  25.1 - 34.0 pg Final  . MCHC 08/07/2018 31.8  31.5 - 36.0 g/dL Final  . RDW 08/07/2018 15.4* 11.2 - 14.5 % Final  . Platelet Count 08/07/2018 169  145 - 400 K/uL Final  . Neutrophils Relative % 08/07/2018 49  % Final  . Neutro Abs 08/07/2018 3.2  1.5 - 6.5 K/uL Final  . Lymphocytes Relative 08/07/2018 30  % Final  . Lymphs Abs 08/07/2018 1.9  0.9 - 3.3 K/uL Final  . Monocytes Relative 08/07/2018 10  % Final  . Monocytes Absolute 08/07/2018 0.6  0.1 - 0.9 K/uL Final  . Eosinophils Relative 08/07/2018 10  % Final  . Eosinophils Absolute 08/07/2018 0.6* 0.0 - 0.5 K/uL Final  . Basophils Relative 08/07/2018 1  % Final  . Basophils Absolute 08/07/2018 0.0  0.0 - 0.1 K/uL Final   Performed at [facility], [address] [place], [place] [address]    (this  displays the last labs from the last 3 days)  No results found for: TOTALPROTELP, ALBUMINELP, A1GS, A2GS, BETS, BETA2SER, GAMS, MSPIKE, SPEI (this displays SPEP labs)  No results found for: KPAFRELGTCHN, LAMBDASER, KAPLAMBRATIO (kappa/lambda light chains)  No results found for: HGBA, HGBA2QUANT, HGBFQUANT, HGBSQUAN (Hemoglobinopathy evaluation)   No results found for: LDH  No results found for: IRON, TIBC, IRONPCTSAT (Iron and TIBC)  No results found for: FERRITIN  Urinalysis    Component Value Date/Time   COLORURINE YELLOW 08/22/2016 1947   APPEARANCEUR CLEAR 08/22/2016 1947   LABSPEC 1.026 08/22/2016 1947   PHURINE 5.5 08/22/2016 1947   GLUCOSEU NEGATIVE 08/22/2016 1947   HGBUR SMALL (A) 08/22/2016 1947   BILIRUBINUR NEGATIVE 08/22/2016 [place] NEGATIVE 08/22/2016 1947   PROTEINUR NEGATIVE 08/22/2016 1947   UROBILINOGEN 0.2 08/22/2016 1622   NITRITE NEGATIVE 08/22/2016 1947   LEUKOCYTESUR SMALL (A) 08/22/2016 1947     STUDIES: Most recent bone density was obtained at [facility] on 04/18/2013, showing a T score of -0.9 (normal). ELIGIBLE FOR AVAILABLE RESEARCH PROTOCOL: exact sciences study  ASSESSMENT: 72 y.o. [place] woman status post left breast upper outer quadrant biopsy 03/08/2018 for a clinical T1c-T2 N0, stage I invasive ductal carcinoma, grade 1 or 2, estrogen and progesterone receptor positive, HER-2 not amplified, with an MIB-1 of less than 1%  (1) additional biopsies pending  (a) right breast biopsy ([id number]) on 04/02/2018 showed Atypical lobular hyperplasia  (b) left breast ([id number]) on 03/22/2018 showed invasive lobular carcinoma, grade 2, estrogen and progesterone receptor positive with an MIB-1 of 5%. HER-2 not amplified  (2) bilateral lumpectomies and left sentinel lymph node sampling 04/17/2018 found  (a) on the right side, atypical lobular hyperplasia  (b) on the left side, and mpT1c pN0, stage IA, grade 2 invasive lobular carcinoma,  with positive margins  (c) additional surgery on 05/15/2018 cleared the compromise margins (3) The Oncotype DX score was 14, predicting a risk of outside the breast recurrence over the next 9 years of 4% if the patient's only systemic therapy is tamoxifen for 5 years.  It also predicts no significant benefit from chemotherapy.  (4) adjuvant radiation 07/09/2018 - 08/03/2018 Site/dose:    1. Left Breast / 40.05 Gy in 15 fractions 2. Left Breast Boost / 10 Gy in 5 fractions  (5) to start tamoxifen 09/11/2018  (a) bone density May 2014 normal, T score -0.9  (6) genetics testing 03/22/2018 through [facility]'s STAT Breast panel with reflex to Multi-Cancer panel found no pathogenic mutations in (ALK, APC, ATM, AXIN2, BAP1, BARD1, BLM, BMPR1A, BRCA1, BRCA2, BRIP1, CASR, CDC73, CDH1, CDK4, CDKN1B, CDKN1C, CDKN2A, CEBPA, CHEK2, CTNNA1, DICER1, DIS3L2, EGFR, EPCAM, FH, FLCN, GATA2, GPC3, GREM1, HOXB13, HRAS, KIT, MAX, MEN1, MET, MITF, MLH1, MSH2, MSH3, MSH6, MUTYH, NBN, NF1, NF2, NTHL1, PALB2, PDGFRA, PHOX2B, PMS2, POLD1, POLE, POT1, PRKAR1A, PTCH1, PTEN, RAD50, RAD51C, RAD51D, RB1,  RECQL4, RET, RUNX1, SDHA, SDHAF2, SDHB, SDHC, SDHD, SMAD4, SMARCA4, SMARCB1, SMARCE1, STK11, SUFU, TERC, TERT, TMEM127, TP53, TSC1, TSC2, VHL, WRN, WT1).  (a) Variants of Uncertain Significance were found in in PDGFRA and RECQL4.  PLAN: I spent approximately 45 minutes face to face with Benjamine Mola with more than 50% of that time spent in counseling and coordination of care.  We reviewed her surgical pathology and she received copies of all the procedures, including the one that cleared her lymph margins and the recent endometrial curettage results.  We also reviewed the results of the Oncotype, which shows an excellent prognosis without the need of chemotherapy so long as she takes antiestrogens for 5 years  We described the different types of breast cancer risk namely risk of local recurrence, risk of systemic recurrence, and risk  of a new breast cancer developing in either breast.  She understands that antiestrogens will cut all those risks essentially in half.  We then discussed the difference between tamoxifen and anastrozole in detail. She understands that anastrozole and the aromatase inhibitors in general work by blocking estrogen production. Accordingly vaginal dryness, decrease in bone density, and of course hot flashes can result. The aromatase inhibitors can also negatively affect the cholesterol profile, although that is a minor effect. One out of 5 women on aromatase inhibitors we will feel "old and achy". This arthralgia/myalgia syndrome, which resembles fibromyalgia clinically, does resolve with stopping the medications. Accordingly this is not a reason to not try an aromatase inhibitor but it is a frequent reason to stop it (in other words 20% of women will not be able to tolerate these medications).  Tamoxifen on the other hand does not block estrogen production. It does not "take away a woman's estrogen". It blocks the estrogen receptor in breast cells. Like anastrozole, it can also cause hot flashes. As opposed to anastrozole, tamoxifen has many estrogen-like effects. It is technically an estrogen receptor modulator. This means that in some tissues tamoxifen works like estrogen-- for example it helps strengthen the bones. It tends to improve the cholesterol profile. It can cause thickening of the endometrial lining, and even endometrial polyps or rarely cancer of the uterus.(The risk of uterine cancer due to tamoxifen is one additional cancer per thousand women year). It can cause vaginal wetness or stickiness. It can cause blood clots through this estrogen-like effect--the risk of blood clots with tamoxifen is exactly the same as with birth control pills or hormone replacement.  Neither of these agents causes mood changes or weight gain, despite the popular belief that they can have these side effects. We have data  from studies comparing either of these drugs with placebo, and in those cases the control group had the same amount of weight gain and depression as the group that took the drug.  After this discussion she felt clearly she would like to try tamoxifen.  Since she is still far from being fully recovered from her local treatment I think starting 09/11/2018 is prudent.  Accordingly she will see me again in December to assess tolerance.  If she tolerates tamoxifen well the plan will be to take that a minimum of 5 years, but it could be longer since lobular breast cancers if they are to recur, tend to recur late  She will have a bone density scan at [facility] shortly before her December visit  She knows to call for any other issues that may develop before her return visit here.      Taishaun Levels,  Virgie Dad, MD  08/07/18 2:34 PM Medical Oncology and Hematology [facility] [address] [place], [place] [address] Tel. [phone]    Fax. [phone]  Alice Rieger, am acting as scribe for Chauncey Cruel MD.  I, Lurline Del MD, have reviewed the above documentation for accuracy and completeness, and I agree with the above.

## 2018-08-07 ENCOUNTER — Inpatient Hospital Stay (HOSPITAL_BASED_OUTPATIENT_CLINIC_OR_DEPARTMENT_OTHER): Payer: Medicare Other | Admitting: Oncology

## 2018-08-07 ENCOUNTER — Telehealth: Payer: Self-pay | Admitting: Oncology

## 2018-08-07 ENCOUNTER — Inpatient Hospital Stay: Payer: Medicare Other | Attending: Oncology

## 2018-08-07 ENCOUNTER — Ambulatory Visit: Payer: Medicare Other

## 2018-08-07 VITALS — BP 112/60 | HR 85 | Temp 97.8°F | Resp 18 | Ht 68.0 in | Wt 168.3 lb

## 2018-08-07 DIAGNOSIS — M797 Fibromyalgia: Secondary | ICD-10-CM | POA: Diagnosis not present

## 2018-08-07 DIAGNOSIS — Z170 Estrogen receptor positive status [ER+]: Secondary | ICD-10-CM | POA: Insufficient documentation

## 2018-08-07 DIAGNOSIS — C50412 Malignant neoplasm of upper-outer quadrant of left female breast: Secondary | ICD-10-CM | POA: Diagnosis not present

## 2018-08-07 DIAGNOSIS — Z79899 Other long term (current) drug therapy: Secondary | ICD-10-CM

## 2018-08-07 LAB — CBC WITH DIFFERENTIAL (CANCER CENTER ONLY)
BASOS ABS: 0 10*3/uL (ref 0.0–0.1)
BASOS PCT: 1 %
EOS ABS: 0.6 10*3/uL — AB (ref 0.0–0.5)
Eosinophils Relative: 10 %
HCT: 36.5 % (ref 34.8–46.6)
HEMOGLOBIN: 11.6 g/dL (ref 11.6–15.9)
Lymphocytes Relative: 30 %
Lymphs Abs: 1.9 10*3/uL (ref 0.9–3.3)
MCH: 27.4 pg (ref 25.1–34.0)
MCHC: 31.8 g/dL (ref 31.5–36.0)
MCV: 86.3 fL (ref 79.5–101.0)
Monocytes Absolute: 0.6 10*3/uL (ref 0.1–0.9)
Monocytes Relative: 10 %
NEUTROS ABS: 3.2 10*3/uL (ref 1.5–6.5)
NEUTROS PCT: 49 %
Platelet Count: 169 10*3/uL (ref 145–400)
RBC: 4.23 MIL/uL (ref 3.70–5.45)
RDW: 15.4 % — ABNORMAL HIGH (ref 11.2–14.5)
WBC Count: 6.3 10*3/uL (ref 3.9–10.3)

## 2018-08-07 LAB — CMP (CANCER CENTER ONLY)
ALK PHOS: 132 U/L — AB (ref 38–126)
ALT: 66 U/L — ABNORMAL HIGH (ref 0–44)
ANION GAP: 5 (ref 5–15)
AST: 92 U/L — ABNORMAL HIGH (ref 15–41)
Albumin: 3.9 g/dL (ref 3.5–5.0)
BILIRUBIN TOTAL: 0.6 mg/dL (ref 0.3–1.2)
BUN: 10 mg/dL (ref 8–23)
CALCIUM: 9.7 mg/dL (ref 8.9–10.3)
CO2: 30 mmol/L (ref 22–32)
Chloride: 104 mmol/L (ref 98–111)
Creatinine: 0.93 mg/dL (ref 0.44–1.00)
Glucose, Bld: 119 mg/dL — ABNORMAL HIGH (ref 70–99)
Potassium: 3.9 mmol/L (ref 3.5–5.1)
Sodium: 139 mmol/L (ref 135–145)
TOTAL PROTEIN: 6.7 g/dL (ref 6.5–8.1)

## 2018-08-07 NOTE — Progress Notes (Signed)
  Radiation Oncology         [phone] ________________________________  Name: [name] MRN: [id number]  Date: 08/03/2018  DOB: 1946-11-10  End of Treatment Note  Diagnosis:   72 y.o. female with StageT1cN0M0LeftBreast UOQ Invasive Ductal Carcinomawith DCIS, ER+/ PR+/ Her2-, Grade 2  Cancer Staging Malignant neoplasm of upper-outer quadrant of left breast in female, estrogen receptor positive ([place]) Staging form: Breast, AJCC 8th Edition - Clinical stage from 03/21/2018: Stage IA (cT1c, cN0, cM0, G2, ER+, PR+, HER2-) - Unsigned - Pathologic: Stage IA (pT1c, pN0, cM0, G2, ER+, PR+, HER2-) - Unsigned   Indication for treatment:  Curative       Radiation treatment dates:   07/09/2018 - 08/03/2018  Site/dose:    1. Left Breast / 40.05 Gy in 15 fractions 2. Left Breast Boost / 10 Gy in 5 fractions  Beams/energy:    1. 3D / 6X Photon 2. 3D / 10X, 6X Photon  Narrative: The patient tolerated radiation treatment relatively well.  She experienced increased fatigue and some discomfort at her incisional sites with mild seromas under the axillary and lumpectomy scars. She did have some expected skin changes in the treated area, notably mild erythema and dermatitis. Moist desquamation was not present at any time during treatment.  Plan: The patient has completed radiation treatment. The patient was advised to take Aleve BID for pain and may use hydrocortisone 1% cream as needed for itching. The patient will return to radiation oncology clinic for routine followup in one month. I advised them to call or return sooner if they have any questions or concerns related to their recovery or treatment.  -----------------------------------  Eppie Gibson, MD  This document serves as a record of services personally performed by Eppie Gibson, MD. It was created on her behalf by Rae Lips, a trained medical scribe. The creation of this record is based on the scribe's personal  observations and the provider's statements to them. This document has been checked and approved by the attending provider.

## 2018-08-07 NOTE — Telephone Encounter (Signed)
Spoke with Val, Therapist, sports.  Patient wanted prescription that is to start in October to go to e-scripts.

## 2018-08-07 NOTE — Telephone Encounter (Signed)
Per 8/27 los, gave patient avs and calendar.  Bone Density order faxed to [facility].

## 2018-08-08 ENCOUNTER — Ambulatory Visit: Payer: Medicare Other

## 2018-08-09 ENCOUNTER — Ambulatory Visit: Payer: Medicare Other

## 2018-08-09 DIAGNOSIS — M25661 Stiffness of right knee, not elsewhere classified: Secondary | ICD-10-CM | POA: Diagnosis not present

## 2018-08-09 DIAGNOSIS — R262 Difficulty in walking, not elsewhere classified: Secondary | ICD-10-CM | POA: Diagnosis not present

## 2018-08-09 DIAGNOSIS — R531 Weakness: Secondary | ICD-10-CM | POA: Diagnosis not present

## 2018-08-09 DIAGNOSIS — G5782 Other specified mononeuropathies of left lower limb: Secondary | ICD-10-CM | POA: Diagnosis not present

## 2018-08-10 ENCOUNTER — Ambulatory Visit: Payer: Medicare Other

## 2018-08-14 ENCOUNTER — Ambulatory Visit: Payer: Medicare Other

## 2018-08-15 ENCOUNTER — Ambulatory Visit: Payer: Medicare Other

## 2018-08-15 DIAGNOSIS — M25661 Stiffness of right knee, not elsewhere classified: Secondary | ICD-10-CM | POA: Diagnosis not present

## 2018-08-15 DIAGNOSIS — G5782 Other specified mononeuropathies of left lower limb: Secondary | ICD-10-CM | POA: Diagnosis not present

## 2018-08-15 DIAGNOSIS — R262 Difficulty in walking, not elsewhere classified: Secondary | ICD-10-CM | POA: Diagnosis not present

## 2018-08-15 DIAGNOSIS — R531 Weakness: Secondary | ICD-10-CM | POA: Diagnosis not present

## 2018-08-16 ENCOUNTER — Ambulatory Visit: Payer: Medicare Other

## 2018-08-16 ENCOUNTER — Encounter: Payer: Self-pay | Admitting: Oncology

## 2018-08-16 DIAGNOSIS — M8589 Other specified disorders of bone density and structure, multiple sites: Secondary | ICD-10-CM | POA: Diagnosis not present

## 2018-08-17 ENCOUNTER — Ambulatory Visit: Payer: Medicare Other

## 2018-08-17 ENCOUNTER — Encounter

## 2018-08-17 DIAGNOSIS — R262 Difficulty in walking, not elsewhere classified: Secondary | ICD-10-CM | POA: Diagnosis not present

## 2018-08-17 DIAGNOSIS — G5782 Other specified mononeuropathies of left lower limb: Secondary | ICD-10-CM | POA: Diagnosis not present

## 2018-08-17 DIAGNOSIS — M25661 Stiffness of right knee, not elsewhere classified: Secondary | ICD-10-CM | POA: Diagnosis not present

## 2018-08-17 DIAGNOSIS — R531 Weakness: Secondary | ICD-10-CM | POA: Diagnosis not present

## 2018-08-20 ENCOUNTER — Ambulatory Visit: Payer: Medicare Other

## 2018-08-20 ENCOUNTER — Ambulatory Visit: Payer: Medicare Other | Attending: General Surgery | Admitting: Rehabilitation

## 2018-08-20 ENCOUNTER — Other Ambulatory Visit: Payer: Self-pay

## 2018-08-20 ENCOUNTER — Encounter: Payer: Self-pay | Admitting: Rehabilitation

## 2018-08-20 DIAGNOSIS — Z170 Estrogen receptor positive status [ER+]: Secondary | ICD-10-CM | POA: Insufficient documentation

## 2018-08-20 DIAGNOSIS — M25661 Stiffness of right knee, not elsewhere classified: Secondary | ICD-10-CM | POA: Diagnosis not present

## 2018-08-20 DIAGNOSIS — G5782 Other specified mononeuropathies of left lower limb: Secondary | ICD-10-CM | POA: Diagnosis not present

## 2018-08-20 DIAGNOSIS — M79602 Pain in left arm: Secondary | ICD-10-CM | POA: Diagnosis not present

## 2018-08-20 DIAGNOSIS — R262 Difficulty in walking, not elsewhere classified: Secondary | ICD-10-CM | POA: Diagnosis not present

## 2018-08-20 DIAGNOSIS — R531 Weakness: Secondary | ICD-10-CM | POA: Diagnosis not present

## 2018-08-20 DIAGNOSIS — R293 Abnormal posture: Secondary | ICD-10-CM | POA: Diagnosis not present

## 2018-08-20 DIAGNOSIS — C50412 Malignant neoplasm of upper-outer quadrant of left female breast: Secondary | ICD-10-CM | POA: Insufficient documentation

## 2018-08-20 DIAGNOSIS — M25612 Stiffness of left shoulder, not elsewhere classified: Secondary | ICD-10-CM | POA: Diagnosis not present

## 2018-08-20 DIAGNOSIS — R2689 Other abnormalities of gait and mobility: Secondary | ICD-10-CM | POA: Insufficient documentation

## 2018-08-20 DIAGNOSIS — R600 Localized edema: Secondary | ICD-10-CM | POA: Insufficient documentation

## 2018-08-20 NOTE — Therapy (Signed)
[place], [place], [address] Phone: [phone]   Fax:  [phone]  Physical Therapy Evaluation  Patient Details  Name: MAKINZEY BANES MRN: [id number] Date of Birth: May 19, 1946 Referring Provider: Dr. Donne Hazel   Encounter Date: 08/20/2018  PT End of Session - 08/20/18 1747    Visit Number  1    Number of Visits  9    Date for PT Re-Evaluation  09/17/18    Authorization Type  medicare/tricare    PT Start Time  1515    PT Stop Time  1600    PT Time Calculation (min)  45 min    Activity Tolerance  Patient tolerated treatment well    Behavior During Therapy  [facility] for tasks assessed/performed       Past Medical History:  Diagnosis Date  . Arthritis    hands and knees  . Complication of anesthesia    aspiration pna; following a colonoscopy, pt requests head of bed elevated if possible.  . Constipation   . Cough   . Depression   . Dysrhythmia    RBBB on 06-06-17 ekg   . Elevated liver function tests   . Esophageal stricture   . Family history of breast cancer   . Gallstones   . Genetic testing 04/02/2018   STAT Breast panel with reflex to Multi-Cancer panel (83 genes) @ [facility] - No pathogenic mutations detected  . GERD (gastroesophageal reflux disease)   . Hiatal hernia   . History of kidney stones   . Hyperlipemia   . Hypothyroidism   . Insomnia   . Malignant neoplasm of upper-outer quadrant of left female breast ([place])   . Nephrolithiasis   . Pneumonia 2015   aspirated after colonosopy  . Renal cyst     Past Surgical History:  Procedure Laterality Date  . BREAST LUMPECTOMY WITH RADIOACTIVE SEED AND SENTINEL LYMPH NODE BIOPSY Left 04/17/2018   Procedure: LEFT BREAST LUMPECTOMY WITH BRACKETED RADIOACTIVE SEEDS AND SENTINEL LYMPH NODE BIOPSY;  Surgeon: Rolm Bookbinder, MD;  Location: [place];  Service: General;  Laterality: Left;  . BREAST SURGERY Left    Lumpectomy   . CARDIOVASCULAR STRESS TEST  07/19/2006   EF 70%, NO EVIDENCE OF ISCHEMIA  . CHOLECYSTECTOMY  12/30/10  . COLONOSCOPY    . DILATION AND CURETTAGE OF UTERUS    . HYSTEROSCOPY W/D&C N/A 06/07/2018   Procedure: DILATATION AND CURETTAGE /HYSTEROSCOPY;  Surgeon: [name], MD;  Location: [facility];  Service: Gynecology;  Laterality: N/A;  . inguinal herniography    . NASAL SINUS SURGERY    . RADIOACTIVE SEED GUIDED EXCISIONAL BREAST BIOPSY Right 04/17/2018   Procedure: RIGHT BREAST SEED GUIDED EXCISIONAL BIOPSY;  Surgeon: Rolm Bookbinder, MD;  Location: [facility];  Service: General;  Laterality: Right;  . RE-EXCISION OF BREAST LUMPECTOMY Left 05/15/2018   Procedure: RE-EXCISION OF LEFT BREAST LUMPECTOMY, ASPIRATION OF LEFT AXILLARY SEROMA;  Surgeon: Rolm Bookbinder, MD;  Location: [facility];  Service: General;  Laterality: Left;  . T & A    . TOOTH EXTRACTION    . TOTAL KNEE ARTHROPLASTY Left 09/11/2017   Procedure: LEFT TOTAL KNEE ARTHROPLASTY;  Surgeon: Gaynelle Arabian, MD;  [facility];  Service: Orthopedics;  Laterality: Left;  with block  . US ECHOCARDIOGRAPHY  07/17/2006   EF 55-60%    There were no vitals filed for this visit.   Subjective Assessment - 08/20/18 1519    Subjective  The spot in the armpit is getting worse.  Has been aspirated x 4 right after surgery.  Slowly getting bigger.  I feel like something is in the arm when I set it down.  The pain in it comes and goes.  Also having some redness and pain in the Lt forearm x 4 times.  The redness is there until going to bed.      Patient is accompained by:  Family member    Pertinent History  Patient was diagnosed on 03/08/18 with left grade 1-2 invasive ductal carcinoma breast cancer.It measured 1.5 cm and is located in the upper outer quadrant. It is ER/PR positive and HER2 negative with a Ki67 of < 1%.  She had a left total knee replacement in 10/18 with Dr. Wynelle Link and continues to have pain  in her left lateral knee. Her orthopedist reported it is likely due to her iliotibial band. Patient reports she underwent a left lumpectomy and SNLB (0/4 nodes positive) removed on 04/17/18.. Radiation is now complete     Currently in Pain?  No/denies   intermittent shooting Lt breast pain and Lt arm pain        [facility] PT Assessment - 08/20/18 0001      Assessment   Medical Diagnosis  s/p left lumpectomy and SLNB    Referring Provider  Dr. Donne Hazel    Onset Date/Surgical Date  04/17/18    Hand Dominance  Right    Prior Therapy  yes      Precautions   Precaution Comments  Left arm lymphedema       Restrictions   Weight Bearing Restrictions  No      Balance Screen   Has the patient fallen in the past 6 months  No    Has the patient had a decrease in activity level because of a fear of falling?   No    Is the patient reluctant to leave their home because of a fear of falling?   No      Home Film/video editor residence    Living Arrangements  Spouse/significant other    Available Help at Discharge  Family      Prior Function   Level of [place]  Retired    Leisure  She has not returned to riding her bike      Cognition   Overall Cognitive Status  Within Functional Limits for tasks assessed      Observation/Other Assessments   Observations  visible edema in Lt axilla near SLNB incision; some discoloration and healing blistered skin from radiation       Posture/Postural Control   Posture/Postural Control  Postural limitations    Postural Limitations  Rounded Shoulders;Forward head      AROM   Left Shoulder Flexion  138 Degrees   with pain    Left Shoulder ABduction  150 Degrees   pulling   Left Shoulder Internal Rotation  67 Degrees    Left Shoulder External Rotation  86 Degrees        LYMPHEDEMA/ONCOLOGY QUESTIONNAIRE - 08/20/18 1526      Type   Cancer Type  Left breast      Surgeries   Lumpectomy Date   04/17/18    Sentinel Lymph Node Biopsy Date  04/17/18    Number Lymph Nodes Removed  4      Treatment   Active Chemotherapy Treatment  No    Past  Chemotherapy Treatment  No    Active Radiation Treatment  No    Past Radiation Treatment  Yes    Current Hormone Treatment  No    Past Hormone Therapy  No      Left Upper Extremity Lymphedema   10 cm Proximal to Olecranon Process  31 cm    Olecranon Process  27.2 cm    10 cm Proximal to Ulnar Styloid Process  18.2 cm    Just Proximal to Ulnar Styloid Process  16 cm    Across Hand at [employer]  18.6 cm    At [place] of 2nd Digit  6 cm             Objective measurements completed on examination: See above findings.      [facility] Adult PT Treatment/Exercise - 08/20/18 0001      Manual Therapy   Manual Therapy  Edema management    Edema Management  given 1/2" gray foam for over seroma location    Manual Lymphatic Drainage (MLD)  Short neck, abdominals avoiding Lt lower quadrant due to tender old hernia incision, resisted breathing, Rt and Lt axillary nodes, interaxillary pathway, lt shoulder collectors, work on Lt axillary region and lateral breast in sidelying with arm overhead,then repeating pathways.              PT Education - 08/20/18 1747    Education provided  Yes    Education Details  PT POC    Person(s) Educated  Patient    Methods  Explanation    Comprehension  Verbalized understanding          PT Long Term Goals - 08/20/18 1751      PT LONG TERM GOAL #1   Title  Patient will demonstrate she has returned to baseline related to left shoulder ROM and function including WNL values of flexion and abduction without pulling    Time  4    Period  Weeks    Status  New    Target Date  09/17/18      PT LONG TERM GOAL #2   Title  pt will report a decrease in seroma sensation in the under arm by at least 50%    Time  4    Period  Weeks    Status  New    Target Date  09/17/18      PT LONG TERM GOAL #3    Title  Pt will be independent with self MLD for the Lt lateral breast and seroma region    Time  4    Status  New    Target Date  09/17/18      PT LONG TERM GOAL #4   Title  Pt will be set up with how to obtain compression garments    Time  4    Period  Weeks    Status  New    Target Date  09/17/18             Plan - 08/20/18 1748    Clinical Impression Statement  [name] returns today with increased size and discomfort in the Lt axilla seroma. it is not more visible in seated. She also continues with hardening under both the SLNB and lumpectomy incisions.  her Rt shoulder has decreased in ROM to painful with pulling during flexion and abduction.  Also has some increased circumferential measurements compared to DC.      Rehab Potential  Excellent    PT  Frequency  2x / week    PT Duration  4 weeks    PT Treatment/Interventions  ADLs/Self Care Home Management;Therapeutic activities;Therapeutic exercise;Patient/family education;Manual techniques;Manual lymph drainage;Scar mobilization;Passive range of motion    PT Next Visit Plan  how is gray foam, continue MLD to Lt lateral breast and seroma region, help patient get set up with compression garment and gauntlet most likely through an online vendor due to insurance       Patient will benefit from skilled therapeutic intervention in order to improve the following deficits and impairments:  Decreased knowledge of precautions, Impaired UE functional use, Decreased range of motion, Postural dysfunction, Decreased scar mobility, Decreased mobility, Pain, Increased fascial restricitons, Increased edema  Visit Diagnosis: Abnormal posture - Plan: PT plan of care cert/re-cert  Malignant neoplasm of upper-outer quadrant of left breast in female, estrogen receptor positive ([place]) - Plan: PT plan of care cert/re-cert  Localized edema - Plan: PT plan of care cert/re-cert     Problem List Patient Active Problem List   Diagnosis Date Noted  .  Genetic testing 04/02/2018  . Family history of breast cancer   . Malignant neoplasm of upper-outer quadrant of left female breast ([place])   . Malignant neoplasm of upper-outer quadrant of left breast in female, estrogen receptor positive ([place]) 03/16/2018  . OA (osteoarthritis) of knee 09/11/2017  . Infrapatellar bursitis of right knee 04/29/2014  . Pneumonia, aspiration ([place]) 12/28/2012  . HYPOTHYROIDISM 11/15/2010  . HYPERLIPIDEMIA 11/15/2010  . DEPRESSION 11/15/2010  . GERD 11/15/2010  . CONSTIPATION 11/15/2010  . GALLSTONES 11/15/2010  . RENAL CYST 11/15/2010  . INSOMNIA UNSPECIFIED 11/15/2010  . COUGH 11/15/2010  . ABDOMINAL PAIN OTHER SPECIFIED SITE 11/15/2010  . TRANSAMINASES, SERUM, ELEVATED 11/15/2010    Shan Levans, PT 08/20/2018, 5:54 PM  [place], [place], [address] Phone: [phone]   Fax:  [phone]  Name: CORAL TIMME MRN: [id number] Date of Birth: 02-Feb-1946

## 2018-08-20 NOTE — Patient Instructions (Signed)
Wear gray foam as needed over Rt seroma

## 2018-08-21 ENCOUNTER — Ambulatory Visit: Payer: Medicare Other

## 2018-08-22 ENCOUNTER — Ambulatory Visit: Payer: Medicare Other | Admitting: Rehabilitation

## 2018-08-22 ENCOUNTER — Ambulatory Visit: Payer: Medicare Other

## 2018-08-23 ENCOUNTER — Encounter: Payer: Self-pay | Admitting: Rehabilitation

## 2018-08-23 ENCOUNTER — Ambulatory Visit: Payer: Medicare Other | Admitting: Rehabilitation

## 2018-08-23 ENCOUNTER — Ambulatory Visit: Payer: Medicare Other

## 2018-08-23 DIAGNOSIS — C50412 Malignant neoplasm of upper-outer quadrant of left female breast: Secondary | ICD-10-CM | POA: Diagnosis not present

## 2018-08-23 DIAGNOSIS — M25661 Stiffness of right knee, not elsewhere classified: Secondary | ICD-10-CM | POA: Diagnosis not present

## 2018-08-23 DIAGNOSIS — R600 Localized edema: Secondary | ICD-10-CM | POA: Diagnosis not present

## 2018-08-23 DIAGNOSIS — G5782 Other specified mononeuropathies of left lower limb: Secondary | ICD-10-CM | POA: Diagnosis not present

## 2018-08-23 DIAGNOSIS — Z170 Estrogen receptor positive status [ER+]: Secondary | ICD-10-CM

## 2018-08-23 DIAGNOSIS — R293 Abnormal posture: Secondary | ICD-10-CM | POA: Diagnosis not present

## 2018-08-23 DIAGNOSIS — M25612 Stiffness of left shoulder, not elsewhere classified: Secondary | ICD-10-CM | POA: Diagnosis not present

## 2018-08-23 DIAGNOSIS — R531 Weakness: Secondary | ICD-10-CM | POA: Diagnosis not present

## 2018-08-23 DIAGNOSIS — R262 Difficulty in walking, not elsewhere classified: Secondary | ICD-10-CM | POA: Diagnosis not present

## 2018-08-23 DIAGNOSIS — R2689 Other abnormalities of gait and mobility: Secondary | ICD-10-CM

## 2018-08-23 DIAGNOSIS — M79602 Pain in left arm: Secondary | ICD-10-CM | POA: Diagnosis not present

## 2018-08-23 NOTE — Therapy (Signed)
[place], [place], [address] Phone: [phone]   Fax:  [phone]  Physical Therapy Treatment  Patient Details  Name: [name] MRN: [id number] Date of Birth: [birth year] Referring Provider: Dr. Donne Hazel   Encounter Date: 08/23/2018  PT End of Session - 08/23/18 1510    Visit Number  3    Number of Visits  9    Date for PT Re-Evaluation  09/17/18    Authorization Type  medicare/tricare    PT Start Time  1430    PT Stop Time  1510    PT Time Calculation (min)  40 min    Activity Tolerance  Patient tolerated treatment well    Behavior During Therapy  [facility] for tasks assessed/performed       Past Medical History:  Diagnosis Date  . Arthritis    hands and knees  . Complication of anesthesia    aspiration pna; following a colonoscopy, pt requests head of bed elevated if possible.  . Constipation   . Cough   . Depression   . Dysrhythmia    RBBB on 06-06-17 ekg   . Elevated liver function tests   . Esophageal stricture   . Family history of breast cancer   . Gallstones   . Genetic testing 04/02/2018   STAT Breast panel with reflex to Multi-Cancer panel (83 genes) @ [facility] - No pathogenic mutations detected  . GERD (gastroesophageal reflux disease)   . Hiatal hernia   . History of kidney stones   . Hyperlipemia   . Hypothyroidism   . Insomnia   . Malignant neoplasm of upper-outer quadrant of left female breast ([place])   . Nephrolithiasis   . Pneumonia 2015   aspirated after colonosopy  . Renal cyst     Past Surgical History:  Procedure Laterality Date  . BREAST LUMPECTOMY WITH RADIOACTIVE SEED AND SENTINEL LYMPH NODE BIOPSY Left 04/17/2018   Procedure: LEFT BREAST LUMPECTOMY WITH BRACKETED RADIOACTIVE SEEDS AND SENTINEL LYMPH NODE BIOPSY;  Surgeon: Rolm Bookbinder, MD;  Location: [facility];  Service: General;  Laterality: Left;  . BREAST SURGERY Left    Lumpectomy   . CARDIOVASCULAR STRESS TEST  07/19/2006   EF 70%, NO EVIDENCE OF ISCHEMIA  . CHOLECYSTECTOMY  12/30/10  . COLONOSCOPY    . DILATION AND CURETTAGE OF UTERUS    . HYSTEROSCOPY W/D&C N/A 06/07/2018   Procedure: DILATATION AND CURETTAGE /HYSTEROSCOPY;  Surgeon: [name], MD;  Location: [facility];  Service: Gynecology;  Laterality: N/A;  . inguinal herniography    . NASAL SINUS SURGERY    . RADIOACTIVE SEED GUIDED EXCISIONAL BREAST BIOPSY Right 04/17/2018   Procedure: RIGHT BREAST SEED GUIDED EXCISIONAL BIOPSY;  Surgeon: Rolm Bookbinder, MD;  Location: [facility];  Service: General;  Laterality: Right;  . RE-EXCISION OF BREAST LUMPECTOMY Left 05/15/2018   Procedure: RE-EXCISION OF LEFT BREAST LUMPECTOMY, ASPIRATION OF LEFT AXILLARY SEROMA;  Surgeon: Rolm Bookbinder, MD;  Location: [facility];  Service: General;  Laterality: Left;  . T & A    . TOOTH EXTRACTION    . TOTAL KNEE ARTHROPLASTY Left 09/11/2017   Procedure: LEFT TOTAL KNEE ARTHROPLASTY;  Surgeon: Gaynelle Arabian, MD;  [facility];  Service: Orthopedics;  Laterality: Left;  with block  . US ECHOCARDIOGRAPHY  07/17/2006   EF 55-60%    There were no vitals filed for this visit.  Subjective Assessment - 08/23/18 1429    Subjective  felt better after last time.  foam was ok     Pertinent History  Patient was diagnosed on 03/08/18 with left grade 1-2 invasive ductal carcinoma breast cancer.It measured 1.5 cm and is located in the upper outer quadrant. It is ER/PR positive and HER2 negative with a Ki67 of < 1%.  She had a left total knee replacement in 10/18 with Dr. Wynelle Link and continues to have pain in her left lateral knee. Her orthopedist reported it is likely due to her iliotibial band. Patient reports she underwent a left lumpectomy and SNLB (0/4 nodes positive) removed on 04/17/18.. Radiation is now complete     Currently in Pain?  No/denies                                     PT Long Term Goals - 08/23/18 1512      PT LONG TERM GOAL #1   Title  Patient will demonstrate she has returned to baseline related to left shoulder ROM and function including WNL values of flexion and abduction without pulling    Status  On-going      PT LONG TERM GOAL #2   Title  pt will report a decrease in seroma sensation in the under arm by at least 50%    Status  On-going      PT LONG TERM GOAL #3   Title  Pt will be independent with self MLD for the Lt lateral breast and seroma region    Status  On-going      PT LONG TERM GOAL #4   Title  Pt will be set up with how to obtain compression garments    Status  On-going            Plan - 08/23/18 1510    Clinical [place] returns thinking that she is feeling much better.  The foam has been helping as well as the MLD.  Continued with MLD of the L axilla and lateral breast with the seroma more flat today.      PT Treatment/Interventions  ADLs/Self Care Home Management;Therapeutic activities;Therapeutic exercise;Patient/family education;Manual techniques;Manual lymph drainage;Scar mobilization;Passive range of motion    PT Next Visit Plan  set up compression next time (show her what to order on compression guru)continue MLD to Lt lateral breast and seroma region, help patient get set up with compression garment and gauntlet most likely through an online vendor due to insurance, does she still have prescription or a copy in the folder?     Consulted and Agree with Plan of Care  Patient       Patient will benefit from skilled therapeutic intervention in order to improve the following deficits and impairments:  Decreased knowledge of precautions, Impaired UE functional use, Decreased range of motion, Postural dysfunction, Decreased scar mobility, Decreased mobility, Pain, Increased fascial restricitons, Increased edema  Visit Diagnosis: Abnormal  posture  Malignant neoplasm of upper-outer quadrant of left breast in female, estrogen receptor positive ([place])  Other abnormalities of gait and mobility     Problem List Patient Active Problem List   Diagnosis Date Noted  . Genetic testing 04/02/2018  . Family history of breast cancer   . Malignant neoplasm of upper-outer quadrant of left female breast ([place])   . Malignant neoplasm of upper-outer quadrant of left breast in female, estrogen receptor positive ([place]) 03/16/2018  . OA (osteoarthritis) of knee 09/11/2017  .  Infrapatellar bursitis of right knee 04/29/2014  . Pneumonia, aspiration ([place]) 12/28/2012  . HYPOTHYROIDISM 11/15/2010  . HYPERLIPIDEMIA 11/15/2010  . DEPRESSION 11/15/2010  . GERD 11/15/2010  . CONSTIPATION 11/15/2010  . GALLSTONES 11/15/2010  . RENAL CYST 11/15/2010  . INSOMNIA UNSPECIFIED 11/15/2010  . COUGH 11/15/2010  . ABDOMINAL PAIN OTHER SPECIFIED SITE 11/15/2010  . TRANSAMINASES, SERUM, ELEVATED 11/15/2010    Shan Levans, PT 08/23/2018, 3:12 PM  [place], [place], [address] Phone: [phone]   Fax:  [phone]  Name: [name] MRN: [id number] Date of Birth: 1946-04-04

## 2018-08-27 DIAGNOSIS — R531 Weakness: Secondary | ICD-10-CM | POA: Diagnosis not present

## 2018-08-27 DIAGNOSIS — G5782 Other specified mononeuropathies of left lower limb: Secondary | ICD-10-CM | POA: Diagnosis not present

## 2018-08-27 DIAGNOSIS — R262 Difficulty in walking, not elsewhere classified: Secondary | ICD-10-CM | POA: Diagnosis not present

## 2018-08-27 DIAGNOSIS — M25661 Stiffness of right knee, not elsewhere classified: Secondary | ICD-10-CM | POA: Diagnosis not present

## 2018-08-28 ENCOUNTER — Ambulatory Visit: Payer: Medicare Other | Admitting: Physical Therapy

## 2018-08-28 DIAGNOSIS — M25612 Stiffness of left shoulder, not elsewhere classified: Secondary | ICD-10-CM | POA: Diagnosis not present

## 2018-08-28 DIAGNOSIS — R600 Localized edema: Secondary | ICD-10-CM | POA: Diagnosis not present

## 2018-08-28 DIAGNOSIS — Z170 Estrogen receptor positive status [ER+]: Secondary | ICD-10-CM | POA: Diagnosis not present

## 2018-08-28 DIAGNOSIS — M79602 Pain in left arm: Secondary | ICD-10-CM

## 2018-08-28 DIAGNOSIS — C50412 Malignant neoplasm of upper-outer quadrant of left female breast: Secondary | ICD-10-CM | POA: Diagnosis not present

## 2018-08-28 DIAGNOSIS — R293 Abnormal posture: Secondary | ICD-10-CM | POA: Diagnosis not present

## 2018-08-28 NOTE — Therapy (Signed)
[place], [place], [address] Phone: [phone]   Fax:  [phone]  Physical Therapy Treatment  Patient Details  Name: [name] MRN: [id number] Date of Birth: [birth year] Referring Provider: Dr. Donne Hazel   Encounter Date: 08/28/2018  PT End of Session - 08/28/18 1652    Visit Number  3    Number of Visits  9    Date for PT Re-Evaluation  09/17/18    Authorization Type  medicare/tricare    PT Start Time  1600    PT Stop Time  1645    PT Time Calculation (min)  45 min    Activity Tolerance  Patient tolerated treatment well    Behavior During Therapy  [facility] for tasks assessed/performed       Past Medical History:  Diagnosis Date  . Arthritis    hands and knees  . Complication of anesthesia    aspiration pna; following a colonoscopy, pt requests head of bed elevated if possible.  . Constipation   . Cough   . Depression   . Dysrhythmia    RBBB on 06-06-17 ekg   . Elevated liver function tests   . Esophageal stricture   . Family history of breast cancer   . Gallstones   . Genetic testing 04/02/2018   STAT Breast panel with reflex to Multi-Cancer panel (83 genes) @ [facility] - No pathogenic mutations detected  . GERD (gastroesophageal reflux disease)   . Hiatal hernia   . History of kidney stones   . Hyperlipemia   . Hypothyroidism   . Insomnia   . Malignant neoplasm of upper-outer quadrant of left female breast ([place])   . Nephrolithiasis   . Pneumonia 2015   aspirated after colonosopy  . Renal cyst     Past Surgical History:  Procedure Laterality Date  . BREAST LUMPECTOMY WITH RADIOACTIVE SEED AND SENTINEL LYMPH NODE BIOPSY Left 04/17/2018   Procedure: LEFT BREAST LUMPECTOMY WITH BRACKETED RADIOACTIVE SEEDS AND SENTINEL LYMPH NODE BIOPSY;  Surgeon: Rolm Bookbinder, MD;  Location: [facility];  Service: General;  Laterality: Left;  . BREAST SURGERY Left    Lumpectomy   . CARDIOVASCULAR STRESS TEST  07/19/2006   EF 70%, NO EVIDENCE OF ISCHEMIA  . CHOLECYSTECTOMY  12/30/10  . COLONOSCOPY    . DILATION AND CURETTAGE OF UTERUS    . HYSTEROSCOPY W/D&C N/A 06/07/2018   Procedure: DILATATION AND CURETTAGE /HYSTEROSCOPY;  Surgeon: [name], MD;  Location: [facility];  Service: Gynecology;  Laterality: N/A;  . inguinal herniography    . NASAL SINUS SURGERY    . RADIOACTIVE SEED GUIDED EXCISIONAL BREAST BIOPSY Right 04/17/2018   Procedure: RIGHT BREAST SEED GUIDED EXCISIONAL BIOPSY;  Surgeon: Rolm Bookbinder, MD;  Location: [facility];  Service: General;  Laterality: Right;  . RE-EXCISION OF BREAST LUMPECTOMY Left 05/15/2018   Procedure: RE-EXCISION OF LEFT BREAST LUMPECTOMY, ASPIRATION OF LEFT AXILLARY SEROMA;  Surgeon: Rolm Bookbinder, MD;  Location: [facility];  Service: General;  Laterality: Left;  . T & A    . TOOTH EXTRACTION    . TOTAL KNEE ARTHROPLASTY Left 09/11/2017   Procedure: LEFT TOTAL KNEE ARTHROPLASTY;  Surgeon: Gaynelle Arabian, MD;  [facility];  Service: Orthopedics;  Laterality: Left;  with block  . US ECHOCARDIOGRAPHY  07/17/2006   EF 55-60%    There were no vitals filed for this visit.  [facility] Adult PT Treatment/Exercise - 08/28/18 0001      Exercises   Exercises  Lumbar;Other Exercises      Lumbar Exercises: Stretches   Lower Trunk Rotation  5 reps      Shoulder Exercises: Supine   Flexion  AROM;Left;5 reps      Shoulder Exercises: Sidelying   ABduction  Left;5 reps   also, 5 circles in each direction     Manual Therapy   Manual Lymphatic Drainage (MLD)  Short neck, abdominals avoiding Lt lower quadrant due to tender old hernia incision, resisted breathing, Rt and Lt axillary nodes, interaxillary pathway, lt shoulder collectors, work on Lt axillary region and lateral breast in sidelying with arm overhead,then repeating pathways.                    PT Long Term Goals - 08/23/18 1512      PT LONG TERM GOAL #1   Title  Patient will demonstrate she has returned to baseline related to left shoulder ROM and function including WNL values of flexion and abduction without pulling    Status  On-going      PT LONG TERM GOAL #2   Title  pt will report a decrease in seroma sensation in the under arm by at least 50%    Status  On-going      PT LONG TERM GOAL #3   Title  Pt will be independent with self MLD for the Lt lateral breast and seroma region    Status  On-going      PT LONG TERM GOAL #4   Title  Pt will be set up with how to obtain compression garments    Status  On-going            Plan - 08/28/18 1649    Clinical Impression Statement  [name] continues to have tenderness and fullness in left breast execially near incisions.  There is flattening especially around the seromas with MLD     PT Next Visit Plan  send prescription for sleeve?? set up compression next time (show her what to order on compression guru)continue MLD to Lt lateral breast and seroma region, help patient get set up with compression garment and gauntlet most likely through an online vendor due to insurance,     Consulted and Agree with Plan of Care  Patient       Patient will benefit from skilled therapeutic intervention in order to improve the following deficits and impairments:  Decreased knowledge of precautions, Impaired UE functional use, Decreased range of motion, Postural dysfunction, Decreased scar mobility, Decreased mobility, Pain, Increased fascial restricitons, Increased edema  Visit Diagnosis: Abnormal posture  Localized edema  Stiffness of left shoulder, not elsewhere classified  Pain in left arm     Problem List Patient Active Problem List   Diagnosis Date Noted  . Genetic testing 04/02/2018  . Family history of breast cancer   . Malignant neoplasm of upper-outer quadrant of left female breast ([place])   .  Malignant neoplasm of upper-outer quadrant of left breast in female, estrogen receptor positive ([place]) 03/16/2018  . OA (osteoarthritis) of knee 09/11/2017  . Infrapatellar bursitis of right knee 04/29/2014  . Pneumonia, aspiration ([place]) 12/28/2012  . HYPOTHYROIDISM 11/15/2010  . HYPERLIPIDEMIA 11/15/2010  . DEPRESSION 11/15/2010  . GERD 11/15/2010  . CONSTIPATION 11/15/2010  . GALLSTONES 11/15/2010  . RENAL CYST 11/15/2010  . INSOMNIA UNSPECIFIED 11/15/2010  . COUGH 11/15/2010  . ABDOMINAL PAIN OTHER SPECIFIED  SITE 11/15/2010  . TRANSAMINASES, SERUM, ELEVATED 11/15/2010   Donato Heinz. Owens Shark PT  Norwood Levo 08/28/2018, 4:53 PM  [place], [place], [address] Phone: [phone]   Fax:  [phone]  Name: [name] MRN: [id number] Date of Birth: November 15, 1946

## 2018-08-30 DIAGNOSIS — G5782 Other specified mononeuropathies of left lower limb: Secondary | ICD-10-CM | POA: Diagnosis not present

## 2018-08-30 DIAGNOSIS — R262 Difficulty in walking, not elsewhere classified: Secondary | ICD-10-CM | POA: Diagnosis not present

## 2018-08-30 DIAGNOSIS — M25661 Stiffness of right knee, not elsewhere classified: Secondary | ICD-10-CM | POA: Diagnosis not present

## 2018-08-30 DIAGNOSIS — R531 Weakness: Secondary | ICD-10-CM | POA: Diagnosis not present

## 2018-08-31 ENCOUNTER — Encounter

## 2018-09-03 ENCOUNTER — Ambulatory Visit: Payer: Medicare Other | Admitting: Rehabilitation

## 2018-09-03 ENCOUNTER — Encounter: Payer: Self-pay | Admitting: Rehabilitation

## 2018-09-03 ENCOUNTER — Encounter: Payer: Self-pay | Admitting: Radiation Oncology

## 2018-09-03 DIAGNOSIS — R293 Abnormal posture: Secondary | ICD-10-CM

## 2018-09-03 DIAGNOSIS — C50412 Malignant neoplasm of upper-outer quadrant of left female breast: Secondary | ICD-10-CM

## 2018-09-03 DIAGNOSIS — M25661 Stiffness of right knee, not elsewhere classified: Secondary | ICD-10-CM | POA: Diagnosis not present

## 2018-09-03 DIAGNOSIS — R600 Localized edema: Secondary | ICD-10-CM | POA: Diagnosis not present

## 2018-09-03 DIAGNOSIS — Z170 Estrogen receptor positive status [ER+]: Secondary | ICD-10-CM

## 2018-09-03 DIAGNOSIS — R531 Weakness: Secondary | ICD-10-CM | POA: Diagnosis not present

## 2018-09-03 DIAGNOSIS — G5782 Other specified mononeuropathies of left lower limb: Secondary | ICD-10-CM | POA: Diagnosis not present

## 2018-09-03 DIAGNOSIS — M79602 Pain in left arm: Secondary | ICD-10-CM | POA: Diagnosis not present

## 2018-09-03 DIAGNOSIS — M25612 Stiffness of left shoulder, not elsewhere classified: Secondary | ICD-10-CM | POA: Diagnosis not present

## 2018-09-03 DIAGNOSIS — R262 Difficulty in walking, not elsewhere classified: Secondary | ICD-10-CM | POA: Diagnosis not present

## 2018-09-03 NOTE — Therapy (Signed)
[place], [place], [address] Phone: [phone]   Fax:  [phone]  Physical Therapy Treatment  Patient Details  Name: [name] MRN: [id number] Date of Birth: [birth year] Referring Provider: Dr. Donne Hazel   Encounter Date: 09/03/2018  PT End of Session - 09/03/18 1557    Visit Number  4    Number of Visits  9    Date for PT Re-Evaluation  09/17/18    Authorization Type  medicare/tricare    PT Start Time  1527   pt arriving late   PT Stop Time  1558    PT Time Calculation (min)  31 min    Activity Tolerance  Patient tolerated treatment well    Behavior During Therapy  [facility] for tasks assessed/performed       Past Medical History:  Diagnosis Date  . Arthritis    hands and knees  . Complication of anesthesia    aspiration pna; following a colonoscopy, pt requests head of bed elevated if possible.  . Constipation   . Cough   . Depression   . Dysrhythmia    RBBB on 06-06-17 ekg   . Elevated liver function tests   . Esophageal stricture   . Family history of breast cancer   . Gallstones   . Genetic testing 04/02/2018   STAT Breast panel with reflex to Multi-Cancer panel (83 genes) @ [facility] - No pathogenic mutations detected  . GERD (gastroesophageal reflux disease)   . Hiatal hernia   . History of kidney stones   . History of radiation therapy 07/09/2018- 08/03/18   Left Breast/ 40.05 Gy in 15 fractions. Left Breast boost 10 Gy in 5 fractions.   . Hyperlipemia   . Hypothyroidism   . Insomnia   . Malignant neoplasm of upper-outer quadrant of left female breast ([place])   . Nephrolithiasis   . Pneumonia 2015   aspirated after colonosopy  . Renal cyst     Past Surgical History:  Procedure Laterality Date  . BREAST LUMPECTOMY WITH RADIOACTIVE SEED AND SENTINEL LYMPH NODE BIOPSY Left 04/17/2018   Procedure: LEFT BREAST LUMPECTOMY WITH BRACKETED RADIOACTIVE SEEDS AND SENTINEL LYMPH NODE  BIOPSY;  Surgeon: Rolm Bookbinder, MD;  Location: [place];  Service: General;  Laterality: Left;  . BREAST SURGERY Left    Lumpectomy  . CARDIOVASCULAR STRESS TEST  07/19/2006   EF 70%, NO EVIDENCE OF ISCHEMIA  . CHOLECYSTECTOMY  12/30/10  . COLONOSCOPY    . DILATION AND CURETTAGE OF UTERUS    . HYSTEROSCOPY W/D&C N/A 06/07/2018   Procedure: DILATATION AND CURETTAGE /HYSTEROSCOPY;  Surgeon: [name], MD;  Location: [facility];  Service: Gynecology;  Laterality: N/A;  . inguinal herniography    . NASAL SINUS SURGERY    . RADIOACTIVE SEED GUIDED EXCISIONAL BREAST BIOPSY Right 04/17/2018   Procedure: RIGHT BREAST SEED GUIDED EXCISIONAL BIOPSY;  Surgeon: Rolm Bookbinder, MD;  Location: [place];  Service: General;  Laterality: Right;  . RE-EXCISION OF BREAST LUMPECTOMY Left 05/15/2018   Procedure: RE-EXCISION OF LEFT BREAST LUMPECTOMY, ASPIRATION OF LEFT AXILLARY SEROMA;  Surgeon: Rolm Bookbinder, MD;  Location: [place];  Service: General;  Laterality: Left;  . T & A    . TOOTH EXTRACTION    . TOTAL KNEE ARTHROPLASTY Left 09/11/2017   Procedure: LEFT TOTAL KNEE ARTHROPLASTY;  Surgeon: Gaynelle Arabian, MD;  Location: [facility];  Service: Orthopedics;  Laterality: Left;  with block  .  US ECHOCARDIOGRAPHY  07/17/2006   EF 55-60%    There were no vitals filed for this visit.  Subjective Assessment - 09/03/18 1527    Subjective  The breast is ok.  It has pain in it still when you touch it.      Patient is accompained by:  Family member    Pertinent History  Patient was diagnosed on 03/08/18 with left grade 1-2 invasive ductal carcinoma breast cancer.It measured 1.5 cm and is located in the upper outer quadrant. It is ER/PR positive and HER2 negative with a Ki67 of < 1%.  She had a left total knee replacement in 10/18 with Dr. Wynelle Link and continues to have pain in her left lateral knee. Her orthopedist reported it is likely due to her iliotibial  band. Patient reports she underwent a left lumpectomy and SNLB (0/4 nodes positive) removed on 04/17/18.. Radiation is now complete     Patient Stated Goals  Reduce breast swelling     Currently in Pain?  No/denies                       [facility] Adult PT Treatment/Exercise - 09/03/18 0001      Manual Therapy   Soft tissue mobilization  some more aggressive STM and scar massage to the lateral breast tissue with improved feeling post treatment with this addition     Manual Lymphatic Drainage (MLD)  Short neck, abdominals avoiding Lt lower quadrant due to tender old hernia incision, resisted breathing, Rt and Lt axillary nodes, interaxillary pathway, lt shoulder collectors, work on Lt axillary region and lateral breast in sidelying with arm overhead,then repeating pathways.                   PT Long Term Goals - 08/23/18 1512      PT LONG TERM GOAL #1   Title  Patient will demonstrate she has returned to baseline related to left shoulder ROM and function including WNL values of flexion and abduction without pulling    Status  On-going      PT LONG TERM GOAL #2   Title  pt will report a decrease in seroma sensation in the under arm by at least 50%    Status  On-going      PT LONG TERM GOAL #3   Title  Pt will be independent with self MLD for the Lt lateral breast and seroma region    Status  On-going      PT LONG TERM GOAL #4   Title  Pt will be set up with how to obtain compression garments    Status  On-going            Plan - 09/03/18 1558    Clinical Impression Statement  Added more deep tissue release and scar work to the lateral breast with pt reporting this really helped.  She continues to have tenderness and edema in this region but better.     PT Frequency  2x / week    PT Duration  4 weeks    PT Treatment/Interventions  ADLs/Self Care Home Management;Therapeutic activities;Therapeutic exercise;Patient/family education;Manual techniques;Manual  lymph drainage;Scar mobilization;Passive range of motion    PT Next Visit Plan  set up compression next time (show her what to order on compression guru)continue MLD to Lt lateral breast and seroma region, help patient get set up with compression garment and gauntlet most likely through an online vendor due to insurance,  Consulted and Agree with Plan of Care  Patient       Patient will benefit from skilled therapeutic intervention in order to improve the following deficits and impairments:  Decreased knowledge of precautions, Impaired UE functional use, Decreased range of motion, Postural dysfunction, Decreased scar mobility, Decreased mobility, Pain, Increased fascial restricitons, Increased edema  Visit Diagnosis: Abnormal posture  Localized edema  Malignant neoplasm of upper-outer quadrant of left breast in female, estrogen receptor positive ([place])     Problem List Patient Active Problem List   Diagnosis Date Noted  . Genetic testing 04/02/2018  . Family history of breast cancer   . Malignant neoplasm of upper-outer quadrant of left female breast ([place])   . Malignant neoplasm of upper-outer quadrant of left breast in female, estrogen receptor positive ([place]) 03/16/2018  . OA (osteoarthritis) of knee 09/11/2017  . Infrapatellar bursitis of right knee 04/29/2014  . Pneumonia, aspiration ([place]) 12/28/2012  . HYPOTHYROIDISM 11/15/2010  . HYPERLIPIDEMIA 11/15/2010  . DEPRESSION 11/15/2010  . GERD 11/15/2010  . CONSTIPATION 11/15/2010  . GALLSTONES 11/15/2010  . RENAL CYST 11/15/2010  . INSOMNIA UNSPECIFIED 11/15/2010  . COUGH 11/15/2010  . ABDOMINAL PAIN OTHER SPECIFIED SITE 11/15/2010  . TRANSAMINASES, SERUM, ELEVATED 11/15/2010    Shan Levans, PT 09/03/2018, 4:00 PM  [place], [place], [address] Phone: [phone]   Fax:  [phone]  Name: [name] MRN: [id number] Date of Birth:  [birth year]

## 2018-09-03 NOTE — Progress Notes (Signed)
[name] presents for follow up of radiation completed 08/03/2018 to her Left Breast. She saw Dr. Jana Hakim last on 08/07/18 and will see him again on 11/21/18. She will start Tamoxifen on 09/11/18. She will see survivorship on 02/07/19. She continues to have some hyperpigmentation present to her left breast. She is using radiaplex twice daily to her left breast. She will begin a vitamin E containing lotion or oil when the radiaplex is done. She does continue to have some fatigue.   BP 130/79 (BP Location: Right Arm)   Pulse 82   Temp 97.7 F (36.5 C) (Oral)   Ht 5\' 8"  (1.727 m)   Wt 164 lb 9.6 oz (74.7 kg)   SpO2 100% Comment: room air  BMI 25.03 kg/m    Wt Readings from Last 3 Encounters:  09/07/18 164 lb 9.6 oz (74.7 kg)  08/07/18 168 lb 4.8 oz (76.3 kg)  06/05/18 166 lb 12.8 oz (75.7 kg)

## 2018-09-05 DIAGNOSIS — C50412 Malignant neoplasm of upper-outer quadrant of left female breast: Secondary | ICD-10-CM | POA: Diagnosis not present

## 2018-09-06 ENCOUNTER — Encounter: Payer: Self-pay | Admitting: Physical Therapy

## 2018-09-06 ENCOUNTER — Ambulatory Visit: Payer: Medicare Other | Admitting: Physical Therapy

## 2018-09-06 DIAGNOSIS — M79602 Pain in left arm: Secondary | ICD-10-CM | POA: Diagnosis not present

## 2018-09-06 DIAGNOSIS — Z96652 Presence of left artificial knee joint: Secondary | ICD-10-CM | POA: Diagnosis not present

## 2018-09-06 DIAGNOSIS — R600 Localized edema: Secondary | ICD-10-CM

## 2018-09-06 DIAGNOSIS — R293 Abnormal posture: Secondary | ICD-10-CM | POA: Diagnosis not present

## 2018-09-06 DIAGNOSIS — M25612 Stiffness of left shoulder, not elsewhere classified: Secondary | ICD-10-CM | POA: Diagnosis not present

## 2018-09-06 DIAGNOSIS — C50412 Malignant neoplasm of upper-outer quadrant of left female breast: Secondary | ICD-10-CM | POA: Diagnosis not present

## 2018-09-06 DIAGNOSIS — Z170 Estrogen receptor positive status [ER+]: Secondary | ICD-10-CM | POA: Diagnosis not present

## 2018-09-06 NOTE — Therapy (Addendum)
[facility] [address] [place], [place], [address] Phone: [phone]   Fax:  [phone]  Physical Therapy Treatment  Patient Details  Name: [name] MRN: [id number] Date of Birth: 12/27/1945 Referring Provider (PT): Dr. Wakefield   Encounter Date: 09/06/2018  PT End of Session - 09/06/18 1744    Visit Number  5    Date for PT Re-Evaluation  09/17/18    Authorization Type  medicare/tricare    PT Start Time  1430    PT Stop Time  1515    PT Time Calculation (min)  45 min    Activity Tolerance  Patient tolerated treatment well    Behavior During Therapy  WFL for tasks assessed/performed       Past Medical History:  Diagnosis Date  . Arthritis    hands and knees  . Complication of anesthesia    aspiration pna; following a colonoscopy, pt requests head of bed elevated if possible.  . Constipation   . Cough   . Depression   . Dysrhythmia    RBBB on 06-06-17 ekg   . Elevated liver function tests   . Esophageal stricture   . Family history of breast cancer   . Gallstones   . Genetic testing 04/02/2018   STAT Breast panel with reflex to Multi-Cancer panel (83 genes) @ [facility] - No pathogenic mutations detected  . GERD (gastroesophageal reflux disease)   . Hiatal hernia   . History of kidney stones   . History of radiation therapy 07/09/2018- 08/03/18   Left Breast/ 40.05 Gy in 15 fractions. Left Breast boost 10 Gy in 5 fractions.   . Hyperlipemia   . Hypothyroidism   . Insomnia   . Malignant neoplasm of upper-outer quadrant of left female breast ([facility])   . Nephrolithiasis   . Pneumonia 2015   aspirated after colonosopy  . Renal cyst     Past Surgical History:  Procedure Laterality Date  . BREAST LUMPECTOMY WITH RADIOACTIVE SEED AND SENTINEL LYMPH NODE BIOPSY Left 04/17/2018   Procedure: LEFT BREAST LUMPECTOMY WITH BRACKETED RADIOACTIVE SEEDS AND SENTINEL LYMPH NODE BIOPSY;  Surgeon: Wakefield, Matthew,  MD;  Location: [facility];  Service: General;  Laterality: Left;  . BREAST SURGERY Left    Lumpectomy  . CARDIOVASCULAR STRESS TEST  07/19/2006   EF 70%, NO EVIDENCE OF ISCHEMIA  . CHOLECYSTECTOMY  12/30/10  . COLONOSCOPY    . DILATION AND CURETTAGE OF UTERUS    . HYSTEROSCOPY W/D&C N/A 06/07/2018   Procedure: DILATATION AND CURETTAGE /HYSTEROSCOPY;  Surgeon: Grewal, Michelle, MD;  Location: [facility];  Service: Gynecology;  Laterality: N/A;  . inguinal herniography    . NASAL SINUS SURGERY    . RADIOACTIVE SEED GUIDED EXCISIONAL BREAST BIOPSY Right 04/17/2018   Procedure: RIGHT BREAST SEED GUIDED EXCISIONAL BIOPSY;  Surgeon: Wakefield, Matthew, MD;  Location: [facility];  Service: General;  Laterality: Right;  . RE-EXCISION OF BREAST LUMPECTOMY Left 05/15/2018   Procedure: RE-EXCISION OF LEFT BREAST LUMPECTOMY, ASPIRATION OF LEFT AXILLARY SEROMA;  Surgeon: Wakefield, Matthew, MD;  Location: [facility];  Service: General;  Laterality: Left;  . T & A    . TOOTH EXTRACTION    . TOTAL KNEE ARTHROPLASTY Left 09/11/2017   Procedure: LEFT TOTAL KNEE ARTHROPLASTY;  Surgeon: Aluisio, Frank, MD;  Location: [facility];  Service: Orthopedics;  Laterality: Left;  with block  . US ECHOCARDIOGRAPHY  07/17/2006   EF 55-60%      There were no vitals filed for this visit.  Subjective Assessment - 09/06/18 1352    Subjective  Pt found out today that she has to have more knee surgery.  She still has pain in fullness of left breast when you press on it.     Pertinent History  Patient was diagnosed on 03/08/18 with left grade 1-2 invasive ductal carcinoma breast cancer.It measured 1.5 cm and is located in the upper outer quadrant. It is ER/PR positive and HER2 negative with a Ki67 of < 1%.  She had a left total knee replacement in 10/18 with Dr. Wynelle Link and continues to have pain in her left lateral knee. Her orthopedist reported it is likely due to her iliotibial band. Patient reports  she underwent a left lumpectomy and SNLB (0/4 nodes positive) removed on 04/17/18.. Radiation is now complete     Patient Stated Goals  Reduce breast swelling     Currently in Pain?  No/denies                       [facility] Adult PT Treatment/Exercise - 09/06/18 0001      Manual Therapy   Edema Management  chip pack to wear inside bra at full area.  encouraged pt to wear her compression bra.      Manual Lymphatic Drainage (MLD)  Short neck, abdominals avoiding Lt lower quadrant due to tender old hernia incision, resisted breathing, Rt and Lt axillary nodes, interaxillary pathway, lt shoulder collectors, work on Lt axillary region and lateral breast in sidelying with arm overhead,then repeating pathways.                   PT Long Term Goals - 08/23/18 1512      PT LONG TERM GOAL #1   Title  Patient will demonstrate she has returned to baseline related to left shoulder ROM and function including WNL values of flexion and abduction without pulling    Status  On-going      PT LONG TERM GOAL #2   Title  pt will report a decrease in seroma sensation in the under arm by at least 50%    Status  On-going      PT LONG TERM GOAL #3   Title  Pt will be independent with self MLD for the Lt lateral breast and seroma region    Status  On-going      PT LONG TERM GOAL #4   Title  Pt will be set up with how to obtain compression garments    Status  On-going            Plan - 09/06/18 1745    Clinical Impression Statement  Pt continues to have localized fullness in left breat at area of incisional scar.  Encouraged her to use chip pack an compression bra.  She only has one compression bra as one has a malfunctioned zipper. Will try to contact [facility] to see if she can get a replacement     PT Frequency  2x / week    PT Duration  4 weeks    PT Treatment/Interventions  ADLs/Self Care Home Management;Therapeutic activities;Therapeutic exercise;Patient/family  education;Manual techniques;Manual lymph drainage;Scar mobilization;Passive range of motion    PT Next Visit Plan  set up compression next time (show her what to order on compression guru)continue MLD to Lt lateral breast and seroma region, help patient get set up with compression garment and gauntlet most likely through an  online vendor due to insurance,        Patient will benefit from skilled therapeutic intervention in order to improve the following deficits and impairments:  Decreased knowledge of precautions, Impaired UE functional use, Decreased range of motion, Postural dysfunction, Decreased scar mobility, Decreased mobility, Pain, Increased fascial restricitons, Increased edema  Visit Diagnosis: Abnormal posture  Localized edema  Pain in left arm     Problem List Patient Active Problem List   Diagnosis Date Noted  . Genetic testing 04/02/2018  . Family history of breast cancer   . Malignant neoplasm of upper-outer quadrant of left female breast ([place])   . Malignant neoplasm of upper-outer quadrant of left breast in female, estrogen receptor positive ([place]) 03/16/2018  . OA (osteoarthritis) of knee 09/11/2017  . Infrapatellar bursitis of right knee 04/29/2014  . Pneumonia, aspiration ([place]) 12/28/2012  . HYPOTHYROIDISM 11/15/2010  . HYPERLIPIDEMIA 11/15/2010  . DEPRESSION 11/15/2010  . GERD 11/15/2010  . CONSTIPATION 11/15/2010  . GALLSTONES 11/15/2010  . RENAL CYST 11/15/2010  . INSOMNIA UNSPECIFIED 11/15/2010  . COUGH 11/15/2010  . ABDOMINAL PAIN OTHER SPECIFIED SITE 11/15/2010  . TRANSAMINASES, SERUM, ELEVATED 11/15/2010   Donato Heinz. Owens Shark PT  Norwood Levo 09/06/2018, 5:48 PM  [place], [place], [address] Phone: [phone]   Fax:  [phone]  Name: [name] MRN: [id number] Date of Birth: 06-02-1946   Addendum to note on 09/07/2018:  [place] and  they will be able to replace her compression bra.  They said they will contact pt to set up the appointment for replacement  Maudry Diego, PT 09/07/18 12:30 PM

## 2018-09-07 ENCOUNTER — Encounter: Payer: Self-pay | Admitting: Radiation Oncology

## 2018-09-07 ENCOUNTER — Ambulatory Visit
Admission: RE | Admit: 2018-09-07 | Discharge: 2018-09-07 | Disposition: A | Discharge status: Home / Self Care | Payer: Medicare Other | Source: Ambulatory Visit | Attending: Radiation Oncology | Admitting: Radiation Oncology

## 2018-09-07 ENCOUNTER — Other Ambulatory Visit: Payer: Self-pay

## 2018-09-07 VITALS — BP 130/79 | HR 82 | Temp 97.7°F | Ht 68.0 in | Wt 164.6 lb

## 2018-09-07 DIAGNOSIS — R531 Weakness: Secondary | ICD-10-CM | POA: Diagnosis not present

## 2018-09-07 DIAGNOSIS — Z885 Allergy status to narcotic agent status: Secondary | ICD-10-CM | POA: Insufficient documentation

## 2018-09-07 DIAGNOSIS — Z79899 Other long term (current) drug therapy: Secondary | ICD-10-CM | POA: Diagnosis not present

## 2018-09-07 DIAGNOSIS — M25661 Stiffness of right knee, not elsewhere classified: Secondary | ICD-10-CM | POA: Diagnosis not present

## 2018-09-07 DIAGNOSIS — Z923 Personal history of irradiation: Secondary | ICD-10-CM | POA: Diagnosis not present

## 2018-09-07 DIAGNOSIS — G5782 Other specified mononeuropathies of left lower limb: Secondary | ICD-10-CM | POA: Diagnosis not present

## 2018-09-07 DIAGNOSIS — Z170 Estrogen receptor positive status [ER+]: Secondary | ICD-10-CM | POA: Insufficient documentation

## 2018-09-07 DIAGNOSIS — R262 Difficulty in walking, not elsewhere classified: Secondary | ICD-10-CM | POA: Diagnosis not present

## 2018-09-07 DIAGNOSIS — C50412 Malignant neoplasm of upper-outer quadrant of left female breast: Secondary | ICD-10-CM | POA: Insufficient documentation

## 2018-09-07 HISTORY — DX: Personal history of irradiation: Z92.3

## 2018-09-07 NOTE — Progress Notes (Signed)
Radiation Oncology         [phone] ________________________________  Name: [name] MRN: [id number]  Date: 09/07/2018  DOB: August 05, 1946  Follow-Up Visit Note  Outpatient  CC: Shon Baton, MD  Rolm Bookbinder, MD  Diagnosis and Prior Radiotherapy:    ICD-10-CM   1. Malignant neoplasm of upper-outer quadrant of left breast in female, estrogen receptor positive ([place]) C50.412    Z17.0     StageT1cN0M0LeftBreast UOQ Invasive Ductal Carcinomawith DCIS, ER+/ PR+/ Her2-, Grade 2  Cancer Staging Malignant neoplasm of upper-outer quadrant of left breast in female, estrogen receptor positive ([place]) Staging form: Breast, AJCC 8th Edition - Clinical stage from 03/21/2018: Stage IA (cT1c, cN0, cM0, G2, ER+, PR+, HER2-) - Unsigned - Pathologic: Stage IA (pT1c, pN0, cM0, G2, ER+, PR+, HER2-) - Unsigned  07/09/2018 - 08/03/2018:  Left Breast / 40.05 Gy in 15 fractions Left Breast Boost / 10 Gy in 5 fractions  CHIEF COMPLAINT: Here for follow-up and surveillance of left breast cancer  Narrative:  The patient returns today for routine follow-up of radiation completed 08/03/2018 to her left breast. She saw Dr. Jana Hakim last on 08/07/2018 and is scheduled to see him again on 11/21/2018. She will start Tamoxifen on 09/11/2018. She is scheduled to meet with survivorship on 02/07/2019.   She continues to have some hyperpigmentation present to her left breast. She is using radiaplex twice daily to her left breast. She will begin using a vitamin E containing lotion or oil when the radiaplex is done. She does continue to have some fatigue.                  ALLERGIES:  is allergic to oxycontin [oxycodone hcl] and codeine.  Meds: Current Outpatient Medications  Medication Sig Dispense Refill  . acetaminophen (TYLENOL) 500 MG tablet Take 500 mg by mouth every 6 (six) hours as needed for mild pain or headache.    Marland Kitchen azelastine (ASTELIN) 0.1 % nasal spray Place 1 spray into both nostrils 2  (two) times daily. Use in each nostril as directed    . Cholecalciferol (VITAMIN D3) 1000 units CAPS Take 1 capsule by mouth 2 (two) times daily.     Marland Kitchen escitalopram (LEXAPRO) 10 MG tablet Take 15 mg by mouth daily.     Marland Kitchen esomeprazole (NEXIUM) 40 MG capsule Take 40 mg by mouth 2 (two) times daily before a meal.    . ezetimibe (ZETIA) 10 MG tablet Take 10 mg by mouth at bedtime.    . fluticasone (FLONASE) 50 MCG/ACT nasal spray Place 1 spray into both nostrils daily.    Marland Kitchen levothyroxine (SYNTHROID, LEVOTHROID) 50 MCG tablet Take 50 mcg by mouth at bedtime.    . Magnesium 250 MG TABS Take 1 tablet by mouth daily.     . naproxen sodium (ALEVE) 220 MG tablet Take 220 mg by mouth 2 (two) times daily as needed (pain).    Marland Kitchen OVER THE COUNTER MEDICATION Apply 1 application topically daily as needed (pain). Natural pain relief ointment    . Probiotic Product (PROBIOTIC ADVANCED PO) Take 1 tablet by mouth daily.    . rosuvastatin (CRESTOR) 5 MG tablet Take 5 mg by mouth at bedtime.     . sodium chloride (OCEAN) 0.65 % SOLN nasal spray Place 4 sprays into both nostrils 4 (four) times daily.    . SUPER B COMPLEX/C PO Take 1 tablet by mouth daily.    . traMADol (ULTRAM) 50 MG tablet Take 2 tablets (100  mg total) by mouth every 6 (six) hours as needed. 10 tablet 0  . zolpidem (AMBIEN) 10 MG tablet zolpidem 10 mg tablet  take 1 tablet by mouth at bedtime if needed     No current facility-administered medications for this encounter.     Physical Findings: The patient is in no acute distress. Patient is alert and oriented.  height is '5\' 8"'$  (1.727 m) and weight is 164 lb 9.6 oz (74.7 kg). Her oral temperature is 97.7 F (36.5 C). Her blood pressure is 130/79 and her pulse is 82. Her oxygen saturation is 100%. .    Satisfactory skin healing in radiotherapy fields. She has a little residual hyperpigmentation over the left breast.    Lab Findings: Lab Results  Component Value Date   WBC 6.3 08/07/2018   HGB  11.6 08/07/2018   HCT 36.5 08/07/2018   MCV 86.3 08/07/2018   PLT 169 08/07/2018    Radiographic Findings: No results found.  Impression/Plan: Healing well from radiotherapy to the breast tissue.   Continue skin care with topical Vitamin E Oil and / or lotion for at least 2 more months for further healing.  I encouraged her to continue with yearly mammography and followup with medical oncology. I will see her back on an as-needed basis. I have encouraged her to call if she has any issues or concerns in the future. I wished her the very best.  _____________________________________   Eppie Gibson, MD   This document serves as a record of services personally performed by Eppie Gibson, MD. It was created on her behalf by Arlyce Harman, a trained medical scribe. The creation of this record is based on the scribe's personal observations and the provider's statements to them. This document has been checked and approved by the attending provider.

## 2018-09-10 ENCOUNTER — Ambulatory Visit: Payer: Medicare Other | Admitting: Rehabilitation

## 2018-09-10 DIAGNOSIS — R262 Difficulty in walking, not elsewhere classified: Secondary | ICD-10-CM | POA: Diagnosis not present

## 2018-09-10 DIAGNOSIS — M25661 Stiffness of right knee, not elsewhere classified: Secondary | ICD-10-CM | POA: Diagnosis not present

## 2018-09-10 DIAGNOSIS — G5782 Other specified mononeuropathies of left lower limb: Secondary | ICD-10-CM | POA: Diagnosis not present

## 2018-09-10 DIAGNOSIS — R531 Weakness: Secondary | ICD-10-CM | POA: Diagnosis not present

## 2018-09-11 ENCOUNTER — Other Ambulatory Visit: Payer: Self-pay | Admitting: *Deleted

## 2018-09-11 MED ORDER — TAMOXIFEN CITRATE 20 MG PO TABS: 20.0000 mg | ORAL_TABLET | Freq: Every day | ORAL | 3 refills | Status: DC

## 2018-09-12 ENCOUNTER — Other Ambulatory Visit: Payer: Self-pay

## 2018-09-12 ENCOUNTER — Telehealth: Payer: Self-pay

## 2018-09-12 MED ORDER — TAMOXIFEN CITRATE 20 MG PO TABS: 20.0000 mg | ORAL_TABLET | Freq: Every day | ORAL | 3 refills | Status: DC

## 2018-09-12 NOTE — Telephone Encounter (Signed)
Pt husband called to request to have [facility] day [facility] with refills. LVM with call back number. Will notify [facility] to cancel refill.

## 2018-09-13 ENCOUNTER — Ambulatory Visit: Payer: Medicare Other | Attending: General Surgery | Admitting: Physical Therapy

## 2018-09-13 ENCOUNTER — Encounter: Payer: Self-pay | Admitting: Physical Therapy

## 2018-09-13 DIAGNOSIS — M79602 Pain in left arm: Secondary | ICD-10-CM | POA: Insufficient documentation

## 2018-09-13 DIAGNOSIS — R293 Abnormal posture: Secondary | ICD-10-CM | POA: Diagnosis not present

## 2018-09-13 DIAGNOSIS — G5782 Other specified mononeuropathies of left lower limb: Secondary | ICD-10-CM | POA: Diagnosis not present

## 2018-09-13 DIAGNOSIS — M25661 Stiffness of right knee, not elsewhere classified: Secondary | ICD-10-CM | POA: Diagnosis not present

## 2018-09-13 DIAGNOSIS — R600 Localized edema: Secondary | ICD-10-CM | POA: Diagnosis not present

## 2018-09-13 DIAGNOSIS — M25612 Stiffness of left shoulder, not elsewhere classified: Secondary | ICD-10-CM | POA: Diagnosis not present

## 2018-09-13 DIAGNOSIS — R262 Difficulty in walking, not elsewhere classified: Secondary | ICD-10-CM | POA: Diagnosis not present

## 2018-09-13 DIAGNOSIS — R531 Weakness: Secondary | ICD-10-CM | POA: Diagnosis not present

## 2018-09-13 NOTE — Therapy (Signed)
[place], [place], [address] Phone: [phone]   Fax:  [phone]  Physical Therapy Treatment  Patient Details  Name: [name] MRN: [id number] Date of Birth: 01-28-1946 Referring Provider (PT): Dr. Donne Hazel   Encounter Date: 09/13/2018  PT End of Session - 09/13/18 1352    Visit Number  6    Number of Visits  17    Date for PT Re-Evaluation  10/18/18    Authorization Type  medicare/tricare    PT Start Time  1300    PT Stop Time  1345    PT Time Calculation (min)  45 min    Activity Tolerance  Patient tolerated treatment well    Behavior During Therapy  [facility] for tasks assessed/performed       Past Medical History:  Diagnosis Date  . Arthritis    hands and knees  . Complication of anesthesia    aspiration pna; following a colonoscopy, pt requests head of bed elevated if possible.  . Constipation   . Cough   . Depression   . Dysrhythmia    RBBB on 06-06-17 ekg   . Elevated liver function tests   . Esophageal stricture   . Family history of breast cancer   . Gallstones   . Genetic testing 04/02/2018   STAT Breast panel with reflex to Multi-Cancer panel (83 genes) @ [facility] - No pathogenic mutations detected  . GERD (gastroesophageal reflux disease)   . Hiatal hernia   . History of kidney stones   . History of radiation therapy 07/09/2018- 08/03/18   Left Breast/ 40.05 Gy in 15 fractions. Left Breast boost 10 Gy in 5 fractions.   . Hyperlipemia   . Hypothyroidism   . Insomnia   . Malignant neoplasm of upper-outer quadrant of left female breast ([place])   . Nephrolithiasis   . Pneumonia 2015   aspirated after colonosopy  . Renal cyst     Past Surgical History:  Procedure Laterality Date  . BREAST LUMPECTOMY WITH RADIOACTIVE SEED AND SENTINEL LYMPH NODE BIOPSY Left 04/17/2018   Procedure: LEFT BREAST LUMPECTOMY WITH BRACKETED RADIOACTIVE SEEDS AND SENTINEL LYMPH NODE BIOPSY;   Surgeon: Rolm Bookbinder, MD;  Location: [place];  Service: General;  Laterality: Left;  . BREAST SURGERY Left    Lumpectomy  . CARDIOVASCULAR STRESS TEST  07/19/2006   EF 70%, NO EVIDENCE OF ISCHEMIA  . CHOLECYSTECTOMY  12/30/10  . COLONOSCOPY    . DILATION AND CURETTAGE OF UTERUS    . HYSTEROSCOPY W/D&C N/A 06/07/2018   Procedure: DILATATION AND CURETTAGE /HYSTEROSCOPY;  Surgeon: [name], MD;  Location: [facility];  Service: Gynecology;  Laterality: N/A;  . inguinal herniography    . NASAL SINUS SURGERY    . RADIOACTIVE SEED GUIDED EXCISIONAL BREAST BIOPSY Right 04/17/2018   Procedure: RIGHT BREAST SEED GUIDED EXCISIONAL BIOPSY;  Surgeon: Rolm Bookbinder, MD;  Location: [place];  Service: General;  Laterality: Right;  . RE-EXCISION OF BREAST LUMPECTOMY Left 05/15/2018   Procedure: RE-EXCISION OF LEFT BREAST LUMPECTOMY, ASPIRATION OF LEFT AXILLARY SEROMA;  Surgeon: Rolm Bookbinder, MD;  Location: [place];  Service: General;  Laterality: Left;  . T & A    . TOOTH EXTRACTION    . TOTAL KNEE ARTHROPLASTY Left 09/11/2017   Procedure: LEFT TOTAL KNEE ARTHROPLASTY;  Surgeon: Gaynelle Arabian, MD;  Location: [facility];  Service: Orthopedics;  Laterality: Left;  with block  . US ECHOCARDIOGRAPHY  07/17/2006   EF 55-60%    There were no vitals filed for this visit.  Subjective Assessment - 09/13/18 1302    Subjective  Pt was able to get another cmpression bra.  Has only been able to wear a chip pack a little while. She feels fine in the armpit and does feel the sharp pains in the breast every day. Dr. Isidore Moos visit went fine     Pertinent History  Patient was diagnosed on 03/08/18 with left grade 1-2 invasive ductal carcinoma breast cancer.It measured 1.5 cm and is located in the upper outer quadrant. It is ER/PR positive and HER2 negative with a Ki67 of < 1%.  She had a left total knee replacement in 10/18 with Dr. Wynelle Link and continues to have  pain in her left lateral knee. Her orthopedist reported it is likely due to her iliotibial band. Patient reports she underwent a left lumpectomy and SNLB (0/4 nodes positive) removed on 04/17/18.. Radiation is now complete     Patient Stated Goals  Reduce breast swelling     Currently in Pain?  --   intermittently at a 5 or 6        [facility] PT Assessment - 09/13/18 0001      Assessment   Medical Diagnosis  s/p left lumpectomy and SLNB    Referring Provider (PT)  Dr. Donne Hazel    Onset Date/Surgical Date  04/17/18      Prior Function   Level of Independence  Independent                   [facility] Adult PT Treatment/Exercise - 09/13/18 0001      Self-Care   Other Self-Care Comments   encouraged pt to wear compression bra with chip pack at fullness of left breast upper outer quadrant       Exercises   Exercises  Neck;Shoulder      Neck Exercises: Seated   Other Seated Exercise  neck and upper thoracic range of motion       Shoulder Exercises: Sidelying   ABduction  Left;5 reps   deep breath the top for chest expansion     Manual Therapy   Manual Lymphatic Drainage (MLD)  Short neck, abdominals avoiding Lt lower quadrant due to tender old hernia incision, resisted breathing, Rt and Lt axillary nodes, interaxillary pathway, lt shoulder collectors, work on Lt axillary region and lateral breast in sidelying with arm overhead,then repeating pathways.                   PT Long Term Goals - 09/13/18 1358      PT LONG TERM GOAL #1   Title  Patient will demonstrate she has returned to baseline related to left shoulder ROM and function including WNL values of flexion and abduction without pulling    Baseline  06/20/18- 143 flexion, 112 abduction    Time  4    Period  Weeks    Status  On-going      PT LONG TERM GOAL #2   Title  pt will report a decrease in seroma sensation in the under arm by at least 50%    Baseline  she feels fullness in the breast now     Time   4    Period  Weeks    Status  Achieved      PT LONG TERM GOAL #3   Title  Pt will be independent with self MLD for the Lt lateral  breast and seroma region    Time  4    Period  Weeks    Status  On-going      PT LONG TERM GOAL #4   Title  Pt will be set up with how to obtain compression garments    Period  Weeks    Status  On-going            Plan - 09/13/18 1353    Clinical Impression Statement  Pt continues to be limited by knee pain and is looking forward to her arthroscopic surgery on Nov. 7  Fullness in her left brest seems to be better but she continues to have intermittent sharp pains into breast. Encouraged her to wear compression and chip pack at least a few hours a day to assist the body in reabsorbing the exces fluid there  sent renewal today for more sessions until she has her knee surgery as her visits my be limited until then and her symtpoms are not completely resolved     PT Frequency  2x / week    PT Duration  4 weeks    PT Next Visit Plan  continue MLD to Lt lateral breast and seroma region with remedial range of motion exercises to neck upper thoracic spine and trunk.  Prior to discharge  help patient get set up with compression garment and gauntlet most likely through an online vendor due to insurance how her what to order on compression guru)continue       Patient will benefit from skilled therapeutic intervention in order to improve the following deficits and impairments:  Decreased knowledge of precautions, Impaired UE functional use, Decreased range of motion, Postural dysfunction, Decreased scar mobility, Decreased mobility, Pain, Increased fascial restricitons, Increased edema  Visit Diagnosis: Abnormal posture - Plan: PT plan of care cert/re-cert  Localized edema - Plan: PT plan of care cert/re-cert  Pain in left arm - Plan: PT plan of care cert/re-cert  Stiffness of left shoulder, not elsewhere classified - Plan: PT plan of care  cert/re-cert     Problem List Patient Active Problem List   Diagnosis Date Noted  . Genetic testing 04/02/2018  . Family history of breast cancer   . Malignant neoplasm of upper-outer quadrant of left female breast ([place])   . Malignant neoplasm of upper-outer quadrant of left breast in female, estrogen receptor positive ([place]) 03/16/2018  . OA (osteoarthritis) of knee 09/11/2017  . Infrapatellar bursitis of right knee 04/29/2014  . Pneumonia, aspiration ([place]) 12/28/2012  . HYPOTHYROIDISM 11/15/2010  . HYPERLIPIDEMIA 11/15/2010  . DEPRESSION 11/15/2010  . GERD 11/15/2010  . CONSTIPATION 11/15/2010  . GALLSTONES 11/15/2010  . RENAL CYST 11/15/2010  . INSOMNIA UNSPECIFIED 11/15/2010  . COUGH 11/15/2010  . ABDOMINAL PAIN OTHER SPECIFIED SITE 11/15/2010  . TRANSAMINASES, SERUM, ELEVATED 11/15/2010   Donato Heinz. Owens Shark PT  Norwood Levo 09/13/2018, 2:02 PM  [place], [place], [address] Phone: [phone]   Fax:  [phone]  Name: [name] MRN: [id number] Date of Birth: 29-Jun-1946

## 2018-09-17 ENCOUNTER — Encounter: Payer: Medicare Other | Admitting: Rehabilitation

## 2018-09-17 ENCOUNTER — Ambulatory Visit: Payer: Medicare Other | Admitting: Physical Therapy

## 2018-09-20 ENCOUNTER — Encounter: Payer: Medicare Other | Admitting: Physical Therapy

## 2018-09-27 DIAGNOSIS — R262 Difficulty in walking, not elsewhere classified: Secondary | ICD-10-CM | POA: Diagnosis not present

## 2018-09-27 DIAGNOSIS — G5782 Other specified mononeuropathies of left lower limb: Secondary | ICD-10-CM | POA: Diagnosis not present

## 2018-09-27 DIAGNOSIS — M25661 Stiffness of right knee, not elsewhere classified: Secondary | ICD-10-CM | POA: Diagnosis not present

## 2018-09-27 DIAGNOSIS — R531 Weakness: Secondary | ICD-10-CM | POA: Diagnosis not present

## 2018-10-02 ENCOUNTER — Encounter: Payer: Self-pay | Admitting: Physical Therapy

## 2018-10-02 ENCOUNTER — Ambulatory Visit: Payer: Medicare Other | Admitting: Physical Therapy

## 2018-10-02 DIAGNOSIS — R293 Abnormal posture: Secondary | ICD-10-CM | POA: Diagnosis not present

## 2018-10-02 DIAGNOSIS — R600 Localized edema: Secondary | ICD-10-CM

## 2018-10-02 DIAGNOSIS — M25612 Stiffness of left shoulder, not elsewhere classified: Secondary | ICD-10-CM

## 2018-10-02 DIAGNOSIS — M79602 Pain in left arm: Secondary | ICD-10-CM

## 2018-10-02 NOTE — Therapy (Signed)
[place], [place], [address] Phone: [phone]   Fax:  [phone]  Physical Therapy Treatment  Patient Details  Name: [name] MRN: [id number] Date of Birth: Aug 30, 1946 Referring Provider (PT): Dr. Donne Hazel   Encounter Date: 10/02/2018  PT End of Session - 10/02/18 2035    Visit Number  7    Number of Visits  17    Date for PT Re-Evaluation  10/18/18    PT Start Time  1430    PT Stop Time  1515    PT Time Calculation (min)  45 min    Activity Tolerance  Patient tolerated treatment well    Behavior During Therapy  [facility] for tasks assessed/performed       Past Medical History:  Diagnosis Date  . Arthritis    hands and knees  . Complication of anesthesia    aspiration pna; following a colonoscopy, pt requests head of bed elevated if possible.  . Constipation   . Cough   . Depression   . Dysrhythmia    RBBB on 06-06-17 ekg   . Elevated liver function tests   . Esophageal stricture   . Family history of breast cancer   . Gallstones   . Genetic testing 04/02/2018   STAT Breast panel with reflex to Multi-Cancer panel (83 genes) @ [facility] - No pathogenic mutations detected  . GERD (gastroesophageal reflux disease)   . Hiatal hernia   . History of kidney stones   . History of radiation therapy 07/09/2018- 08/03/18   Left Breast/ 40.05 Gy in 15 fractions. Left Breast boost 10 Gy in 5 fractions.   . Hyperlipemia   . Hypothyroidism   . Insomnia   . Malignant neoplasm of upper-outer quadrant of left female breast ([place])   . Nephrolithiasis   . Pneumonia 2015   aspirated after colonosopy  . Renal cyst     Past Surgical History:  Procedure Laterality Date  . BREAST LUMPECTOMY WITH RADIOACTIVE SEED AND SENTINEL LYMPH NODE BIOPSY Left 04/17/2018   Procedure: LEFT BREAST LUMPECTOMY WITH BRACKETED RADIOACTIVE SEEDS AND SENTINEL LYMPH NODE BIOPSY;  Surgeon: Rolm Bookbinder, MD;  Location:  [place];  Service: General;  Laterality: Left;  . BREAST SURGERY Left    Lumpectomy  . CARDIOVASCULAR STRESS TEST  07/19/2006   EF 70%, NO EVIDENCE OF ISCHEMIA  . CHOLECYSTECTOMY  12/30/10  . COLONOSCOPY    . DILATION AND CURETTAGE OF UTERUS    . HYSTEROSCOPY W/D&C N/A 06/07/2018   Procedure: DILATATION AND CURETTAGE /HYSTEROSCOPY;  Surgeon: [name], MD;  Location: [facility];  Service: Gynecology;  Laterality: N/A;  . inguinal herniography    . NASAL SINUS SURGERY    . RADIOACTIVE SEED GUIDED EXCISIONAL BREAST BIOPSY Right 04/17/2018   Procedure: RIGHT BREAST SEED GUIDED EXCISIONAL BIOPSY;  Surgeon: Rolm Bookbinder, MD;  Location: [place] [facility];  Service: General;  Laterality: Right;  . RE-EXCISION OF BREAST LUMPECTOMY Left 05/15/2018   Procedure: RE-EXCISION OF LEFT BREAST LUMPECTOMY, ASPIRATION OF LEFT AXILLARY SEROMA;  Surgeon: Rolm Bookbinder, MD;  Location: [facility];  Service: General;  Laterality: Left;  . T & A    . TOOTH EXTRACTION    . TOTAL KNEE ARTHROPLASTY Left 09/11/2017   Procedure: LEFT TOTAL KNEE ARTHROPLASTY;  Surgeon: Gaynelle Arabian, MD;  Location: [facility];  Service: Orthopedics;  Laterality: Left;  with block  . US ECHOCARDIOGRAPHY  07/17/2006   EF 55-60%  There were no vitals filed for this visit.  Subjective Assessment - 10/02/18 1433    Subjective  Pt states she is doing ok.  She stilll has some pain in her left breast and under her arm occasionally.  She is waiting to have her knee surgery in 2 weeks. ( Nov. 5) She wears she compression bra all the time     Currently in Pain?  Yes    Pain Score  2     Pain Location  Axilla    Pain Orientation  Left    Pain Descriptors / Indicators  Aching;Sore                       [facility] Adult PT Treatment/Exercise - 10/02/18 0001      Manual Therapy   Manual Therapy  Taping    Edema Management  small dotted foam to left breast and axilla     Manual  Lymphatic Drainage (MLD)  Short neck, superficial and deep abdominal, lef inguinal nodes. and left axillo inguinal anasatamosis, anteriorn ineraxillary anastamosis, let breast and axillla    Passive ROM  to left shoulder.  pt with pain and  tightness    Kinesiotex  Edema      Kinesiotix   Edema  fan shape across posterior interaxillary anastamosis avoiding radiation field, skinkote first                   PT Long Term Goals - 09/13/18 1358      PT LONG TERM GOAL #1   Title  Patient will demonstrate she has returned to baseline related to left shoulder ROM and function including WNL values of flexion and abduction without pulling    Baseline  06/20/18- 143 flexion, 112 abduction    Time  4    Period  Weeks    Status  On-going      PT LONG TERM GOAL #2   Title  pt will report a decrease in seroma sensation in the under arm by at least 50%    Baseline  she feels fullness in the breast now     Time  4    Period  Weeks    Status  Achieved      PT LONG TERM GOAL #3   Title  Pt will be independent with self MLD for the Lt lateral breast and seroma region    Time  4    Period  Weeks    Status  On-going      PT LONG TERM GOAL #4   Title  Pt will be set up with how to obtain compression garments    Period  Weeks    Status  On-going            Plan - 10/02/18 2035    Clinical Impression Statement  Pt has persistent edema and pain in left breast and axilla with decreased range of motion and pain in left shoulder.  Her bras do not really provide much compression so upgraded foam today.  She may need to purchase a solaris swell sport or jovi pac swell spot to give her more compression .    PT Treatment/Interventions  ADLs/Self Care Home Management;Therapeutic activities;Therapeutic exercise;Patient/family education;Manual techniques;Manual lymph drainage;Scar mobilization;Passive range of motion    PT Next Visit Plan  assess effect if tape look at purchased swell spots, work  on shoulder range of motion. ,continue MLD to Lt lateral breast and seroma region with  remedial range of motion exercises to neck upper thoracic spine and trunk.  Prior to discharge  help patient get set up with compression garment and gauntlet most likely through an online vendor due to insurance how her what to order on compression guru)continue       Patient will benefit from skilled therapeutic intervention in order to improve the following deficits and impairments:     Visit Diagnosis: Abnormal posture  Localized edema  Pain in left arm  Stiffness of left shoulder, not elsewhere classified     Problem List Patient Active Problem List   Diagnosis Date Noted  . Genetic testing 04/02/2018  . Family history of breast cancer   . Malignant neoplasm of upper-outer quadrant of left female breast ([place])   . Malignant neoplasm of upper-outer quadrant of left breast in female, estrogen receptor positive ([place]) 03/16/2018  . OA (osteoarthritis) of knee 09/11/2017  . Infrapatellar bursitis of right knee 04/29/2014  . Pneumonia, aspiration ([place]) 12/28/2012  . HYPOTHYROIDISM 11/15/2010  . HYPERLIPIDEMIA 11/15/2010  . DEPRESSION 11/15/2010  . GERD 11/15/2010  . CONSTIPATION 11/15/2010  . GALLSTONES 11/15/2010  . RENAL CYST 11/15/2010  . INSOMNIA UNSPECIFIED 11/15/2010  . COUGH 11/15/2010  . ABDOMINAL PAIN OTHER SPECIFIED SITE 11/15/2010  . TRANSAMINASES, SERUM, ELEVATED 11/15/2010   Donato Heinz. Owens Shark PT  Norwood Levo 10/02/2018, 8:39 PM  [place], [place], [address] Phone: [phone]   Fax:  [phone]  Name: WENDOLYN RASO MRN: [id number] Date of Birth: 03-17-1946

## 2018-10-03 ENCOUNTER — Ambulatory Visit: Payer: Medicare Other | Admitting: Physical Therapy

## 2018-10-03 ENCOUNTER — Encounter: Payer: Self-pay | Admitting: Physical Therapy

## 2018-10-03 DIAGNOSIS — M79602 Pain in left arm: Secondary | ICD-10-CM | POA: Diagnosis not present

## 2018-10-03 DIAGNOSIS — M25612 Stiffness of left shoulder, not elsewhere classified: Secondary | ICD-10-CM | POA: Diagnosis not present

## 2018-10-03 DIAGNOSIS — R293 Abnormal posture: Secondary | ICD-10-CM

## 2018-10-03 DIAGNOSIS — R600 Localized edema: Secondary | ICD-10-CM

## 2018-10-03 NOTE — Patient Instructions (Signed)

## 2018-10-03 NOTE — Therapy (Signed)
[place], [place], [address] Phone: [phone]   Fax:  [phone]  Physical Therapy Treatment  Patient Details  Name: [name] MRN: [id number] Date of Birth: 22-Sep-1946 Referring Provider (PT): Dr. Donne Hazel   Encounter Date: 10/03/2018  PT End of Session - 10/03/18 1634    Visit Number  8    Number of Visits  17    Date for PT Re-Evaluation  10/18/18    Authorization Type  medicare/tricare    PT Start Time  1430    PT Stop Time  1515    PT Time Calculation (min)  45 min    Activity Tolerance  Patient tolerated treatment well    Behavior During Therapy  [facility] for tasks assessed/performed       Past Medical History:  Diagnosis Date  . Arthritis    hands and knees  . Complication of anesthesia    aspiration pna; following a colonoscopy, pt requests head of bed elevated if possible.  . Constipation   . Cough   . Depression   . Dysrhythmia    RBBB on 06-06-17 ekg   . Elevated liver function tests   . Esophageal stricture   . Family history of breast cancer   . Gallstones   . Genetic testing 04/02/2018   STAT Breast panel with reflex to Multi-Cancer panel (83 genes) @ [facility] - No pathogenic mutations detected  . GERD (gastroesophageal reflux disease)   . Hiatal hernia   . History of kidney stones   . History of radiation therapy 07/09/2018- 08/03/18   Left Breast/ 40.05 Gy in 15 fractions. Left Breast boost 10 Gy in 5 fractions.   . Hyperlipemia   . Hypothyroidism   . Insomnia   . Malignant neoplasm of upper-outer quadrant of left female breast ([place])   . Nephrolithiasis   . Pneumonia 2015   aspirated after colonosopy  . Renal cyst     Past Surgical History:  Procedure Laterality Date  . BREAST LUMPECTOMY WITH RADIOACTIVE SEED AND SENTINEL LYMPH NODE BIOPSY Left 04/17/2018   Procedure: LEFT BREAST LUMPECTOMY WITH BRACKETED RADIOACTIVE SEEDS AND SENTINEL LYMPH NODE BIOPSY;   Surgeon: Rolm Bookbinder, MD;  Location: [place] [facility];  Service: General;  Laterality: Left;  . BREAST SURGERY Left    Lumpectomy  . CARDIOVASCULAR STRESS TEST  07/19/2006   EF 70%, NO EVIDENCE OF ISCHEMIA  . CHOLECYSTECTOMY  12/30/10  . COLONOSCOPY    . DILATION AND CURETTAGE OF UTERUS    . HYSTEROSCOPY W/D&C N/A 06/07/2018   Procedure: DILATATION AND CURETTAGE /HYSTEROSCOPY;  Surgeon: [name], MD;  Location: [facility];  Service: Gynecology;  Laterality: N/A;  . inguinal herniography    . NASAL SINUS SURGERY    . RADIOACTIVE SEED GUIDED EXCISIONAL BREAST BIOPSY Right 04/17/2018   Procedure: RIGHT BREAST SEED GUIDED EXCISIONAL BIOPSY;  Surgeon: Rolm Bookbinder, MD;  Location: [facility];  Service: General;  Laterality: Right;  . RE-EXCISION OF BREAST LUMPECTOMY Left 05/15/2018   Procedure: RE-EXCISION OF LEFT BREAST LUMPECTOMY, ASPIRATION OF LEFT AXILLARY SEROMA;  Surgeon: Rolm Bookbinder, MD;  Location: [place];  Service: General;  Laterality: Left;  . T & A    . TOOTH EXTRACTION    . TOTAL KNEE ARTHROPLASTY Left 09/11/2017   Procedure: LEFT TOTAL KNEE ARTHROPLASTY;  Surgeon: Gaynelle Arabian, MD;  [facility];  Service: Orthopedics;  Laterality: Left;  with block  . US ECHOCARDIOGRAPHY  07/17/2006   EF 55-60%    There were no vitals filed for this visit.  Subjective Assessment - 10/03/18 1446    Subjective  Pt has been wearing the bra and the chip packs.  She has pain in her axilla and breast when she reaches her arm over her head at about a 4/10    Pertinent History  Patient was diagnosed on 03/08/18 with left grade 1-2 invasive ductal carcinoma breast cancer.It measured 1.5 cm and is located in the upper outer quadrant. It is ER/PR positive and HER2 negative with a Ki67 of < 1%.  She had a left total knee replacement in 10/18 with Dr. Wynelle Link and continues to have pain in her left lateral knee. Her orthopedist reported it is likely  due to her iliotibial band. Patient reports she underwent a left lumpectomy and SNLB (0/4 nodes positive) removed on 04/17/18.. Radiation is now complete     Patient Stated Goals  Reduce breast swelling     Currently in Pain?  Yes    Pain Score  4     Pain Location  Axilla    Pain Orientation  Left                       [facility] Adult PT Treatment/Exercise - 10/03/18 0001      Shoulder Exercises: Supine   Other Supine Exercises  dowel rod flexion x 5 reps       Manual Therapy   Manual therapy comments  pt not able to get new compresssion bras via Medicare til Nov. 25 but will consider getting a body shaper tank to hold swell spot in place     Edema Management  showed pt how to get the kimbe solaris swell spot from compression guru or [employer] and encouraged pt to do so    Manual Lymphatic Drainage (MLD)  Short neck, superficial and deep abdominal, lef inguinal nodes. and left axillo inguinal anasatamosis, anteriorn ineraxillary anastamosis, let breast and axillla    Passive ROM  to left shoulder.  pt with pain and  tightness                  PT Long Term Goals - 09/13/18 1358      PT LONG TERM GOAL #1   Title  Patient will demonstrate she has returned to baseline related to left shoulder ROM and function including WNL values of flexion and abduction without pulling    Baseline  06/20/18- 143 flexion, 112 abduction    Time  4    Period  Weeks    Status  On-going      PT LONG TERM GOAL #2   Title  pt will report a decrease in seroma sensation in the under arm by at least 50%    Baseline  she feels fullness in the breast now     Time  4    Period  Weeks    Status  Achieved      PT LONG TERM GOAL #3   Title  Pt will be independent with self MLD for the Lt lateral breast and seroma region    Time  4    Period  Weeks    Status  On-going      PT LONG TERM GOAL #4   Title  Pt will be set up with how to obtain compression garments    Period  Weeks     Status  On-going  Plan - 10/03/18 1635    Clinical Impression Statement  Pt left breast and axilla seem to be softer today after use of small dotted foam. She needs to have better compression bras, but will not get Medicare covereage until Nov 25.  She will buy a swell spot and compression cami to try.  Emphasized shoulder ROM today as pt needs to work on improving posture also.      PT Frequency  2x / week    PT Duration  4 weeks    PT Treatment/Interventions  ADLs/Self Care Home Management;Therapeutic activities;Therapeutic exercise;Patient/family education;Manual techniques;Manual lymph drainage;Scar mobilization;Passive range of motion    PT Next Visit Plan  work on shoulder range of motion. ,continue MLD to Lt lateral breast and seroma region with remedial range of motion exercises to neck upper thoracic spine and trunk.  Prior to discharge  help patient get set up with compression garment and gauntlet most likely through an online vendor due to insurance how her what to order on compression guru)continue       Patient will benefit from skilled therapeutic intervention in order to improve the following deficits and impairments:  Decreased knowledge of precautions, Impaired UE functional use, Decreased range of motion, Postural dysfunction, Decreased scar mobility, Decreased mobility, Pain, Increased fascial restricitons, Increased edema  Visit Diagnosis: Abnormal posture  Localized edema  Pain in left arm  Stiffness of left shoulder, not elsewhere classified     Problem List Patient Active Problem List   Diagnosis Date Noted  . Genetic testing 04/02/2018  . Family history of breast cancer   . Malignant neoplasm of upper-outer quadrant of left female breast ([place])   . Malignant neoplasm of upper-outer quadrant of left breast in female, estrogen receptor positive ([place]) 03/16/2018  . OA (osteoarthritis) of knee 09/11/2017  . Infrapatellar bursitis of right knee  04/29/2014  . Pneumonia, aspiration ([place]) 12/28/2012  . HYPOTHYROIDISM 11/15/2010  . HYPERLIPIDEMIA 11/15/2010  . DEPRESSION 11/15/2010  . GERD 11/15/2010  . CONSTIPATION 11/15/2010  . GALLSTONES 11/15/2010  . RENAL CYST 11/15/2010  . INSOMNIA UNSPECIFIED 11/15/2010  . COUGH 11/15/2010  . ABDOMINAL PAIN OTHER SPECIFIED SITE 11/15/2010  . TRANSAMINASES, SERUM, ELEVATED 11/15/2010   Donato Heinz. Owens Shark PT  Norwood Levo 10/03/2018, 4:37 PM  [place], [place], [address] Phone: [phone]   Fax:  [phone]  Name: ELLIS KOFFLER MRN: [id number] Date of Birth: Jan 09, 1946

## 2018-10-10 ENCOUNTER — Ambulatory Visit: Payer: Medicare Other | Admitting: Physical Therapy

## 2018-10-10 ENCOUNTER — Encounter: Payer: Self-pay | Admitting: Physical Therapy

## 2018-10-10 DIAGNOSIS — M25612 Stiffness of left shoulder, not elsewhere classified: Secondary | ICD-10-CM | POA: Diagnosis not present

## 2018-10-10 DIAGNOSIS — M79602 Pain in left arm: Secondary | ICD-10-CM | POA: Diagnosis not present

## 2018-10-10 DIAGNOSIS — R293 Abnormal posture: Secondary | ICD-10-CM

## 2018-10-10 DIAGNOSIS — R600 Localized edema: Secondary | ICD-10-CM

## 2018-10-10 NOTE — Therapy (Signed)
[facility] [address] [place], , [address] Phone: [phone]   Fax:  [phone]  Physical Therapy Treatment  Patient Details  Name: [name] MRN: [id number] Date of Birth: 11/22/1946 Referring Provider (PT): Dr. Wakefield   Encounter Date: 10/10/2018    Past Medical History:  Diagnosis Date  . Arthritis    hands and knees  . Complication of anesthesia    aspiration pna; following a colonoscopy, pt requests head of bed elevated if possible.  . Constipation   . Cough   . Depression   . Dysrhythmia    RBBB on 06-06-17 ekg   . Elevated liver function tests   . Esophageal stricture   . Family history of breast cancer   . Gallstones   . Genetic testing 04/02/2018   STAT Breast panel with reflex to Multi-Cancer panel (83 genes) @ [facility] - No pathogenic mutations detected  . GERD (gastroesophageal reflux disease)   . Hiatal hernia   . History of kidney stones   . History of radiation therapy 07/09/2018- 08/03/18   Left Breast/ 40.05 Gy in 15 fractions. Left Breast boost 10 Gy in 5 fractions.   . Hyperlipemia   . Hypothyroidism   . Insomnia   . Malignant neoplasm of upper-outer quadrant of left female breast (HCC)   . Nephrolithiasis   . Pneumonia 2015   aspirated after colonosopy  . Renal cyst     Past Surgical History:  Procedure Laterality Date  . BREAST LUMPECTOMY WITH RADIOACTIVE SEED AND SENTINEL LYMPH NODE BIOPSY Left 04/17/2018   Procedure: LEFT BREAST LUMPECTOMY WITH BRACKETED RADIOACTIVE SEEDS AND SENTINEL LYMPH NODE BIOPSY;  Surgeon: Wakefield, Matthew, MD;  Location: [facility];  Service: General;  Laterality: Left;  . BREAST SURGERY Left    Lumpectomy  . CARDIOVASCULAR STRESS TEST  07/19/2006   EF 70%, NO EVIDENCE OF ISCHEMIA  . CHOLECYSTECTOMY  12/30/10  . COLONOSCOPY    . DILATION AND CURETTAGE OF UTERUS    . HYSTEROSCOPY W/D&C N/A 06/07/2018   Procedure: DILATATION AND  CURETTAGE /HYSTEROSCOPY;  Surgeon: Grewal, Michelle, MD;  Location: [facility];  Service: Gynecology;  Laterality: N/A;  . inguinal herniography    . NASAL SINUS SURGERY    . RADIOACTIVE SEED GUIDED EXCISIONAL BREAST BIOPSY Right 04/17/2018   Procedure: RIGHT BREAST SEED GUIDED EXCISIONAL BIOPSY;  Surgeon: Wakefield, Matthew, MD;  Location: [facility];  Service: General;  Laterality: Right;  . RE-EXCISION OF BREAST LUMPECTOMY Left 05/15/2018   Procedure: RE-EXCISION OF LEFT BREAST LUMPECTOMY, ASPIRATION OF LEFT AXILLARY SEROMA;  Surgeon: Wakefield, Matthew, MD;  Location: [facility];  Service: General;  Laterality: Left;  . T & A    . TOOTH EXTRACTION    . TOTAL KNEE ARTHROPLASTY Left 09/11/2017   Procedure: LEFT TOTAL KNEE ARTHROPLASTY;  Surgeon: Aluisio, Frank, MD;  Location: [facility];  Service: Orthopedics;  Laterality: Left;  with block  . US ECHOCARDIOGRAPHY  07/17/2006   EF 55-60%    There were no vitals filed for this visit.  Subjective Assessment - 10/10/18 1403    Subjective  Pt states she is still having the pain in her her breast and along the her side just as it was, she does not thing think the chip pack is helping much     Pertinent History  Patient was diagnosed on 03/08/18 with left grade 1-2 invasive ductal carcinoma breast cancer.It measured 1.5 cm and is located in the upper   outer quadrant. It is ER/PR positive and HER2 negative with a Ki67 of < 1%.  She had a left total knee replacement in 10/18 with Dr. Aluisio and continues to have pain in her left lateral knee. Her orthopedist reported it is likely due to her iliotibial band. Patient reports she underwent a left lumpectomy and SNLB (0/4 nodes positive) removed on 04/17/18.. Radiation is now complete     Patient Stated Goals  Reduce breast swelling     Currently in Pain?  Yes    Pain Score  2     Pain Location  Axilla    Pain Orientation  Left    Pain Descriptors / Indicators  Aching;Sore    Pain Type   Chronic pain    Pain Radiating Towards  from incision to armpit     Pain Onset  More than a month ago    Pain Frequency  Intermittent                       [facility] Adult PT Treatment/Exercise - 10/10/18 0001      Exercises   Exercises  Shoulder      Shoulder Exercises: Seated   Retraction  AROM;Right;Left;5 reps      Shoulder Exercises: Sidelying   External Rotation  AROM;Strengthening;5 reps    External Rotation Limitations  only able to do 5 reps with 2# weight.  Pt able to do 10 reps with not weight .  Encouraged pt to do frequently at home with good postural alignment       Manual Therapy   Manual Lymphatic Drainage (MLD)  Short neck, superficial and deep abdominal, lef inguinal nodes. and left axillo inguinal anasatamosis, anteriorn ineraxillary anastamosis, let breast and axillla    Passive ROM  to left shoulder.  pt with pain and  tightness                  PT Long Term Goals - 09/13/18 1358      PT LONG TERM GOAL #1   Title  Patient will demonstrate she has returned to baseline related to left shoulder ROM and function including WNL values of flexion and abduction without pulling    Baseline  06/20/18- 143 flexion, 112 abduction    Time  4    Period  Weeks    Status  On-going      PT LONG TERM GOAL #2   Title  pt will report a decrease in seroma sensation in the under arm by at least 50%    Baseline  she feels fullness in the breast now     Time  4    Period  Weeks    Status  Achieved      PT LONG TERM GOAL #3   Title  Pt will be independent with self MLD for the Lt lateral breast and seroma region    Time  4    Period  Weeks    Status  On-going      PT LONG TERM GOAL #4   Title  Pt will be set up with how to obtain compression garments    Period  Weeks    Status  On-going            Plan - 10/10/18 1502    Clinical Impression Statement  Pt reports she is still having pain and fullness in her left breast, but it seems to be  softening with MLD.  Encouraged her   to do postural exercise and continue with shoulder stretches     PT Next Visit Plan  work on shoulder range of motion. ,continue MLD to Lt lateral breast and seroma region with remedial range of motion exercises to neck upper thoracic spine and trunk.  Prior to discharge  help patient get set up with compression garment and gauntlet most likely through an online vendor due to insurance how her what to order on compression guru)continue       Patient will benefit from skilled therapeutic intervention in order to improve the following deficits and impairments:  Decreased knowledge of precautions, Impaired UE functional use, Decreased range of motion, Postural dysfunction, Decreased scar mobility, Decreased mobility, Pain, Increased fascial restricitons, Increased edema  Visit Diagnosis: Abnormal posture  Localized edema  Stiffness of left shoulder, not elsewhere classified  Pain in left arm     Problem List Patient Active Problem List   Diagnosis Date Noted  . Genetic testing 04/02/2018  . Family history of breast cancer   . Malignant neoplasm of upper-outer quadrant of left female breast (HCC)   . Malignant neoplasm of upper-outer quadrant of left breast in female, estrogen receptor positive (HCC) 03/16/2018  . OA (osteoarthritis) of knee 09/11/2017  . Infrapatellar bursitis of right knee 04/29/2014  . Pneumonia, aspiration (HCC) 12/28/2012  . HYPOTHYROIDISM 11/15/2010  . HYPERLIPIDEMIA 11/15/2010  . DEPRESSION 11/15/2010  . GERD 11/15/2010  . CONSTIPATION 11/15/2010  . GALLSTONES 11/15/2010  . RENAL CYST 11/15/2010  . INSOMNIA UNSPECIFIED 11/15/2010  . COUGH 11/15/2010  . ABDOMINAL PAIN OTHER SPECIFIED SITE 11/15/2010  . TRANSAMINASES, SERUM, ELEVATED 11/15/2010    K. , PT  ,  Krall 10/10/2018, 3:07 PM  [facility] [address] [place], [place],  [address] Phone: [phone]   Fax:  [phone]  Name: [name] MRN: [id number] Date of Birth: 07/17/1946   

## 2018-10-11 ENCOUNTER — Ambulatory Visit: Payer: Medicare Other | Admitting: Physical Therapy

## 2018-10-11 ENCOUNTER — Encounter: Payer: Self-pay | Admitting: Physical Therapy

## 2018-10-11 DIAGNOSIS — M25612 Stiffness of left shoulder, not elsewhere classified: Secondary | ICD-10-CM | POA: Diagnosis not present

## 2018-10-11 DIAGNOSIS — R600 Localized edema: Secondary | ICD-10-CM | POA: Diagnosis not present

## 2018-10-11 DIAGNOSIS — R293 Abnormal posture: Secondary | ICD-10-CM | POA: Diagnosis not present

## 2018-10-11 DIAGNOSIS — M79602 Pain in left arm: Secondary | ICD-10-CM

## 2018-10-11 NOTE — Therapy (Signed)
[place], [place], [address] Phone: [phone]   Fax:  [phone]  Physical Therapy Treatment  Patient Details  Name: [name] MRN: [id number] Date of Birth: April 01, 1946 Referring Provider (PT): Dr. Donne Hazel   Encounter Date: 10/11/2018  PT End of Session - 10/11/18 2043    Visit Number  9    Number of Visits  17    Date for PT Re-Evaluation  10/18/18    Authorization Type  medicare/tricare    Activity Tolerance  Patient tolerated treatment well    Behavior During Therapy  [facility] for tasks assessed/performed       Past Medical History:  Diagnosis Date  . Arthritis    hands and knees  . Complication of anesthesia    aspiration pna; following a colonoscopy, pt requests head of bed elevated if possible.  . Constipation   . Cough   . Depression   . Dysrhythmia    RBBB on 06-06-17 ekg   . Elevated liver function tests   . Esophageal stricture   . Family history of breast cancer   . Gallstones   . Genetic testing 04/02/2018   STAT Breast panel with reflex to Multi-Cancer panel (83 genes) @ [facility] - No pathogenic mutations detected  . GERD (gastroesophageal reflux disease)   . Hiatal hernia   . History of kidney stones   . History of radiation therapy 07/09/2018- 08/03/18   Left Breast/ 40.05 Gy in 15 fractions. Left Breast boost 10 Gy in 5 fractions.   . Hyperlipemia   . Hypothyroidism   . Insomnia   . Malignant neoplasm of upper-outer quadrant of left female breast ([facility])   . Nephrolithiasis   . Pneumonia 2015   aspirated after colonosopy  . Renal cyst     Past Surgical History:  Procedure Laterality Date  . BREAST LUMPECTOMY WITH RADIOACTIVE SEED AND SENTINEL LYMPH NODE BIOPSY Left 04/17/2018   Procedure: LEFT BREAST LUMPECTOMY WITH BRACKETED RADIOACTIVE SEEDS AND SENTINEL LYMPH NODE BIOPSY;  Surgeon: Rolm Bookbinder, MD;  Location: [facility];  Service: General;   Laterality: Left;  . BREAST SURGERY Left    Lumpectomy  . CARDIOVASCULAR STRESS TEST  07/19/2006   EF 70%, NO EVIDENCE OF ISCHEMIA  . CHOLECYSTECTOMY  12/30/10  . COLONOSCOPY    . DILATION AND CURETTAGE OF UTERUS    . HYSTEROSCOPY W/D&C N/A 06/07/2018   Procedure: DILATATION AND CURETTAGE /HYSTEROSCOPY;  Surgeon: [name], MD;  Location: [facility];  Service: Gynecology;  Laterality: N/A;  . inguinal herniography    . NASAL SINUS SURGERY    . RADIOACTIVE SEED GUIDED EXCISIONAL BREAST BIOPSY Right 04/17/2018   Procedure: RIGHT BREAST SEED GUIDED EXCISIONAL BIOPSY;  Surgeon: Rolm Bookbinder, MD;  Location: [facility];  Service: General;  Laterality: Right;  . RE-EXCISION OF BREAST LUMPECTOMY Left 05/15/2018   Procedure: RE-EXCISION OF LEFT BREAST LUMPECTOMY, ASPIRATION OF LEFT AXILLARY SEROMA;  Surgeon: Rolm Bookbinder, MD;  Location: [facility];  Service: General;  Laterality: Left;  . T & A    . TOOTH EXTRACTION    . TOTAL KNEE ARTHROPLASTY Left 09/11/2017   Procedure: LEFT TOTAL KNEE ARTHROPLASTY;  Surgeon: Gaynelle Arabian, MD;  Location: [facility];  Service: Orthopedics;  Laterality: Left;  with block  . US ECHOCARDIOGRAPHY  07/17/2006   EF 55-60%    There were no vitals filed for this visit.  Subjective Assessment - 10/11/18 1346    Subjective  Pt is looking forward to her knees surgery next week.  She will continue to her arm exercises and return to PT as soon as she can .    Pertinent History  Patient was diagnosed on 03/08/18 with left grade 1-2 invasive ductal carcinoma breast cancer.It measured 1.5 cm and is located in the upper outer quadrant. It is ER/PR positive and HER2 negative with a Ki67 of < 1%.  She had a left total knee replacement in 10/18 with Dr. Wynelle Link and continues to have pain in her left lateral knee. Her orthopedist reported it is likely due to her iliotibial band. Patient reports she underwent a left lumpectomy and SNLB (0/4 nodes  positive) removed on 04/17/18.. Radiation is now complete                        [facility] Adult PT Treatment/Exercise - 10/11/18 0001      Exercises   Exercises  Shoulder      Shoulder Exercises: Sidelying   External Rotation  Strengthening;Left;5 reps    ABduction  AAROM;Left;5 reps      Shoulder Exercises: Standing   External Rotation  AROM;Right;Left;5 reps    Retraction  AROM;Right;Left;5 reps    Other Standing Exercises  dowel rod flexion, abduction and extension x 5 reps      Manual Therapy   Manual Lymphatic Drainage (MLD)  short neck, superficial and deep abdominals. right axially nodes, anterior interaxillary anastamosis, left axilla and axillo inguinal anastamsis, left breast then to sidelying for more work on lateral trunkk    Passive ROM  to left shoulder as tolerated              PT Education - 10/11/18 2043    Education provided  Yes    Education Details  [employer] home exercise program as in patient instructions    Person(s) Educated  Patient    Methods  Explanation;Demonstration;Handout    Comprehension  Verbalized understanding;Returned demonstration          PT Long Term Goals - 09/13/18 1358      PT LONG TERM GOAL #1   Title  Patient will demonstrate she has returned to baseline related to left shoulder ROM and function including WNL values of flexion and abduction without pulling    Baseline  06/20/18- 143 flexion, 112 abduction    Time  4    Period  Weeks    Status  On-going      PT LONG TERM GOAL #2   Title  pt will report a decrease in seroma sensation in the under arm by at least 50%    Baseline  she feels fullness in the breast now     Time  4    Period  Weeks    Status  Achieved      PT LONG TERM GOAL #3   Title  Pt will be independent with self MLD for the Lt lateral breast and seroma region    Time  4    Period  Weeks    Status  On-going      PT LONG TERM GOAL #4   Title  Pt will be set up with how to obtain  compression garments    Period  Weeks    Status  On-going            Plan - 10/11/18 2044    Clinical Impression Statement  Treatment today focused on range of motion exercises to  continue at home while recovering from surgery on her knee.  Pt also had more fullness in her left axilla today  MLD was focused in this area    PT Treatment/Interventions  ADLs/Self Care Home Management;Therapeutic activities;Therapeutic exercise;Patient/family education;Manual techniques;Manual lymph drainage;Scar mobilization;Passive range of motion    PT Next Visit Plan   Reasses as needed work on shoulder range of motion. ,continue MLD to Lt lateral breast and seroma region with remedial range of motion exercises to neck upper thoracic spine and trunk.  Prior to discharge  help patient get set up with compression garment and gauntlet most likely through an online vendor due to insurance how her what to order on compression guru)continue       Patient will benefit from skilled therapeutic intervention in order to improve the following deficits and impairments:  Decreased knowledge of precautions, Impaired UE functional use, Decreased range of motion, Postural dysfunction, Decreased scar mobility, Decreased mobility, Pain, Increased fascial restricitons, Increased edema  Visit Diagnosis: Abnormal posture  Localized edema  Stiffness of left shoulder, not elsewhere classified  Pain in left arm     Problem List Patient Active Problem List   Diagnosis Date Noted  . Genetic testing 04/02/2018  . Family history of breast cancer   . Malignant neoplasm of upper-outer quadrant of left female breast ([facility])   . Malignant neoplasm of upper-outer quadrant of left breast in female, estrogen receptor positive ([facility]) 03/16/2018  . OA (osteoarthritis) of knee 09/11/2017  . Infrapatellar bursitis of right knee 04/29/2014  . Pneumonia, aspiration ([facility]) 12/28/2012  . HYPOTHYROIDISM 11/15/2010  . HYPERLIPIDEMIA  11/15/2010  . DEPRESSION 11/15/2010  . GERD 11/15/2010  . CONSTIPATION 11/15/2010  . GALLSTONES 11/15/2010  . RENAL CYST 11/15/2010  . INSOMNIA UNSPECIFIED 11/15/2010  . COUGH 11/15/2010  . ABDOMINAL PAIN OTHER SPECIFIED SITE 11/15/2010  . TRANSAMINASES, SERUM, ELEVATED 11/15/2010   Donato Heinz. Owens Shark PT  Norwood Levo 10/11/2018, 8:47 PM  [place], [place], [address] Phone: [phone]   Fax:  [phone]  Name: [name] MRN: [id number] Date of Birth: 26-Jan-1946

## 2018-10-11 NOTE — Patient Instructions (Signed)
Access Code: [email or web address]  URL: [email or web address]  Date: 10/11/2018  Prepared by: Maudry Diego   Exercises  Standing Shoulder Flexion AAROM with Dowel - 10 reps - 3 sets - 1x daily - 7x weekly  Standing Shoulder Abduction AAROM with Dowel - 10 reps - 3 sets - 1x daily - 7x weekly  Standing Shoulder Extension with Dowel - 10 reps - 3 sets - 1x daily - 7x weekly  Standing Scapular Retraction in Abduction - 10 reps - 3 sets - 1x daily - 7x weekly  Sidelying Shoulder External Rotation - 10 reps - 3 sets - 1x daily - 7x weekly

## 2018-10-16 DIAGNOSIS — Z96652 Presence of left artificial knee joint: Secondary | ICD-10-CM | POA: Diagnosis not present

## 2018-10-16 DIAGNOSIS — M25862 Other specified joint disorders, left knee: Secondary | ICD-10-CM | POA: Diagnosis not present

## 2018-10-16 DIAGNOSIS — M6588 Other synovitis and tenosynovitis, other site: Secondary | ICD-10-CM | POA: Diagnosis not present

## 2018-10-16 DIAGNOSIS — M67862 Other specified disorders of synovium, left knee: Secondary | ICD-10-CM | POA: Diagnosis not present

## 2018-10-22 DIAGNOSIS — R531 Weakness: Secondary | ICD-10-CM | POA: Diagnosis not present

## 2018-10-22 DIAGNOSIS — R262 Difficulty in walking, not elsewhere classified: Secondary | ICD-10-CM | POA: Diagnosis not present

## 2018-10-22 DIAGNOSIS — M25661 Stiffness of right knee, not elsewhere classified: Secondary | ICD-10-CM | POA: Diagnosis not present

## 2018-10-22 DIAGNOSIS — G5782 Other specified mononeuropathies of left lower limb: Secondary | ICD-10-CM | POA: Diagnosis not present

## 2018-10-24 ENCOUNTER — Ambulatory Visit: Payer: Medicare Other | Attending: General Surgery | Admitting: Physical Therapy

## 2018-10-24 ENCOUNTER — Encounter: Payer: Self-pay | Admitting: Physical Therapy

## 2018-10-24 DIAGNOSIS — M25612 Stiffness of left shoulder, not elsewhere classified: Secondary | ICD-10-CM | POA: Diagnosis not present

## 2018-10-24 DIAGNOSIS — M6281 Muscle weakness (generalized): Secondary | ICD-10-CM | POA: Diagnosis not present

## 2018-10-24 DIAGNOSIS — M79602 Pain in left arm: Secondary | ICD-10-CM | POA: Diagnosis not present

## 2018-10-24 DIAGNOSIS — R293 Abnormal posture: Secondary | ICD-10-CM | POA: Insufficient documentation

## 2018-10-24 DIAGNOSIS — R600 Localized edema: Secondary | ICD-10-CM | POA: Diagnosis not present

## 2018-10-24 NOTE — Therapy (Signed)
[facility] [place], [place], [address] Phone: [phone]   Fax:  [phone]  Physical Therapy Treatment   Progress Note Reporting Period 08/20/2018   to 10/24/2018  See note below for Objective Data and Assessment of Progress/Goals.        Patient Details  Name: [name] MRN: [id number] Date of Birth: [birth year] Referring Provider (PT): Dr. Donne Hazel    Encounter Date: 10/24/2018  PT End of Session - 10/24/18 1400    Visit Number  10    Number of Visits  25    Date for PT Re-Evaluation  11/23/18    Authorization Type  medicare/tricare    PT Start Time  1300    PT Stop Time  1345    PT Time Calculation (min)  45 min    Activity Tolerance  Patient tolerated treatment well    Behavior During Therapy  [place] for tasks assessed/performed       Past Medical History:  Diagnosis Date  . Arthritis    hands and knees  . Complication of anesthesia    aspiration pna; following a colonoscopy, pt requests head of bed elevated if possible.  . Constipation   . Cough   . Depression   . Dysrhythmia    RBBB on 06-06-17 ekg   . Elevated liver function tests   . Esophageal stricture   . Family history of breast cancer   . Gallstones   . Genetic testing 04/02/2018   STAT Breast panel with reflex to Multi-Cancer panel (83 genes) @ [facility] - No pathogenic mutations detected  . GERD (gastroesophageal reflux disease)   . Hiatal hernia   . History of kidney stones   . History of radiation therapy 07/09/2018- 08/03/18   Left Breast/ 40.05 Gy in 15 fractions. Left Breast boost 10 Gy in 5 fractions.   . Hyperlipemia   . Hypothyroidism   . Insomnia   . Malignant neoplasm of upper-outer quadrant of left female breast ([place])   . Nephrolithiasis   . Pneumonia 2015   aspirated after colonosopy  . Renal cyst     Past Surgical History:  Procedure Laterality Date  . BREAST LUMPECTOMY WITH RADIOACTIVE SEED AND  SENTINEL LYMPH NODE BIOPSY Left 04/17/2018   Procedure: LEFT BREAST LUMPECTOMY WITH BRACKETED RADIOACTIVE SEEDS AND SENTINEL LYMPH NODE BIOPSY;  Surgeon: Rolm Bookbinder, MD;  Location: [facility];  Service: General;  Laterality: Left;  . BREAST SURGERY Left    Lumpectomy  . CARDIOVASCULAR STRESS TEST  07/19/2006   EF 70%, NO EVIDENCE OF ISCHEMIA  . CHOLECYSTECTOMY  12/30/10  . COLONOSCOPY    . DILATION AND CURETTAGE OF UTERUS    . HYSTEROSCOPY W/D&C N/A 06/07/2018   Procedure: DILATATION AND CURETTAGE /HYSTEROSCOPY;  Surgeon: [name], MD;  Location: [facility];  Service: Gynecology;  Laterality: N/A;  . inguinal herniography    . NASAL SINUS SURGERY    . RADIOACTIVE SEED GUIDED EXCISIONAL BREAST BIOPSY Right 04/17/2018   Procedure: RIGHT BREAST SEED GUIDED EXCISIONAL BIOPSY;  Surgeon: Rolm Bookbinder, MD;  Location: [facility];  Service: General;  Laterality: Right;  . RE-EXCISION OF BREAST LUMPECTOMY Left 05/15/2018   Procedure: RE-EXCISION OF LEFT BREAST LUMPECTOMY, ASPIRATION OF LEFT AXILLARY SEROMA;  Surgeon: Rolm Bookbinder, MD;  Location: [facility];  Service: General;  Laterality: Left;  . T & A    . TOOTH EXTRACTION    . TOTAL KNEE ARTHROPLASTY Left 09/11/2017  Procedure: LEFT TOTAL KNEE ARTHROPLASTY;  Surgeon: Gaynelle Arabian, MD;  Location: [facility];  Service: Orthopedics;  Laterality: Left;  with block  . US ECHOCARDIOGRAPHY  07/17/2006   EF 55-60%    There were no vitals filed for this visit.  Subjective Assessment - 10/24/18 1301    Subjective  Pt states that she  had her knee surgery and it all went well.  She did not use a walk or crutch after surgery and has been doing her shoulder exercises.  She will be going to [facility] for her knee rehab and will be coming here for her Upper quadrant PT and lymphedema treatment  She still has the fullness in her breast and lateral chest.  It doesn't seem to change much  She did get the  swell spot and tried it but didn't seem to feel much different     Pertinent History  Patient was diagnosed on 03/08/18 with left grade 1-2 invasive ductal carcinoma breast cancer.It measured 1.5 cm and is located in the upper outer quadrant. It is ER/PR positive and HER2 negative with a Ki67 of < 1%.  She had a left total knee replacement in 10/18 with Dr. Wynelle Link and continues to have pain in her left lateral knee. Her orthopedist reported it is likely due to her iliotibial band. Patient reports she underwent a left lumpectomy and SNLB (0/4 nodes positive) removed on 04/17/18.. Radiation is now complete     Patient Stated Goals  Reduce breast swelling     Currently in Pain?  Yes    Pain Score  3     Pain Location  Breast    Pain Orientation  Left    Pain Descriptors / Indicators  Aching    Pain Type  Chronic pain    Pain Radiating Towards  from the incision to the armpit     Pain Onset  More than a month ago    Pain Frequency  Intermittent         [facility] PT Assessment - 10/24/18 0001      Assessment   Medical Diagnosis  s/p left lumpectomy and SLNB    Referring Provider (PT)  Dr. Donne Hazel     Onset Date/Surgical Date  04/17/18      Prior Function   Level of Independence  Independent      AROM   Left Shoulder Flexion  140 Degrees    Left Shoulder ABduction  170 Degrees      Palpation   Palpation comment  fullness and firmness in left breast at upper central and outer quadrant. fullness in left axilla around well healed scat                    [facility] Adult PT Treatment/Exercise - 10/24/18 0001      Exercises   Exercises  Shoulder      Shoulder Exercises: Supine   Other Supine Exercises  dowel rod flexion x 5 reps     Other Supine Exercises  supine scapular series with yellow theraband       Shoulder Exercises: Sidelying   Flexion  AROM;Left;5 reps    ABduction  AAROM;Left;5 reps    Other Sidelying Exercises  small circles with hand pointed       Manual Therapy    Manual Lymphatic Drainage (MLD)  short neck, superficial and deep abdominals. right axially nodes, anterior interaxillary anastamosis, left axilla and axillo inguinal anastamsis, left breast then to sidelying for more  work on lateral trunkk    Passive ROM  to left shoulder as tolerated                   PT Long Term Goals - 10/24/18 1407      PT LONG TERM GOAL #1   Title  Patient will demonstrate she has returned to baseline related to left shoulder ROM and function including WNL values of flexion and abduction without pulling    Baseline  06/20/18- 143 flexion, 112 abduction, 10/24/2018 140 degrees flexion and 170 degrees of abduction of left shoulder     Status  On-going      PT LONG TERM GOAL #2   Title  pt will report a decrease in seroma sensation in the under arm by at least 50%    Baseline  she feels fullness in the breast and axilla that is firm to touch     Time  4    Period  Weeks    Status  On-going      PT LONG TERM GOAL #3   Title  Pt will be independent with self MLD for the Lt lateral breast and seroma region    Status  Achieved      PT LONG TERM GOAL #4   Title  Pt will be set up with how to obtain compression garments    Baseline  to be measured for sleeve on 10/29/2018 and for compression bra on 11/05/2018     Period  Weeks    Status  On-going      PT LONG TERM GOAL #5   Title  Patient wil verbalize understanding of lymphedema risk reduction practices.    Status  Achieved      PT LONG TERM GOAL #6   Title  sit to stand 5 times </= 13 seconds due to improve strength and endurance    Status  Deferred            Plan - 10/24/18 1401    Clinical Impression Statement  Pt comes back to PT for her shoulder and breast lymphedema after having surgery on her knee to remove scar tissue.  She reports she has been doing exercises for her arms at home, but she still has pain in axilla and breast.  She continues to have fullness to palpation close to the  scars in her left axilla and breast She has an appointment to get a compression bra on Nov. 25 as that is when her insurance coverage will allow. Recert sent today for another 4 weeks of treatment that will focus on decreasing the fullness but also increasing shoudler and upper quadrant strength.  script sent for coverage for compression sleeve and pt will be measured by [facility] on Monday     Rehab Potential  Excellent    Clinical Impairments Affecting Rehab Potential  None    PT Frequency  2x / week    PT Duration  4 weeks    PT Next Visit Plan  work on shoulder range of motion and strength. ,continue MLD to Lt lateral breast and seroma region with remedial range of motion exercises to neck upper thoracic spine and trunk.        Patient will benefit from skilled therapeutic intervention in order to improve the following deficits and impairments:  Decreased knowledge of precautions, Impaired UE functional use, Decreased range of motion, Postural dysfunction, Decreased scar mobility, Decreased mobility, Pain, Increased fascial restricitons, Increased edema  Visit Diagnosis: Abnormal  posture - Plan: PT plan of care cert/re-cert  Localized edema - Plan: PT plan of care cert/re-cert  Stiffness of left shoulder, not elsewhere classified - Plan: PT plan of care cert/re-cert  Pain in left arm - Plan: PT plan of care cert/re-cert  Muscle weakness (generalized) - Plan: PT plan of care cert/re-cert     Problem List Patient Active Problem List   Diagnosis Date Noted  . Genetic testing 04/02/2018  . Family history of breast cancer   . Malignant neoplasm of upper-outer quadrant of left female breast ([place])   . Malignant neoplasm of upper-outer quadrant of left breast in female, estrogen receptor positive ([place]) 03/16/2018  . OA (osteoarthritis) of knee 09/11/2017  . Infrapatellar bursitis of right knee 04/29/2014  . Pneumonia, aspiration ([place]) 12/28/2012  . HYPOTHYROIDISM 11/15/2010  .  HYPERLIPIDEMIA 11/15/2010  . DEPRESSION 11/15/2010  . GERD 11/15/2010  . CONSTIPATION 11/15/2010  . GALLSTONES 11/15/2010  . RENAL CYST 11/15/2010  . INSOMNIA UNSPECIFIED 11/15/2010  . COUGH 11/15/2010  . ABDOMINAL PAIN OTHER SPECIFIED SITE 11/15/2010  . TRANSAMINASES, SERUM, ELEVATED 11/15/2010   Donato Heinz. Owens Shark PT  [facility] 10/24/2018, 2:14 PM  [facility] [place], [place], [address] Phone: [phone]   Fax:  [phone]  Name: [name] MRN: [id number] Date of Birth: 14-Nov-1946

## 2018-10-24 NOTE — Patient Instructions (Signed)

## 2018-10-25 ENCOUNTER — Ambulatory Visit: Payer: Medicare Other | Admitting: Physical Therapy

## 2018-10-25 ENCOUNTER — Encounter: Payer: Self-pay | Admitting: Physical Therapy

## 2018-10-25 DIAGNOSIS — R262 Difficulty in walking, not elsewhere classified: Secondary | ICD-10-CM | POA: Diagnosis not present

## 2018-10-25 DIAGNOSIS — M25661 Stiffness of right knee, not elsewhere classified: Secondary | ICD-10-CM | POA: Diagnosis not present

## 2018-10-25 DIAGNOSIS — M6281 Muscle weakness (generalized): Secondary | ICD-10-CM | POA: Diagnosis not present

## 2018-10-25 DIAGNOSIS — M79602 Pain in left arm: Secondary | ICD-10-CM | POA: Diagnosis not present

## 2018-10-25 DIAGNOSIS — M25612 Stiffness of left shoulder, not elsewhere classified: Secondary | ICD-10-CM | POA: Diagnosis not present

## 2018-10-25 DIAGNOSIS — R293 Abnormal posture: Secondary | ICD-10-CM | POA: Diagnosis not present

## 2018-10-25 DIAGNOSIS — R600 Localized edema: Secondary | ICD-10-CM | POA: Diagnosis not present

## 2018-10-25 DIAGNOSIS — R531 Weakness: Secondary | ICD-10-CM | POA: Diagnosis not present

## 2018-10-25 DIAGNOSIS — G5782 Other specified mononeuropathies of left lower limb: Secondary | ICD-10-CM | POA: Diagnosis not present

## 2018-10-25 NOTE — Therapy (Signed)
[place], [place], [address] Phone: [phone]   Fax:  [phone]  Physical Therapy Treatment  Patient Details  Name: [name] MRN: [id number] Date of Birth: 1946/10/25 Referring Provider (PT): Dr. Donne Hazel    Encounter Date: 10/25/2018  PT End of Session - 10/25/18 1202    Visit Number  11    Number of Visits  25    Date for PT Re-Evaluation  11/23/18    Authorization Type  medicare/tricare    PT Start Time  1104    PT Stop Time  1145    PT Time Calculation (min)  41 min    Activity Tolerance  Patient tolerated treatment well    Behavior During Therapy  [facility] for tasks assessed/performed       Past Medical History:  Diagnosis Date  . Arthritis    hands and knees  . Complication of anesthesia    aspiration pna; following a colonoscopy, pt requests head of bed elevated if possible.  . Constipation   . Cough   . Depression   . Dysrhythmia    RBBB on 06-06-17 ekg   . Elevated liver function tests   . Esophageal stricture   . Family history of breast cancer   . Gallstones   . Genetic testing 04/02/2018   STAT Breast panel with reflex to Multi-Cancer panel (83 genes) @ [facility] - No pathogenic mutations detected  . GERD (gastroesophageal reflux disease)   . Hiatal hernia   . History of kidney stones   . History of radiation therapy 07/09/2018- 08/03/18   Left Breast/ 40.05 Gy in 15 fractions. Left Breast boost 10 Gy in 5 fractions.   . Hyperlipemia   . Hypothyroidism   . Insomnia   . Malignant neoplasm of upper-outer quadrant of left female breast ([place])   . Nephrolithiasis   . Pneumonia 2015   aspirated after colonosopy  . Renal cyst     Past Surgical History:  Procedure Laterality Date  . BREAST LUMPECTOMY WITH RADIOACTIVE SEED AND SENTINEL LYMPH NODE BIOPSY Left 04/17/2018   Procedure: LEFT BREAST LUMPECTOMY WITH BRACKETED RADIOACTIVE SEEDS AND SENTINEL LYMPH NODE BIOPSY;   Surgeon: Rolm Bookbinder, MD;  Location: [facility];  Service: General;  Laterality: Left;  . BREAST SURGERY Left    Lumpectomy  . CARDIOVASCULAR STRESS TEST  07/19/2006   EF 70%, NO EVIDENCE OF ISCHEMIA  . CHOLECYSTECTOMY  12/30/10  . COLONOSCOPY    . DILATION AND CURETTAGE OF UTERUS    . HYSTEROSCOPY W/D&C N/A 06/07/2018   Procedure: DILATATION AND CURETTAGE /HYSTEROSCOPY;  Surgeon: [name], MD;  Location: [facility];  Service: Gynecology;  Laterality: N/A;  . inguinal herniography    . NASAL SINUS SURGERY    . RADIOACTIVE SEED GUIDED EXCISIONAL BREAST BIOPSY Right 04/17/2018   Procedure: RIGHT BREAST SEED GUIDED EXCISIONAL BIOPSY;  Surgeon: Rolm Bookbinder, MD;  Location: [facility];  Service: General;  Laterality: Right;  . RE-EXCISION OF BREAST LUMPECTOMY Left 05/15/2018   Procedure: RE-EXCISION OF LEFT BREAST LUMPECTOMY, ASPIRATION OF LEFT AXILLARY SEROMA;  Surgeon: Rolm Bookbinder, MD;  Location: [facility];  Service: General;  Laterality: Left;  . T & A    . TOOTH EXTRACTION    . TOTAL KNEE ARTHROPLASTY Left 09/11/2017   Procedure: LEFT TOTAL KNEE ARTHROPLASTY;  Surgeon: Gaynelle Arabian, MD;  Location: [facility];  Service: Orthopedics;  Laterality: Left;  with block  . US ECHOCARDIOGRAPHY  07/17/2006   EF 55-60%    There were no vitals filed for this visit.                    [facility] Adult PT Treatment/Exercise - 10/25/18 0001      Exercises   Exercises  Shoulder;Elbow;Lumbar      Elbow Exercises   Elbow Flexion  Strengthening;Left    Theraband Level (Elbow Flexion)  Level 2 (Red)    Elbow Extension  Strengthening;Left    Theraband Level (Elbow Extension)  Level 2 (Red)      Shoulder Exercises: Supine   Flexion  AROM;Right;Left;10 reps    Other Supine Exercises  dowel rod flexion x 5 reps       Shoulder Exercises: Sidelying   External Rotation  Strengthening;Left;10 reps    External Rotation Weight (lbs)  3     Flexion  AROM;Left;5 reps    ABduction  AAROM;Left;5 reps    Other Sidelying Exercises  small circles with hand pointed       Shoulder Exercises: Standing   Other Standing Exercises  with arm in external rotaton with red theraband in left hand , walkouts x 10 reps with one step only. cues to keep core engaged       Manual Therapy   Manual Lymphatic Drainage (MLD)  diaphragmatic breathing, bilateral inguinal nodes, briefly anterior and posterior interaxillary anastamosis and left lateral chest                   PT Long Term Goals - 10/24/18 1407      PT LONG TERM GOAL #1   Title  Patient will demonstrate she has returned to baseline related to left shoulder ROM and function including WNL values of flexion and abduction without pulling    Baseline  06/20/18- 143 flexion, 112 abduction, 10/24/2018 140 degrees flexion and 170 degrees of abduction of left shoulder     Status  On-going      PT LONG TERM GOAL #2   Title  pt will report a decrease in seroma sensation in the under arm by at least 50%    Baseline  she feels fullness in the breast and axilla that is firm to touch     Time  4    Period  Weeks    Status  On-going      PT LONG TERM GOAL #3   Title  Pt will be independent with self MLD for the Lt lateral breast and seroma region    Status  Achieved      PT LONG TERM GOAL #4   Title  Pt will be set up with how to obtain compression garments    Baseline  to be measured for sleeve on 10/29/2018 and for compression bra on 11/05/2018     Period  Weeks    Status  On-going      PT LONG TERM GOAL #5   Title  Patient wil verbalize understanding of lymphedema risk reduction practices.    Status  Achieved      PT LONG TERM GOAL #6   Title  sit to stand 5 times </= 13 seconds due to improve strength and endurance    Status  Deferred            Plan - 10/25/18 1203    Clinical Impression Statement  Pt comes in with swell spot in left chest that needed to be  repositioned and pt was instructed in how  to place it.  Upgraded exercise and focused on external rotation strengthening with cues to keep core engaged     Rehab Potential  Excellent    PT Frequency  2x / week    PT Duration  4 weeks    PT Treatment/Interventions  ADLs/Self Care Home Management;Therapeutic activities;Therapeutic exercise;Patient/family education;Manual techniques;Manual lymph drainage;Scar mobilization;Passive range of motion    PT Next Visit Plan  work on shoulder range of motion and strength. ,continue MLD to Lt lateral breast and seroma region with remedial range of motion exercises to neck upper thoracic spine and trunk.     Consulted and Agree with Plan of Care  Patient       Patient will benefit from skilled therapeutic intervention in order to improve the following deficits and impairments:  Decreased knowledge of precautions, Impaired UE functional use, Decreased range of motion, Postural dysfunction, Decreased scar mobility, Decreased mobility, Pain, Increased fascial restricitons, Increased edema  Visit Diagnosis: Abnormal posture  Localized edema  Stiffness of left shoulder, not elsewhere classified  Pain in left arm  Muscle weakness (generalized)     Problem List Patient Active Problem List   Diagnosis Date Noted  . Genetic testing 04/02/2018  . Family history of breast cancer   . Malignant neoplasm of upper-outer quadrant of left female breast ([place])   . Malignant neoplasm of upper-outer quadrant of left breast in female, estrogen receptor positive ([place]) 03/16/2018  . OA (osteoarthritis) of knee 09/11/2017  . Infrapatellar bursitis of right knee 04/29/2014  . Pneumonia, aspiration ([place]) 12/28/2012  . HYPOTHYROIDISM 11/15/2010  . HYPERLIPIDEMIA 11/15/2010  . DEPRESSION 11/15/2010  . GERD 11/15/2010  . CONSTIPATION 11/15/2010  . GALLSTONES 11/15/2010  . RENAL CYST 11/15/2010  . INSOMNIA UNSPECIFIED 11/15/2010  . COUGH 11/15/2010  . ABDOMINAL  PAIN OTHER SPECIFIED SITE 11/15/2010  . TRANSAMINASES, SERUM, ELEVATED 11/15/2010   Donato Heinz. Owens Shark PT  [facility] 10/25/2018, 12:06 PM  [place], [place], [address] Phone: [phone]   Fax:  [phone]  Name: [name] MRN: [id number] Date of Birth: 10-09-1946

## 2018-10-29 DIAGNOSIS — R262 Difficulty in walking, not elsewhere classified: Secondary | ICD-10-CM | POA: Diagnosis not present

## 2018-10-29 DIAGNOSIS — R531 Weakness: Secondary | ICD-10-CM | POA: Diagnosis not present

## 2018-10-29 DIAGNOSIS — M25661 Stiffness of right knee, not elsewhere classified: Secondary | ICD-10-CM | POA: Diagnosis not present

## 2018-10-29 DIAGNOSIS — G5782 Other specified mononeuropathies of left lower limb: Secondary | ICD-10-CM | POA: Diagnosis not present

## 2018-10-31 ENCOUNTER — Encounter: Payer: Self-pay | Admitting: Physical Therapy

## 2018-10-31 ENCOUNTER — Ambulatory Visit: Payer: Medicare Other | Admitting: Physical Therapy

## 2018-10-31 DIAGNOSIS — M25661 Stiffness of right knee, not elsewhere classified: Secondary | ICD-10-CM | POA: Diagnosis not present

## 2018-10-31 DIAGNOSIS — M6281 Muscle weakness (generalized): Secondary | ICD-10-CM | POA: Diagnosis not present

## 2018-10-31 DIAGNOSIS — R293 Abnormal posture: Secondary | ICD-10-CM | POA: Diagnosis not present

## 2018-10-31 DIAGNOSIS — M25612 Stiffness of left shoulder, not elsewhere classified: Secondary | ICD-10-CM

## 2018-10-31 DIAGNOSIS — R600 Localized edema: Secondary | ICD-10-CM

## 2018-10-31 DIAGNOSIS — R262 Difficulty in walking, not elsewhere classified: Secondary | ICD-10-CM | POA: Diagnosis not present

## 2018-10-31 DIAGNOSIS — R531 Weakness: Secondary | ICD-10-CM | POA: Diagnosis not present

## 2018-10-31 DIAGNOSIS — G5782 Other specified mononeuropathies of left lower limb: Secondary | ICD-10-CM | POA: Diagnosis not present

## 2018-10-31 DIAGNOSIS — M79602 Pain in left arm: Secondary | ICD-10-CM | POA: Diagnosis not present

## 2018-10-31 NOTE — Therapy (Signed)
[place], [place], [address] Phone: [phone]   Fax:  [phone]  Physical Therapy Treatment  Patient Details  Name: [name] MRN: [id number] Date of Birth: 09-Jul-1946 Referring Provider (PT): Dr. Donne Hazel    Encounter Date: 10/31/2018  PT End of Session - 10/31/18 1625    Visit Number  12    Number of Visits  25    Date for PT Re-Evaluation  11/23/18    Authorization Type  medicare/tricare    PT Start Time  1350    PT Stop Time  1430    PT Time Calculation (min)  40 min    Activity Tolerance  Patient tolerated treatment well    Behavior During Therapy  [facility] for tasks assessed/performed       Past Medical History:  Diagnosis Date  . Arthritis    hands and knees  . Complication of anesthesia    aspiration pna; following a colonoscopy, pt requests head of bed elevated if possible.  . Constipation   . Cough   . Depression   . Dysrhythmia    RBBB on 06-06-17 ekg   . Elevated liver function tests   . Esophageal stricture   . Family history of breast cancer   . Gallstones   . Genetic testing 04/02/2018   STAT Breast panel with reflex to Multi-Cancer panel (83 genes) @ [facility] - No pathogenic mutations detected  . GERD (gastroesophageal reflux disease)   . Hiatal hernia   . History of kidney stones   . History of radiation therapy 07/09/2018- 08/03/18   Left Breast/ 40.05 Gy in 15 fractions. Left Breast boost 10 Gy in 5 fractions.   . Hyperlipemia   . Hypothyroidism   . Insomnia   . Malignant neoplasm of upper-outer quadrant of left female breast ([place])   . Nephrolithiasis   . Pneumonia 2015   aspirated after colonosopy  . Renal cyst     Past Surgical History:  Procedure Laterality Date  . BREAST LUMPECTOMY WITH RADIOACTIVE SEED AND SENTINEL LYMPH NODE BIOPSY Left 04/17/2018   Procedure: LEFT BREAST LUMPECTOMY WITH BRACKETED RADIOACTIVE SEEDS AND SENTINEL LYMPH NODE BIOPSY;   Surgeon: Rolm Bookbinder, MD;  Location: [facility];  Service: General;  Laterality: Left;  . BREAST SURGERY Left    Lumpectomy  . CARDIOVASCULAR STRESS TEST  07/19/2006   EF 70%, NO EVIDENCE OF ISCHEMIA  . CHOLECYSTECTOMY  12/30/10  . COLONOSCOPY    . DILATION AND CURETTAGE OF UTERUS    . HYSTEROSCOPY W/D&C N/A 06/07/2018   Procedure: DILATATION AND CURETTAGE /HYSTEROSCOPY;  Surgeon: [name], MD;  Location: [facility];  Service: Gynecology;  Laterality: N/A;  . inguinal herniography    . NASAL SINUS SURGERY    . RADIOACTIVE SEED GUIDED EXCISIONAL BREAST BIOPSY Right 04/17/2018   Procedure: RIGHT BREAST SEED GUIDED EXCISIONAL BIOPSY;  Surgeon: Rolm Bookbinder, MD;  Location: [facility];  Service: General;  Laterality: Right;  . RE-EXCISION OF BREAST LUMPECTOMY Left 05/15/2018   Procedure: RE-EXCISION OF LEFT BREAST LUMPECTOMY, ASPIRATION OF LEFT AXILLARY SEROMA;  Surgeon: Rolm Bookbinder, MD;  Location: [facility];  Service: General;  Laterality: Left;  . T & A    . TOOTH EXTRACTION    . TOTAL KNEE ARTHROPLASTY Left 09/11/2017   Procedure: LEFT TOTAL KNEE ARTHROPLASTY;  Surgeon: Gaynelle Arabian, MD;  Location: [facility];  Service: Orthopedics;  Laterality: Left;  with block  . US ECHOCARDIOGRAPHY  07/17/2006   EF 55-60%    There were no vitals filed for this visit.  Subjective Assessment - 10/31/18 1357    Subjective  Pt states that her knee is swollen and red.  She says that she is doing everything she wants to with her shoulder at home.  It is not painfree, but she is doing it. She got measured for a sleeve and a gauntlet too.                       [facility] Adult PT Treatment/Exercise - 10/31/18 0001      Exercises   Exercises  Shoulder      Shoulder Exercises: Standing   Other Standing Exercises  with arm in external rotaton with red theraband in left hand , walkouts x 10 reps with one step only. cues to keep core engaged        Shoulder Exercises: Pulleys   Flexion  2 minutes    ABduction  2 minutes      Shoulder Exercises: ROM/Strengthening   Ball on Wall  10 reps with yellow ball with emphasis on trunk extension at the top with pushing into the ball     Other ROM/Strengthening Exercises  unable to do modified downward dog stretch due to knee pain       Manual Therapy   Manual Lymphatic Drainage (MLD)  short neck, superficial and deep abdominals. right axially nodes, anterior interaxillary anastamosis, left axilla and axillo inguinal anastamsis, left breast then to sidelying for more work on lateral trunkk    Passive ROM  to left shoulder as tolerated                   PT Long Term Goals - 10/24/18 1407      PT LONG TERM GOAL #1   Title  Patient will demonstrate she has returned to baseline related to left shoulder ROM and function including WNL values of flexion and abduction without pulling    Baseline  06/20/18- 143 flexion, 112 abduction, 10/24/2018 140 degrees flexion and 170 degrees of abduction of left shoulder     Status  On-going      PT LONG TERM GOAL #2   Title  pt will report a decrease in seroma sensation in the under arm by at least 50%    Baseline  she feels fullness in the breast and axilla that is firm to touch     Time  4    Period  Weeks    Status  On-going      PT LONG TERM GOAL #3   Title  Pt will be independent with self MLD for the Lt lateral breast and seroma region    Status  Achieved      PT LONG TERM GOAL #4   Title  Pt will be set up with how to obtain compression garments    Baseline  to be measured for sleeve on 10/29/2018 and for compression bra on 11/05/2018     Period  Weeks    Status  On-going      PT LONG TERM GOAL #5   Title  Patient wil verbalize understanding of lymphedema risk reduction practices.    Status  Achieved      PT LONG TERM GOAL #6   Title  sit to stand 5 times </= 13 seconds due to improve strength and endurance    Status   Deferred  Plan - 10/31/18 1625    Clinical Impression Statement  Pt seems to have less firmness in left axilla and less fullness at lateral chest, but still has pain and fullness in breast.  shoulder ROM appears to be improved also, but still has soreness with sidelying abudciton     PT Frequency  2x / week    PT Duration  4 weeks    PT Next Visit Plan  work on shoulder range of motion and strength. ,continue MLD to Lt lateral breast and seroma region with remedial range of motion exercises to neck upper thoracic spine and trunk.     PT Home Exercise Plan  post op shoulder ROM HEP, supine scapular series        Patient will benefit from skilled therapeutic intervention in order to improve the following deficits and impairments:  Decreased knowledge of precautions, Impaired UE functional use, Decreased range of motion, Postural dysfunction, Decreased scar mobility, Decreased mobility, Pain, Increased fascial restricitons, Increased edema  Visit Diagnosis: Abnormal posture  Localized edema  Stiffness of left shoulder, not elsewhere classified  Pain in left arm  Muscle weakness (generalized)     Problem List Patient Active Problem List   Diagnosis Date Noted  . Genetic testing 04/02/2018  . Family history of breast cancer   . Malignant neoplasm of upper-outer quadrant of left female breast ([place])   . Malignant neoplasm of upper-outer quadrant of left breast in female, estrogen receptor positive ([place]) 03/16/2018  . OA (osteoarthritis) of knee 09/11/2017  . Infrapatellar bursitis of right knee 04/29/2014  . Pneumonia, aspiration ([place]) 12/28/2012  . HYPOTHYROIDISM 11/15/2010  . HYPERLIPIDEMIA 11/15/2010  . DEPRESSION 11/15/2010  . GERD 11/15/2010  . CONSTIPATION 11/15/2010  . GALLSTONES 11/15/2010  . RENAL CYST 11/15/2010  . INSOMNIA UNSPECIFIED 11/15/2010  . COUGH 11/15/2010  . ABDOMINAL PAIN OTHER SPECIFIED SITE 11/15/2010  . TRANSAMINASES, SERUM, ELEVATED  11/15/2010   Donato Heinz. Owens Shark PT  Norwood Levo 10/31/2018, 4:27 PM  [place], [place], [address] Phone: [phone]   Fax:  [phone]  Name: [name] MRN: [id number] Date of Birth: [birth year]

## 2018-11-02 ENCOUNTER — Ambulatory Visit: Payer: Medicare Other | Admitting: Physical Therapy

## 2018-11-02 ENCOUNTER — Encounter: Payer: Self-pay | Admitting: Physical Therapy

## 2018-11-02 DIAGNOSIS — R600 Localized edema: Secondary | ICD-10-CM

## 2018-11-02 DIAGNOSIS — R293 Abnormal posture: Secondary | ICD-10-CM | POA: Diagnosis not present

## 2018-11-02 DIAGNOSIS — M79602 Pain in left arm: Secondary | ICD-10-CM | POA: Diagnosis not present

## 2018-11-02 DIAGNOSIS — M25612 Stiffness of left shoulder, not elsewhere classified: Secondary | ICD-10-CM | POA: Diagnosis not present

## 2018-11-02 DIAGNOSIS — M6281 Muscle weakness (generalized): Secondary | ICD-10-CM

## 2018-11-02 NOTE — Therapy (Signed)
[place], [place], [address] Phone: [phone]   Fax:  [phone]  Physical Therapy Treatment  Patient Details  Name: [name] MRN: [id number] Date of Birth: May 17, 1946 Referring Provider (PT): Dr. Donne Hazel    Encounter Date: 11/02/2018  PT End of Session - 11/02/18 1217    Visit Number  13    Number of Visits  25    Date for PT Re-Evaluation  11/23/18    PT Start Time  0930    PT Stop Time  1015    PT Time Calculation (min)  45 min    Activity Tolerance  Patient tolerated treatment well    Behavior During Therapy  [facility] for tasks assessed/performed       Past Medical History:  Diagnosis Date  . Arthritis    hands and knees  . Complication of anesthesia    aspiration pna; following a colonoscopy, pt requests head of bed elevated if possible.  . Constipation   . Cough   . Depression   . Dysrhythmia    RBBB on 06-06-17 ekg   . Elevated liver function tests   . Esophageal stricture   . Family history of breast cancer   . Gallstones   . Genetic testing 04/02/2018   STAT Breast panel with reflex to Multi-Cancer panel (83 genes) @ [facility] - No pathogenic mutations detected  . GERD (gastroesophageal reflux disease)   . Hiatal hernia   . History of kidney stones   . History of radiation therapy 07/09/2018- 08/03/18   Left Breast/ 40.05 Gy in 15 fractions. Left Breast boost 10 Gy in 5 fractions.   . Hyperlipemia   . Hypothyroidism   . Insomnia   . Malignant neoplasm of upper-outer quadrant of left female breast ([place])   . Nephrolithiasis   . Pneumonia 2015   aspirated after colonosopy  . Renal cyst     Past Surgical History:  Procedure Laterality Date  . BREAST LUMPECTOMY WITH RADIOACTIVE SEED AND SENTINEL LYMPH NODE BIOPSY Left 04/17/2018   Procedure: LEFT BREAST LUMPECTOMY WITH BRACKETED RADIOACTIVE SEEDS AND SENTINEL LYMPH NODE BIOPSY;  Surgeon: Rolm Bookbinder, MD;  Location:  [facility];  Service: General;  Laterality: Left;  . BREAST SURGERY Left    Lumpectomy  . CARDIOVASCULAR STRESS TEST  07/19/2006   EF 70%, NO EVIDENCE OF ISCHEMIA  . CHOLECYSTECTOMY  12/30/10  . COLONOSCOPY    . DILATION AND CURETTAGE OF UTERUS    . HYSTEROSCOPY W/D&C N/A 06/07/2018   Procedure: DILATATION AND CURETTAGE /HYSTEROSCOPY;  Surgeon: [name], MD;  Location: [facility];  Service: Gynecology;  Laterality: N/A;  . inguinal herniography    . NASAL SINUS SURGERY    . RADIOACTIVE SEED GUIDED EXCISIONAL BREAST BIOPSY Right 04/17/2018   Procedure: RIGHT BREAST SEED GUIDED EXCISIONAL BIOPSY;  Surgeon: Rolm Bookbinder, MD;  Location: [facility];  Service: General;  Laterality: Right;  . RE-EXCISION OF BREAST LUMPECTOMY Left 05/15/2018   Procedure: RE-EXCISION OF LEFT BREAST LUMPECTOMY, ASPIRATION OF LEFT AXILLARY SEROMA;  Surgeon: Rolm Bookbinder, MD;  Location: [facility];  Service: General;  Laterality: Left;  . T & A    . TOOTH EXTRACTION    . TOTAL KNEE ARTHROPLASTY Left 09/11/2017   Procedure: LEFT TOTAL KNEE ARTHROPLASTY;  Surgeon: Gaynelle Arabian, MD;  [facility];  Service: Orthopedics;  Laterality: Left;  with block  . US ECHOCARDIOGRAPHY  07/17/2006   EF 55-60%  There were no vitals filed for this visit.  Subjective Assessment - 11/02/18 0946    Subjective  Pt is having some pain in her left breast and axilla     Pertinent History  Patient was diagnosed on 03/08/18 with left grade 1-2 invasive ductal carcinoma breast cancer.It measured 1.5 cm and is located in the upper outer quadrant. It is ER/PR positive and HER2 negative with a Ki67 of < 1%.  She had a left total knee replacement in 10/18 with Dr. Wynelle Link and continues to have pain in her left lateral knee. Her orthopedist reported it is likely due to her iliotibial band. Patient reports she underwent a left lumpectomy and SNLB (0/4 nodes positive) removed on 04/17/18..  Radiation is now complete     Patient Stated Goals  Reduce breast swelling     Currently in Pain?  Yes    Pain Score  2     Pain Location  Breast    Pain Orientation  Left                       [facility] Adult PT Treatment/Exercise - 11/02/18 0001      Self-Care   Self-Care  Other Self-Care Comments    Other Self-Care Comments   pt will be getting new compression bra on Monday.  Recommended she consider [facility] Hugger, [facility] and [facility]       Exercises   Exercises  Shoulder      Manual Therapy   Myofascial Release  with prolonged pressure to pec major and minor and posterior tilt stretches to left scapula     Manual Lymphatic Drainage (MLD)  short neck, superficial and deep abdominals. right axially nodes, anterior interaxillary anastamosis, left axilla and axillo inguinal anastamsis, left breast then to sidelying for more work on lateral trunkk    Passive ROM  to left shoulder as tolerated                   PT Long Term Goals - 10/24/18 1407      PT LONG TERM GOAL #1   Title  Patient will demonstrate she has returned to baseline related to left shoulder ROM and function including WNL values of flexion and abduction without pulling    Baseline  06/20/18- 143 flexion, 112 abduction, 10/24/2018 140 degrees flexion and 170 degrees of abduction of left shoulder     Status  On-going      PT LONG TERM GOAL #2   Title  pt will report a decrease in seroma sensation in the under arm by at least 50%    Baseline  she feels fullness in the breast and axilla that is firm to touch     Time  4    Period  Weeks    Status  On-going      PT LONG TERM GOAL #3   Title  Pt will be independent with self MLD for the Lt lateral breast and seroma region    Status  Achieved      PT LONG TERM GOAL #4   Title  Pt will be set up with how to obtain compression garments    Baseline  to be measured for sleeve on 10/29/2018 and for compression bra on 11/05/2018     Period   Weeks    Status  On-going      PT LONG TERM GOAL #5   Title  Patient wil verbalize understanding of lymphedema  risk reduction practices.    Status  Achieved      PT LONG TERM GOAL #6   Title  sit to stand 5 times </= 13 seconds due to improve strength and endurance    Status  Deferred            Plan - 11/02/18 1218    Clinical Impression Statement  Pt continues to have improvment with softening in her left axilla and breast today.  She will be getting her compression bra on Monday     PT Duration  4 weeks    PT Home Exercise Plan  post op shoulder ROM HEP, supine scapular series     Consulted and Agree with Plan of Care  Patient       Patient will benefit from skilled therapeutic intervention in order to improve the following deficits and impairments:  Decreased knowledge of precautions, Impaired UE functional use, Decreased range of motion, Postural dysfunction, Decreased scar mobility, Decreased mobility, Pain, Increased fascial restricitons, Increased edema  Visit Diagnosis: Abnormal posture  Localized edema  Stiffness of left shoulder, not elsewhere classified  Pain in left arm  Muscle weakness (generalized)     Problem List Patient Active Problem List   Diagnosis Date Noted  . Genetic testing 04/02/2018  . Family history of breast cancer   . Malignant neoplasm of upper-outer quadrant of left female breast ([place])   . Malignant neoplasm of upper-outer quadrant of left breast in female, estrogen receptor positive ([place]) 03/16/2018  . OA (osteoarthritis) of knee 09/11/2017  . Infrapatellar bursitis of right knee 04/29/2014  . Pneumonia, aspiration ([facility]) 12/28/2012  . HYPOTHYROIDISM 11/15/2010  . HYPERLIPIDEMIA 11/15/2010  . DEPRESSION 11/15/2010  . GERD 11/15/2010  . CONSTIPATION 11/15/2010  . GALLSTONES 11/15/2010  . RENAL CYST 11/15/2010  . INSOMNIA UNSPECIFIED 11/15/2010  . COUGH 11/15/2010  . ABDOMINAL PAIN OTHER SPECIFIED SITE 11/15/2010  .  TRANSAMINASES, SERUM, ELEVATED 11/15/2010   Donato Heinz. Owens Shark PT  Norwood Levo 11/02/2018, 12:22 PM  [place], [place], [address] Phone: [phone]   Fax:  [phone]  Name: [name] MRN: [id number] Date of Birth: 12-Nov-1946

## 2018-11-06 ENCOUNTER — Encounter: Payer: Self-pay | Admitting: Physical Therapy

## 2018-11-06 ENCOUNTER — Other Ambulatory Visit: Payer: Self-pay

## 2018-11-06 ENCOUNTER — Ambulatory Visit: Payer: Medicare Other | Admitting: Physical Therapy

## 2018-11-06 DIAGNOSIS — R293 Abnormal posture: Secondary | ICD-10-CM | POA: Diagnosis not present

## 2018-11-06 DIAGNOSIS — R600 Localized edema: Secondary | ICD-10-CM | POA: Diagnosis not present

## 2018-11-06 DIAGNOSIS — M6281 Muscle weakness (generalized): Secondary | ICD-10-CM

## 2018-11-06 DIAGNOSIS — M25612 Stiffness of left shoulder, not elsewhere classified: Secondary | ICD-10-CM

## 2018-11-06 DIAGNOSIS — M79602 Pain in left arm: Secondary | ICD-10-CM

## 2018-11-06 NOTE — Therapy (Signed)
[place], [place], [address] Phone: [phone]   Fax:  [phone]  Physical Therapy Treatment  Patient Details  Name: [name] MRN: [id number] Date of Birth: 21-Apr-1946 Referring Provider (PT): Dr. Donne Hazel    Encounter Date: 11/06/2018  PT End of Session - 11/06/18 1530    Visit Number  14    Number of Visits  25    Date for PT Re-Evaluation  11/23/18    Authorization Type  medicare/tricare    PT Start Time  1440    PT Stop Time  1525    PT Time Calculation (min)  45 min    Activity Tolerance  Patient tolerated treatment well    Behavior During Therapy  [facility] for tasks assessed/performed       Past Medical History:  Diagnosis Date  . Arthritis    hands and knees  . Complication of anesthesia    aspiration pna; following a colonoscopy, pt requests head of bed elevated if possible.  . Constipation   . Cough   . Depression   . Dysrhythmia    RBBB on 06-06-17 ekg   . Elevated liver function tests   . Esophageal stricture   . Family history of breast cancer   . Gallstones   . Genetic testing 04/02/2018   STAT Breast panel with reflex to Multi-Cancer panel (83 genes) @ [facility] - No pathogenic mutations detected  . GERD (gastroesophageal reflux disease)   . Hiatal hernia   . History of kidney stones   . History of radiation therapy 07/09/2018- 08/03/18   Left Breast/ 40.05 Gy in 15 fractions. Left Breast boost 10 Gy in 5 fractions.   . Hyperlipemia   . Hypothyroidism   . Insomnia   . Malignant neoplasm of upper-outer quadrant of left female breast ([place])   . Nephrolithiasis   . Pneumonia 2015   aspirated after colonosopy  . Renal cyst     Past Surgical History:  Procedure Laterality Date  . BREAST LUMPECTOMY WITH RADIOACTIVE SEED AND SENTINEL LYMPH NODE BIOPSY Left 04/17/2018   Procedure: LEFT BREAST LUMPECTOMY WITH BRACKETED RADIOACTIVE SEEDS AND SENTINEL LYMPH NODE BIOPSY;   Surgeon: Rolm Bookbinder, MD;  Location: [facility];  Service: General;  Laterality: Left;  . BREAST SURGERY Left    Lumpectomy  . CARDIOVASCULAR STRESS TEST  07/19/2006   EF 70%, NO EVIDENCE OF ISCHEMIA  . CHOLECYSTECTOMY  12/30/10  . COLONOSCOPY    . DILATION AND CURETTAGE OF UTERUS    . HYSTEROSCOPY W/D&C N/A 06/07/2018   Procedure: DILATATION AND CURETTAGE /HYSTEROSCOPY;  Surgeon: [name], MD;  Location: [facility];  Service: Gynecology;  Laterality: N/A;  . inguinal herniography    . NASAL SINUS SURGERY    . RADIOACTIVE SEED GUIDED EXCISIONAL BREAST BIOPSY Right 04/17/2018   Procedure: RIGHT BREAST SEED GUIDED EXCISIONAL BIOPSY;  Surgeon: Rolm Bookbinder, MD;  Location: [facility];  Service: General;  Laterality: Right;  . RE-EXCISION OF BREAST LUMPECTOMY Left 05/15/2018   Procedure: RE-EXCISION OF LEFT BREAST LUMPECTOMY, ASPIRATION OF LEFT AXILLARY SEROMA;  Surgeon: Rolm Bookbinder, MD;  Location: [facility];  Service: General;  Laterality: Left;  . T & A    . TOOTH EXTRACTION    . TOTAL KNEE ARTHROPLASTY Left 09/11/2017   Procedure: LEFT TOTAL KNEE ARTHROPLASTY;  Surgeon: Gaynelle Arabian, MD;  [facility];  Service: Orthopedics;  Laterality: Left;  with block  . US ECHOCARDIOGRAPHY  07/17/2006   EF 55-60%    There were no vitals filed for this visit.  Subjective Assessment - 11/06/18 1448    Subjective  Pt got her compression bras and comes in wearing the [place] and it feels fine     Pertinent History  Patient was diagnosed on 03/08/18 with left grade 1-2 invasive ductal carcinoma breast cancer.It measured 1.5 cm and is located in the upper outer quadrant. It is ER/PR positive and HER2 negative with a Ki67 of < 1%.  She had a left total knee replacement in 10/18 with Dr. Wynelle Link and continues to have pain in her left lateral knee. Her orthopedist reported it is likely due to her iliotibial band. Patient reports she underwent a left  lumpectomy and SNLB (0/4 nodes positive) removed on 04/17/18.. Radiation is now complete     Patient Stated Goals  Reduce breast swelling     Currently in Pain?  No/denies                       [facility] Adult PT Treatment/Exercise - 11/06/18 0001      Exercises   Exercises  Shoulder      Shoulder Exercises: Stretch   Other Shoulder Stretches  doorway stretch with elbow bent to 90 and fully elevated over heard, teapot strech, in supine, from arms crossed  elevate to overhead at a "Y" and return to "W"       Manual Therapy   Edema Management  encouraged pt to wear swell spot inside compression bra at firm spot at superior portion of breast     Manual Lymphatic Drainage (MLD)  short neck, superficial and deep abdominals. right axially nodes, anterior interaxillary anastamosis, left axilla and axillo inguinal anastamsis, left breast then to sidelying for more work on lateral trunkk                  PT Long Term Goals - 11/06/18 1449      PT LONG TERM GOAL #1   Title  Patient will demonstrate she has returned to baseline related to left shoulder ROM and function including WNL values of flexion and abduction without pulling    Baseline  06/20/18- 143 flexion, 112 abduction, 10/24/2018 140 degrees flexion and 170 degrees of abduction of left shoulder     Period  Weeks    Status  On-going      PT LONG TERM GOAL #2   Title  pt will report a decrease in seroma sensation in the under arm by at least 50%    Baseline  she feels fullness in the breast and axilla that is firm to touch     Time  4    Period  Weeks    Status  On-going      PT LONG TERM GOAL #3   Title  Pt will be independent with self MLD for the Lt lateral breast and seroma region    Status  Achieved      PT LONG TERM GOAL #4   Title  Pt will be set up with how to obtain compression garments    Baseline  to be measured for sleeve on 10/29/2018 and for compression bra on 11/05/2018     Status  Achieved       PT LONG TERM GOAL #5   Title  Patient wil verbalize understanding of lymphedema risk reduction practices.    Status  Achieved  Plan - 11/06/18 1531    Clinical Impression Statement  Pt appears to be much better overall and reports she is doing her exercise at home.  She still has a firm area at superior breast that is tender to palpation She says it feels "like a boil".  It is not warm or red.  She will try to see if it gets better with compression for about a week, and if not will make an appointment to see Dr. Donne Hazel.     Rehab Potential  Excellent    PT Frequency  2x / week    PT Duration  4 weeks    PT Treatment/Interventions  ADLs/Self Care Home Management;Therapeutic activities;Therapeutic exercise;Patient/family education;Manual techniques;Manual lymph drainage;Scar mobilization;Passive range of motion    PT Next Visit Plan  continue MLD to Lt lateral breast and seroma region with remedial range of motion exercises to neck upper thoracic spine and trunk. follow up with exercises as needed.     PT Home Exercise Plan  post op shoulder ROM HEP, supine scapular series     Consulted and Agree with Plan of Care  Patient       Patient will benefit from skilled therapeutic intervention in order to improve the following deficits and impairments:  Decreased knowledge of precautions, Impaired UE functional use, Decreased range of motion, Postural dysfunction, Decreased scar mobility, Decreased mobility, Pain, Increased fascial restricitons, Increased edema  Visit Diagnosis: Abnormal posture  Localized edema  Stiffness of left shoulder, not elsewhere classified  Pain in left arm  Muscle weakness (generalized)     Problem List Patient Active Problem List   Diagnosis Date Noted  . Genetic testing 04/02/2018  . Family history of breast cancer   . Malignant neoplasm of upper-outer quadrant of left female breast ([place])   . Malignant neoplasm of upper-outer  quadrant of left breast in female, estrogen receptor positive ([place]) 03/16/2018  . OA (osteoarthritis) of knee 09/11/2017  . Infrapatellar bursitis of right knee 04/29/2014  . Pneumonia, aspiration ([place]) 12/28/2012  . HYPOTHYROIDISM 11/15/2010  . HYPERLIPIDEMIA 11/15/2010  . DEPRESSION 11/15/2010  . GERD 11/15/2010  . CONSTIPATION 11/15/2010  . GALLSTONES 11/15/2010  . RENAL CYST 11/15/2010  . INSOMNIA UNSPECIFIED 11/15/2010  . COUGH 11/15/2010  . ABDOMINAL PAIN OTHER SPECIFIED SITE 11/15/2010  . TRANSAMINASES, SERUM, ELEVATED 11/15/2010   Donato Heinz. Owens Shark PT  Norwood Levo 11/06/2018, 3:35 PM  [place], [place], [address] Phone: [phone]   Fax:  [phone]  Name: MAUDEAN HOFFMANN MRN: [id number] Date of Birth: 1946-04-26

## 2018-11-07 DIAGNOSIS — R262 Difficulty in walking, not elsewhere classified: Secondary | ICD-10-CM | POA: Diagnosis not present

## 2018-11-07 DIAGNOSIS — M25661 Stiffness of right knee, not elsewhere classified: Secondary | ICD-10-CM | POA: Diagnosis not present

## 2018-11-07 DIAGNOSIS — G5782 Other specified mononeuropathies of left lower limb: Secondary | ICD-10-CM | POA: Diagnosis not present

## 2018-11-07 DIAGNOSIS — R531 Weakness: Secondary | ICD-10-CM | POA: Diagnosis not present

## 2018-11-12 DIAGNOSIS — M25661 Stiffness of right knee, not elsewhere classified: Secondary | ICD-10-CM | POA: Diagnosis not present

## 2018-11-12 DIAGNOSIS — G5782 Other specified mononeuropathies of left lower limb: Secondary | ICD-10-CM | POA: Diagnosis not present

## 2018-11-12 DIAGNOSIS — R262 Difficulty in walking, not elsewhere classified: Secondary | ICD-10-CM | POA: Diagnosis not present

## 2018-11-12 DIAGNOSIS — R531 Weakness: Secondary | ICD-10-CM | POA: Diagnosis not present

## 2018-11-14 ENCOUNTER — Ambulatory Visit: Payer: Medicare Other | Attending: General Surgery | Admitting: Physical Therapy

## 2018-11-14 ENCOUNTER — Encounter: Payer: Self-pay | Admitting: Physical Therapy

## 2018-11-14 DIAGNOSIS — R600 Localized edema: Secondary | ICD-10-CM | POA: Insufficient documentation

## 2018-11-14 DIAGNOSIS — M25612 Stiffness of left shoulder, not elsewhere classified: Secondary | ICD-10-CM | POA: Diagnosis not present

## 2018-11-14 DIAGNOSIS — R293 Abnormal posture: Secondary | ICD-10-CM | POA: Diagnosis not present

## 2018-11-14 DIAGNOSIS — M6281 Muscle weakness (generalized): Secondary | ICD-10-CM | POA: Diagnosis not present

## 2018-11-14 DIAGNOSIS — M79602 Pain in left arm: Secondary | ICD-10-CM | POA: Insufficient documentation

## 2018-11-14 NOTE — Therapy (Signed)
[place], [place], [address] Phone: [phone]   Fax:  [phone]  Physical Therapy Treatment  Patient Details  Name: [name] MRN: [id number] Date of Birth: 25-Mar-1946 Referring Provider (PT): Dr. Donne Hazel    Encounter Date: 11/14/2018  PT End of Session - 11/14/18 1640    Visit Number  15    Number of Visits  25    Date for PT Re-Evaluation  11/23/18    Authorization Type  medicare/tricare    PT Start Time  1530    PT Stop Time  1615    PT Time Calculation (min)  45 min    Activity Tolerance  Patient tolerated treatment well       Past Medical History:  Diagnosis Date  . Arthritis    hands and knees  . Complication of anesthesia    aspiration pna; following a colonoscopy, pt requests head of bed elevated if possible.  . Constipation   . Cough   . Depression   . Dysrhythmia    RBBB on 06-06-17 ekg   . Elevated liver function tests   . Esophageal stricture   . Family history of breast cancer   . Gallstones   . Genetic testing 04/02/2018   STAT Breast panel with reflex to Multi-Cancer panel (83 genes) @ [facility] - No pathogenic mutations detected  . GERD (gastroesophageal reflux disease)   . Hiatal hernia   . History of kidney stones   . History of radiation therapy 07/09/2018- 08/03/18   Left Breast/ 40.05 Gy in 15 fractions. Left Breast boost 10 Gy in 5 fractions.   . Hyperlipemia   . Hypothyroidism   . Insomnia   . Malignant neoplasm of upper-outer quadrant of left female breast ([place])   . Nephrolithiasis   . Pneumonia 2015   aspirated after colonosopy  . Renal cyst     Past Surgical History:  Procedure Laterality Date  . BREAST LUMPECTOMY WITH RADIOACTIVE SEED AND SENTINEL LYMPH NODE BIOPSY Left 04/17/2018   Procedure: LEFT BREAST LUMPECTOMY WITH BRACKETED RADIOACTIVE SEEDS AND SENTINEL LYMPH NODE BIOPSY;  Surgeon: Rolm Bookbinder, MD;  Location: [facility];  Service: General;  Laterality: Left;  . BREAST SURGERY Left    Lumpectomy  . CARDIOVASCULAR STRESS TEST  07/19/2006   EF 70%, NO EVIDENCE OF ISCHEMIA  . CHOLECYSTECTOMY  12/30/10  . COLONOSCOPY    . DILATION AND CURETTAGE OF UTERUS    . HYSTEROSCOPY W/D&C N/A 06/07/2018   Procedure: DILATATION AND CURETTAGE /HYSTEROSCOPY;  Surgeon: [name], MD;  Location: [facility];  Service: Gynecology;  Laterality: N/A;  . inguinal herniography    . NASAL SINUS SURGERY    . RADIOACTIVE SEED GUIDED EXCISIONAL BREAST BIOPSY Right 04/17/2018   Procedure: RIGHT BREAST SEED GUIDED EXCISIONAL BIOPSY;  Surgeon: Rolm Bookbinder, MD;  Location: [facility];  Service: General;  Laterality: Right;  . RE-EXCISION OF BREAST LUMPECTOMY Left 05/15/2018   Procedure: RE-EXCISION OF LEFT BREAST LUMPECTOMY, ASPIRATION OF LEFT AXILLARY SEROMA;  Surgeon: Rolm Bookbinder, MD;  Location: [facility];  Service: General;  Laterality: Left;  . T & A    . TOOTH EXTRACTION    . TOTAL KNEE ARTHROPLASTY Left 09/11/2017   Procedure: LEFT TOTAL KNEE ARTHROPLASTY;  Surgeon: Gaynelle Arabian, MD;  Location: [facility];  Service: Orthopedics;  Laterality: Left;  with block  . US ECHOCARDIOGRAPHY  07/17/2006   EF 55-60%    There were  no vitals filed for this visit.  Subjective Assessment - 11/14/18 1608    Subjective  Pt was working on a TRX where she was leaning back with her arms and she is feeling lots of pain in her axilla.  She is upset about the side effects of tamoxifen and will talk to Dr. Jana Hakim about it next week     Pertinent History  Patient was diagnosed on 03/08/18 with left grade 1-2 invasive ductal carcinoma breast cancer.It measured 1.5 cm and is located in the upper outer quadrant. It is ER/PR positive and HER2 negative with a Ki67 of < 1%.  She had a left total knee replacement in 10/18 with Dr. Wynelle Link and continues to have pain in her left lateral knee. Her orthopedist reported it is  likely due to her iliotibial band. Patient reports she underwent a left lumpectomy and SNLB (0/4 nodes positive) removed on 04/17/18.. Radiation is now complete     Patient Stated Goals  Reduce breast swelling     Currently in Pain?  Yes    Pain Score  --   did not rate    Pain Location  Axilla    Pain Orientation  Left    Pain Descriptors / Indicators  Sore    Pain Type  Acute pain    Pain Radiating Towards  to breast     Pain Onset  In the past 7 days    Pain Frequency  Intermittent                       [facility] Adult PT Treatment/Exercise - 11/14/18 0001      Exercises   Exercises  Shoulder      Shoulder Exercises: Standing   Other Standing Exercises  with free motion machine, bilateal rows with elbows extended and external rotation walkouts.  Showed pt these so she could do them at her gym  Pt did them with no plates       Manual Therapy   Manual Lymphatic Drainage (MLD)  short neck, superficial and deep abdominals. right axially nodes, anterior interaxillary anastamosis, left axilla and axillo inguinal anastamsis, left breast then to sidelying for more work on lateral trunkk                  PT Long Term Goals - 11/06/18 1449      PT LONG TERM GOAL #1   Title  Patient will demonstrate she has returned to baseline related to left shoulder ROM and function including WNL values of flexion and abduction without pulling    Baseline  06/20/18- 143 flexion, 112 abduction, 10/24/2018 140 degrees flexion and 170 degrees of abduction of left shoulder     Period  Weeks    Status  On-going      PT LONG TERM GOAL #2   Title  pt will report a decrease in seroma sensation in the under arm by at least 50%    Baseline  she feels fullness in the breast and axilla that is firm to touch     Time  4    Period  Weeks    Status  On-going      PT LONG TERM GOAL #3   Title  Pt will be independent with self MLD for the Lt lateral breast and seroma region    Status   Achieved      PT LONG TERM GOAL #4   Title  Pt will be set up  with how to obtain compression garments    Baseline  to be measured for sleeve on 10/29/2018 and for compression bra on 11/05/2018     Status  Achieved      PT LONG TERM GOAL #5   Title  Patient wil verbalize understanding of lymphedema risk reduction practices.    Status  Achieved            Plan - 11/14/18 1640    Clinical Impression Statement  Pt had an exacerbation of left axilla and breast pain and swelling after doing TRX body weight exercise where she was leaning back and supporting herself with her hands and arms. She received some relief from MLD today.  Pt was shown some exercise she can do with a cable machine at her gym.     Rehab Potential  Excellent    Clinical Impairments Affecting Rehab Potential  None    PT Frequency  2x / week    PT Treatment/Interventions  ADLs/Self Care Home Management;Therapeutic activities;Therapeutic exercise;Patient/family education;Manual techniques;Manual lymph drainage;Scar mobilization;Passive range of motion    PT Next Visit Plan  continue MLD to Lt lateral breast and seroma region with remedial range of motion exercises to neck upper thoracic spine and trunk. follow up with exercises as needed.     Consulted and Agree with Plan of Care  Patient       Patient will benefit from skilled therapeutic intervention in order to improve the following deficits and impairments:  Decreased knowledge of precautions, Impaired UE functional use, Decreased range of motion, Postural dysfunction, Decreased scar mobility, Decreased mobility, Pain, Increased fascial restricitons, Increased edema  Visit Diagnosis: Abnormal posture  Localized edema  Stiffness of left shoulder, not elsewhere classified  Pain in left arm  Muscle weakness (generalized)     Problem List Patient Active Problem List   Diagnosis Date Noted  . Genetic testing 04/02/2018  . Family history of breast cancer    . Malignant neoplasm of upper-outer quadrant of left female breast ([place])   . Malignant neoplasm of upper-outer quadrant of left breast in female, estrogen receptor positive ([place]) 03/16/2018  . OA (osteoarthritis) of knee 09/11/2017  . Infrapatellar bursitis of right knee 04/29/2014  . Pneumonia, aspiration ([place]) 12/28/2012  . HYPOTHYROIDISM 11/15/2010  . HYPERLIPIDEMIA 11/15/2010  . DEPRESSION 11/15/2010  . GERD 11/15/2010  . CONSTIPATION 11/15/2010  . GALLSTONES 11/15/2010  . RENAL CYST 11/15/2010  . INSOMNIA UNSPECIFIED 11/15/2010  . COUGH 11/15/2010  . ABDOMINAL PAIN OTHER SPECIFIED SITE 11/15/2010  . TRANSAMINASES, SERUM, ELEVATED 11/15/2010   Donato Heinz. Owens Shark PT  Norwood Levo 11/14/2018, 4:42 PM  [place], [place], [address] Phone: [phone]   Fax:  [phone]  Name: [name] MRN: [id number] Date of Birth: 1946-11-03

## 2018-11-15 DIAGNOSIS — M25661 Stiffness of right knee, not elsewhere classified: Secondary | ICD-10-CM | POA: Diagnosis not present

## 2018-11-15 DIAGNOSIS — G5782 Other specified mononeuropathies of left lower limb: Secondary | ICD-10-CM | POA: Diagnosis not present

## 2018-11-15 DIAGNOSIS — R531 Weakness: Secondary | ICD-10-CM | POA: Diagnosis not present

## 2018-11-15 DIAGNOSIS — R262 Difficulty in walking, not elsewhere classified: Secondary | ICD-10-CM | POA: Diagnosis not present

## 2018-11-16 ENCOUNTER — Ambulatory Visit: Payer: Medicare Other | Admitting: Physical Therapy

## 2018-11-16 ENCOUNTER — Encounter: Payer: Self-pay | Admitting: Physical Therapy

## 2018-11-16 DIAGNOSIS — M79602 Pain in left arm: Secondary | ICD-10-CM | POA: Diagnosis not present

## 2018-11-16 DIAGNOSIS — R600 Localized edema: Secondary | ICD-10-CM | POA: Diagnosis not present

## 2018-11-16 DIAGNOSIS — R293 Abnormal posture: Secondary | ICD-10-CM | POA: Diagnosis not present

## 2018-11-16 DIAGNOSIS — M25612 Stiffness of left shoulder, not elsewhere classified: Secondary | ICD-10-CM | POA: Diagnosis not present

## 2018-11-16 DIAGNOSIS — M6281 Muscle weakness (generalized): Secondary | ICD-10-CM | POA: Diagnosis not present

## 2018-11-16 NOTE — Therapy (Signed)
[place], [place], [address] Phone: [phone]   Fax:  [phone]  Physical Therapy Treatment  Patient Details  Name: [name] MRN: [id number] Date of Birth: July 09, 1946 Referring Provider (PT): Dr. Donne Hazel    Encounter Date: 11/16/2018  PT End of Session - 11/16/18 1213    Visit Number  16    Number of Visits  25    Date for PT Re-Evaluation  11/23/18    Authorization Type  medicare/tricare    PT Start Time  1100    PT Stop Time  1145    PT Time Calculation (min)  45 min    Behavior During Therapy  [facility] for tasks assessed/performed       Past Medical History:  Diagnosis Date  . Arthritis    hands and knees  . Complication of anesthesia    aspiration pna; following a colonoscopy, pt requests head of bed elevated if possible.  . Constipation   . Cough   . Depression   . Dysrhythmia    RBBB on 06-06-17 ekg   . Elevated liver function tests   . Esophageal stricture   . Family history of breast cancer   . Gallstones   . Genetic testing 04/02/2018   STAT Breast panel with reflex to Multi-Cancer panel (83 genes) @ [facility] - No pathogenic mutations detected  . GERD (gastroesophageal reflux disease)   . Hiatal hernia   . History of kidney stones   . History of radiation therapy 07/09/2018- 08/03/18   Left Breast/ 40.05 Gy in 15 fractions. Left Breast boost 10 Gy in 5 fractions.   . Hyperlipemia   . Hypothyroidism   . Insomnia   . Malignant neoplasm of upper-outer quadrant of left female breast ([place])   . Nephrolithiasis   . Pneumonia 2015   aspirated after colonosopy  . Renal cyst     Past Surgical History:  Procedure Laterality Date  . BREAST LUMPECTOMY WITH RADIOACTIVE SEED AND SENTINEL LYMPH NODE BIOPSY Left 04/17/2018   Procedure: LEFT BREAST LUMPECTOMY WITH BRACKETED RADIOACTIVE SEEDS AND SENTINEL LYMPH NODE BIOPSY;  Surgeon: Rolm Bookbinder, MD;  Location: [facility];  Service: General;  Laterality: Left;  . BREAST SURGERY Left    Lumpectomy  . CARDIOVASCULAR STRESS TEST  07/19/2006   EF 70%, NO EVIDENCE OF ISCHEMIA  . CHOLECYSTECTOMY  12/30/10  . COLONOSCOPY    . DILATION AND CURETTAGE OF UTERUS    . HYSTEROSCOPY W/D&C N/A 06/07/2018   Procedure: DILATATION AND CURETTAGE /HYSTEROSCOPY;  Surgeon: [name], MD;  Location: [facility];  Service: Gynecology;  Laterality: N/A;  . inguinal herniography    . NASAL SINUS SURGERY    . RADIOACTIVE SEED GUIDED EXCISIONAL BREAST BIOPSY Right 04/17/2018   Procedure: RIGHT BREAST SEED GUIDED EXCISIONAL BIOPSY;  Surgeon: Rolm Bookbinder, MD;  Location: [facility];  Service: General;  Laterality: Right;  . RE-EXCISION OF BREAST LUMPECTOMY Left 05/15/2018   Procedure: RE-EXCISION OF LEFT BREAST LUMPECTOMY, ASPIRATION OF LEFT AXILLARY SEROMA;  Surgeon: Rolm Bookbinder, MD;  Location: [facility];  Service: General;  Laterality: Left;  . T & A    . TOOTH EXTRACTION    . TOTAL KNEE ARTHROPLASTY Left 09/11/2017   Procedure: LEFT TOTAL KNEE ARTHROPLASTY;  Surgeon: Gaynelle Arabian, MD;  Location: [facility];  Service: Orthopedics;  Laterality: Left;  with block  . US ECHOCARDIOGRAPHY  07/17/2006   EF 55-60%    There  were no vitals filed for this visit.  Subjective Assessment - 11/16/18 1211    Subjective  Pt says she is feeling better today, though still has soreness in left breast     Pertinent History  Patient was diagnosed on 03/08/18 with left grade 1-2 invasive ductal carcinoma breast cancer.It measured 1.5 cm and is located in the upper outer quadrant. It is ER/PR positive and HER2 negative with a Ki67 of < 1%.  She had a left total knee replacement in 10/18 with Dr. Wynelle Link and continues to have pain in her left lateral knee. Her orthopedist reported it is likely due to her iliotibial band. Patient reports she underwent a left lumpectomy and SNLB (0/4 nodes positive) removed on  04/17/18.. Radiation is now complete     Patient Stated Goals  Reduce breast swelling     Currently in Pain?  Yes    Pain Score  --   did not rate                       [facility] Adult PT Treatment/Exercise - 11/16/18 0001      Exercises   Exercises  Shoulder      Shoulder Exercises: Standing   Other Standing Exercises  with free motion machine, bilateal rows with elbows extended and external rotation walkouts.   Pt did them with no plates  2 sets of 10  Also did external rotation with shoulder abduction to 90 degrees with some manual assist to hold upper arm in place and guide motion     Other Standing Exercises  roll foam roller up the wall for serratus activation then did elbow pull backs with manual assist for left shoudler pull back to better retraction and thoracic rotation       Shoulder Exercises: Pulleys   Flexion  1 minute      Manual Therapy   Manual Lymphatic Drainage (MLD)  short neck, superficial and deep abdominals. right axially nodes, anterior interaxillary anastamosis, left axilla and axillo inguinal anastamsis, left breast then to sidelying for more work on lateral trunkk   pt reports much soreness in left breast today                  PT Long Term Goals - 11/06/18 1449      PT LONG TERM GOAL #1   Title  Patient will demonstrate she has returned to baseline related to left shoulder ROM and function including WNL values of flexion and abduction without pulling    Baseline  06/20/18- 143 flexion, 112 abduction, 10/24/2018 140 degrees flexion and 170 degrees of abduction of left shoulder     Period  Weeks    Status  On-going      PT LONG TERM GOAL #2   Title  pt will report a decrease in seroma sensation in the under arm by at least 50%    Baseline  she feels fullness in the breast and axilla that is firm to touch     Time  4    Period  Weeks    Status  On-going      PT LONG TERM GOAL #3   Title  Pt will be independent with self MLD for the  Lt lateral breast and seroma region    Status  Achieved      PT LONG TERM GOAL #4   Title  Pt will be set up with how to obtain compression garments    Baseline  to be measured for sleeve on 10/29/2018 and for compression bra on 11/05/2018     Status  Achieved      PT LONG TERM GOAL #5   Title  Patient wil verbalize understanding of lymphedema risk reduction practices.    Status  Achieved            Plan - 11/16/18 1214    Clinical Impression Statement  Pt continues to have pain in left breast but left axilla is less full than last session.  Repositioned small dotted foam in compression bra but noted that bra cup appears to be too large to provide compression to breast [name] bra ) Pt will add more foam that she has at home to provide more compression when she gets home     PT Frequency  2x / week    PT Duration  4 weeks    PT Next Visit Plan  continue MLD to Lt lateral breast and seroma region with remedial range of motion exercises to neck upper thoracic spine and trunk. follow up with exercises as needed.        Patient will benefit from skilled therapeutic intervention in order to improve the following deficits and impairments:     Visit Diagnosis: Abnormal posture  Localized edema  Stiffness of left shoulder, not elsewhere classified  Pain in left arm     Problem List Patient Active Problem List   Diagnosis Date Noted  . Genetic testing 04/02/2018  . Family history of breast cancer   . Malignant neoplasm of upper-outer quadrant of left female breast ([place])   . Malignant neoplasm of upper-outer quadrant of left breast in female, estrogen receptor positive ([place]) 03/16/2018  . OA (osteoarthritis) of knee 09/11/2017  . Infrapatellar bursitis of right knee 04/29/2014  . Pneumonia, aspiration ([place]) 12/28/2012  . HYPOTHYROIDISM 11/15/2010  . HYPERLIPIDEMIA 11/15/2010  . DEPRESSION 11/15/2010  . GERD 11/15/2010  . CONSTIPATION 11/15/2010  . GALLSTONES  11/15/2010  . RENAL CYST 11/15/2010  . INSOMNIA UNSPECIFIED 11/15/2010  . COUGH 11/15/2010  . ABDOMINAL PAIN OTHER SPECIFIED SITE 11/15/2010  . TRANSAMINASES, SERUM, ELEVATED 11/15/2010   Donato Heinz. Owens Shark PT  Norwood Levo 11/16/2018, 12:17 PM  [place], [place], [address] Phone: [phone]   Fax:  [phone]  Name: [name] MRN: [id number] Date of Birth: 05-31-1946

## 2018-11-19 DIAGNOSIS — M25661 Stiffness of right knee, not elsewhere classified: Secondary | ICD-10-CM | POA: Diagnosis not present

## 2018-11-19 DIAGNOSIS — R531 Weakness: Secondary | ICD-10-CM | POA: Diagnosis not present

## 2018-11-19 DIAGNOSIS — R262 Difficulty in walking, not elsewhere classified: Secondary | ICD-10-CM | POA: Diagnosis not present

## 2018-11-19 DIAGNOSIS — G5782 Other specified mononeuropathies of left lower limb: Secondary | ICD-10-CM | POA: Diagnosis not present

## 2018-11-20 ENCOUNTER — Other Ambulatory Visit: Payer: Self-pay

## 2018-11-20 DIAGNOSIS — L853 Xerosis cutis: Secondary | ICD-10-CM | POA: Diagnosis not present

## 2018-11-20 DIAGNOSIS — C50412 Malignant neoplasm of upper-outer quadrant of left female breast: Secondary | ICD-10-CM

## 2018-11-20 DIAGNOSIS — L820 Inflamed seborrheic keratosis: Secondary | ICD-10-CM | POA: Diagnosis not present

## 2018-11-20 DIAGNOSIS — L814 Other melanin hyperpigmentation: Secondary | ICD-10-CM | POA: Diagnosis not present

## 2018-11-20 NOTE — Progress Notes (Signed)
[place]  [phone] [phone]) [phone]     ID: [name] DOB: [birth year]  MR#: [id number]  ASN#:[id number]  Patient Care Team: Shon Baton, MD as PCP - General Rolm Bookbinder, MD as Consulting Physician (General Surgery) Xitlali Kastens, Virgie Dad, MD as Consulting Physician (Oncology) Eppie Gibson, MD as Attending Physician (Radiation Oncology) Lelon Perla, MD as Consulting Physician (Cardiology) [name], MD as Consulting Physician (Obstetrics and Gynecology) OTHER MD:  CHIEF COMPLAINT: Estrogen receptor positive breast cancer  CURRENT TREATMENT: tamoxifen    HISTORY OF CURRENT ILLNESS: From the original intake note:  "[name]" had routine diagnostic mammography on 03/08/2018 showing breast density category D.  There was a 1.5 cm irregular mass in the left upper outer quadrant posterior depth.  This was further imaged with ultrasonography on the same day.  The left axilla was sonographically benign.  No abnormalities were seen in the left axilla.  Accordingly on 03/08/2018 she proceeded to biopsy of the left breast area in question. The pathology from this procedure showed ([id number]): Invasive ductal carcinoma, grade I-II. HER-2 negative with the ratio being 1.61 and number per cell 3.30.  The tumor was estrogen receptor 100% positive and progesterone receptor 100% positive, both with strong staining intensity.  Ki67 was <1%.   On 03/18/2018, she underwent a bilateral breast MRI with and without contrast, showing the mass in question in a setting of 3.3 cm non-masslike enhancement in the posterior aspect of the left upper-outer quadrant. in addition there was a 1.1 cm mass in the left upper-outer quadrant located 2.1 cm anterior and lateral to the biopsy proven malignant.   Right breast showed two indeterminate 5 mm adjacent enhancing nodules in the upper-inner quadrant.   The patient's subsequent history is as detailed  below.   INTERVAL HISTORY: [name] returns today for follow-up of her estrogen receptor positive breast cancer. She is accompanied by her husband.  The patient continues on tamoxifen, which she is not tolerating well. She is having major hot flashes, with diaphoresis. She is also experiencing vaginal dryness, that can be painful when walking or sitting, extremely dry eyes, constipation, very dry skin with nail deformity, and fatigue, all of which she contributes to tamoxifen. She does have concerns about the potential side effects of this treatment, such as uterine cancer, especially after speaking to some friends about their breast cancer treatments.    REVIEW OF SYSTEMS: [name] is doing well overall. She is interested in pelvic floor strengthening classes. She has been going to physical therapy for her knee. The patient denies unusual headaches, visual changes, nausea, vomiting, or dizziness. There has been no unusual cough, phlegm production, or pleurisy. This been no change in bladder habits. The patient denies unexplained weight loss, bleeding, rash, or fever. A detailed review of systems was otherwise noncontributory.    PAST MEDICAL HISTORY: Past Medical History:  Diagnosis Date  . Arthritis    hands and knees  . Complication of anesthesia    aspiration pna; following a colonoscopy, pt requests head of bed elevated if possible.  . Constipation   . Cough   . Depression   . Dysrhythmia    RBBB on 06-06-17 ekg   . Elevated liver function tests   . Esophageal stricture   . Family history of breast cancer   . Gallstones   . Genetic testing 04/02/2018   STAT Breast panel with reflex to Multi-Cancer panel (83 genes) @ [facility] - No pathogenic mutations detected  . GERD (gastroesophageal  reflux disease)   . Hiatal hernia   . History of kidney stones   . History of radiation therapy 07/09/2018- 08/03/18   Left Breast/ 40.05 Gy in 15 fractions. Left Breast boost 10 Gy in 5 fractions.    . Hyperlipemia   . Hypothyroidism   . Insomnia   . Malignant neoplasm of upper-outer quadrant of left female breast ([place])   . Nephrolithiasis   . Pneumonia 2015   aspirated after colonosopy  . Renal cyst     PAST SURGICAL HISTORY: Past Surgical History:  Procedure Laterality Date  . BREAST LUMPECTOMY WITH RADIOACTIVE SEED AND SENTINEL LYMPH NODE BIOPSY Left 04/17/2018   Procedure: LEFT BREAST LUMPECTOMY WITH BRACKETED RADIOACTIVE SEEDS AND SENTINEL LYMPH NODE BIOPSY;  Surgeon: Rolm Bookbinder, MD;  Location: [facility];  Service: General;  Laterality: Left;  . BREAST SURGERY Left    Lumpectomy  . CARDIOVASCULAR STRESS TEST  07/19/2006   EF 70%, NO EVIDENCE OF ISCHEMIA  . CHOLECYSTECTOMY  12/30/10  . COLONOSCOPY    . DILATION AND CURETTAGE OF UTERUS    . HYSTEROSCOPY W/D&C N/A 06/07/2018   Procedure: DILATATION AND CURETTAGE /HYSTEROSCOPY;  Surgeon: [name], MD;  Location: [facility];  Service: Gynecology;  Laterality: N/A;  . inguinal herniography    . NASAL SINUS SURGERY    . RADIOACTIVE SEED GUIDED EXCISIONAL BREAST BIOPSY Right 04/17/2018   Procedure: RIGHT BREAST SEED GUIDED EXCISIONAL BIOPSY;  Surgeon: Rolm Bookbinder, MD;  Location: [facility];  Service: General;  Laterality: Right;  . RE-EXCISION OF BREAST LUMPECTOMY Left 05/15/2018   Procedure: RE-EXCISION OF LEFT BREAST LUMPECTOMY, ASPIRATION OF LEFT AXILLARY SEROMA;  Surgeon: Rolm Bookbinder, MD;  Location: [facility];  Service: General;  Laterality: Left;  . T & A    . TOOTH EXTRACTION    . TOTAL KNEE ARTHROPLASTY Left 09/11/2017   Procedure: LEFT TOTAL KNEE ARTHROPLASTY;  Surgeon: Gaynelle Arabian, MD;  Location: [facility];  Service: Orthopedics;  Laterality: Left;  with block  . US ECHOCARDIOGRAPHY  07/17/2006   EF 55-60%    FAMILY HISTORY Family History  Problem Relation Age of Onset  . Breast cancer Mother 58       deceased 24; TAH/BSO in 65s/50s  . Dementia Father         deceased 35  . Breast cancer Maternal Aunt 82       deceased 29s  . Colon cancer Neg Hx   . Stomach cancer Neg Hx    Her father died of old age at 63 years old. Her mother had breast cancer at 90 and died at 72 years old. She will undergo genetic testing. Her maternal aunt had breast cancer at 55 years old. No family history of ovarian cancer. She had one brother and one sister.    GYNECOLOGIC HISTORY:  No LMP recorded. Patient is postmenopausal. Menarche: 72 years old [place] P 0 LMP: 1997 Contraceptive: 2 years HRT: Estrogen and progesterone for about 7 years  Hysterectomy? No   SOCIAL HISTORY:  She is a retired Futures trader. She has been married to [place], for 45 plus years. He worked in the [employer]. Together they adopted two children. One daughter, [name], 14 of [place], who is a Psychologist, sport and exercise for an OBGYN, and one son, Anyelin Mogle, 36 of [place], who owns a Marketing executive business. The patient as three grandchildren. She attends [employer].    ADVANCED DIRECTIVES: In place   HEALTH  MAINTENANCE: Social History   Tobacco Use  . Smoking status: Never Smoker  . Smokeless tobacco: Never Used  Substance Use Topics  . Alcohol use: No  . Drug use: No    Colonoscopy: 2014/ [facility]  PAP: 2018  Bone density:  May [facility] showed a T score of -0.9 normal   Allergies  Allergen Reactions  . Oxycontin [Oxycodone Hcl]     "it made me feel very high and hyper"   . Codeine Nausea And Vomiting    Current Outpatient Medications  Medication Sig Dispense Refill  . acetaminophen (TYLENOL) 500 MG tablet Take 500 mg by mouth every 6 (six) hours as needed for mild pain or headache.    Marland Kitchen azelastine (ASTELIN) 0.1 % nasal spray Place 1 spray into both nostrils 2 (two) times daily. Use in each nostril as directed    . Cholecalciferol (VITAMIN D3) 1000 units CAPS Take 1 capsule by mouth 2 (two) times daily.     Marland Kitchen escitalopram (LEXAPRO)  10 MG tablet Take 15 mg by mouth daily.     Marland Kitchen esomeprazole (NEXIUM) 40 MG capsule Take 40 mg by mouth 2 (two) times daily before a meal.    . ezetimibe (ZETIA) 10 MG tablet Take 10 mg by mouth at bedtime.    . fluticasone (FLONASE) 50 MCG/ACT nasal spray Place 1 spray into both nostrils daily.    Marland Kitchen levothyroxine (SYNTHROID, LEVOTHROID) 50 MCG tablet Take 50 mcg by mouth at bedtime.    . Magnesium 250 MG TABS Take 1 tablet by mouth daily.     . naproxen sodium (ALEVE) 220 MG tablet Take 220 mg by mouth 2 (two) times daily as needed (pain).    Marland Kitchen OVER THE COUNTER MEDICATION Apply 1 application topically daily as needed (pain). Natural pain relief ointment    . Probiotic Product (PROBIOTIC ADVANCED PO) Take 1 tablet by mouth daily.    . rosuvastatin (CRESTOR) 5 MG tablet Take 5 mg by mouth at bedtime.     . sodium chloride (OCEAN) 0.65 % SOLN nasal spray Place 4 sprays into both nostrils 4 (four) times daily.    . SUPER B COMPLEX/C PO Take 1 tablet by mouth daily.    . tamoxifen (NOLVADEX) 20 MG tablet Take 1 tablet (20 mg total) by mouth daily. 90 tablet 4  . traMADol (ULTRAM) 50 MG tablet Take 2 tablets (100 mg total) by mouth every 6 (six) hours as needed. 10 tablet 0  . zolpidem (AMBIEN) 10 MG tablet zolpidem 10 mg tablet  take 1 tablet by mouth at bedtime if needed     No current facility-administered medications for this visit.     OBJECTIVE: Middle-aged white woman   Vitals:   11/21/18 1346  BP: 124/79  Pulse: 67  Resp: 18  Temp: 97.6 F (36.4 C)  SpO2: 99%     Body mass index is 24.78 kg/m.   Wt Readings from Last 3 Encounters:  11/21/18 163 lb (73.9 kg)  09/07/18 164 lb 9.6 oz (74.7 kg)  08/07/18 168 lb 4.8 oz (76.3 kg)      ECOG FS:1 - Symptomatic but completely ambulatory  Sclerae unicteric, pupils round and equal Oropharynx clear and moist No cervical or supraclavicular adenopathy Lungs no rales or rhonchi Heart regular rate and rhythm Abd soft, nontender,  positive bowel sounds MSK no focal spinal tenderness, no upper extremity lymphedema Neuro: nonfocal, well oriented, appropriate affect Breasts: The right breast is status post lumpectomy and radiation.  In the superior aspect of the breast there is a firmer area which feels like a seroma measuring approximately 4 x 2 cm.  In the lateral aspect of the breast associated with the incision there is some scar tissue.  There is no evidence of residual or recurrent disease.  The left breast is unremarkable.  Both axillae are benign  LAB RESULTS:  CMP     Component Value Date/Time   NA 141 11/21/2018 1329   NA 144 06/06/2017 1448   K 4.9 11/21/2018 1329   CL 108 11/21/2018 1329   CO2 25 11/21/2018 1329   GLUCOSE 126 (H) 11/21/2018 1329   BUN 13 11/21/2018 1329   BUN 12 06/06/2017 1448   CREATININE 0.88 11/21/2018 1329   CALCIUM 9.4 11/21/2018 1329   PROT 6.9 11/21/2018 1329   PROT 6.3 06/06/2017 1448   ALBUMIN 4.1 11/21/2018 1329   ALBUMIN 4.4 06/06/2017 1448   AST 74 (H) 11/21/2018 1329   ALT 50 (H) 11/21/2018 1329   ALKPHOS 103 11/21/2018 1329   BILITOT 0.4 11/21/2018 1329   GFRNONAA >60 11/21/2018 1329   GFRAA >60 11/21/2018 1329    No results found for: TOTALPROTELP, ALBUMINELP, A1GS, A2GS, BETS, BETA2SER, GAMS, MSPIKE, SPEI  No results found for: KPAFRELGTCHN, LAMBDASER, KAPLAMBRATIO  Lab Results  Component Value Date   WBC 7.7 11/21/2018   NEUTROABS 3.4 11/21/2018   HGB 13.0 11/21/2018   HCT 40.7 11/21/2018   MCV 88.1 11/21/2018   PLT 232 11/21/2018    '@LASTCHEMISTRY'$ @  No results found for: LABCA2  No components found for: FYBOFB510  No results for input(s): INR in the last 168 hours.  No results found for: LABCA2  No results found for: CHE527  No results found for: POE423  No results found for: NTI144  No results found for: CA2729  No components found for: HGQUANT  No results found for: CEA1 / No results found for: CEA1   No results found for:  AFPTUMOR  No results found for: Edna  No results found for: PSA1  Appointment on 11/21/2018  Component Date Value Ref Range Status  . WBC Count 11/21/2018 7.7  4.0 - 10.5 K/uL Final  . RBC 11/21/2018 4.62  3.87 - 5.11 MIL/uL Final  . Hemoglobin 11/21/2018 13.0  12.0 - 15.0 g/dL Final  . HCT 11/21/2018 40.7  36.0 - 46.0 % Final  . MCV 11/21/2018 88.1  80.0 - 100.0 fL Final  . MCH 11/21/2018 28.1  26.0 - 34.0 pg Final  . MCHC 11/21/2018 31.9  30.0 - 36.0 g/dL Final  . RDW 11/21/2018 14.5  11.5 - 15.5 % Final  . Platelet Count 11/21/2018 232  150 - 400 K/uL Final  . nRBC 11/21/2018 0.0  0.0 - 0.2 % Final  . Neutrophils Relative % 11/21/2018 44  % Final  . Neutro Abs 11/21/2018 3.4  1.7 - 7.7 K/uL Final  . Lymphocytes Relative 11/21/2018 42  % Final  . Lymphs Abs 11/21/2018 3.3  0.7 - 4.0 K/uL Final  . Monocytes Relative 11/21/2018 8  % Final  . Monocytes Absolute 11/21/2018 0.6  0.1 - 1.0 K/uL Final  . Eosinophils Relative 11/21/2018 5  % Final  . Eosinophils Absolute 11/21/2018 0.4  0.0 - 0.5 K/uL Final  . Basophils Relative 11/21/2018 1  % Final  . Basophils Absolute 11/21/2018 0.1  0.0 - 0.1 K/uL Final  . Immature Granulocytes 11/21/2018 0  % Final  . Abs Immature Granulocytes 11/21/2018 0.01  0.00 -  0.07 K/uL Final   Performed at [facility], [place] [address] [place], [place] [address]  . Sodium 11/21/2018 141  135 - 145 mmol/L Final  . Potassium 11/21/2018 4.9  3.5 - 5.1 mmol/L Final  . Chloride 11/21/2018 108  98 - 111 mmol/L Final  . CO2 11/21/2018 25  22 - 32 mmol/L Final  . Glucose, Bld 11/21/2018 126* 70 - 99 mg/dL Final  . BUN 11/21/2018 13  8 - 23 mg/dL Final  . Creatinine 11/21/2018 0.88  0.44 - 1.00 mg/dL Final  . Calcium 11/21/2018 9.4  8.9 - 10.3 mg/dL Final  . Total Protein 11/21/2018 6.9  6.5 - 8.1 g/dL Final  . Albumin 11/21/2018 4.1  3.5 - 5.0 g/dL Final  . AST 11/21/2018 74* 15 - 41 U/L Final  . ALT 11/21/2018 50* 0 - 44 U/L  Final  . Alkaline Phosphatase 11/21/2018 103  38 - 126 U/L Final  . Total Bilirubin 11/21/2018 0.4  0.3 - 1.2 mg/dL Final  . GFR, Est Non Af Am 11/21/2018 >60  >60 mL/min Final  . GFR, Est AFR Am 11/21/2018 >60  >60 mL/min Final  . Anion gap 11/21/2018 8  5 - 15 Final   Performed at [facility], [address] [place], [place] [address]    (this displays the last labs from the last 3 days)  No results found for: TOTALPROTELP, ALBUMINELP, A1GS, A2GS, BETS, BETA2SER, GAMS, MSPIKE, SPEI (this displays SPEP labs)  No results found for: KPAFRELGTCHN, LAMBDASER, KAPLAMBRATIO (kappa/lambda light chains)  No results found for: HGBA, HGBA2QUANT, HGBFQUANT, HGBSQUAN (Hemoglobinopathy evaluation)   No results found for: LDH  No results found for: IRON, TIBC, IRONPCTSAT (Iron and TIBC)  No results found for: FERRITIN  Urinalysis    Component Value Date/Time   COLORURINE YELLOW 08/22/2016 1947   APPEARANCEUR CLEAR 08/22/2016 1947   LABSPEC 1.026 08/22/2016 1947   PHURINE 5.5 08/22/2016 1947   GLUCOSEU NEGATIVE 08/22/2016 1947   HGBUR SMALL (A) 08/22/2016 1947   BILIRUBINUR NEGATIVE 08/22/2016 [place] NEGATIVE 08/22/2016 1947   PROTEINUR NEGATIVE 08/22/2016 1947   UROBILINOGEN 0.2 08/22/2016 1622   NITRITE NEGATIVE 08/22/2016 1947   LEUKOCYTESUR SMALL (A) 08/22/2016 1947     STUDIES: No results found.   ASSESSMENT: 72 y.o. [place] woman status post left breast upper outer quadrant biopsy 03/08/2018 for a clinical T1c-T2 N0, stage I invasive ductal carcinoma, grade 1 or 2, estrogen and progesterone receptor positive, HER-2 not amplified, with an MIB-1 of less than 1%  (1) additional biopsies as follows:  (a) right breast biopsy ([id number]) on 04/02/2018 showed Atypical lobular hyperplasia  (b) left breast ([id number]) on 03/22/2018 showed invasive lobular carcinoma, grade 2, estrogen and progesterone receptor positive with an MIB-1 of 5%.  HER-2 not amplified  (2) bilateral lumpectomies and left sentinel lymph node sampling 04/17/2018 found  (a) on the right side, atypical lobular hyperplasia  (b) on the left side, an mpT1c pN0, stage IA, grade 2 invasive lobular carcinoma, with positive margins  (c) additional surgery on 05/15/2018 cleared the compromise margins  (3) The Oncotype DX score was 14, predicting a risk of outside the breast recurrence over the next 9 years of 4% if the patient's only systemic therapy is tamoxifen for 5 years.  It also predicts no significant benefit from chemotherapy.  (4) adjuvant radiation 07/09/2018 - 08/03/2018 Site/dose:    1. Left Breast / 40.05 Gy in 15 fractions 2. Left Breast  Boost / 10 Gy in 5 fractions  (5) started tamoxifen 09/11/2018  (a) bone density May 2014 normal, T score -0.9  (6) genetics testing 03/22/2018 through [facility]'s STAT Breast panel with reflex to Multi-Cancer panel found no pathogenic mutations in (ALK, APC, ATM, AXIN2, BAP1, BARD1, BLM, BMPR1A, BRCA1, BRCA2, BRIP1, CASR, CDC73, CDH1, CDK4, CDKN1B, CDKN1C, CDKN2A, CEBPA, CHEK2, CTNNA1, DICER1, DIS3L2, EGFR, EPCAM, FH, FLCN, GATA2, GPC3, GREM1, HOXB13, HRAS, KIT, MAX, MEN1, MET, MITF, MLH1, MSH2, MSH3, MSH6, MUTYH, NBN, NF1, NF2, NTHL1, PALB2, PDGFRA, PHOX2B, PMS2, POLD1, POLE, POT1, PRKAR1A, PTCH1, PTEN, RAD50, RAD51C, RAD51D, RB1, RECQL4, RET, RUNX1, SDHA, SDHAF2, SDHB, SDHC, SDHD, SMAD4, SMARCA4, SMARCB1, SMARCE1, STK11, SUFU, TERC, TERT, TMEM127, TP53, TSC1, TSC2, VHL, WRN, WT1).  (a) Variants of Uncertain Significance were found in in PDGFRA and RECQL4.   PLAN: [name] is having significant hot flashes secondary to the tamoxifen.  I am hoping these will improve.  She does feel that they are lessening somewhat.  It may be worthwhile waiting another 6 weeks or so to see if they abate sufficiently for her to be able to continue the medication.  I am not persuaded that her dry eyes, dry skin and constipation are related  to the tamoxifen, but they possibly could be.  If they persist that would be more convincing and for that reason I have asked her to call us last week in January and let us know how she is doing with regards to those symptoms.  The vaginal dryness of course is going to be related to menopause and I am referring her to the physical therapy program regarding that  She is complaining of "chemo brain" although of course she has not had chemo.  We discussed the fact that this is a complex cognitive issue and that has to do with stress, posttraumatic stress, medications, and residuals from the procedures she has had including surgery and radiation.  The good news is that eventually things do get better but it may take a while  I think [name] will benefit greatly from the finding your new normal program  I do not think she is ready for survivorship and I have canceled that appointment already scheduled for February.  I gave her again information on the aromatase inhibitors as compared to tamoxifen.  Obviously if she does not tolerate tamoxifen we would go to 1 of those and she mentioned letrozole today as a possibility  She knows to call for any other issues that may develop before the next visit   Ziare Cryder, Virgie Dad, MD  11/21/18 2:39 PM Medical Oncology and Hematology [facility] [place], [place] [address] Tel. [phone]    Fax. [phone]   I, Jacqualyn Posey am acting as a Education administrator for Chauncey Cruel, MD.   I, Lurline Del MD, have reviewed the above documentation for accuracy and completeness, and I agree with the above.

## 2018-11-21 ENCOUNTER — Inpatient Hospital Stay: Payer: Medicare Other | Attending: Oncology

## 2018-11-21 ENCOUNTER — Inpatient Hospital Stay (HOSPITAL_BASED_OUTPATIENT_CLINIC_OR_DEPARTMENT_OTHER): Payer: Medicare Other | Admitting: Oncology

## 2018-11-21 ENCOUNTER — Telehealth: Payer: Self-pay | Admitting: Oncology

## 2018-11-21 ENCOUNTER — Encounter: Payer: Self-pay | Admitting: Physical Therapy

## 2018-11-21 ENCOUNTER — Ambulatory Visit: Payer: Medicare Other | Admitting: Physical Therapy

## 2018-11-21 VITALS — BP 124/79 | HR 67 | Temp 97.6°F | Resp 18 | Ht 68.0 in | Wt 163.0 lb

## 2018-11-21 DIAGNOSIS — Z923 Personal history of irradiation: Secondary | ICD-10-CM | POA: Insufficient documentation

## 2018-11-21 DIAGNOSIS — Z170 Estrogen receptor positive status [ER+]: Secondary | ICD-10-CM | POA: Insufficient documentation

## 2018-11-21 DIAGNOSIS — Z79899 Other long term (current) drug therapy: Secondary | ICD-10-CM | POA: Diagnosis not present

## 2018-11-21 DIAGNOSIS — M25612 Stiffness of left shoulder, not elsewhere classified: Secondary | ICD-10-CM

## 2018-11-21 DIAGNOSIS — Z79810 Long term (current) use of selective estrogen receptor modulators (SERMs): Secondary | ICD-10-CM

## 2018-11-21 DIAGNOSIS — R293 Abnormal posture: Secondary | ICD-10-CM | POA: Diagnosis not present

## 2018-11-21 DIAGNOSIS — R600 Localized edema: Secondary | ICD-10-CM | POA: Diagnosis not present

## 2018-11-21 DIAGNOSIS — M6281 Muscle weakness (generalized): Secondary | ICD-10-CM | POA: Diagnosis not present

## 2018-11-21 DIAGNOSIS — C50412 Malignant neoplasm of upper-outer quadrant of left female breast: Secondary | ICD-10-CM | POA: Diagnosis not present

## 2018-11-21 DIAGNOSIS — M79602 Pain in left arm: Secondary | ICD-10-CM

## 2018-11-21 LAB — CBC WITH DIFFERENTIAL (CANCER CENTER ONLY)
ABS IMMATURE GRANULOCYTES: 0.01 10*3/uL (ref 0.00–0.07)
Basophils Absolute: 0.1 10*3/uL (ref 0.0–0.1)
Basophils Relative: 1 %
EOS ABS: 0.4 10*3/uL (ref 0.0–0.5)
EOS PCT: 5 %
HEMATOCRIT: 40.7 % (ref 36.0–46.0)
Hemoglobin: 13 g/dL (ref 12.0–15.0)
Immature Granulocytes: 0 %
Lymphocytes Relative: 42 %
Lymphs Abs: 3.3 10*3/uL (ref 0.7–4.0)
MCH: 28.1 pg (ref 26.0–34.0)
MCHC: 31.9 g/dL (ref 30.0–36.0)
MCV: 88.1 fL (ref 80.0–100.0)
MONO ABS: 0.6 10*3/uL (ref 0.1–1.0)
Monocytes Relative: 8 %
NEUTROS ABS: 3.4 10*3/uL (ref 1.7–7.7)
NEUTROS PCT: 44 %
NRBC: 0 % (ref 0.0–0.2)
PLATELETS: 232 10*3/uL (ref 150–400)
RBC: 4.62 MIL/uL (ref 3.87–5.11)
RDW: 14.5 % (ref 11.5–15.5)
WBC Count: 7.7 10*3/uL (ref 4.0–10.5)

## 2018-11-21 LAB — CMP (CANCER CENTER ONLY)
ALBUMIN: 4.1 g/dL (ref 3.5–5.0)
ALK PHOS: 103 U/L (ref 38–126)
ALT: 50 U/L — ABNORMAL HIGH (ref 0–44)
AST: 74 U/L — AB (ref 15–41)
Anion gap: 8 (ref 5–15)
BILIRUBIN TOTAL: 0.4 mg/dL (ref 0.3–1.2)
BUN: 13 mg/dL (ref 8–23)
CO2: 25 mmol/L (ref 22–32)
CREATININE: 0.88 mg/dL (ref 0.44–1.00)
Calcium: 9.4 mg/dL (ref 8.9–10.3)
Chloride: 108 mmol/L (ref 98–111)
GFR, Estimated: >60 mL/min (ref >60)
GLUCOSE: 126 mg/dL — AB (ref 70–99)
Potassium: 4.9 mmol/L (ref 3.5–5.1)
Sodium: 141 mmol/L (ref 135–145)
Total Protein: 6.9 g/dL (ref 6.5–8.1)

## 2018-11-21 MED ORDER — TAMOXIFEN CITRATE 20 MG PO TABS: 20.0000 mg | ORAL_TABLET | Freq: Every day | ORAL | 4 refills | Status: DC

## 2018-11-21 NOTE — Patient Instructions (Signed)
Target Heart rate for moderate exercise is between 102- 118   220-age = max heart rate    220-72=148   Max HR- resting HR= heart rate reserve 148       -  75       = 73  Moderate exercise heart rate is  Between 40 % and 60% of heart rate reserve   + resting heart rate                                                          40% of 73 + 75 = 102                                         60% of 73 +75 = 118

## 2018-11-21 NOTE — Therapy (Signed)
[place], [place], [address] Phone: [phone]   Fax:  [phone]  Physical Therapy Treatment  Patient Details  Name: [name] MRN: [id number] Date of Birth: 1946-08-30 Referring Provider (PT): Dr. Donne Hazel    Encounter Date: 11/21/2018  PT End of Session - 11/21/18 1248    Visit Number  17    Number of Visits  25    Date for PT Re-Evaluation  11/23/18    Authorization Type  medicare/tricare    PT Start Time  1100    PT Stop Time  1145    PT Time Calculation (min)  45 min    Activity Tolerance  Patient tolerated treatment well    Behavior During Therapy  [facility] for tasks assessed/performed       Past Medical History:  Diagnosis Date  . Arthritis    hands and knees  . Complication of anesthesia    aspiration pna; following a colonoscopy, pt requests head of bed elevated if possible.  . Constipation   . Cough   . Depression   . Dysrhythmia    RBBB on 06-06-17 ekg   . Elevated liver function tests   . Esophageal stricture   . Family history of breast cancer   . Gallstones   . Genetic testing 04/02/2018   STAT Breast panel with reflex to Multi-Cancer panel (83 genes) @ [facility] - No pathogenic mutations detected  . GERD (gastroesophageal reflux disease)   . Hiatal hernia   . History of kidney stones   . History of radiation therapy 07/09/2018- 08/03/18   Left Breast/ 40.05 Gy in 15 fractions. Left Breast boost 10 Gy in 5 fractions.   . Hyperlipemia   . Hypothyroidism   . Insomnia   . Malignant neoplasm of upper-outer quadrant of left female breast ([place])   . Nephrolithiasis   . Pneumonia 2015   aspirated after colonosopy  . Renal cyst     Past Surgical History:  Procedure Laterality Date  . BREAST LUMPECTOMY WITH RADIOACTIVE SEED AND SENTINEL LYMPH NODE BIOPSY Left 04/17/2018   Procedure: LEFT BREAST LUMPECTOMY WITH BRACKETED RADIOACTIVE SEEDS AND SENTINEL LYMPH NODE BIOPSY;   Surgeon: Rolm Bookbinder, MD;  Location: [place];  Service: General;  Laterality: Left;  . BREAST SURGERY Left    Lumpectomy  . CARDIOVASCULAR STRESS TEST  07/19/2006   EF 70%, NO EVIDENCE OF ISCHEMIA  . CHOLECYSTECTOMY  12/30/10  . COLONOSCOPY    . DILATION AND CURETTAGE OF UTERUS    . HYSTEROSCOPY W/D&C N/A 06/07/2018   Procedure: DILATATION AND CURETTAGE /HYSTEROSCOPY;  Surgeon: [name], MD;  Location: [facility];  Service: Gynecology;  Laterality: N/A;  . inguinal herniography    . NASAL SINUS SURGERY    . RADIOACTIVE SEED GUIDED EXCISIONAL BREAST BIOPSY Right 04/17/2018   Procedure: RIGHT BREAST SEED GUIDED EXCISIONAL BIOPSY;  Surgeon: Rolm Bookbinder, MD;  Location: [place];  Service: General;  Laterality: Right;  . RE-EXCISION OF BREAST LUMPECTOMY Left 05/15/2018   Procedure: RE-EXCISION OF LEFT BREAST LUMPECTOMY, ASPIRATION OF LEFT AXILLARY SEROMA;  Surgeon: Rolm Bookbinder, MD;  Location: [place];  Service: General;  Laterality: Left;  . T & A    . TOOTH EXTRACTION    . TOTAL KNEE ARTHROPLASTY Left 09/11/2017   Procedure: LEFT TOTAL KNEE ARTHROPLASTY;  Surgeon: Gaynelle Arabian, MD;  Location: [facility];  Service: Orthopedics;  Laterality: Left;  with block  . US ECHOCARDIOGRAPHY  07/17/2006   EF 55-60%    There were no vitals filed for this visit.  Subjective Assessment - 11/21/18 1106    Subjective  Pt is going back to see Dr. Jana Hakim today and has some questions about her medications and her survivorship plan     Pertinent History  Patient was diagnosed on 03/08/18 with left grade 1-2 invasive ductal carcinoma breast cancer.It measured 1.5 cm and is located in the upper outer quadrant. It is ER/PR positive and HER2 negative with a Ki67 of < 1%.  She had a left total knee replacement in 10/18 with Dr. Wynelle Link and continues to have pain in her left lateral knee. Her orthopedist reported it is likely due to her iliotibial band.  Patient reports she underwent a left lumpectomy and SNLB (0/4 nodes positive) removed on 04/17/18.. Radiation is now complete     Patient Stated Goals  Reduce breast swelling     Currently in Pain?  Yes    Pain Score  --   did not rate                       [facility] Adult PT Treatment/Exercise - 11/21/18 0001      Self-Care   Other Self-Care Comments   reviewed use of compression bras and swell spts to manage breast and axillary fullness.  She continues to have areas of fullness in left central upper breast and around axillary scar, but it is less than previously.       Exercises   Exercises  Other Exercises    Other Exercises   discussed importance of exrcise and instroduced pt to Strength ABC program that she could do at her fitness center.  Calculated her target heart rate range for moderate exercise to be 102-118 beats per minute and pt understood.  She felt that she could check her HR while she is on the bike.  She has been doing the rows at the gym but feels that she is having soreness at her axilla.She was using 5# for 2 sets of 15.  Suggested she back to down to 2 sets of 10 as she should not have pain for > 24 hours after exercise.                   PT Long Term Goals - 11/21/18 1146      PT LONG TERM GOAL #1   Title  Patient will demonstrate she has returned to baseline related to left shoulder ROM and function including WNL values of flexion and abduction without pulling    Baseline  06/20/18- 143 flexion, 112 abduction, 10/24/2018 140 degrees flexion and 170 degrees of abduction of left shoulder , 21/10/2018  155 degrees of flexion with pulling     Time  4    Period  Weeks    Status  On-going      PT LONG TERM GOAL #2   Title  pt will report a decrease in seroma sensation in the under arm by at least 50%    Baseline  she feels fullness in the breast and axilla that is firm to touch  12/11 2019 decreased, but still fullness at the scar     Status   Achieved      PT LONG TERM GOAL #3   Title  Pt will be independent with self MLD for the Lt lateral breast and seroma region    Status  Achieved  PT LONG TERM GOAL #4   Title  Pt will be set up with how to obtain compression garments    Baseline  to be measured for sleeve on 10/29/2018 and for compression bra on 11/05/2018  11/21/2018. pt does not have sleeve yet     Status  Achieved      PT LONG TERM GOAL #5   Title  Patient wil verbalize understanding of lymphedema risk reduction practices.    Status  Achieved      PT LONG TERM GOAL #6   Title  sit to stand 5 times </= 13 seconds due to improve strength and endurance    Status  Deferred            Plan - 11/21/18 1248    Clinical Impression Statement  Pt continues to have soreness in left breast and axilla and "pulling" with overhead reaching. Reviewed ways for self management of lymphedema and exercise progression, but pt continues to need more instruction.  She is not ready to discharge from PT yet     Clinical Impairments Affecting Rehab Potential  None    PT Frequency  2x / week    PT Duration  4 weeks    PT Treatment/Interventions  ADLs/Self Care Home Management;Therapeutic activities;Therapeutic exercise;Patient/family education;Manual techniques;Manual lymph drainage;Scar mobilization;Passive range of motion    PT Next Visit Plan  Renew for 4 more weeks.  Start teaching strength ABC program continue MLD to Lt lateral breast and seroma region with remedial range of motion exercises to neck upper thoracic spine and trunk. follow up with exercises as needed.        Patient will benefit from skilled therapeutic intervention in order to improve the following deficits and impairments:  Decreased knowledge of precautions, Impaired UE functional use, Decreased range of motion, Postural dysfunction, Decreased scar mobility, Decreased mobility, Pain, Increased fascial restricitons, Increased edema  Visit Diagnosis: Abnormal  posture  Localized edema  Stiffness of left shoulder, not elsewhere classified  Pain in left arm  Muscle weakness (generalized)     Problem List Patient Active Problem List   Diagnosis Date Noted  . Genetic testing 04/02/2018  . Family history of breast cancer   . Malignant neoplasm of upper-outer quadrant of left female breast ([place])   . Malignant neoplasm of upper-outer quadrant of left breast in female, estrogen receptor positive ([place]) 03/16/2018  . OA (osteoarthritis) of knee 09/11/2017  . Infrapatellar bursitis of right knee 04/29/2014  . Pneumonia, aspiration ([place]) 12/28/2012  . HYPOTHYROIDISM 11/15/2010  . HYPERLIPIDEMIA 11/15/2010  . DEPRESSION 11/15/2010  . GERD 11/15/2010  . CONSTIPATION 11/15/2010  . GALLSTONES 11/15/2010  . RENAL CYST 11/15/2010  . INSOMNIA UNSPECIFIED 11/15/2010  . COUGH 11/15/2010  . ABDOMINAL PAIN OTHER SPECIFIED SITE 11/15/2010  . TRANSAMINASES, SERUM, ELEVATED 11/15/2010   Donato Heinz. Owens Shark PT  Norwood Levo 11/21/2018, 12:51 PM  [place], [place], [address] Phone: [phone]   Fax:  [phone]  Name: [name] MRN: [id number] Date of Birth: 02/24/1946

## 2018-11-21 NOTE — Telephone Encounter (Signed)
Gave avs and calendar ° °

## 2018-11-22 DIAGNOSIS — R262 Difficulty in walking, not elsewhere classified: Secondary | ICD-10-CM | POA: Diagnosis not present

## 2018-11-22 DIAGNOSIS — M25661 Stiffness of right knee, not elsewhere classified: Secondary | ICD-10-CM | POA: Diagnosis not present

## 2018-11-22 DIAGNOSIS — G5782 Other specified mononeuropathies of left lower limb: Secondary | ICD-10-CM | POA: Diagnosis not present

## 2018-11-22 DIAGNOSIS — R531 Weakness: Secondary | ICD-10-CM | POA: Diagnosis not present

## 2018-11-27 ENCOUNTER — Ambulatory Visit: Payer: Medicare Other | Admitting: Physical Therapy

## 2018-11-27 ENCOUNTER — Encounter: Payer: Self-pay | Admitting: Physical Therapy

## 2018-11-27 DIAGNOSIS — R600 Localized edema: Secondary | ICD-10-CM

## 2018-11-27 DIAGNOSIS — R293 Abnormal posture: Secondary | ICD-10-CM | POA: Diagnosis not present

## 2018-11-27 DIAGNOSIS — M79602 Pain in left arm: Secondary | ICD-10-CM

## 2018-11-27 DIAGNOSIS — M6281 Muscle weakness (generalized): Secondary | ICD-10-CM

## 2018-11-27 DIAGNOSIS — M25612 Stiffness of left shoulder, not elsewhere classified: Secondary | ICD-10-CM | POA: Diagnosis not present

## 2018-11-27 NOTE — Patient Instructions (Signed)
[email or web address] Courses Online Strength After Breast Cancer Look at the right of the page for Lymphedema Education Session  

## 2018-11-27 NOTE — Therapy (Signed)
[name], [place], [address] Phone: [phone]   Fax:  [phone]  Physical Therapy Treatment  Patient Details  Name: [name] MRN: [id number] Date of Birth: [birth year] Referring Provider (PT): Dr. Donne [name]    Encounter Date: 11/27/2018  PT End of Session - 11/27/18 1723    Visit Number  18    Number of Visits  25    Date for PT Re-Evaluation  12/24/18    PT Start Time  1300    PT Stop Time  1345    PT Time Calculation (min)  45 min    Activity Tolerance  Patient tolerated treatment well    Behavior During Therapy  [facility] for tasks assessed/performed       Past Medical History:  Diagnosis Date  . Arthritis    hands and knees  . Complication of anesthesia    aspiration pna; following a colonoscopy, pt requests head of bed elevated if possible.  . Constipation   . Cough   . Depression   . Dysrhythmia    RBBB on 06-06-17 ekg   . Elevated liver function tests   . Esophageal stricture   . Family history of breast cancer   . Gallstones   . Genetic testing 04/02/2018   STAT Breast panel with reflex to Multi-Cancer panel (83 genes) @ [facility] - No pathogenic mutations detected  . GERD (gastroesophageal reflux disease)   . Hiatal hernia   . History of kidney stones   . History of radiation therapy 07/09/2018- 08/03/18   Left Breast/ 40.05 Gy in 15 fractions. Left Breast boost 10 Gy in 5 fractions.   . Hyperlipemia   . Hypothyroidism   . Insomnia   . Malignant neoplasm of upper-outer quadrant of left female breast ([place])   . Nephrolithiasis   . Pneumonia 2015   aspirated after colonosopy  . Renal cyst     Past Surgical History:  Procedure Laterality Date  . BREAST LUMPECTOMY WITH RADIOACTIVE SEED AND SENTINEL LYMPH NODE BIOPSY Left 04/17/2018   Procedure: LEFT BREAST LUMPECTOMY WITH BRACKETED RADIOACTIVE SEEDS AND SENTINEL LYMPH NODE BIOPSY;  Surgeon: Rolm Bookbinder, MD;  Location:  [facility];  Service: General;  Laterality: Left;  . BREAST SURGERY Left    Lumpectomy  . CARDIOVASCULAR STRESS TEST  07/19/2006   EF 70%, NO EVIDENCE OF ISCHEMIA  . CHOLECYSTECTOMY  12/30/10  . COLONOSCOPY    . DILATION AND CURETTAGE OF UTERUS    . HYSTEROSCOPY W/D&C N/A 06/07/2018   Procedure: DILATATION AND CURETTAGE /HYSTEROSCOPY;  Surgeon: [name], MD;  Location: [facility];  Service: Gynecology;  Laterality: N/A;  . inguinal herniography    . NASAL SINUS SURGERY    . RADIOACTIVE SEED GUIDED EXCISIONAL BREAST BIOPSY Right 04/17/2018   Procedure: RIGHT BREAST SEED GUIDED EXCISIONAL BIOPSY;  Surgeon: Rolm Bookbinder, MD;  Location: [facility];  Service: General;  Laterality: Right;  . RE-EXCISION OF BREAST LUMPECTOMY Left 05/15/2018   Procedure: RE-EXCISION OF LEFT BREAST LUMPECTOMY, ASPIRATION OF LEFT AXILLARY SEROMA;  Surgeon: Rolm Bookbinder, MD;  Location: [facility];  Service: General;  Laterality: Left;  . T & A    . TOOTH EXTRACTION    . TOTAL KNEE ARTHROPLASTY Left 09/11/2017   Procedure: LEFT TOTAL KNEE ARTHROPLASTY;  Surgeon: Gaynelle Arabian, MD;  Location: [facility];  Service: Orthopedics;  Laterality: Left;  with block  . US ECHOCARDIOGRAPHY  07/17/2006   EF 55-60%  There were no vitals filed for this visit.  Subjective Assessment - 11/27/18 1306    Subjective  Pt had her visit with Dr. Jana Hakim and they for her to stay on Tamoxifen til the end of January. She states she is feeling better. She was diagnosed with tendinitis in her knee.  the pain in her left axilla is still present, but not any worse     Pertinent History  Patient was diagnosed on 03/08/18 with left grade 1-2 invasive ductal carcinoma breast cancer.It measured 1.5 cm and is located in the upper outer quadrant. It is ER/PR positive and HER2 negative with a Ki67 of < 1%.  She had a left total knee replacement in 10/18 with Dr. Wynelle Link and continues to have pain in her  left lateral knee. Her orthopedist reported it is likely due to her iliotibial band. Patient reports she underwent a left lumpectomy and SNLB (0/4 nodes positive) removed on 04/17/18.. Radiation is now complete     Patient Stated Goals  Reduce breast swelling     Currently in Pain?  Yes    Pain Score  4     Pain Location  Axilla         [facility] PT Assessment - 11/27/18 0001      Assessment   Medical Diagnosis  s/p left lumpectomy and SLNB    Referring Provider (PT)  Dr. Donne [name]     Onset Date/Surgical Date  04/17/18      Prior Function   Level of Independence  Independent                   [facility] Adult PT Treatment/Exercise - 11/27/18 0001      Exercises   Exercises  Other Exercises    Other Exercises   instruced in and reviewed all exercises in strength ABC program. Pt was given link for online eduction program.  She is famliar with most of the exercises and will be able to practice them at her fitness center                   PT Long Term Goals - 11/27/18 1311      PT LONG TERM GOAL #1   Title  Patient will demonstrate she has returned to baseline related to left shoulder ROM and function including WNL values of flexion and abduction without pulling    Baseline  06/20/18- 143 flexion, 112 abduction, 10/24/2018 140 degrees flexion and 170 degrees of abduction of left shoulder , 21/10/2018  155 degrees of flexion with pulling     Time  4    Period  Weeks    Status  On-going      PT LONG TERM GOAL #2   Title  pt will report a decrease in seroma sensation in the under arm by at least 50%    Baseline  she feels fullness in the breast and axilla that is firm to touch  12/11 2019 decreased, but still fullness at the scar     Time  4    Status  Achieved      PT LONG TERM GOAL #3   Title  Pt will be independent with self MLD for the Lt lateral breast and seroma region    Status  Achieved      PT LONG TERM GOAL #4   Title  Pt will be set up with how to  obtain compression garments    Baseline  to be measured for sleeve  on 10/29/2018 and for compression bra on 11/05/2018  11/21/2018. pt does not have sleeve yet     Status  Achieved      PT LONG TERM GOAL #5   Title  Patient wil verbalize understanding of lymphedema risk reduction practices.    Status  Achieved      PT LONG TERM GOAL #6   Title  sit to stand 5 times </= 13 seconds due to improve strength and endurance    Status  Deferred            Plan - 11/27/18 1724    Clinical Impression Statement  [name] is doing well. She still has pain in left breast and axilla but is using compression and self MLD.  Upgraded to Strength ABC program today and pt feels that she will be able to transition to doing it at her fitness center.  Sent renewal for a few more visits until she is confident in doing that.     Clinical Impairments Affecting Rehab Potential  None    PT Frequency  2x / week    PT Duration  4 weeks    PT Treatment/Interventions  ADLs/Self Care Home Management;Therapeutic activities;Therapeutic exercise;Patient/family education;Manual techniques;Manual lymph drainage;Scar mobilization;Passive range of motion    PT Next Visit Plan  .  Review strength ABC program continue MLD to Lt lateral breast and seroma region with remedial range of motion exercises to neck upper thoracic spine and trunk. follow up with exercises as needed.     Consulted and Agree with Plan of Care  Patient       Patient will benefit from skilled therapeutic intervention in order to improve the following deficits and impairments:  Decreased knowledge of precautions, Impaired UE functional use, Decreased range of motion, Postural dysfunction, Decreased scar mobility, Decreased mobility, Pain, Increased fascial restricitons, Increased edema  Visit Diagnosis: Abnormal posture - Plan: PT plan of care cert/re-cert  Localized edema - Plan: PT plan of care cert/re-cert  Stiffness of left shoulder, not elsewhere  classified - Plan: PT plan of care cert/re-cert  Pain in left arm - Plan: PT plan of care cert/re-cert  Muscle weakness (generalized) - Plan: PT plan of care cert/re-cert     Problem List Patient Active Problem List   Diagnosis Date Noted  . Genetic testing 04/02/2018  . Family history of breast cancer   . Malignant neoplasm of upper-outer quadrant of left female breast ([place])   . Malignant neoplasm of upper-outer quadrant of left breast in female, estrogen receptor positive ([place]) 03/16/2018  . OA (osteoarthritis) of knee 09/11/2017  . Infrapatellar bursitis of right knee 04/29/2014  . Pneumonia, aspiration ([place]) 12/28/2012  . HYPOTHYROIDISM 11/15/2010  . HYPERLIPIDEMIA 11/15/2010  . DEPRESSION 11/15/2010  . GERD 11/15/2010  . CONSTIPATION 11/15/2010  . GALLSTONES 11/15/2010  . RENAL CYST 11/15/2010  . INSOMNIA UNSPECIFIED 11/15/2010  . COUGH 11/15/2010  . ABDOMINAL PAIN OTHER SPECIFIED SITE 11/15/2010  . TRANSAMINASES, SERUM, ELEVATED 11/15/2010   Donato Heinz. Owens Shark PT  [place] Levo 11/27/2018, 5:28 PM  [place], [place], [address] Phone: [phone]   Fax:  [phone]  Name: MARLEEN MORET MRN: [id number] Date of Birth: [birth year]

## 2018-11-28 DIAGNOSIS — M25661 Stiffness of right knee, not elsewhere classified: Secondary | ICD-10-CM | POA: Diagnosis not present

## 2018-11-28 DIAGNOSIS — R531 Weakness: Secondary | ICD-10-CM | POA: Diagnosis not present

## 2018-11-28 DIAGNOSIS — R262 Difficulty in walking, not elsewhere classified: Secondary | ICD-10-CM | POA: Diagnosis not present

## 2018-11-28 DIAGNOSIS — G5782 Other specified mononeuropathies of left lower limb: Secondary | ICD-10-CM | POA: Diagnosis not present

## 2018-11-29 ENCOUNTER — Ambulatory Visit: Payer: Medicare Other | Admitting: Physical Therapy

## 2018-11-29 ENCOUNTER — Encounter: Payer: Self-pay | Admitting: Physical Therapy

## 2018-11-29 DIAGNOSIS — M79602 Pain in left arm: Secondary | ICD-10-CM

## 2018-11-29 DIAGNOSIS — M25612 Stiffness of left shoulder, not elsewhere classified: Secondary | ICD-10-CM

## 2018-11-29 DIAGNOSIS — R600 Localized edema: Secondary | ICD-10-CM | POA: Diagnosis not present

## 2018-11-29 DIAGNOSIS — R293 Abnormal posture: Secondary | ICD-10-CM | POA: Diagnosis not present

## 2018-11-29 DIAGNOSIS — M6281 Muscle weakness (generalized): Secondary | ICD-10-CM

## 2018-11-29 NOTE — Therapy (Signed)
[place], [place], [address] Phone: [phone]   Fax:  [phone]  Physical Therapy Treatment  Patient Details  Name: [name] MRN: [id number] Date of Birth: [birth year] Referring Provider (PT): Dr. Donne Hazel    Encounter Date: 11/29/2018  PT End of Session - 11/29/18 1417    Visit Number  19    Number of Visits  25    Date for PT Re-Evaluation  12/24/18    PT Start Time  1300    PT Stop Time  1345    PT Time Calculation (min)  45 min    Activity Tolerance  Patient tolerated treatment well    Behavior During Therapy  [facility] for tasks assessed/performed       Past Medical History:  Diagnosis Date  . Arthritis    hands and knees  . Complication of anesthesia    aspiration pna; following a colonoscopy, pt requests head of bed elevated if possible.  . Constipation   . Cough   . Depression   . Dysrhythmia    RBBB on 06-06-17 ekg   . Elevated liver function tests   . Esophageal stricture   . Family history of breast cancer   . Gallstones   . Genetic testing 04/02/2018   STAT Breast panel with reflex to Multi-Cancer panel (83 genes) @ [facility] - No pathogenic mutations detected  . GERD (gastroesophageal reflux disease)   . Hiatal hernia   . History of kidney stones   . History of radiation therapy 07/09/2018- 08/03/18   Left Breast/ 40.05 Gy in 15 fractions. Left Breast boost 10 Gy in 5 fractions.   . Hyperlipemia   . Hypothyroidism   . Insomnia   . Malignant neoplasm of upper-outer quadrant of left female breast ([place])   . Nephrolithiasis   . Pneumonia 2015   aspirated after colonosopy  . Renal cyst     Past Surgical History:  Procedure Laterality Date  . BREAST LUMPECTOMY WITH RADIOACTIVE SEED AND SENTINEL LYMPH NODE BIOPSY Left 04/17/2018   Procedure: LEFT BREAST LUMPECTOMY WITH BRACKETED RADIOACTIVE SEEDS AND SENTINEL LYMPH NODE BIOPSY;  Surgeon: Rolm Bookbinder, MD;  Location:  [place];  Service: General;  Laterality: Left;  . BREAST SURGERY Left    Lumpectomy  . CARDIOVASCULAR STRESS TEST  07/19/2006   EF 70%, NO EVIDENCE OF ISCHEMIA  . CHOLECYSTECTOMY  12/30/10  . COLONOSCOPY    . DILATION AND CURETTAGE OF UTERUS    . HYSTEROSCOPY W/D&C N/A 06/07/2018   Procedure: DILATATION AND CURETTAGE /HYSTEROSCOPY;  Surgeon: [name], MD;  Location: [facility];  Service: Gynecology;  Laterality: N/A;  . inguinal herniography    . NASAL SINUS SURGERY    . RADIOACTIVE SEED GUIDED EXCISIONAL BREAST BIOPSY Right 04/17/2018   Procedure: RIGHT BREAST SEED GUIDED EXCISIONAL BIOPSY;  Surgeon: Rolm Bookbinder, MD;  Location: [place];  Service: General;  Laterality: Right;  . RE-EXCISION OF BREAST LUMPECTOMY Left 05/15/2018   Procedure: RE-EXCISION OF LEFT BREAST LUMPECTOMY, ASPIRATION OF LEFT AXILLARY SEROMA;  Surgeon: Rolm Bookbinder, MD;  Location: [place];  Service: General;  Laterality: Left;  . T & A    . TOOTH EXTRACTION    . TOTAL KNEE ARTHROPLASTY Left 09/11/2017   Procedure: LEFT TOTAL KNEE ARTHROPLASTY;  Surgeon: Gaynelle Arabian, MD;  Location: [facility];  Service: Orthopedics;  Laterality: Left;  with block  . US ECHOCARDIOGRAPHY  07/17/2006   EF 55-60%  There were no vitals filed for this visit.  Subjective Assessment - 11/29/18 1406    Subjective  Pt states she had a busy day yesterday.  She has an appt with her trainer at her fitness center to review the exercise program and teach her the machines that she can work out on in their facility. Her breast is hurting worse than it ever has. She is using her compressin bra and foam pad.  Dr. Vinie Sill said she has a seroma in her breast. She goes to see Dr. Donne Hazel on Dec. 30 and will ask him about aspirating it.     Pertinent History  Patient was diagnosed on 03/08/18 with left grade 1-2 invasive ductal carcinoma breast cancer.It measured 1.5 cm and is located in the upper  outer quadrant. It is ER/PR positive and HER2 negative with a Ki67 of < 1%.  She had a left total knee replacement in 10/18 with Dr. Wynelle Link and continues to have pain in her left lateral knee. Her orthopedist reported it is likely due to her iliotibial band. Patient reports she underwent a left lumpectomy and SNLB (0/4 nodes positive) removed on 04/17/18.. Radiation is now complete     Patient Stated Goals  Reduce breast swelling     Currently in Pain?  Yes    Pain Score  6     Pain Location  Breast    Pain Orientation  Left    Pain Descriptors / Indicators  Sore;Sharp    Pain Onset  More than a month ago    Pain Frequency  Intermittent    Aggravating Factors   pressure on breast                        [facility] Adult PT Treatment/Exercise - 11/29/18 0001      Manual Therapy   Manual Lymphatic Drainage (MLD)  short neck, superficial and deep abdominals. right axially nodes, anterior interaxillary anastamosis, left axilla and axillo inguinal anastamsis, left breast then to sidelying for more work on lateral trunkk   pt reports much soreness in left breast today    Passive ROM  to left shoulder as tolerated                   PT Long Term Goals - 11/27/18 1311      PT LONG TERM GOAL #1   Title  Patient will demonstrate she has returned to baseline related to left shoulder ROM and function including WNL values of flexion and abduction without pulling    Baseline  06/20/18- 143 flexion, 112 abduction, 10/24/2018 140 degrees flexion and 170 degrees of abduction of left shoulder , 21/10/2018  155 degrees of flexion with pulling     Time  4    Period  Weeks    Status  On-going      PT LONG TERM GOAL #2   Title  pt will report a decrease in seroma sensation in the under arm by at least 50%    Baseline  she feels fullness in the breast and axilla that is firm to touch  12/11 2019 decreased, but still fullness at the scar     Time  4    Status  Achieved      PT LONG TERM  GOAL #3   Title  Pt will be independent with self MLD for the Lt lateral breast and seroma region    Status  Achieved  PT LONG TERM GOAL #4   Title  Pt will be set up with how to obtain compression garments    Baseline  to be measured for sleeve on 10/29/2018 and for compression bra on 11/05/2018  11/21/2018. pt does not have sleeve yet     Status  Achieved      PT LONG TERM GOAL #5   Title  Patient wil verbalize understanding of lymphedema risk reduction practices.    Status  Achieved      PT LONG TERM GOAL #6   Title  sit to stand 5 times </= 13 seconds due to improve strength and endurance    Status  Deferred            Plan - 11/29/18 1418    Clinical Impression Statement  Pt is doing well with exercises and should be able to follow through with her trainer at her fitness center.     Rehab Potential  Excellent    Clinical Impairments Affecting Rehab Potential  None    PT Frequency  2x / week    PT Duration  4 weeks    PT Next Visit Plan  continue MLD to Lt lateral breast and seroma region with remedial range of motion exercises to neck upper thoracic spine and trunk. follow up with exercises as needed.        Patient will benefit from skilled therapeutic intervention in order to improve the following deficits and impairments:  Decreased knowledge of precautions, Impaired UE functional use, Decreased range of motion, Postural dysfunction, Decreased scar mobility, Decreased mobility, Pain, Increased fascial restricitons, Increased edema  Visit Diagnosis: Abnormal posture  Localized edema  Stiffness of left shoulder, not elsewhere classified  Pain in left arm  Muscle weakness (generalized)     Problem List Patient Active Problem List   Diagnosis Date Noted  . Genetic testing 04/02/2018  . Family history of breast cancer   . Malignant neoplasm of upper-outer quadrant of left female breast ([place])   . Malignant neoplasm of upper-outer quadrant of left breast  in female, estrogen receptor positive ([place]) 03/16/2018  . OA (osteoarthritis) of knee 09/11/2017  . Infrapatellar bursitis of right knee 04/29/2014  . Pneumonia, aspiration ([place]) 12/28/2012  . HYPOTHYROIDISM 11/15/2010  . HYPERLIPIDEMIA 11/15/2010  . DEPRESSION 11/15/2010  . GERD 11/15/2010  . CONSTIPATION 11/15/2010  . GALLSTONES 11/15/2010  . RENAL CYST 11/15/2010  . INSOMNIA UNSPECIFIED 11/15/2010  . COUGH 11/15/2010  . ABDOMINAL PAIN OTHER SPECIFIED SITE 11/15/2010  . TRANSAMINASES, SERUM, ELEVATED 11/15/2010   Donato Heinz. Owens Shark PT  Norwood Levo 11/29/2018, 2:21 PM  [place], [place], [address] Phone: [phone]   Fax:  [phone]  Name: [name] MRN: [id number] Date of Birth: June 06, 1946

## 2018-12-04 ENCOUNTER — Encounter: Payer: Medicare Other | Admitting: Physical Therapy

## 2018-12-10 DIAGNOSIS — C50412 Malignant neoplasm of upper-outer quadrant of left female breast: Secondary | ICD-10-CM | POA: Diagnosis not present

## 2018-12-17 DIAGNOSIS — R262 Difficulty in walking, not elsewhere classified: Secondary | ICD-10-CM | POA: Diagnosis not present

## 2018-12-17 DIAGNOSIS — G5782 Other specified mononeuropathies of left lower limb: Secondary | ICD-10-CM | POA: Diagnosis not present

## 2018-12-17 DIAGNOSIS — R531 Weakness: Secondary | ICD-10-CM | POA: Diagnosis not present

## 2018-12-17 DIAGNOSIS — M25661 Stiffness of right knee, not elsewhere classified: Secondary | ICD-10-CM | POA: Diagnosis not present

## 2018-12-18 ENCOUNTER — Ambulatory Visit: Payer: Medicare Other | Attending: General Surgery | Admitting: Physical Therapy

## 2018-12-18 ENCOUNTER — Encounter: Payer: Self-pay | Admitting: Physical Therapy

## 2018-12-18 DIAGNOSIS — R293 Abnormal posture: Secondary | ICD-10-CM | POA: Diagnosis not present

## 2018-12-18 DIAGNOSIS — M79602 Pain in left arm: Secondary | ICD-10-CM | POA: Insufficient documentation

## 2018-12-18 DIAGNOSIS — R600 Localized edema: Secondary | ICD-10-CM | POA: Diagnosis not present

## 2018-12-18 DIAGNOSIS — M25612 Stiffness of left shoulder, not elsewhere classified: Secondary | ICD-10-CM

## 2018-12-18 DIAGNOSIS — M6281 Muscle weakness (generalized): Secondary | ICD-10-CM | POA: Diagnosis not present

## 2018-12-18 NOTE — Therapy (Signed)
[place], [place], [address] Phone: [phone]   Fax:  [phone]  Physical Therapy Treatment   Progress Note Reporting Period 10/24/2018  to 12/18/2018  See note below for Objective Data and Assessment of Progress/Goals. Patient Details  Name: [name] MRN: [id number] Date of Birth: 12-18-1945 Referring Provider (PT): Dr. Donne Hazel    Encounter Date: 12/18/2018  PT End of Session - 12/18/18 1631    Visit Number  20    Number of Visits  25    Date for PT Re-Evaluation  12/24/18    Authorization Type  medicare/tricare    PT Start Time  1300    PT Stop Time  1345    PT Time Calculation (min)  45 min    Activity Tolerance  Patient limited by pain    Behavior During Therapy  [facility] for tasks assessed/performed       Past Medical History:  Diagnosis Date  . Arthritis    hands and knees  . Complication of anesthesia    aspiration pna; following a colonoscopy, pt requests head of bed elevated if possible.  . Constipation   . Cough   . Depression   . Dysrhythmia    RBBB on 06-06-17 ekg   . Elevated liver function tests   . Esophageal stricture   . Family history of breast cancer   . Gallstones   . Genetic testing 04/02/2018   STAT Breast panel with reflex to Multi-Cancer panel (83 genes) @ [facility] - No pathogenic mutations detected  . GERD (gastroesophageal reflux disease)   . Hiatal hernia   . History of kidney stones   . History of radiation therapy 07/09/2018- 08/03/18   Left Breast/ 40.05 Gy in 15 fractions. Left Breast boost 10 Gy in 5 fractions.   . Hyperlipemia   . Hypothyroidism   . Insomnia   . Malignant neoplasm of upper-outer quadrant of left female breast ([place])   . Nephrolithiasis   . Pneumonia 2015   aspirated after colonosopy  . Renal cyst     Past Surgical History:  Procedure Laterality Date  . BREAST LUMPECTOMY WITH RADIOACTIVE SEED AND SENTINEL LYMPH NODE BIOPSY  Left 04/17/2018   Procedure: LEFT BREAST LUMPECTOMY WITH BRACKETED RADIOACTIVE SEEDS AND SENTINEL LYMPH NODE BIOPSY;  Surgeon: Rolm Bookbinder, MD;  Location: [place];  Service: General;  Laterality: Left;  . BREAST SURGERY Left    Lumpectomy  . CARDIOVASCULAR STRESS TEST  07/19/2006   EF 70%, NO EVIDENCE OF ISCHEMIA  . CHOLECYSTECTOMY  12/30/10  . COLONOSCOPY    . DILATION AND CURETTAGE OF UTERUS    . HYSTEROSCOPY W/D&C N/A 06/07/2018   Procedure: DILATATION AND CURETTAGE /HYSTEROSCOPY;  Surgeon: [name], MD;  Location: [facility];  Service: Gynecology;  Laterality: N/A;  . inguinal herniography    . NASAL SINUS SURGERY    . RADIOACTIVE SEED GUIDED EXCISIONAL BREAST BIOPSY Right 04/17/2018   Procedure: RIGHT BREAST SEED GUIDED EXCISIONAL BIOPSY;  Surgeon: Rolm Bookbinder, MD;  Location: [place];  Service: General;  Laterality: Right;  . RE-EXCISION OF BREAST LUMPECTOMY Left 05/15/2018   Procedure: RE-EXCISION OF LEFT BREAST LUMPECTOMY, ASPIRATION OF LEFT AXILLARY SEROMA;  Surgeon: Rolm Bookbinder, MD;  Location: [place];  Service: General;  Laterality: Left;  . T & A    . TOOTH EXTRACTION    . TOTAL KNEE ARTHROPLASTY Left 09/11/2017   Procedure: LEFT TOTAL KNEE ARTHROPLASTY;  Surgeon:  Gaynelle Arabian, MD;  Location: [facility];  Service: Orthopedics;  Laterality: Left;  with block  . US ECHOCARDIOGRAPHY  07/17/2006   EF 55-60%    There were no vitals filed for this visit.  Subjective Assessment - 12/18/18 1434    Subjective  Pt states she went to see Dr. Donne Hazel and her confirmed that she has a seroma in her breast, but he did not do and ultrasound or aspiration. He encouraged her to keep doing the manual work on it ever day.  She continues to wear her compression wear and wears the chip pack at time.  She continues to exercise on her own at home and will meet with the trainer at the end of the month. She is having touble with hot  flashes     Pertinent History  Patient was diagnosed on 03/08/18 with left grade 1-2 invasive ductal carcinoma breast cancer.It measured 1.5 cm and is located in the upper outer quadrant. It is ER/PR positive and HER2 negative with a Ki67 of < 1%.  She had a left total knee replacement in 10/18 with Dr. Wynelle Link and continues to have pain in her left lateral knee. Her orthopedist reported it is likely due to her iliotibial band. Patient reports she underwent a left lumpectomy and SNLB (0/4 nodes positive) removed on 04/17/18.. Radiation is now complete     Patient Stated Goals  Reduce breast swelling     Currently in Pain?  No/denies                       [facility] Adult PT Treatment/Exercise - 12/18/18 0001      Exercises   Exercises  Shoulder      Shoulder Exercises: Sidelying   ABduction  AROM;Left;5 reps    ABduction Limitations  pt only able to achieve about 90 degrees of shoulder abduction due to increased pain in chest down to breast     Other Sidelying Exercises  small circles with hand pointed       Manual Therapy   Edema Management  made another chip pack for pt to wear inside bra at top of breast where she has the fullness but does not seem to get contact of compression on the breast     Manual Lymphatic Drainage (MLD)  short neck, superficial and deep abdominals. right axially nodes, anterior interaxillary anastamosis, left axilla and axillo inguinal anastamsis, left breast then to sidelying for more work on lateral trunkk   firmness perceived in left breast and chest at [employer] major                 PT Long Term Goals - 12/18/18 1636      PT LONG TERM GOAL #1   Title  Patient will demonstrate she has returned to baseline related to left shoulder ROM and function including WNL values of flexion and abduction without pulling    Baseline  06/20/18- 143 flexion, 112 abduction, 10/24/2018 140 degrees flexion and 170 degrees of abduction of left shoulder , 21/10/2018   155 degrees of flexion with pulling     Time  4    Period  Weeks    Status  On-going      PT LONG TERM GOAL #2   Title  pt will report a decrease in seroma sensation in the under arm by at least 50%    Baseline  she feels fullness in the breast and axilla that is firm to touch  12/11 2019 decreased, but still fullness at the scar     Status  Achieved      PT LONG TERM GOAL #3   Title  Pt will be independent with self MLD for the Lt lateral breast and seroma region    Status  Achieved      PT LONG TERM GOAL #4   Title  Pt will be set up with how to obtain compression garments    Baseline  to be measured for sleeve on 10/29/2018 and for compression bra on 11/05/2018  11/21/2018. pt does not have sleeve yet     Status  Achieved      PT LONG TERM GOAL #5   Title  Patient wil verbalize understanding of lymphedema risk reduction practices.    Baseline  06/20/18- pt able to independently verbalize this    Status  Achieved            Plan - 12/18/18 1632    Clinical Impression Statement  pt has not been to PT in 2 1/2 weeks and presents with persistent fullness in her left breast.  The fullness at the axilla appears to be better.  She has pain with pressure on this area and decreased ROM with pain with attempts at left shoulder abduction in sidelying. Upgraded chip pack to get compression to top of breast as the fit of the bra does not seem to fit her snugly there and she feels the jovi pak swell spot makes her look too big. she needs to have continued PT for 4 weeks 2 times a week to increase the shoulder abduction range and soften the area at pec major and breast     Rehab Potential  Excellent    Clinical Impairments Affecting Rehab Potential  None    PT Frequency  2x / week    PT Next Visit Plan  work on shoulder abduction in sidelying, scapular strengthening, continue MLD to Lt lateral breast and seroma region with remedial range of motion exercises to neck upper thoracic spine and  trunk. follow up with exercises as needed.     PT Home Exercise Plan  post op shoulder ROM HEP, supine scapular series     Consulted and Agree with Plan of Care  Patient       Patient will benefit from skilled therapeutic intervention in order to improve the following deficits and impairments:  Decreased knowledge of precautions, Impaired UE functional use, Decreased range of motion, Postural dysfunction, Decreased scar mobility, Decreased mobility, Pain, Increased fascial restricitons, Increased edema  Visit Diagnosis: Abnormal posture  Localized edema  Stiffness of left shoulder, not elsewhere classified  Pain in left arm  Muscle weakness (generalized)     Problem List Patient Active Problem List   Diagnosis Date Noted  . Genetic testing 04/02/2018  . Family history of breast cancer   . Malignant neoplasm of upper-outer quadrant of left female breast ([place])   . Malignant neoplasm of upper-outer quadrant of left breast in female, estrogen receptor positive ([place]) 03/16/2018  . OA (osteoarthritis) of knee 09/11/2017  . Infrapatellar bursitis of right knee 04/29/2014  . Pneumonia, aspiration ([place]) 12/28/2012  . HYPOTHYROIDISM 11/15/2010  . HYPERLIPIDEMIA 11/15/2010  . DEPRESSION 11/15/2010  . GERD 11/15/2010  . CONSTIPATION 11/15/2010  . GALLSTONES 11/15/2010  . RENAL CYST 11/15/2010  . INSOMNIA UNSPECIFIED 11/15/2010  . COUGH 11/15/2010  . ABDOMINAL PAIN OTHER SPECIFIED SITE 11/15/2010  . TRANSAMINASES, SERUM, ELEVATED 11/15/2010   Donato Heinz. Owens Shark, PT  [place] Levo 12/18/2018, 4:39 PM  [place], [place], [address] Phone: [phone]   Fax:  [phone]  Name: [name] MRN: [id number] Date of Birth: 1946/11/07

## 2018-12-20 ENCOUNTER — Ambulatory Visit: Payer: Medicare Other | Admitting: Physical Therapy

## 2018-12-20 ENCOUNTER — Encounter: Payer: Self-pay | Admitting: Physical Therapy

## 2018-12-20 DIAGNOSIS — M25612 Stiffness of left shoulder, not elsewhere classified: Secondary | ICD-10-CM | POA: Diagnosis not present

## 2018-12-20 DIAGNOSIS — G5782 Other specified mononeuropathies of left lower limb: Secondary | ICD-10-CM | POA: Diagnosis not present

## 2018-12-20 DIAGNOSIS — M6281 Muscle weakness (generalized): Secondary | ICD-10-CM

## 2018-12-20 DIAGNOSIS — R293 Abnormal posture: Secondary | ICD-10-CM | POA: Diagnosis not present

## 2018-12-20 DIAGNOSIS — R531 Weakness: Secondary | ICD-10-CM | POA: Diagnosis not present

## 2018-12-20 DIAGNOSIS — M79602 Pain in left arm: Secondary | ICD-10-CM | POA: Diagnosis not present

## 2018-12-20 DIAGNOSIS — R262 Difficulty in walking, not elsewhere classified: Secondary | ICD-10-CM | POA: Diagnosis not present

## 2018-12-20 DIAGNOSIS — R600 Localized edema: Secondary | ICD-10-CM

## 2018-12-20 DIAGNOSIS — M25661 Stiffness of right knee, not elsewhere classified: Secondary | ICD-10-CM | POA: Diagnosis not present

## 2018-12-20 NOTE — Therapy (Signed)
[place], [place], [address] Phone: [phone]   Fax:  [phone]  Physical Therapy Treatment  Patient Details  Name: [name] MRN: [id number] Date of Birth: 12/26/1945 Referring Provider (PT): Dr. Donne Hazel    Encounter Date: 12/20/2018  PT End of Session - 12/20/18 1437    Visit Number  21    Number of Visits  25    Date for PT Re-Evaluation  12/24/18    Authorization Type  medicare/tricare    PT Start Time  1300    PT Stop Time  1345    PT Time Calculation (min)  45 min       Past Medical History:  Diagnosis Date  . Arthritis    hands and knees  . Complication of anesthesia    aspiration pna; following a colonoscopy, pt requests head of bed elevated if possible.  . Constipation   . Cough   . Depression   . Dysrhythmia    RBBB on 06-06-17 ekg   . Elevated liver function tests   . Esophageal stricture   . Family history of breast cancer   . Gallstones   . Genetic testing 04/02/2018   STAT Breast panel with reflex to Multi-Cancer panel (83 genes) @ [facility] - No pathogenic mutations detected  . GERD (gastroesophageal reflux disease)   . Hiatal hernia   . History of kidney stones   . History of radiation therapy 07/09/2018- 08/03/18   Left Breast/ 40.05 Gy in 15 fractions. Left Breast boost 10 Gy in 5 fractions.   . Hyperlipemia   . Hypothyroidism   . Insomnia   . Malignant neoplasm of upper-outer quadrant of left female breast ([place])   . Nephrolithiasis   . Pneumonia 2015   aspirated after colonosopy  . Renal cyst     Past Surgical History:  Procedure Laterality Date  . BREAST LUMPECTOMY WITH RADIOACTIVE SEED AND SENTINEL LYMPH NODE BIOPSY Left 04/17/2018   Procedure: LEFT BREAST LUMPECTOMY WITH BRACKETED RADIOACTIVE SEEDS AND SENTINEL LYMPH NODE BIOPSY;  Surgeon: Rolm Bookbinder, MD;  Location: [facility];  Service: General;  Laterality: Left;  . BREAST  SURGERY Left    Lumpectomy  . CARDIOVASCULAR STRESS TEST  07/19/2006   EF 70%, NO EVIDENCE OF ISCHEMIA  . CHOLECYSTECTOMY  12/30/10  . COLONOSCOPY    . DILATION AND CURETTAGE OF UTERUS    . HYSTEROSCOPY W/D&C N/A 06/07/2018   Procedure: DILATATION AND CURETTAGE /HYSTEROSCOPY;  Surgeon: [name], MD;  Location: [facility];  Service: Gynecology;  Laterality: N/A;  . inguinal herniography    . NASAL SINUS SURGERY    . RADIOACTIVE SEED GUIDED EXCISIONAL BREAST BIOPSY Right 04/17/2018   Procedure: RIGHT BREAST SEED GUIDED EXCISIONAL BIOPSY;  Surgeon: Rolm Bookbinder, MD;  Location: [facility];  Service: General;  Laterality: Right;  . RE-EXCISION OF BREAST LUMPECTOMY Left 05/15/2018   Procedure: RE-EXCISION OF LEFT BREAST LUMPECTOMY, ASPIRATION OF LEFT AXILLARY SEROMA;  Surgeon: Rolm Bookbinder, MD;  Location: [facility];  Service: General;  Laterality: Left;  . T & A    . TOOTH EXTRACTION    . TOTAL KNEE ARTHROPLASTY Left 09/11/2017   Procedure: LEFT TOTAL KNEE ARTHROPLASTY;  Surgeon: Gaynelle Arabian, MD;  Location: [facility];  Service: Orthopedics;  Laterality: Left;  with block  . US ECHOCARDIOGRAPHY  07/17/2006   EF 55-60%    There were no vitals filed for this visit.  Subjective Assessment -  12/20/18 1322    Subjective  Pt brings in her sleeve and gauntlet today.  Tried the sleeve on and it is too tight in the upper arm.  Email sent to [facility] to switch out for a bigger size.  Gauntlet is fine.  She has been wearing her chip pack and appears to have less fullness at top of breast, but sill has soreness there    Pertinent History  Patient was diagnosed on 03/08/18 with left grade 1-2 invasive ductal carcinoma breast cancer.It measured 1.5 cm and is located in the upper outer quadrant. It is ER/PR positive and HER2 negative with a Ki67 of < 1%.  She had a left total knee replacement in 10/18 with Dr. Wynelle Link and continues to have pain in her left lateral knee. Her  orthopedist reported it is likely due to her iliotibial band. Patient reports she underwent a left lumpectomy and SNLB (0/4 nodes positive) removed on 04/17/18.. Radiation is now complete     Patient Stated Goals  Reduce breast swelling     Currently in Pain?  Yes    Pain Score  6    5 and 1/2   Pain Location  Chest    Pain Orientation  Left    Pain Descriptors / Indicators  Sore;Sharp    Pain Type  Acute pain    Pain Radiating Towards  to breast and axilla. feels the tightness when she stretches     Pain Onset  More than a month ago    Pain Frequency  Intermittent                       [facility] Adult PT Treatment/Exercise - 12/20/18 0001      Self-Care   Self-Care  Other Self-Care Comments    Other Self-Care Comments   assessed compression sleeve and gauntlet and sent a note to [facility]       Exercises   Exercises  Shoulder      Shoulder Exercises: Supine   Flexion  AROM;Left    ABduction  AROM;Left   pt does better with elbow flexed   Other Supine Exercises  dowel rod flexion x 5 reps       Manual Therapy   Soft tissue mobilization  with thick massage cream and pt in right sidelying, soft tissue work to tight muscles at posterior and infereior scapula , prolonged pressure at tight trigger point at posterior axilla     Manual Lymphatic Drainage (MLD)  briefly to anterior chest, left breast and axilla                   PT Long Term Goals - 12/18/18 1636      PT LONG TERM GOAL #1   Title  Patient will demonstrate she has returned to baseline related to left shoulder ROM and function including WNL values of flexion and abduction without pulling    Baseline  06/20/18- 143 flexion, 112 abduction, 10/24/2018 140 degrees flexion and 170 degrees of abduction of left shoulder , 21/10/2018  155 degrees of flexion with pulling     Time  4    Period  Weeks    Status  On-going      PT LONG TERM GOAL #2   Title  pt will report a decrease in seroma sensation in the  under arm by at least 50%    Baseline  she feels fullness in the breast and axilla that is firm to touch  12/11 2019 decreased, but still fullness at the scar     Status  Achieved      PT LONG TERM GOAL #3   Title  Pt will be independent with self MLD for the Lt lateral breast and seroma region    Status  Achieved      PT LONG TERM GOAL #4   Title  Pt will be set up with how to obtain compression garments    Baseline  to be measured for sleeve on 10/29/2018 and for compression bra on 11/05/2018  11/21/2018. pt does not have sleeve yet     Status  Achieved      PT LONG TERM GOAL #5   Title  Patient wil verbalize understanding of lymphedema risk reduction practices.    Baseline  06/20/18- pt able to independently verbalize this    Status  Achieved            Plan - 12/20/18 1438    Clinical Impression Statement  pt felt much better at end of session.  She had better shoulder ROM with less feelings of tightnes.. She will continue to wear chip pack in her bra as that is helping her lymphedema in breast     Clinical Impairments Affecting Rehab Potential  None    PT Frequency  2x / week    PT Duration  4 weeks    PT Treatment/Interventions  ADLs/Self Care Home Management;Therapeutic activities;Therapeutic exercise;Patient/family education;Manual techniques;Manual lymph drainage;Scar mobilization;Passive range of motion    PT Next Visit Plan  continue on soft tissue work to posterior scapula and axilla work on shoulder abduction in sidelying, scapular strengthening, continue MLD to Lt lateral breast and seroma region with remedial range of motion exercises to neck upper thoracic spine and trunk. follow up with exercises as needed.     Consulted and Agree with Plan of Care  Patient       Patient will benefit from skilled therapeutic intervention in order to improve the following deficits and impairments:  Decreased knowledge of precautions, Impaired UE functional use, Decreased range of  motion, Postural dysfunction, Decreased scar mobility, Decreased mobility, Pain, Increased fascial restricitons, Increased edema  Visit Diagnosis: Abnormal posture  Localized edema  Stiffness of left shoulder, not elsewhere classified  Pain in left arm  Muscle weakness (generalized)     Problem List Patient Active Problem List   Diagnosis Date Noted  . Genetic testing 04/02/2018  . Family history of breast cancer   . Malignant neoplasm of upper-outer quadrant of left female breast ([place])   . Malignant neoplasm of upper-outer quadrant of left breast in female, estrogen receptor positive ([place]) 03/16/2018  . OA (osteoarthritis) of knee 09/11/2017  . Infrapatellar bursitis of right knee 04/29/2014  . Pneumonia, aspiration ([place]) 12/28/2012  . HYPOTHYROIDISM 11/15/2010  . HYPERLIPIDEMIA 11/15/2010  . DEPRESSION 11/15/2010  . GERD 11/15/2010  . CONSTIPATION 11/15/2010  . GALLSTONES 11/15/2010  . RENAL CYST 11/15/2010  . INSOMNIA UNSPECIFIED 11/15/2010  . COUGH 11/15/2010  . ABDOMINAL PAIN OTHER SPECIFIED SITE 11/15/2010  . TRANSAMINASES, SERUM, ELEVATED 11/15/2010   Donato Heinz. Owens Shark PT  Norwood Levo 12/20/2018, 2:40 PM  [place], [place], [address] Phone: [phone]   Fax:  [phone]  Name: [name] MRN: [id number] Date of Birth: [birth year]

## 2018-12-25 ENCOUNTER — Ambulatory Visit: Payer: Medicare Other | Admitting: Physical Therapy

## 2018-12-25 DIAGNOSIS — M6281 Muscle weakness (generalized): Secondary | ICD-10-CM | POA: Diagnosis not present

## 2018-12-25 DIAGNOSIS — M79602 Pain in left arm: Secondary | ICD-10-CM | POA: Diagnosis not present

## 2018-12-25 DIAGNOSIS — M25612 Stiffness of left shoulder, not elsewhere classified: Secondary | ICD-10-CM

## 2018-12-25 DIAGNOSIS — R600 Localized edema: Secondary | ICD-10-CM

## 2018-12-25 DIAGNOSIS — R293 Abnormal posture: Secondary | ICD-10-CM

## 2018-12-25 NOTE — Therapy (Signed)
[place], [place], [address] Phone: [phone]   Fax:  [phone]  Physical Therapy Treatment  Patient Details  Name: [name] MRN: [id number] Date of Birth: [birth year] Referring Provider (PT): Dr. Donne Hazel    Encounter Date: 12/25/2018  PT End of Session - 12/25/18 1447    Visit Number  22    Number of Visits  33    Date for PT Re-Evaluation  01/25/19    PT Start Time  1300    PT Stop Time  1345    PT Time Calculation (min)  45 min    Activity Tolerance  Patient tolerated treatment well    Behavior During Therapy  [facility] for tasks assessed/performed       Past Medical History:  Diagnosis Date  . Arthritis    hands and knees  . Complication of anesthesia    aspiration pna; following a colonoscopy, pt requests head of bed elevated if possible.  . Constipation   . Cough   . Depression   . Dysrhythmia    RBBB on 06-06-17 ekg   . Elevated liver function tests   . Esophageal stricture   . Family history of breast cancer   . Gallstones   . Genetic testing 04/02/2018   STAT Breast panel with reflex to Multi-Cancer panel (83 genes) @ [facility] - No pathogenic mutations detected  . GERD (gastroesophageal reflux disease)   . Hiatal hernia   . History of kidney stones   . History of radiation therapy 07/09/2018- 08/03/18   Left Breast/ 40.05 Gy in 15 fractions. Left Breast boost 10 Gy in 5 fractions.   . Hyperlipemia   . Hypothyroidism   . Insomnia   . Malignant neoplasm of upper-outer quadrant of left female breast ([place])   . Nephrolithiasis   . Pneumonia 2015   aspirated after colonosopy  . Renal cyst     Past Surgical History:  Procedure Laterality Date  . BREAST LUMPECTOMY WITH RADIOACTIVE SEED AND SENTINEL LYMPH NODE BIOPSY Left 04/17/2018   Procedure: LEFT BREAST LUMPECTOMY WITH BRACKETED RADIOACTIVE SEEDS AND SENTINEL LYMPH NODE BIOPSY;  Surgeon: Rolm Bookbinder, MD;  Location:  [facility];  Service: General;  Laterality: Left;  . BREAST SURGERY Left    Lumpectomy  . CARDIOVASCULAR STRESS TEST  07/19/2006   EF 70%, NO EVIDENCE OF ISCHEMIA  . CHOLECYSTECTOMY  12/30/10  . COLONOSCOPY    . DILATION AND CURETTAGE OF UTERUS    . HYSTEROSCOPY W/D&C N/A 06/07/2018   Procedure: DILATATION AND CURETTAGE /HYSTEROSCOPY;  Surgeon: [name], MD;  Location: [facility];  Service: Gynecology;  Laterality: N/A;  . inguinal herniography    . NASAL SINUS SURGERY    . RADIOACTIVE SEED GUIDED EXCISIONAL BREAST BIOPSY Right 04/17/2018   Procedure: RIGHT BREAST SEED GUIDED EXCISIONAL BIOPSY;  Surgeon: Rolm Bookbinder, MD;  Location: [facility];  Service: General;  Laterality: Right;  . RE-EXCISION OF BREAST LUMPECTOMY Left 05/15/2018   Procedure: RE-EXCISION OF LEFT BREAST LUMPECTOMY, ASPIRATION OF LEFT AXILLARY SEROMA;  Surgeon: Rolm Bookbinder, MD;  Location: [facility];  Service: General;  Laterality: Left;  . T & A    . TOOTH EXTRACTION    . TOTAL KNEE ARTHROPLASTY Left 09/11/2017   Procedure: LEFT TOTAL KNEE ARTHROPLASTY;  Surgeon: Gaynelle Arabian, MD;  Location: [facility];  Service: Orthopedics;  Laterality: Left;  with block  . US ECHOCARDIOGRAPHY  07/17/2006   EF 55-60%  There were no vitals filed for this visit.  Subjective Assessment - 12/25/18 1308    Subjective  Pt has been wearing her compression bra and chip pack. While firm area feels softer at time, it is still there and she feels pulling down into breast and chest with elevating her arm.  She has tight tender trigger points in posterior shoulder and scapula that feel better with soft tissue work     Pertinent History  Patient was diagnosed on 03/08/18 with left grade 1-2 invasive ductal carcinoma breast cancer.It measured 1.5 cm and is located in the upper outer quadrant. It is ER/PR positive and HER2 negative with a Ki67 of < 1%.  She had a left total knee replacement in 10/18  with Dr. Wynelle Link and continues to have pain in her left lateral knee. Her orthopedist reported it is likely due to her iliotibial band. Patient reports she underwent a left lumpectomy and SNLB (0/4 nodes positive) removed on 04/17/18.. Radiation is now complete     Currently in Pain?  Yes    Pain Score  5     Pain Location  Breast   hurt just sitting here    Pain Orientation  Left    Pain Descriptors / Indicators  --   stinging         [facility] PT Assessment - 12/25/18 0001      Assessment   Medical Diagnosis  s/p left lumpectomy and SLNB    Referring Provider (PT)  Dr. Donne Hazel     Onset Date/Surgical Date  04/17/18      Prior Function   Level of Independence  Independent      AROM   Left Shoulder Flexion  145 Degrees    Left Shoulder ABduction  160 Degrees   visibile pulling in axilla and pt feels pulling into chest                  [facility] Adult PT Treatment/Exercise - 12/25/18 0001      Self-Care   Self-Care  Other Self-Care Comments    Other Self-Care Comments   showed pt pictues of lymphapress pump comfylite with axillary component, night garments and andrea compression short with axillary pads. Pt concerned about the increase in her hot flashes causing her not to tolerate wearing these garments  I will call lympha press and see if they have a trial period for pump       Manual Therapy   Soft tissue mobilization  with thick massage cream and pt in right sidelying, soft tissue work to tight muscles at posterior and infereior scapula , prolonged pressure at tight trigger point at posterior axilla     Manual Lymphatic Drainage (MLD)  briefly to anterior chest, left breast and axilla                   PT Long Term Goals - 12/25/18 1310      PT LONG TERM GOAL #1   Title  Patient will demonstrate she has returned to baseline related to left shoulder ROM and function including WNL values of flexion and abduction without pulling    Baseline  06/20/18- 143  flexion, 112 abduction, 10/24/2018 140 degrees flexion and 170 degrees of abduction of left shoulder , 11/21/2018  155 degrees of flexion with pulling  12/25/2018/ 145 flexion and 160 degrees abduciton but still with pulling and pain     Time  4    Period  Weeks  Status  On-going      PT LONG TERM GOAL #2   Title  pt will report a decrease in seroma sensation in the under arm by at least 50%    Baseline  she feels fullness in the breast and axilla that is firm to touch  12/11 2019 decreased, but still fullness at the scar  12/25/2018, seroma has decreased since she started but she still has pain every day when she moves her arm     Time  4    Period  Weeks    Status  On-going      PT LONG TERM GOAL #3   Title  Pt will be independent with self MLD for the Lt lateral breast and seroma region    Status  Achieved      PT LONG TERM GOAL #4   Title  Pt will be set up with how to obtain compression garments    Status  Achieved      PT LONG TERM GOAL #5   Title  Patient wil verbalize understanding of lymphedema risk reduction practices.    Status  Achieved            Plan - 12/25/18 1448    Clinical Impression Statement  Pt continues to have pain and tightness in anterior chest and breast with visible pulling into axilla with elevation of arm and tight, tender trigger points in posterior scupluar muscles.  Tightness persists despite wearing a compresison bra and doing exercise at home.  We discussed possible ocmpresison pump and nighttime garments today and sent renewal for 4 more weeks of PT to try to get to the point where pt feels she can manage the discomfort at home     Rehab Potential  Excellent    Clinical Impairments Affecting Rehab Potential  None    PT Frequency  2x / week    PT Duration  4 weeks    PT Next Visit Plan  continue on soft tissue work to posterior scapula and axilla work on shoulder abduction in sidelying, scapular strengthening, continue MLD to Lt lateral breast  and seroma region with remedial range of motion exercises to neck upper thoracic spine and trunk. follow up with exercises as needed.  continue to explore compression pump, nighttime and Andrea garments      PT Home Exercise Plan  post op shoulder ROM HEP, supine scapular series        Patient will benefit from skilled therapeutic intervention in order to improve the following deficits and impairments:  Decreased knowledge of precautions, Impaired UE functional use, Decreased range of motion, Postural dysfunction, Decreased scar mobility, Decreased mobility, Pain, Increased fascial restricitons, Increased edema  Visit Diagnosis: Abnormal posture - Plan: PT plan of care cert/re-cert  Localized edema - Plan: PT plan of care cert/re-cert  Stiffness of left shoulder, not elsewhere classified - Plan: PT plan of care cert/re-cert  Pain in left arm - Plan: PT plan of care cert/re-cert  Muscle weakness (generalized) - Plan: PT plan of care cert/re-cert     Problem List Patient Active Problem List   Diagnosis Date Noted  . Genetic testing 04/02/2018  . Family history of breast cancer   . Malignant neoplasm of upper-outer quadrant of left female breast ([place])   . Malignant neoplasm of upper-outer quadrant of left breast in female, estrogen receptor positive ([place]) 03/16/2018  . OA (osteoarthritis) of knee 09/11/2017  . Infrapatellar bursitis of right knee 04/29/2014  . Pneumonia, aspiration (  [place]) 12/28/2012  . HYPOTHYROIDISM 11/15/2010  . HYPERLIPIDEMIA 11/15/2010  . DEPRESSION 11/15/2010  . GERD 11/15/2010  . CONSTIPATION 11/15/2010  . GALLSTONES 11/15/2010  . RENAL CYST 11/15/2010  . INSOMNIA UNSPECIFIED 11/15/2010  . COUGH 11/15/2010  . ABDOMINAL PAIN OTHER SPECIFIED SITE 11/15/2010  . TRANSAMINASES, SERUM, ELEVATED 11/15/2010   Donato Heinz. Owens Shark PT  Norwood Levo 12/25/2018, 2:54 PM  [place], [place], [address] Phone: [phone]   Fax:  [phone]  Name: [name] MRN: [id number] Date of Birth: July 02, 1946

## 2018-12-26 ENCOUNTER — Ambulatory Visit: Payer: Medicare Other | Admitting: Physical Therapy

## 2018-12-27 ENCOUNTER — Ambulatory Visit: Payer: Medicare Other | Admitting: Physical Therapy

## 2018-12-27 ENCOUNTER — Encounter: Payer: Self-pay | Admitting: Physical Therapy

## 2018-12-27 DIAGNOSIS — R600 Localized edema: Secondary | ICD-10-CM | POA: Diagnosis not present

## 2018-12-27 DIAGNOSIS — F325 Major depressive disorder, single episode, in full remission: Secondary | ICD-10-CM | POA: Diagnosis not present

## 2018-12-27 DIAGNOSIS — M25612 Stiffness of left shoulder, not elsewhere classified: Secondary | ICD-10-CM

## 2018-12-27 DIAGNOSIS — M6281 Muscle weakness (generalized): Secondary | ICD-10-CM | POA: Diagnosis not present

## 2018-12-27 DIAGNOSIS — M859 Disorder of bone density and structure, unspecified: Secondary | ICD-10-CM | POA: Diagnosis not present

## 2018-12-27 DIAGNOSIS — R293 Abnormal posture: Secondary | ICD-10-CM | POA: Diagnosis not present

## 2018-12-27 DIAGNOSIS — E663 Overweight: Secondary | ICD-10-CM | POA: Diagnosis not present

## 2018-12-27 DIAGNOSIS — C50412 Malignant neoplasm of upper-outer quadrant of left female breast: Secondary | ICD-10-CM | POA: Diagnosis not present

## 2018-12-27 DIAGNOSIS — Z1389 Encounter for screening for other disorder: Secondary | ICD-10-CM | POA: Diagnosis not present

## 2018-12-27 DIAGNOSIS — K760 Fatty (change of) liver, not elsewhere classified: Secondary | ICD-10-CM | POA: Diagnosis not present

## 2018-12-27 DIAGNOSIS — R945 Abnormal results of liver function studies: Secondary | ICD-10-CM | POA: Diagnosis not present

## 2018-12-27 DIAGNOSIS — M79602 Pain in left arm: Secondary | ICD-10-CM

## 2018-12-27 DIAGNOSIS — Z6824 Body mass index (BMI) 24.0-24.9, adult: Secondary | ICD-10-CM | POA: Diagnosis not present

## 2018-12-27 DIAGNOSIS — E038 Other specified hypothyroidism: Secondary | ICD-10-CM | POA: Diagnosis not present

## 2018-12-27 DIAGNOSIS — I700 Atherosclerosis of aorta: Secondary | ICD-10-CM | POA: Diagnosis not present

## 2018-12-27 NOTE — Therapy (Signed)
[place], [place], [address] Phone: [phone]   Fax:  [phone]  Physical Therapy Treatment  Patient Details  Name: [name] MRN: [id number] Date of Birth: 25-May-1946 Referring Provider (PT): Dr. Donne Hazel    Encounter Date: 12/27/2018  PT End of Session - 12/27/18 1653    Visit Number  23    Number of Visits  33    Date for PT Re-Evaluation  01/25/19    Authorization Type  medicare/tricare    PT Start Time  1515    PT Stop Time  1600    PT Time Calculation (min)  45 min    Activity Tolerance  Patient tolerated treatment well    Behavior During Therapy  [facility] for tasks assessed/performed       Past Medical History:  Diagnosis Date  . Arthritis    hands and knees  . Complication of anesthesia    aspiration pna; following a colonoscopy, pt requests head of bed elevated if possible.  . Constipation   . Cough   . Depression   . Dysrhythmia    RBBB on 06-06-17 ekg   . Elevated liver function tests   . Esophageal stricture   . Family history of breast cancer   . Gallstones   . Genetic testing 04/02/2018   STAT Breast panel with reflex to Multi-Cancer panel (83 genes) @ [facility] - No pathogenic mutations detected  . GERD (gastroesophageal reflux disease)   . Hiatal hernia   . History of kidney stones   . History of radiation therapy 07/09/2018- 08/03/18   Left Breast/ 40.05 Gy in 15 fractions. Left Breast boost 10 Gy in 5 fractions.   . Hyperlipemia   . Hypothyroidism   . Insomnia   . Malignant neoplasm of upper-outer quadrant of left female breast ([place])   . Nephrolithiasis   . Pneumonia 2015   aspirated after colonosopy  . Renal cyst     Past Surgical History:  Procedure Laterality Date  . BREAST LUMPECTOMY WITH RADIOACTIVE SEED AND SENTINEL LYMPH NODE BIOPSY Left 04/17/2018   Procedure: LEFT BREAST LUMPECTOMY WITH BRACKETED RADIOACTIVE SEEDS AND SENTINEL LYMPH NODE BIOPSY;   Surgeon: Rolm Bookbinder, MD;  Location: [facility];  Service: General;  Laterality: Left;  . BREAST SURGERY Left    Lumpectomy  . CARDIOVASCULAR STRESS TEST  07/19/2006   EF 70%, NO EVIDENCE OF ISCHEMIA  . CHOLECYSTECTOMY  12/30/10  . COLONOSCOPY    . DILATION AND CURETTAGE OF UTERUS    . HYSTEROSCOPY W/D&C N/A 06/07/2018   Procedure: DILATATION AND CURETTAGE /HYSTEROSCOPY;  Surgeon: [name], MD;  Location: [facility];  Service: Gynecology;  Laterality: N/A;  . inguinal herniography    . NASAL SINUS SURGERY    . RADIOACTIVE SEED GUIDED EXCISIONAL BREAST BIOPSY Right 04/17/2018   Procedure: RIGHT BREAST SEED GUIDED EXCISIONAL BIOPSY;  Surgeon: Rolm Bookbinder, MD;  Location: [facility];  Service: General;  Laterality: Right;  . RE-EXCISION OF BREAST LUMPECTOMY Left 05/15/2018   Procedure: RE-EXCISION OF LEFT BREAST LUMPECTOMY, ASPIRATION OF LEFT AXILLARY SEROMA;  Surgeon: Rolm Bookbinder, MD;  Location: [facility];  Service: General;  Laterality: Left;  . T & A    . TOOTH EXTRACTION    . TOTAL KNEE ARTHROPLASTY Left 09/11/2017   Procedure: LEFT TOTAL KNEE ARTHROPLASTY;  Surgeon: Gaynelle Arabian, MD;  Location: [facility];  Service: Orthopedics;  Laterality: Left;  with block  . US ECHOCARDIOGRAPHY  07/17/2006   EF 55-60%    There were no vitals filed for this visit.  Subjective Assessment - 12/27/18 1527    Subjective  pt went to her GP today and had a good visit.  Got a reponse back from [facility] and they do not have rental program Pt decided not to move forward with it because they look like they will be too hot   (Pended)     Pertinent History  Patient was diagnosed on 03/08/18 with left grade 1-2 invasive ductal carcinoma breast cancer.It measured 1.5 cm and is located in the upper outer quadrant. It is ER/PR positive and HER2 negative with a Ki67 of < 1%.  She had a left total knee replacement in 10/18 with Dr. Wynelle Link and continues to  have pain in her left lateral knee. Her orthopedist reported it is likely due to her iliotibial band. Patient reports she underwent a left lumpectomy and SNLB (0/4 nodes positive) removed on 04/17/18.. Radiation is now complete   (Pended)                        [facility] Adult PT Treatment/Exercise - 12/27/18 0001      Manual Therapy   Edema Management  encouraged pt to wear swell spot in compression bra at sore spot     Myofascial Release  to anterior chest and posterior sholder     Manual Lymphatic Drainage (MLD)  short neck, superficial and deep abdominals. right axially nodes, anterior interaxillary anastamosis, left axilla and axillo inguinal anastamsis, left breast then to sidelying for more work on lateral trunkk   firmness perceived in left breast and chest at [employer] major                 PT Long Term Goals - 12/25/18 1310      PT LONG TERM GOAL #1   Title  Patient will demonstrate she has returned to baseline related to left shoulder ROM and function including WNL values of flexion and abduction without pulling    Baseline  06/20/18- 143 flexion, 112 abduction, 10/24/2018 140 degrees flexion and 170 degrees of abduction of left shoulder , 11/21/2018  155 degrees of flexion with pulling  12/25/2018/ 145 flexion and 160 degrees abduciton but still with pulling and pain     Time  4    Period  Weeks    Status  On-going      PT LONG TERM GOAL #2   Title  pt will report a decrease in seroma sensation in the under arm by at least 50%    Baseline  she feels fullness in the breast and axilla that is firm to touch  12/11 2019 decreased, but still fullness at the scar  12/25/2018, seroma has decreased since she started but she still has pain every day when she moves her arm     Time  4    Period  Weeks    Status  On-going      PT LONG TERM GOAL #3   Title  Pt will be independent with self MLD for the Lt lateral breast and seroma region    Status  Achieved      PT LONG  TERM GOAL #4   Title  Pt will be set up with how to obtain compression garments    Status  Achieved      PT LONG TERM GOAL #5   Title  Patient wil verbalize understanding of lymphedema  risk reduction practices.    Status  Achieved            Plan - 12/27/18 1654    Clinical Impression Statement  Pt continues with pain and tightness especailly when trying to abduct her shoulder when lying on her left side.  extra time on myofascial release today with limited result. encouaged pt to wear her swell spot    PT Frequency  2x / week    PT Duration  4 weeks    PT Next Visit Plan  Try kinesiotape on axilla and possibly firm area on breast Continue on soft tissue work to posterior scapula and axilla work on shoulder abduction in sidelying, scapular strengthening, continue MLD to Lt lateral breast and seroma region with remedial range of motion exercises to neck upper thoracic spine and trunk. follow up with exercises as needed.  continue to explore compression pump, nighttime and Andrea garments         Patient will benefit from skilled therapeutic intervention in order to improve the following deficits and impairments:  Decreased knowledge of precautions, Impaired UE functional use, Decreased range of motion, Postural dysfunction, Decreased scar mobility, Decreased mobility, Pain, Increased fascial restricitons, Increased edema  Visit Diagnosis: Abnormal posture  Localized edema  Stiffness of left shoulder, not elsewhere classified  Pain in left arm  Muscle weakness (generalized)     Problem List Patient Active Problem List   Diagnosis Date Noted  . Genetic testing 04/02/2018  . Family history of breast cancer   . Malignant neoplasm of upper-outer quadrant of left female breast ([place])   . Malignant neoplasm of upper-outer quadrant of left breast in female, estrogen receptor positive ([place]) 03/16/2018  . OA (osteoarthritis) of knee 09/11/2017  . Infrapatellar bursitis of right  knee 04/29/2014  . Pneumonia, aspiration ([place]) 12/28/2012  . HYPOTHYROIDISM 11/15/2010  . HYPERLIPIDEMIA 11/15/2010  . DEPRESSION 11/15/2010  . GERD 11/15/2010  . CONSTIPATION 11/15/2010  . GALLSTONES 11/15/2010  . RENAL CYST 11/15/2010  . INSOMNIA UNSPECIFIED 11/15/2010  . COUGH 11/15/2010  . ABDOMINAL PAIN OTHER SPECIFIED SITE 11/15/2010  . TRANSAMINASES, SERUM, ELEVATED 11/15/2010   Donato Heinz. Owens Shark PT  [place] Levo 12/27/2018, 4:56 PM  [place], [place], [address] Phone: [phone]   Fax:  [phone]  Name: PATTON SWISHER MRN: [id number] Date of Birth: 31-Aug-1946

## 2018-12-28 DIAGNOSIS — R262 Difficulty in walking, not elsewhere classified: Secondary | ICD-10-CM | POA: Diagnosis not present

## 2018-12-28 DIAGNOSIS — M25661 Stiffness of right knee, not elsewhere classified: Secondary | ICD-10-CM | POA: Diagnosis not present

## 2018-12-28 DIAGNOSIS — G5782 Other specified mononeuropathies of left lower limb: Secondary | ICD-10-CM | POA: Diagnosis not present

## 2018-12-28 DIAGNOSIS — R531 Weakness: Secondary | ICD-10-CM | POA: Diagnosis not present

## 2019-01-01 ENCOUNTER — Ambulatory Visit: Payer: Medicare Other | Admitting: Physical Therapy

## 2019-01-01 ENCOUNTER — Encounter: Payer: Self-pay | Admitting: Physical Therapy

## 2019-01-01 DIAGNOSIS — R600 Localized edema: Secondary | ICD-10-CM

## 2019-01-01 DIAGNOSIS — M79602 Pain in left arm: Secondary | ICD-10-CM | POA: Diagnosis not present

## 2019-01-01 DIAGNOSIS — M6281 Muscle weakness (generalized): Secondary | ICD-10-CM | POA: Diagnosis not present

## 2019-01-01 DIAGNOSIS — R293 Abnormal posture: Secondary | ICD-10-CM | POA: Diagnosis not present

## 2019-01-01 DIAGNOSIS — M25612 Stiffness of left shoulder, not elsewhere classified: Secondary | ICD-10-CM

## 2019-01-01 NOTE — Therapy (Signed)
[place], [place], [address] Phone: [phone]   Fax:  [phone]  Physical Therapy Treatment  Patient Details  Name: [name] MRN: [id number] Date of Birth: [birth year] Referring Provider (PT): Dr. Donne Hazel    Encounter Date: 01/01/2019  PT End of Session - 01/01/19 1614    Visit Number  24    Number of Visits  33    Date for PT Re-Evaluation  01/25/19    Authorization Type  medicare/tricare    PT Start Time  1435    PT Stop Time  1520    PT Time Calculation (min)  45 min    Activity Tolerance  Patient tolerated treatment well    Behavior During Therapy  [facility] for tasks assessed/performed       Past Medical History:  Diagnosis Date  . Arthritis    hands and knees  . Complication of anesthesia    aspiration pna; following a colonoscopy, pt requests head of bed elevated if possible.  . Constipation   . Cough   . Depression   . Dysrhythmia    RBBB on 06-06-17 ekg   . Elevated liver function tests   . Esophageal stricture   . Family history of breast cancer   . Gallstones   . Genetic testing 04/02/2018   STAT Breast panel with reflex to Multi-Cancer panel (83 genes) @ [facility] - No pathogenic mutations detected  . GERD (gastroesophageal reflux disease)   . Hiatal hernia   . History of kidney stones   . History of radiation therapy 07/09/2018- 08/03/18   Left Breast/ 40.05 Gy in 15 fractions. Left Breast boost 10 Gy in 5 fractions.   . Hyperlipemia   . Hypothyroidism   . Insomnia   . Malignant neoplasm of upper-outer quadrant of left female breast ([place])   . Nephrolithiasis   . Pneumonia 2015   aspirated after colonosopy  . Renal cyst     Past Surgical History:  Procedure Laterality Date  . BREAST LUMPECTOMY WITH RADIOACTIVE SEED AND SENTINEL LYMPH NODE BIOPSY Left 04/17/2018   Procedure: LEFT BREAST LUMPECTOMY WITH BRACKETED RADIOACTIVE SEEDS AND SENTINEL LYMPH NODE BIOPSY;   Surgeon: Rolm Bookbinder, MD;  Location: [facility];  Service: General;  Laterality: Left;  . BREAST SURGERY Left    Lumpectomy  . CARDIOVASCULAR STRESS TEST  07/19/2006   EF 70%, NO EVIDENCE OF ISCHEMIA  . CHOLECYSTECTOMY  12/30/10  . COLONOSCOPY    . DILATION AND CURETTAGE OF UTERUS    . HYSTEROSCOPY W/D&C N/A 06/07/2018   Procedure: DILATATION AND CURETTAGE /HYSTEROSCOPY;  Surgeon: [name], MD;  Location: [facility];  Service: Gynecology;  Laterality: N/A;  . inguinal herniography    . NASAL SINUS SURGERY    . RADIOACTIVE SEED GUIDED EXCISIONAL BREAST BIOPSY Right 04/17/2018   Procedure: RIGHT BREAST SEED GUIDED EXCISIONAL BIOPSY;  Surgeon: Rolm Bookbinder, MD;  Location: [facility];  Service: General;  Laterality: Right;  . RE-EXCISION OF BREAST LUMPECTOMY Left 05/15/2018   Procedure: RE-EXCISION OF LEFT BREAST LUMPECTOMY, ASPIRATION OF LEFT AXILLARY SEROMA;  Surgeon: Rolm Bookbinder, MD;  Location: [facility];  Service: General;  Laterality: Left;  . T & A    . TOOTH EXTRACTION    . TOTAL KNEE ARTHROPLASTY Left 09/11/2017   Procedure: LEFT TOTAL KNEE ARTHROPLASTY;  Surgeon: Gaynelle Arabian, MD;  Location: [facility];  Service: Orthopedics;  Laterality: Left;  with block  . US ECHOCARDIOGRAPHY  07/17/2006   EF 55-60%    There were no vitals filed for this visit.  Subjective Assessment - 01/01/19 1445    Subjective  pt is still going to the fitness center because some of it is too much for her knee  She is still doing the postural exercises but feels that she is not seeing much difference     Pertinent History  Patient was diagnosed on 03/08/18 with left grade 1-2 invasive ductal carcinoma breast cancer.It measured 1.5 cm and is located in the upper outer quadrant. It is ER/PR positive and HER2 negative with a Ki67 of < 1%.  She had a left total knee replacement in 10/18 with Dr. Wynelle Link and continues to have pain in her left lateral knee. Her  orthopedist reported it is likely due to her iliotibial band. Patient reports she underwent a left lumpectomy and SNLB (0/4 nodes positive) removed on 04/17/18.. Radiation is now complete     Patient Stated Goals  Reduce breast swelling     Currently in Pain?  No/denies   none at rest, but she does have pain when lying on her side    Pain Score  4     Pain Location  Breast    Pain Orientation  Left    Pain Descriptors / Indicators  Aching;Burning    Pain Type  Chronic pain                       [facility] Adult PT Treatment/Exercise - 01/01/19 0001      Manual Therapy   Manual Therapy  Manual Lymphatic Drainage (MLD);Taping    Soft tissue mobilization  with thick massage cream and pt in right sidelying, soft tissue work to tight muscles at posterior and infereior scapula , prolonged pressure at tight trigger point at posterior axilla     Kinesiotex  Edema      Kinesiotix   Edema  4 pronged kinesiotpe from lateral breast /axilla to back                   PT Long Term Goals - 12/25/18 1310      PT LONG TERM GOAL #1   Title  Patient will demonstrate she has returned to baseline related to left shoulder ROM and function including WNL values of flexion and abduction without pulling    Baseline  06/20/18- 143 flexion, 112 abduction, 10/24/2018 140 degrees flexion and 170 degrees of abduction of left shoulder , 11/21/2018  155 degrees of flexion with pulling  12/25/2018/ 145 flexion and 160 degrees abduciton but still with pulling and pain     Time  4    Period  Weeks    Status  On-going      PT LONG TERM GOAL #2   Title  pt will report a decrease in seroma sensation in the under arm by at least 50%    Baseline  she feels fullness in the breast and axilla that is firm to touch  12/11 2019 decreased, but still fullness at the scar  12/25/2018, seroma has decreased since she started but she still has pain every day when she moves her arm     Time  4    Period  Weeks     Status  On-going      PT LONG TERM GOAL #3   Title  Pt will be independent with self MLD for the Lt lateral breast and seroma region  Status  Achieved      PT LONG TERM GOAL #4   Title  Pt will be set up with how to obtain compression garments    Status  Achieved      PT LONG TERM GOAL #5   Title  Patient wil verbalize understanding of lymphedema risk reduction practices.    Status  Achieved            Plan - 01/01/19 1614    Clinical Impression Statement  Pt reports she is having a good day today with less tenderness at breast and axilla.  She stilll has tender trigger points at posterior scpulae and axilla with firm area near axillary scar.  Tried kinesiotape to this area today     Rehab Potential  Excellent    Clinical Impairments Affecting Rehab Potential  None    PT Duration  4 weeks    PT Treatment/Interventions  ADLs/Self Care Home Management;Therapeutic activities;Therapeutic exercise;Patient/family education;Manual techniques;Manual lymph drainage;Scar mobilization;Passive range of motion    PT Next Visit Plan  Assess effect of kinesiotape on axilla and possibly firm area on breast Continue on soft tissue work to posterior scapula and axilla work on shoulder abduction in sidelying, scapular strengthening, continue MLD to Lt lateral breast and seroma region with remedial range of motion exercises to neck upper thoracic spine and trunk. follow up with exercises as needed.  continue to explore compression pump, nighttime and Andrea garments         Patient will benefit from skilled therapeutic intervention in order to improve the following deficits and impairments:  Decreased knowledge of precautions, Impaired UE functional use, Decreased range of motion, Postural dysfunction, Decreased scar mobility, Decreased mobility, Pain, Increased fascial restricitons, Increased edema  Visit Diagnosis: Abnormal posture  Localized edema  Stiffness of left shoulder, not elsewhere  classified  Pain in left arm  Muscle weakness (generalized)     Problem List Patient Active Problem List   Diagnosis Date Noted  . Genetic testing 04/02/2018  . Family history of breast cancer   . Malignant neoplasm of upper-outer quadrant of left female breast ([place])   . Malignant neoplasm of upper-outer quadrant of left breast in female, estrogen receptor positive ([place]) 03/16/2018  . OA (osteoarthritis) of knee 09/11/2017  . Infrapatellar bursitis of right knee 04/29/2014  . Pneumonia, aspiration ([place]) 12/28/2012  . HYPOTHYROIDISM 11/15/2010  . HYPERLIPIDEMIA 11/15/2010  . DEPRESSION 11/15/2010  . GERD 11/15/2010  . CONSTIPATION 11/15/2010  . GALLSTONES 11/15/2010  . RENAL CYST 11/15/2010  . INSOMNIA UNSPECIFIED 11/15/2010  . COUGH 11/15/2010  . ABDOMINAL PAIN OTHER SPECIFIED SITE 11/15/2010  . TRANSAMINASES, SERUM, ELEVATED 11/15/2010   Donato Heinz. Owens Shark PT  [place] Levo 01/01/2019, 4:17 PM  [place], [place], [address] Phone: [phone]   Fax:  [phone]  Name: JAZMEN LINDENBAUM MRN: [id number] Date of Birth: Mar 29, 1946

## 2019-01-02 DIAGNOSIS — M25661 Stiffness of right knee, not elsewhere classified: Secondary | ICD-10-CM | POA: Diagnosis not present

## 2019-01-02 DIAGNOSIS — R531 Weakness: Secondary | ICD-10-CM | POA: Diagnosis not present

## 2019-01-02 DIAGNOSIS — R262 Difficulty in walking, not elsewhere classified: Secondary | ICD-10-CM | POA: Diagnosis not present

## 2019-01-02 DIAGNOSIS — G5782 Other specified mononeuropathies of left lower limb: Secondary | ICD-10-CM | POA: Diagnosis not present

## 2019-01-03 ENCOUNTER — Encounter: Payer: Self-pay | Admitting: Physical Therapy

## 2019-01-03 ENCOUNTER — Ambulatory Visit: Payer: Medicare Other | Admitting: Physical Therapy

## 2019-01-03 DIAGNOSIS — M6281 Muscle weakness (generalized): Secondary | ICD-10-CM | POA: Diagnosis not present

## 2019-01-03 DIAGNOSIS — M79602 Pain in left arm: Secondary | ICD-10-CM

## 2019-01-03 DIAGNOSIS — M25612 Stiffness of left shoulder, not elsewhere classified: Secondary | ICD-10-CM

## 2019-01-03 DIAGNOSIS — R600 Localized edema: Secondary | ICD-10-CM | POA: Diagnosis not present

## 2019-01-03 DIAGNOSIS — R293 Abnormal posture: Secondary | ICD-10-CM

## 2019-01-03 NOTE — Therapy (Signed)
[place], [place], [address] Phone: [phone]   Fax:  [phone]  Physical Therapy Treatment  Patient Details  Name: [name] MRN: [id number] Date of Birth: June 01, 1946 Referring Provider (PT): Dr. Donne Hazel    Encounter Date: 01/03/2019  PT End of Session - 01/03/19 1706    Visit Number  25    Number of Visits  33    Date for PT Re-Evaluation  01/25/19    Authorization Type  medicare/tricare    PT Start Time  1430    PT Stop Time  1515    PT Time Calculation (min)  45 min    Activity Tolerance  Patient tolerated treatment well    Behavior During Therapy  [facility] for tasks assessed/performed       Past Medical History:  Diagnosis Date  . Arthritis    hands and knees  . Complication of anesthesia    aspiration pna; following a colonoscopy, pt requests head of bed elevated if possible.  . Constipation   . Cough   . Depression   . Dysrhythmia    RBBB on 06-06-17 ekg   . Elevated liver function tests   . Esophageal stricture   . Family history of breast cancer   . Gallstones   . Genetic testing 04/02/2018   STAT Breast panel with reflex to Multi-Cancer panel (83 genes) @ [facility] - No pathogenic mutations detected  . GERD (gastroesophageal reflux disease)   . Hiatal hernia   . History of kidney stones   . History of radiation therapy 07/09/2018- 08/03/18   Left Breast/ 40.05 Gy in 15 fractions. Left Breast boost 10 Gy in 5 fractions.   . Hyperlipemia   . Hypothyroidism   . Insomnia   . Malignant neoplasm of upper-outer quadrant of left female breast ([place])   . Nephrolithiasis   . Pneumonia 2015   aspirated after colonosopy  . Renal cyst     Past Surgical History:  Procedure Laterality Date  . BREAST LUMPECTOMY WITH RADIOACTIVE SEED AND SENTINEL LYMPH NODE BIOPSY Left 04/17/2018   Procedure: LEFT BREAST LUMPECTOMY WITH BRACKETED RADIOACTIVE SEEDS AND SENTINEL LYMPH NODE BIOPSY;   Surgeon: Rolm Bookbinder, MD;  Location: [place];  Service: General;  Laterality: Left;  . BREAST SURGERY Left    Lumpectomy  . CARDIOVASCULAR STRESS TEST  07/19/2006   EF 70%, NO EVIDENCE OF ISCHEMIA  . CHOLECYSTECTOMY  12/30/10  . COLONOSCOPY    . DILATION AND CURETTAGE OF UTERUS    . HYSTEROSCOPY W/D&C N/A 06/07/2018   Procedure: DILATATION AND CURETTAGE /HYSTEROSCOPY;  Surgeon: [name], MD;  Location: [facility];  Service: Gynecology;  Laterality: N/A;  . inguinal herniography    . NASAL SINUS SURGERY    . RADIOACTIVE SEED GUIDED EXCISIONAL BREAST BIOPSY Right 04/17/2018   Procedure: RIGHT BREAST SEED GUIDED EXCISIONAL BIOPSY;  Surgeon: Rolm Bookbinder, MD;  Location: [place];  Service: General;  Laterality: Right;  . RE-EXCISION OF BREAST LUMPECTOMY Left 05/15/2018   Procedure: RE-EXCISION OF LEFT BREAST LUMPECTOMY, ASPIRATION OF LEFT AXILLARY SEROMA;  Surgeon: Rolm Bookbinder, MD;  Location: [place];  Service: General;  Laterality: Left;  . T & A    . TOOTH EXTRACTION    . TOTAL KNEE ARTHROPLASTY Left 09/11/2017   Procedure: LEFT TOTAL KNEE ARTHROPLASTY;  Surgeon: Gaynelle Arabian, MD;  Location[facility];  Service: Orthopedics;  Laterality: Left;  with block  . US ECHOCARDIOGRAPHY  07/17/2006   EF 55-60%    There were no vitals filed for this visit.  Subjective Assessment - 01/03/19 1437    Subjective  pt says she was able to cook this morning, she made potato and leek soup that required alot of chopping . She still has fatigue and hot flashes     Pertinent History  Patient was diagnosed on 03/08/18 with left grade 1-2 invasive ductal carcinoma breast cancer.It measured 1.5 cm and is located in the upper outer quadrant. It is ER/PR positive and HER2 negative with a Ki67 of < 1%.  She had a left total knee replacement in 10/18 with Dr. Wynelle Link and continues to have pain in her left lateral knee. Her orthopedist reported it is  likely due to her iliotibial band. Patient reports she underwent a left lumpectomy and SNLB (0/4 nodes positive) removed on 04/17/18.. Radiation is now complete     Patient Stated Goals  Reduce breast swelling     Currently in Pain?  No/denies                       [facility] Adult PT Treatment/Exercise - 01/03/19 0001      Manual Therapy   Manual Lymphatic Drainage (MLD)  short neck, superficial and deep abdominals. right axially nodes, anterior interaxillary anastamosis, left axilla and axillo inguinal anastamsis, left breast then to sidelying for more work on lateral trunkk   firmness perceived in left breast and chest at pec major   Kinesiotex  Edema      Kinesiotix   Edema  4 pronged kinesiotpe from lateral breast to lateral chest                   PT Long Term Goals - 12/25/18 1310      PT LONG TERM GOAL #1   Title  Patient will demonstrate she has returned to baseline related to left shoulder ROM and function including WNL values of flexion and abduction without pulling    Baseline  06/20/18- 143 flexion, 112 abduction, 10/24/2018 140 degrees flexion and 170 degrees of abduction of left shoulder , 11/21/2018  155 degrees of flexion with pulling  12/25/2018/ 145 flexion and 160 degrees abduciton but still with pulling and pain     Time  4    Period  Weeks    Status  On-going      PT LONG TERM GOAL #2   Title  pt will report a decrease in seroma sensation in the under arm by at least 50%    Baseline  she feels fullness in the breast and axilla that is firm to touch  12/11 2019 decreased, but still fullness at the scar  12/25/2018, seroma has decreased since she started but she still has pain every day when she moves her arm     Time  4    Period  Weeks    Status  On-going      PT LONG TERM GOAL #3   Title  Pt will be independent with self MLD for the Lt lateral breast and seroma region    Status  Achieved      PT LONG TERM GOAL #4   Title  Pt will be set up  with how to obtain compression garments    Status  Achieved      PT LONG TERM GOAL #5   Title  Patient wil verbalize understanding of lymphedema risk reduction practices.    Status  Achieved            Plan - 01/03/19 1706    Clinical Impression Statement  Pt did well with ktape in axilla but has more palpabel fullness in breat today.  Added ktape to breast area. she continues to have tightness through axilla to breast especially when stretching her shoulder in sidelying     Rehab Potential  Excellent    Clinical Impairments Affecting Rehab Potential  None    PT Frequency  2x / week    PT Treatment/Interventions  ADLs/Self Care Home Management;Therapeutic activities;Therapeutic exercise;Patient/family education;Manual techniques;Manual lymph drainage;Scar mobilization;Passive range of motion    PT Next Visit Plan  Assess effect of kinesiotape  firm area on breast Continue on soft tissue work to posterior scapula and axilla work on shoulder abduction in sidelying, scapular strengthening, continue MLD to Lt lateral breast and seroma region with remedial range of motion exercises to neck upper thoracic spine and trunk. follow up with exercises as needed.  continue to explore compression pump, nighttime and Andrea garments      Consulted and Agree with Plan of Care  Patient       Patient will benefit from skilled therapeutic intervention in order to improve the following deficits and impairments:  Decreased knowledge of precautions, Impaired UE functional use, Decreased range of motion, Postural dysfunction, Decreased scar mobility, Decreased mobility, Pain, Increased fascial restricitons, Increased edema  Visit Diagnosis: Abnormal posture  Localized edema  Stiffness of left shoulder, not elsewhere classified  Pain in left arm  Muscle weakness (generalized)     Problem List Patient Active Problem List   Diagnosis Date Noted  . Genetic testing 04/02/2018  . Family history of  breast cancer   . Malignant neoplasm of upper-outer quadrant of left female breast ([place])   . Malignant neoplasm of upper-outer quadrant of left breast in female, estrogen receptor positive ([place]) 03/16/2018  . OA (osteoarthritis) of knee 09/11/2017  . Infrapatellar bursitis of right knee 04/29/2014  . Pneumonia, aspiration ([place]) 12/28/2012  . HYPOTHYROIDISM 11/15/2010  . HYPERLIPIDEMIA 11/15/2010  . DEPRESSION 11/15/2010  . GERD 11/15/2010  . CONSTIPATION 11/15/2010  . GALLSTONES 11/15/2010  . RENAL CYST 11/15/2010  . INSOMNIA UNSPECIFIED 11/15/2010  . COUGH 11/15/2010  . ABDOMINAL PAIN OTHER SPECIFIED SITE 11/15/2010  . TRANSAMINASES, SERUM, ELEVATED 11/15/2010   Donato Heinz. Owens Shark PT  Norwood Levo 01/03/2019, 5:08 PM  [place], [place], [address] Phone: [phone]   Fax:  [phone]  Name: [name] MRN: [id number] Date of Birth: 10-14-1946

## 2019-01-04 DIAGNOSIS — M25661 Stiffness of right knee, not elsewhere classified: Secondary | ICD-10-CM | POA: Diagnosis not present

## 2019-01-04 DIAGNOSIS — R262 Difficulty in walking, not elsewhere classified: Secondary | ICD-10-CM | POA: Diagnosis not present

## 2019-01-04 DIAGNOSIS — R531 Weakness: Secondary | ICD-10-CM | POA: Diagnosis not present

## 2019-01-04 DIAGNOSIS — G5782 Other specified mononeuropathies of left lower limb: Secondary | ICD-10-CM | POA: Diagnosis not present

## 2019-01-08 ENCOUNTER — Encounter: Payer: Self-pay | Admitting: Physical Therapy

## 2019-01-08 ENCOUNTER — Ambulatory Visit: Payer: Medicare Other | Admitting: Physical Therapy

## 2019-01-08 DIAGNOSIS — M6281 Muscle weakness (generalized): Secondary | ICD-10-CM | POA: Diagnosis not present

## 2019-01-08 DIAGNOSIS — M25612 Stiffness of left shoulder, not elsewhere classified: Secondary | ICD-10-CM

## 2019-01-08 DIAGNOSIS — M79602 Pain in left arm: Secondary | ICD-10-CM | POA: Diagnosis not present

## 2019-01-08 DIAGNOSIS — R293 Abnormal posture: Secondary | ICD-10-CM

## 2019-01-08 DIAGNOSIS — R600 Localized edema: Secondary | ICD-10-CM | POA: Diagnosis not present

## 2019-01-08 NOTE — Therapy (Signed)
[place], [place], [address] Phone: [phone]   Fax:  [phone]  Physical Therapy Treatment  Patient Details  Name: [name] MRN: [id number] Date of Birth: Aug 21, 1946 Referring Provider (PT): Dr. Donne Hazel    Encounter Date: 01/08/2019  PT End of Session - 01/08/19 1440    Visit Number  26    Number of Visits  33    Date for PT Re-Evaluation  01/25/19    Authorization Type  medicare/tricare    PT Start Time  1440    PT Stop Time  1519    PT Time Calculation (min)  39 min    Activity Tolerance  Patient tolerated treatment well       Past Medical History:  Diagnosis Date  . Arthritis    hands and knees  . Complication of anesthesia    aspiration pna; following a colonoscopy, pt requests head of bed elevated if possible.  . Constipation   . Cough   . Depression   . Dysrhythmia    RBBB on 06-06-17 ekg   . Elevated liver function tests   . Esophageal stricture   . Family history of breast cancer   . Gallstones   . Genetic testing 04/02/2018   STAT Breast panel with reflex to Multi-Cancer panel (83 genes) @ [facility] - No pathogenic mutations detected  . GERD (gastroesophageal reflux disease)   . Hiatal hernia   . History of kidney stones   . History of radiation therapy 07/09/2018- 08/03/18   Left Breast/ 40.05 Gy in 15 fractions. Left Breast boost 10 Gy in 5 fractions.   . Hyperlipemia   . Hypothyroidism   . Insomnia   . Malignant neoplasm of upper-outer quadrant of left female breast ([place])   . Nephrolithiasis   . Pneumonia 2015   aspirated after colonosopy  . Renal cyst     Past Surgical History:  Procedure Laterality Date  . BREAST LUMPECTOMY WITH RADIOACTIVE SEED AND SENTINEL LYMPH NODE BIOPSY Left 04/17/2018   Procedure: LEFT BREAST LUMPECTOMY WITH BRACKETED RADIOACTIVE SEEDS AND SENTINEL LYMPH NODE BIOPSY;  Surgeon: Rolm Bookbinder, MD;  Location: [place];  Service: General;  Laterality: Left;  . BREAST SURGERY Left    Lumpectomy  . CARDIOVASCULAR STRESS TEST  07/19/2006   EF 70%, NO EVIDENCE OF ISCHEMIA  . CHOLECYSTECTOMY  12/30/10  . COLONOSCOPY    . DILATION AND CURETTAGE OF UTERUS    . HYSTEROSCOPY W/D&C N/A 06/07/2018   Procedure: DILATATION AND CURETTAGE /HYSTEROSCOPY;  Surgeon: [name], MD;  Location: [facility];  Service: Gynecology;  Laterality: N/A;  . inguinal herniography    . NASAL SINUS SURGERY    . RADIOACTIVE SEED GUIDED EXCISIONAL BREAST BIOPSY Right 04/17/2018   Procedure: RIGHT BREAST SEED GUIDED EXCISIONAL BIOPSY;  Surgeon: Rolm Bookbinder, MD;  Location: [place];  Service: General;  Laterality: Right;  . RE-EXCISION OF BREAST LUMPECTOMY Left 05/15/2018   Procedure: RE-EXCISION OF LEFT BREAST LUMPECTOMY, ASPIRATION OF LEFT AXILLARY SEROMA;  Surgeon: Rolm Bookbinder, MD;  Location: [place];  Service: General;  Laterality: Left;  . T & A    . TOOTH EXTRACTION    . TOTAL KNEE ARTHROPLASTY Left 09/11/2017   Procedure: LEFT TOTAL KNEE ARTHROPLASTY;  Surgeon: Gaynelle Arabian, MD;  [facility];  Service: Orthopedics;  Laterality: Left;  with block  . US ECHOCARDIOGRAPHY  07/17/2006   EF 55-60%    There were  no vitals filed for this visit.                    [facility] Adult PT Treatment/Exercise - 01/08/19 0001      Manual Therapy   Soft tissue mobilization  with thick massage cream and pt in right sidelying, soft tissue work to tight muscles at posterior and infereior scapula , prolonged pressure at tight trigger point at posterior axilla     Manual Lymphatic Drainage (MLD)  short neck, superficial and deep abdominals. right axially nodes, anterior interaxillary anastamosis, left axilla and axillo inguinal anastamsis, left breast then to sidelying for more work on lateral trunkk   firmness perceived in left breast and chest at pec major   Kinesiotex  Edema       Kinesiotix   Edema  basketweave I band                   PT Long Term Goals - 12/25/18 1310      PT LONG TERM GOAL #1   Title  Patient will demonstrate she has returned to baseline related to left shoulder ROM and function including WNL values of flexion and abduction without pulling    Baseline  06/20/18- 143 flexion, 112 abduction, 10/24/2018 140 degrees flexion and 170 degrees of abduction of left shoulder , 11/21/2018  155 degrees of flexion with pulling  12/25/2018/ 145 flexion and 160 degrees abduciton but still with pulling and pain     Time  4    Period  Weeks    Status  On-going      PT LONG TERM GOAL #2   Title  pt will report a decrease in seroma sensation in the under arm by at least 50%    Baseline  she feels fullness in the breast and axilla that is firm to touch  12/11 2019 decreased, but still fullness at the scar  12/25/2018, seroma has decreased since she started but she still has pain every day when she moves her arm     Time  4    Period  Weeks    Status  On-going      PT LONG TERM GOAL #3   Title  Pt will be independent with self MLD for the Lt lateral breast and seroma region    Status  Achieved      PT LONG TERM GOAL #4   Title  Pt will be set up with how to obtain compression garments    Status  Achieved      PT LONG TERM GOAL #5   Title  Patient wil verbalize understanding of lymphedema risk reduction practices.    Status  Achieved            Plan - 01/08/19 1519    Clinical Impression Statement  Pt continues to have firmness and pain in left breast and stiffness in left shoudler.  Soft tissue work today and Engineer, water. Pt needs to have upgrade of shoulder exercise     Clinical Impairments Affecting Rehab Potential  None    PT Frequency  2x / week    PT Duration  4 weeks    PT Next Visit Plan  Assess effect of kinesiotape  firm area on breast Continue on soft tissue work to posterior scapula and axilla work on  shoulder abduction in sidelying, scapular strengthening, continue MLD to Lt lateral breast and seroma region with remedial range of motion exercises to neck upper  thoracic spine and trunk. upgrade  exercises        Patient will benefit from skilled therapeutic intervention in order to improve the following deficits and impairments:     Visit Diagnosis: Abnormal posture  Localized edema  Stiffness of left shoulder, not elsewhere classified  Pain in left arm  Muscle weakness (generalized)     Problem List Patient Active Problem List   Diagnosis Date Noted  . Genetic testing 04/02/2018  . Family history of breast cancer   . Malignant neoplasm of upper-outer quadrant of left female breast ([place])   . Malignant neoplasm of upper-outer quadrant of left breast in female, estrogen receptor positive ([place]) 03/16/2018  . OA (osteoarthritis) of knee 09/11/2017  . Infrapatellar bursitis of right knee 04/29/2014  . Pneumonia, aspiration ([place]) 12/28/2012  . HYPOTHYROIDISM 11/15/2010  . HYPERLIPIDEMIA 11/15/2010  . DEPRESSION 11/15/2010  . GERD 11/15/2010  . CONSTIPATION 11/15/2010  . GALLSTONES 11/15/2010  . RENAL CYST 11/15/2010  . INSOMNIA UNSPECIFIED 11/15/2010  . COUGH 11/15/2010  . ABDOMINAL PAIN OTHER SPECIFIED SITE 11/15/2010  . TRANSAMINASES, SERUM, ELEVATED 11/15/2010   Donato Heinz. Owens Shark PT  Norwood Levo 01/08/2019, 3:22 PM  [place], [place], [address] Phone: [phone]   Fax:  [phone]  Name: [name] MRN: [id number] Date of Birth: 1946/08/28

## 2019-01-09 DIAGNOSIS — G5782 Other specified mononeuropathies of left lower limb: Secondary | ICD-10-CM | POA: Diagnosis not present

## 2019-01-09 DIAGNOSIS — R262 Difficulty in walking, not elsewhere classified: Secondary | ICD-10-CM | POA: Diagnosis not present

## 2019-01-09 DIAGNOSIS — R531 Weakness: Secondary | ICD-10-CM | POA: Diagnosis not present

## 2019-01-09 DIAGNOSIS — M25661 Stiffness of right knee, not elsewhere classified: Secondary | ICD-10-CM | POA: Diagnosis not present

## 2019-01-10 ENCOUNTER — Ambulatory Visit (INDEPENDENT_AMBULATORY_CARE_PROVIDER_SITE_OTHER): Payer: Medicare Other | Admitting: Family Medicine

## 2019-01-10 ENCOUNTER — Encounter: Payer: Self-pay | Admitting: Physical Therapy

## 2019-01-10 ENCOUNTER — Encounter (INDEPENDENT_AMBULATORY_CARE_PROVIDER_SITE_OTHER): Payer: Self-pay | Admitting: Family Medicine

## 2019-01-10 ENCOUNTER — Ambulatory Visit: Payer: Medicare Other | Admitting: Physical Therapy

## 2019-01-10 DIAGNOSIS — R600 Localized edema: Secondary | ICD-10-CM

## 2019-01-10 DIAGNOSIS — M25562 Pain in left knee: Secondary | ICD-10-CM

## 2019-01-10 DIAGNOSIS — M6281 Muscle weakness (generalized): Secondary | ICD-10-CM | POA: Diagnosis not present

## 2019-01-10 DIAGNOSIS — R293 Abnormal posture: Secondary | ICD-10-CM | POA: Diagnosis not present

## 2019-01-10 DIAGNOSIS — M25612 Stiffness of left shoulder, not elsewhere classified: Secondary | ICD-10-CM | POA: Diagnosis not present

## 2019-01-10 DIAGNOSIS — G8929 Other chronic pain: Secondary | ICD-10-CM | POA: Diagnosis not present

## 2019-01-10 DIAGNOSIS — M79602 Pain in left arm: Secondary | ICD-10-CM

## 2019-01-10 NOTE — Progress Notes (Signed)
Office Visit Note   Patient: [name]           Date of Birth: Nov 14, 1946           MRN: [id number] Visit Date: 01/10/2019 Requested by: Shon Baton, [address] [place], [place] [address] PCP: Shon Baton, MD  Subjective: Chief Complaint  Patient presents with  . Left Knee - Pain    S/p TKA 16 months ago and then arthroscopy in 10/19 to remove scar tissue - Dr. Wynelle Link.  Been seeing Barbaraann Barthel for PT - referred here.    HPI: She is a 73 year old with left knee pain.  She had knee replacement and in October 2008.  She had patella clunk syndrome and had scar tissue removed in October 2019.  The mechanical symptoms are gone, but she continues to have pain mostly on the lateral aspect and sometimes on the anterior aspect, when getting up and down out of a chair or when going upstairs.  No personal or family history of gout or other rheumatologic disease.  She had labs drawn recently to monitor liver function and hypothyroidism but she does not know the results.  She is not on any new medications.  She is wondering whether she has something wrong with her immune function.  She has had shingles more than 7 times.  She has a history of keloid scar formation.              ROS: Denies any fevers, chills, night sweats, unintentional weight change.  No other joints have been bothering her.  All other systems were reviewed and are negative.  Objective: Vital Signs: There were no vitals taken for this visit.  Physical Exam:  Left knee: No effusion, no warmth or erythema.  Trace patellofemoral crepitus.  She has almost full extension, flexion of little more than 110 degrees today.  She has a thickened very tender band of tissue posterior to the lateral patellofemoral joint.  She is also tender just medial to the patella tendon and just proximal to the tibial tubercle.  Imaging: MSK-US: Examination of the lateral knee in the area of tenderness reveals thickened tissue with  hyperemia on power Doppler imaging.  Possible synovial plica.  Assessment & Plan: 1.  Chronic left knee pain status post replacement and arthroscopic debridement, with inflammatory changes on ultrasound imaging. -We will request her recent lab results to see what has been drawn.  We will then order some additional labs to further evaluate.  Possibly CRP, ESR, ANA, RF, Uric acid, anti-CCP antibody, TPO antibody.   Follow-Up Instructions: No follow-ups on file.      Procedures: No procedures performed  No notes on file    PMFS History: Patient Active Problem List   Diagnosis Date Noted  . Genetic testing 04/02/2018  . Family history of breast cancer   . Malignant neoplasm of upper-outer quadrant of left female breast ([place])   . Malignant neoplasm of upper-outer quadrant of left breast in female, estrogen receptor positive ([place]) 03/16/2018  . OA (osteoarthritis) of knee 09/11/2017  . Infrapatellar bursitis of right knee 04/29/2014  . Pneumonia, aspiration ([place]) 12/28/2012  . HYPOTHYROIDISM 11/15/2010  . HYPERLIPIDEMIA 11/15/2010  . DEPRESSION 11/15/2010  . GERD 11/15/2010  . CONSTIPATION 11/15/2010  . GALLSTONES 11/15/2010  . RENAL CYST 11/15/2010  . INSOMNIA UNSPECIFIED 11/15/2010  . COUGH 11/15/2010  . ABDOMINAL PAIN OTHER SPECIFIED SITE 11/15/2010  . TRANSAMINASES, SERUM, ELEVATED 11/15/2010   Past Medical History:  Diagnosis Date  .  Arthritis    hands and knees  . Complication of anesthesia    aspiration pna; following a colonoscopy, pt requests head of bed elevated if possible.  . Constipation   . Cough   . Depression   . Dysrhythmia    RBBB on 06-06-17 ekg   . Elevated liver function tests   . Esophageal stricture   . Family history of breast cancer   . Gallstones   . Genetic testing 04/02/2018   STAT Breast panel with reflex to Multi-Cancer panel (83 genes) @ [facility] - No pathogenic mutations detected  . GERD (gastroesophageal reflux disease)   . Hiatal  hernia   . History of kidney stones   . History of radiation therapy 07/09/2018- 08/03/18   Left Breast/ 40.05 Gy in 15 fractions. Left Breast boost 10 Gy in 5 fractions.   . Hyperlipemia   . Hypothyroidism   . Insomnia   . Malignant neoplasm of upper-outer quadrant of left female breast ([place])   . Nephrolithiasis   . Pneumonia 2015   aspirated after colonosopy  . Renal cyst     Family History  Problem Relation Age of Onset  . Breast cancer Mother 79       deceased 88; TAH/BSO in 40s/50s  . Dementia Father        deceased 30  . Breast cancer Maternal Aunt 82       deceased 37s  . Colon cancer Neg Hx   . Stomach cancer Neg Hx     Past Surgical History:  Procedure Laterality Date  . BREAST LUMPECTOMY WITH RADIOACTIVE SEED AND SENTINEL LYMPH NODE BIOPSY Left 04/17/2018   Procedure: LEFT BREAST LUMPECTOMY WITH BRACKETED RADIOACTIVE SEEDS AND SENTINEL LYMPH NODE BIOPSY;  Surgeon: Rolm Bookbinder, MD;  Location: [facility];  Service: General;  Laterality: Left;  . BREAST SURGERY Left    Lumpectomy  . CARDIOVASCULAR STRESS TEST  07/19/2006   EF 70%, NO EVIDENCE OF ISCHEMIA  . CHOLECYSTECTOMY  12/30/10  . COLONOSCOPY    . DILATION AND CURETTAGE OF UTERUS    . HYSTEROSCOPY W/D&C N/A 06/07/2018   Procedure: DILATATION AND CURETTAGE /HYSTEROSCOPY;  Surgeon: [name], MD;  Location: [facility];  Service: Gynecology;  Laterality: N/A;  . inguinal herniography    . NASAL SINUS SURGERY    . RADIOACTIVE SEED GUIDED EXCISIONAL BREAST BIOPSY Right 04/17/2018   Procedure: RIGHT BREAST SEED GUIDED EXCISIONAL BIOPSY;  Surgeon: Rolm Bookbinder, MD;  Location: [facility];  Service: General;  Laterality: Right;  . RE-EXCISION OF BREAST LUMPECTOMY Left 05/15/2018   Procedure: RE-EXCISION OF LEFT BREAST LUMPECTOMY, ASPIRATION OF LEFT AXILLARY SEROMA;  Surgeon: Rolm Bookbinder, MD;  Location: [facility];  Service: General;  Laterality: Left;  . T & A    .  TOOTH EXTRACTION    . TOTAL KNEE ARTHROPLASTY Left 09/11/2017   Procedure: LEFT TOTAL KNEE ARTHROPLASTY;  Surgeon: Gaynelle Arabian, MD;  [facility];  Service: Orthopedics;  Laterality: Left;  with block  . US ECHOCARDIOGRAPHY  07/17/2006   EF 55-60%   Social History   Occupational History  . 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 Smoking status: Never Smoker  . Smokeless tobacco: Never Used  Substance and Sexual Activity  . Alcohol use: No  . Drug use: No  . Sexual activity: Not on file

## 2019-01-10 NOTE — Therapy (Signed)
[place], [place], [address] Phone: [phone]   Fax:  [phone]  Physical Therapy Treatment  Patient Details  Name: [name] MRN: [id number] Date of Birth: 1946/12/05 Referring Provider (PT): Dr. Donne Hazel    Encounter Date: 01/10/2019  PT End of Session - 01/10/19 1741    Visit Number  27    Number of Visits  33    Date for PT Re-Evaluation  01/25/19    PT Start Time  1430    PT Stop Time  1515    PT Time Calculation (min)  45 min    Activity Tolerance  Patient tolerated treatment well    Behavior During Therapy  [facility] for tasks assessed/performed       Past Medical History:  Diagnosis Date  . Arthritis    hands and knees  . Complication of anesthesia    aspiration pna; following a colonoscopy, pt requests head of bed elevated if possible.  . Constipation   . Cough   . Depression   . Dysrhythmia    RBBB on 06-06-17 ekg   . Elevated liver function tests   . Esophageal stricture   . Family history of breast cancer   . Gallstones   . Genetic testing 04/02/2018   STAT Breast panel with reflex to Multi-Cancer panel (83 genes) @ [facility] - No pathogenic mutations detected  . GERD (gastroesophageal reflux disease)   . Hiatal hernia   . History of kidney stones   . History of radiation therapy 07/09/2018- 08/03/18   Left Breast/ 40.05 Gy in 15 fractions. Left Breast boost 10 Gy in 5 fractions.   . Hyperlipemia   . Hypothyroidism   . Insomnia   . Malignant neoplasm of upper-outer quadrant of left female breast ([place])   . Nephrolithiasis   . Pneumonia 2015   aspirated after colonosopy  . Renal cyst     Past Surgical History:  Procedure Laterality Date  . BREAST LUMPECTOMY WITH RADIOACTIVE SEED AND SENTINEL LYMPH NODE BIOPSY Left 04/17/2018   Procedure: LEFT BREAST LUMPECTOMY WITH BRACKETED RADIOACTIVE SEEDS AND SENTINEL LYMPH NODE BIOPSY;  Surgeon: Rolm Bookbinder, MD;  Location:  [place];  Service: General;  Laterality: Left;  . BREAST SURGERY Left    Lumpectomy  . CARDIOVASCULAR STRESS TEST  07/19/2006   EF 70%, NO EVIDENCE OF ISCHEMIA  . CHOLECYSTECTOMY  12/30/10  . COLONOSCOPY    . DILATION AND CURETTAGE OF UTERUS    . HYSTEROSCOPY W/D&C N/A 06/07/2018   Procedure: DILATATION AND CURETTAGE /HYSTEROSCOPY;  Surgeon: [name], MD;  Location: [facility];  Service: Gynecology;  Laterality: N/A;  . inguinal herniography    . NASAL SINUS SURGERY    . RADIOACTIVE SEED GUIDED EXCISIONAL BREAST BIOPSY Right 04/17/2018   Procedure: RIGHT BREAST SEED GUIDED EXCISIONAL BIOPSY;  Surgeon: Rolm Bookbinder, MD;  Location: [place];  Service: General;  Laterality: Right;  . RE-EXCISION OF BREAST LUMPECTOMY Left 05/15/2018   Procedure: RE-EXCISION OF LEFT BREAST LUMPECTOMY, ASPIRATION OF LEFT AXILLARY SEROMA;  Surgeon: Rolm Bookbinder, MD;  Location: [place];  Service: General;  Laterality: Left;  . T & A    . TOOTH EXTRACTION    . TOTAL KNEE ARTHROPLASTY Left 09/11/2017   Procedure: LEFT TOTAL KNEE ARTHROPLASTY;  Surgeon: Gaynelle Arabian, MD;  Location: [facility];  Service: Orthopedics;  Laterality: Left;  with block  . US ECHOCARDIOGRAPHY  07/17/2006   EF 55-60%  There were no vitals filed for this visit.  Subjective Assessment - 01/10/19 1441    Subjective  Pt states she took the kinesiotape off last night as it was pulling.  She went to a "soft tissue specialist" today who will continue investigation about why she has such pain. She brings in the new compression sleeve that she received in the mail yesteray and it seems to fit fine.     Pertinent History  Patient was diagnosed on 03/08/18 with left grade 1-2 invasive ductal carcinoma breast cancer.It measured 1.5 cm and is located in the upper outer quadrant. It is ER/PR positive and HER2 negative with a Ki67 of < 1%.  She had a left total knee replacement in 10/18 with Dr.  Wynelle Link and continues to have pain in her left lateral knee. Her orthopedist reported it is likely due to her iliotibial band. Patient reports she underwent a left lumpectomy and SNLB (0/4 nodes positive) removed on 04/17/18.. Radiation is now complete     Patient Stated Goals  Reduce breast swelling     Currently in Pain?  No/denies   she has knee pain                       [facility] Adult PT Treatment/Exercise - 01/10/19 0001      Manual Therapy   Manual Therapy  Manual Lymphatic Drainage (MLD)    Manual Lymphatic Drainage (MLD)  short neck, superficial and deep abdominals. right axially nodes, anterior interaxillary anastamosis, left axilla and axillo inguinal anastamsis, left breast then to sidelying for more work on lateral trunkk   firmness perceived in left breast and chest at [employer] major                 PT Long Term Goals - 12/25/18 1310      PT LONG TERM GOAL #1   Title  Patient will demonstrate she has returned to baseline related to left shoulder ROM and function including WNL values of flexion and abduction without pulling    Baseline  06/20/18- 143 flexion, 112 abduction, 10/24/2018 140 degrees flexion and 170 degrees of abduction of left shoulder , 11/21/2018  155 degrees of flexion with pulling  12/25/2018/ 145 flexion and 160 degrees abduciton but still with pulling and pain     Time  4    Period  Weeks    Status  On-going      PT LONG TERM GOAL #2   Title  pt will report a decrease in seroma sensation in the under arm by at least 50%    Baseline  she feels fullness in the breast and axilla that is firm to touch  12/11 2019 decreased, but still fullness at the scar  12/25/2018, seroma has decreased since she started but she still has pain every day when she moves her arm     Time  4    Period  Weeks    Status  On-going      PT LONG TERM GOAL #3   Title  Pt will be independent with self MLD for the Lt lateral breast and seroma region    Status  Achieved       PT LONG TERM GOAL #4   Title  Pt will be set up with how to obtain compression garments    Status  Achieved      PT LONG TERM GOAL #5   Title  Patient wil verbalize understanding of lymphedema risk  reduction practices.    Status  Achieved            Plan - 01/10/19 1741    Clinical Impression Statement  pt got her compression sleeve and it seems to fit well. Her breast fullness remains despite MLD and compression, but her axillary fullness is better.  She wil try to go without the compression to see if she is worse, if not will stop the compression     PT Treatment/Interventions  ADLs/Self Care Home Management;Therapeutic activities;Therapeutic exercise;Patient/family education;Manual techniques;Manual lymph drainage;Scar mobilization;Passive range of motion    PT Next Visit Plan   Continue on soft tissue work to posterior scapula and axilla work on shoulder abduction in sidelying, scapular strengthening, continue MLD to Lt lateral breast and seroma region with remedial range of motion exercises to neck upper thoracic spine and trunk. upgrade  exercises        Patient will benefit from skilled therapeutic intervention in order to improve the following deficits and impairments:  Decreased knowledge of precautions, Impaired UE functional use, Decreased range of motion, Postural dysfunction, Decreased scar mobility, Decreased mobility, Pain, Increased fascial restricitons, Increased edema  Visit Diagnosis: Abnormal posture  Localized edema  Stiffness of left shoulder, not elsewhere classified  Pain in left arm  Muscle weakness (generalized)     Problem List Patient Active Problem List   Diagnosis Date Noted  . Genetic testing 04/02/2018  . Family history of breast cancer   . Malignant neoplasm of upper-outer quadrant of left female breast ([place])   . Malignant neoplasm of upper-outer quadrant of left breast in female, estrogen receptor positive ([place]) 03/16/2018  . OA  (osteoarthritis) of knee 09/11/2017  . Infrapatellar bursitis of right knee 04/29/2014  . Pneumonia, aspiration ([place]) 12/28/2012  . HYPOTHYROIDISM 11/15/2010  . HYPERLIPIDEMIA 11/15/2010  . DEPRESSION 11/15/2010  . GERD 11/15/2010  . CONSTIPATION 11/15/2010  . GALLSTONES 11/15/2010  . RENAL CYST 11/15/2010  . INSOMNIA UNSPECIFIED 11/15/2010  . COUGH 11/15/2010  . ABDOMINAL PAIN OTHER SPECIFIED SITE 11/15/2010  . TRANSAMINASES, SERUM, ELEVATED 11/15/2010   Donato Heinz. Owens Shark PT  Norwood Levo 01/10/2019, 5:43 PM  [place], [place], [address] Phone: [phone]   Fax:  [phone]  Name: [name] MRN: [id number] Date of Birth: 1946/10/09

## 2019-01-11 DIAGNOSIS — G5782 Other specified mononeuropathies of left lower limb: Secondary | ICD-10-CM | POA: Diagnosis not present

## 2019-01-11 DIAGNOSIS — M25661 Stiffness of right knee, not elsewhere classified: Secondary | ICD-10-CM | POA: Diagnosis not present

## 2019-01-11 DIAGNOSIS — R531 Weakness: Secondary | ICD-10-CM | POA: Diagnosis not present

## 2019-01-11 DIAGNOSIS — R262 Difficulty in walking, not elsewhere classified: Secondary | ICD-10-CM | POA: Diagnosis not present

## 2019-01-14 DIAGNOSIS — R531 Weakness: Secondary | ICD-10-CM | POA: Diagnosis not present

## 2019-01-14 DIAGNOSIS — G5782 Other specified mononeuropathies of left lower limb: Secondary | ICD-10-CM | POA: Diagnosis not present

## 2019-01-14 DIAGNOSIS — M25661 Stiffness of right knee, not elsewhere classified: Secondary | ICD-10-CM | POA: Diagnosis not present

## 2019-01-14 DIAGNOSIS — R262 Difficulty in walking, not elsewhere classified: Secondary | ICD-10-CM | POA: Diagnosis not present

## 2019-01-15 ENCOUNTER — Ambulatory Visit: Payer: Medicare Other | Attending: General Surgery | Admitting: Physical Therapy

## 2019-01-15 DIAGNOSIS — M25612 Stiffness of left shoulder, not elsewhere classified: Secondary | ICD-10-CM | POA: Diagnosis not present

## 2019-01-15 DIAGNOSIS — M6281 Muscle weakness (generalized): Secondary | ICD-10-CM | POA: Insufficient documentation

## 2019-01-15 DIAGNOSIS — R293 Abnormal posture: Secondary | ICD-10-CM | POA: Diagnosis not present

## 2019-01-15 DIAGNOSIS — R600 Localized edema: Secondary | ICD-10-CM | POA: Insufficient documentation

## 2019-01-15 DIAGNOSIS — M79602 Pain in left arm: Secondary | ICD-10-CM | POA: Diagnosis not present

## 2019-01-15 NOTE — Therapy (Signed)
[place], [place], [address] Phone: [phone]   Fax:  [phone]  Physical Therapy Treatment  Patient Details  Name: [name] MRN: [id number] Date of Birth: 1946-07-15 Referring Provider (PT): Dr. Donne Hazel    Encounter Date: 01/15/2019  PT End of Session - 01/15/19 1655    Visit Number  28    Number of Visits  33    Date for PT Re-Evaluation  01/25/19    Authorization Type  medicare/tricare    PT Start Time  1300    PT Stop Time  1345    PT Time Calculation (min)  45 min    Activity Tolerance  Patient tolerated treatment well    Behavior During Therapy  [facility] for tasks assessed/performed       Past Medical History:  Diagnosis Date  . Arthritis    hands and knees  . Complication of anesthesia    aspiration pna; following a colonoscopy, pt requests head of bed elevated if possible.  . Constipation   . Cough   . Depression   . Dysrhythmia    RBBB on 06-06-17 ekg   . Elevated liver function tests   . Esophageal stricture   . Family history of breast cancer   . Gallstones   . Genetic testing 04/02/2018   STAT Breast panel with reflex to Multi-Cancer panel (83 genes) @ [facility] - No pathogenic mutations detected  . GERD (gastroesophageal reflux disease)   . Hiatal hernia   . History of kidney stones   . History of radiation therapy 07/09/2018- 08/03/18   Left Breast/ 40.05 Gy in 15 fractions. Left Breast boost 10 Gy in 5 fractions.   . Hyperlipemia   . Hypothyroidism   . Insomnia   . Malignant neoplasm of upper-outer quadrant of left female breast ([place])   . Nephrolithiasis   . Pneumonia 2015   aspirated after colonosopy  . Renal cyst     Past Surgical History:  Procedure Laterality Date  . BREAST LUMPECTOMY WITH RADIOACTIVE SEED AND SENTINEL LYMPH NODE BIOPSY Left 04/17/2018   Procedure: LEFT BREAST LUMPECTOMY WITH BRACKETED RADIOACTIVE SEEDS AND SENTINEL LYMPH NODE BIOPSY;   Surgeon: Rolm Bookbinder, MD;  Location: [place];  Service: General;  Laterality: Left;  . BREAST SURGERY Left    Lumpectomy  . CARDIOVASCULAR STRESS TEST  07/19/2006   EF 70%, NO EVIDENCE OF ISCHEMIA  . CHOLECYSTECTOMY  12/30/10  . COLONOSCOPY    . DILATION AND CURETTAGE OF UTERUS    . HYSTEROSCOPY W/D&C N/A 06/07/2018   Procedure: DILATATION AND CURETTAGE /HYSTEROSCOPY;  Surgeon: [name], MD;  Location: [facility];  Service: Gynecology;  Laterality: N/A;  . inguinal herniography    . NASAL SINUS SURGERY    . RADIOACTIVE SEED GUIDED EXCISIONAL BREAST BIOPSY Right 04/17/2018   Procedure: RIGHT BREAST SEED GUIDED EXCISIONAL BIOPSY;  Surgeon: Rolm Bookbinder, MD;  Location: [place];  Service: General;  Laterality: Right;  . RE-EXCISION OF BREAST LUMPECTOMY Left 05/15/2018   Procedure: RE-EXCISION OF LEFT BREAST LUMPECTOMY, ASPIRATION OF LEFT AXILLARY SEROMA;  Surgeon: Rolm Bookbinder, MD;  Location: [facility];  Service: General;  Laterality: Left;  . T & A    . TOOTH EXTRACTION    . TOTAL KNEE ARTHROPLASTY Left 09/11/2017   Procedure: LEFT TOTAL KNEE ARTHROPLASTY;  Surgeon: Gaynelle Arabian, MD;  Location: [facility];  Service: Orthopedics;  Laterality: Left;  with block  . US ECHOCARDIOGRAPHY  07/17/2006   EF 55-60%    There were no vitals filed for this visit.  Subjective Assessment - 01/15/19 1653    Subjective  Pt has not been wearing the chip pack in her bra and feels that there is no difference in her breast pain. she thinks the fullness is ok today     Pertinent History  Patient was diagnosed on 03/08/18 with left grade 1-2 invasive ductal carcinoma breast cancer.It measured 1.5 cm and is located in the upper outer quadrant. It is ER/PR positive and HER2 negative with a Ki67 of < 1%.  She had a left total knee replacement in 10/18 with Dr. Wynelle Link and continues to have pain in her left lateral knee. Her orthopedist reported it is likely  due to her iliotibial band. Patient reports she underwent a left lumpectomy and SNLB (0/4 nodes positive) removed on 04/17/18.. Radiation is now complete     Patient Stated Goals  Reduce breast swelling     Currently in Pain?  No/denies   pain with pressure on her breast                       [facility] Adult PT Treatment/Exercise - 01/15/19 0001      Manual Therapy   Soft tissue mobilization  with thick massage cream and pt in right sidelying, soft tissue work to tight muscles at posterior and infereior scapula , prolonged pressure at tight trigger point at posterior axilla     Manual Lymphatic Drainage (MLD)  short neck, superficial and deep abdominals. right axially nodes, anterior interaxillary anastamosis, left axilla and axillo inguinal anastamsis, left breast then to sidelying for more work on lateral trunkk   firmness perceived in left breast and chest at [employer] major                 PT Long Term Goals - 12/25/18 1310      PT LONG TERM GOAL #1   Title  Patient will demonstrate she has returned to baseline related to left shoulder ROM and function including WNL values of flexion and abduction without pulling    Baseline  06/20/18- 143 flexion, 112 abduction, 10/24/2018 140 degrees flexion and 170 degrees of abduction of left shoulder , 11/21/2018  155 degrees of flexion with pulling  12/25/2018/ 145 flexion and 160 degrees abduciton but still with pulling and pain     Time  4    Period  Weeks    Status  On-going      PT LONG TERM GOAL #2   Title  pt will report a decrease in seroma sensation in the under arm by at least 50%    Baseline  she feels fullness in the breast and axilla that is firm to touch  12/11 2019 decreased, but still fullness at the scar  12/25/2018, seroma has decreased since she started but she still has pain every day when she moves her arm     Time  4    Period  Weeks    Status  On-going      PT LONG TERM GOAL #3   Title  Pt will be  independent with self MLD for the Lt lateral breast and seroma region    Status  Achieved      PT LONG TERM GOAL #4   Title  Pt will be set up with how to obtain compression garments    Status  Achieved      PT  LONG TERM GOAL #5   Title  Patient wil verbalize understanding of lymphedema risk reduction practices.    Status  Achieved            Plan - 01/15/19 1655    Clinical Impression Statement  Pt is doing ok without extra chip packs in her bra and feels that she is doing her exercises at the gym [employer] dowel rod exercise today.  Pt had some decrease in firmness of scapluar and chest muscles with soft tissue work, but it comes back after a short time.  She will try to manage on her own at home and only come one time this week to see how she does.  If she does ok , then she will be ready to discharge after next visit.     Rehab Potential  Excellent    Clinical Impairments Affecting Rehab Potential  None    PT Frequency  2x / week    PT Duration  4 weeks    PT Treatment/Interventions  ADLs/Self Care Home Management;Therapeutic activities;Therapeutic exercise;Patient/family education;Manual techniques;Manual lymph drainage;Scar mobilization;Passive range of motion    PT Next Visit Plan   Continue on soft tissue work to posterior scapula and axilla work on shoulder abduction in sidelying, scapular strengthening, continue MLD to Lt lateral breast and seroma region with remedial range of motion exercises to neck upper thoracic spine and trunk. Possibly dishcarge this session     Consulted and Agree with Plan of Care  Patient       Patient will benefit from skilled therapeutic intervention in order to improve the following deficits and impairments:  Decreased knowledge of precautions, Impaired UE functional use, Decreased range of motion, Postural dysfunction, Decreased scar mobility, Decreased mobility, Pain, Increased fascial restricitons, Increased edema  Visit Diagnosis: Abnormal  posture  Localized edema  Stiffness of left shoulder, not elsewhere classified  Pain in left arm  Muscle weakness (generalized)     Problem List Patient Active Problem List   Diagnosis Date Noted  . Genetic testing 04/02/2018  . Family history of breast cancer   . Malignant neoplasm of upper-outer quadrant of left female breast ([place])   . Malignant neoplasm of upper-outer quadrant of left breast in female, estrogen receptor positive ([place]) 03/16/2018  . OA (osteoarthritis) of knee 09/11/2017  . Infrapatellar bursitis of right knee 04/29/2014  . Pneumonia, aspiration ([place]) 12/28/2012  . HYPOTHYROIDISM 11/15/2010  . HYPERLIPIDEMIA 11/15/2010  . DEPRESSION 11/15/2010  . GERD 11/15/2010  . CONSTIPATION 11/15/2010  . GALLSTONES 11/15/2010  . RENAL CYST 11/15/2010  . INSOMNIA UNSPECIFIED 11/15/2010  . COUGH 11/15/2010  . ABDOMINAL PAIN OTHER SPECIFIED SITE 11/15/2010  . TRANSAMINASES, SERUM, ELEVATED 11/15/2010   Donato Heinz. Owens Shark PT  Norwood Levo 01/15/2019, 4:59 PM  [place], [place], [address] Phone: [phone]   Fax:  [phone]  Name: MONI ROTHROCK MRN: [id number] Date of Birth: Nov 20, 1946

## 2019-01-17 ENCOUNTER — Encounter: Payer: Medicare Other | Admitting: Physical Therapy

## 2019-01-21 DIAGNOSIS — R262 Difficulty in walking, not elsewhere classified: Secondary | ICD-10-CM | POA: Diagnosis not present

## 2019-01-21 DIAGNOSIS — R531 Weakness: Secondary | ICD-10-CM | POA: Diagnosis not present

## 2019-01-21 DIAGNOSIS — M25661 Stiffness of right knee, not elsewhere classified: Secondary | ICD-10-CM | POA: Diagnosis not present

## 2019-01-21 DIAGNOSIS — G5782 Other specified mononeuropathies of left lower limb: Secondary | ICD-10-CM | POA: Diagnosis not present

## 2019-01-22 ENCOUNTER — Ambulatory Visit: Payer: Medicare Other | Admitting: Physical Therapy

## 2019-01-22 ENCOUNTER — Encounter: Payer: Self-pay | Admitting: Physical Therapy

## 2019-01-22 DIAGNOSIS — M79602 Pain in left arm: Secondary | ICD-10-CM

## 2019-01-22 DIAGNOSIS — M6281 Muscle weakness (generalized): Secondary | ICD-10-CM

## 2019-01-22 DIAGNOSIS — R293 Abnormal posture: Secondary | ICD-10-CM | POA: Diagnosis not present

## 2019-01-22 DIAGNOSIS — M25612 Stiffness of left shoulder, not elsewhere classified: Secondary | ICD-10-CM

## 2019-01-22 DIAGNOSIS — R600 Localized edema: Secondary | ICD-10-CM | POA: Diagnosis not present

## 2019-01-22 NOTE — Therapy (Signed)
[place], [place], [address] Phone: [phone]   Fax:  [phone]  Physical Therapy Treatment  Patient Details  Name: [name] MRN: [id number] Date of Birth: January 06, 1946 Referring Provider (PT): Dr. Donne Hazel    Encounter Date: 01/22/2019  PT End of Session - 01/22/19 1706    Visit Number  29    Number of Visits  33    Date for PT Re-Evaluation  01/25/19    Authorization Type  medicare/tricare    PT Start Time  1300    PT Stop Time  1345    PT Time Calculation (min)  45 min    Activity Tolerance  Patient tolerated treatment well    Behavior During Therapy  [facility] for tasks assessed/performed       Past Medical History:  Diagnosis Date  . Arthritis    hands and knees  . Complication of anesthesia    aspiration pna; following a colonoscopy, pt requests head of bed elevated if possible.  . Constipation   . Cough   . Depression   . Dysrhythmia    RBBB on 06-06-17 ekg   . Elevated liver function tests   . Esophageal stricture   . Family history of breast cancer   . Gallstones   . Genetic testing 04/02/2018   STAT Breast panel with reflex to Multi-Cancer panel (83 genes) @ [facility] - No pathogenic mutations detected  . GERD (gastroesophageal reflux disease)   . Hiatal hernia   . History of kidney stones   . History of radiation therapy 07/09/2018- 08/03/18   Left Breast/ 40.05 Gy in 15 fractions. Left Breast boost 10 Gy in 5 fractions.   . Hyperlipemia   . Hypothyroidism   . Insomnia   . Malignant neoplasm of upper-outer quadrant of left female breast ([place])   . Nephrolithiasis   . Pneumonia 2015   aspirated after colonosopy  . Renal cyst     Past Surgical History:  Procedure Laterality Date  . BREAST LUMPECTOMY WITH RADIOACTIVE SEED AND SENTINEL LYMPH NODE BIOPSY Left 04/17/2018   Procedure: LEFT BREAST LUMPECTOMY WITH BRACKETED RADIOACTIVE SEEDS AND SENTINEL LYMPH NODE BIOPSY;   Surgeon: Rolm Bookbinder, MD;  Location: [facility];  Service: General;  Laterality: Left;  . BREAST SURGERY Left    Lumpectomy  . CARDIOVASCULAR STRESS TEST  07/19/2006   EF 70%, NO EVIDENCE OF ISCHEMIA  . CHOLECYSTECTOMY  12/30/10  . COLONOSCOPY    . DILATION AND CURETTAGE OF UTERUS    . HYSTEROSCOPY W/D&C N/A 06/07/2018   Procedure: DILATATION AND CURETTAGE /HYSTEROSCOPY;  Surgeon: [name], MD;  Location: [facility];  Service: Gynecology;  Laterality: N/A;  . inguinal herniography    . NASAL SINUS SURGERY    . RADIOACTIVE SEED GUIDED EXCISIONAL BREAST BIOPSY Right 04/17/2018   Procedure: RIGHT BREAST SEED GUIDED EXCISIONAL BIOPSY;  Surgeon: Rolm Bookbinder, MD;  Location: [facility];  Service: General;  Laterality: Right;  . RE-EXCISION OF BREAST LUMPECTOMY Left 05/15/2018   Procedure: RE-EXCISION OF LEFT BREAST LUMPECTOMY, ASPIRATION OF LEFT AXILLARY SEROMA;  Surgeon: Rolm Bookbinder, MD;  Location: [facility];  Service: General;  Laterality: Left;  . T & A    . TOOTH EXTRACTION    . TOTAL KNEE ARTHROPLASTY Left 09/11/2017   Procedure: LEFT TOTAL KNEE ARTHROPLASTY;  Surgeon: Gaynelle Arabian, MD;  Location: [facility];  Service: Orthopedics;  Laterality: Left;  with block  . US ECHOCARDIOGRAPHY  07/17/2006   EF 55-60%    There were no vitals filed for this visit.  Subjective Assessment - 01/22/19 1705    Subjective  Pt feels she is doing well today  She continues to do her exercises and has not been wearing her compression bra or chip pack     Pertinent History  Patient was diagnosed on 03/08/18 with left grade 1-2 invasive ductal carcinoma breast cancer.It measured 1.5 cm and is located in the upper outer quadrant. It is ER/PR positive and HER2 negative with a Ki67 of < 1%.  She had a left total knee replacement in 10/18 with Dr. Wynelle Link and continues to have pain in her left lateral knee. Her orthopedist reported it is likely due to her  iliotibial band. Patient reports she underwent a left lumpectomy and SNLB (0/4 nodes positive) removed on 04/17/18.. Radiation is now complete     Patient Stated Goals  Reduce breast swelling     Currently in Pain?  No/denies   except knee pain         [facility] PT Assessment - 01/22/19 0001      AROM   Left Shoulder Flexion  145 Degrees    Left Shoulder ABduction  160 Degrees                   [facility] Adult PT Treatment/Exercise - 01/22/19 0001      Manual Therapy   Soft tissue mobilization  with thick massage cream and pt in right sidelying, soft tissue work to tight muscles at posterior and infereior scapula , prolonged pressure at tight trigger point at posterior axilla     Manual Lymphatic Drainage (MLD)  short neck, superficial and deep abdominals. right axially nodes, anterior interaxillary anastamosis, left axilla and axillo inguinal anastamsis, left breast then to sidelying for more work on lateral trunkk   firmness perceived in left breast and chest at [employer] major                 PT Long Term Goals - 01/22/19 [place] #1   Title  Patient will demonstrate she has returned to baseline related to left shoulder ROM and function including WNL values of flexion and abduction without pulling    Baseline  06/20/18- 143 flexion, 112 abduction, 10/24/2018 140 degrees flexion and 170 degrees of abduction of left shoulder , 11/21/2018  155 degrees of flexion with pulling  12/25/2018/ 145 flexion and 160 degrees abduciton but still with pulling and pain 01/22/2019. Pt ROM is the same as last assessment and she still has the pulling in her chest she continues to do stretching and feels that may take time resolve    Status  Partially Met      PT LONG TERM GOAL #2   Title  pt will report a decrease in seroma sensation in the under arm by at least 50%    Baseline  she feels fullness in the breast and axilla that is firm to touch  12/11 2019 decreased, but still  fullness at the scar  12/25/2018, seroma has decreased since she started but she still has pain every day when she moves her arm . 01/22/19 Pt continues tohave pain her breast seroma but the area under her axilla has resolved     Status  Achieved      PT LONG TERM GOAL #3   Title  Pt will be independent with self MLD for the  Lt lateral breast and seroma region    Status  Achieved      PT LONG TERM GOAL #4   Title  Pt will be set up with how to obtain compression garments    Baseline  to be measured for sleeve on 10/29/2018 and for compression bra on 11/05/2018  11/21/2018. pt does not have sleeve yet .  01/22/2019: pt has her compression garment     Status  Achieved      PT LONG TERM GOAL #5   Title  Patient wil verbalize understanding of lymphedema risk reduction practices.    Status  Achieved      PT LONG TERM GOAL #6   Title  sit to stand 5 times </= 13 seconds due to improve strength and endurance    Status  Deferred            Plan - 01/22/19 1707    Clinical Impression Statement  Pt has partially met all her goals and feels that she is ready to be discharged.  She knows what exercises to do and what to do should she feel more fullness in her breast,  She has a prophylactic compression sleeve for flying should she need it. She is ready to be discharged from PT     Rehab Potential  Excellent    Clinical Impairments Affecting Rehab Potential  None    PT Frequency  2x / week    PT Duration  4 weeks    PT Treatment/Interventions  ADLs/Self Care Home Management;Therapeutic activities;Therapeutic exercise;Patient/family education;Manual techniques;Manual lymph drainage;Scar mobilization;Passive range of motion    PT Next Visit Plan  Discharged       Patient will benefit from skilled therapeutic intervention in order to improve the following deficits and impairments:  Decreased knowledge of precautions, Impaired UE functional use, Decreased range of motion, Postural dysfunction,  Decreased scar mobility, Decreased mobility, Pain, Increased fascial restricitons, Increased edema  Visit Diagnosis: Abnormal posture  Localized edema  Stiffness of left shoulder, not elsewhere classified  Pain in left arm  Muscle weakness (generalized)     Problem List Patient Active Problem List   Diagnosis Date Noted  . Genetic testing 04/02/2018  . Family history of breast cancer   . Malignant neoplasm of upper-outer quadrant of left female breast ([place])   . Malignant neoplasm of upper-outer quadrant of left breast in female, estrogen receptor positive ([place]) 03/16/2018  . OA (osteoarthritis) of knee 09/11/2017  . Infrapatellar bursitis of right knee 04/29/2014  . Pneumonia, aspiration ([place]) 12/28/2012  . HYPOTHYROIDISM 11/15/2010  . HYPERLIPIDEMIA 11/15/2010  . DEPRESSION 11/15/2010  . GERD 11/15/2010  . CONSTIPATION 11/15/2010  . GALLSTONES 11/15/2010  . RENAL CYST 11/15/2010  . INSOMNIA UNSPECIFIED 11/15/2010  . COUGH 11/15/2010  . ABDOMINAL PAIN OTHER SPECIFIED SITE 11/15/2010  . TRANSAMINASES, SERUM, ELEVATED 11/15/2010   PHYSICAL THERAPY DISCHARGE SUMMARY  Visits from Start of Care: 29  Current functional level related to goals / functional outcomes: Pt feels that she can manage her symptoms at home    Remaining deficits: As above    Education / Equipment: Self manual lymph draiange, use of compression, home exercise  Plan: Patient agrees to discharge.  Patient goals were partially met. Patient is being discharged due to being pleased with the current functional level.  ?????    Donato Heinz. Owens Shark PT  Norwood Levo 01/22/2019, 5:09 PM  [place], [place], [address] Phone: [phone]  Fax:  [phone]  Name: AUBRYANA VITTORIO MRN: [id number] Date of Birth: August 14, 1946

## 2019-01-24 ENCOUNTER — Telehealth (INDEPENDENT_AMBULATORY_CARE_PROVIDER_SITE_OTHER): Payer: Self-pay | Admitting: Family Medicine

## 2019-01-24 ENCOUNTER — Encounter: Payer: Medicare Other | Admitting: Physical Therapy

## 2019-01-24 DIAGNOSIS — R262 Difficulty in walking, not elsewhere classified: Secondary | ICD-10-CM | POA: Diagnosis not present

## 2019-01-24 DIAGNOSIS — G8929 Other chronic pain: Secondary | ICD-10-CM

## 2019-01-24 DIAGNOSIS — G5782 Other specified mononeuropathies of left lower limb: Secondary | ICD-10-CM | POA: Diagnosis not present

## 2019-01-24 DIAGNOSIS — E039 Hypothyroidism, unspecified: Secondary | ICD-10-CM

## 2019-01-24 DIAGNOSIS — R531 Weakness: Secondary | ICD-10-CM | POA: Diagnosis not present

## 2019-01-24 DIAGNOSIS — M25562 Pain in left knee: Principal | ICD-10-CM

## 2019-01-24 DIAGNOSIS — M25661 Stiffness of right knee, not elsewhere classified: Secondary | ICD-10-CM | POA: Diagnosis not present

## 2019-01-24 NOTE — Telephone Encounter (Signed)
We have now received the bloodwork results from Dr. Virgina Jock. I left a message on the patient's husband's voice mail that Dr. Junius Roads will review the results and we will be back in touch with him or the patient afterward.  Dr. Junius Roads, please see results on your desk.

## 2019-01-24 NOTE — Telephone Encounter (Signed)
I spoke with [name]. We still have not received the labwork results from Dr. Virgina Jock.  He said he will call that office and ask them to go ahead and send [place] those results.  I will hold this message for now, as reminder to look for that fax coming in soon.

## 2019-01-24 NOTE — Telephone Encounter (Signed)
Patient's husband left a message yesterday in regards to feed back on the labwork that was done.  [phone].  Thank you.

## 2019-01-25 ENCOUNTER — Ambulatory Visit (INDEPENDENT_AMBULATORY_CARE_PROVIDER_SITE_OTHER): Payer: Medicare Other

## 2019-01-25 DIAGNOSIS — G8929 Other chronic pain: Secondary | ICD-10-CM

## 2019-01-25 DIAGNOSIS — E039 Hypothyroidism, unspecified: Secondary | ICD-10-CM | POA: Diagnosis not present

## 2019-01-25 DIAGNOSIS — M25562 Pain in left knee: Secondary | ICD-10-CM | POA: Diagnosis not present

## 2019-01-25 NOTE — Progress Notes (Signed)
Bloodwork drawn: uric acid, TPO antibody, ANA, CCP antibody, RF, ESR and CRP per Dr. Junius Roads.

## 2019-01-25 NOTE — Telephone Encounter (Signed)
Labs from December 27, 2018, are not entirely normal.  Thyroid TSH is too low at 0.22 which would suggest overmedication with Synthroid, but surprisingly the T4 level is too low.  Typically T4 level would be too high when the TSH is too low.  I will place orders for additional labs to be drawn.

## 2019-01-25 NOTE — Telephone Encounter (Signed)
Spoke with the patient's husband. Appointment made to come in for bloodwork with me this afternoon. Will give them a copy of the bloodwork from Dr. Virgina Jock at that time.

## 2019-01-25 NOTE — Telephone Encounter (Signed)
Notify pt:  Labs from December 27, 2018, are not entirely normal.  Thyroid TSH is too low at 0.22 which would suggest overmedication with Synthroid, but surprisingly the T4 level is too low.  Typically T4 level would be too high when the TSH is too low.  I will place orders for additional labs to be drawn.

## 2019-01-28 ENCOUNTER — Telehealth (INDEPENDENT_AMBULATORY_CARE_PROVIDER_SITE_OTHER): Payer: Self-pay | Admitting: Family Medicine

## 2019-01-28 DIAGNOSIS — R531 Weakness: Secondary | ICD-10-CM | POA: Diagnosis not present

## 2019-01-28 DIAGNOSIS — M25661 Stiffness of right knee, not elsewhere classified: Secondary | ICD-10-CM | POA: Diagnosis not present

## 2019-01-28 DIAGNOSIS — R262 Difficulty in walking, not elsewhere classified: Secondary | ICD-10-CM | POA: Diagnosis not present

## 2019-01-28 DIAGNOSIS — G5782 Other specified mononeuropathies of left lower limb: Secondary | ICD-10-CM | POA: Diagnosis not present

## 2019-01-28 LAB — URIC ACID: Uric Acid, Serum: 3.5 mg/dL (ref 2.5–7.0)

## 2019-01-28 LAB — RHEUMATOID FACTOR: Rheumatoid fact SerPl-aCnc: <14 IU/mL (ref <14)

## 2019-01-28 LAB — ANA: Anti Nuclear Antibody(ANA): NEGATIVE

## 2019-01-28 LAB — CYCLIC CITRUL PEPTIDE ANTIBODY, IGG: Cyclic Citrullin Peptide Ab: <16 UNITS

## 2019-01-28 LAB — THYROID PEROXIDASE ANTIBODY: Thyroperoxidase Ab SerPl-aCnc: <1 IU/mL (ref <9)

## 2019-01-28 LAB — SEDIMENTATION RATE: Sed Rate: 2 mm/h (ref 0–30)

## 2019-01-28 LAB — C-REACTIVE PROTEIN: CRP: 1.8 mg/L (ref <8.0)

## 2019-01-28 NOTE — Telephone Encounter (Signed)
Left message on the home voice mail to call back regarding bloodwork results.

## 2019-01-28 NOTE — Telephone Encounter (Signed)
Labs are all in normal range.  Nothing to explain her pain based on labs.

## 2019-01-29 NOTE — Telephone Encounter (Signed)
I spoke with both the patient and her husband on speaker phone: lab results given. The pain is about the same. She is still going to PT (iontophoresis treatments, as well). Would like to come in and talk to Dr. Junius Roads more and possibly have the Toradol injection. Appointment scheduled for tomorrow afternoon.

## 2019-01-30 ENCOUNTER — Encounter (INDEPENDENT_AMBULATORY_CARE_PROVIDER_SITE_OTHER): Payer: Self-pay | Admitting: Family Medicine

## 2019-01-30 ENCOUNTER — Ambulatory Visit (INDEPENDENT_AMBULATORY_CARE_PROVIDER_SITE_OTHER): Payer: Medicare Other | Admitting: Family Medicine

## 2019-01-30 DIAGNOSIS — G8929 Other chronic pain: Secondary | ICD-10-CM

## 2019-01-30 DIAGNOSIS — M25562 Pain in left knee: Secondary | ICD-10-CM | POA: Diagnosis not present

## 2019-01-30 MED ORDER — NABUMETONE 750 MG PO TABS: 750.0000 mg | ORAL_TABLET | Freq: Two times a day (BID) | ORAL | 6 refills | Status: DC | PRN

## 2019-01-30 NOTE — Progress Notes (Signed)
Office Visit Note   Patient: [name]           Date of Birth: Feb 06, 1946           MRN: [id number] Visit Date: 01/30/2019 Requested by: Shon Baton, [address] [place], [place] [address] PCP: Shon Baton, MD  Subjective: Chief Complaint  Patient presents with  . Left Knee - Pain    ? Toradol injection    HPI: She is here for follow-up left knee pain.  Seems to be getting worse, pain mostly in the anterior aspect.  She feels like physical therapy is not helping like she hoped it would.  She has tried iontophoresis, cupping, etc.  Recent labs were unremarkable.              ROS: Noncontributory  Objective: Vital Signs: There were no vitals taken for this visit.  Physical Exam:  Left knee: No effusion, no warmth or erythema.  Tender in the midportion of the patella tendon and also a tender band lateral to the patellofemoral joint.  Most of her pain is over the patellar tendon, however.  Imaging: Musculoskeletal ultrasound -midportion of the patella tendon is thickened but I do not see any partial tears.  Assessment & Plan: 1.  Chronic left knee pain status post replacement and arthroscopic debridement -Trial of a patella strap brace.  Stop physical therapy for now, anti-inflammatories as needed.     Procedures: No procedures performed  No notes on file     PMFS History: Patient Active Problem List   Diagnosis Date Noted  . Genetic testing 04/02/2018  . Family history of breast cancer   . Malignant neoplasm of upper-outer quadrant of left female breast ([place])   . Malignant neoplasm of upper-outer quadrant of left breast in female, estrogen receptor positive ([place]) 03/16/2018  . OA (osteoarthritis) of knee 09/11/2017  . Infrapatellar bursitis of right knee 04/29/2014  . Pneumonia, aspiration ([place]) 12/28/2012  . HYPOTHYROIDISM 11/15/2010  . HYPERLIPIDEMIA 11/15/2010  . DEPRESSION 11/15/2010  . GERD 11/15/2010  . CONSTIPATION 11/15/2010  . GALLSTONES  11/15/2010  . RENAL CYST 11/15/2010  . INSOMNIA UNSPECIFIED 11/15/2010  . COUGH 11/15/2010  . ABDOMINAL PAIN OTHER SPECIFIED SITE 11/15/2010  . TRANSAMINASES, SERUM, ELEVATED 11/15/2010   Past Medical History:  Diagnosis Date  . Arthritis    hands and knees  . Complication of anesthesia    aspiration pna; following a colonoscopy, pt requests head of bed elevated if possible.  . Constipation   . Cough   . Depression   . Dysrhythmia    RBBB on 06-06-17 ekg   . Elevated liver function tests   . Esophageal stricture   . Family history of breast cancer   . Gallstones   . Genetic testing 04/02/2018   STAT Breast panel with reflex to Multi-Cancer panel (83 genes) @ [facility] - No pathogenic mutations detected  . GERD (gastroesophageal reflux disease)   . Hiatal hernia   . History of kidney stones   . History of radiation therapy 07/09/2018- 08/03/18   Left Breast/ 40.05 Gy in 15 fractions. Left Breast boost 10 Gy in 5 fractions.   . Hyperlipemia   . Hypothyroidism   . Insomnia   . Malignant neoplasm of upper-outer quadrant of left female breast ([place])   . Nephrolithiasis   . Pneumonia 2015   aspirated after colonosopy  . Renal cyst     Family History  Problem Relation Age of Onset  . Breast cancer  Mother 5       deceased 46; TAH/BSO in 98s/50s  . Dementia Father        deceased 69  . Breast cancer Maternal Aunt 82       deceased 10s  . Colon cancer Neg Hx   . Stomach cancer Neg Hx     Past Surgical History:  Procedure Laterality Date  . BREAST LUMPECTOMY WITH RADIOACTIVE SEED AND SENTINEL LYMPH NODE BIOPSY Left 04/17/2018   Procedure: LEFT BREAST LUMPECTOMY WITH BRACKETED RADIOACTIVE SEEDS AND SENTINEL LYMPH NODE BIOPSY;  Surgeon: Rolm Bookbinder, MD;  Location: [facility];  Service: General;  Laterality: Left;  . BREAST SURGERY Left    Lumpectomy  . CARDIOVASCULAR STRESS TEST  07/19/2006   EF 70%, NO EVIDENCE OF ISCHEMIA  . CHOLECYSTECTOMY  12/30/10  .  COLONOSCOPY    . DILATION AND CURETTAGE OF UTERUS    . HYSTEROSCOPY W/D&C N/A 06/07/2018   Procedure: DILATATION AND CURETTAGE /HYSTEROSCOPY;  Surgeon: [name], MD;  Location: [facility];  Service: Gynecology;  Laterality: N/A;  . inguinal herniography    . NASAL SINUS SURGERY    . RADIOACTIVE SEED GUIDED EXCISIONAL BREAST BIOPSY Right 04/17/2018   Procedure: RIGHT BREAST SEED GUIDED EXCISIONAL BIOPSY;  Surgeon: Rolm Bookbinder, MD;  Location: [facility];  Service: General;  Laterality: Right;  . RE-EXCISION OF BREAST LUMPECTOMY Left 05/15/2018   Procedure: RE-EXCISION OF LEFT BREAST LUMPECTOMY, ASPIRATION OF LEFT AXILLARY SEROMA;  Surgeon: Rolm Bookbinder, MD;  Location: [facility];  Service: General;  Laterality: Left;  . T & A    . TOOTH EXTRACTION    . TOTAL KNEE ARTHROPLASTY Left 09/11/2017   Procedure: LEFT TOTAL KNEE ARTHROPLASTY;  Surgeon: Gaynelle Arabian, MD;  Location[facility];  Service: Orthopedics;  Laterality: Left;  with block  . US ECHOCARDIOGRAPHY  07/17/2006   EF 55-60%   Social History   Occupational History  . 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 Smoking status: Never Smoker  . Smokeless tobacco: Never Used  Substance and Sexual Activity  . Alcohol use: No  . Drug use: No  . Sexual activity: Not on file

## 2019-02-07 ENCOUNTER — Encounter: Payer: Medicare Other | Admitting: Adult Health

## 2019-03-01 ENCOUNTER — Other Ambulatory Visit: Payer: Self-pay | Admitting: *Deleted

## 2019-03-01 DIAGNOSIS — C50412 Malignant neoplasm of upper-outer quadrant of left female breast: Secondary | ICD-10-CM

## 2019-03-01 DIAGNOSIS — Z853 Personal history of malignant neoplasm of breast: Secondary | ICD-10-CM | POA: Diagnosis not present

## 2019-03-01 DIAGNOSIS — N644 Mastodynia: Secondary | ICD-10-CM | POA: Diagnosis not present

## 2019-03-01 DIAGNOSIS — Z170 Estrogen receptor positive status [ER+]: Principal | ICD-10-CM

## 2019-03-04 ENCOUNTER — Telehealth: Payer: Self-pay | Admitting: *Deleted

## 2019-03-04 NOTE — Telephone Encounter (Signed)
Message left on cell number offering to reschedule appointment for 3/24 per Covid 19 recommendations r/t routine visits.

## 2019-03-05 ENCOUNTER — Inpatient Hospital Stay: Payer: Medicare Other

## 2019-03-05 ENCOUNTER — Inpatient Hospital Stay: Payer: Medicare Other | Admitting: Oncology

## 2019-03-07 ENCOUNTER — Other Ambulatory Visit: Payer: Self-pay | Admitting: Radiology

## 2019-03-07 DIAGNOSIS — Z853 Personal history of malignant neoplasm of breast: Secondary | ICD-10-CM

## 2019-04-01 DIAGNOSIS — M859 Disorder of bone density and structure, unspecified: Secondary | ICD-10-CM | POA: Diagnosis not present

## 2019-04-01 DIAGNOSIS — R739 Hyperglycemia, unspecified: Secondary | ICD-10-CM | POA: Diagnosis not present

## 2019-04-01 DIAGNOSIS — E7849 Other hyperlipidemia: Secondary | ICD-10-CM | POA: Diagnosis not present

## 2019-04-01 DIAGNOSIS — E038 Other specified hypothyroidism: Secondary | ICD-10-CM | POA: Diagnosis not present

## 2019-04-03 DIAGNOSIS — R82998 Other abnormal findings in urine: Secondary | ICD-10-CM | POA: Diagnosis not present

## 2019-04-09 DIAGNOSIS — R739 Hyperglycemia, unspecified: Secondary | ICD-10-CM | POA: Diagnosis not present

## 2019-04-09 DIAGNOSIS — H9319 Tinnitus, unspecified ear: Secondary | ICD-10-CM | POA: Diagnosis not present

## 2019-04-09 DIAGNOSIS — K760 Fatty (change of) liver, not elsewhere classified: Secondary | ICD-10-CM | POA: Diagnosis not present

## 2019-04-09 DIAGNOSIS — C50412 Malignant neoplasm of upper-outer quadrant of left female breast: Secondary | ICD-10-CM | POA: Diagnosis not present

## 2019-04-09 DIAGNOSIS — M8580 Other specified disorders of bone density and structure, unspecified site: Secondary | ICD-10-CM | POA: Diagnosis not present

## 2019-04-09 DIAGNOSIS — I700 Atherosclerosis of aorta: Secondary | ICD-10-CM | POA: Diagnosis not present

## 2019-04-09 DIAGNOSIS — R945 Abnormal results of liver function studies: Secondary | ICD-10-CM | POA: Diagnosis not present

## 2019-04-09 DIAGNOSIS — N281 Cyst of kidney, acquired: Secondary | ICD-10-CM | POA: Diagnosis not present

## 2019-04-09 DIAGNOSIS — F325 Major depressive disorder, single episode, in full remission: Secondary | ICD-10-CM | POA: Diagnosis not present

## 2019-04-09 DIAGNOSIS — E785 Hyperlipidemia, unspecified: Secondary | ICD-10-CM | POA: Diagnosis not present

## 2019-04-09 DIAGNOSIS — Z0000 Encounter for general adult medical examination without abnormal findings: Secondary | ICD-10-CM | POA: Diagnosis not present

## 2019-04-09 DIAGNOSIS — Z1339 Encounter for screening examination for other mental health and behavioral disorders: Secondary | ICD-10-CM | POA: Diagnosis not present

## 2019-04-09 DIAGNOSIS — E039 Hypothyroidism, unspecified: Secondary | ICD-10-CM | POA: Diagnosis not present

## 2019-04-09 DIAGNOSIS — Z1331 Encounter for screening for depression: Secondary | ICD-10-CM | POA: Diagnosis not present

## 2019-04-12 ENCOUNTER — Ambulatory Visit: Payer: Medicare Other | Admitting: Cardiology

## 2019-04-12 HISTORY — PX: BREAST BIOPSY: SHX20

## 2019-05-07 ENCOUNTER — Other Ambulatory Visit: Payer: Medicare Other

## 2019-05-09 DIAGNOSIS — L821 Other seborrheic keratosis: Secondary | ICD-10-CM | POA: Diagnosis not present

## 2019-05-09 DIAGNOSIS — L988 Other specified disorders of the skin and subcutaneous tissue: Secondary | ICD-10-CM | POA: Diagnosis not present

## 2019-05-09 DIAGNOSIS — D1801 Hemangioma of skin and subcutaneous tissue: Secondary | ICD-10-CM | POA: Diagnosis not present

## 2019-05-09 DIAGNOSIS — D225 Melanocytic nevi of trunk: Secondary | ICD-10-CM | POA: Diagnosis not present

## 2019-05-09 DIAGNOSIS — Z85828 Personal history of other malignant neoplasm of skin: Secondary | ICD-10-CM | POA: Diagnosis not present

## 2019-05-09 DIAGNOSIS — L814 Other melanin hyperpigmentation: Secondary | ICD-10-CM | POA: Diagnosis not present

## 2019-05-09 DIAGNOSIS — L570 Actinic keratosis: Secondary | ICD-10-CM | POA: Diagnosis not present

## 2019-05-15 ENCOUNTER — Other Ambulatory Visit: Payer: Medicare Other

## 2019-05-23 ENCOUNTER — Telehealth: Payer: Self-pay

## 2019-05-23 NOTE — Telephone Encounter (Signed)
RN spoke with patient regarding concerns about side effects related to Tamoxifen.    Pt reports severe hot flashes to the point that her hair is wet and has to use cool towels to control the hot flashes.  Pt also reports she had "mind fog" that has gotten worse since last appointment with Dr. Doris Cheadle.     Per Dr. Jana Hakim recommendations, discontinue Tamoxifen X 1 month.  Pt to call to report if symptoms have improved, and at that time we will consider switching her to Letrozole.  Pt voiced understanding and agreement.

## 2019-05-27 ENCOUNTER — Ambulatory Visit
Admission: RE | Admit: 2019-05-27 | Discharge: 2019-05-27 | Disposition: A | Discharge status: Home / Self Care | Payer: Medicare Other | Source: Ambulatory Visit | Attending: Radiology | Admitting: Radiology

## 2019-05-27 DIAGNOSIS — Z853 Personal history of malignant neoplasm of breast: Secondary | ICD-10-CM

## 2019-05-27 DIAGNOSIS — D0502 Lobular carcinoma in situ of left breast: Secondary | ICD-10-CM | POA: Diagnosis not present

## 2019-05-27 MED ORDER — GADOBUTROL 1 MMOL/ML IV SOLN
6.0000 mL | Freq: Once | INTRAVENOUS | Status: AC | PRN
  Administered 2019-05-27: 6 mL via INTRAVENOUS

## 2019-05-30 ENCOUNTER — Other Ambulatory Visit: Payer: Self-pay | Admitting: Radiology

## 2019-05-30 DIAGNOSIS — R9389 Abnormal findings on diagnostic imaging of other specified body structures: Secondary | ICD-10-CM

## 2019-05-31 ENCOUNTER — Ambulatory Visit
Admission: RE | Admit: 2019-05-31 | Discharge: 2019-05-31 | Disposition: A | Discharge status: Home / Self Care | Payer: Medicare Other | Source: Ambulatory Visit | Attending: Radiology | Admitting: Radiology

## 2019-05-31 ENCOUNTER — Other Ambulatory Visit: Payer: Self-pay

## 2019-05-31 DIAGNOSIS — R9389 Abnormal findings on diagnostic imaging of other specified body structures: Secondary | ICD-10-CM

## 2019-05-31 DIAGNOSIS — N6032 Fibrosclerosis of left breast: Secondary | ICD-10-CM | POA: Diagnosis not present

## 2019-05-31 DIAGNOSIS — C50412 Malignant neoplasm of upper-outer quadrant of left female breast: Secondary | ICD-10-CM | POA: Diagnosis not present

## 2019-05-31 HISTORY — PX: BREAST BIOPSY: SHX20

## 2019-05-31 MED ORDER — GADOBUTROL 1 MMOL/ML IV SOLN
8.0000 mL | Freq: Once | INTRAVENOUS | Status: AC | PRN
  Administered 2019-05-31: 8 mL via INTRAVENOUS

## 2019-06-05 ENCOUNTER — Other Ambulatory Visit: Payer: Medicare Other

## 2019-06-23 NOTE — Progress Notes (Signed)
[name]  [phone] [phone]     ID: [name] DOB: 03/06/1972  MR#: [id number]  TLX#:[id number]  Patient Care Team: Shon Baton, MD as PCP - General Rolm Bookbinder, MD as Consulting Physician (General Surgery) Magrinat, Virgie Dad, MD as Consulting Physician (Oncology) Eppie Gibson, MD as Attending Physician (Radiation Oncology) Lelon Perla, MD as Consulting Physician (Cardiology) [name], MD as Consulting Physician (Obstetrics and Gynecology) Jacquelynn Cree, PT as Physical Therapist (Physical Therapy) OTHER MD:  CHIEF COMPLAINT: Estrogen receptor positive breast cancer  CURRENT TREATMENT: To start letrozole 08/13/2019   INTERVAL HISTORY: [name] returns today for follow-up of her estrogen receptor positive breast cancer.  Since her last visit, she stopped tamoxifen around 05/22/2019 due to side effects. She experienced intense hot flashes and mental confusion. With the confusion, she reports issues with remembering and recalling things. Since stopping, she reports her mind seems clearer but the hot flashes continue to be intense. She is here today to discuss these and consider switching her to Letrozole.   Since her last visit, she underwent bilateral breast MRI on 05/27/2019 showing: breast composition C; regional clumped non-mass enhancement in the central portion of the left breast is suspicious for malignancy.  She proceeded to biopsy of the left breast area in question on 05/31/2019. Pathology from the procedure ([id number]) showed: 1. Left Breast, UOQ, inferior  - apocrine adenosis; dense fibrosis; chronic inflammation 2. Left Breast, UOQ, superior  - dense fibrosis; chronic inflammation   REVIEW OF SYSTEMS: [name] reports continued left knee issues. She was participating in water aerobics until her knee pain became too bad. She is not exercising now because of this. She states she's tried physical therapy  and seen multiple doctors with no relief. She also reports constipation and breast pains. A detailed review of systems was otherwise noncontributory.     HISTORY OF CURRENT ILLNESS: From the original intake note:  "[name]" had routine diagnostic mammography on 03/08/2018 showing breast density category D.  There was a 1.5 cm irregular mass in the left upper outer quadrant posterior depth.  This was further imaged with ultrasonography on the same day.  The left axilla was sonographically benign.  No abnormalities were seen in the left axilla.  Accordingly on 03/08/2018 she proceeded to biopsy of the left breast area in question. The pathology from this procedure showed ([id number]): Invasive ductal carcinoma, grade I-II. HER-2 negative with the ratio being 1.61 and number per cell 3.30.  The tumor was estrogen receptor 100% positive and progesterone receptor 100% positive, both with strong staining intensity.  Ki67 was <1%.   On 03/18/2018, she underwent a bilateral breast MRI with and without contrast, showing the mass in question in a setting of 3.3 cm non-masslike enhancement in the posterior aspect of the left upper-outer quadrant. in addition there was a 1.1 cm mass in the left upper-outer quadrant located 2.1 cm anterior and lateral to the biopsy proven malignant.   Right breast showed two indeterminate 5 mm adjacent enhancing nodules in the upper-inner quadrant.   The patient's subsequent history is as detailed below.   PAST MEDICAL HISTORY: Past Medical History:  Diagnosis Date   Arthritis    hands and knees   Complication of anesthesia    aspiration pna; following a colonoscopy, pt requests head of bed elevated if possible.   Constipation    Cough    Depression    Dysrhythmia    RBBB on 06-06-17 ekg  Elevated liver function tests    Esophageal stricture    Family history of breast cancer    Gallstones    Genetic testing 04/02/2018   STAT Breast panel with reflex to  Multi-Cancer panel (83 genes) @ [facility] - No pathogenic mutations detected   GERD (gastroesophageal reflux disease)    Hiatal hernia    History of kidney stones    History of radiation therapy 07/09/2018- 08/03/18   Left Breast/ 40.05 Gy in 15 fractions. Left Breast boost 10 Gy in 5 fractions.    Hyperlipemia    Hypothyroidism    Insomnia    Malignant neoplasm of upper-outer quadrant of left female breast ([place])    Nephrolithiasis    Pneumonia 2015   aspirated after colonosopy   Renal cyst     PAST SURGICAL HISTORY: Past Surgical History:  Procedure Laterality Date   BREAST LUMPECTOMY WITH RADIOACTIVE SEED AND SENTINEL LYMPH NODE BIOPSY Left 04/17/2018   Procedure: LEFT BREAST LUMPECTOMY WITH BRACKETED RADIOACTIVE SEEDS AND SENTINEL LYMPH NODE BIOPSY;  Surgeon: Rolm Bookbinder, MD;  Location: [facility];  Service: General;  Laterality: Left;   BREAST SURGERY Left    Lumpectomy   CARDIOVASCULAR STRESS TEST  07/19/2006   EF 70%, NO EVIDENCE OF ISCHEMIA   CHOLECYSTECTOMY  12/30/10   COLONOSCOPY     DILATION AND CURETTAGE OF UTERUS     HYSTEROSCOPY W/D&C N/A 06/07/2018   Procedure: DILATATION AND CURETTAGE /HYSTEROSCOPY;  Surgeon: [name], MD;  Location: [facility];  Service: Gynecology;  Laterality: N/A;   inguinal herniography     NASAL SINUS SURGERY     RADIOACTIVE SEED GUIDED EXCISIONAL BREAST BIOPSY Right 04/17/2018   Procedure: RIGHT BREAST SEED GUIDED EXCISIONAL BIOPSY;  Surgeon: Rolm Bookbinder, MD;  Location: [facility];  Service: General;  Laterality: Right;   RE-EXCISION OF BREAST LUMPECTOMY Left 05/15/2018   Procedure: RE-EXCISION OF LEFT BREAST LUMPECTOMY, ASPIRATION OF LEFT AXILLARY SEROMA;  Surgeon: Rolm Bookbinder, MD;  Location: [facility];  Service: General;  Laterality: Left;   T & A     TOOTH EXTRACTION     TOTAL KNEE ARTHROPLASTY Left 09/11/2017   Procedure: LEFT TOTAL KNEE ARTHROPLASTY;   Surgeon: Gaynelle Arabian, MD;  Location: [facility];  Service: Orthopedics;  Laterality: Left;  with block   US ECHOCARDIOGRAPHY  07/17/2006   EF 55-60%    FAMILY HISTORY Family History  Problem Relation Age of Onset   Breast cancer Mother 14       deceased 51; TAH/BSO in 27s/50s   Dementia Father        deceased 67   Breast cancer Maternal Aunt 82       deceased 7s   Colon cancer Neg Hx    Stomach cancer Neg Hx    Her father died of old age at 61 years old. Her mother had breast cancer at 70 and died at 73 years old. She will undergo genetic testing. Her maternal aunt had breast cancer at 35 years old. No family history of ovarian cancer. She had one brother and one sister.    GYNECOLOGIC HISTORY:  No LMP recorded. Patient is postmenopausal. Menarche: 73 years old Delta P 0 LMP: 1997 Contraceptive: 2 years HRT: Estrogen and progesterone for about 7 years  Hysterectomy? No   SOCIAL HISTORY:  She is a retired Futures trader. She has been married to [place], for 45 plus years. He worked in the [employer]. Together they adopted two children.  One daughter, [name], 25 of [place], who is a Psychologist, sport and exercise for an OBGYN, and one son, Shontay Wallner, 36 of [place], who owns a Marketing executive business. The patient as three grandchildren. She attends [employer].    ADVANCED DIRECTIVES: In place   HEALTH MAINTENANCE: Social History   Tobacco Use   Smoking status: Never Smoker   Smokeless tobacco: Never Used  Substance Use Topics   Alcohol use: No   Drug use: No    Colonoscopy: 2014/ [facility]  PAP: 2018  Bone density:  May 2014 at [facility] showed a T score of -0.9 normal   Allergies  Allergen Reactions   Oxycontin [Oxycodone Hcl]     "it made me feel very high and hyper"    Codeine Nausea And Vomiting    Current Outpatient Medications  Medication Sig Dispense Refill   acetaminophen (TYLENOL) 500 MG tablet Take 500 mg by mouth every 6  (six) hours as needed for mild pain or headache.     azelastine (ASTELIN) 0.1 % nasal spray Place 1 spray into both nostrils 2 (two) times daily. Use in each nostril as directed     Cholecalciferol (VITAMIN D3) 1000 units CAPS Take 1 capsule by mouth 2 (two) times daily.      escitalopram (LEXAPRO) 10 MG tablet Take 15 mg by mouth daily.      esomeprazole (NEXIUM) 40 MG capsule Take 40 mg by mouth 2 (two) times daily before a meal.     ezetimibe (ZETIA) 10 MG tablet Take 10 mg by mouth at bedtime.     fluticasone (FLONASE) 50 MCG/ACT nasal spray Place 1 spray into both nostrils daily.     letrozole (FEMARA) 2.5 MG tablet Take 1 tablet (2.5 mg total) by mouth daily. Start 08/13/2019 90 tablet 4   levothyroxine (SYNTHROID, LEVOTHROID) 50 MCG tablet Take 50 mcg by mouth at bedtime.     Magnesium 250 MG TABS Take 1 tablet by mouth daily.      nabumetone (RELAFEN) 750 MG tablet Take 1 tablet (750 mg total) by mouth 2 (two) times daily as needed. 60 tablet 6   naproxen sodium (ALEVE) 220 MG tablet Take 220 mg by mouth 2 (two) times daily as needed (pain).     OVER THE COUNTER MEDICATION Apply 1 application topically daily as needed (pain). Natural pain relief ointment     Probiotic Product (PROBIOTIC ADVANCED PO) Take 1 tablet by mouth daily.     rosuvastatin (CRESTOR) 5 MG tablet Take 5 mg by mouth at bedtime.      sodium chloride (OCEAN) 0.65 % SOLN nasal spray Place 4 sprays into both nostrils 4 (four) times daily.     SUPER B COMPLEX/C PO Take 1 tablet by mouth daily.     zolpidem (AMBIEN) 10 MG tablet zolpidem 10 mg tablet  take 1 tablet by mouth at bedtime if needed     No current facility-administered medications for this visit.     OBJECTIVE: Middle-aged white woman who appears stated age  44:   06/24/19 1125  BP: 127/63  Pulse: 79  Resp: 18  Temp: 98.9 F (37.2 C)  SpO2: 99%     Body mass index is 24.51 kg/m.   Wt Readings from Last 3 Encounters:  06/24/19  161 lb 3.2 oz (73.1 kg)  11/21/18 163 lb (73.9 kg)  09/07/18 164 lb 9.6 oz (74.7 kg)      ECOG FS:1 - Symptomatic but completely ambulatory  Sclerae  unicteric, EOMs intact Wearing a mask No cervical or supraclavicular adenopathy Lungs no rales or rhonchi Heart regular rate and rhythm Abd soft, nontender, positive bowel sounds MSK no focal spinal tenderness, no upper extremity lymphedema Neuro: nonfocal, well oriented, appropriate affect Breasts: The right breast is status post lumpectomy followed by radiation.  Exam is unchanged, with a firm area superiorly but no evidence of disease recurrence.  The left breast is also "lumpy" and is tender to palpation, with no erythema and no fluctuance.  Both axillae are benign.  LAB RESULTS:  CMP     Component Value Date/Time   NA 143 06/24/2019 1104   NA 144 06/06/2017 1448   K 4.7 06/24/2019 1104   CL 109 06/24/2019 1104   CO2 28 06/24/2019 1104   GLUCOSE 97 06/24/2019 1104   BUN 11 06/24/2019 1104   BUN 12 06/06/2017 1448   CREATININE 0.79 06/24/2019 1104   CALCIUM 9.4 06/24/2019 1104   PROT 6.6 06/24/2019 1104   PROT 6.3 06/06/2017 1448   ALBUMIN 3.9 06/24/2019 1104   ALBUMIN 4.4 06/06/2017 1448   AST 76 (H) 06/24/2019 1104   ALT 58 (H) 06/24/2019 1104   ALKPHOS 73 06/24/2019 1104   BILITOT 0.4 06/24/2019 1104   GFRNONAA >60 06/24/2019 1104   GFRAA >60 06/24/2019 1104    No results found for: TOTALPROTELP, ALBUMINELP, A1GS, A2GS, BETS, BETA2SER, GAMS, MSPIKE, SPEI  No results found for: KPAFRELGTCHN, LAMBDASER, KAPLAMBRATIO  Lab Results  Component Value Date   WBC 6.7 06/24/2019   NEUTROABS 2.6 06/24/2019   HGB 13.2 06/24/2019   HCT 40.8 06/24/2019   MCV 93.6 06/24/2019   PLT 187 06/24/2019    _0 @  No results found for: LABCA2  No components found for: [id number]  No results for input(s): INR in the last 168 hours.  No results found for: LABCA2  No results found for: STM196  No results found  for: QIW979  No results found for: GXQ119  No results found for: CA2729  No components found for: HGQUANT  No results found for: CEA1 / No results found for: CEA1   No results found for: AFPTUMOR  No results found for: CHROMOGRNA  No results found for: PSA1  Appointment on 06/24/2019  Component Date Value Ref Range Status   Sodium 06/24/2019 143  135 - 145 mmol/L Final   Potassium 06/24/2019 4.7  3.5 - 5.1 mmol/L Final   Chloride 06/24/2019 109  98 - 111 mmol/L Final   CO2 06/24/2019 28  22 - 32 mmol/L Final   Glucose, Bld 06/24/2019 97  70 - 99 mg/dL Final   BUN 06/24/2019 11  8 - 23 mg/dL Final   Creatinine 06/24/2019 0.79  0.44 - 1.00 mg/dL Final   Calcium 06/24/2019 9.4  8.9 - 10.3 mg/dL Final   Total Protein 06/24/2019 6.6  6.5 - 8.1 g/dL Final   Albumin 06/24/2019 3.9  3.5 - 5.0 g/dL Final   AST 06/24/2019 76* 15 - 41 U/L Final   ALT 06/24/2019 58* 0 - 44 U/L Final   Alkaline Phosphatase 06/24/2019 73  38 - 126 U/L Final   Total Bilirubin 06/24/2019 0.4  0.3 - 1.2 mg/dL Final   GFR, Est Non Af Am 06/24/2019 >60  >60 mL/min Final   GFR, Est AFR Am 06/24/2019 >60  >60 mL/min Final   Anion gap 06/24/2019 6  5 - 15 Final   Performed at [facility], [place] [address], [place], [place] [address]  WBC Count 06/24/2019 6.7  4.0 - 10.5 K/uL Final   RBC 06/24/2019 4.36  3.87 - 5.11 MIL/uL Final   Hemoglobin 06/24/2019 13.2  12.0 - 15.0 g/dL Final   HCT 06/24/2019 40.8  36.0 - 46.0 % Final   MCV 06/24/2019 93.6  80.0 - 100.0 fL Final   MCH 06/24/2019 30.3  26.0 - 34.0 pg Final   MCHC 06/24/2019 32.4  30.0 - 36.0 g/dL Final   RDW 06/24/2019 13.1  11.5 - 15.5 % Final   Platelet Count 06/24/2019 187  150 - 400 K/uL Final   nRBC 06/24/2019 0.0  0.0 - 0.2 % Final   Neutrophils Relative % 06/24/2019 38  % Final   Neutro Abs 06/24/2019 2.6  1.7 - 7.7 K/uL Final   Lymphocytes Relative 06/24/2019 47  % Final   Lymphs Abs  06/24/2019 3.1  0.7 - 4.0 K/uL Final   Monocytes Relative 06/24/2019 9  % Final   Monocytes Absolute 06/24/2019 0.6  0.1 - 1.0 K/uL Final   Eosinophils Relative 06/24/2019 5  % Final   Eosinophils Absolute 06/24/2019 0.4  0.0 - 0.5 K/uL Final   Basophils Relative 06/24/2019 1  % Final   Basophils Absolute 06/24/2019 0.1  0.0 - 0.1 K/uL Final   Immature Granulocytes 06/24/2019 0  % Final   Abs Immature Granulocytes 06/24/2019 0.01  0.00 - 0.07 K/uL Final   Performed at [facility], [address] [place], [place] [address]    (this displays the last labs from the last 3 days)  No results found for: TOTALPROTELP, ALBUMINELP, A1GS, A2GS, BETS, BETA2SER, GAMS, MSPIKE, SPEI (this displays SPEP labs)  No results found for: KPAFRELGTCHN, LAMBDASER, KAPLAMBRATIO (kappa/lambda light chains)  No results found for: HGBA, HGBA2QUANT, HGBFQUANT, HGBSQUAN (Hemoglobinopathy evaluation)   No results found for: LDH  No results found for: IRON, TIBC, IRONPCTSAT (Iron and TIBC)  No results found for: FERRITIN  Urinalysis    Component Value Date/Time   COLORURINE YELLOW 08/22/2016 1947   APPEARANCEUR CLEAR 08/22/2016 1947   LABSPEC 1.026 08/22/2016 1947   PHURINE 5.5 08/22/2016 1947   GLUCOSEU NEGATIVE 08/22/2016 1947   HGBUR SMALL (A) 08/22/2016 1947   BILIRUBINUR NEGATIVE 08/22/2016 1947   KETONESUR NEGATIVE 08/22/2016 1947   PROTEINUR NEGATIVE 08/22/2016 1947   UROBILINOGEN 0.2 08/22/2016 1622   NITRITE NEGATIVE 08/22/2016 1947   LEUKOCYTESUR SMALL (A) 08/22/2016 1947     STUDIES: Mr Breast Bilateral W Wo Contrast Inc Cad  Result Date: 05/27/2019 CLINICAL DATA:  Patient has a personal history of LEFT breast cancer. At pathology, patient had multifocal invasive lobular carcinoma, grade 2, largest spanning 1.5 centimeters and lobular carcinoma in situ. Initial margins were positive laterally, medially, and posteriorly. Sentinel lymph node biopsy was  negative. On re-excision, LATERAL and MEDIAL margins were uninvolved by carcinoma. Atypical lobular hyperplasia was identified at the posterior margin. At the time of lumpectomy, patient had excision of atypical lobular hyperplasia in the RIGHT breast. The patient has undergone radiation therapy of the LEFT breast. The patient's mother was diagnosed with breast cancer at age 68. The patient's paternal aunt was diagnosed in her 16s. LABS:  Creatinine was obtained on site at [facility] at [address] Results: Creatinine 0.8 mg/dL. GFR 74. EXAM: BILATERAL BREAST MRI WITH AND WITHOUT CONTRAST TECHNIQUE: Multiplanar, multisequence MR images of both breasts were obtained prior to and following the intravenous administration of 6 ml of Gadavist Three-dimensional MR images were rendered by post-processing  of the original MR data on an independent workstation. The three-dimensional MR images were interpreted, and findings are reported in the following complete MRI report for this study. Three dimensional images were evaluated at the independent DynaCad workstation COMPARISON:  03/01/2019 and MRI 03/18/2017 FINDINGS: Breast composition: c. Heterogeneous fibroglandular tissue. Background parenchymal enhancement: Moderate. Right breast: No mass or abnormal enhancement. Left breast: There is skin and trabecular thickening in the LEFT breast. Postoperative changes are seen in the LATERAL portion of the LEFT breast. There is a regional area of clumped non mass enhancement in the central portion of the LEFT breast, measuring 4.1 x 4.6 x 3.4 centimeters. This area shows persistent type enhancement kinetics. Lymph nodes: No abnormal appearing lymph nodes. Ancillary findings:  None. IMPRESSION: Regional clumped non mass enhancement in the central portion of the LEFT breast is suspicious for malignancy. RIGHT breast is negative. RECOMMENDATION: Recommend MR guided core biopsy of LEFT breast enhancement. Consider biopsy  of 2 sites for definitive diagnosis/extent. BI-RADS CATEGORY  4: Suspicious. Electronically Signed   By: Nolon Nations M.D.   On: 05/27/2019 16:03   Mm Clip Placement Left  Result Date: 05/31/2019 CLINICAL DATA:  Status post MR guided core biopsy of 2 components of abnormal enhancement in the central portion of the LEFT breast. EXAM: DIAGNOSTIC LEFT MAMMOGRAM POST MRI BIOPSY of 2 sites in the LEFT breast COMPARISON:  Previous exam(s). FINDINGS: Mammographic images were obtained following MRI guided biopsy of abnormal enhancement in the UPPER-OUTER QUADRANT of the LEFT breast, inferior component and placement of a barbell shaped clip. The clip is in expected location. Following biopsy of abnormal enhancement in the UPPER-OUTER QUADRANT of the LEFT breast, superior component and placement of a cylinder-shaped clip, the clip is in expected location. IMPRESSION: Tissue marker clips are in the expected locations after biopsy. Final Assessment: Post Procedure Mammograms for Marker Placement Electronically Signed   By: Nolon Nations M.D.   On: 05/31/2019 10:35   Mr Aundra Millet Breast Bx Johnella Moloney Dev 1st Lesion Image Bx Spec Mr Guide  Addendum Date: 06/04/2019   ADDENDUM REPORT: 06/04/2019 14:53 ADDENDUM: PATHOLOGY ADDENDUM: Pathology: UPPER OUTER QUADRANT of the LEFT breast, inferior location revealed APOCRINE ADENOSIS. DENSE FIBROSIS. CHRONIC INFLAMMATION. Pathology of the UPPER OUTER QUADRANT of the LEFT breast, superior location revealed DENSE FIBROSIS. CHRONIC INFLAMMATION. Pathology concordant with imaging findings: Yes by Dr. Nolon Nations. Recommendation: Six month follow up bilateral breast MRI without and with contrast At the request of the patient, results and recommendations were relayed to the patient and her husband by telephone on 06/04/2019 at 1200 by Jetta Lout, [place]. She reports doing well after the biopsy with bruising and tenderness but no significant bleeding. The patient voiced concerns of possible  lymph edema and was offered to come to [facility] for the nurses or radiologist to check the patient's breast and arm. The patient declined to come to the office but stated she would use the ice packs. Post biopsy instructions were reviewed and all of their questions were answered. She was encouraged to contact the nurse navigators or Jetta Lout, [place] at the [facility] [place] with any further questions or concerns. Addendum by Jetta Lout, RRA on 06/04/2019. Electronically Signed   By: Nolon Nations M.D.   On: 06/04/2019 14:53   Result Date: 06/04/2019 CLINICAL DATA:  The patient presents for MR guided core biopsy of abnormal enhancement in the central portion of the LEFT breast. History of LEFT lumpectomy for multifocal invasive  lobular carcinoma followed by radiation therapy. EXAM: MRI GUIDED CORE NEEDLE BIOPSY OF THE LEFT BREAST x2 TECHNIQUE: Multiplanar, multisequence MR imaging of the LEFT breast was performed both before and after administration of intravenous contrast. CONTRAST:  8 ml Gadavist COMPARISON:  05/27/2019 FINDINGS: I met with the patient, and we discussed the procedure of MRI guided biopsy, including risks, benefits, and alternatives. Specifically, we discussed the risks of infection, bleeding, tissue injury, clip migration, and inadequate sampling. Informed, written consent was given. The usual time out protocol was performed immediately prior to the procedure. Site 1, UPPER-OUTER QUADRANT LEFT breast, inferior: Using sterile technique, 1% Lidocaine, MRI guidance, and a 9 gauge vacuum assisted device, biopsy was performed of UPPER OUTER QUADRANT of the LEFT breast, inferior location using a LATERAL to MEDIAL approach. At the conclusion of the procedure, a barbell shaped tissue marker clip was deployed into the biopsy cavity. Follow-up 2-view mammogram was performed and dictated separately. Site 2, UPPER OUTER QUADRANT LEFT breast, superior: Using sterile technique,  1% Lidocaine, MRI guidance, and a 9 gauge vacuum assisted device, biopsy was performed of LATERAL to MEDIAL using a LATERAL to be approach. At the conclusion of the procedure, a cylinder shaped tissue marker clip was deployed into the biopsy cavity. Follow-up 2-view mammogram was performed and dictated separately. IMPRESSION: MRI guided biopsy of superior and inferior aspects of abnormal enhancement in the UPPER portion of the LEFT breast. No apparent complications. Electronically Signed: By: Nolon Nations M.D. On: 05/31/2019 10:30   Mr Aundra Millet Breast Bx W Loc Dev Ea Add Lesion Image Bx Spec Mr Guide  Addendum Date: 06/04/2019   ADDENDUM REPORT: 06/04/2019 14:53 ADDENDUM: PATHOLOGY ADDENDUM: Pathology: UPPER OUTER QUADRANT of the LEFT breast, inferior location revealed APOCRINE ADENOSIS. DENSE FIBROSIS. CHRONIC INFLAMMATION. Pathology of the UPPER OUTER QUADRANT of the LEFT breast, superior location revealed DENSE FIBROSIS. CHRONIC INFLAMMATION. Pathology concordant with imaging findings: Yes by Dr. Nolon Nations. Recommendation: Six month follow up bilateral breast MRI without and with contrast At the request of the patient, results and recommendations were relayed to the patient and her husband by telephone on 06/04/2019 at 1200 by Jetta Lout, [place]. She reports doing well after the biopsy with bruising and tenderness but no significant bleeding. The patient voiced concerns of possible lymph edema and was offered to come [facility] for the nurses or radiologist to check the patient's breast and arm. The patient declined to come to the office but stated she would use the ice packs. Post biopsy instructions were reviewed and all of their questions were answered. She was encouraged to contact the nurse navigators or Jetta Lout, [place] at the [facility] [place] with any further questions or concerns. Addendum by Jetta Lout, RRA on 06/04/2019. Electronically Signed   By: Nolon Nations  M.D.   On: 06/04/2019 14:53   Result Date: 06/04/2019 CLINICAL DATA:  The patient presents for MR guided core biopsy of abnormal enhancement in the central portion of the LEFT breast. History of LEFT lumpectomy for multifocal invasive lobular carcinoma followed by radiation therapy. EXAM: MRI GUIDED CORE NEEDLE BIOPSY OF THE LEFT BREAST x2 TECHNIQUE: Multiplanar, multisequence MR imaging of the LEFT breast was performed both before and after administration of intravenous contrast. CONTRAST:  8 ml Gadavist COMPARISON:  05/27/2019 FINDINGS: I met with the patient, and we discussed the procedure of MRI guided biopsy, including risks, benefits, and alternatives. Specifically, we discussed the risks of infection, bleeding, tissue injury, clip migration, and  inadequate sampling. Informed, written consent was given. The usual time out protocol was performed immediately prior to the procedure. Site 1, UPPER-OUTER QUADRANT LEFT breast, inferior: Using sterile technique, 1% Lidocaine, MRI guidance, and a 9 gauge vacuum assisted device, biopsy was performed of UPPER OUTER QUADRANT of the LEFT breast, inferior location using a LATERAL to MEDIAL approach. At the conclusion of the procedure, a barbell shaped tissue marker clip was deployed into the biopsy cavity. Follow-up 2-view mammogram was performed and dictated separately. Site 2, UPPER OUTER QUADRANT LEFT breast, superior: Using sterile technique, 1% Lidocaine, MRI guidance, and a 9 gauge vacuum assisted device, biopsy was performed of LATERAL to MEDIAL using a LATERAL to be approach. At the conclusion of the procedure, a cylinder shaped tissue marker clip was deployed into the biopsy cavity. Follow-up 2-view mammogram was performed and dictated separately. IMPRESSION: MRI guided biopsy of superior and inferior aspects of abnormal enhancement in the UPPER portion of the LEFT breast. No apparent complications. Electronically Signed: By: Nolon Nations M.D. On: 05/31/2019  10:30     ASSESSMENT: 73 y.o. [place] woman status post left breast upper outer quadrant biopsy 03/08/2018 for a clinical T1c-T2 N0, stage I invasive ductal carcinoma, grade 1 or 2, estrogen and progesterone receptor positive, HER-2 not amplified, with an MIB-1 of less than 1%  (1) additional biopsies as follows:  (a) right breast biopsy ([id number]) on 04/02/2018 showed Atypical lobular hyperplasia  (b) left breast ([id number]) on 03/22/2018 showed invasive lobular carcinoma, grade 2, estrogen and progesterone receptor positive with an MIB-1 of 5%. HER-2 not amplified  (2) bilateral lumpectomies and left sentinel lymph node sampling 04/17/2018 found  (a) on the right side, atypical lobular hyperplasia  (b) on the left side, an mpT1c pN0, stage IA, grade 2 invasive lobular carcinoma, with positive margins  (c) additional surgery on 05/15/2018 cleared the compromise margins  (3) The Oncotype DX score was 14, predicting a risk of outside the breast recurrence over the next 9 years of 4% if the patient's only systemic therapy is tamoxifen for 5 years.  It also predicts no significant benefit from chemotherapy.  (4) adjuvant radiation 07/09/2018 - 08/03/2018 Site/dose:    1. Left Breast / 40.05 Gy in 15 fractions 2. Left Breast Boost / 10 Gy in 5 fractions  (5) started tamoxifen 09/11/2018, discontinued June 2020 with cognitive issues  (a) bone density May 2014 normal, T score -0.9  (6) genetics testing 03/22/2018 through [facility] STAT Breast panel with reflex to Multi-Cancer panel found no pathogenic mutations in (ALK, APC, ATM, AXIN2, BAP1, BARD1, BLM, BMPR1A, BRCA1, BRCA2, BRIP1, CASR, CDC73, CDH1, CDK4, CDKN1B, CDKN1C, CDKN2A, CEBPA, CHEK2, CTNNA1, DICER1, DIS3L2, EGFR, EPCAM, FH, FLCN, GATA2, GPC3, GREM1, HOXB13, HRAS, KIT, MAX, MEN1, MET, MITF, MLH1, MSH2, MSH3, MSH6, MUTYH, NBN, NF1, NF2, NTHL1, PALB2, PDGFRA, PHOX2B, PMS2, POLD1, POLE, POT1, PRKAR1A, PTCH1, PTEN, RAD50, RAD51C,  RAD51D, RB1, RECQL4, RET, RUNX1, SDHA, SDHAF2, SDHB, SDHC, SDHD, SMAD4, SMARCA4, SMARCB1, SMARCE1, STK11, SUFU, TERC, TERT, TMEM127, TP53, TSC1, TSC2, VHL, WRN, WT1).  (a) Variants of Uncertain Significance were found in in PDGFRA and RECQL4.  (7) to start letrozole 08/13/2019   pLAN: [name] gave tamoxifen a good try.  She found that she was very confused and could not remember "anything" and now that she has been off about 3 weeks she is already feeling a little bit more clear in her head she says.  She still is having significant hot flashes.  Tamoxifen takes a while to leach out  of the body so she really will not know for another month or so whether the hot flashes are due to tamoxifen or just to menopause.  I have asked her to give [place] a call late August letting us know how that is going.  I have offered her letrozole and we have discussed the possible toxicities side effects and complications of that agent.  Target date to start is August 13, 2019.  She will see me a couple months later to make sure she is tolerating it well.  She is having significant discomfort in the left breast.  This was recently biopsied and was benign.  She was trying to see Dr. Donne Hazel to see if he had any further insight on this and I have sent him a note with her request  She cannot exercise because of her left knee pain, which does not even let her do water aerobics.  This is a real concern  She has persistently mildly elevated liver function tests are very likely due to fatty liver.  These are stable.  She knows to call for any other issue that may develop before the next visit.   Cheyrl Buley, Virgie Dad, MD  06/24/19 12:12 PM Medical Oncology and Hematology [facility] [address] [place], [place] [address] Tel. [phone]    Fax. [phone]   I, Wilburn Mylar, am acting as scribe for Dr. Virgie Dad. Freddie Nghiem.  I, Lurline Del MD, have reviewed the above documentation for  accuracy and completeness, and I agree with the above.

## 2019-06-24 ENCOUNTER — Inpatient Hospital Stay: Payer: Medicare Other | Attending: Oncology

## 2019-06-24 ENCOUNTER — Inpatient Hospital Stay (HOSPITAL_BASED_OUTPATIENT_CLINIC_OR_DEPARTMENT_OTHER): Payer: Medicare Other | Admitting: Oncology

## 2019-06-24 ENCOUNTER — Other Ambulatory Visit: Payer: Self-pay

## 2019-06-24 VITALS — BP 127/63 | HR 79 | Temp 98.9°F | Resp 18 | Wt 161.2 lb

## 2019-06-24 DIAGNOSIS — Z79899 Other long term (current) drug therapy: Secondary | ICD-10-CM | POA: Diagnosis not present

## 2019-06-24 DIAGNOSIS — Z170 Estrogen receptor positive status [ER+]: Secondary | ICD-10-CM

## 2019-06-24 DIAGNOSIS — C50412 Malignant neoplasm of upper-outer quadrant of left female breast: Secondary | ICD-10-CM

## 2019-06-24 DIAGNOSIS — Z923 Personal history of irradiation: Secondary | ICD-10-CM | POA: Insufficient documentation

## 2019-06-24 LAB — CBC WITH DIFFERENTIAL (CANCER CENTER ONLY)
Abs Immature Granulocytes: 0.01 10*3/uL (ref 0.00–0.07)
Basophils Absolute: 0.1 10*3/uL (ref 0.0–0.1)
Basophils Relative: 1 %
Eosinophils Absolute: 0.4 10*3/uL (ref 0.0–0.5)
Eosinophils Relative: 5 %
HCT: 40.8 % (ref 36.0–46.0)
Hemoglobin: 13.2 g/dL (ref 12.0–15.0)
Immature Granulocytes: 0 %
Lymphocytes Relative: 47 %
Lymphs Abs: 3.1 10*3/uL (ref 0.7–4.0)
MCH: 30.3 pg (ref 26.0–34.0)
MCHC: 32.4 g/dL (ref 30.0–36.0)
MCV: 93.6 fL (ref 80.0–100.0)
Monocytes Absolute: 0.6 10*3/uL (ref 0.1–1.0)
Monocytes Relative: 9 %
Neutro Abs: 2.6 10*3/uL (ref 1.7–7.7)
Neutrophils Relative %: 38 %
Platelet Count: 187 10*3/uL (ref 150–400)
RBC: 4.36 MIL/uL (ref 3.87–5.11)
RDW: 13.1 % (ref 11.5–15.5)
WBC Count: 6.7 10*3/uL (ref 4.0–10.5)
nRBC: 0 % (ref 0.0–0.2)

## 2019-06-24 LAB — CMP (CANCER CENTER ONLY)
ALT: 58 U/L — ABNORMAL HIGH (ref 0–44)
AST: 76 U/L — ABNORMAL HIGH (ref 15–41)
Albumin: 3.9 g/dL (ref 3.5–5.0)
Alkaline Phosphatase: 73 U/L (ref 38–126)
Anion gap: 6 (ref 5–15)
BUN: 11 mg/dL (ref 8–23)
CO2: 28 mmol/L (ref 22–32)
Calcium: 9.4 mg/dL (ref 8.9–10.3)
Chloride: 109 mmol/L (ref 98–111)
Creatinine: 0.79 mg/dL (ref 0.44–1.00)
GFR, Est AFR Am: >60 mL/min (ref >60)
GFR, Estimated: >60 mL/min (ref >60)
Glucose, Bld: 97 mg/dL (ref 70–99)
Potassium: 4.7 mmol/L (ref 3.5–5.1)
Sodium: 143 mmol/L (ref 135–145)
Total Bilirubin: 0.4 mg/dL (ref 0.3–1.2)
Total Protein: 6.6 g/dL (ref 6.5–8.1)

## 2019-06-24 MED ORDER — LETROZOLE 2.5 MG PO TABS: 2.5000 mg | ORAL_TABLET | Freq: Every day | ORAL | 4 refills | Status: DC

## 2019-06-25 ENCOUNTER — Inpatient Hospital Stay: Payer: Medicare Other

## 2019-06-25 ENCOUNTER — Telehealth: Payer: Self-pay | Admitting: Oncology

## 2019-06-25 ENCOUNTER — Inpatient Hospital Stay: Payer: Medicare Other | Admitting: Oncology

## 2019-06-25 NOTE — Telephone Encounter (Signed)
I talk with patient regarding schedule  

## 2019-07-11 DIAGNOSIS — H25813 Combined forms of age-related cataract, bilateral: Secondary | ICD-10-CM | POA: Diagnosis not present

## 2019-07-11 DIAGNOSIS — H524 Presbyopia: Secondary | ICD-10-CM | POA: Diagnosis not present

## 2019-07-11 DIAGNOSIS — H52223 Regular astigmatism, bilateral: Secondary | ICD-10-CM | POA: Diagnosis not present

## 2019-07-11 DIAGNOSIS — H5213 Myopia, bilateral: Secondary | ICD-10-CM | POA: Diagnosis not present

## 2019-07-11 DIAGNOSIS — H04123 Dry eye syndrome of bilateral lacrimal glands: Secondary | ICD-10-CM | POA: Diagnosis not present

## 2019-07-12 ENCOUNTER — Telehealth: Payer: Self-pay

## 2019-07-12 DIAGNOSIS — C50412 Malignant neoplasm of upper-outer quadrant of left female breast: Secondary | ICD-10-CM | POA: Diagnosis not present

## 2019-07-12 NOTE — Telephone Encounter (Signed)
Called the patient and left a detailed message on her mobile number and the home number for her and her husband about switching her upcoming appointment to a different day and doing a virtual video or telephone visit with Dr. Stanford Breed.

## 2019-07-24 ENCOUNTER — Ambulatory Visit: Payer: Medicare Other | Admitting: Cardiology

## 2019-08-02 DIAGNOSIS — Z96652 Presence of left artificial knee joint: Secondary | ICD-10-CM | POA: Diagnosis not present

## 2019-08-02 DIAGNOSIS — Z471 Aftercare following joint replacement surgery: Secondary | ICD-10-CM | POA: Diagnosis not present

## 2019-08-05 ENCOUNTER — Other Ambulatory Visit: Payer: Self-pay | Admitting: Orthopedic Surgery

## 2019-08-05 DIAGNOSIS — Z96652 Presence of left artificial knee joint: Secondary | ICD-10-CM

## 2019-08-07 DIAGNOSIS — Z96652 Presence of left artificial knee joint: Secondary | ICD-10-CM | POA: Diagnosis not present

## 2019-08-08 ENCOUNTER — Other Ambulatory Visit: Payer: Medicare Other

## 2019-08-08 ENCOUNTER — Ambulatory Visit: Payer: Medicare Other | Admitting: Oncology

## 2019-08-12 ENCOUNTER — Encounter (HOSPITAL_COMMUNITY)
Admission: RE | Admit: 2019-08-12 | Discharge: 2019-08-12 | Disposition: A | Discharge status: Home / Self Care | Payer: Medicare Other | Source: Ambulatory Visit | Attending: Orthopedic Surgery | Admitting: Orthopedic Surgery

## 2019-08-12 ENCOUNTER — Other Ambulatory Visit: Payer: Self-pay

## 2019-08-12 DIAGNOSIS — Z96652 Presence of left artificial knee joint: Secondary | ICD-10-CM

## 2019-08-12 DIAGNOSIS — Z471 Aftercare following joint replacement surgery: Secondary | ICD-10-CM | POA: Diagnosis not present

## 2019-08-12 MED ORDER — TECHNETIUM TC 99M MEDRONATE IV KIT
22.0000 | PACK | Freq: Once | INTRAVENOUS | Status: AC | PRN
  Administered 2019-08-12: 22 via INTRAVENOUS

## 2019-08-13 NOTE — Progress Notes (Signed)
HPI: FU dizziness. Echocardiogram December 2017 showed normal LV systolic function and grade 1 diastolic dysfunction. Orthostatic symptoms at previous office visit. Nuclear study June 2018 showed ejection fraction 64% and no ischemia. Carotid Dopplers July 2018 showed 1-39% left stenosis. Since last seen,  there is no dyspnea, chest pain, palpitations or syncope.  Some dizziness with standing.  Current Outpatient Medications  Medication Sig Dispense Refill  . acetaminophen (TYLENOL) 500 MG tablet Take 500 mg by mouth every 6 (six) hours as needed for mild pain or headache.    Marland Kitchen azelastine (ASTELIN) 0.1 % nasal spray Place 1 spray into both nostrils 2 (two) times daily. Use in each nostril as directed    . BIOTIN PO Take 1 tablet by mouth daily.    . Cholecalciferol (VITAMIN D3) 1000 units CAPS Take 1 capsule by mouth 2 (two) times daily.     Marland Kitchen escitalopram (LEXAPRO) 10 MG tablet Take 15 mg by mouth daily.     Marland Kitchen esomeprazole (NEXIUM) 40 MG capsule Take 40 mg by mouth 2 (two) times daily before a meal.    . ezetimibe (ZETIA) 10 MG tablet Take 10 mg by mouth at bedtime.    . fluticasone (FLONASE) 50 MCG/ACT nasal spray Place 1 spray into both nostrils daily.    Marland Kitchen letrozole (FEMARA) 2.5 MG tablet Take 1 tablet (2.5 mg total) by mouth daily. Start 08/13/2019 90 tablet 4  . levothyroxine (SYNTHROID, LEVOTHROID) 50 MCG tablet Take 50 mcg by mouth at bedtime.    . Magnesium 250 MG TABS Take 1 tablet by mouth daily.     . naproxen sodium (ALEVE) 220 MG tablet Take 220 mg by mouth 2 (two) times daily as needed (pain).    Marland Kitchen OVER THE COUNTER MEDICATION Apply 1 application topically daily as needed (pain). Natural pain relief ointment    . Probiotic Product (PROBIOTIC ADVANCED PO) Take 1 tablet by mouth daily.    . rosuvastatin (CRESTOR) 5 MG tablet Take 5 mg by mouth at bedtime.     . sodium chloride (OCEAN) 0.65 % SOLN nasal spray Place 4 sprays into both nostrils 4 (four) times daily.    . SUPER  B COMPLEX/C PO Take 1 tablet by mouth daily.    Marland Kitchen zolpidem (AMBIEN) 10 MG tablet zolpidem 10 mg tablet  take 1 tablet by mouth at bedtime if needed     No current facility-administered medications for this visit.      Past Medical History:  Diagnosis Date  . Arthritis    hands and knees  . Complication of anesthesia    aspiration pna; following a colonoscopy, pt requests head of bed elevated if possible.  . Constipation   . Cough   . Depression   . Dysrhythmia    RBBB on 06-06-17 ekg   . Elevated liver function tests   . Esophageal stricture   . Family history of breast cancer   . Gallstones   . Genetic testing 04/02/2018   STAT Breast panel with reflex to Multi-Cancer panel (83 genes) @ [facility] - No pathogenic mutations detected  . GERD (gastroesophageal reflux disease)   . Hiatal hernia   . History of kidney stones   . History of radiation therapy 07/09/2018- 08/03/18   Left Breast/ 40.05 Gy in 15 fractions. Left Breast boost 10 Gy in 5 fractions.   . Hyperlipemia   . Hypothyroidism   . Insomnia   . Malignant neoplasm of upper-outer quadrant of left female  breast ([facility] [place])   . Nephrolithiasis   . Pneumonia 2015   aspirated after colonosopy  . Renal cyst     Past Surgical History:  Procedure Laterality Date  . BREAST LUMPECTOMY WITH RADIOACTIVE SEED AND SENTINEL LYMPH NODE BIOPSY Left 04/17/2018   Procedure: LEFT BREAST LUMPECTOMY WITH BRACKETED RADIOACTIVE SEEDS AND SENTINEL LYMPH NODE BIOPSY;  Surgeon: Rolm Bookbinder, MD;  Location: [facility];  Service: General;  Laterality: Left;  . BREAST SURGERY Left    Lumpectomy  . CARDIOVASCULAR STRESS TEST  07/19/2006   EF 70%, NO EVIDENCE OF ISCHEMIA  . CHOLECYSTECTOMY  12/30/10  . COLONOSCOPY    . DILATION AND CURETTAGE OF UTERUS    . HYSTEROSCOPY W/D&C N/A 06/07/2018   Procedure: DILATATION AND CURETTAGE /HYSTEROSCOPY;  Surgeon: [name], MD;  Location: [facility];  Service: Gynecology;  Laterality: N/A;  .  inguinal herniography    . NASAL SINUS SURGERY    . RADIOACTIVE SEED GUIDED EXCISIONAL BREAST BIOPSY Right 04/17/2018   Procedure: RIGHT BREAST SEED GUIDED EXCISIONAL BIOPSY;  Surgeon: Rolm Bookbinder, MD;  Location: [facility];  Service: General;  Laterality: Right;  . RE-EXCISION OF BREAST LUMPECTOMY Left 05/15/2018   Procedure: RE-EXCISION OF LEFT BREAST LUMPECTOMY, ASPIRATION OF LEFT AXILLARY SEROMA;  Surgeon: Rolm Bookbinder, MD;  Location: [facility];  Service: General;  Laterality: Left;  . T & A    . TOOTH EXTRACTION    . TOTAL KNEE ARTHROPLASTY Left 09/11/2017   Procedure: LEFT TOTAL KNEE ARTHROPLASTY;  Surgeon: Gaynelle Arabian, MD;  Location: [facility];  Service: Orthopedics;  Laterality: Left;  with block  . US ECHOCARDIOGRAPHY  07/17/2006   EF 55-60%    Social History   Socioeconomic History  . Marital status: Married    Spouse name: Not on file  . Number of children: 2  . Years of education: Not on file  . Highest education level: Not on file  Occupational History  . Occupation: Retired    Fish farm manager: [employer]  Social Needs  . Financial resource strain: Not on file  . Food insecurity    Worry: Not on file    Inability: Not on file  . Transportation needs    Medical: No    Non-medical: No  Tobacco Use  . Smoking status: Never Smoker  . Smokeless tobacco: Never Used  Substance and Sexual Activity  . Alcohol use: No  . Drug use: No  . Sexual activity: Not on file  Lifestyle  . Physical activity    Days per week: Not on file    Minutes per session: Not on file  . Stress: Not on file  Relationships  . Social Herbalist on phone: Not on file    Gets together: Not on file    Attends religious service: Not on file    Active member of club or organization: Not on file    Attends meetings of clubs or organizations: Not on file    Relationship status: Not on file  . Intimate partner violence    Fear of current or ex partner: No     Emotionally abused: No    Physically abused: No    Forced sexual activity: No  Other Topics Concern  . Not on file  Social History Narrative  . Not on file    Family History  Problem Relation Age of Onset  . Breast cancer Mother 54       deceased 33; TAH/BSO  in 40s/50s  . Dementia Father        deceased 3  . Breast cancer Maternal Aunt 82       deceased 51s  . Colon cancer Neg Hx   . Stomach cancer Neg Hx     ROS: no fevers or chills, productive cough, hemoptysis, dysphasia, odynophagia, melena, hematochezia, dysuria, hematuria, rash, seizure activity, orthopnea, PND, pedal edema, claudication. Remaining systems are negative.  Physical Exam: Well-developed well-nourished in no acute distress.  Skin is warm and dry.  HEENT is normal.  Neck is supple.  Chest is clear to auscultation with normal expansion.  Cardiovascular exam is regular rate and rhythm.  Abdominal exam nontender or distended. No masses palpated. Extremities show no edema. neuro grossly intact  ECG-sinus rhythm at a rate of 95, incomplete right bundle branch block, anterior and inferior infarct.  Personally reviewed  A/P  1 orthostatic dizziness-patient seems to be doing well from a symptomatic standpoint.  We will continue to stress increased fluid intake and sodium intake.  2 hyperlipidemia-continue present medications.  Followed by primary care.  3 history of chest pain-no recent symptoms.  Previous nuclear study negative.  Note previous ECG with question prior anterior and inferior infarct but LV function normal on echocardiogram and nuclear study showed normal perfusion.  Kirk Ruths, MD

## 2019-08-14 ENCOUNTER — Other Ambulatory Visit: Payer: Self-pay

## 2019-08-14 ENCOUNTER — Ambulatory Visit (INDEPENDENT_AMBULATORY_CARE_PROVIDER_SITE_OTHER): Payer: Medicare Other | Admitting: Cardiology

## 2019-08-14 ENCOUNTER — Encounter: Payer: Self-pay | Admitting: Cardiology

## 2019-08-14 VITALS — BP 126/70 | HR 95 | Temp 96.6°F | Ht 68.0 in | Wt 161.0 lb

## 2019-08-14 DIAGNOSIS — E7800 Pure hypercholesterolemia, unspecified: Secondary | ICD-10-CM | POA: Diagnosis not present

## 2019-08-14 DIAGNOSIS — R42 Dizziness and giddiness: Secondary | ICD-10-CM | POA: Diagnosis not present

## 2019-08-14 DIAGNOSIS — R0789 Other chest pain: Secondary | ICD-10-CM

## 2019-08-14 NOTE — Patient Instructions (Signed)

## 2019-08-29 DIAGNOSIS — M25562 Pain in left knee: Secondary | ICD-10-CM | POA: Diagnosis not present

## 2019-08-29 DIAGNOSIS — Z96652 Presence of left artificial knee joint: Secondary | ICD-10-CM | POA: Diagnosis not present

## 2019-08-30 DIAGNOSIS — Z96652 Presence of left artificial knee joint: Secondary | ICD-10-CM | POA: Diagnosis not present

## 2019-09-23 DIAGNOSIS — G894 Chronic pain syndrome: Secondary | ICD-10-CM | POA: Diagnosis not present

## 2019-09-23 DIAGNOSIS — M1712 Unilateral primary osteoarthritis, left knee: Secondary | ICD-10-CM | POA: Diagnosis not present

## 2019-09-25 ENCOUNTER — Telehealth: Payer: Self-pay | Admitting: *Deleted

## 2019-09-25 ENCOUNTER — Telehealth: Payer: Self-pay | Admitting: Oncology

## 2019-09-25 NOTE — Telephone Encounter (Signed)
This RN spoke with pt per her call stating she is ready to start the " other drug since I stopped the tamoxifen this summer "  Note MD had discussed above at last office visit with goal for pt to start letrozole on 08/13/2019.   Per discussion- pt will start now and appointment for 11/3 will be rescheduled to later in December 2020.  This RN called pt's pharmacy and requested to proceed to fill prescription sent in July for letrozole.  Request to reschedule current appointment sent to scheduling.  No further needs at this time.

## 2019-09-25 NOTE — Telephone Encounter (Signed)
R/s appt per 10/14 sch message - pt is aware of new appt date and time

## 2019-09-30 DIAGNOSIS — E038 Other specified hypothyroidism: Secondary | ICD-10-CM | POA: Diagnosis not present

## 2019-09-30 DIAGNOSIS — M859 Disorder of bone density and structure, unspecified: Secondary | ICD-10-CM | POA: Diagnosis not present

## 2019-09-30 DIAGNOSIS — R739 Hyperglycemia, unspecified: Secondary | ICD-10-CM | POA: Diagnosis not present

## 2019-09-30 DIAGNOSIS — E7849 Other hyperlipidemia: Secondary | ICD-10-CM | POA: Diagnosis not present

## 2019-10-03 DIAGNOSIS — R82998 Other abnormal findings in urine: Secondary | ICD-10-CM | POA: Diagnosis not present

## 2019-10-03 DIAGNOSIS — Z1212 Encounter for screening for malignant neoplasm of rectum: Secondary | ICD-10-CM | POA: Diagnosis not present

## 2019-10-08 DIAGNOSIS — C50412 Malignant neoplasm of upper-outer quadrant of left female breast: Secondary | ICD-10-CM | POA: Diagnosis not present

## 2019-10-08 DIAGNOSIS — K760 Fatty (change of) liver, not elsewhere classified: Secondary | ICD-10-CM | POA: Diagnosis not present

## 2019-10-08 DIAGNOSIS — K5900 Constipation, unspecified: Secondary | ICD-10-CM | POA: Diagnosis not present

## 2019-10-08 DIAGNOSIS — F325 Major depressive disorder, single episode, in full remission: Secondary | ICD-10-CM | POA: Diagnosis not present

## 2019-10-08 DIAGNOSIS — M8580 Other specified disorders of bone density and structure, unspecified site: Secondary | ICD-10-CM | POA: Diagnosis not present

## 2019-10-08 DIAGNOSIS — M1990 Unspecified osteoarthritis, unspecified site: Secondary | ICD-10-CM | POA: Diagnosis not present

## 2019-10-08 DIAGNOSIS — R42 Dizziness and giddiness: Secondary | ICD-10-CM | POA: Diagnosis not present

## 2019-10-08 DIAGNOSIS — Z0000 Encounter for general adult medical examination without abnormal findings: Secondary | ICD-10-CM | POA: Diagnosis not present

## 2019-10-08 DIAGNOSIS — R945 Abnormal results of liver function studies: Secondary | ICD-10-CM | POA: Diagnosis not present

## 2019-10-08 DIAGNOSIS — I700 Atherosclerosis of aorta: Secondary | ICD-10-CM | POA: Diagnosis not present

## 2019-10-08 DIAGNOSIS — N281 Cyst of kidney, acquired: Secondary | ICD-10-CM | POA: Diagnosis not present

## 2019-10-08 DIAGNOSIS — H269 Unspecified cataract: Secondary | ICD-10-CM | POA: Diagnosis not present

## 2019-10-11 DIAGNOSIS — K449 Diaphragmatic hernia without obstruction or gangrene: Secondary | ICD-10-CM | POA: Diagnosis not present

## 2019-10-11 DIAGNOSIS — R112 Nausea with vomiting, unspecified: Secondary | ICD-10-CM | POA: Diagnosis not present

## 2019-10-11 DIAGNOSIS — K5901 Slow transit constipation: Secondary | ICD-10-CM | POA: Diagnosis not present

## 2019-10-14 ENCOUNTER — Other Ambulatory Visit: Payer: Self-pay | Admitting: Gastroenterology

## 2019-10-14 DIAGNOSIS — R112 Nausea with vomiting, unspecified: Secondary | ICD-10-CM

## 2019-10-15 ENCOUNTER — Other Ambulatory Visit: Payer: Medicare Other

## 2019-10-15 ENCOUNTER — Ambulatory Visit: Payer: Medicare Other | Admitting: Oncology

## 2019-10-18 DIAGNOSIS — M7632 Iliotibial band syndrome, left leg: Secondary | ICD-10-CM | POA: Diagnosis not present

## 2019-10-18 DIAGNOSIS — Z96652 Presence of left artificial knee joint: Secondary | ICD-10-CM | POA: Diagnosis not present

## 2019-10-21 ENCOUNTER — Other Ambulatory Visit: Payer: Self-pay | Admitting: General Surgery

## 2019-10-21 DIAGNOSIS — C50412 Malignant neoplasm of upper-outer quadrant of left female breast: Secondary | ICD-10-CM

## 2019-10-21 DIAGNOSIS — Z170 Estrogen receptor positive status [ER+]: Secondary | ICD-10-CM

## 2019-10-22 ENCOUNTER — Other Ambulatory Visit: Payer: Self-pay | Admitting: *Deleted

## 2019-10-22 MED ORDER — LETROZOLE 2.5 MG PO TABS: 2.5000 mg | ORAL_TABLET | Freq: Every day | ORAL | 4 refills | Status: DC

## 2019-10-23 ENCOUNTER — Ambulatory Visit
Admission: RE | Admit: 2019-10-23 | Discharge: 2019-10-23 | Disposition: A | Discharge status: Home / Self Care | Payer: Medicare Other | Source: Ambulatory Visit | Attending: Gastroenterology | Admitting: Gastroenterology

## 2019-10-23 DIAGNOSIS — K449 Diaphragmatic hernia without obstruction or gangrene: Secondary | ICD-10-CM | POA: Diagnosis not present

## 2019-10-23 DIAGNOSIS — K219 Gastro-esophageal reflux disease without esophagitis: Secondary | ICD-10-CM | POA: Diagnosis not present

## 2019-10-23 DIAGNOSIS — R112 Nausea with vomiting, unspecified: Secondary | ICD-10-CM

## 2019-11-06 DIAGNOSIS — H16142 Punctate keratitis, left eye: Secondary | ICD-10-CM | POA: Diagnosis not present

## 2019-11-15 ENCOUNTER — Other Ambulatory Visit (HOSPITAL_COMMUNITY): Payer: Self-pay | Admitting: Gastroenterology

## 2019-11-15 ENCOUNTER — Other Ambulatory Visit: Payer: Self-pay | Admitting: Gastroenterology

## 2019-11-15 DIAGNOSIS — R7401 Elevation of levels of liver transaminase levels: Secondary | ICD-10-CM

## 2019-11-15 DIAGNOSIS — K449 Diaphragmatic hernia without obstruction or gangrene: Secondary | ICD-10-CM | POA: Diagnosis not present

## 2019-11-15 DIAGNOSIS — K5901 Slow transit constipation: Secondary | ICD-10-CM | POA: Diagnosis not present

## 2019-11-25 ENCOUNTER — Other Ambulatory Visit: Payer: Self-pay | Admitting: *Deleted

## 2019-11-25 DIAGNOSIS — Z170 Estrogen receptor positive status [ER+]: Secondary | ICD-10-CM

## 2019-11-25 NOTE — Progress Notes (Signed)
[place]  [phone] [phone]     ID: RONNAE KASER DOB: December 30, 1945  MR#: [id number]  XIH#:[id number]  Patient Care Team: Shon Baton, MD as PCP - General Rolm Bookbinder, MD as Consulting Physician (General Surgery) Bronsen Serano, Virgie Dad, MD as Consulting Physician (Oncology) Eppie Gibson, MD as Attending Physician (Radiation Oncology) Lelon Perla, MD as Consulting Physician (Cardiology) [name], MD as Consulting Physician (Obstetrics and Gynecology) Jacquelynn Cree, PT as Physical Therapist (Physical Therapy) OTHER MD:  CHIEF COMPLAINT: Estrogen receptor positive breast cancer  CURRENT TREATMENT: [letrozole]   INTERVAL HISTORY: [name] returns today for follow-up of her estrogen receptor positive breast cancer.  She was supposed to have started letrozole in early September but actually did not start until mid October.  Since starting letrozole she has noted that she gets very confused and cannot remember anything.  She says her husband has told her she needs to get off the medication because she cannot live this way.    She also started Lyrica at the same time.  Since her last visit, she underwent 3-phase bone scan on 08/12/2019, which showed: mild uptake near the left patella on vascular and blood pool phases suggests soft tissue inflammation or infection; no focal delayed uptake in this region to suggest hardware failure, infection, or loosening.  She also underwent upper GI series on 10/23/2019 for history of hiatal hernia with worsening chronic intermittent vomiting. This showed: moderate to large hiatal hernia, mildly increased since 2013 upper GI, mild gastroesophageal reflux elicited; suggestion of mild reflux esophagitis, with no evidence of esophageal mass, stricture or ulcer.  She is scheduled for abdomen ultrasound with elastography on 12/24/2019.   REVIEW OF SYSTEMS: Betsy exercises by walking for about an  hour about 3 days a week.  She denies unusual headaches visual changes nausea vomiting dizziness gait imbalance or falls.  She has pain in the left breast and was scheduled for an MRI of the breast by Dr. Donne Hazel but she read in the Internet she says on a medical site that the contrast from MRI accumulates in the blood on the brain and causes confusion so she does not want to have an MRI of the breast .  A detailed review of systems today was otherwise stable.    HISTORY OF CURRENT ILLNESS: From the original intake note:  "[name]" had routine diagnostic mammography on 03/08/2018 showing breast density category D.  There was a 1.5 cm irregular mass in the left upper outer quadrant posterior depth.  This was further imaged with ultrasonography on the same day.  The left axilla was sonographically benign.  No abnormalities were seen in the left axilla.  Accordingly on 03/08/2018 she proceeded to biopsy of the left breast area in question. The pathology from this procedure showed ([id number]): Invasive ductal carcinoma, grade I-II. HER-2 negative with the ratio being 1.61 and number per cell 3.30.  The tumor was estrogen receptor 100% positive and progesterone receptor 100% positive, both with strong staining intensity.  Ki67 was <1%.   On 03/18/2018, she underwent a bilateral breast MRI with and without contrast, showing the mass in question in a setting of 3.3 cm non-masslike enhancement in the posterior aspect of the left upper-outer quadrant. in addition there was a 1.1 cm mass in the left upper-outer quadrant located 2.1 cm anterior and lateral to the biopsy proven malignant.   Right breast showed two indeterminate 5 mm adjacent enhancing nodules in the upper-inner quadrant.   The  patient's subsequent history is as detailed below.   PAST MEDICAL HISTORY: Past Medical History:  Diagnosis Date  . Arthritis    hands and knees  . Complication of anesthesia    aspiration pna; following a  colonoscopy, pt requests head of bed elevated if possible.  . Constipation   . Cough   . Depression   . Dysrhythmia    RBBB on 06-06-17 ekg   . Elevated liver function tests   . Esophageal stricture   . Family history of breast cancer   . Gallstones   . Genetic testing 04/02/2018   STAT Breast panel with reflex to Multi-Cancer panel (83 genes) @ [facility] - No pathogenic mutations detected  . GERD (gastroesophageal reflux disease)   . Hiatal hernia   . History of kidney stones   . History of radiation therapy 07/09/2018- 08/03/18   Left Breast/ 40.05 Gy in 15 fractions. Left Breast boost 10 Gy in 5 fractions.   . Hyperlipemia   . Hypothyroidism   . Insomnia   . Malignant neoplasm of upper-outer quadrant of left female breast ([place])   . Nephrolithiasis   . Pneumonia 2015   aspirated after colonosopy  . Renal cyst     PAST SURGICAL HISTORY: Past Surgical History:  Procedure Laterality Date  . BREAST LUMPECTOMY WITH RADIOACTIVE SEED AND SENTINEL LYMPH NODE BIOPSY Left 04/17/2018   Procedure: LEFT BREAST LUMPECTOMY WITH BRACKETED RADIOACTIVE SEEDS AND SENTINEL LYMPH NODE BIOPSY;  Surgeon: Rolm Bookbinder, MD;  Location: [facility];  Service: General;  Laterality: Left;  . BREAST SURGERY Left    Lumpectomy  . CARDIOVASCULAR STRESS TEST  07/19/2006   EF 70%, NO EVIDENCE OF ISCHEMIA  . CHOLECYSTECTOMY  12/30/10  . COLONOSCOPY    . DILATION AND CURETTAGE OF UTERUS    . HYSTEROSCOPY W/D&C N/A 06/07/2018   Procedure: DILATATION AND CURETTAGE /HYSTEROSCOPY;  Surgeon: [name], MD;  Location: [facility];  Service: Gynecology;  Laterality: N/A;  . inguinal herniography    . NASAL SINUS SURGERY    . RADIOACTIVE SEED GUIDED EXCISIONAL BREAST BIOPSY Right 04/17/2018   Procedure: RIGHT BREAST SEED GUIDED EXCISIONAL BIOPSY;  Surgeon: Rolm Bookbinder, MD;  Location: [facility];  Service: General;  Laterality: Right;  . RE-EXCISION OF BREAST LUMPECTOMY Left 05/15/2018    Procedure: RE-EXCISION OF LEFT BREAST LUMPECTOMY, ASPIRATION OF LEFT AXILLARY SEROMA;  Surgeon: Rolm Bookbinder, MD;  Location: [facility];  Service: General;  Laterality: Left;  . T & A    . TOOTH EXTRACTION    . TOTAL KNEE ARTHROPLASTY Left 09/11/2017   Procedure: LEFT TOTAL KNEE ARTHROPLASTY;  Surgeon: Gaynelle Arabian, MD;  Location: [facility];  Service: Orthopedics;  Laterality: Left;  with block  . US ECHOCARDIOGRAPHY  07/17/2006   EF 55-60%    FAMILY HISTORY Family History  Problem Relation Age of Onset  . Breast cancer Mother 35       deceased 82; TAH/BSO in 77s/50s  . Dementia Father        deceased 18  . Breast cancer Maternal Aunt 82       deceased 40s  . Colon cancer Neg Hx   . Stomach cancer Neg Hx    Her father died of old age at 47 years old. Her mother had breast cancer at 62 and died at 73 years old. She will undergo genetic testing. Her maternal aunt had breast cancer at 75 years old. No family history of ovarian cancer. She  had one brother and one sister.    GYNECOLOGIC HISTORY:  No LMP recorded. Patient is postmenopausal. Menarche: 73 years old [place] P 0 LMP: 1997 Contraceptive: 2 years HRT: Estrogen and progesterone for about 7 years  Hysterectomy? No   SOCIAL HISTORY:  She is a retired Futures trader. She has been married to [place], for 45 plus years. He worked in the [employer]. Together they adopted two children. One daughter, [name], 31 of [place], who is a Psychologist, sport and exercise for an OBGYN, and one son, Kamil Hanigan, 36 of [place], who owns a Marketing executive business. The patient has three grandchildren. She attends [employer].    ADVANCED DIRECTIVES: In place   HEALTH MAINTENANCE: Social History   Tobacco Use  . Smoking status: Never Smoker  . Smokeless tobacco: Never Used  Substance Use Topics  . Alcohol use: No  . Drug use: No    Colonoscopy: 2014/ [facility]  PAP: 2018  Bone density:  May 2014  at [facility] showed a T score of -0.9 normal   Allergies  Allergen Reactions  . Oxycontin [Oxycodone Hcl]     "it made me feel very high and hyper"   . Codeine Nausea And Vomiting    Current Outpatient Medications  Medication Sig Dispense Refill  . acetaminophen (TYLENOL) 500 MG tablet Take 500 mg by mouth every 6 (six) hours as needed for mild pain or headache.    Marland Kitchen azelastine (ASTELIN) 0.1 % nasal spray Place 1 spray into both nostrils 2 (two) times daily. Use in each nostril as directed    . BIOTIN PO Take 1 tablet by mouth daily.    . Cholecalciferol (VITAMIN D3) 1000 units CAPS Take 1 capsule by mouth 2 (two) times daily.     Marland Kitchen escitalopram (LEXAPRO) 10 MG tablet Take 15 mg by mouth daily.     Marland Kitchen esomeprazole (NEXIUM) 40 MG capsule Take 40 mg by mouth 2 (two) times daily before a meal.    . ezetimibe (ZETIA) 10 MG tablet Take 10 mg by mouth at bedtime.    . fluticasone (FLONASE) 50 MCG/ACT nasal spray Place 1 spray into both nostrils daily.    Marland Kitchen levothyroxine (SYNTHROID, LEVOTHROID) 50 MCG tablet Take 50 mcg by mouth at bedtime.    . Magnesium 250 MG TABS Take 1 tablet by mouth daily.     . naproxen sodium (ALEVE) 220 MG tablet Take 220 mg by mouth 2 (two) times daily as needed (pain).    Marland Kitchen OVER THE COUNTER MEDICATION Apply 1 application topically daily as needed (pain). Natural pain relief ointment    . Probiotic Product (PROBIOTIC ADVANCED PO) Take 1 tablet by mouth daily.    . rosuvastatin (CRESTOR) 5 MG tablet Take 5 mg by mouth at bedtime.     . sodium chloride (OCEAN) 0.65 % SOLN nasal spray Place 4 sprays into both nostrils 4 (four) times daily.    . SUPER B COMPLEX/C PO Take 1 tablet by mouth daily.    Marland Kitchen zolpidem (AMBIEN) 10 MG tablet zolpidem 10 mg tablet  take 1 tablet by mouth at bedtime if needed     No current facility-administered medications for this visit.    OBJECTIVE: Middle-aged white woman who appears stated age 24:   11/26/19 1048  BP: 120/80  Pulse: 67    Resp: 18  Temp: 98.2 F (36.8 C)  SpO2: 100%     Body mass index is 25 kg/m.   Wt Readings from  Last 3 Encounters:  11/26/19 164 lb 6.4 oz (74.6 kg)  08/14/19 161 lb (73 kg)  06/24/19 161 lb 3.2 oz (73.1 kg)      ECOG FS:1 - Symptomatic but completely ambulatory  Sclerae unicteric, EOMs intact Wearing a mask No cervical or supraclavicular adenopathy Lungs no rales or rhonchi Heart regular rate and rhythm Abd soft, nontender, positive bowel sounds MSK no focal spinal tenderness, no upper extremity lymphedema Neuro: nonfocal Breasts: I do not palpate a mass in the right breast, which is status post lumpectomy and radiation.  The cosmetic result is excellent.  The left breast is unremarkable.  The axillae are benign.   LAB RESULTS:  CMP     Component Value Date/Time   NA 142 11/26/2019 1024   NA 144 06/06/2017 1448   K 4.3 11/26/2019 1024   CL 107 11/26/2019 1024   CO2 28 11/26/2019 1024   GLUCOSE 91 11/26/2019 1024   BUN 12 11/26/2019 1024   BUN 12 06/06/2017 1448   CREATININE 0.80 11/26/2019 1024   CALCIUM 9.2 11/26/2019 1024   PROT 6.6 11/26/2019 1024   PROT 6.3 06/06/2017 1448   ALBUMIN 4.1 11/26/2019 1024   ALBUMIN 4.4 06/06/2017 1448   AST 45 (H) 11/26/2019 1024   ALT 31 11/26/2019 1024   ALKPHOS 74 11/26/2019 1024   BILITOT 0.7 11/26/2019 1024   GFRNONAA >60 11/26/2019 1024   GFRAA >60 11/26/2019 1024    No results found for: TOTALPROTELP, ALBUMINELP, A1GS, A2GS, BETS, BETA2SER, GAMS, MSPIKE, SPEI  No results found for: KPAFRELGTCHN, LAMBDASER, KAPLAMBRATIO  Lab Results  Component Value Date   WBC 7.2 11/26/2019   NEUTROABS 2.5 11/26/2019   HGB 12.9 11/26/2019   HCT 40.1 11/26/2019   MCV 90.3 11/26/2019   PLT 190 11/26/2019   No results found for: LABCA2  No components found for: TDDUKG254  No results for input(s): INR in the last 168 hours.  No results found for: LABCA2  No results found for: YHC623  No results found for: JSE831  No  results found for: DVV616  No results found for: CA2729  No components found for: HGQUANT  No results found for: CEA1 / No results found for: CEA1   No results found for: AFPTUMOR  No results found for: CHROMOGRNA  No results found for: PSA1  Appointment on 11/26/2019  Component Date Value Ref Range Status  . Sodium 11/26/2019 142  135 - 145 mmol/L Final  . Potassium 11/26/2019 4.3  3.5 - 5.1 mmol/L Final  . Chloride 11/26/2019 107  98 - 111 mmol/L Final  . CO2 11/26/2019 28  22 - 32 mmol/L Final  . Glucose, Bld 11/26/2019 91  70 - 99 mg/dL Final  . BUN 11/26/2019 12  8 - 23 mg/dL Final  . Creatinine 11/26/2019 0.80  0.44 - 1.00 mg/dL Final  . Calcium 11/26/2019 9.2  8.9 - 10.3 mg/dL Final  . Total Protein 11/26/2019 6.6  6.5 - 8.1 g/dL Final  . Albumin 11/26/2019 4.1  3.5 - 5.0 g/dL Final  . AST 11/26/2019 45* 15 - 41 U/L Final  . ALT 11/26/2019 31  0 - 44 U/L Final  . Alkaline Phosphatase 11/26/2019 74  38 - 126 U/L Final  . Total Bilirubin 11/26/2019 0.7  0.3 - 1.2 mg/dL Final  . GFR, Est Non Af Am 11/26/2019 >60  >60 mL/min Final  . GFR, Est AFR Am 11/26/2019 >60  >60 mL/min Final  . Anion gap 11/26/2019 7  5 -  15 Final   Performed at [facility], [place] [address] [place], [place] [address]  . WBC Count 11/26/2019 7.2  4.0 - 10.5 K/uL Final  . RBC 11/26/2019 4.44  3.87 - 5.11 MIL/uL Final  . Hemoglobin 11/26/2019 12.9  12.0 - 15.0 g/dL Final  . HCT 11/26/2019 40.1  36.0 - 46.0 % Final  . MCV 11/26/2019 90.3  80.0 - 100.0 fL Final  . MCH 11/26/2019 29.1  26.0 - 34.0 pg Final  . MCHC 11/26/2019 32.2  30.0 - 36.0 g/dL Final  . RDW 11/26/2019 13.4  11.5 - 15.5 % Final  . Platelet Count 11/26/2019 190  150 - 400 K/uL Final  . nRBC 11/26/2019 0.0  0.0 - 0.2 % Final  . Neutrophils Relative % 11/26/2019 34  % Final  . Neutro Abs 11/26/2019 2.5  1.7 - 7.7 K/uL Final  . Lymphocytes Relative 11/26/2019 47  % Final  . Lymphs Abs 11/26/2019 3.4  0.7 -  4.0 K/uL Final  . Monocytes Relative 11/26/2019 10  % Final  . Monocytes Absolute 11/26/2019 0.8  0.1 - 1.0 K/uL Final  . Eosinophils Relative 11/26/2019 8  % Final  . Eosinophils Absolute 11/26/2019 0.6* 0.0 - 0.5 K/uL Final  . Basophils Relative 11/26/2019 1  % Final  . Basophils Absolute 11/26/2019 0.1  0.0 - 0.1 K/uL Final  . Immature Granulocytes 11/26/2019 0  % Final  . Abs Immature Granulocytes 11/26/2019 0.01  0.00 - 0.07 K/uL Final   Performed at [facility], [address] [place], [place] [address]    (this displays the last labs from the last 3 days)  No results found for: TOTALPROTELP, ALBUMINELP, A1GS, A2GS, BETS, BETA2SER, GAMS, MSPIKE, SPEI (this displays SPEP labs)  No results found for: KPAFRELGTCHN, LAMBDASER, KAPLAMBRATIO (kappa/lambda light chains)  No results found for: HGBA, HGBA2QUANT, HGBFQUANT, HGBSQUAN (Hemoglobinopathy evaluation)   No results found for: LDH  No results found for: IRON, TIBC, IRONPCTSAT (Iron and TIBC)  No results found for: FERRITIN  Urinalysis    Component Value Date/Time   COLORURINE YELLOW 08/22/2016 1947   APPEARANCEUR CLEAR 08/22/2016 1947   LABSPEC 1.026 08/22/2016 1947   PHURINE 5.5 08/22/2016 1947   GLUCOSEU NEGATIVE 08/22/2016 1947   HGBUR SMALL (A) 08/22/2016 1947   BILIRUBINUR NEGATIVE 08/22/2016 [place] NEGATIVE 08/22/2016 1947   PROTEINUR NEGATIVE 08/22/2016 1947   UROBILINOGEN 0.2 08/22/2016 1622   NITRITE NEGATIVE 08/22/2016 1947   LEUKOCYTESUR SMALL (A) 08/22/2016 1947    STUDIES: No results found.   ASSESSMENT: 73 y.o. [place] woman status post left breast upper outer quadrant biopsy 03/08/2018 for a clinical T1c-T2 N0, stage I invasive ductal carcinoma, grade 1 or 2, estrogen and progesterone receptor positive, HER-2 not amplified, with an MIB-1 of less than 1%  (1) additional biopsies as follows:  (a) right breast biopsy ([id number]) on 04/02/2018 showed  Atypical lobular hyperplasia  (b) left breast ([id number]) on 03/22/2018 showed invasive lobular carcinoma, grade 2, estrogen and progesterone receptor positive with an MIB-1 of 5%. HER-2 not amplified  (2) bilateral lumpectomies and left sentinel lymph node sampling 04/17/2018 found  (a) on the right side, atypical lobular hyperplasia  (b) on the left side, an mpT1c pN0, stage IA, grade 2 invasive lobular carcinoma, with positive margins  (c) additional left breast surgery on 05/15/2018 cleared the compromise margins  (3) The Oncotype DX score was 14, predicting a risk of outside the breast recurrence over the  next 9 years of 4% if the patient's only systemic therapy is tamoxifen for 5 years.  It also predicts no significant benefit from chemotherapy.  (4) adjuvant radiation 07/09/2018 - 08/03/2018 Site/dose:    1. Left Breast / 40.05 Gy in 15 fractions 2. Left Breast Boost / 10 Gy in 5 fractions  (5) started tamoxifen 09/11/2018, discontinued June 2020 with cognitive issues   (6) genetics testing 03/22/2018 through [facility]'s STAT Breast panel with reflex to Multi-Cancer panel found no pathogenic mutations in (ALK, APC, ATM, AXIN2, BAP1, BARD1, BLM, BMPR1A, BRCA1, BRCA2, BRIP1, CASR, CDC73, CDH1, CDK4, CDKN1B, CDKN1C, CDKN2A, CEBPA, CHEK2, CTNNA1, DICER1, DIS3L2, EGFR, EPCAM, FH, FLCN, GATA2, GPC3, GREM1, HOXB13, HRAS, KIT, MAX, MEN1, MET, MITF, MLH1, MSH2, MSH3, MSH6, MUTYH, NBN, NF1, NF2, NTHL1, PALB2, PDGFRA, PHOX2B, PMS2, POLD1, POLE, POT1, PRKAR1A, PTCH1, PTEN, RAD50, RAD51C, RAD51D, RB1, RECQL4, RET, RUNX1, SDHA, SDHAF2, SDHB, SDHC, SDHD, SMAD4, SMARCA4, SMARCB1, SMARCE1, STK11, SUFU, TERC, TERT, TMEM127, TP53, TSC1, TSC2, VHL, WRN, WT1).  (a) Variants of Uncertain Significance were found in in PDGFRA and RECQL4.  (7) started letrozole 09/26/2019, discontinued 11/26/2019 with cognitive issues  (a) bone density May 2014 normal, T score -0.9  PLAN: [name] is having significant  cognitive issues.  It could be the letrozole although I think Lyrica is a better candidate.  The only way to find of course is to stop the letrozole which is what we are doing.  She will meanwhile continue the Lyrica.  We will talk by my chart video mid January.  At that point she will have been off the letrozole for a month so if the cognitive issues persist it likely would not be that medication.  We could then start letrozole again on Monday Wednesday Friday and see how she does.  Alternatively we could simply stop the Lyrica at that time and see if the mental fog clears  As noted above she does not want a breast MRI.  I have set her up for an ultrasound of the left breast.  She may want to discuss the results with Dr. Donne Hazel further when she sees them in January.  She knows to call for any other issue that may develop before the next visit.    Kasin Tonkinson, Virgie Dad, MD  11/26/19 11:27 AM Medical Oncology and Hematology [facility] [place], [place] [address] Tel. [phone]    Fax. [phone]   I, Wilburn Mylar, am acting as scribe for Dr. Virgie Dad. Lynell Greenhouse.  I, Lurline Del MD, have reviewed the above documentation for accuracy and completeness, and I agree with the above.

## 2019-11-26 ENCOUNTER — Other Ambulatory Visit: Payer: Self-pay

## 2019-11-26 ENCOUNTER — Inpatient Hospital Stay (HOSPITAL_BASED_OUTPATIENT_CLINIC_OR_DEPARTMENT_OTHER): Payer: Medicare Other | Admitting: Oncology

## 2019-11-26 ENCOUNTER — Inpatient Hospital Stay: Payer: Medicare Other | Attending: Oncology

## 2019-11-26 VITALS — BP 120/80 | HR 67 | Temp 98.2°F | Resp 18 | Ht 68.0 in | Wt 164.4 lb

## 2019-11-26 DIAGNOSIS — Z79899 Other long term (current) drug therapy: Secondary | ICD-10-CM | POA: Diagnosis not present

## 2019-11-26 DIAGNOSIS — C50412 Malignant neoplasm of upper-outer quadrant of left female breast: Secondary | ICD-10-CM

## 2019-11-26 DIAGNOSIS — Z170 Estrogen receptor positive status [ER+]: Secondary | ICD-10-CM

## 2019-11-26 DIAGNOSIS — Z79811 Long term (current) use of aromatase inhibitors: Secondary | ICD-10-CM | POA: Insufficient documentation

## 2019-11-26 LAB — CBC WITH DIFFERENTIAL (CANCER CENTER ONLY)
Abs Immature Granulocytes: 0.01 10*3/uL (ref 0.00–0.07)
Basophils Absolute: 0.1 10*3/uL (ref 0.0–0.1)
Basophils Relative: 1 %
Eosinophils Absolute: 0.6 10*3/uL — ABNORMAL HIGH (ref 0.0–0.5)
Eosinophils Relative: 8 %
HCT: 40.1 % (ref 36.0–46.0)
Hemoglobin: 12.9 g/dL (ref 12.0–15.0)
Immature Granulocytes: 0 %
Lymphocytes Relative: 47 %
Lymphs Abs: 3.4 10*3/uL (ref 0.7–4.0)
MCH: 29.1 pg (ref 26.0–34.0)
MCHC: 32.2 g/dL (ref 30.0–36.0)
MCV: 90.3 fL (ref 80.0–100.0)
Monocytes Absolute: 0.8 10*3/uL (ref 0.1–1.0)
Monocytes Relative: 10 %
Neutro Abs: 2.5 10*3/uL (ref 1.7–7.7)
Neutrophils Relative %: 34 %
Platelet Count: 190 10*3/uL (ref 150–400)
RBC: 4.44 MIL/uL (ref 3.87–5.11)
RDW: 13.4 % (ref 11.5–15.5)
WBC Count: 7.2 10*3/uL (ref 4.0–10.5)
nRBC: 0 % (ref 0.0–0.2)

## 2019-11-26 LAB — CMP (CANCER CENTER ONLY)
ALT: 31 U/L (ref 0–44)
AST: 45 U/L — ABNORMAL HIGH (ref 15–41)
Albumin: 4.1 g/dL (ref 3.5–5.0)
Alkaline Phosphatase: 74 U/L (ref 38–126)
Anion gap: 7 (ref 5–15)
BUN: 12 mg/dL (ref 8–23)
CO2: 28 mmol/L (ref 22–32)
Calcium: 9.2 mg/dL (ref 8.9–10.3)
Chloride: 107 mmol/L (ref 98–111)
Creatinine: 0.8 mg/dL (ref 0.44–1.00)
GFR, Est AFR Am: >60 mL/min (ref >60)
GFR, Estimated: >60 mL/min (ref >60)
Glucose, Bld: 91 mg/dL (ref 70–99)
Potassium: 4.3 mmol/L (ref 3.5–5.1)
Sodium: 142 mmol/L (ref 135–145)
Total Bilirubin: 0.7 mg/dL (ref 0.3–1.2)
Total Protein: 6.6 g/dL (ref 6.5–8.1)

## 2019-11-27 ENCOUNTER — Telehealth: Payer: Self-pay | Admitting: Oncology

## 2019-11-27 NOTE — Telephone Encounter (Signed)
I left a message regarding video visit  °

## 2019-11-28 ENCOUNTER — Ambulatory Visit: Payer: Medicare Other | Attending: Internal Medicine

## 2019-11-28 DIAGNOSIS — Z20822 Contact with and (suspected) exposure to covid-19: Secondary | ICD-10-CM

## 2019-11-29 ENCOUNTER — Other Ambulatory Visit: Payer: Medicare Other

## 2019-11-30 LAB — NOVEL CORONAVIRUS, NAA: SARS-CoV-2, NAA: NOT DETECTED

## 2019-12-12 IMAGING — MR MR BREAST BX W LOC DEV EA ADD LESION IMAGE BX SPEC MR GUIDE*R*
8 of 11 series · 36 of 48 positions shown · IV contrast (multihance)
Comparison: Previous exams.

ADDENDUM:
Pathology revealed LOBULAR NEOPLASIA (ATYPICAL LOBULAR HYPERLASIA,
FIBROCYSTIC CHANGES WITH ADENOSIS AND CALCIFICATIONS of the RIGHT
breast, superior posterior). This was found to be concordant by Dr.
Yuksim Kerinduan with excision recommended.

Pathology revealed FIBROCYSTIC CHANGES of RIGHT breast, inferior
anterior. This was found to be concordant by Dr. Yuksim Kerinduan.
Pathology results were discussed with the patient and her husband,
per patient request, by telephone. The patient reported doing well
after the biopsy with tenderness at the site. Post biopsy
instructions and care were reviewed and questions were answered. The
patient was encouraged to call [REDACTED] for any additional concerns.
The patient has a recent diagnosis of LEFT breast cancer and should
follow her outlined treatment plan. The patient has a surgical
consultation with Dr. Kansei Woodbury at [[email or web address]]
on April 09, 2018.
Pathology results reported by Veridiane Pichler, RN on 04/04/2018.
CLINICAL DATA: Patient with recent biopsy-proven left breast cancer
posterior third upper outer quadrant as well as biopsy-proven site
of malignancy over the 2-3 o'clock position of the left breast
anterior inferior to the index mass. Patient presents today for MRI
guided core needle biopsy of 2 adjacent 5 mm enhancing masses over
the anterior third of the right upper inner quadrant.
EXAM:
MRI GUIDED CORE NEEDLE BIOPSY OF THE RIGHT BREAST
TECHNIQUE: Multiplanar, multisequence MR imaging of the right breast was
performed both before and after administration of intravenous
contrast.
CONTRAST:  16mL MULTIHANCE GADOBENATE DIMEGLUMINE 529 MG/ML IV SOLN

[Series 2: fiducial bilateral · sagittal · 2.0mm · 1.17mm/px · 6 of 160 slices shown (1 of 2)]
[im 1/160]
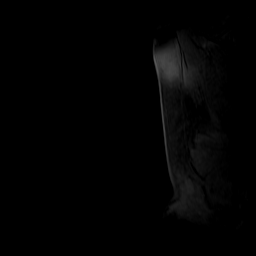
[im 32/160]
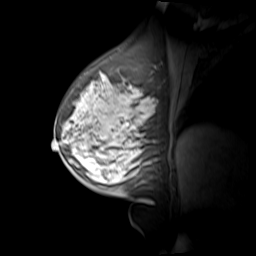
[im 64/160]
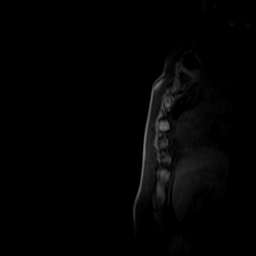
[im 96/160]
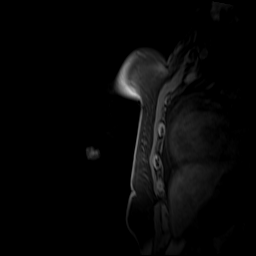
[im 128/160]
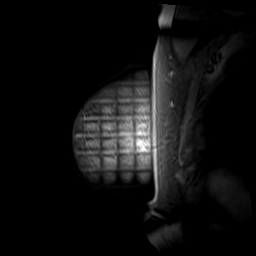
[im 160/160]
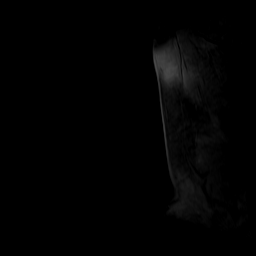

[Series 3: dynamic pre · axial · non-contrast · 1.3mm · 0.78mm/px · z∈[-92,+93]mm · 5 of 144 slices shown]
[im 1/144]
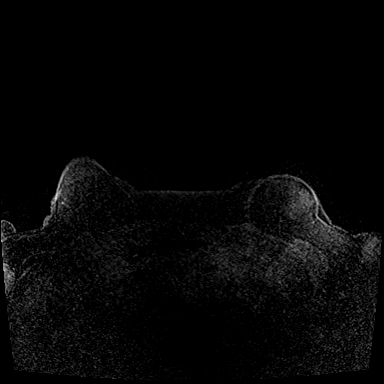
[im 36/144]
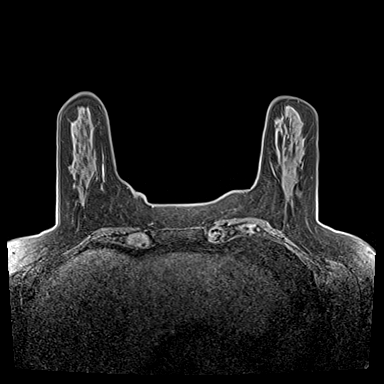
[im 72/144]
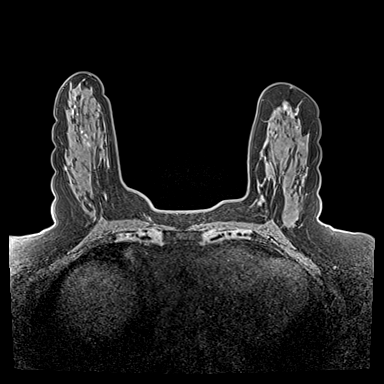
[im 108/144]
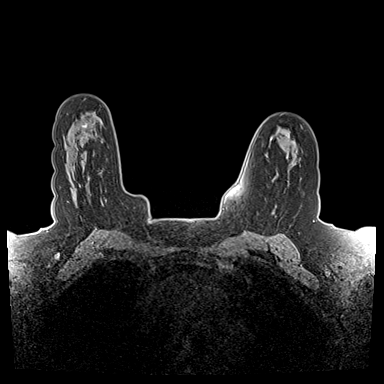
[im 144/144]
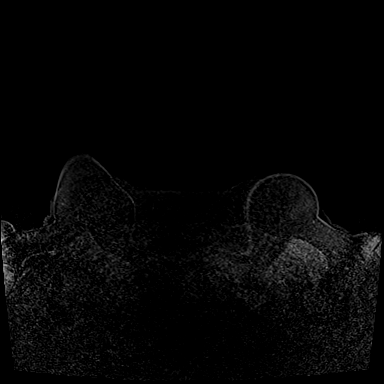

[Series 4: fiducial bilateral · sagittal · 2.0mm · 1.17mm/px · 5 of 160 slices shown (2 of 2)]
[im 1/160]
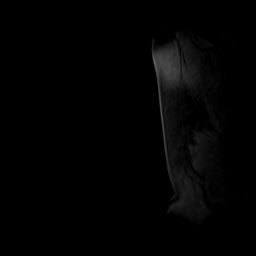
[im 40/160]
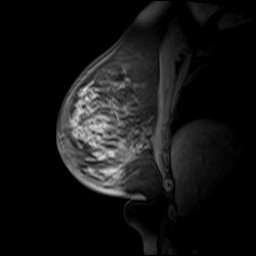
[im 80/160]
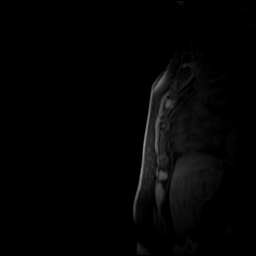
[im 120/160]
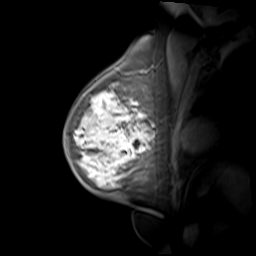
[im 160/160]
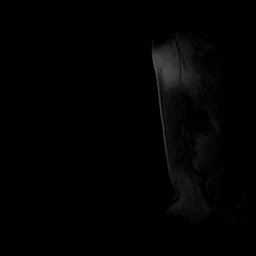

[Series 5: dynamic post 20 · axial · 1.3mm · 0.78mm/px · z∈[-92,+93]mm · 4 of 144 slices shown (1 of 2)]
[im 1/144]
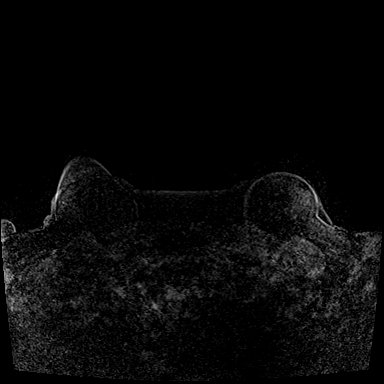
[im 48/144]
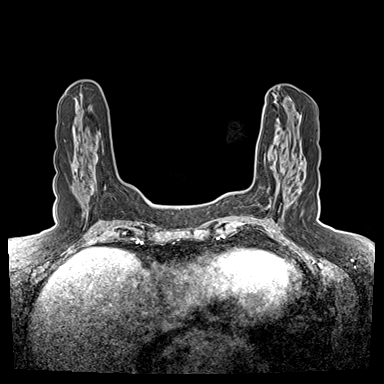
[im 96/144]
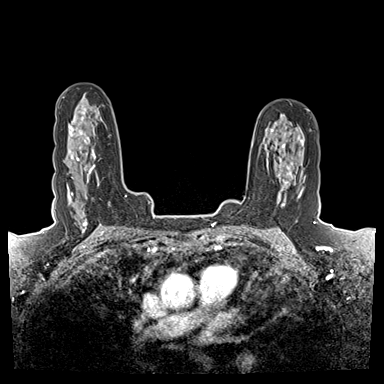
[im 144/144]
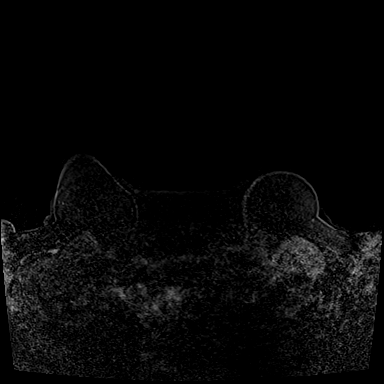

[Series 6: dynamic post 20 · axial · 1.3mm · 0.78mm/px · z∈[-92,+93]mm · 4 of 144 slices shown (2 of 2)]
[im 1/144]
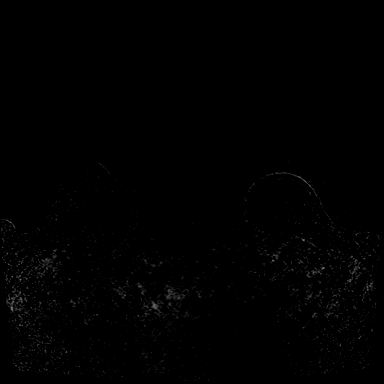
[im 48/144]
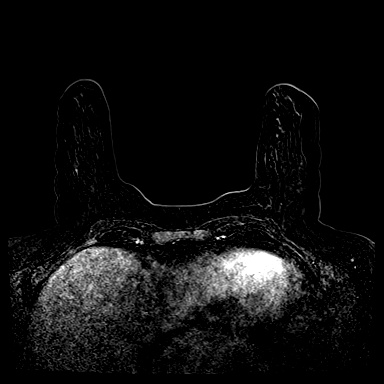
[im 96/144]
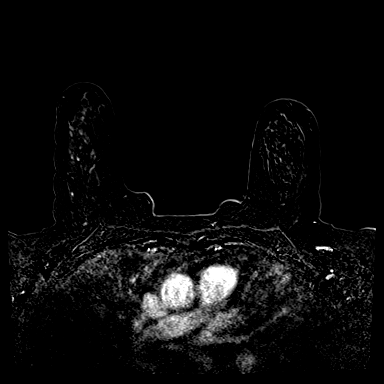
[im 144/144]
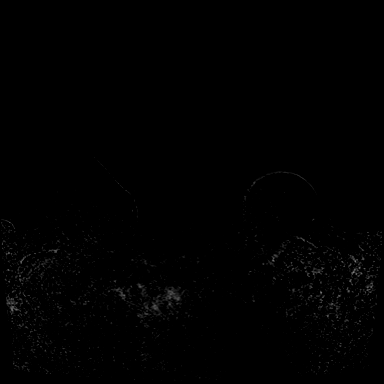

[Series 7: dynamic post 3 · axial · 1.3mm · 0.78mm/px · z∈[-92,+93]mm · 4 of 144 slices shown (1 of 2)]
[im 1/144]
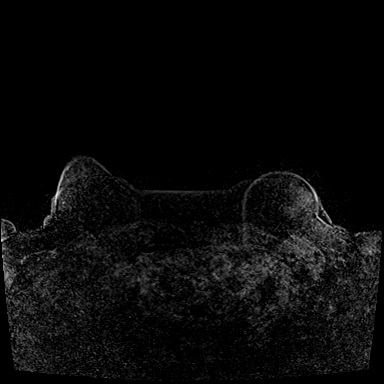
[im 48/144]
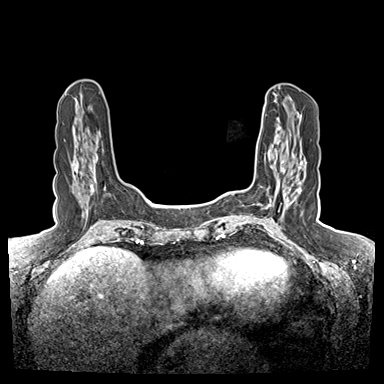
[im 96/144]
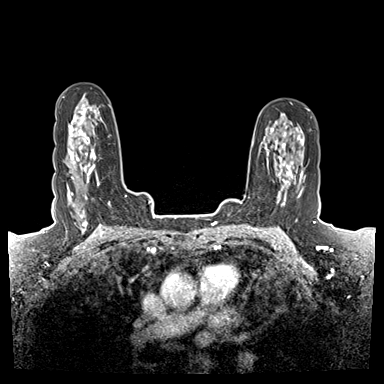
[im 144/144]
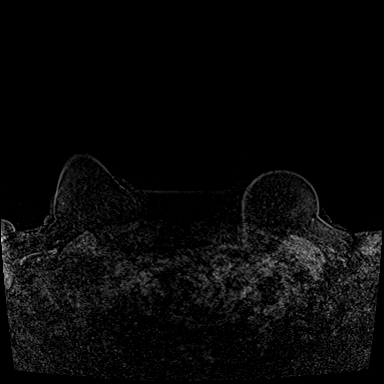

[Series 8: dynamic post 3 · axial · 1.3mm · 0.78mm/px · z∈[-92,+93]mm · 4 of 144 slices shown (2 of 2)]
[im 1/144]
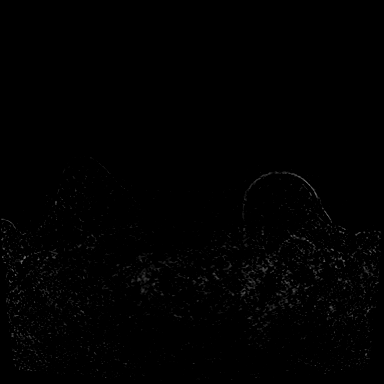
[im 48/144]
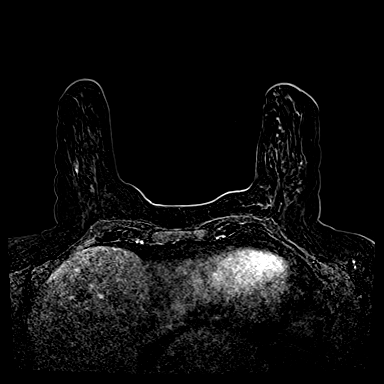
[im 96/144]
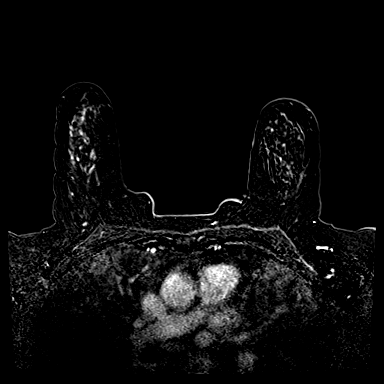
[im 144/144]
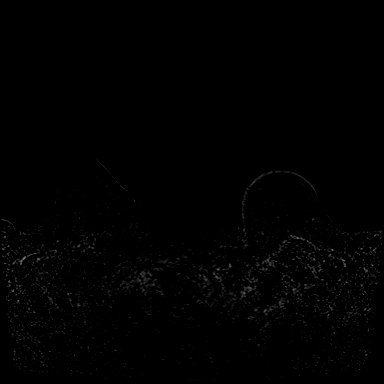

[Series 9: needle confirmation · axial · 1.3mm · 0.78mm/px · z∈[-92,+93]mm · 4 of 144 slices shown]
[im 1/144]
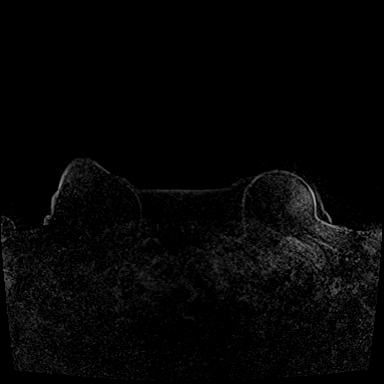
[im 48/144]
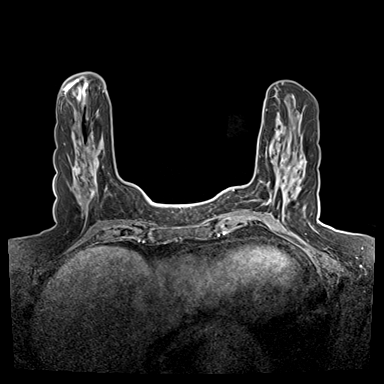
[im 96/144]
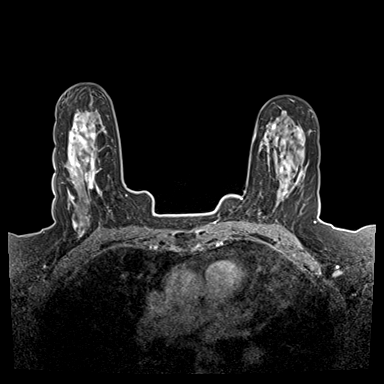
[im 144/144]
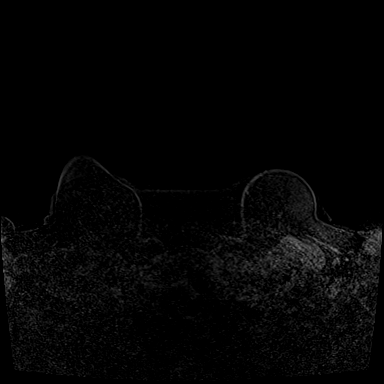

[36 of 48 positions shown; findings below may reference images not displayed]

FINDINGS: I met with the patient, and we discussed the procedure of MRI guided
biopsy, including risks, benefits, and alternatives. Specifically,
we discussed the risks of infection, bleeding, tissue injury, clip
migration, and inadequate sampling. Informed, written consent was
given. The usual time out protocol was performed immediately prior
to the procedure.

Using sterile technique, 1% Lidocaine, MRI guidance, and a 9 gauge
vacuum assisted device, biopsy was performed of the targeted mass
over the anterior third of the right upper inner quadrant more
posterior and superior in relationship to the adjacent mass using a
lateral to medial approach. At the conclusion of the procedure, a
cylinder-shaped tissue marker clip was deployed into the biopsy
cavity. Follow-up 2-view mammogram was performed and dictated
separately.

Using sterile technique, 1% Lidocaine, MRI guidance, and a 9 gauge
vacuum assisted device, biopsy was performed of the targeted mass
over the anterior third of the right upper inner quadrant more
anterior and inferior in relationship to the adjacent mass using a
lateral to medial approach. At the conclusion of the procedure, a
dumbbell-shaped tissue marker clip was deployed into the biopsy
cavity. Follow-up 2-view mammogram was performed and dictated
separately.
IMPRESSION: MRI guided biopsy of 2 adjacent indeterminate masses over the
anterior third of the right upper inner quadrant.. No apparent
complications.

## 2019-12-18 ENCOUNTER — Ambulatory Visit
Admission: RE | Admit: 2019-12-18 | Discharge: 2019-12-18 | Disposition: A | Discharge status: Home / Self Care | Payer: Medicare Other | Source: Ambulatory Visit | Attending: Oncology | Admitting: Oncology

## 2019-12-18 ENCOUNTER — Ambulatory Visit: Payer: Medicare Other | Attending: Internal Medicine

## 2019-12-18 ENCOUNTER — Other Ambulatory Visit: Payer: Self-pay

## 2019-12-18 DIAGNOSIS — Z170 Estrogen receptor positive status [ER+]: Secondary | ICD-10-CM

## 2019-12-18 DIAGNOSIS — Z20822 Contact with and (suspected) exposure to covid-19: Secondary | ICD-10-CM

## 2019-12-18 DIAGNOSIS — N644 Mastodynia: Secondary | ICD-10-CM | POA: Diagnosis not present

## 2019-12-18 DIAGNOSIS — C50412 Malignant neoplasm of upper-outer quadrant of left female breast: Secondary | ICD-10-CM

## 2019-12-20 LAB — NOVEL CORONAVIRUS, NAA: SARS-CoV-2, NAA: NOT DETECTED

## 2019-12-24 ENCOUNTER — Ambulatory Visit (HOSPITAL_COMMUNITY)
Admission: RE | Admit: 2019-12-24 | Discharge: 2019-12-24 | Disposition: A | Discharge status: Home / Self Care | Payer: Medicare Other | Source: Ambulatory Visit | Attending: Gastroenterology | Admitting: Gastroenterology

## 2019-12-24 ENCOUNTER — Other Ambulatory Visit: Payer: Self-pay

## 2019-12-24 DIAGNOSIS — R7401 Elevation of levels of liver transaminase levels: Secondary | ICD-10-CM | POA: Insufficient documentation

## 2019-12-24 DIAGNOSIS — K8020 Calculus of gallbladder without cholecystitis without obstruction: Secondary | ICD-10-CM | POA: Diagnosis not present

## 2019-12-24 DIAGNOSIS — C50919 Malignant neoplasm of unspecified site of unspecified female breast: Secondary | ICD-10-CM | POA: Diagnosis not present

## 2019-12-25 ENCOUNTER — Other Ambulatory Visit: Payer: Self-pay | Admitting: Oncology

## 2019-12-25 DIAGNOSIS — C50412 Malignant neoplasm of upper-outer quadrant of left female breast: Secondary | ICD-10-CM | POA: Diagnosis not present

## 2019-12-27 DIAGNOSIS — Z96652 Presence of left artificial knee joint: Secondary | ICD-10-CM | POA: Diagnosis not present

## 2019-12-27 DIAGNOSIS — M7632 Iliotibial band syndrome, left leg: Secondary | ICD-10-CM | POA: Diagnosis not present

## 2020-01-01 NOTE — Progress Notes (Signed)
[place]  Telephone:(336) [phone] Fax:(336) [phone]     ID: [name] DOB: 1946/12/08  MR#: [id number]  VFI#:[id number]  Patient Care Team: Shon Baton, MD as PCP - General Rolm Bookbinder, MD as Consulting Physician (General Surgery) Anabelle Bungert, Virgie Dad, MD as Consulting Physician (Oncology) Eppie Gibson, MD as Attending Physician (Radiation Oncology) Lelon Perla, MD as Consulting Physician (Cardiology) [name], MD as Consulting Physician (Obstetrics and Gynecology) Jacquelynn Cree, PT as Physical Therapist (Physical Therapy) OTHER MD:  I connected with [name] on 01/02/20 at 11:00 AM EST by video enabled telemedicine visit and verified that I am speaking with the correct person using two identifiers.   I discussed the limitations, risks, security and privacy concerns of performing an evaluation and management service by telemedicine and the availability of in-person appointments. I also discussed with the patient that there may be a patient responsible charge related to this service. The patient expressed understanding and agreed to proceed.   Other persons participating in the visit and their role in the encounter: Husband  Patient's location: home  Provider's location: [place]    CHIEF COMPLAINT: Estrogen receptor positive breast cancer  CURRENT TREATMENT: Observation   INTERVAL HISTORY: [name] was contacted today for follow-up of her estrogen receptor positive breast cancer.  Since her last visit, she underwent left breast ultrasound on 12/18/2019 for intermittent pain at the lumpectomy site and pain in the periareolar/superior aspect of the breast at her prior biopsy sites. This showed: at the site of patient's prior biopsies at 1-2 o'clock, a 1.6 cm fluid collection consistent with a post biopsy seroma/hematoma; at lumpectomy site at 2 o'clock, scarring without a new mass or significant fluid  collection.  She also underwent abdominal and hepatic elastography ultrasound on 12/24/2019. This showed: probable fatty infiltration of liver; bilateral renal cortical atrophy with small renal cysts; median kPa: 5.9 kPa, compensated advanced chronic liver disease ruled out.  She has been also on Lyrica but has discontinued both because of issues regarding confusion and hot flashes.   REVIEW OF SYSTEMS: [name] had a great deal of difficulty with the letrozole.  Her husband also participated and he feels the quality of life was so bad under treatment with antiestrogens and she was so confused that he did not think they could take those drugs for any length of time.  Currently she is still having some hot flashes and is still somewhat confused.  It is difficult to get at baseline.  She just went off the Lyrica.  Detailed review of systems was otherwise stable.    HISTORY OF CURRENT ILLNESS: From the original intake note:  "[name]" had routine diagnostic mammography on 03/08/2018 showing breast density category D.  There was a 1.5 cm irregular mass in the left upper outer quadrant posterior depth.  This was further imaged with ultrasonography on the same day.  The left axilla was sonographically benign.  No abnormalities were seen in the left axilla.  Accordingly on 03/08/2018 she proceeded to biopsy of the left breast area in question. The pathology from this procedure showed ([id number]): Invasive ductal carcinoma, grade I-II. HER-2 negative with the ratio being 1.61 and number per cell 3.30.  The tumor was estrogen receptor 100% positive and progesterone receptor 100% positive, both with strong staining intensity.  Ki67 was <1%.   On 03/18/2018, she underwent a bilateral breast MRI with and without contrast, showing the mass in question in a setting of 3.3 cm  non-masslike enhancement in the posterior aspect of the left upper-outer quadrant. in addition there was a 1.1 cm mass in the left upper-outer  quadrant located 2.1 cm anterior and lateral to the biopsy proven malignant.   Right breast showed two indeterminate 5 mm adjacent enhancing nodules in the upper-inner quadrant.   The patient's subsequent history is as detailed below.   PAST MEDICAL HISTORY: Past Medical History:  Diagnosis Date  . Arthritis    hands and knees  . Complication of anesthesia    aspiration pna; following a colonoscopy, pt requests head of bed elevated if possible.  . Constipation   . Cough   . Depression   . Dysrhythmia    RBBB on 06-06-17 ekg   . Elevated liver function tests   . Esophageal stricture   . Family history of breast cancer   . Gallstones   . Genetic testing 04/02/2018   STAT Breast panel with reflex to Multi-Cancer panel (83 genes) @ [facility] - No pathogenic mutations detected  . GERD (gastroesophageal reflux disease)   . Hiatal hernia   . History of kidney stones   . History of radiation therapy 07/09/2018- 08/03/18   Left Breast/ 40.05 Gy in 15 fractions. Left Breast boost 10 Gy in 5 fractions.   . Hyperlipemia   . Hypothyroidism   . Insomnia   . Malignant neoplasm of upper-outer quadrant of left female breast ([place])   . Nephrolithiasis   . Pneumonia 2015   aspirated after colonosopy  . Renal cyst     PAST SURGICAL HISTORY: Past Surgical History:  Procedure Laterality Date  . BREAST LUMPECTOMY WITH RADIOACTIVE SEED AND SENTINEL LYMPH NODE BIOPSY Left 04/17/2018   Procedure: LEFT BREAST LUMPECTOMY WITH BRACKETED RADIOACTIVE SEEDS AND SENTINEL LYMPH NODE BIOPSY;  Surgeon: Rolm Bookbinder, MD;  Location: [facility];  Service: General;  Laterality: Left;  . BREAST SURGERY Left    Lumpectomy  . CARDIOVASCULAR STRESS TEST  07/19/2006   EF 70%, NO EVIDENCE OF ISCHEMIA  . CHOLECYSTECTOMY  12/30/10  . COLONOSCOPY    . DILATION AND CURETTAGE OF UTERUS    . HYSTEROSCOPY WITH D & C N/A 06/07/2018   Procedure: DILATATION AND CURETTAGE /HYSTEROSCOPY;  Surgeon: [name], MD;  Location: [facility];  Service: Gynecology;  Laterality: N/A;  . inguinal herniography    . NASAL SINUS SURGERY    . RADIOACTIVE SEED GUIDED EXCISIONAL BREAST BIOPSY Right 04/17/2018   Procedure: RIGHT BREAST SEED GUIDED EXCISIONAL BIOPSY;  Surgeon: Rolm Bookbinder, MD;  Location: [facility];  Service: General;  Laterality: Right;  . RE-EXCISION OF BREAST LUMPECTOMY Left 05/15/2018   Procedure: RE-EXCISION OF LEFT BREAST LUMPECTOMY, ASPIRATION OF LEFT AXILLARY SEROMA;  Surgeon: Rolm Bookbinder, MD;  Location: [facility];  Service: General;  Laterality: Left;  . T & A    . TOOTH EXTRACTION    . TOTAL KNEE ARTHROPLASTY Left 09/11/2017   Procedure: LEFT TOTAL KNEE ARTHROPLASTY;  Surgeon: Gaynelle Arabian, MD;  Location: [facility];  Service: Orthopedics;  Laterality: Left;  with block  . US ECHOCARDIOGRAPHY  07/17/2006   EF 55-60%    FAMILY HISTORY Family History  Problem Relation Age of Onset  . Breast cancer Mother 36       deceased 24; TAH/BSO in 29s/50s  . Dementia Father        deceased 35  . Breast cancer Maternal Aunt 82       deceased 22s  . Colon cancer  Neg Hx   . Stomach cancer Neg Hx    Her father died of old age at 18 years old. Her mother had breast cancer at 78 and died at 74 years old. She will undergo genetic testing. Her maternal aunt had breast cancer at 53 years old. No family history of ovarian cancer. She had one brother and one sister.    GYNECOLOGIC HISTORY:  No LMP recorded. Patient is postmenopausal. Menarche: 74 years old [place] P 0 LMP: 1997 Contraceptive: 2 years HRT: Estrogen and progesterone for about 7 years  Hysterectomy? No   SOCIAL HISTORY:  She is a retired Futures trader. She has been married to [place], for 45 plus years. He worked in the [employer]. Together they adopted two children. One daughter, [name], 12 of [place], who is a Psychologist, sport and exercise for an OBGYN, and one son, [name], 36 of  [place], who owns a Marketing executive business. The patient has three grandchildren. She attends [employer].    ADVANCED DIRECTIVES: In place   HEALTH MAINTENANCE: Social History   Tobacco Use  . Smoking status: Never Smoker  . Smokeless tobacco: Never Used  Substance Use Topics  . Alcohol use: No  . Drug use: No    Colonoscopy: 2014/ [facility]  PAP: 2018  Bone density:  May 2014 at [facility] showed a T score of -0.9 normal   Allergies  Allergen Reactions  . Oxycontin [Oxycodone Hcl]     "it made me feel very high and hyper"   . Codeine Nausea And Vomiting    Current Outpatient Medications  Medication Sig Dispense Refill  . acetaminophen (TYLENOL) 500 MG tablet Take 500 mg by mouth every 6 (six) hours as needed for mild pain or headache.    Marland Kitchen azelastine (ASTELIN) 0.1 % nasal spray Place 1 spray into both nostrils 2 (two) times daily. Use in each nostril as directed    . BIOTIN PO Take 1 tablet by mouth daily.    . Cholecalciferol (VITAMIN D3) 1000 units CAPS Take 1 capsule by mouth 2 (two) times daily.     Marland Kitchen escitalopram (LEXAPRO) 10 MG tablet Take 15 mg by mouth daily.     Marland Kitchen esomeprazole (NEXIUM) 40 MG capsule Take 40 mg by mouth 2 (two) times daily before a meal.    . ezetimibe (ZETIA) 10 MG tablet Take 10 mg by mouth at bedtime.    . fluticasone (FLONASE) 50 MCG/ACT nasal spray Place 1 spray into both nostrils daily.    Marland Kitchen levothyroxine (SYNTHROID, LEVOTHROID) 50 MCG tablet Take 50 mcg by mouth at bedtime.    . Magnesium 250 MG TABS Take 1 tablet by mouth daily.     . naproxen sodium (ALEVE) 220 MG tablet Take 220 mg by mouth 2 (two) times daily as needed (pain).    Marland Kitchen OVER THE COUNTER MEDICATION Apply 1 application topically daily as needed (pain). Natural pain relief ointment    . Probiotic Product (PROBIOTIC ADVANCED PO) Take 1 tablet by mouth daily.    . rosuvastatin (CRESTOR) 5 MG tablet Take 5 mg by mouth at bedtime.     . sodium chloride (OCEAN) 0.65 % SOLN  nasal spray Place 4 sprays into both nostrils 4 (four) times daily.    . SUPER B COMPLEX/C PO Take 1 tablet by mouth daily.    Marland Kitchen zolpidem (AMBIEN) 10 MG tablet zolpidem 10 mg tablet  take 1 tablet by mouth at bedtime if needed  No current facility-administered medications for this visit.    OBJECTIVE: Middle-aged white woman who took copious notes during our televisit today There were no vitals filed for this visit.   There is no height or weight on file to calculate BMI.   Wt Readings from Last 3 Encounters:  11/26/19 164 lb 6.4 oz (74.6 kg)  08/14/19 161 lb (73 kg)  06/24/19 161 lb 3.2 oz (73.1 kg)      ECOG FS:1 - Symptomatic but completely ambulatory  Telemedicine visit 01/02/2020.   LAB RESULTS:  CMP     Component Value Date/Time   NA 142 11/26/2019 1024   NA 144 06/06/2017 1448   K 4.3 11/26/2019 1024   CL 107 11/26/2019 1024   CO2 28 11/26/2019 1024   GLUCOSE 91 11/26/2019 1024   BUN 12 11/26/2019 1024   BUN 12 06/06/2017 1448   CREATININE 0.80 11/26/2019 1024   CALCIUM 9.2 11/26/2019 1024   PROT 6.6 11/26/2019 1024   PROT 6.3 06/06/2017 1448   ALBUMIN 4.1 11/26/2019 1024   ALBUMIN 4.4 06/06/2017 1448   AST 45 (H) 11/26/2019 1024   ALT 31 11/26/2019 1024   ALKPHOS 74 11/26/2019 1024   BILITOT 0.7 11/26/2019 1024   GFRNONAA >60 11/26/2019 1024   GFRAA >60 11/26/2019 1024    No results found for: TOTALPROTELP, ALBUMINELP, A1GS, A2GS, BETS, BETA2SER, GAMS, MSPIKE, SPEI  No results found for: KPAFRELGTCHN, LAMBDASER, KAPLAMBRATIO  Lab Results  Component Value Date   WBC 7.2 11/26/2019   NEUTROABS 2.5 11/26/2019   HGB 12.9 11/26/2019   HCT 40.1 11/26/2019   MCV 90.3 11/26/2019   PLT 190 11/26/2019   No results found for: LABCA2  No components found for: HALPFX902  No results for input(s): INR in the last 168 hours.  No results found for: LABCA2  No results found for: IOX735  No results found for: HGD924  No results found for: QAS341  No  results found for: CA2729  No components found for: HGQUANT  No results found for: CEA1 / No results found for: CEA1   No results found for: AFPTUMOR  No results found for: CHROMOGRNA  No results found for: PSA1  No visits with results within 3 Day(s) from this visit.  Latest known visit with results is:  Lab on 12/18/2019  Component Date Value Ref Range Status  . SARS-CoV-2, NAA 12/18/2019 Not Detected  Not Detected Final   Comment: This nucleic acid amplification test was developed and its performance characteristics determined by [employer], [employer] and [employer]. Nucleic acid amplification tests include PCR and TMA. This test has not been FDA cleared or approved. This test has been authorized by FDA under an Emergency Use Authorization (EUA). This test is only authorized for the duration of time the declaration that circumstances exist justifying the authorization of the emergency use of in vitro diagnostic tests for detection of SARS-CoV-2 virus and/or diagnosis of COVID-19 infection under section 564(b)(1) of the Act, 21 U.S.C. 962IWL-7(L) (1), unless the authorization is terminated or revoked sooner. When diagnostic testing is negative, the possibility of a false negative result should be considered in the context of a patient's recent exposures and the presence of clinical signs and symptoms consistent with COVID-19. An individual without symptoms of COVID-19 and who is not shedding SARS-CoV-2 virus would                           expect to have a negative (not detected)  result in this assay.     (this displays the last labs from the last 3 days)  No results found for: TOTALPROTELP, ALBUMINELP, A1GS, A2GS, BETS, BETA2SER, GAMS, MSPIKE, SPEI (this displays SPEP labs)  No results found for: KPAFRELGTCHN, LAMBDASER, KAPLAMBRATIO (kappa/lambda light chains)  No results found for: HGBA, HGBA2QUANT, HGBFQUANT, HGBSQUAN (Hemoglobinopathy evaluation)   No results found for:  LDH  No results found for: IRON, TIBC, IRONPCTSAT (Iron and TIBC)  No results found for: FERRITIN  Urinalysis    Component Value Date/Time   COLORURINE YELLOW 08/22/2016 1947   APPEARANCEUR CLEAR 08/22/2016 1947   LABSPEC 1.026 08/22/2016 1947   PHURINE 5.5 08/22/2016 1947   GLUCOSEU NEGATIVE 08/22/2016 1947   HGBUR SMALL (A) 08/22/2016 1947   BILIRUBINUR NEGATIVE 08/22/2016 1947   KETONESUR NEGATIVE 08/22/2016 1947   PROTEINUR NEGATIVE 08/22/2016 1947   UROBILINOGEN 0.2 08/22/2016 1622   NITRITE NEGATIVE 08/22/2016 1947   LEUKOCYTESUR SMALL (A) 08/22/2016 1947    STUDIES: US BREAST LTD UNI LEFT INC AXILLA  Result Date: 12/18/2019 CLINICAL DATA:  74 year old female with history of left breast cancer in April 2019 status post lumpectomy and radiation. Patient underwent MRI guided breast biopsy x 2 of the left breast in June 2020 demonstrating benign pathology. The patient reports intermittent pain at the lumpectomy site and now also pain in the periareolar/superior aspect of the breast at her MRI biopsy sites. EXAM: ULTRASOUND OF THE LEFT BREAST COMPARISON:  Previous exam(s). FINDINGS: On physical exam, I do not palpate a fixed discrete mass at the lumpectomy site or in the superior aspect of the left breast. Targeted ultrasound is performed in the retroareolar left breast demonstrating no discrete cystic or solid mass. No dilated duct or other abnormality. Ultrasound is performed in the left breast at the 12 to 1 o'clock position approximately 3 cm from the nipple at the sites of the patient's prior MRI guided biopsies, which corresponds to the site of patient's pain. There are post biopsy changes and a small oval circumscribed fluid collection measuring 1.6 x 0.5 x 0.7 cm, consistent with post biopsy seroma/hematoma. Targeted ultrasound is performed at the lumpectomy site in the left breast at 2 o'clock 10 cm from the nipple demonstrating an elongated scar measuring 4.7 cm with trace fluid  and no focal mass. IMPRESSION: 1. At the site of the patient's MRI guided biopsies at 1-2 o'clock in the left breast, which corresponds to the site of the patient's newly reported pain, there are biopsy site changes and a 1.6 cm fluid collection consistent with a post biopsy seroma/hematoma. 2. At the patient's lumpectomy site in the left breast at 2 o'clock there is scarring without a new mass or significant fluid collection. 3.  No sonographic abnormality in the left retroareolar breast. RECOMMENDATION: 1.  Routine post lumpectomy annual exam will be due in March 2021. 2.  Clinical follow-up as needed for the left breast pain. I have discussed the findings and recommendations with the patient. If applicable, a reminder letter will be sent to the patient regarding the next appointment. BI-RADS CATEGORY  2: Benign. Electronically Signed   By: Audie Pinto M.D.   On: 12/18/2019 14:02   US ABDOMEN COMPLETE W/ELASTOGRAPHY  Result Date: 12/24/2019 CLINICAL DATA:  Elevated transaminases, GERD, hiatal hernia, hyperlipidemia, breast cancer, cholelithiasis EXAM: ULTRASOUND ABDOMEN ULTRASOUND HEPATIC ELASTOGRAPHY TECHNIQUE: Sonography of the upper abdomen was performed. In addition, ultrasound elastography evaluation of the liver was performed. A region of interest was placed within the right  lobe of the liver. Following application of a compressive sonographic pulse, tissue compressibility was assessed. Multiple assessments were performed at the selected site. Median tissue compressibility was determined. Previously, hepatic stiffness was assessed by shear wave velocity. Based on recently published Society of Radiologists in Ultrasound consensus article, reporting is now recommended to be performed in the SI units of pressure (kiloPascals) representing hepatic stiffness/elasticity. The obtained result is compared to the published reference standards. (cACLD = compensated Advanced Chronic Liver Disease) COMPARISON:   11/17/2010 FINDINGS: ULTRASOUND ABDOMEN Gallbladder: Surgically absent Common bile duct: Diameter: 8 mm, normal Liver: Slightly coarsened increased echogenicity. No mass or nodularity. Portal vein is patent on color Doppler imaging with normal direction of blood flow towards the liver. IVC: Normal appearance Pancreas: Normal appearance Spleen: Normal appearance, 8.4 cm length Right Kidney: Length: 11.5 cm. Cortical thinning. Normal cortical echogenicity. Small simple cyst at inferior kidney 2.5 x 2.2 x 2.5 cm. No additional mass or hydronephrosis. Left Kidney: Length: 11.7 cm. Cortical thinning. Normal cortical echogenicity. Small simple mid renal cyst 2.1 x 1.6 x 1.6 cm. No additional mass or hydronephrosis Abdominal aorta: Normal caliber Other findings: No free fluid ULTRASOUND HEPATIC ELASTOGRAPHY Device: Siemens Helix VTQ Patient position: Supine Transducer 5C1 Number of measurements: 10 Hepatic segment:  8 Median kPa: 5.9 kPa IQR: 1.0 IQR/Median kPa ratio: 0.17 Data quality:  Good Diagnostic category: ?9 kPa: in the absence of other known clinical signs, rules out cACLD IMPRESSION: ULTRASOUND ABDOMEN: Probable fatty infiltration of liver. BILATERAL renal cortical atrophy with small renal cysts. Post cholecystectomy. ULTRASOUND HEPATIC ELASTOGRAPHY: Median kPa: 5.9 kPa Diagnostic category: ?9 kPa: in the absence of other known clinical signs, rules out cACLD The use of hepatic elastography is applicable to patients with viral hepatitis and non-alcoholic fatty liver disease. At this time, there is insufficient data for the referenced cut-off values and use in other causes of liver disease, including alcoholic liver disease. Patients, however, may be assessed by elastography and serve as their own reference standard/baseline. In patients with non-alcoholic liver disease, the values suggesting compensated advanced chronic liver disease (cACLD) may be lower, and patients may need additional testing with elasticity  results of 7-9 kPa. Please note that abnormal hepatic elasticity and shear wave velocities may also be identified in clinical settings other than with hepatic fibrosis, such as: acute hepatitis, elevated right heart and central venous pressures including use of beta blockers, veno-occlusive disease (Budd-Chiari), infiltrative processes such as mastocytosis/amyloidosis/infiltrative tumor/lymphoma, extrahepatic cholestasis, with hyperemia in the post-prandial state, and with liver transplantation. Correlation with patient history, laboratory data, and clinical condition recommended. Diagnostic Categories: ?5 kPa: high probability of being normal ?9 kPa: in the absence of other known clinical signs, rules out cACLD >9 kPa and ?13 kPa: suggestive of cACLD, but needs further testing >13 kPa: highly suggestive of cACLD ?17 kPa: highly suggestive of cACLD with an increased probability of clinically significant portal hypertension Electronically Signed   By: Lavonia Dana M.D.   On: 12/24/2019 12:52     ASSESSMENT: 74 y.o. [place] woman status post left breast upper outer quadrant biopsy 03/08/2018 for a clinical T1c-T2 N0, stage I invasive ductal carcinoma, grade 1 or 2, estrogen and progesterone receptor positive, HER-2 not amplified, with an MIB-1 of less than 1%  (1) additional biopsies as follows:  (a) right breast biopsy ([id number]) on 04/02/2018 showed atypical lobular hyperplasia  (b) left breast ([id number]) on 03/22/2018 showed invasive lobular carcinoma, grade 2, estrogen and progesterone receptor positive with an MIB-1 of 5%.  HER-2 not amplified  (2) bilateral lumpectomies and left sentinel lymph node sampling 04/17/2018 found  (a) on the right side, atypical lobular hyperplasia  (b) on the left side, an mpT1c pN0, stage IA, grade 2 invasive lobular carcinoma, with positive margins  (c) additional left breast surgery on 05/15/2018 cleared the compromise margins  (3) The Oncotype DX score was  14, predicting a risk of outside the breast recurrence over the next 9 years of 4% if the patient's only systemic therapy is tamoxifen for 5 years.  It also predicts no significant benefit from chemotherapy.  (4) adjuvant radiation 07/09/2018 - 08/03/2018 Site/dose:    1. Left Breast / 40.05 Gy in 15 fractions 2. Left Breast Boost / 10 Gy in 5 fractions  (5) started tamoxifen 09/11/2018, discontinued June 2020 with cognitive issues   (6) genetics testing 03/22/2018 through [facility]'s STAT Breast panel with reflex to Multi-Cancer panel found no pathogenic mutations in (ALK, APC, ATM, AXIN2, BAP1, BARD1, BLM, BMPR1A, BRCA1, BRCA2, BRIP1, CASR, CDC73, CDH1, CDK4, CDKN1B, CDKN1C, CDKN2A, CEBPA, CHEK2, CTNNA1, DICER1, DIS3L2, EGFR, EPCAM, FH, FLCN, GATA2, GPC3, GREM1, HOXB13, HRAS, KIT, MAX, MEN1, MET, MITF, MLH1, MSH2, MSH3, MSH6, MUTYH, NBN, NF1, NF2, NTHL1, PALB2, PDGFRA, PHOX2B, PMS2, POLD1, POLE, POT1, PRKAR1A, PTCH1, PTEN, RAD50, RAD51C, RAD51D, RB1, RECQL4, RET, RUNX1, SDHA, SDHAF2, SDHB, SDHC, SDHD, SMAD4, SMARCA4, SMARCB1, SMARCE1, STK11, SUFU, TERC, TERT, TMEM127, TP53, TSC1, TSC2, VHL, WRN, WT1).  (a) Variants of Uncertain Significance were found in in PDGFRA and RECQL4.  (7) started letrozole 09/26/2019, discontinued 11/26/2019 with cognitive issues  (a) bone density May 2014 normal, T score -0.9   PLAN: [name] continues to have problems with confusion and also flashes.  However she has been off the letrozole for a month.  The letrozole is out of her system.  It could be that hot flashes and confusion are the real baseline.  I think we need to determine this before we consider going back on any medication.  Accordingly we are not resuming letrozole and she of course does not want to go back to Lyrica either.  Instead she should take notes daily on how she is doing in terms of hot flashes and she and her husband should try to get a baseline regarding the chronic cognitive deficiencies.  We  can then chat in a couple of weeks and if they feel they have a handle on the baseline and want to try letrozole I suggested that she could try the medication on Mondays Wednesdays and Fridays only.  We could even do it once a week for a couple of weeks and then see if that makes any difference.  Alternatively, she could be observed off treatment.  We could institute intensified screening if that were the case.    I have left it that we will talk in a couple of weeks and suggest that she call me to report.  I am tentatively putting her in for another virtual visit in 3 weeks in case the phone call falls through.  [name], Virgie Dad, MD  01/02/20 6:07 PM Medical Oncology and Hematology [facility] [place], [place] [address] Tel. [phone]    Fax. [phone]   I, Wilburn Mylar, am acting as scribe for Dr. Virgie Dad. Izen Petz.  I, Lurline Del MD, have reviewed the above documentation for accuracy and completeness, and I agree with the above.   *Total Encounter Time as defined by the Centers for Medicare and Medicaid Services includes, in  addition to the face-to-face time of a patient visit (documented in the note above) non-face-to-face time: obtaining and reviewing outside history, ordering and reviewing medications, tests or procedures, care coordination (communications with other health care professionals or caregivers) and documentation in the medical record.

## 2020-01-02 ENCOUNTER — Telehealth (HOSPITAL_BASED_OUTPATIENT_CLINIC_OR_DEPARTMENT_OTHER): Payer: Medicare Other | Admitting: Oncology

## 2020-01-02 DIAGNOSIS — C50412 Malignant neoplasm of upper-outer quadrant of left female breast: Secondary | ICD-10-CM

## 2020-01-02 DIAGNOSIS — Z170 Estrogen receptor positive status [ER+]: Secondary | ICD-10-CM

## 2020-01-03 ENCOUNTER — Telehealth: Payer: Self-pay | Admitting: Oncology

## 2020-01-03 NOTE — Telephone Encounter (Signed)
I left a message regarding video visit  °

## 2020-01-10 ENCOUNTER — Ambulatory Visit: Payer: Medicare Other

## 2020-01-10 DIAGNOSIS — Z23 Encounter for immunization: Secondary | ICD-10-CM | POA: Diagnosis not present

## 2020-01-13 ENCOUNTER — Other Ambulatory Visit: Payer: Self-pay | Admitting: General Surgery

## 2020-01-13 DIAGNOSIS — Z9889 Other specified postprocedural states: Secondary | ICD-10-CM

## 2020-01-15 ENCOUNTER — Ambulatory Visit: Payer: Medicare Other | Admitting: Physical Therapy

## 2020-01-21 NOTE — Progress Notes (Signed)
[place]  Telephone:([phone]) [phone] Fax:(336) [phone]     ID: [name] DOB: 12/03/1946  MR#: [id number]  MWU#:[id number]  Patient Care Team: Shon Baton, MD as PCP - General Rolm Bookbinder, MD as Consulting Physician (General Surgery) Kiyan Burmester, Virgie Dad, MD as Consulting Physician (Oncology) Eppie Gibson, MD as Attending Physician (Radiation Oncology) Lelon Perla, MD as Consulting Physician (Cardiology) [name], MD as Consulting Physician (Obstetrics and Gynecology) Jacquelynn Cree, PT as Physical Therapist (Physical Therapy) OTHER MD:  I connected with [name] on 01/22/20 at  9:00 AM EST by telephone visit and verified that I am speaking with the correct person using two identifiers.   I discussed the limitations, risks, security and privacy concerns of performing an evaluation and management service by telemedicine and the availability of in-person appointments. I also discussed with the patient that there may be a patient responsible charge related to this service. The patient expressed understanding and agreed to proceed.   Other persons participating in the visit and their role in the encounter: None  Patient's location: home  Provider's location: [place]: Estrogen receptor positive breast cancer  CURRENT TREATMENT: Observation   INTERVAL HISTORY: [name] was contacted today for follow-up of her estrogen receptor positive breast cancer.  We had set up a video visit but she did not feel up to it and basically had a phone visit today  Since the last visit she had hepatic elastography which is suggestive of steatosis.  The question for [name] and her husband is how is her cognitive dysfunction doing all that she has been off both letrozole and Lyrica.  She tells me she is still having 8-12 hot flashes a day.  Going off letrozole did not make a difference regarding that.  In addition she  thinks her cognitive issues are no better.  The example she gave me is that she will watch a program with her husband and later on he will say he is to follow-up and she will not recall having seen the earlier program.  In short she really does not think things are any better off the medications and "this is just the way it is going to be".   REVIEW OF SYSTEMS: [name] has already had her 1st Covid vaccine shot, which she received in [place]. She will have the 2nd 1 later this month.  A detailed review of systems today was otherwise stable    HISTORY OF CURRENT ILLNESS: From the original intake note:  "[name]" had routine diagnostic mammography on 03/08/2018 showing breast density category D.  There was a 1.5 cm irregular mass in the left upper outer quadrant posterior depth.  This was further imaged with ultrasonography on the same day.  The left axilla was sonographically benign.  No abnormalities were seen in the left axilla.  Accordingly on 03/08/2018 she proceeded to biopsy of the left breast area in question. The pathology from this procedure showed ([id number]): Invasive ductal carcinoma, grade I-II. HER-2 negative with the ratio being 1.61 and number per cell 3.30.  The tumor was estrogen receptor 100% positive and progesterone receptor 100% positive, both with strong staining intensity.  Ki67 was <1%.   On 03/18/2018, she underwent a bilateral breast MRI with and without contrast, showing the mass in question in a setting of 3.3 cm non-masslike enhancement in the posterior aspect of the left upper-outer quadrant. in addition there was a 1.1 cm mass in the left  upper-outer quadrant located 2.1 cm anterior and lateral to the biopsy proven malignant.   Right breast showed two indeterminate 5 mm adjacent enhancing nodules in the upper-inner quadrant.   The patient's subsequent history is as detailed below.   PAST MEDICAL HISTORY: Past Medical History:  Diagnosis Date  . Arthritis    hands  and knees  . Complication of anesthesia    aspiration pna; following a colonoscopy, pt requests head of bed elevated if possible.  . Constipation   . Cough   . Depression   . Dysrhythmia    RBBB on 06-06-17 ekg   . Elevated liver function tests   . Esophageal stricture   . Family history of breast cancer   . Gallstones   . Genetic testing 04/02/2018   STAT Breast panel with reflex to Multi-Cancer panel (83 genes) @ [facility] - No pathogenic mutations detected  . GERD (gastroesophageal reflux disease)   . Hiatal hernia   . History of kidney stones   . History of radiation therapy 07/09/2018- 08/03/18   Left Breast/ 40.05 Gy in 15 fractions. Left Breast boost 10 Gy in 5 fractions.   . Hyperlipemia   . Hypothyroidism   . Insomnia   . Malignant neoplasm of upper-outer quadrant of left female breast ([place])   . Nephrolithiasis   . Pneumonia 2015   aspirated after colonosopy  . Renal cyst     PAST SURGICAL HISTORY: Past Surgical History:  Procedure Laterality Date  . BREAST LUMPECTOMY WITH RADIOACTIVE SEED AND SENTINEL LYMPH NODE BIOPSY Left 04/17/2018   Procedure: LEFT BREAST LUMPECTOMY WITH BRACKETED RADIOACTIVE SEEDS AND SENTINEL LYMPH NODE BIOPSY;  Surgeon: Rolm Bookbinder, MD;  Location: [facility];  Service: General;  Laterality: Left;  . BREAST SURGERY Left    Lumpectomy  . CARDIOVASCULAR STRESS TEST  07/19/2006   EF 70%, NO EVIDENCE OF ISCHEMIA  . CHOLECYSTECTOMY  12/30/10  . COLONOSCOPY    . DILATION AND CURETTAGE OF UTERUS    . HYSTEROSCOPY WITH D & C N/A 06/07/2018   Procedure: DILATATION AND CURETTAGE /HYSTEROSCOPY;  Surgeon: [name], MD;  Location: [facility];  Service: Gynecology;  Laterality: N/A;  . inguinal herniography    . NASAL SINUS SURGERY    . RADIOACTIVE SEED GUIDED EXCISIONAL BREAST BIOPSY Right 04/17/2018   Procedure: RIGHT BREAST SEED GUIDED EXCISIONAL BIOPSY;  Surgeon: Rolm Bookbinder, MD;  Location: [facility];  Service:  General;  Laterality: Right;  . RE-EXCISION OF BREAST LUMPECTOMY Left 05/15/2018   Procedure: RE-EXCISION OF LEFT BREAST LUMPECTOMY, ASPIRATION OF LEFT AXILLARY SEROMA;  Surgeon: Rolm Bookbinder, MD;  Location: [facility];  Service: General;  Laterality: Left;  . T & A    . TOOTH EXTRACTION    . TOTAL KNEE ARTHROPLASTY Left 09/11/2017   Procedure: LEFT TOTAL KNEE ARTHROPLASTY;  Surgeon: Gaynelle Arabian, MD;  Location: [facility];  Service: Orthopedics;  Laterality: Left;  with block  . US ECHOCARDIOGRAPHY  07/17/2006   EF 55-60%    FAMILY HISTORY Family History  Problem Relation Age of Onset  . Breast cancer Mother 36       deceased 72; TAH/BSO in 53s/50s  . Dementia Father        deceased 77  . Breast cancer Maternal Aunt 82       deceased 72s  . Colon cancer Neg Hx   . Stomach cancer Neg Hx    Her father died of old age at 32 years old.  Her mother had breast cancer at 80 and died at 74 years old. She will undergo genetic testing. Her maternal aunt had breast cancer at 54 years old. No family history of ovarian cancer. She had one brother and one sister.    GYNECOLOGIC HISTORY:  No LMP recorded. Patient is postmenopausal. Menarche: 74 years old [place] P 0 LMP: 1997 Contraceptive: 2 years HRT: Estrogen and progesterone for about 7 years  Hysterectomy? No   SOCIAL HISTORY:  She is a retired Futures trader. She has been married to Tyrone, for 45 plus years. He worked in the [employer]. Together they adopted two children. One daughter, [name], 50 of [place], who is a Psychologist, sport and exercise for an OBGYN, and one son, Khaya Theissen, 36 of [place], who owns a Marketing executive business. The patient has three grandchildren. She attends [employer].    ADVANCED DIRECTIVES: In place   HEALTH MAINTENANCE: Social History   Tobacco Use  . Smoking status: Never Smoker  . Smokeless tobacco: Never Used  Substance Use Topics  . Alcohol use: No  .  Drug use: No    Colonoscopy: 2014/ [facility]  PAP: 2018  Bone density:  May 2014 at [facility] showed a T score of -0.9 normal   Allergies  Allergen Reactions  . Oxycontin [Oxycodone Hcl]     "it made me feel very high and hyper"   . Codeine Nausea And Vomiting    Current Outpatient Medications  Medication Sig Dispense Refill  . acetaminophen (TYLENOL) 500 MG tablet Take 500 mg by mouth every 6 (six) hours as needed for mild pain or headache.    Marland Kitchen azelastine (ASTELIN) 0.1 % nasal spray Place 1 spray into both nostrils 2 (two) times daily. Use in each nostril as directed    . BIOTIN PO Take 1 tablet by mouth daily.    . Cholecalciferol (VITAMIN D3) 1000 units CAPS Take 1 capsule by mouth 2 (two) times daily.     Marland Kitchen escitalopram (LEXAPRO) 10 MG tablet Take 15 mg by mouth daily.     Marland Kitchen esomeprazole (NEXIUM) 40 MG capsule Take 40 mg by mouth 2 (two) times daily before a meal.    . ezetimibe (ZETIA) 10 MG tablet Take 10 mg by mouth at bedtime.    . fluticasone (FLONASE) 50 MCG/ACT nasal spray Place 1 spray into both nostrils daily.    Marland Kitchen levothyroxine (SYNTHROID, LEVOTHROID) 50 MCG tablet Take 50 mcg by mouth at bedtime.    . Magnesium 250 MG TABS Take 1 tablet by mouth daily.     . naproxen sodium (ALEVE) 220 MG tablet Take 220 mg by mouth 2 (two) times daily as needed (pain).    Marland Kitchen OVER THE COUNTER MEDICATION Apply 1 application topically daily as needed (pain). Natural pain relief ointment    . Probiotic Product (PROBIOTIC ADVANCED PO) Take 1 tablet by mouth daily.    . rosuvastatin (CRESTOR) 5 MG tablet Take 5 mg by mouth at bedtime.     . sodium chloride (OCEAN) 0.65 % SOLN nasal spray Place 4 sprays into both nostrils 4 (four) times daily.    . SUPER B COMPLEX/C PO Take 1 tablet by mouth daily.    Marland Kitchen zolpidem (AMBIEN) 10 MG tablet zolpidem 10 mg tablet  take 1 tablet by mouth at bedtime if needed     No current facility-administered medications for this visit.    OBJECTIVE: Middle-aged white  woman  There were no vitals filed for this  visit.   There is no height or weight on file to calculate BMI.   Wt Readings from Last 3 Encounters:  11/26/19 164 lb 6.4 oz (74.6 kg)  08/14/19 161 lb (73 kg)  06/24/19 161 lb 3.2 oz (73.1 kg)      ECOG FS:1 - Symptomatic but completely ambulatory  Telemedicine visit 01/22/2020.   LAB RESULTS:  CMP     Component Value Date/Time   NA 142 11/26/2019 1024   NA 144 06/06/2017 1448   K 4.3 11/26/2019 1024   CL 107 11/26/2019 1024   CO2 28 11/26/2019 1024   GLUCOSE 91 11/26/2019 1024   BUN 12 11/26/2019 1024   BUN 12 06/06/2017 1448   CREATININE 0.80 11/26/2019 1024   CALCIUM 9.2 11/26/2019 1024   PROT 6.6 11/26/2019 1024   PROT 6.3 06/06/2017 1448   ALBUMIN 4.1 11/26/2019 1024   ALBUMIN 4.4 06/06/2017 1448   AST 45 (H) 11/26/2019 1024   ALT 31 11/26/2019 1024   ALKPHOS 74 11/26/2019 1024   BILITOT 0.7 11/26/2019 1024   GFRNONAA >60 11/26/2019 1024   GFRAA >60 11/26/2019 1024    No results found for: TOTALPROTELP, ALBUMINELP, A1GS, A2GS, BETS, BETA2SER, GAMS, MSPIKE, SPEI  No results found for: KPAFRELGTCHN, LAMBDASER, KAPLAMBRATIO  Lab Results  Component Value Date   WBC 7.2 11/26/2019   NEUTROABS 2.5 11/26/2019   HGB 12.9 11/26/2019   HCT 40.1 11/26/2019   MCV 90.3 11/26/2019   PLT 190 11/26/2019   No results found for: LABCA2  No components found for: HXTAVW979  No results for input(s): INR in the last 168 hours.  No results found for: LABCA2  No results found for: YIA165  No results found for: VVZ482  No results found for: LMB867  No results found for: CA2729  No components found for: HGQUANT  No results found for: CEA1 / No results found for: CEA1   No results found for: AFPTUMOR  No results found for: CHROMOGRNA  No results found for: HGBA, HGBA2QUANT, HGBFQUANT, HGBSQUAN (Hemoglobinopathy evaluation)   No results found for: LDH  No results found for: IRON, TIBC, IRONPCTSAT (Iron and  TIBC)  No results found for: FERRITIN  Urinalysis    Component Value Date/Time   COLORURINE YELLOW 08/22/2016 1947   APPEARANCEUR CLEAR 08/22/2016 1947   LABSPEC 1.026 08/22/2016 1947   PHURINE 5.5 08/22/2016 1947   GLUCOSEU NEGATIVE 08/22/2016 1947   HGBUR SMALL (A) 08/22/2016 1947   BILIRUBINUR NEGATIVE 08/22/2016 1947   KETONESUR NEGATIVE 08/22/2016 1947   PROTEINUR NEGATIVE 08/22/2016 1947   UROBILINOGEN 0.2 08/22/2016 1622   NITRITE NEGATIVE 08/22/2016 1947   LEUKOCYTESUR SMALL (A) 08/22/2016 1947    STUDIES: US ABDOMEN COMPLETE W/ELASTOGRAPHY  Result Date: 12/24/2019 CLINICAL DATA:  Elevated transaminases, GERD, hiatal hernia, hyperlipidemia, breast cancer, cholelithiasis EXAM: ULTRASOUND ABDOMEN ULTRASOUND HEPATIC ELASTOGRAPHY TECHNIQUE: Sonography of the upper abdomen was performed. In addition, ultrasound elastography evaluation of the liver was performed. A region of interest was placed within the right lobe of the liver. Following application of a compressive sonographic pulse, tissue compressibility was assessed. Multiple assessments were performed at the selected site. Median tissue compressibility was determined. Previously, hepatic stiffness was assessed by shear wave velocity. Based on recently published Society of Radiologists in Ultrasound consensus article, reporting is now recommended to be performed in the SI units of pressure (kiloPascals) representing hepatic stiffness/elasticity. The obtained result is compared to the published reference standards. (cACLD = compensated Advanced Chronic Liver Disease) COMPARISON:  11/17/2010  FINDINGS: ULTRASOUND ABDOMEN Gallbladder: Surgically absent Common bile duct: Diameter: 8 mm, normal Liver: Slightly coarsened increased echogenicity. No mass or nodularity. Portal vein is patent on color Doppler imaging with normal direction of blood flow towards the liver. IVC: Normal appearance Pancreas: Normal appearance Spleen: Normal  appearance, 8.4 cm length Right Kidney: Length: 11.5 cm. Cortical thinning. Normal cortical echogenicity. Small simple cyst at inferior kidney 2.5 x 2.2 x 2.5 cm. No additional mass or hydronephrosis. Left Kidney: Length: 11.7 cm. Cortical thinning. Normal cortical echogenicity. Small simple mid renal cyst 2.1 x 1.6 x 1.6 cm. No additional mass or hydronephrosis Abdominal aorta: Normal caliber Other findings: No free fluid ULTRASOUND HEPATIC ELASTOGRAPHY Device: Siemens Helix VTQ Patient position: Supine Transducer 5C1 Number of measurements: 10 Hepatic segment:  8 Median kPa: 5.9 kPa IQR: 1.0 IQR/Median kPa ratio: 0.17 Data quality:  Good Diagnostic category: ?9 kPa: in the absence of other known clinical signs, rules out cACLD IMPRESSION: ULTRASOUND ABDOMEN: Probable fatty infiltration of liver. BILATERAL renal cortical atrophy with small renal cysts. Post cholecystectomy. ULTRASOUND HEPATIC ELASTOGRAPHY: Median kPa: 5.9 kPa Diagnostic category: ?9 kPa: in the absence of other known clinical signs, rules out cACLD The use of hepatic elastography is applicable to patients with viral hepatitis and non-alcoholic fatty liver disease. At this time, there is insufficient data for the referenced cut-off values and use in other causes of liver disease, including alcoholic liver disease. Patients, however, may be assessed by elastography and serve as their own reference standard/baseline. In patients with non-alcoholic liver disease, the values suggesting compensated advanced chronic liver disease (cACLD) may be lower, and patients may need additional testing with elasticity results of 7-9 kPa. Please note that abnormal hepatic elasticity and shear wave velocities may also be identified in clinical settings other than with hepatic fibrosis, such as: acute hepatitis, elevated right heart and central venous pressures including use of beta blockers, veno-occlusive disease (Budd-Chiari), infiltrative processes such as  mastocytosis/amyloidosis/infiltrative tumor/lymphoma, extrahepatic cholestasis, with hyperemia in the post-prandial state, and with liver transplantation. Correlation with patient history, laboratory data, and clinical condition recommended. Diagnostic Categories: ?5 kPa: high probability of being normal ?9 kPa: in the absence of other known clinical signs, rules out cACLD >9 kPa and ?13 kPa: suggestive of cACLD, but needs further testing >13 kPa: highly suggestive of cACLD ?17 kPa: highly suggestive of cACLD with an increased probability of clinically significant portal hypertension Electronically Signed   By: Lavonia Dana M.D.   On: 12/24/2019 12:52     ASSESSMENT: 74 y.o. [place] woman status post left breast upper outer quadrant biopsy 03/08/2018 for a clinical T1c-T2 N0, stage I invasive ductal carcinoma, grade 1 or 2, estrogen and progesterone receptor positive, HER-2 not amplified, with an MIB-1 of less than 1%  (1) additional biopsies as follows:  (a) right breast biopsy ([id number]) on 04/02/2018 showed atypical lobular hyperplasia  (b) left breast ([id number]) on 03/22/2018 showed invasive lobular carcinoma, grade 2, estrogen and progesterone receptor positive with an MIB-1 of 5%. HER-2 not amplified  (2) bilateral lumpectomies and left sentinel lymph node sampling 04/17/2018 found  (a) on the right side, atypical lobular hyperplasia  (b) on the left side, an mpT1c pN0, stage IA, grade 2 invasive lobular carcinoma, with positive margins  (c) additional left breast surgery on 05/15/2018 cleared the compromise margins  (3) The Oncotype DX score was 14, predicting a risk of outside the breast recurrence over the next 9 years of 4% if the patient's only systemic therapy is  tamoxifen for 5 years.  It also predicts no significant benefit from chemotherapy.  (4) adjuvant radiation 07/09/2018 - 08/03/2018 Site/dose:    1. Left Breast / 40.05 Gy in 15 fractions 2. Left Breast Boost / 10 Gy  in 5 fractions  (5) started tamoxifen 09/11/2018, discontinued June 2020 with cognitive issues   (6) genetics testing 03/22/2018 through [facility]'s STAT Breast panel with reflex to Multi-Cancer panel found no pathogenic mutations in (ALK, APC, ATM, AXIN2, BAP1, BARD1, BLM, BMPR1A, BRCA1, BRCA2, BRIP1, CASR, CDC73, CDH1, CDK4, CDKN1B, CDKN1C, CDKN2A, CEBPA, CHEK2, CTNNA1, DICER1, DIS3L2, EGFR, EPCAM, FH, FLCN, GATA2, GPC3, GREM1, HOXB13, HRAS, KIT, MAX, MEN1, MET, MITF, MLH1, MSH2, MSH3, MSH6, MUTYH, NBN, NF1, NF2, NTHL1, PALB2, PDGFRA, PHOX2B, PMS2, POLD1, POLE, POT1, PRKAR1A, PTCH1, PTEN, RAD50, RAD51C, RAD51D, RB1, RECQL4, RET, RUNX1, SDHA, SDHAF2, SDHB, SDHC, SDHD, SMAD4, SMARCA4, SMARCB1, SMARCE1, STK11, SUFU, TERC, TERT, TMEM127, TP53, TSC1, TSC2, VHL, WRN, WT1).  (a) Variants of Uncertain Significance were found in in PDGFRA and RECQL4.  (7) started letrozole 09/26/2019, discontinued 11/26/2019 with cognitive issues  (a) bone density May 2014 normal, T score -0.9  (b) letrozole resumed 2020-01-22, taken Mondays, Wednesday, Friday, as being off medication made no difference to either hot flashes or cognitive deficiencies   PLAN: [name] really is no better off letrozole (and off Lyrica) then on the medications.  I gave her the choice of going back on letrozole, perhaps at reduced intensity, and going on intensified screening.  The latter is safe but it is more expensive and of course can lead to unneeded biopsies and other interventions.  She was pretty clear that she really would prefer to just go back on the letrozole.  We will going to do this on Monday Wednesday and Friday.  She is going to keep notes on how things go and we will talk again by phone in about 6 weeks.  Williamson Cavanah, Virgie Dad, MD  01/22/20 8:54 AM Medical Oncology and Hematology [facility] [place], [place] [address] Tel. [phone]    Fax. [phone]   I, Wilburn Mylar, am acting as  scribe for Dr. Virgie Dad. Anastazja Isaac.  I, Lurline Del MD, have reviewed the above documentation for accuracy and completeness, and I agree with the above.   *Total Encounter Time as defined by the Centers for Medicare and Medicaid Services includes, in addition to the face-to-face time of a patient visit (documented in the note above) non-face-to-face time: obtaining and reviewing outside history, ordering and reviewing medications, tests or procedures, care coordination (communications with other health care professionals or caregivers) and documentation in the medical record.

## 2020-01-22 ENCOUNTER — Telehealth (HOSPITAL_BASED_OUTPATIENT_CLINIC_OR_DEPARTMENT_OTHER): Payer: Medicare Other | Admitting: Oncology

## 2020-01-22 DIAGNOSIS — Z170 Estrogen receptor positive status [ER+]: Secondary | ICD-10-CM | POA: Diagnosis not present

## 2020-01-22 DIAGNOSIS — K760 Fatty (change of) liver, not elsewhere classified: Secondary | ICD-10-CM | POA: Insufficient documentation

## 2020-01-22 DIAGNOSIS — C50412 Malignant neoplasm of upper-outer quadrant of left female breast: Secondary | ICD-10-CM

## 2020-01-23 ENCOUNTER — Telehealth: Payer: Self-pay | Admitting: Oncology

## 2020-01-23 NOTE — Telephone Encounter (Signed)
I talk with patient regarding phone visit  °

## 2020-01-31 ENCOUNTER — Ambulatory Visit: Payer: Medicare Other

## 2020-02-07 DIAGNOSIS — Z23 Encounter for immunization: Secondary | ICD-10-CM | POA: Diagnosis not present

## 2020-03-03 ENCOUNTER — Ambulatory Visit
Admission: RE | Admit: 2020-03-03 | Discharge: 2020-03-03 | Disposition: A | Discharge status: Home / Self Care | Payer: Medicare Other | Source: Ambulatory Visit | Attending: General Surgery | Admitting: General Surgery

## 2020-03-03 ENCOUNTER — Other Ambulatory Visit: Payer: Self-pay | Admitting: General Surgery

## 2020-03-03 ENCOUNTER — Other Ambulatory Visit: Payer: Self-pay

## 2020-03-03 DIAGNOSIS — Z9889 Other specified postprocedural states: Secondary | ICD-10-CM

## 2020-03-03 DIAGNOSIS — N6320 Unspecified lump in the left breast, unspecified quadrant: Secondary | ICD-10-CM

## 2020-03-03 DIAGNOSIS — N6489 Other specified disorders of breast: Secondary | ICD-10-CM | POA: Diagnosis not present

## 2020-03-03 DIAGNOSIS — R922 Inconclusive mammogram: Secondary | ICD-10-CM | POA: Diagnosis not present

## 2020-03-04 NOTE — Progress Notes (Signed)
[place]  Telephone:(336) [phone] Fax:(336) [phone]     ID: [name] DOB: 1946/07/28  MR#: [id number]  ULA#:[id number]  Patient Care Team: Shon Baton, MD as PCP - General Rolm Bookbinder, MD as Consulting Physician (General Surgery) Dyon Rotert, Virgie Dad, MD as Consulting Physician (Oncology) Eppie Gibson, MD as Attending Physician (Radiation Oncology) Lelon Perla, MD as Consulting Physician (Cardiology) [name], MD as Consulting Physician (Obstetrics and Gynecology) Jacquelynn Cree, PT as Physical Therapist (Physical Therapy) OTHER MD:  I connected with [name] on 03/05/20 at  1:00 PM EDT by telephone visit and verified that I am speaking with the correct person using two identifiers.   I discussed the limitations, risks, security and privacy concerns of performing an evaluation and management service by telemedicine and the availability of in-person appointments. I also discussed with the patient that there may be a patient responsible charge related to this service. The patient expressed understanding and agreed to proceed.   Other persons participating in the visit and their role in the encounter: Patient's husband [name]  Patient's location: home  Provider's location: [place]    CHIEF COMPLAINT: Estrogen receptor positive breast cancer  CURRENT TREATMENT: Observation   INTERVAL HISTORY: [name] was contacted today for follow-up of her estrogen receptor positive breast cancer.   Her letrozole was restarted on 01/22/2020 after there was no difference in her hot flashes or cognitive deficiencies without the medication. She is now taking this three days a week.  She says her mind is not quite where she would like it to be but she wonders if it is really the numerous surgeries that she has had recently and of course the entire stress of her diagnosis plus this whole year of Covid.  At this point though she feels  she can continue to take this medication on a 3 times a week basis.  Since her last visit, she presented ton Dr. Donne Hazel with increased pain in the outer portion of her left breast. Physical exam performed at [facility] showed tenderness along the lateral portion of the left breast and a discrete focal area of thickening without definitive mass at 1 o'clock in the left breast. She underwent bilateral diagnostic mammography with tomography and left breast ultrasonography on 03/03/2020 showing: breast density category D; indeterminate 3 mm hypoechoic area in the left breast at 1 o'clock; focal thickening at 1 o'clock without sonographic or mammographic correlate; indeterminate diffuse increased density of the left breast.  Breast MRI was recommended but has not been scheduled.. She is scheduled for biopsy of the left breast area in question on 03/20/2020.  She received her second Covid-19 vaccine dose on 02/07/2020.   REVIEW OF SYSTEMS: [name] is very reluctant to undergo breast MRI.  She is concerned about the contrast.  She had hematoma and bleeding issues as well as pain from the access.  Her husband [name] also wonders if she is simply going to continue to have changes in her scars--she scars easily--and is going to just continue to have endless series of biopsies.  Both of them have had both Covid vaccine shots and did well with those.    HISTORY OF CURRENT ILLNESS: From the original intake note:  "[name]" had routine diagnostic mammography on 03/08/2018 showing breast density category D.  There was a 1.5 cm irregular mass in the left upper outer quadrant posterior depth.  This was further imaged with ultrasonography on the same day.  The left  axilla was sonographically benign.  No abnormalities were seen in the left axilla.  Accordingly on 03/08/2018 she proceeded to biopsy of the left breast area in question. The pathology from this procedure showed ([id number]): Invasive ductal carcinoma,  grade I-II. HER-2 negative with the ratio being 1.61 and number per cell 3.30.  The tumor was estrogen receptor 100% positive and progesterone receptor 100% positive, both with strong staining intensity.  Ki67 was <1%.   On 03/18/2018, she underwent a bilateral breast MRI with and without contrast, showing the mass in question in a setting of 3.3 cm non-masslike enhancement in the posterior aspect of the left upper-outer quadrant. in addition there was a 1.1 cm mass in the left upper-outer quadrant located 2.1 cm anterior and lateral to the biopsy proven malignant.   Right breast showed two indeterminate 5 mm adjacent enhancing nodules in the upper-inner quadrant.   The patient's subsequent history is as detailed below.   PAST MEDICAL HISTORY: Past Medical History:  Diagnosis Date  . Arthritis    hands and knees  . Complication of anesthesia    aspiration pna; following a colonoscopy, pt requests head of bed elevated if possible.  . Constipation   . Cough   . Depression   . Dysrhythmia    RBBB on 06-06-17 ekg   . Elevated liver function tests   . Esophageal stricture   . Family history of breast cancer   . Gallstones   . Genetic testing 04/02/2018   STAT Breast panel with reflex to Multi-Cancer panel (83 genes) @ [facility] - No pathogenic mutations detected  . GERD (gastroesophageal reflux disease)   . Hiatal hernia   . History of kidney stones   . History of radiation therapy 07/09/2018- 08/03/18   Left Breast/ 40.05 Gy in 15 fractions. Left Breast boost 10 Gy in 5 fractions.   . Hyperlipemia   . Hypothyroidism   . Insomnia   . Malignant neoplasm of upper-outer quadrant of left female breast ([place])   . Nephrolithiasis   . Pneumonia 2015   aspirated after colonosopy  . Renal cyst     PAST SURGICAL HISTORY: Past Surgical History:  Procedure Laterality Date  . BREAST BIOPSY Left 05/31/2019   mri  . BREAST BIOPSY Left 04/2019   malignant  . BREAST LUMPECTOMY Left 04/17/2018    malignant  . BREAST LUMPECTOMY WITH RADIOACTIVE SEED AND SENTINEL LYMPH NODE BIOPSY Left 04/17/2018   Procedure: LEFT BREAST LUMPECTOMY WITH BRACKETED RADIOACTIVE SEEDS AND SENTINEL LYMPH NODE BIOPSY;  Surgeon: Rolm Bookbinder, MD;  Location: [facility];  Service: General;  Laterality: Left;  . BREAST SURGERY Left    Lumpectomy  . CARDIOVASCULAR STRESS TEST  07/19/2006   EF 70%, NO EVIDENCE OF ISCHEMIA  . CHOLECYSTECTOMY  12/30/10  . COLONOSCOPY    . DILATION AND CURETTAGE OF UTERUS    . HYSTEROSCOPY WITH D & C N/A 06/07/2018   Procedure: DILATATION AND CURETTAGE /HYSTEROSCOPY;  Surgeon: [name], MD;  Location: [facility];  Service: Gynecology;  Laterality: N/A;  . inguinal herniography    . NASAL SINUS SURGERY    . RADIOACTIVE SEED GUIDED EXCISIONAL BREAST BIOPSY Right 04/17/2018   Procedure: RIGHT BREAST SEED GUIDED EXCISIONAL BIOPSY;  Surgeon: Rolm Bookbinder, MD;  Location: [facility];  Service: General;  Laterality: Right;  . RE-EXCISION OF BREAST LUMPECTOMY Left 05/15/2018   Procedure: RE-EXCISION OF LEFT BREAST LUMPECTOMY, ASPIRATION OF LEFT AXILLARY SEROMA;  Surgeon: Rolm Bookbinder, MD;  Location: [facility];  Service: General;  Laterality: Left;  . T & A    . TOOTH EXTRACTION    . TOTAL KNEE ARTHROPLASTY Left 09/11/2017   Procedure: LEFT TOTAL KNEE ARTHROPLASTY;  Surgeon: Gaynelle Arabian, MD;  Location: [facility];  Service: Orthopedics;  Laterality: Left;  with block  . US ECHOCARDIOGRAPHY  07/17/2006   EF 55-60%    FAMILY HISTORY Family History  Problem Relation Age of Onset  . Breast cancer Mother 21       deceased 70; TAH/BSO in 73s/50s  . Dementia Father        deceased 62  . Breast cancer Maternal Aunt 82       deceased 68s  . Colon cancer Neg Hx   . Stomach cancer Neg Hx    Her father died of old age at 31 years old. Her mother had breast cancer at 20 and died at 74 years old. She will undergo genetic testing. Her  maternal aunt had breast cancer at 61 years old. No family history of ovarian cancer. She had one brother and one sister.    GYNECOLOGIC HISTORY:  No LMP recorded. Patient is postmenopausal. Menarche: 74 years old [place] P 0 LMP: 1997 Contraceptive: 2 years HRT: Estrogen and progesterone for about 7 years  Hysterectomy? No   SOCIAL HISTORY:  She is a retired Futures trader. She has been married to [place], for 45 plus years. He worked in the [employer]. Together they adopted two children. One daughter, [name], 69 of [place], who is a Psychologist, sport and exercise for an OBGYN, and one son, Littie Chiem, 36 of [place], who owns a Marketing executive business. The patient has three grandchildren. She attends [employer].    ADVANCED DIRECTIVES: In place   HEALTH MAINTENANCE: Social History   Tobacco Use  . Smoking status: Never Smoker  . Smokeless tobacco: Never Used  Substance Use Topics  . Alcohol use: No  . Drug use: No    Colonoscopy: 2014/ [facility]  PAP: 2018  Bone density:  May 2014 at [facility] showed a T score of -0.9 normal   Allergies  Allergen Reactions  . Oxycontin [Oxycodone Hcl]     "it made me feel very high and hyper"   . Codeine Nausea And Vomiting    Current Outpatient Medications  Medication Sig Dispense Refill  . acetaminophen (TYLENOL) 500 MG tablet Take 500 mg by mouth every 6 (six) hours as needed for mild pain or headache.    Marland Kitchen azelastine (ASTELIN) 0.1 % nasal spray Place 1 spray into both nostrils 2 (two) times daily. Use in each nostril as directed    . BIOTIN PO Take 1 tablet by mouth daily.    . Cholecalciferol (VITAMIN D3) 1000 units CAPS Take 1 capsule by mouth 2 (two) times daily.     Marland Kitchen escitalopram (LEXAPRO) 10 MG tablet Take 15 mg by mouth daily.     Marland Kitchen esomeprazole (NEXIUM) 40 MG capsule Take 40 mg by mouth 2 (two) times daily before a meal.    . ezetimibe (ZETIA) 10 MG tablet Take 10 mg by mouth at bedtime.    . fluticasone  (FLONASE) 50 MCG/ACT nasal spray Place 1 spray into both nostrils daily.    Marland Kitchen levothyroxine (SYNTHROID, LEVOTHROID) 50 MCG tablet Take 50 mcg by mouth at bedtime.    . Magnesium 250 MG TABS Take 1 tablet by mouth daily.     . naproxen sodium (ALEVE) 220 MG tablet Take  220 mg by mouth 2 (two) times daily as needed (pain).    Marland Kitchen OVER THE COUNTER MEDICATION Apply 1 application topically daily as needed (pain). Natural pain relief ointment    . Probiotic Product (PROBIOTIC ADVANCED PO) Take 1 tablet by mouth daily.    . rosuvastatin (CRESTOR) 5 MG tablet Take 5 mg by mouth at bedtime.     . sodium chloride (OCEAN) 0.65 % SOLN nasal spray Place 4 sprays into both nostrils 4 (four) times daily.    . SUPER B COMPLEX/C PO Take 1 tablet by mouth daily.    Marland Kitchen zolpidem (AMBIEN) 10 MG tablet zolpidem 10 mg tablet  take 1 tablet by mouth at bedtime if needed     No current facility-administered medications for this visit.    OBJECTIVE: Middle-aged white woman  There were no vitals filed for this visit.   There is no height or weight on file to calculate BMI.   Wt Readings from Last 3 Encounters:  11/26/19 164 lb 6.4 oz (74.6 kg)  08/14/19 161 lb (73 kg)  06/24/19 161 lb 3.2 oz (73.1 kg)      ECOG FS:1 - Symptomatic but completely ambulatory  Telemedicine visit 03/05/2020.   LAB RESULTS:  CMP     Component Value Date/Time   NA 142 11/26/2019 1024   NA 144 06/06/2017 1448   K 4.3 11/26/2019 1024   CL 107 11/26/2019 1024   CO2 28 11/26/2019 1024   GLUCOSE 91 11/26/2019 1024   BUN 12 11/26/2019 1024   BUN 12 06/06/2017 1448   CREATININE 0.80 11/26/2019 1024   CALCIUM 9.2 11/26/2019 1024   PROT 6.6 11/26/2019 1024   PROT 6.3 06/06/2017 1448   ALBUMIN 4.1 11/26/2019 1024   ALBUMIN 4.4 06/06/2017 1448   AST 45 (H) 11/26/2019 1024   ALT 31 11/26/2019 1024   ALKPHOS 74 11/26/2019 1024   BILITOT 0.7 11/26/2019 1024   GFRNONAA >60 11/26/2019 1024   GFRAA >60 11/26/2019 1024    No results  found for: TOTALPROTELP, ALBUMINELP, A1GS, A2GS, BETS, BETA2SER, GAMS, MSPIKE, SPEI  No results found for: KPAFRELGTCHN, LAMBDASER, KAPLAMBRATIO  Lab Results  Component Value Date   WBC 7.2 11/26/2019   NEUTROABS 2.5 11/26/2019   HGB 12.9 11/26/2019   HCT 40.1 11/26/2019   MCV 90.3 11/26/2019   PLT 190 11/26/2019   No results found for: LABCA2  No components found for: [id number]  No results for input(s): INR in the last 168 hours.  No results found for: LABCA2  No results found for: JZP915  No results found for: AVW979  No results found for: YIA165  No results found for: CA2729  No components found for: HGQUANT  No results found for: CEA1 / No results found for: CEA1   No results found for: AFPTUMOR  No results found for: CHROMOGRNA  No results found for: HGBA, HGBA2QUANT, HGBFQUANT, HGBSQUAN (Hemoglobinopathy evaluation)   No results found for: LDH  No results found for: IRON, TIBC, IRONPCTSAT (Iron and TIBC)  No results found for: FERRITIN  Urinalysis    Component Value Date/Time   COLORURINE YELLOW 08/22/2016 1947   APPEARANCEUR CLEAR 08/22/2016 1947   LABSPEC 1.026 08/22/2016 1947   PHURINE 5.5 08/22/2016 1947   GLUCOSEU NEGATIVE 08/22/2016 1947   HGBUR SMALL (A) 08/22/2016 1947   BILIRUBINUR NEGATIVE 08/22/2016 1947   KETONESUR NEGATIVE 08/22/2016 1947   PROTEINUR NEGATIVE 08/22/2016 1947   UROBILINOGEN 0.2 08/22/2016 1622   NITRITE NEGATIVE 08/22/2016 1947  LEUKOCYTESUR SMALL (A) 08/22/2016 1947    STUDIES: US BREAST LTD UNI LEFT INC AXILLA  Result Date: 03/03/2020 CLINICAL DATA:  History of LEFT lumpectomy 04/17/2017. Patient had subsequent radiation treatment.Patient had MR guided core biopsy of 2 sites of enhancement in the LEFT breast in June 2020. Pathology showed apocrine adenosis, dense fibrosis, and chronic inflammation in the UPPER-OUTER QUADRANT at the inferior site of the UPPER-OUTER QUADRANT. Pathology showed dense fibrosis with  chronic inflammation of the superior UPPER OUTER QUADRANT. Patient reports having a LEFT breast hematoma after the MR guided core biopsy, but did not seen any bruising. Six-month follow-up breast MRI was recommended but not performed. Patient reports increased pain and OUTER portion of the LEFT breast today. EXAM: DIGITAL DIAGNOSTIC BILATERAL MAMMOGRAM WITH CAD AND TOMO ULTRASOUND LEFT BREAST COMPARISON:  Multiple prior studies including 12/18/2019 ACR Breast Density Category d: The breast tissue is extremely dense, which lowers the sensitivity of mammography. FINDINGS: The density of the LEFT breast is increased compared to prior studies and asymmetric compared to the RIGHT breast. The findings may be accentuated by poor tolerance for adequate compression. No suspicious mass or distortion. Surgical clips and numerous tissue marker clip is identified in the LEFT breast. RIGHT breast is negative. Mammographic images were processed with CAD. On physical exam, patient is tender along the LATERAL portion of the LEFT breast. I palpate a discrete focal area of thickening without definitive mass in the 1 o'clock location of the LEFT breast 10 centimeters from the nipple. I palpate no abnormality in the LATERAL mid or LATERAL LOWER portions of the LEFT breast. A well-healed lumpectomy scar is identified in the 2 o'clock location. Targeted ultrasound is performed, showing an irregular hypoechoic area in the 1 o'clock location of the LEFT breast 3 centimeters from the nipple. Within this area there is a small hyperechoic focus, possibly representing tissue marker clip or calcification. This area measures 3.0 x 1.6 x 2.1 centimeters. Minimal internal versus adjacent vascularity. In the 1 o'clock location 10 centimeters from nipple, no sonographic abnormalities identified to correlate with the focal thickening. Postoperative changes are identified in the 2 o'clock location 9 centimeters from the nipple. IMPRESSION: 1.  Indeterminate hypoechoic area in the 1 o'clock location of the LEFT breast, possibly representing postoperative or post biopsy changes. Tissue diagnosis is recommended. 2. Focal thickening in the 1 o'clock location of the LEFT breast 10 centimeters from the nipple without sonographic or mammographic correlate. 3. Diffuse increased density of the LEFT breast, possibly technical, but indeterminate. RECOMMENDATION: 1. Recommend MR of both breasts, with and without contrast. 2. Following MRI, recommend ultrasound-guided core biopsy of LEFT breast 1 o'clock location, scheduled for the patient on 03/20/2020. I have discussed the findings and recommendations with the patient. If applicable, a reminder letter will be sent to the patient regarding the next appointment. BI-RADS CATEGORY  4: Suspicious. Electronically Signed   By: Nolon Nations M.D.   On: 03/03/2020 17:03   MM DIAG BREAST TOMO BILATERAL  Result Date: 03/03/2020 CLINICAL DATA:  History of LEFT lumpectomy 04/17/2017. Patient had subsequent radiation treatment.Patient had MR guided core biopsy of 2 sites of enhancement in the LEFT breast in June 2020. Pathology showed apocrine adenosis, dense fibrosis, and chronic inflammation in the UPPER-OUTER QUADRANT at the inferior site of the UPPER-OUTER QUADRANT. Pathology showed dense fibrosis with chronic inflammation of the superior UPPER OUTER QUADRANT. Patient reports having a LEFT breast hematoma after the MR guided core biopsy, but did not seen any bruising.  Six-month follow-up breast MRI was recommended but not performed. Patient reports increased pain and OUTER portion of the LEFT breast today. EXAM: DIGITAL DIAGNOSTIC BILATERAL MAMMOGRAM WITH CAD AND TOMO ULTRASOUND LEFT BREAST COMPARISON:  Multiple prior studies including 12/18/2019 ACR Breast Density Category d: The breast tissue is extremely dense, which lowers the sensitivity of mammography. FINDINGS: The density of the LEFT breast is increased  compared to prior studies and asymmetric compared to the RIGHT breast. The findings may be accentuated by poor tolerance for adequate compression. No suspicious mass or distortion. Surgical clips and numerous tissue marker clip is identified in the LEFT breast. RIGHT breast is negative. Mammographic images were processed with CAD. On physical exam, patient is tender along the LATERAL portion of the LEFT breast. I palpate a discrete focal area of thickening without definitive mass in the 1 o'clock location of the LEFT breast 10 centimeters from the nipple. I palpate no abnormality in the LATERAL mid or LATERAL LOWER portions of the LEFT breast. A well-healed lumpectomy scar is identified in the 2 o'clock location. Targeted ultrasound is performed, showing an irregular hypoechoic area in the 1 o'clock location of the LEFT breast 3 centimeters from the nipple. Within this area there is a small hyperechoic focus, possibly representing tissue marker clip or calcification. This area measures 3.0 x 1.6 x 2.1 centimeters. Minimal internal versus adjacent vascularity. In the 1 o'clock location 10 centimeters from nipple, no sonographic abnormalities identified to correlate with the focal thickening. Postoperative changes are identified in the 2 o'clock location 9 centimeters from the nipple. IMPRESSION: 1. Indeterminate hypoechoic area in the 1 o'clock location of the LEFT breast, possibly representing postoperative or post biopsy changes. Tissue diagnosis is recommended. 2. Focal thickening in the 1 o'clock location of the LEFT breast 10 centimeters from the nipple without sonographic or mammographic correlate. 3. Diffuse increased density of the LEFT breast, possibly technical, but indeterminate. RECOMMENDATION: 1. Recommend MR of both breasts, with and without contrast. 2. Following MRI, recommend ultrasound-guided core biopsy of LEFT breast 1 o'clock location, scheduled for the patient on 03/20/2020. I have discussed the  findings and recommendations with the patient. If applicable, a reminder letter will be sent to the patient regarding the next appointment. BI-RADS CATEGORY  4: Suspicious. Electronically Signed   By: Nolon Nations M.D.   On: 03/03/2020 17:03     ASSESSMENT: 74 y.o. [place] woman status post left breast upper outer quadrant biopsy 03/08/2018 for a clinical T1c-T2 N0, stage I invasive ductal carcinoma, grade 1 or 2, estrogen and progesterone receptor positive, HER-2 not amplified, with an MIB-1 of less than 1%  (1) additional biopsies as follows:  (a) right breast biopsy ([id number]) on 04/02/2018 showed atypical lobular hyperplasia  (b) left breast ([id number]) on 03/22/2018 showed invasive lobular carcinoma, grade 2, estrogen and progesterone receptor positive with an MIB-1 of 5%. HER-2 not amplified  (2) bilateral lumpectomies and left sentinel lymph node sampling 04/17/2018 found  (a) on the right side, atypical lobular hyperplasia  (b) on the left side, an mpT1c pN0, stage IA, grade 2 invasive lobular carcinoma, with positive margins  (c) additional left breast surgery on 05/15/2018 cleared the compromise margins  (3) The Oncotype DX score was 14, predicting a risk of outside the breast recurrence over the next 9 years of 4% if the patient's only systemic therapy is tamoxifen for 5 years.  It also predicts no significant benefit from chemotherapy.  (4) adjuvant radiation 07/09/2018 - 08/03/2018 Site/dose:  1. Left Breast / 40.05 Gy in 15 fractions 2. Left Breast Boost / 10 Gy in 5 fractions  (5) started tamoxifen 09/11/2018, discontinued June 2020 with cognitive issues   (6) genetics testing 03/22/2018 through [facility]'s STAT Breast panel with reflex to Multi-Cancer panel found no pathogenic mutations in (ALK, APC, ATM, AXIN2, BAP1, BARD1, BLM, BMPR1A, BRCA1, BRCA2, BRIP1, CASR, CDC73, CDH1, CDK4, CDKN1B, CDKN1C, CDKN2A, CEBPA, CHEK2, CTNNA1, DICER1, DIS3L2, EGFR, EPCAM, FH,  FLCN, GATA2, GPC3, GREM1, HOXB13, HRAS, KIT, MAX, MEN1, MET, MITF, MLH1, MSH2, MSH3, MSH6, MUTYH, NBN, NF1, NF2, NTHL1, PALB2, PDGFRA, PHOX2B, PMS2, POLD1, POLE, POT1, PRKAR1A, PTCH1, PTEN, RAD50, RAD51C, RAD51D, RB1, RECQL4, RET, RUNX1, SDHA, SDHAF2, SDHB, SDHC, SDHD, SMAD4, SMARCA4, SMARCB1, SMARCE1, STK11, SUFU, TERC, TERT, TMEM127, TP53, TSC1, TSC2, VHL, WRN, WT1).  (a) Variants of Uncertain Significance were found in in PDGFRA and RECQL4.  (7) started letrozole 09/26/2019, discontinued 11/26/2019 with cognitive issues  (a) bone density May 2014 normal, T score -0.9  (b) letrozole resumed 2020-01-22, taken Mondays, Wednesday, Friday, as being off medication made no difference to either hot flashes or cognitive deficiencies   PLAN: Betsy's situation is difficult.  She has very dense breasts and it is very difficult to assess them with the usual mammography.  There has been a change as compared to prior and MRI is the way to go.  She is understandably reluctant given her past experiences with MRIs but I really do not see an alternative other than crossing her fingers and hoping this is not cancer.  That really is not acceptable.  We discussed that scars usually are formed within 2 weeks of trauma or incision and after that they continue to remodel but not significantly.  Accordingly we cannot say with any certainty that the change just noted represents further change in her scar tissue.  In short unfortunately I do not see any other way of getting out what is going on then MRI and biopsy and [name] and [name] reluctantly agree so we are proceeding with both those procedures.  Her biopsy scheduled for April 9.  I am going to schedule a visit for the following week, so we can discuss any results.  If the results are all benign we will postpone that  Ireene Ballowe, Virgie Dad, MD  03/05/20 1:17 PM Medical Oncology and Hematology [facility] [place], [place] [address] Tel.  [phone]    Fax. [phone]   I, Wilburn Mylar, am acting as scribe for Dr. Virgie Dad. Jimena Wieczorek.  I, Lurline Del MD, have reviewed the above documentation for accuracy and completeness, and I agree with the above.   *Total Encounter Time as defined by the Centers for Medicare and Medicaid Services includes, in addition to the face-to-face time of a patient visit (documented in the note above) non-face-to-face time: obtaining and reviewing outside history, ordering and reviewing medications, tests or procedures, care coordination (communications with other health care professionals or caregivers) and documentation in the medical record.

## 2020-03-05 ENCOUNTER — Inpatient Hospital Stay: Payer: Medicare Other | Attending: Oncology | Admitting: Oncology

## 2020-03-05 DIAGNOSIS — Z170 Estrogen receptor positive status [ER+]: Secondary | ICD-10-CM | POA: Diagnosis not present

## 2020-03-05 DIAGNOSIS — Z79899 Other long term (current) drug therapy: Secondary | ICD-10-CM | POA: Insufficient documentation

## 2020-03-05 DIAGNOSIS — E039 Hypothyroidism, unspecified: Secondary | ICD-10-CM | POA: Insufficient documentation

## 2020-03-05 DIAGNOSIS — C50412 Malignant neoplasm of upper-outer quadrant of left female breast: Secondary | ICD-10-CM

## 2020-03-05 DIAGNOSIS — Z79811 Long term (current) use of aromatase inhibitors: Secondary | ICD-10-CM | POA: Insufficient documentation

## 2020-03-06 ENCOUNTER — Telehealth: Payer: Self-pay | Admitting: Oncology

## 2020-03-06 ENCOUNTER — Other Ambulatory Visit: Payer: Self-pay | Admitting: General Surgery

## 2020-03-06 ENCOUNTER — Other Ambulatory Visit: Payer: Self-pay | Admitting: *Deleted

## 2020-03-06 DIAGNOSIS — N6320 Unspecified lump in the left breast, unspecified quadrant: Secondary | ICD-10-CM

## 2020-03-06 DIAGNOSIS — Z9889 Other specified postprocedural states: Secondary | ICD-10-CM

## 2020-03-06 NOTE — Telephone Encounter (Signed)
Scheduled appt per 3/25 los. Left voicemail with appt details. Mailed reminder letter and calendar.  

## 2020-03-16 DIAGNOSIS — R531 Weakness: Secondary | ICD-10-CM | POA: Diagnosis not present

## 2020-03-16 DIAGNOSIS — R262 Difficulty in walking, not elsewhere classified: Secondary | ICD-10-CM | POA: Diagnosis not present

## 2020-03-17 DIAGNOSIS — R262 Difficulty in walking, not elsewhere classified: Secondary | ICD-10-CM | POA: Diagnosis not present

## 2020-03-17 DIAGNOSIS — R531 Weakness: Secondary | ICD-10-CM | POA: Diagnosis not present

## 2020-03-19 DIAGNOSIS — R262 Difficulty in walking, not elsewhere classified: Secondary | ICD-10-CM | POA: Diagnosis not present

## 2020-03-19 DIAGNOSIS — R531 Weakness: Secondary | ICD-10-CM | POA: Diagnosis not present

## 2020-03-20 ENCOUNTER — Other Ambulatory Visit: Payer: Medicare Other

## 2020-03-23 ENCOUNTER — Ambulatory Visit (HOSPITAL_COMMUNITY)
Admission: RE | Admit: 2020-03-23 | Discharge: 2020-03-23 | Disposition: A | Discharge status: Home / Self Care | Payer: Medicare Other | Source: Ambulatory Visit | Attending: Oncology | Admitting: Oncology

## 2020-03-23 ENCOUNTER — Other Ambulatory Visit: Payer: Self-pay

## 2020-03-23 DIAGNOSIS — R262 Difficulty in walking, not elsewhere classified: Secondary | ICD-10-CM | POA: Diagnosis not present

## 2020-03-23 DIAGNOSIS — C50412 Malignant neoplasm of upper-outer quadrant of left female breast: Secondary | ICD-10-CM | POA: Diagnosis not present

## 2020-03-23 DIAGNOSIS — Z170 Estrogen receptor positive status [ER+]: Secondary | ICD-10-CM | POA: Insufficient documentation

## 2020-03-23 DIAGNOSIS — N6489 Other specified disorders of breast: Secondary | ICD-10-CM | POA: Diagnosis not present

## 2020-03-23 DIAGNOSIS — R531 Weakness: Secondary | ICD-10-CM | POA: Diagnosis not present

## 2020-03-23 MED ORDER — GADOBUTROL 1 MMOL/ML IV SOLN
7.0000 mL | Freq: Once | INTRAVENOUS | Status: AC | PRN
  Administered 2020-03-23: 10:00:00 7 mL via INTRAVENOUS

## 2020-03-24 ENCOUNTER — Other Ambulatory Visit: Payer: Self-pay | Admitting: Oncology

## 2020-03-24 ENCOUNTER — Ambulatory Visit
Admission: RE | Admit: 2020-03-24 | Discharge: 2020-03-24 | Disposition: A | Discharge status: Home / Self Care | Payer: Medicare Other | Source: Ambulatory Visit | Attending: General Surgery | Admitting: General Surgery

## 2020-03-24 DIAGNOSIS — N6321 Unspecified lump in the left breast, upper outer quadrant: Secondary | ICD-10-CM | POA: Diagnosis not present

## 2020-03-24 DIAGNOSIS — N6320 Unspecified lump in the left breast, unspecified quadrant: Secondary | ICD-10-CM

## 2020-03-24 DIAGNOSIS — Z9889 Other specified postprocedural states: Secondary | ICD-10-CM

## 2020-03-24 DIAGNOSIS — Z170 Estrogen receptor positive status [ER+]: Secondary | ICD-10-CM

## 2020-03-24 DIAGNOSIS — C50412 Malignant neoplasm of upper-outer quadrant of left female breast: Secondary | ICD-10-CM

## 2020-03-24 DIAGNOSIS — N641 Fat necrosis of breast: Secondary | ICD-10-CM | POA: Diagnosis not present

## 2020-03-26 DIAGNOSIS — R262 Difficulty in walking, not elsewhere classified: Secondary | ICD-10-CM | POA: Diagnosis not present

## 2020-03-26 DIAGNOSIS — R531 Weakness: Secondary | ICD-10-CM | POA: Diagnosis not present

## 2020-03-26 NOTE — Progress Notes (Signed)
[place]  Telephone:([phone]) [phone] Fax:(336) [phone]     ID: CAYLYN TEDESCHI DOB: 08-01-1946  MR#: [id number]  VZD#:[id number]  Patient Care Team: Shon Baton, MD as PCP - General Rolm Bookbinder, MD as Consulting Physician (General Surgery) Haddie Bruhl, Virgie Dad, MD as Consulting Physician (Oncology) Eppie Gibson, MD as Attending Physician (Radiation Oncology) Lelon Perla, MD as Consulting Physician (Cardiology) [name], MD as Consulting Physician (Obstetrics and Gynecology) Jacquelynn Cree, PT as Physical Therapist (Physical Therapy) OTHER MD:  CHIEF COMPLAINT: Estrogen receptor positive breast cancer  CURRENT TREATMENT: letrozole every Mon/Wed/Fri   INTERVAL HISTORY: [name] returns today for follow-up of her estrogen receptor positive breast cancer accompanied by her husband [name].  She continues on letrozole which she takes Monday Wednesday Friday.  She does not feel any different on the drug than she did when she went off the drug for multiple weeks.  She does not feel any better on Friday, which is the "extra off day".  She describes the drug is "better than tamoxifen", and says that currently hot flashes are "okay".  Nevertheless there are multiple issues that remain unresolved and are significantly affecting her quality of life.  She thinks a lot of her inability to concentrate and remember things has to do with the anesthesia she received for her various procedures in the last few years.  She is very dissatisfied with her left knee arthroscopy results and continues to have problem with sciatica..   She has very dense breasts and continues to have significant left breast pain.  She underwent bilateral breast MRI with and without contrast on 03/23/2020 and this showed clumped non-mass enhancement in the upper outer right breast as well as postlumpectomy changes in the left breast.  Ultrasound-guided biopsy of the left breast was performed  03/24/2020 and showed only fat necrosis.  She is reluctant to undergo further biopsies as scheduled on the right.  REVIEW OF SYSTEMS: [name] had a second Ortho opinion through [place] in January and Dr. Jaynee Eagles recommended stretching exercises, heat, and referral to physical therapy.  At home she does some reading, watches TV, and she feels very guilty because increasing duties of fall on [place].  His statement is that "her quality of life is 0".  She was tearful during the visit today.    HISTORY OF CURRENT ILLNESS: From the original intake note:  "[name]" had routine diagnostic mammography on 03/08/2018 showing breast density category D.  There was a 1.5 cm irregular mass in the left upper outer quadrant posterior depth.  This was further imaged with ultrasonography on the same day.  The left axilla was sonographically benign.  No abnormalities were seen in the left axilla.  Accordingly on 03/08/2018 she proceeded to biopsy of the left breast area in question. The pathology from this procedure showed ([id number]): Invasive ductal carcinoma, grade I-II. HER-2 negative with the ratio being 1.61 and number per cell 3.30.  The tumor was estrogen receptor 100% positive and progesterone receptor 100% positive, both with strong staining intensity.  Ki67 was <1%.   On 03/18/2018, she underwent a bilateral breast MRI with and without contrast, showing the mass in question in a setting of 3.3 cm non-masslike enhancement in the posterior aspect of the left upper-outer quadrant. in addition there was a 1.1 cm mass in the left upper-outer quadrant located 2.1 cm anterior and lateral to the biopsy proven malignant.   Right breast showed two indeterminate 5 mm adjacent enhancing nodules in the upper-inner  quadrant.   The patient's subsequent history is as detailed below.   PAST MEDICAL HISTORY: Past Medical History:  Diagnosis Date  . Arthritis    hands and knees  . Complication of anesthesia     aspiration pna; following a colonoscopy, pt requests head of bed elevated if possible.  . Constipation   . Cough   . Depression   . Dysrhythmia    RBBB on 06-06-17 ekg   . Elevated liver function tests   . Esophageal stricture   . Family history of breast cancer   . Gallstones   . Genetic testing 04/02/2018   STAT Breast panel with reflex to Multi-Cancer panel (83 genes) @ [facility] - No pathogenic mutations detected  . GERD (gastroesophageal reflux disease)   . Hiatal hernia   . History of kidney stones   . History of radiation therapy 07/09/2018- 08/03/18   Left Breast/ 40.05 Gy in 15 fractions. Left Breast boost 10 Gy in 5 fractions.   . Hyperlipemia   . Hypothyroidism   . Insomnia   . Malignant neoplasm of upper-outer quadrant of left female breast ([place])   . Nephrolithiasis   . Pneumonia 2015   aspirated after colonosopy  . Renal cyst     PAST SURGICAL HISTORY: Past Surgical History:  Procedure Laterality Date  . BREAST BIOPSY Left 05/31/2019   mri  . BREAST BIOPSY Left 04/2019   malignant  . BREAST LUMPECTOMY Left 04/17/2018   malignant  . BREAST LUMPECTOMY WITH RADIOACTIVE SEED AND SENTINEL LYMPH NODE BIOPSY Left 04/17/2018   Procedure: LEFT BREAST LUMPECTOMY WITH BRACKETED RADIOACTIVE SEEDS AND SENTINEL LYMPH NODE BIOPSY;  Surgeon: Rolm Bookbinder, MD;  Location: [facility];  Service: General;  Laterality: Left;  . BREAST SURGERY Left    Lumpectomy  . CARDIOVASCULAR STRESS TEST  07/19/2006   EF 70%, NO EVIDENCE OF ISCHEMIA  . CHOLECYSTECTOMY  12/30/10  . COLONOSCOPY    . DILATION AND CURETTAGE OF UTERUS    . HYSTEROSCOPY WITH D & C N/A 06/07/2018   Procedure: DILATATION AND CURETTAGE /HYSTEROSCOPY;  Surgeon: [name], MD;  Location: [facility];  Service: Gynecology;  Laterality: N/A;  . inguinal herniography    . NASAL SINUS SURGERY    . RADIOACTIVE SEED GUIDED EXCISIONAL BREAST BIOPSY Right 04/17/2018   Procedure: RIGHT BREAST SEED GUIDED EXCISIONAL  BIOPSY;  Surgeon: Rolm Bookbinder, MD;  Location: [facility];  Service: General;  Laterality: Right;  . RE-EXCISION OF BREAST LUMPECTOMY Left 05/15/2018   Procedure: RE-EXCISION OF LEFT BREAST LUMPECTOMY, ASPIRATION OF LEFT AXILLARY SEROMA;  Surgeon: Rolm Bookbinder, MD;  Location: [facility];  Service: General;  Laterality: Left;  . T & A    . TOOTH EXTRACTION    . TOTAL KNEE ARTHROPLASTY Left 09/11/2017   Procedure: LEFT TOTAL KNEE ARTHROPLASTY;  Surgeon: Gaynelle Arabian, MD;  Location: [facility];  Service: Orthopedics;  Laterality: Left;  with block  . US ECHOCARDIOGRAPHY  07/17/2006   EF 55-60%    FAMILY HISTORY Family History  Problem Relation Age of Onset  . Breast cancer Mother 43       deceased 55; TAH/BSO in 90s/50s  . Dementia Father        deceased 50  . Breast cancer Maternal Aunt 82       deceased 29s  . Colon cancer Neg Hx   . Stomach cancer Neg Hx    Her father died of old age at 70 years old. Her  mother had breast cancer at 44 and died at 74 years old. She will undergo genetic testing. Her maternal aunt had breast cancer at 42 years old. No family history of ovarian cancer. She had one brother and one sister.    GYNECOLOGIC HISTORY:  No LMP recorded. Patient is postmenopausal. Menarche: 74 years old Gumlog P 0 LMP: 1997 Contraceptive: 2 years HRT: Estrogen and progesterone for about 7 years  Hysterectomy? No   SOCIAL HISTORY:  She is a retired Futures trader. She has been married to [place], for 45 plus years. He worked in the [employer]. Together they adopted two children. One daughter, [name], 35 of [place], who is a Psychologist, sport and exercise for an OBGYN, and one son, Braileigh Landenberger, 36 of [place], who owns a Marketing executive business. The patient has three grandchildren. She attends [employer].    ADVANCED DIRECTIVES: In place   HEALTH MAINTENANCE: Social History   Tobacco Use  . Smoking status: Never  Smoker  . Smokeless tobacco: Never Used  Substance Use Topics  . Alcohol use: No  . Drug use: No    Colonoscopy: 2014/ [facility]  PAP: 2018  Bone density:  May 2014 at [facility] showed a T score of -0.9 normal   Allergies  Allergen Reactions  . Oxycontin [Oxycodone Hcl]     "it made me feel very high and hyper"   . Codeine Nausea And Vomiting    Current Outpatient Medications  Medication Sig Dispense Refill  . acetaminophen (TYLENOL) 500 MG tablet Take 500 mg by mouth every 6 (six) hours as needed for mild pain or headache.    Marland Kitchen azelastine (ASTELIN) 0.1 % nasal spray Place 1 spray into both nostrils 2 (two) times daily. Use in each nostril as directed    . BIOTIN PO Take 1 tablet by mouth daily.    Marland Kitchen buPROPion (WELLBUTRIN XL) 150 MG 24 hr tablet Take 1 tablet (150 mg total) by mouth daily. 30 tablet 0  . Cholecalciferol (VITAMIN D3) 1000 units CAPS Take 1 capsule by mouth 2 (two) times daily.     Marland Kitchen escitalopram (LEXAPRO) 10 MG tablet Take 15 mg by mouth daily.     Marland Kitchen esomeprazole (NEXIUM) 40 MG capsule Take 40 mg by mouth 2 (two) times daily before a meal.    . ezetimibe (ZETIA) 10 MG tablet Take 10 mg by mouth at bedtime.    . fluticasone (FLONASE) 50 MCG/ACT nasal spray Place 1 spray into both nostrils daily.    Marland Kitchen levothyroxine (SYNTHROID, LEVOTHROID) 50 MCG tablet Take 50 mcg by mouth at bedtime.    . Magnesium 250 MG TABS Take 1 tablet by mouth daily.     . naproxen sodium (ALEVE) 220 MG tablet Take 220 mg by mouth 2 (two) times daily as needed (pain).    Marland Kitchen OVER THE COUNTER MEDICATION Apply 1 application topically daily as needed (pain). Natural pain relief ointment    . Probiotic Product (PROBIOTIC ADVANCED PO) Take 1 tablet by mouth daily.    . rosuvastatin (CRESTOR) 5 MG tablet Take 5 mg by mouth at bedtime.     . sodium chloride (OCEAN) 0.65 % SOLN nasal spray Place 4 sprays into both nostrils 4 (four) times daily.    . SUPER B COMPLEX/C PO Take 1 tablet by mouth daily.    Marland Kitchen  zolpidem (AMBIEN) 10 MG tablet zolpidem 10 mg tablet  take 1 tablet by mouth at bedtime if needed     No  current facility-administered medications for this visit.    OBJECTIVE: Middle-aged white woman who appears younger than stated age 74:   03/27/20 0938  BP: 126/73  Pulse: 87  Resp: 17  Temp: 97.8 F (36.6 C)  SpO2: 100%     Body mass index is 24.4 kg/m.   Wt Readings from Last 3 Encounters:  03/27/20 160 lb 8 oz (72.8 kg)  11/26/19 164 lb 6.4 oz (74.6 kg)  08/14/19 161 lb (73 kg)      ECOG FS:2 - Symptomatic, <50% confined to bed  Sclerae unicteric, EOMs intact Wearing a mask No cervical or supraclavicular adenopathy Lungs no rales or rhonchi Heart regular rate and rhythm Abd soft, nontender, positive bowel sounds MSK no focal spinal tenderness, no upper extremity lymphedema Neuro: nonfocal, well oriented, depressed affect Breasts: Status post bilateral lumpectomies and left sided radiation with no evidence of disease recurrence.  Both axillae are benign.    LAB RESULTS:  CMP     Component Value Date/Time   NA 142 03/27/2020 0900   NA 144 06/06/2017 1448   K 3.7 03/27/2020 0900   CL 108 03/27/2020 0900   CO2 26 03/27/2020 0900   GLUCOSE 100 (H) 03/27/2020 0900   BUN 12 03/27/2020 0900   BUN 12 06/06/2017 1448   CREATININE 0.84 03/27/2020 0900   CREATININE 0.80 11/26/2019 1024   CALCIUM 9.4 03/27/2020 0900   PROT 7.2 03/27/2020 0900   PROT 6.3 06/06/2017 1448   ALBUMIN 4.3 03/27/2020 0900   ALBUMIN 4.4 06/06/2017 1448   AST 34 03/27/2020 0900   AST 45 (H) 11/26/2019 1024   ALT 29 03/27/2020 0900   ALT 31 11/26/2019 1024   ALKPHOS 89 03/27/2020 0900   BILITOT 0.8 03/27/2020 0900   BILITOT 0.7 11/26/2019 1024   GFRNONAA >60 03/27/2020 0900   GFRNONAA >60 11/26/2019 1024   GFRAA >60 03/27/2020 0900   GFRAA >60 11/26/2019 1024    No results found for: TOTALPROTELP, ALBUMINELP, A1GS, A2GS, BETS, BETA2SER, GAMS, MSPIKE, SPEI  No results found  for: KPAFRELGTCHN, LAMBDASER, KAPLAMBRATIO  Lab Results  Component Value Date   WBC 6.7 03/27/2020   NEUTROABS 2.1 03/27/2020   HGB 14.1 03/27/2020   HCT 42.9 03/27/2020   MCV 90.9 03/27/2020   PLT 202 03/27/2020   No results found for: LABCA2  No components found for: TMHDQQ229  No results for input(s): INR in the last 168 hours.  No results found for: LABCA2  No results found for: NLG921  No results found for: JHE174  No results found for: YCX448  No results found for: CA2729  No components found for: HGQUANT  No results found for: CEA1 / No results found for: CEA1   No results found for: AFPTUMOR  No results found for: CHROMOGRNA  No results found for: HGBA, HGBA2QUANT, HGBFQUANT, HGBSQUAN (Hemoglobinopathy evaluation)   No results found for: LDH  No results found for: IRON, TIBC, IRONPCTSAT (Iron and TIBC)  No results found for: FERRITIN  Urinalysis    Component Value Date/Time   COLORURINE YELLOW 08/22/2016 1947   APPEARANCEUR CLEAR 08/22/2016 1947   LABSPEC 1.026 08/22/2016 1947   PHURINE 5.5 08/22/2016 1947   GLUCOSEU NEGATIVE 08/22/2016 1947   HGBUR SMALL (A) 08/22/2016 1947   BILIRUBINUR NEGATIVE 08/22/2016 [place] 08/22/2016 1947   PROTEINUR NEGATIVE 08/22/2016 1947   UROBILINOGEN 0.2 08/22/2016 1622   NITRITE NEGATIVE 08/22/2016 1947   LEUKOCYTESUR SMALL (A) 08/22/2016 1947    STUDIES: MR  BREAST BILATERAL W WO CONTRAST INC CAD  Result Date: 03/23/2020 CLINICAL DATA:  74 year old female with history of left breast cancer status post lumpectomy in 2018 with subsequent radiation treatment. The patient had 2 core biopsies of the left breast in June 2020 demonstrating apocrine adenosis, dense fibrosis and chronic inflammation. She presents for a follow-up breast MRI today. Additional right lumpectomy for biopsy-proven ALH. Diagnostic evaluation in March 2021 for increasing left breast pain demonstrated an indeterminate  hypoechoic area for which tissue sampling is recommended. This has not yet been performed. LABS:  None performed on site. EXAM: BILATERAL BREAST MRI WITH AND WITHOUT CONTRAST TECHNIQUE: Multiplanar, multisequence MR images of both breasts were obtained prior to and following the intravenous administration of 7 ml of Gadavist. Three-dimensional MR images were rendered by post-processing of the original MR data on an independent workstation. The three-dimensional MR images were interpreted, and findings are reported in the following complete MRI report for this study. Three dimensional images were evaluated at the independent DynaCad workstation COMPARISON:  Previous exam(s). FINDINGS: Breast composition: d. Extreme fibroglandular tissue. Background parenchymal enhancement: Mild. Right breast: Areas of focal, clumped non-mass enhancement are identified in the upper right breast at mid to posterior depth (series 11, images 44 and 48/120). These are new from prior comparison examinations. No other suspicious mass or abnormal enhancement. Left breast: Post-lumpectomy changes are noted in the far posterolateral left breast. Additionally, susceptibility artifact from two post-biopsy clips are noted in the upper outer quadrant anteriorly. No dominant, suspicious mass or abnormal enhancement identified within the left breast. Lymph nodes: No abnormal appearing lymph nodes. Ancillary findings:  None. IMPRESSION: 1. Indeterminate, clumped non-mass enhancement in the upper outer right breast at mid to posterior depth (series 11, images 44 and 48). Recommendation is for two-area MRI guided biopsy. 2. Post-lumpectomy and post-biopsy changes in the left breast without suspicious MRI findings. Recommendation stands for ultrasound-guided biopsy as recommended on 03/03/2020. 3. No suspicious lymphadenopathy. RECOMMENDATION: 1. Two area MRI guided biopsy of the right breast. 2. Ultrasound-guided biopsy of the left breast as  recommended on 03/03/2020. BI-RADS CATEGORY  4: Suspicious. Electronically Signed   By: Kristopher Oppenheim M.D.   On: 03/23/2020 13:49   US BREAST LTD UNI LEFT INC AXILLA  Result Date: 03/03/2020 CLINICAL DATA:  History of LEFT lumpectomy 04/17/2017. Patient had subsequent radiation treatment.Patient had MR guided core biopsy of 2 sites of enhancement in the LEFT breast in June 2020. Pathology showed apocrine adenosis, dense fibrosis, and chronic inflammation in the UPPER-OUTER QUADRANT at the inferior site of the UPPER-OUTER QUADRANT. Pathology showed dense fibrosis with chronic inflammation of the superior UPPER OUTER QUADRANT. Patient reports having a LEFT breast hematoma after the MR guided core biopsy, but did not seen any bruising. Six-month follow-up breast MRI was recommended but not performed. Patient reports increased pain and OUTER portion of the LEFT breast today. EXAM: DIGITAL DIAGNOSTIC BILATERAL MAMMOGRAM WITH CAD AND TOMO ULTRASOUND LEFT BREAST COMPARISON:  Multiple prior studies including 12/18/2019 ACR Breast Density Category d: The breast tissue is extremely dense, which lowers the sensitivity of mammography. FINDINGS: The density of the LEFT breast is increased compared to prior studies and asymmetric compared to the RIGHT breast. The findings may be accentuated by poor tolerance for adequate compression. No suspicious mass or distortion. Surgical clips and numerous tissue marker clip is identified in the LEFT breast. RIGHT breast is negative. Mammographic images were processed with CAD. On physical exam, patient is tender along the LATERAL portion  of the LEFT breast. I palpate a discrete focal area of thickening without definitive mass in the 1 o'clock location of the LEFT breast 10 centimeters from the nipple. I palpate no abnormality in the LATERAL mid or LATERAL LOWER portions of the LEFT breast. A well-healed lumpectomy scar is identified in the 2 o'clock location. Targeted ultrasound is  performed, showing an irregular hypoechoic area in the 1 o'clock location of the LEFT breast 3 centimeters from the nipple. Within this area there is a small hyperechoic focus, possibly representing tissue marker clip or calcification. This area measures 3.0 x 1.6 x 2.1 centimeters. Minimal internal versus adjacent vascularity. In the 1 o'clock location 10 centimeters from nipple, no sonographic abnormalities identified to correlate with the focal thickening. Postoperative changes are identified in the 2 o'clock location 9 centimeters from the nipple. IMPRESSION: 1. Indeterminate hypoechoic area in the 1 o'clock location of the LEFT breast, possibly representing postoperative or post biopsy changes. Tissue diagnosis is recommended. 2. Focal thickening in the 1 o'clock location of the LEFT breast 10 centimeters from the nipple without sonographic or mammographic correlate. 3. Diffuse increased density of the LEFT breast, possibly technical, but indeterminate. RECOMMENDATION: 1. Recommend MR of both breasts, with and without contrast. 2. Following MRI, recommend ultrasound-guided core biopsy of LEFT breast 1 o'clock location, scheduled for the patient on 03/20/2020. I have discussed the findings and recommendations with the patient. If applicable, a reminder letter will be sent to the patient regarding the next appointment. BI-RADS CATEGORY  4: Suspicious. Electronically Signed   By: Nolon Nations M.D.   On: 03/03/2020 17:03   MM DIAG BREAST TOMO BILATERAL  Result Date: 03/03/2020 CLINICAL DATA:  History of LEFT lumpectomy 04/17/2017. Patient had subsequent radiation treatment.Patient had MR guided core biopsy of 2 sites of enhancement in the LEFT breast in June 2020. Pathology showed apocrine adenosis, dense fibrosis, and chronic inflammation in the UPPER-OUTER QUADRANT at the inferior site of the UPPER-OUTER QUADRANT. Pathology showed dense fibrosis with chronic inflammation of the superior UPPER OUTER  QUADRANT. Patient reports having a LEFT breast hematoma after the MR guided core biopsy, but did not seen any bruising. Six-month follow-up breast MRI was recommended but not performed. Patient reports increased pain and OUTER portion of the LEFT breast today. EXAM: DIGITAL DIAGNOSTIC BILATERAL MAMMOGRAM WITH CAD AND TOMO ULTRASOUND LEFT BREAST COMPARISON:  Multiple prior studies including 12/18/2019 ACR Breast Density Category d: The breast tissue is extremely dense, which lowers the sensitivity of mammography. FINDINGS: The density of the LEFT breast is increased compared to prior studies and asymmetric compared to the RIGHT breast. The findings may be accentuated by poor tolerance for adequate compression. No suspicious mass or distortion. Surgical clips and numerous tissue marker clip is identified in the LEFT breast. RIGHT breast is negative. Mammographic images were processed with CAD. On physical exam, patient is tender along the LATERAL portion of the LEFT breast. I palpate a discrete focal area of thickening without definitive mass in the 1 o'clock location of the LEFT breast 10 centimeters from the nipple. I palpate no abnormality in the LATERAL mid or LATERAL LOWER portions of the LEFT breast. A well-healed lumpectomy scar is identified in the 2 o'clock location. Targeted ultrasound is performed, showing an irregular hypoechoic area in the 1 o'clock location of the LEFT breast 3 centimeters from the nipple. Within this area there is a small hyperechoic focus, possibly representing tissue marker clip or calcification. This area measures 3.0 x 1.6 x 2.1  centimeters. Minimal internal versus adjacent vascularity. In the 1 o'clock location 10 centimeters from nipple, no sonographic abnormalities identified to correlate with the focal thickening. Postoperative changes are identified in the 2 o'clock location 9 centimeters from the nipple. IMPRESSION: 1. Indeterminate hypoechoic area in the 1 o'clock location of  the LEFT breast, possibly representing postoperative or post biopsy changes. Tissue diagnosis is recommended. 2. Focal thickening in the 1 o'clock location of the LEFT breast 10 centimeters from the nipple without sonographic or mammographic correlate. 3. Diffuse increased density of the LEFT breast, possibly technical, but indeterminate. RECOMMENDATION: 1. Recommend MR of both breasts, with and without contrast. 2. Following MRI, recommend ultrasound-guided core biopsy of LEFT breast 1 o'clock location, scheduled for the patient on 03/20/2020. I have discussed the findings and recommendations with the patient. If applicable, a reminder letter will be sent to the patient regarding the next appointment. BI-RADS CATEGORY  4: Suspicious. Electronically Signed   By: Nolon Nations M.D.   On: 03/03/2020 17:03   MM CLIP PLACEMENT LEFT  Result Date: 03/24/2020 CLINICAL DATA:  Patient status post ultrasound-guided biopsy left breast mass 1 o'clock position. EXAM: DIAGNOSTIC LEFT MAMMOGRAM POST ULTRASOUND BIOPSY COMPARISON:  Previous exam(s). FINDINGS: Mammographic images were obtained following ultrasound guided biopsy of left breast mass 1 o'clock position. The biopsy marking clip is in expected position at the site of biopsy. Additional previously placed biopsy marking clips remain in position. IMPRESSION: Appropriate positioning of the ribbon shaped biopsy marking clip at the site of biopsy in the left breast mass 1 o'clock position. Final Assessment: Post Procedure Mammograms for Marker Placement Electronically Signed   By: Lovey Newcomer M.D.   On: 03/24/2020 14:05   [place] LT BREAST BX W LOC DEV 1ST LESION IMG BX SPEC US GUIDE  Addendum Date: 03/25/2020   ADDENDUM REPORT: 03/25/2020 11:16 ADDENDUM: Pathology revealed ORGANIZING FAT NECROSIS WITH CALCIFICATIONS of the LEFT breast, 1 o'clock. This was found to be concordant by Dr. Lovey Newcomer. Pathology results were discussed with the patient by telephone. The patient  reported doing well after the biopsy with tenderness at the site. Post biopsy instructions and care were reviewed and questions were answered. The patient was encouraged to call [facility] for any additional concerns. Recommendation is for two-area MRI guided biopsy of indeterminate, clumped non-mass enhancement in the upper outer RIGHT breast at mid to posterior depth (series 11, images 44 and 48). Pathology results reported by Stacie Acres RN on 03/25/2020. Electronically Signed   By: Lovey Newcomer M.D.   On: 03/25/2020 11:16   Result Date: 03/25/2020 CLINICAL DATA:  Patient with indeterminate left breast mass 1 o'clock position. EXAM: ULTRASOUND GUIDED LEFT BREAST CORE NEEDLE BIOPSY COMPARISON:  Previous exam(s). PROCEDURE: I met with the patient and we discussed the procedure of ultrasound-guided biopsy, including benefits and alternatives. We discussed the high likelihood of a successful procedure. We discussed the risks of the procedure, including infection, bleeding, tissue injury, clip migration, and inadequate sampling. Informed written consent was given. The usual time-out protocol was performed immediately prior to the procedure. Lesion quadrant: Upper outer quadrant Using sterile technique and 1% Lidocaine as local anesthetic, under direct ultrasound visualization, a 14 gauge spring-loaded device was used to perform biopsy of left breast mass 1 o'clock position using a lateral approach. At the conclusion of the procedure ribbon shaped tissue marker clip was deployed into the biopsy cavity. Follow up 2 view mammogram was performed and dictated separately. IMPRESSION: Ultrasound  guided biopsy of left breast mass 1 o'clock position. No apparent complications. Electronically Signed: By: Lovey Newcomer M.D. On: 03/24/2020 14:03     ASSESSMENT: 74 y.o. [place] woman status post left breast upper outer quadrant biopsy 03/08/2018 for a clinical T1c-T2 N0, stage I invasive ductal  carcinoma, grade 1 or 2, estrogen and progesterone receptor positive, HER-2 not amplified, with an MIB-1 of less than 1%  (1) additional biopsies as follows:  (a) right breast biopsy ([id number]) on 04/02/2018 showed atypical lobular hyperplasia  (b) left breast ([id number]) on 03/22/2018 showed invasive lobular carcinoma, grade 2, estrogen and progesterone receptor positive with an MIB-1 of 5%. HER-2 not amplified  (2) bilateral lumpectomies and left sentinel lymph node sampling 04/17/2018 found  (a) on the right side, atypical lobular hyperplasia  (b) on the left side, an mpT1c pN0, stage IA, grade 2 invasive lobular carcinoma, with positive margins  (c) additional left breast surgery on 05/15/2018 cleared the compromise margins  (3) The Oncotype DX score was 14, predicting a risk of outside the breast recurrence over the next 9 years of 4% if the patient's only systemic therapy is tamoxifen for 5 years.  It also predicts no significant benefit from chemotherapy.  (4) adjuvant radiation 07/09/2018 - 08/03/2018 Site/dose:    1. Left Breast / 40.05 Gy in 15 fractions 2. Left Breast Boost / 10 Gy in 5 fractions  (5) started tamoxifen 09/11/2018, discontinued June 2020 with cognitive issues   (6) genetics testing 03/22/2018 through [facility]'s STAT Breast panel with reflex to Multi-Cancer panel found no pathogenic mutations in (ALK, APC, ATM, AXIN2, BAP1, BARD1, BLM, BMPR1A, BRCA1, BRCA2, BRIP1, CASR, CDC73, CDH1, CDK4, CDKN1B, CDKN1C, CDKN2A, CEBPA, CHEK2, CTNNA1, DICER1, DIS3L2, EGFR, EPCAM, FH, FLCN, GATA2, GPC3, GREM1, HOXB13, HRAS, KIT, MAX, MEN1, MET, MITF, MLH1, MSH2, MSH3, MSH6, MUTYH, NBN, NF1, NF2, NTHL1, PALB2, PDGFRA, PHOX2B, PMS2, POLD1, POLE, POT1, PRKAR1A, PTCH1, PTEN, RAD50, RAD51C, RAD51D, RB1, RECQL4, RET, RUNX1, SDHA, SDHAF2, SDHB, SDHC, SDHD, SMAD4, SMARCA4, SMARCB1, SMARCE1, STK11, SUFU, TERC, TERT, TMEM127, TP53, TSC1, TSC2, VHL, WRN, WT1).  (a) Variants of Uncertain  Significance were found in in PDGFRA and RECQL4.  (7) started letrozole 09/26/2019, discontinued 11/26/2019 with cognitive issues  (a) bone density May 2014 normal, T score -0.9  (b) letrozole resumed 2020-01-22, taken Mondays, Wednesday, Friday, as being off medication made no difference to either hot flashes or cognitive deficiencies   PLAN: [name]'s situation is complex.  From a breast cancer point of view, there is no evidence of recurrent disease or metastatic disease.  The 2 issues are that she has very dense breasts which is the reason we obtained MRIs, and of course MRIs are a common source of false positives.  We have discussed this before and while we can stop doing MRIs, my recommendation is that we go ahead and obtain biopsies of the 2 areas in question in the right breast.  Hopefully these will be benign as the biopsy of the left breast was.  Frequently in my experience the rate of false positives declines as intensified screening continues since we have already assessed the initially found potential abnormalities.  This would be the safest route for [facility] although she hates the idea of further procedures and further sedation.  Quite aside from that issue she has a poor functional status, is confused, and appears depressed.  I offered a referral to a neurologist for further evaluation but she declined.  I also suggested that she could do neuropsychological testing to document  the exact extent of cognitive dysfunction or loss but she does not want any more tests at this point.  Since she appears depressed I suggested a psychiatrist also might be helpful.  She does not want any more doctors at present.  She did agree to the addition of Wellbutrin and I am hopeful this will provide her a little bit of relief.  If so the next step would be for her to start a regular exercise program, which can help cognitive dysfunction by increasing cerebral blood flow and in general also helps with issues like  pain for example the pain she is having postoperatively in the left breast and left shoulder area.  I have put in the orders for the MRI biopsy and I plan to see her again in 3 months, with last the same day.  They know to call for any other issue that may develop before then.  Total encounter time 30 minutes.*   [name], Virgie Dad, MD  03/29/20 2:33 PM Medical Oncology and Hematology [facility] [place], [place] [address] Tel. [phone]    Fax. [phone]   I, Wilburn Mylar, am acting as scribe for Dr. Virgie Dad. Eryka Dolinger.  I, Lurline Del MD, have reviewed the above documentation for accuracy and completeness, and I agree with the above.   *Total Encounter Time as defined by the Centers for Medicare and Medicaid Services includes, in addition to the face-to-face time of a patient visit (documented in the note above) non-face-to-face time: obtaining and reviewing outside history, ordering and reviewing medications, tests or procedures, care coordination (communications with other health care professionals or caregivers) and documentation in the medical record.

## 2020-03-27 ENCOUNTER — Inpatient Hospital Stay: Payer: Medicare Other

## 2020-03-27 ENCOUNTER — Other Ambulatory Visit: Payer: Self-pay

## 2020-03-27 ENCOUNTER — Inpatient Hospital Stay: Payer: Medicare Other | Attending: Oncology | Admitting: Oncology

## 2020-03-27 VITALS — BP 126/73 | HR 87 | Temp 97.8°F | Resp 17 | Ht 68.0 in | Wt 160.5 lb

## 2020-03-27 DIAGNOSIS — E039 Hypothyroidism, unspecified: Secondary | ICD-10-CM | POA: Diagnosis not present

## 2020-03-27 DIAGNOSIS — Z79899 Other long term (current) drug therapy: Secondary | ICD-10-CM | POA: Diagnosis not present

## 2020-03-27 DIAGNOSIS — C50412 Malignant neoplasm of upper-outer quadrant of left female breast: Secondary | ICD-10-CM | POA: Insufficient documentation

## 2020-03-27 DIAGNOSIS — Z923 Personal history of irradiation: Secondary | ICD-10-CM | POA: Diagnosis not present

## 2020-03-27 DIAGNOSIS — F329 Major depressive disorder, single episode, unspecified: Secondary | ICD-10-CM | POA: Diagnosis not present

## 2020-03-27 DIAGNOSIS — N6310 Unspecified lump in the right breast, unspecified quadrant: Secondary | ICD-10-CM | POA: Diagnosis not present

## 2020-03-27 DIAGNOSIS — Z803 Family history of malignant neoplasm of breast: Secondary | ICD-10-CM | POA: Insufficient documentation

## 2020-03-27 DIAGNOSIS — Z170 Estrogen receptor positive status [ER+]: Secondary | ICD-10-CM | POA: Diagnosis not present

## 2020-03-27 LAB — COMPREHENSIVE METABOLIC PANEL
ALT: 29 U/L (ref 0–44)
AST: 34 U/L (ref 15–41)
Albumin: 4.3 g/dL (ref 3.5–5.0)
Alkaline Phosphatase: 89 U/L (ref 38–126)
Anion gap: 8 (ref 5–15)
BUN: 12 mg/dL (ref 8–23)
CO2: 26 mmol/L (ref 22–32)
Calcium: 9.4 mg/dL (ref 8.9–10.3)
Chloride: 108 mmol/L (ref 98–111)
Creatinine, Ser: 0.84 mg/dL (ref 0.44–1.00)
GFR calc Af Amer: >60 mL/min (ref >60)
GFR calc non Af Amer: >60 mL/min (ref >60)
Glucose, Bld: 100 mg/dL — ABNORMAL HIGH (ref 70–99)
Potassium: 3.7 mmol/L (ref 3.5–5.1)
Sodium: 142 mmol/L (ref 135–145)
Total Bilirubin: 0.8 mg/dL (ref 0.3–1.2)
Total Protein: 7.2 g/dL (ref 6.5–8.1)

## 2020-03-27 LAB — CBC WITH DIFFERENTIAL/PLATELET
Abs Immature Granulocytes: 0.01 10*3/uL (ref 0.00–0.07)
Basophils Absolute: 0.1 10*3/uL (ref 0.0–0.1)
Basophils Relative: 1 %
Eosinophils Absolute: 0.6 10*3/uL — ABNORMAL HIGH (ref 0.0–0.5)
Eosinophils Relative: 9 %
HCT: 42.9 % (ref 36.0–46.0)
Hemoglobin: 14.1 g/dL (ref 12.0–15.0)
Immature Granulocytes: 0 %
Lymphocytes Relative: 50 %
Lymphs Abs: 3.4 10*3/uL (ref 0.7–4.0)
MCH: 29.9 pg (ref 26.0–34.0)
MCHC: 32.9 g/dL (ref 30.0–36.0)
MCV: 90.9 fL (ref 80.0–100.0)
Monocytes Absolute: 0.5 10*3/uL (ref 0.1–1.0)
Monocytes Relative: 8 %
Neutro Abs: 2.1 10*3/uL (ref 1.7–7.7)
Neutrophils Relative %: 32 %
Platelets: 202 10*3/uL (ref 150–400)
RBC: 4.72 MIL/uL (ref 3.87–5.11)
RDW: 14 % (ref 11.5–15.5)
WBC: 6.7 10*3/uL (ref 4.0–10.5)
nRBC: 0 % (ref 0.0–0.2)

## 2020-03-27 MED ORDER — BUPROPION HCL ER (XL) 150 MG PO TB24: 150.0000 mg | ORAL_TABLET | Freq: Every day | ORAL | 0 refills | Status: DC

## 2020-03-30 ENCOUNTER — Telehealth: Payer: Self-pay | Admitting: Oncology

## 2020-03-30 NOTE — Telephone Encounter (Signed)
Scheduled appts per 4/16 los. Pt confirmed appt date and time.

## 2020-03-31 ENCOUNTER — Other Ambulatory Visit: Payer: Self-pay | Admitting: Oncology

## 2020-03-31 DIAGNOSIS — C50412 Malignant neoplasm of upper-outer quadrant of left female breast: Secondary | ICD-10-CM

## 2020-03-31 DIAGNOSIS — R531 Weakness: Secondary | ICD-10-CM | POA: Diagnosis not present

## 2020-03-31 DIAGNOSIS — N6310 Unspecified lump in the right breast, unspecified quadrant: Secondary | ICD-10-CM

## 2020-03-31 DIAGNOSIS — R262 Difficulty in walking, not elsewhere classified: Secondary | ICD-10-CM | POA: Diagnosis not present

## 2020-03-31 DIAGNOSIS — Z170 Estrogen receptor positive status [ER+]: Secondary | ICD-10-CM

## 2020-04-02 DIAGNOSIS — R531 Weakness: Secondary | ICD-10-CM | POA: Diagnosis not present

## 2020-04-02 DIAGNOSIS — R262 Difficulty in walking, not elsewhere classified: Secondary | ICD-10-CM | POA: Diagnosis not present

## 2020-04-07 DIAGNOSIS — R262 Difficulty in walking, not elsewhere classified: Secondary | ICD-10-CM | POA: Diagnosis not present

## 2020-04-07 DIAGNOSIS — E785 Hyperlipidemia, unspecified: Secondary | ICD-10-CM | POA: Diagnosis not present

## 2020-04-07 DIAGNOSIS — M1990 Unspecified osteoarthritis, unspecified site: Secondary | ICD-10-CM | POA: Diagnosis not present

## 2020-04-07 DIAGNOSIS — E663 Overweight: Secondary | ICD-10-CM | POA: Diagnosis not present

## 2020-04-07 DIAGNOSIS — E039 Hypothyroidism, unspecified: Secondary | ICD-10-CM | POA: Diagnosis not present

## 2020-04-07 DIAGNOSIS — R531 Weakness: Secondary | ICD-10-CM | POA: Diagnosis not present

## 2020-04-07 DIAGNOSIS — M8580 Other specified disorders of bone density and structure, unspecified site: Secondary | ICD-10-CM | POA: Diagnosis not present

## 2020-04-07 DIAGNOSIS — G4700 Insomnia, unspecified: Secondary | ICD-10-CM | POA: Diagnosis not present

## 2020-04-07 DIAGNOSIS — K5900 Constipation, unspecified: Secondary | ICD-10-CM | POA: Diagnosis not present

## 2020-04-07 DIAGNOSIS — C50412 Malignant neoplasm of upper-outer quadrant of left female breast: Secondary | ICD-10-CM | POA: Diagnosis not present

## 2020-04-07 DIAGNOSIS — K760 Fatty (change of) liver, not elsewhere classified: Secondary | ICD-10-CM | POA: Diagnosis not present

## 2020-04-07 DIAGNOSIS — R413 Other amnesia: Secondary | ICD-10-CM | POA: Diagnosis not present

## 2020-04-07 DIAGNOSIS — I700 Atherosclerosis of aorta: Secondary | ICD-10-CM | POA: Diagnosis not present

## 2020-04-07 DIAGNOSIS — F325 Major depressive disorder, single episode, in full remission: Secondary | ICD-10-CM | POA: Diagnosis not present

## 2020-04-08 ENCOUNTER — Ambulatory Visit
Admission: RE | Admit: 2020-04-08 | Discharge: 2020-04-08 | Disposition: A | Discharge status: Home / Self Care | Payer: Medicare Other | Source: Ambulatory Visit | Attending: Oncology | Admitting: Oncology

## 2020-04-08 ENCOUNTER — Other Ambulatory Visit (HOSPITAL_COMMUNITY): Payer: Self-pay | Admitting: Diagnostic Radiology

## 2020-04-08 ENCOUNTER — Other Ambulatory Visit: Payer: Self-pay

## 2020-04-08 DIAGNOSIS — C50412 Malignant neoplasm of upper-outer quadrant of left female breast: Secondary | ICD-10-CM

## 2020-04-08 DIAGNOSIS — R928 Other abnormal and inconclusive findings on diagnostic imaging of breast: Secondary | ICD-10-CM | POA: Diagnosis not present

## 2020-04-08 DIAGNOSIS — Z170 Estrogen receptor positive status [ER+]: Secondary | ICD-10-CM

## 2020-04-08 DIAGNOSIS — N6310 Unspecified lump in the right breast, unspecified quadrant: Secondary | ICD-10-CM

## 2020-04-08 DIAGNOSIS — N6011 Diffuse cystic mastopathy of right breast: Secondary | ICD-10-CM | POA: Diagnosis not present

## 2020-04-08 MED ORDER — GADOBUTROL 1 MMOL/ML IV SOLN
7.0000 mL | Freq: Once | INTRAVENOUS | Status: AC | PRN
  Administered 2020-04-08: 7 mL via INTRAVENOUS

## 2020-04-09 ENCOUNTER — Other Ambulatory Visit: Payer: Self-pay | Admitting: *Deleted

## 2020-04-09 MED ORDER — LETROZOLE 2.5 MG PO TABS: 2.5000 mg | ORAL_TABLET | Freq: Every day | ORAL | 4 refills | Status: DC

## 2020-04-09 MED ORDER — BUPROPION HCL ER (XL) 150 MG PO TB24: 150.0000 mg | ORAL_TABLET | Freq: Every day | ORAL | 0 refills | Status: DC

## 2020-04-10 ENCOUNTER — Other Ambulatory Visit: Payer: Self-pay | Admitting: *Deleted

## 2020-04-10 MED ORDER — BUPROPION HCL ER (XL) 150 MG PO TB24: 150.0000 mg | ORAL_TABLET | Freq: Every day | ORAL | 3 refills | Status: DC

## 2020-04-10 MED ORDER — LETROZOLE 2.5 MG PO TABS: 2.5000 mg | ORAL_TABLET | Freq: Every day | ORAL | 4 refills | Status: DC

## 2020-04-14 DIAGNOSIS — R262 Difficulty in walking, not elsewhere classified: Secondary | ICD-10-CM | POA: Diagnosis not present

## 2020-04-14 DIAGNOSIS — R531 Weakness: Secondary | ICD-10-CM | POA: Diagnosis not present

## 2020-04-17 DIAGNOSIS — R531 Weakness: Secondary | ICD-10-CM | POA: Diagnosis not present

## 2020-04-17 DIAGNOSIS — R262 Difficulty in walking, not elsewhere classified: Secondary | ICD-10-CM | POA: Diagnosis not present

## 2020-04-20 DIAGNOSIS — R531 Weakness: Secondary | ICD-10-CM | POA: Diagnosis not present

## 2020-04-20 DIAGNOSIS — R262 Difficulty in walking, not elsewhere classified: Secondary | ICD-10-CM | POA: Diagnosis not present

## 2020-04-23 ENCOUNTER — Other Ambulatory Visit: Payer: Self-pay | Admitting: *Deleted

## 2020-04-23 DIAGNOSIS — Z79811 Long term (current) use of aromatase inhibitors: Secondary | ICD-10-CM

## 2020-04-23 DIAGNOSIS — C50412 Malignant neoplasm of upper-outer quadrant of left female breast: Secondary | ICD-10-CM

## 2020-04-24 ENCOUNTER — Other Ambulatory Visit: Payer: Self-pay | Admitting: *Deleted

## 2020-04-24 MED ORDER — BUPROPION HCL ER (XL) 150 MG PO TB24: 150.0000 mg | ORAL_TABLET | Freq: Every day | ORAL | 3 refills | Status: DC

## 2020-04-28 ENCOUNTER — Other Ambulatory Visit: Payer: Self-pay | Admitting: *Deleted

## 2020-04-29 ENCOUNTER — Other Ambulatory Visit: Payer: Self-pay | Admitting: *Deleted

## 2020-04-29 MED ORDER — BUPROPION HCL ER (XL) 150 MG PO TB24: 150.0000 mg | ORAL_TABLET | Freq: Every day | ORAL | 3 refills | Status: DC

## 2020-04-30 DIAGNOSIS — R531 Weakness: Secondary | ICD-10-CM | POA: Diagnosis not present

## 2020-04-30 DIAGNOSIS — R262 Difficulty in walking, not elsewhere classified: Secondary | ICD-10-CM | POA: Diagnosis not present

## 2020-05-05 DIAGNOSIS — D1801 Hemangioma of skin and subcutaneous tissue: Secondary | ICD-10-CM | POA: Diagnosis not present

## 2020-05-05 DIAGNOSIS — R531 Weakness: Secondary | ICD-10-CM | POA: Diagnosis not present

## 2020-05-05 DIAGNOSIS — L821 Other seborrheic keratosis: Secondary | ICD-10-CM | POA: Diagnosis not present

## 2020-05-05 DIAGNOSIS — L820 Inflamed seborrheic keratosis: Secondary | ICD-10-CM | POA: Diagnosis not present

## 2020-05-05 DIAGNOSIS — Z85828 Personal history of other malignant neoplasm of skin: Secondary | ICD-10-CM | POA: Diagnosis not present

## 2020-05-05 DIAGNOSIS — L814 Other melanin hyperpigmentation: Secondary | ICD-10-CM | POA: Diagnosis not present

## 2020-05-05 DIAGNOSIS — L905 Scar conditions and fibrosis of skin: Secondary | ICD-10-CM | POA: Diagnosis not present

## 2020-05-05 DIAGNOSIS — R262 Difficulty in walking, not elsewhere classified: Secondary | ICD-10-CM | POA: Diagnosis not present

## 2020-05-05 DIAGNOSIS — D225 Melanocytic nevi of trunk: Secondary | ICD-10-CM | POA: Diagnosis not present

## 2020-05-05 DIAGNOSIS — D485 Neoplasm of uncertain behavior of skin: Secondary | ICD-10-CM | POA: Diagnosis not present

## 2020-05-07 DIAGNOSIS — H1045 Other chronic allergic conjunctivitis: Secondary | ICD-10-CM | POA: Diagnosis not present

## 2020-05-20 DIAGNOSIS — M79661 Pain in right lower leg: Secondary | ICD-10-CM | POA: Diagnosis not present

## 2020-05-20 DIAGNOSIS — R260 Ataxic gait: Secondary | ICD-10-CM | POA: Diagnosis not present

## 2020-05-21 DIAGNOSIS — H52223 Regular astigmatism, bilateral: Secondary | ICD-10-CM | POA: Diagnosis not present

## 2020-05-21 DIAGNOSIS — H5213 Myopia, bilateral: Secondary | ICD-10-CM | POA: Diagnosis not present

## 2020-05-21 DIAGNOSIS — H04123 Dry eye syndrome of bilateral lacrimal glands: Secondary | ICD-10-CM | POA: Diagnosis not present

## 2020-05-21 DIAGNOSIS — H524 Presbyopia: Secondary | ICD-10-CM | POA: Diagnosis not present

## 2020-05-22 DIAGNOSIS — R260 Ataxic gait: Secondary | ICD-10-CM | POA: Diagnosis not present

## 2020-05-22 DIAGNOSIS — M79661 Pain in right lower leg: Secondary | ICD-10-CM | POA: Diagnosis not present

## 2020-05-26 DIAGNOSIS — R260 Ataxic gait: Secondary | ICD-10-CM | POA: Diagnosis not present

## 2020-05-26 DIAGNOSIS — M79661 Pain in right lower leg: Secondary | ICD-10-CM | POA: Diagnosis not present

## 2020-06-28 NOTE — Progress Notes (Signed)
[place]  [phone] [phone]     ID: [name] DOB: 21-Oct-1946  MR#: [id number]  DHR#:[id number]  Patient Care Team: Shon Baton, MD as PCP - General Rolm Bookbinder, MD as Consulting Physician (General Surgery) Magrinat, Virgie Dad, MD as Consulting Physician (Oncology) Eppie Gibson, MD as Attending Physician (Radiation Oncology) Lelon Perla, MD as Consulting Physician (Cardiology) [name], MD as Consulting Physician (Obstetrics and Gynecology) Jacquelynn Cree, PT as Physical Therapist (Physical Therapy) Donzetta Sprung., MD as Referring Physician (Sports Medicine) OTHER MD:  CHIEF COMPLAINT: Estrogen receptor positive breast cancer  CURRENT TREATMENT: letrozole every Mon/Wed/Fri   INTERVAL HISTORY: [name] returns today for follow-up of her estrogen receptor positive breast cancer accompanied by her husband [name] (who incidentally just underwent a procedure in [place] and week ago; he is doing quite well at present).  She continues on letrozole which she takes Monday Wednesday Friday.  She is wondering whether this is enough and whether she should try to increase her letrozole at this point  Since her last visit, she underwent right upper-outer quadrant biopsies on 04/08/2020. Pathology from the procedure ([id number]) showed only fibrocystic change.   REVIEW OF SYSTEMS: [name] does me she had a horrible experience at these most recent biopsies.  Dr. Duanne Limerick used a drill.  She tells me she does not want to see this particular doctor in the future for biopsies and would not ever go under a drill again.  She might be willing to undergo biopsies and MRIs as needed.  She tells me the Wellbutrin is making her feel less vulnerable or less fragile, but it is also giving her tinnitus and making her feel dizzy so she would like to come off it.  They spent a [place] at the beach with family and that was a good experience.  She  has significant sciatica on the right.  This is not a new problem.  A detailed review of systems today was otherwise stable.    HISTORY OF CURRENT ILLNESS: From the original intake note:  "[name]" had routine diagnostic mammography on 03/08/2018 showing breast density category D.  There was a 1.5 cm irregular mass in the left upper outer quadrant posterior depth.  This was further imaged with ultrasonography on the same day.  The left axilla was sonographically benign.  No abnormalities were seen in the left axilla.  Accordingly on 03/08/2018 she proceeded to biopsy of the left breast area in question. The pathology from this procedure showed ([id number]): Invasive ductal carcinoma, grade I-II. HER-2 negative with the ratio being 1.61 and number per cell 3.30.  The tumor was estrogen receptor 100% positive and progesterone receptor 100% positive, both with strong staining intensity.  Ki67 was <1%.   On 03/18/2018, she underwent a bilateral breast MRI with and without contrast, showing the mass in question in a setting of 3.3 cm non-masslike enhancement in the posterior aspect of the left upper-outer quadrant. in addition there was a 1.1 cm mass in the left upper-outer quadrant located 2.1 cm anterior and lateral to the biopsy proven malignant.   Right breast showed two indeterminate 5 mm adjacent enhancing nodules in the upper-inner quadrant.   The patient's subsequent history is as detailed below.   PAST MEDICAL HISTORY: Past Medical History:  Diagnosis Date  . Arthritis    hands and knees  . Complication of anesthesia    aspiration pna; following a colonoscopy, pt requests head of bed elevated if possible.  Marland Kitchen  Constipation   . Cough   . Depression   . Dysrhythmia    RBBB on 06-06-17 ekg   . Elevated liver function tests   . Esophageal stricture   . Family history of breast cancer   . Gallstones   . Genetic testing 04/02/2018   STAT Breast panel with reflex to Multi-Cancer panel (83  genes) @ [facility] - No pathogenic mutations detected  . GERD (gastroesophageal reflux disease)   . Hiatal hernia   . History of kidney stones   . History of radiation therapy 07/09/2018- 08/03/18   Left Breast/ 40.05 Gy in 15 fractions. Left Breast boost 10 Gy in 5 fractions.   . Hyperlipemia   . Hypothyroidism   . Insomnia   . Malignant neoplasm of upper-outer quadrant of left female breast ([place])   . Nephrolithiasis   . Pneumonia 2015   aspirated after colonosopy  . Renal cyst     PAST SURGICAL HISTORY: Past Surgical History:  Procedure Laterality Date  . BREAST BIOPSY Left 05/31/2019   mri  . BREAST BIOPSY Left 04/2019   malignant  . BREAST LUMPECTOMY Left 04/17/2018   malignant  . BREAST LUMPECTOMY WITH RADIOACTIVE SEED AND SENTINEL LYMPH NODE BIOPSY Left 04/17/2018   Procedure: LEFT BREAST LUMPECTOMY WITH BRACKETED RADIOACTIVE SEEDS AND SENTINEL LYMPH NODE BIOPSY;  Surgeon: Rolm Bookbinder, MD;  Location: [facility];  Service: General;  Laterality: Left;  . BREAST SURGERY Left    Lumpectomy  . CARDIOVASCULAR STRESS TEST  07/19/2006   EF 70%, NO EVIDENCE OF ISCHEMIA  . CHOLECYSTECTOMY  12/30/10  . COLONOSCOPY    . DILATION AND CURETTAGE OF UTERUS    . HYSTEROSCOPY WITH D & C N/A 06/07/2018   Procedure: DILATATION AND CURETTAGE /HYSTEROSCOPY;  Surgeon: [name], MD;  Location: [facility];  Service: Gynecology;  Laterality: N/A;  . inguinal herniography    . NASAL SINUS SURGERY    . RADIOACTIVE SEED GUIDED EXCISIONAL BREAST BIOPSY Right 04/17/2018   Procedure: RIGHT BREAST SEED GUIDED EXCISIONAL BIOPSY;  Surgeon: Rolm Bookbinder, MD;  Location: [facility];  Service: General;  Laterality: Right;  . RE-EXCISION OF BREAST LUMPECTOMY Left 05/15/2018   Procedure: RE-EXCISION OF LEFT BREAST LUMPECTOMY, ASPIRATION OF LEFT AXILLARY SEROMA;  Surgeon: Rolm Bookbinder, MD;  Location: [facility];  Service: General;  Laterality: Left;  . T &  A    . TOOTH EXTRACTION    . TOTAL KNEE ARTHROPLASTY Left 09/11/2017   Procedure: LEFT TOTAL KNEE ARTHROPLASTY;  Surgeon: Gaynelle Arabian, MD;  Location: [facility];  Service: Orthopedics;  Laterality: Left;  with block  . US ECHOCARDIOGRAPHY  07/17/2006   EF 55-60%    FAMILY HISTORY Family History  Problem Relation Age of Onset  . Breast cancer Mother 27       deceased 59; TAH/BSO in 60s/50s  . Dementia Father        deceased 76  . Breast cancer Maternal Aunt 82       deceased 109s  . Colon cancer Neg Hx   . Stomach cancer Neg Hx    Her father died of old age at 15 years old. Her mother had breast cancer at 49 and died at 74 years old. She will undergo genetic testing. Her maternal aunt had breast cancer at 20 years old. No family history of ovarian cancer. She had one brother and one sister.    GYNECOLOGIC HISTORY:  No LMP recorded. Patient is postmenopausal. Menarche: 74  years old [place] P 0 LMP: 1997 Contraceptive: 2 years HRT: Estrogen and progesterone for about 7 years  Hysterectomy? No   SOCIAL HISTORY:  She is a retired Futures trader. She has been married to [place], for 45 plus years. He worked in the [employer]. Together they adopted two children. One daughter, [name], 54 of [place], who is a Psychologist, sport and exercise for an OBGYN, and one son, Lashaye Fisk, 36 of [place], who owns a Marketing executive business. The patient has three grandchildren. She attends [employer].    ADVANCED DIRECTIVES: In place   HEALTH MAINTENANCE: Social History   Tobacco Use  . Smoking status: Never Smoker  . Smokeless tobacco: Never Used  Vaping Use  . Vaping Use: Never used  Substance Use Topics  . Alcohol use: No  . Drug use: No    Colonoscopy: 2014/ [facility]  PAP: 2018  Bone density:  May 2014 at [facility] showed a T score of -0.9 normal   Allergies  Allergen Reactions  . Oxycontin [Oxycodone Hcl]     "it made me feel very high and hyper"   . Codeine Nausea  And Vomiting    Current Outpatient Medications  Medication Sig Dispense Refill  . acetaminophen (TYLENOL) 500 MG tablet Take 500 mg by mouth every 6 (six) hours as needed for mild pain or headache.    Marland Kitchen azelastine (ASTELIN) 0.1 % nasal spray Place 1 spray into both nostrils 2 (two) times daily. Use in each nostril as directed    . BIOTIN PO Take 1 tablet by mouth daily.    Marland Kitchen buPROPion (WELLBUTRIN XL) 150 MG 24 hr tablet Take 1 tablet (150 mg total) by mouth daily. 90 tablet 3  . Cholecalciferol (VITAMIN D3) 1000 units CAPS Take 1 capsule by mouth 2 (two) times daily.     Marland Kitchen escitalopram (LEXAPRO) 10 MG tablet Take 15 mg by mouth daily.     Marland Kitchen esomeprazole (NEXIUM) 40 MG capsule Take 40 mg by mouth 2 (two) times daily before a meal.    . ezetimibe (ZETIA) 10 MG tablet Take 10 mg by mouth at bedtime.    . fluticasone (FLONASE) 50 MCG/ACT nasal spray Place 1 spray into both nostrils daily.    Marland Kitchen letrozole (FEMARA) 2.5 MG tablet Take 1 tablet (2.5 mg total) by mouth daily. Start 08/13/2019 90 tablet 4  . levothyroxine (SYNTHROID, LEVOTHROID) 50 MCG tablet Take 50 mcg by mouth at bedtime.    . Magnesium 250 MG TABS Take 1 tablet by mouth daily.     . naproxen sodium (ALEVE) 220 MG tablet Take 220 mg by mouth 2 (two) times daily as needed (pain).    Marland Kitchen OVER THE COUNTER MEDICATION Apply 1 application topically daily as needed (pain). Natural pain relief ointment    . Probiotic Product (PROBIOTIC ADVANCED PO) Take 1 tablet by mouth daily.    . rosuvastatin (CRESTOR) 5 MG tablet Take 5 mg by mouth at bedtime.     . sodium chloride (OCEAN) 0.65 % SOLN nasal spray Place 4 sprays into both nostrils 4 (four) times daily.    . SUPER B COMPLEX/C PO Take 1 tablet by mouth daily.    Marland Kitchen zolpidem (AMBIEN) 10 MG tablet zolpidem 10 mg tablet  take 1 tablet by mouth at bedtime if needed     No current facility-administered medications for this visit.    OBJECTIVE: white woman who appears younger than stated  age 36:   06/29/20 1301  BP: 103/89  Pulse: 85  Resp: 18  Temp: 98.3 F (36.8 C)  SpO2: 99%     Body mass index is 23.81 kg/m.   Wt Readings from Last 3 Encounters:  06/29/20 156 lb 9.6 oz (71 kg)  03/27/20 160 lb 8 oz (72.8 kg)  11/26/19 164 lb 6.4 oz (74.6 kg)      ECOG FS:2 - Symptomatic, <50% confined to bed  Sclerae unicteric, EOMs intact Wearing a mask No cervical or supraclavicular adenopathy Lungs no rales or rhonchi Heart regular rate and rhythm Abd soft, nontender, positive bowel sounds MSK no focal spinal tenderness, no upper extremity lymphedema Neuro: nonfocal, well oriented, appropriate affect Breasts: Status post bilateral mastectomies and left-sided radiation.  I do not palpate any areas suspicious for recurrent or residual disease.  Both axillae are benign.   LAB RESULTS:  CMP     Component Value Date/Time   NA 142 03/27/2020 0900   NA 144 06/06/2017 1448   K 3.7 03/27/2020 0900   CL 108 03/27/2020 0900   CO2 26 03/27/2020 0900   GLUCOSE 100 (H) 03/27/2020 0900   BUN 12 03/27/2020 0900   BUN 12 06/06/2017 1448   CREATININE 0.84 03/27/2020 0900   CREATININE 0.80 11/26/2019 1024   CALCIUM 9.4 03/27/2020 0900   PROT 7.2 03/27/2020 0900   PROT 6.3 06/06/2017 1448   ALBUMIN 4.3 03/27/2020 0900   ALBUMIN 4.4 06/06/2017 1448   AST 34 03/27/2020 0900   AST 45 (H) 11/26/2019 1024   ALT 29 03/27/2020 0900   ALT 31 11/26/2019 1024   ALKPHOS 89 03/27/2020 0900   BILITOT 0.8 03/27/2020 0900   BILITOT 0.7 11/26/2019 1024   GFRNONAA >60 03/27/2020 0900   GFRNONAA >60 11/26/2019 1024   GFRAA >60 03/27/2020 0900   GFRAA >60 11/26/2019 1024    No results found for: TOTALPROTELP, ALBUMINELP, A1GS, A2GS, BETS, BETA2SER, GAMS, MSPIKE, SPEI  No results found for: KPAFRELGTCHN, LAMBDASER, KAPLAMBRATIO  Lab Results  Component Value Date   WBC 7.3 06/29/2020   NEUTROABS 3.3 06/29/2020   HGB 13.8 06/29/2020   HCT 41.2 06/29/2020   MCV 91.4  06/29/2020   PLT 207 06/29/2020   No results found for: LABCA2  No components found for: GQQPYP950  No results for input(s): INR in the last 168 hours.  No results found for: LABCA2  No results found for: DTO671  No results found for: IWP809  No results found for: XIP382  No results found for: CA2729  No components found for: HGQUANT  No results found for: CEA1 / No results found for: CEA1   No results found for: AFPTUMOR  No results found for: CHROMOGRNA  No results found for: HGBA, HGBA2QUANT, HGBFQUANT, HGBSQUAN (Hemoglobinopathy evaluation)   No results found for: LDH  No results found for: IRON, TIBC, IRONPCTSAT (Iron and TIBC)  No results found for: FERRITIN  Urinalysis    Component Value Date/Time   COLORURINE YELLOW 08/22/2016 1947   APPEARANCEUR CLEAR 08/22/2016 1947   LABSPEC 1.026 08/22/2016 1947   PHURINE 5.5 08/22/2016 1947   GLUCOSEU NEGATIVE 08/22/2016 1947   HGBUR SMALL (A) 08/22/2016 1947   BILIRUBINUR NEGATIVE 08/22/2016 [place] 08/22/2016 1947   PROTEINUR NEGATIVE 08/22/2016 1947   UROBILINOGEN 0.2 08/22/2016 1622   NITRITE NEGATIVE 08/22/2016 1947   LEUKOCYTESUR SMALL (A) 08/22/2016 1947    STUDIES: No results found.   ASSESSMENT: 74 y.o. [place] woman status post left breast upper outer quadrant biopsy 03/08/2018  for a clinical T1c-T2 N0, stage I invasive ductal carcinoma, grade 1 or 2, estrogen and progesterone receptor positive, HER-2 not amplified, with an MIB-1 of less than 1%  (1) additional biopsies as follows:  (a) right breast biopsy ([id number]) on 04/02/2018 showed atypical lobular hyperplasia  (b) left breast ([id number]) on 03/22/2018 showed invasive lobular carcinoma, grade 2, estrogen and progesterone receptor positive with an MIB-1 of 5%. HER-2 not amplified  (2) bilateral lumpectomies and left sentinel lymph node sampling 04/17/2018 found  (a) on the right side, atypical lobular  hyperplasia  (b) on the left side, an mpT1c pN0, stage IA, grade 2 invasive lobular carcinoma, with positive margins  (c) additional left breast surgery on 05/15/2018 cleared the compromise margins  (3) The Oncotype DX score was 14, predicting a risk of outside the breast recurrence over the next 9 years of 4% if the patient's only systemic therapy is tamoxifen for 5 years.  It also predicts no significant benefit from chemotherapy.  (4) adjuvant radiation 07/09/2018 - 08/03/2018 Site/dose:    1. Left Breast / 40.05 Gy in 15 fractions 2. Left Breast Boost / 10 Gy in 5 fractions  (5) started tamoxifen 09/11/2018, discontinued June 2020 with cognitive issues   (6) genetics testing 03/22/2018 through [facility]'s STAT Breast panel with reflex to Multi-Cancer panel found no pathogenic mutations in (ALK, APC, ATM, AXIN2, BAP1, BARD1, BLM, BMPR1A, BRCA1, BRCA2, BRIP1, CASR, CDC73, CDH1, CDK4, CDKN1B, CDKN1C, CDKN2A, CEBPA, CHEK2, CTNNA1, DICER1, DIS3L2, EGFR, EPCAM, FH, FLCN, GATA2, GPC3, GREM1, HOXB13, HRAS, KIT, MAX, MEN1, MET, MITF, MLH1, MSH2, MSH3, MSH6, MUTYH, NBN, NF1, NF2, NTHL1, PALB2, PDGFRA, PHOX2B, PMS2, POLD1, POLE, POT1, PRKAR1A, PTCH1, PTEN, RAD50, RAD51C, RAD51D, RB1, RECQL4, RET, RUNX1, SDHA, SDHAF2, SDHB, SDHC, SDHD, SMAD4, SMARCA4, SMARCB1, SMARCE1, STK11, SUFU, TERC, TERT, TMEM127, TP53, TSC1, TSC2, VHL, WRN, WT1).  (a) Variants of Uncertain Significance were found in in PDGFRA and RECQL4.  (7) started letrozole 09/26/2019, discontinued 11/26/2019 with cognitive issues  (a) bone density May 2014 normal, T score -0.9  (b) letrozole resumed 2020-01-22, taken Mondays, Wednesday, Friday, as being off medication made no difference to either hot flashes or cognitive deficiencies   PLAN: [name] did experience some benefit from the Wellbutrin, but this is offset by the side effects and she really would like to get off it.  She is basically going to take it every other day for a week and then  stop.  I do not anticipate this will be a problem since she is on a very low dose already.  I would like her indeed to increase her letrozole which she is eager to do.  However I would not like that to overlap with the Wellbutrin question so she is going to wait until 07/13/2020 and then she will start taking Wellbutrin every other day.  She will call us August 16 and let us know how that is going.  If that is going well then we will add 1 more for the next week namely she would take it Monday Tuesday Wednesday and then Friday and Sunday and if that week goes well then she will start taking it every other day the following week.  We discussed intensified screening.  As she and [name] both have a very good understanding of what false positives are.  She is very interested in continuing intensified screening.  She will be due for mammography in October and I will see her after that.  Then she will have her next MRI in April  of next year  She is planning to start water aerobics and other's pool activities which I think will be very helpful.  She knows to call for any other issue that may develop before the next visit.  Total encounter time 30 minutes.*   Magrinat, Virgie Dad, MD  06/29/20 1:39 PM Medical Oncology and Hematology [facility] [place], [place] [address] Tel. [phone]    Fax. [phone]   I, Wilburn Mylar, am acting as scribe for Dr. Virgie Dad. Magrinat.  I, Lurline Del MD, have reviewed the above documentation for accuracy and completeness, and I agree with the above.   *Total Encounter Time as defined by the Centers for Medicare and Medicaid Services includes, in addition to the face-to-face time of a patient visit (documented in the note above) non-face-to-face time: obtaining and reviewing outside history, ordering and reviewing medications, tests or procedures, care coordination (communications with other health care professionals or  caregivers) and documentation in the medical record.

## 2020-06-29 ENCOUNTER — Other Ambulatory Visit: Payer: Self-pay

## 2020-06-29 ENCOUNTER — Inpatient Hospital Stay: Payer: Medicare Other | Attending: Oncology

## 2020-06-29 ENCOUNTER — Inpatient Hospital Stay (HOSPITAL_BASED_OUTPATIENT_CLINIC_OR_DEPARTMENT_OTHER): Payer: Medicare Other | Admitting: Oncology

## 2020-06-29 VITALS — BP 103/89 | HR 85 | Temp 98.3°F | Resp 18 | Ht 68.0 in | Wt 156.6 lb

## 2020-06-29 DIAGNOSIS — C50412 Malignant neoplasm of upper-outer quadrant of left female breast: Secondary | ICD-10-CM

## 2020-06-29 DIAGNOSIS — Z8349 Family history of other endocrine, nutritional and metabolic diseases: Secondary | ICD-10-CM | POA: Insufficient documentation

## 2020-06-29 DIAGNOSIS — Z170 Estrogen receptor positive status [ER+]: Secondary | ICD-10-CM | POA: Insufficient documentation

## 2020-06-29 DIAGNOSIS — Z79811 Long term (current) use of aromatase inhibitors: Secondary | ICD-10-CM | POA: Diagnosis not present

## 2020-06-29 DIAGNOSIS — Z803 Family history of malignant neoplasm of breast: Secondary | ICD-10-CM | POA: Insufficient documentation

## 2020-06-29 LAB — CBC WITH DIFFERENTIAL/PLATELET
Abs Immature Granulocytes: 0.02 10*3/uL (ref 0.00–0.07)
Basophils Absolute: 0.1 10*3/uL (ref 0.0–0.1)
Basophils Relative: 1 %
Eosinophils Absolute: 0.4 10*3/uL (ref 0.0–0.5)
Eosinophils Relative: 6 %
HCT: 41.2 % (ref 36.0–46.0)
Hemoglobin: 13.8 g/dL (ref 12.0–15.0)
Immature Granulocytes: 0 %
Lymphocytes Relative: 38 %
Lymphs Abs: 2.8 10*3/uL (ref 0.7–4.0)
MCH: 30.6 pg (ref 26.0–34.0)
MCHC: 33.5 g/dL (ref 30.0–36.0)
MCV: 91.4 fL (ref 80.0–100.0)
Monocytes Absolute: 0.7 10*3/uL (ref 0.1–1.0)
Monocytes Relative: 9 %
Neutro Abs: 3.3 10*3/uL (ref 1.7–7.7)
Neutrophils Relative %: 46 %
Platelets: 207 10*3/uL (ref 150–400)
RBC: 4.51 MIL/uL (ref 3.87–5.11)
RDW: 13.5 % (ref 11.5–15.5)
WBC: 7.3 10*3/uL (ref 4.0–10.5)
nRBC: 0 % (ref 0.0–0.2)

## 2020-06-29 LAB — COMPREHENSIVE METABOLIC PANEL
ALT: 26 U/L (ref 0–44)
AST: 37 U/L (ref 15–41)
Albumin: 4.3 g/dL (ref 3.5–5.0)
Alkaline Phosphatase: 87 U/L (ref 38–126)
Anion gap: 9 (ref 5–15)
BUN: 12 mg/dL (ref 8–23)
CO2: 27 mmol/L (ref 22–32)
Calcium: 10.4 mg/dL — ABNORMAL HIGH (ref 8.9–10.3)
Chloride: 105 mmol/L (ref 98–111)
Creatinine, Ser: 0.88 mg/dL (ref 0.44–1.00)
GFR calc Af Amer: >60 mL/min (ref >60)
GFR calc non Af Amer: >60 mL/min (ref >60)
Glucose, Bld: 92 mg/dL (ref 70–99)
Potassium: 4.8 mmol/L (ref 3.5–5.1)
Sodium: 141 mmol/L (ref 135–145)
Total Bilirubin: 0.6 mg/dL (ref 0.3–1.2)
Total Protein: 7.2 g/dL (ref 6.5–8.1)

## 2020-06-30 DIAGNOSIS — M79661 Pain in right lower leg: Secondary | ICD-10-CM | POA: Diagnosis not present

## 2020-06-30 DIAGNOSIS — R260 Ataxic gait: Secondary | ICD-10-CM | POA: Diagnosis not present

## 2020-07-03 DIAGNOSIS — M79661 Pain in right lower leg: Secondary | ICD-10-CM | POA: Diagnosis not present

## 2020-07-03 DIAGNOSIS — R260 Ataxic gait: Secondary | ICD-10-CM | POA: Diagnosis not present

## 2020-07-08 ENCOUNTER — Ambulatory Visit (INDEPENDENT_AMBULATORY_CARE_PROVIDER_SITE_OTHER): Payer: Medicare Other

## 2020-07-08 ENCOUNTER — Ambulatory Visit (INDEPENDENT_AMBULATORY_CARE_PROVIDER_SITE_OTHER): Payer: Medicare Other | Admitting: Orthopaedic Surgery

## 2020-07-08 ENCOUNTER — Ambulatory Visit: Payer: Self-pay

## 2020-07-08 DIAGNOSIS — M25561 Pain in right knee: Secondary | ICD-10-CM | POA: Diagnosis not present

## 2020-07-08 DIAGNOSIS — M5431 Sciatica, right side: Secondary | ICD-10-CM | POA: Diagnosis not present

## 2020-07-08 DIAGNOSIS — M25562 Pain in left knee: Secondary | ICD-10-CM | POA: Diagnosis not present

## 2020-07-08 DIAGNOSIS — Z96652 Presence of left artificial knee joint: Secondary | ICD-10-CM

## 2020-07-08 MED ORDER — METHYLPREDNISOLONE 4 MG PO TABS: ORAL_TABLET | ORAL | 0 refills | Status: DC

## 2020-07-08 NOTE — Progress Notes (Signed)
Office Visit Note   Patient: [name]           Date of Birth: 1946/08/05           MRN: [id number] Visit Date: 07/08/2020              Requested by: Shon Baton, [address] [place],  [place] [id number] PCP: Shon Baton, MD   Assessment & Plan: Visit Diagnoses:  1. Right knee pain, unspecified chronicity   2. Left knee pain, unspecified chronicity   3. Sciatica, right side   4. History of left knee replacement     Plan: From a standpoint of her right low back pain and right-sided sciatica, a MRI is medically warranted at this standpoint given her continued pain and sciatic symptoms combined with the failure of all conservative treatment measures including activity modification, rest, anti-inflammatories and physical therapy as well as chiropractic treatment.  From a left knee standpoint, at some point she may end up needing an open arthrotomy to assess the knee in terms of upsizing her polyliner.  I gave him reassurance that the metal components themselves look fine but I have had patients such as this before that have benefited from a polyliner upsizing just because some simple instability that is subtle can cause the pain that she is having.  We need to explore the sciatic component of her pain first with the MRI of her lumbar spine.  All question concerns were answered and addressed.  I will start a 6-day steroid taper while we await the MRI results.  Follow-Up Instructions: Return in about 2 weeks (around 07/22/2020).   Orders:  Orders Placed This Encounter  Procedures  . XR Knee 1-2 Views Left  . XR Lumbar Spine 2-3 Views   Meds ordered this encounter  Medications  . methylPREDNISolone (MEDROL) 4 MG tablet    Sig: Medrol dose pack. Take as instructed    Dispense:  21 tablet    Refill:  0      Procedures: No procedures performed   Clinical Data: No additional findings.   Subjective: Chief Complaint  Patient presents with  . Right Knee - Pain  The  patient is a very pleasant 74 year old female seen today in the office for evaluation treatment of right-sided sciatica and left knee pain.  She is accompanied by her husband today.  She has a long and frustrating history as it relates to her left knee replacement.  It is frustrating for her.  She is hurt ever since the surgery.  She is felt some instability with that knee as well.  It does ache with mechanical types of activities and she says the knee does clock.  This knee was replaced in 2018 by one of my colleagues in town.  He then performed a arthroscopic intervention of that knee appropriately for patella clunk.  She still has clunking with the knee itself on the left side.  She is also dealt with significant sciatica on the right side.  She has been through significant physical therapy as it relates to her back and her knees.  She has been through several different highly claimed and accredited physical therapist in the city as well as even chiropractic treatment.  Her pain is daily and has become quite severe.  The right side sciatica is bothersome to her.  This does change her body mechanics as she walks favoring her left knee and also favoring her right side.  Just even seeing her in  the office she can tell that she is just fatigued as it relates to the chronic pain that she deals with.  She is not on chronic pain medication which is fortunate and there is nothing that is masking this pain.  She is not a diabetic and not a smoker.  Her husband is with her and very supportive.  HPI  Review of Systems There is currently no listed headache, chest pain, shortness of breath, fever, chills, nausea, vomiting  Objective: Vital Signs: There were no vitals taken for this visit.  Physical Exam She is alert and orient x3 and in no acute distress.  She is a thin individual. Ortho Exam Examination of her left knee shows a well-healed surgical incision.  There is no significant effusion.  She has excellent  full range of motion of her knee and her husband and her report that she has had excellent range of motion from day 1.  There is a little bit of play in that knee with varus and valgus stressing and the patella seems to track centrally but there is certainly a clunk.  There is a little bit of play with anterior drawer.  Examination of the right lower extremity shows a positive straight leg raise and some weakness with numbness and tingling. Specialty Comments:  No specialty comments available.  Imaging: XR Knee 1-2 Views Left  Result Date: 07/08/2020 2 views of the left knee show well-seated total knee arthroplasty with excellent alignment and no evidence of loosening or osteolysis.  XR Lumbar Spine 2-3 Views  Result Date: 07/08/2020 2 views of the lumbar spine show no acute findings.    PMFS History: Patient Active Problem List   Diagnosis Date Noted  . Breast mass, right 03/27/2020  . Hepatic steatosis 01/22/2020  . Genetic testing 04/02/2018  . Family history of breast cancer   . Malignant neoplasm of upper-outer quadrant of left breast in female, estrogen receptor positive ([place]) 03/16/2018  . OA (osteoarthritis) of knee 09/11/2017  . Infrapatellar bursitis of right knee 04/29/2014  . Pneumonia, aspiration ([place]) 12/28/2012  . HYPOTHYROIDISM 11/15/2010  . HYPERLIPIDEMIA 11/15/2010  . DEPRESSION 11/15/2010  . GERD 11/15/2010  . CONSTIPATION 11/15/2010  . GALLSTONES 11/15/2010  . RENAL CYST 11/15/2010  . INSOMNIA UNSPECIFIED 11/15/2010  . COUGH 11/15/2010  . ABDOMINAL PAIN OTHER SPECIFIED SITE 11/15/2010  . TRANSAMINASES, SERUM, ELEVATED 11/15/2010   Past Medical History:  Diagnosis Date  . Arthritis    hands and knees  . Complication of anesthesia    aspiration pna; following a colonoscopy, pt requests head of bed elevated if possible.  . Constipation   . Cough   . Depression   . Dysrhythmia    RBBB on 06-06-17 ekg   . Elevated liver function tests   . Esophageal  stricture   . Family history of breast cancer   . Gallstones   . Genetic testing 04/02/2018   STAT Breast panel with reflex to Multi-Cancer panel (83 genes) @ [facility] - No pathogenic mutations detected  . GERD (gastroesophageal reflux disease)   . Hiatal hernia   . History of kidney stones   . History of radiation therapy 07/09/2018- 08/03/18   Left Breast/ 40.05 Gy in 15 fractions. Left Breast boost 10 Gy in 5 fractions.   . Hyperlipemia   . Hypothyroidism   . Insomnia   . Malignant neoplasm of upper-outer quadrant of left female breast ([place])   . Nephrolithiasis   . Pneumonia 2015   aspirated after colonosopy  .  Renal cyst     Family History  Problem Relation Age of Onset  . Breast cancer Mother 60       deceased 28; TAH/BSO in 66s/50s  . Dementia Father        deceased 35  . Breast cancer Maternal Aunt 82       deceased 40s  . Colon cancer Neg Hx   . Stomach cancer Neg Hx     Past Surgical History:  Procedure Laterality Date  . BREAST BIOPSY Left 05/31/2019   mri  . BREAST BIOPSY Left 04/2019   malignant  . BREAST LUMPECTOMY Left 04/17/2018   malignant  . BREAST LUMPECTOMY WITH RADIOACTIVE SEED AND SENTINEL LYMPH NODE BIOPSY Left 04/17/2018   Procedure: LEFT BREAST LUMPECTOMY WITH BRACKETED RADIOACTIVE SEEDS AND SENTINEL LYMPH NODE BIOPSY;  Surgeon: Rolm Bookbinder, MD;  Location: [facility];  Service: General;  Laterality: Left;  . BREAST SURGERY Left    Lumpectomy  . CARDIOVASCULAR STRESS TEST  07/19/2006   EF 70%, NO EVIDENCE OF ISCHEMIA  . CHOLECYSTECTOMY  12/30/10  . COLONOSCOPY    . DILATION AND CURETTAGE OF UTERUS    . HYSTEROSCOPY WITH D & C N/A 06/07/2018   Procedure: DILATATION AND CURETTAGE /HYSTEROSCOPY;  Surgeon: [name], MD;  Location: [facility];  Service: Gynecology;  Laterality: N/A;  . inguinal herniography    . NASAL SINUS SURGERY    . RADIOACTIVE SEED GUIDED EXCISIONAL BREAST BIOPSY Right 04/17/2018   Procedure: RIGHT BREAST SEED  GUIDED EXCISIONAL BIOPSY;  Surgeon: Rolm Bookbinder, MD;  Location: [facility];  Service: General;  Laterality: Right;  . RE-EXCISION OF BREAST LUMPECTOMY Left 05/15/2018   Procedure: RE-EXCISION OF LEFT BREAST LUMPECTOMY, ASPIRATION OF LEFT AXILLARY SEROMA;  Surgeon: Rolm Bookbinder, MD;  Location: [facility];  Service: General;  Laterality: Left;  . T & A    . TOOTH EXTRACTION    . TOTAL KNEE ARTHROPLASTY Left 09/11/2017   Procedure: LEFT TOTAL KNEE ARTHROPLASTY;  Surgeon: Gaynelle Arabian, MD;  Location: [facility];  Service: Orthopedics;  Laterality: Left;  with block  . US ECHOCARDIOGRAPHY  07/17/2006   EF 55-60%   Social History   Occupational History  . 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 Smoking status: Never Smoker  . Smokeless tobacco: Never Used  Vaping Use  . Vaping Use: Never used  Substance and Sexual Activity  . Alcohol use: No  . Drug use: No  . Sexual activity: Not on file

## 2020-07-09 ENCOUNTER — Other Ambulatory Visit: Payer: Self-pay

## 2020-07-09 DIAGNOSIS — M4807 Spinal stenosis, lumbosacral region: Secondary | ICD-10-CM

## 2020-07-15 ENCOUNTER — Telehealth: Payer: Self-pay | Admitting: Orthopaedic Surgery

## 2020-07-15 ENCOUNTER — Other Ambulatory Visit: Payer: Self-pay | Admitting: Orthopaedic Surgery

## 2020-07-15 NOTE — Telephone Encounter (Signed)
Please advise 

## 2020-07-15 NOTE — Telephone Encounter (Signed)
Patient called advised she's finished the prednisone and the sciatica has returned. Patient asked if she can get on a low dose of Prednisone 3 mg until she see Dr Ninfa Linden again? The number to contact patient is [phone]

## 2020-07-15 NOTE — Telephone Encounter (Signed)
Do let her know that I have never prescribed prednisone on a daily basis other than a one-time steroid taper.  I prefer if she checked with her primary care physician about this and about the dosage.  I do the prednisone comes and certainly different dosages and I would not be sure what would the appropriate dose of the because of certain side effects the people can get with prednisone long-term.

## 2020-07-16 NOTE — Telephone Encounter (Signed)
Left message advising

## 2020-07-21 DIAGNOSIS — M5416 Radiculopathy, lumbar region: Secondary | ICD-10-CM | POA: Diagnosis not present

## 2020-07-21 DIAGNOSIS — M5441 Lumbago with sciatica, right side: Secondary | ICD-10-CM | POA: Diagnosis not present

## 2020-07-21 DIAGNOSIS — H40013 Open angle with borderline findings, low risk, bilateral: Secondary | ICD-10-CM | POA: Diagnosis not present

## 2020-07-22 ENCOUNTER — Ambulatory Visit: Payer: Medicare Other | Admitting: Orthopaedic Surgery

## 2020-07-27 DIAGNOSIS — C50412 Malignant neoplasm of upper-outer quadrant of left female breast: Secondary | ICD-10-CM | POA: Diagnosis not present

## 2020-07-30 NOTE — Progress Notes (Signed)
HPI: FU dizziness. Echocardiogram December 2017 showed normal LV systolic function and grade 1 diastolic dysfunction. Orthostatic symptoms at previous office visit. Nuclear study June 2018 showed ejection fraction 64% and no ischemia. Carotid Dopplers July 2018 showed 1-39% left stenosis. Since last seen,there is no dyspnea, chest pain, palpitations or syncope.  Occasional dizziness with standing.   Current Outpatient Medications  Medication Sig Dispense Refill  . acetaminophen (TYLENOL) 500 MG tablet Take 500 mg by mouth every 6 (six) hours as needed for mild pain or headache.    Marland Kitchen azelastine (ASTELIN) 0.1 % nasal spray Place 1 spray into both nostrils 2 (two) times daily. Use in each nostril as directed    . Cholecalciferol (VITAMIN D3) 1000 units CAPS Take 1 capsule by mouth 2 (two) times daily.     Marland Kitchen escitalopram (LEXAPRO) 10 MG tablet Take 15 mg by mouth daily.     Marland Kitchen esomeprazole (NEXIUM) 40 MG capsule Take 40 mg by mouth 2 (two) times daily before a meal.    . ezetimibe (ZETIA) 10 MG tablet Take 10 mg by mouth at bedtime.    . fluticasone (FLONASE) 50 MCG/ACT nasal spray Place 1 spray into both nostrils daily.    Marland Kitchen letrozole (FEMARA) 2.5 MG tablet Take 1 tablet (2.5 mg total) by mouth daily. Start 08/13/2019 90 tablet 4  . levothyroxine (SYNTHROID, LEVOTHROID) 50 MCG tablet Take 50 mcg by mouth at bedtime.    . Magnesium 250 MG TABS Take 1 tablet by mouth daily.     . naproxen sodium (ALEVE) 220 MG tablet Take 220 mg by mouth 2 (two) times daily as needed (pain).    Marland Kitchen OVER THE COUNTER MEDICATION Apply 1 application topically daily as needed (pain). Natural pain relief ointment    . Probiotic Product (PROBIOTIC ADVANCED PO) Take 1 tablet by mouth daily.    . rosuvastatin (CRESTOR) 5 MG tablet Take 5 mg by mouth at bedtime.     . sodium chloride (OCEAN) 0.65 % SOLN nasal spray Place 4 sprays into both nostrils 4 (four) times daily.    . SUPER B COMPLEX/C PO Take 1 tablet by mouth  daily.    Marland Kitchen zolpidem (AMBIEN) 10 MG tablet zolpidem 10 mg tablet  take 1 tablet by mouth at bedtime if needed     No current facility-administered medications for this visit.     Past Medical History:  Diagnosis Date  . Arthritis    hands and knees  . Complication of anesthesia    aspiration pna; following a colonoscopy, pt requests head of bed elevated if possible.  . Constipation   . Cough   . Depression   . Dysrhythmia    RBBB on 06-06-17 ekg   . Elevated liver function tests   . Esophageal stricture   . Family history of breast cancer   . Gallstones   . Genetic testing 04/02/2018   STAT Breast panel with reflex to Multi-Cancer panel (83 genes) @ [facility] - No pathogenic mutations detected  . GERD (gastroesophageal reflux disease)   . Hiatal hernia   . History of kidney stones   . History of radiation therapy 07/09/2018- 08/03/18   Left Breast/ 40.05 Gy in 15 fractions. Left Breast boost 10 Gy in 5 fractions.   . Hyperlipemia   . Hypothyroidism   . Insomnia   . Malignant neoplasm of upper-outer quadrant of left female breast ([place])   . Nephrolithiasis   . Pneumonia 2015   aspirated  after colonosopy  . Renal cyst     Past Surgical History:  Procedure Laterality Date  . BREAST BIOPSY Left 05/31/2019   mri  . BREAST BIOPSY Left 04/2019   malignant  . BREAST LUMPECTOMY Left 04/17/2018   malignant  . BREAST LUMPECTOMY WITH RADIOACTIVE SEED AND SENTINEL LYMPH NODE BIOPSY Left 04/17/2018   Procedure: LEFT BREAST LUMPECTOMY WITH BRACKETED RADIOACTIVE SEEDS AND SENTINEL LYMPH NODE BIOPSY;  Surgeon: Rolm Bookbinder, MD;  Location: [facility];  Service: General;  Laterality: Left;  . BREAST SURGERY Left    Lumpectomy  . CARDIOVASCULAR STRESS TEST  07/19/2006   EF 70%, NO EVIDENCE OF ISCHEMIA  . CHOLECYSTECTOMY  12/30/10  . COLONOSCOPY    . DILATION AND CURETTAGE OF UTERUS    . HYSTEROSCOPY WITH D & C N/A 06/07/2018   Procedure: DILATATION AND CURETTAGE  /HYSTEROSCOPY;  Surgeon: [name], MD;  Location: [facility];  Service: Gynecology;  Laterality: N/A;  . inguinal herniography    . NASAL SINUS SURGERY    . RADIOACTIVE SEED GUIDED EXCISIONAL BREAST BIOPSY Right 04/17/2018   Procedure: RIGHT BREAST SEED GUIDED EXCISIONAL BIOPSY;  Surgeon: Rolm Bookbinder, MD;  Location: [facility];  Service: General;  Laterality: Right;  . RE-EXCISION OF BREAST LUMPECTOMY Left 05/15/2018   Procedure: RE-EXCISION OF LEFT BREAST LUMPECTOMY, ASPIRATION OF LEFT AXILLARY SEROMA;  Surgeon: Rolm Bookbinder, MD;  Location: [facility];  Service: General;  Laterality: Left;  . T & A    . TOOTH EXTRACTION    . TOTAL KNEE ARTHROPLASTY Left 09/11/2017   Procedure: LEFT TOTAL KNEE ARTHROPLASTY;  Surgeon: Gaynelle Arabian, MD;  Location: [facility];  Service: Orthopedics;  Laterality: Left;  with block  . US ECHOCARDIOGRAPHY  07/17/2006   EF 55-60%    Social History   Socioeconomic History  . Marital status: Married    Spouse name: Not on file  . Number of children: 2  . Years of education: Not on file  . Highest education level: Not on file  Occupational History  . 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 Smoking status: Never Smoker  . Smokeless tobacco: Never Used  Vaping Use  . Vaping Use: Never used  Substance and Sexual Activity  . Alcohol use: No  . Drug use: No  . Sexual activity: Not on file  Other Topics Concern  . Not on file  Social History Narrative  . Not on file   Social Determinants of Health   Financial Resource Strain:   . Difficulty of Paying Living Expenses: Not on file  Food Insecurity:   . Worried About Charity fundraiser in the Last Year: Not on file  . Ran Out of Food in the Last Year: Not on file  Transportation Needs:   . Lack of Transportation (Medical): Not on file  . Lack of Transportation (Non-Medical): Not on file  Physical Activity:   . Days of Exercise per Week: Not on  file  . Minutes of Exercise per Session: Not on file  Stress:   . Feeling of Stress : Not on file  Social Connections:   . Frequency of Communication with Friends and Family: Not on file  . Frequency of Social Gatherings with Friends and Family: Not on file  . Attends Religious Services: Not on file  . Active Member of Clubs or Organizations: Not on file  . Attends Archivist Meetings: Not on file  .  Marital Status: Not on file  Intimate Partner Violence:   . Fear of Current or Ex-Partner: Not on file  . Emotionally Abused: Not on file  . Physically Abused: Not on file  . Sexually Abused: Not on file    Family History  Problem Relation Age of Onset  . Breast cancer Mother 53       deceased 7; TAH/BSO in 76s/50s  . Dementia Father        deceased 72  . Breast cancer Maternal Aunt 82       deceased 31s  . Colon cancer Neg Hx   . Stomach cancer Neg Hx     ROS: no fevers or chills, productive cough, hemoptysis, dysphasia, odynophagia, melena, hematochezia, dysuria, hematuria, rash, seizure activity, orthopnea, PND, pedal edema, claudication. Remaining systems are negative.  Physical Exam: Well-developed well-nourished in no acute distress.  Skin is warm and dry.  HEENT is normal.  Neck is supple.  Chest is clear to auscultation with normal expansion.  Cardiovascular exam is regular rate and rhythm.  Abdominal exam nontender or distended. No masses palpated. Extremities show no edema. neuro grossly intact  ECG-sinus rhythm at a rate of 100, incomplete right bundle branch block, inferior lateral infarct.  No change compared to August 14, 2019.  Personally reviewed  A/P  1 orthostasis-patient is doing well from a symptomatic standpoint.  We again discussed the importance of maintaining hydration and increasing sodium intake.  2 hyperlipidemia-managed by primary care.  3 history of chest pain/abnormal ECG-patient has had no recent symptoms.  Previous functional  study negative.  Also note previous electrocardiogram with suggestion of prior anterior and inferior infarct but LV function was normal on echocardiogram and nuclear study showed normal perfusion.  Kirk Ruths, MD

## 2020-08-04 ENCOUNTER — Other Ambulatory Visit: Payer: Self-pay

## 2020-08-04 ENCOUNTER — Encounter: Payer: Self-pay | Admitting: Cardiology

## 2020-08-04 ENCOUNTER — Other Ambulatory Visit: Payer: Self-pay | Admitting: Oncology

## 2020-08-04 ENCOUNTER — Ambulatory Visit (INDEPENDENT_AMBULATORY_CARE_PROVIDER_SITE_OTHER): Payer: Medicare Other | Admitting: Cardiology

## 2020-08-04 VITALS — BP 142/78 | HR 100 | Ht 68.0 in | Wt 157.6 lb

## 2020-08-04 DIAGNOSIS — R0789 Other chest pain: Secondary | ICD-10-CM

## 2020-08-04 DIAGNOSIS — R9431 Abnormal electrocardiogram [ECG] [EKG]: Secondary | ICD-10-CM | POA: Diagnosis not present

## 2020-08-04 DIAGNOSIS — R42 Dizziness and giddiness: Secondary | ICD-10-CM | POA: Diagnosis not present

## 2020-08-04 DIAGNOSIS — E7800 Pure hypercholesterolemia, unspecified: Secondary | ICD-10-CM | POA: Diagnosis not present

## 2020-08-04 NOTE — Patient Instructions (Signed)
Medication Instructions:  NO CHANGE *If you need a refill on your cardiac medications before your next appointment, please call your pharmacy*   Lab Work: If you have labs (blood work) drawn today and your tests are completely normal, you will receive your results only by: . MyChart Message (if you have MyChart) OR . A paper copy in the mail If you have any lab test that is abnormal or we need to change your treatment, we will call you to review the results.   Follow-Up: At [facility], you and your health needs are our priority.  As part of our continuing mission to provide you with exceptional heart care, we have created designated Provider Care Teams.  These Care Teams include your primary Cardiologist (physician) and Advanced Practice Providers (APPs -  Physician Assistants and Nurse Practitioners) who all work together to provide you with the care you need, when you need it.  We recommend signing up for the patient portal called "MyChart".  Sign up information is provided on this After Visit Summary.  MyChart is used to connect with patients for Virtual Visits (Telemedicine).  Patients are able to view lab/test results, encounter notes, upcoming appointments, etc.  Non-urgent messages can be sent to your provider as well.   To learn more about what you can do with MyChart, go to [email or web address].    Your next appointment:   12 month(s)  The format for your next appointment:   In Person  Provider:   You may see BRIAN CRENSHAW MD or one of the following Advanced Practice Providers on your designated Care Team:    Luke Kilroy, PA-C  Callie Goodrich, PA-C  Jesse Cleaver, FNP     

## 2020-08-07 ENCOUNTER — Other Ambulatory Visit: Payer: Self-pay

## 2020-08-07 ENCOUNTER — Ambulatory Visit
Admission: RE | Admit: 2020-08-07 | Discharge: 2020-08-07 | Disposition: A | Discharge status: Home / Self Care | Payer: Medicare Other | Source: Ambulatory Visit | Attending: Orthopaedic Surgery | Admitting: Orthopaedic Surgery

## 2020-08-07 DIAGNOSIS — M48061 Spinal stenosis, lumbar region without neurogenic claudication: Secondary | ICD-10-CM | POA: Diagnosis not present

## 2020-08-07 DIAGNOSIS — M4807 Spinal stenosis, lumbosacral region: Secondary | ICD-10-CM

## 2020-08-10 ENCOUNTER — Ambulatory Visit: Payer: Medicare Other | Admitting: Orthopaedic Surgery

## 2020-08-12 ENCOUNTER — Other Ambulatory Visit: Payer: Self-pay

## 2020-08-12 ENCOUNTER — Encounter: Payer: Self-pay | Admitting: Orthopaedic Surgery

## 2020-08-12 ENCOUNTER — Ambulatory Visit (INDEPENDENT_AMBULATORY_CARE_PROVIDER_SITE_OTHER): Payer: Medicare Other | Admitting: Orthopaedic Surgery

## 2020-08-12 DIAGNOSIS — M5431 Sciatica, right side: Secondary | ICD-10-CM

## 2020-08-12 DIAGNOSIS — M4807 Spinal stenosis, lumbosacral region: Secondary | ICD-10-CM | POA: Diagnosis not present

## 2020-08-12 NOTE — Progress Notes (Signed)
The patient is following up after having an MRI of her lumbar spine.  Also started her on a steroid taper and she said the steroid taper did help her significantly but she is still dealing with significant right-sided low back pain that does radiate down her leg.  She has a history also of the left total knee arthroplasty that may have contributed to some chronic back issues as it relates to her gait.  She is dealt with pain in that knee ever since the surgery.  The components of the knee are stable in terms of the metal components.  I do feel that there is just a slight plate in the knee that may affect her gait.  MRIs reviewed with her and her husband showing them a spine model.  There is significant stenosis at L4-L5 due to a combination of facet joint arthritis, a significant disc bulge as well as grade 1 anterolisthesis at L4 on L5.  This correlates with symptoms going down her right leg.  I would like to send her to Dr. Sherley Bounds of [facility] to assess her lumbar spine.  Her husband has been a patient of Dr. Ronnald Ramp and would also like him to evaluate her and I agree with that plan.  All questions and concerns were answered and addressed.  We will work on making that referral.  If the knee becomes an issue I will see her back at any time for her left knee.

## 2020-08-27 DIAGNOSIS — R030 Elevated blood-pressure reading, without diagnosis of hypertension: Secondary | ICD-10-CM | POA: Diagnosis not present

## 2020-08-27 DIAGNOSIS — M5416 Radiculopathy, lumbar region: Secondary | ICD-10-CM | POA: Diagnosis not present

## 2020-08-31 DIAGNOSIS — L03019 Cellulitis of unspecified finger: Secondary | ICD-10-CM | POA: Diagnosis not present

## 2020-08-31 DIAGNOSIS — L820 Inflamed seborrheic keratosis: Secondary | ICD-10-CM | POA: Diagnosis not present

## 2020-08-31 DIAGNOSIS — B351 Tinea unguium: Secondary | ICD-10-CM | POA: Diagnosis not present

## 2020-09-01 DIAGNOSIS — H16223 Keratoconjunctivitis sicca, not specified as Sjogren's, bilateral: Secondary | ICD-10-CM | POA: Diagnosis not present

## 2020-09-19 DIAGNOSIS — Z23 Encounter for immunization: Secondary | ICD-10-CM | POA: Diagnosis not present

## 2020-09-23 DIAGNOSIS — R7401 Elevation of levels of liver transaminase levels: Secondary | ICD-10-CM | POA: Diagnosis not present

## 2020-09-23 DIAGNOSIS — Z1211 Encounter for screening for malignant neoplasm of colon: Secondary | ICD-10-CM | POA: Diagnosis not present

## 2020-09-23 DIAGNOSIS — K760 Fatty (change of) liver, not elsewhere classified: Secondary | ICD-10-CM | POA: Diagnosis not present

## 2020-09-23 DIAGNOSIS — K219 Gastro-esophageal reflux disease without esophagitis: Secondary | ICD-10-CM | POA: Diagnosis not present

## 2020-09-23 DIAGNOSIS — K449 Diaphragmatic hernia without obstruction or gangrene: Secondary | ICD-10-CM | POA: Diagnosis not present

## 2020-09-23 DIAGNOSIS — K5901 Slow transit constipation: Secondary | ICD-10-CM | POA: Diagnosis not present

## 2020-09-23 DIAGNOSIS — R112 Nausea with vomiting, unspecified: Secondary | ICD-10-CM | POA: Diagnosis not present

## 2020-09-28 ENCOUNTER — Telehealth: Payer: Self-pay

## 2020-09-28 DIAGNOSIS — M48062 Spinal stenosis, lumbar region with neurogenic claudication: Secondary | ICD-10-CM | POA: Diagnosis not present

## 2020-09-28 DIAGNOSIS — M5416 Radiculopathy, lumbar region: Secondary | ICD-10-CM | POA: Diagnosis not present

## 2020-09-28 NOTE — Telephone Encounter (Signed)
Pt called stating she is having "horribe side effects" from letrozole and it is "affecting her quality of life" Hair loss is a big factor. Pt would like to stop medication ASAP. Per Dr Jana Hakim pt may stop Letrozole and f/u with him at her next appt 10/28 to discuss this further. Pt is in agreement and verbalizes thanks.

## 2020-10-02 DIAGNOSIS — H9319 Tinnitus, unspecified ear: Secondary | ICD-10-CM | POA: Diagnosis not present

## 2020-10-02 DIAGNOSIS — M5441 Lumbago with sciatica, right side: Secondary | ICD-10-CM | POA: Diagnosis not present

## 2020-10-02 DIAGNOSIS — E785 Hyperlipidemia, unspecified: Secondary | ICD-10-CM | POA: Diagnosis not present

## 2020-10-02 DIAGNOSIS — R739 Hyperglycemia, unspecified: Secondary | ICD-10-CM | POA: Diagnosis not present

## 2020-10-02 DIAGNOSIS — R202 Paresthesia of skin: Secondary | ICD-10-CM | POA: Diagnosis not present

## 2020-10-02 DIAGNOSIS — K219 Gastro-esophageal reflux disease without esophagitis: Secondary | ICD-10-CM | POA: Diagnosis not present

## 2020-10-02 DIAGNOSIS — R197 Diarrhea, unspecified: Secondary | ICD-10-CM | POA: Diagnosis not present

## 2020-10-02 DIAGNOSIS — M859 Disorder of bone density and structure, unspecified: Secondary | ICD-10-CM | POA: Diagnosis not present

## 2020-10-02 DIAGNOSIS — E039 Hypothyroidism, unspecified: Secondary | ICD-10-CM | POA: Diagnosis not present

## 2020-10-07 NOTE — Progress Notes (Signed)
[facility]  Telephone:([phone] Fax:([phone]) [phone]     ID: [name] DOB: September 15, 1946  MR#: [id number]  TSS#:[id number]  Patient Care Team: Angela Corn, MD as PCP - General Angela Loron, MD as Consulting Physician (General Surgery) [name], Angela Hue, MD as Consulting Physician (Oncology) Angela Peak, MD as Attending Physician (Radiation Oncology) Angela Bunting, MD as Consulting Physician (Cardiology) Angela Overlie, MD as Consulting Physician (Obstetrics and Gynecology) [name], PT as Physical Therapist (Physical Therapy) Angela Ko., MD as Referring Physician (Sports Medicine) OTHER MD:  CHIEF COMPLAINT: Estrogen receptor positive breast cancer  CURRENT TREATMENT: intensified screening   INTERVAL HISTORY: [name] returns today for follow-up of her estrogen receptor positive breast cancer accompanied by her husband [name].  She contacted our office on 09/28/2020 stating she was having "horrible side effects" from the letrozole.  She is having problems with hair thinning, joint pain, decreased energy and increased hot flashes.  She simply cannot tolerate this medication and stopped it last week.   REVIEW OF SYSTEMS: [name] is having all the symptoms as well.  She has lumbar pain.  We obtained an MRI of the spine in August and showed significant degenerative disc disease.  That is not going to be related to her medication.  She has some dizziness and some bad dreams at times and she continues to have cognitive dysfunction which she says is the really worst problem aside from this a detailed review of systems today was stable    HISTORY OF CURRENT ILLNESS: From the original intake note:  "[name]" had routine diagnostic mammography on 03/08/2018 showing breast density category D.  There was a 1.5 cm irregular mass in the left upper outer quadrant posterior depth.  This was further imaged with ultrasonography on the same day.  The  left axilla was sonographically benign.  No abnormalities were seen in the left axilla.  Accordingly on 03/08/2018 she proceeded to biopsy of the left breast area in question. The pathology from this procedure showed ([id number]): Invasive ductal carcinoma, grade I-II. HER-2 negative with the ratio being 1.61 and number per cell 3.30.  The tumor was estrogen receptor 100% positive and progesterone receptor 100% positive, both with strong staining intensity.  Ki67 was <1%.   On 03/18/2018, she underwent a bilateral breast MRI with and without contrast, showing the mass in question in a Charles of 3.3 cm non-masslike enhancement in the posterior aspect of the left upper-outer quadrant. in addition there was a 1.1 cm mass in the left upper-outer quadrant located 2.1 cm anterior and lateral to the biopsy proven malignant.   Right breast showed two indeterminate 5 mm adjacent enhancing nodules in the upper-inner quadrant.   The patient's subsequent history is as detailed below.   PAST MEDICAL HISTORY: Past Medical History:  Diagnosis Date   Arthritis    hands and knees   Complication of anesthesia    aspiration pna; following a colonoscopy, pt requests head of bed elevated if possible.   Constipation    Cough    Depression    Dysrhythmia    RBBB on 06-06-17 ekg    Elevated liver function tests    Esophageal stricture    Family history of breast cancer    Gallstones    Genetic testing 04/02/2018   STAT Breast panel with reflex to Multi-Cancer panel (83 genes) @ [facility] - No pathogenic mutations detected   GERD (gastroesophageal reflux disease)    Hiatal hernia    History  of kidney stones    History of radiation therapy 07/09/2018- 08/03/18   Left Breast/ 40.05 Gy in 15 fractions. Left Breast boost 10 Gy in 5 fractions.    Hyperlipemia    Hypothyroidism    Insomnia    Malignant neoplasm of upper-outer quadrant of left female breast ([place])    Nephrolithiasis    Pneumonia  2015   aspirated after colonosopy   Renal cyst     PAST SURGICAL HISTORY: Past Surgical History:  Procedure Laterality Date   BREAST BIOPSY Left 05/31/2019   mri   BREAST BIOPSY Left 04/2019   malignant   BREAST LUMPECTOMY Left 04/17/2018   malignant   BREAST LUMPECTOMY WITH RADIOACTIVE SEED AND SENTINEL LYMPH NODE BIOPSY Left 04/17/2018   Procedure: LEFT BREAST LUMPECTOMY WITH BRACKETED RADIOACTIVE SEEDS AND SENTINEL LYMPH NODE BIOPSY;  Surgeon: Rolm Bookbinder, MD;  Location: [place];  Service: General;  Laterality: Left;   BREAST SURGERY Left    Lumpectomy   CARDIOVASCULAR STRESS TEST  07/19/2006   EF 70%, NO EVIDENCE OF ISCHEMIA   CHOLECYSTECTOMY  12/30/10   COLONOSCOPY     DILATION AND CURETTAGE OF UTERUS     HYSTEROSCOPY WITH D & C N/A 06/07/2018   Procedure: DILATATION AND CURETTAGE /HYSTEROSCOPY;  Surgeon: [name], MD;  Location: [facility];  Service: Gynecology;  Laterality: N/A;   inguinal herniography     NASAL SINUS SURGERY     RADIOACTIVE SEED GUIDED EXCISIONAL BREAST BIOPSY Right 04/17/2018   Procedure: RIGHT BREAST SEED GUIDED EXCISIONAL BIOPSY;  Surgeon: Rolm Bookbinder, MD;  Location: [place];  Service: General;  Laterality: Right;   RE-EXCISION OF BREAST LUMPECTOMY Left 05/15/2018   Procedure: RE-EXCISION OF LEFT BREAST LUMPECTOMY, ASPIRATION OF LEFT AXILLARY SEROMA;  Surgeon: Rolm Bookbinder, MD;  Location: [place];  Service: General;  Laterality: Left;   T & A     TOOTH EXTRACTION     TOTAL KNEE ARTHROPLASTY Left 09/11/2017   Procedure: LEFT TOTAL KNEE ARTHROPLASTY;  Surgeon: Gaynelle Arabian, MD;  Location: [facility];  Service: Orthopedics;  Laterality: Left;  with block   US ECHOCARDIOGRAPHY  07/17/2006   EF 55-60%    FAMILY HISTORY Family History  Problem Relation Age of Onset   Breast cancer Mother 23       deceased 18; TAH/BSO in 42s/50s   Dementia Father        deceased 80    Breast cancer Maternal Aunt 82       deceased 74s   Colon cancer Neg Hx    Stomach cancer Neg Hx    Her father died of old age at 91 years old. Her mother had breast cancer at 109 and died at 74 years old. She will undergo genetic testing. Her maternal aunt had breast cancer at 68 years old. No family history of ovarian cancer. She had one brother and one sister.    GYNECOLOGIC HISTORY:  No LMP recorded. Patient is postmenopausal. Menarche: 74 years old [place] P 0 LMP: 1997 Contraceptive: 2 years HRT: Estrogen and progesterone for about 7 years  Hysterectomy? No   SOCIAL HISTORY:  She is a retired Futures trader. She has been married to [place], for 45 plus years. He worked in the [employer]. Together they adopted two children. One daughter, [name], 74 of [place], who is a Psychologist, sport and exercise for an OBGYN, and one son, Takeesha Isley, 36 of [place], who owns a Marketing executive business. The patient  has three grandchildren. She attends [employer].    ADVANCED DIRECTIVES: In place   HEALTH MAINTENANCE: Social History   Tobacco Use   Smoking status: Never Smoker   Smokeless tobacco: Never Used  Vaping Use   Vaping Use: Never used  Substance Use Topics   Alcohol use: No   Drug use: No    Colonoscopy: 2014/ [facility]  PAP: 2018  Bone density:  May 2014 at [facility] showed a T score of -0.9 normal   Allergies  Allergen Reactions   Oxycontin [Oxycodone Hcl]     "it made me feel very high and hyper"    Codeine Nausea And Vomiting    Current Outpatient Medications  Medication Sig Dispense Refill   acetaminophen (TYLENOL) 500 MG tablet Take 500 mg by mouth every 6 (six) hours as needed for mild pain or headache.     azelastine (ASTELIN) 0.1 % nasal spray Place 1 spray into both nostrils 2 (two) times daily. Use in each nostril as directed     Cholecalciferol (VITAMIN D3) 1000 units CAPS Take 1 capsule by mouth 2 (two) times daily.       escitalopram (LEXAPRO) 10 MG tablet Take 15 mg by mouth daily.      esomeprazole (NEXIUM) 40 MG capsule Take 40 mg by mouth 2 (two) times daily before a meal.     ezetimibe (ZETIA) 10 MG tablet Take 10 mg by mouth at bedtime.     fluticasone (FLONASE) 50 MCG/ACT nasal spray Place 1 spray into both nostrils daily.     letrozole (FEMARA) 2.5 MG tablet TAKE 1 TABLET(2.5 MG) BY MOUTH DAILY. START 08/13/2019 90 tablet 0   levothyroxine (SYNTHROID, LEVOTHROID) 50 MCG tablet Take 50 mcg by mouth at bedtime.     Magnesium 250 MG TABS Take 1 tablet by mouth daily.      naproxen sodium (ALEVE) 220 MG tablet Take 220 mg by mouth 2 (two) times daily as needed (pain).     OVER THE COUNTER MEDICATION Apply 1 application topically daily as needed (pain). Natural pain relief ointment     Probiotic Product (PROBIOTIC ADVANCED PO) Take 1 tablet by mouth daily.     rosuvastatin (CRESTOR) 5 MG tablet Take 5 mg by mouth at bedtime.      sodium chloride (OCEAN) 0.65 % SOLN nasal spray Place 4 sprays into both nostrils 4 (four) times daily.     SUPER B COMPLEX/C PO Take 1 tablet by mouth daily.     zolpidem (AMBIEN) 10 MG tablet zolpidem 10 mg tablet  take 1 tablet by mouth at bedtime if needed     No current facility-administered medications for this visit.    OBJECTIVE: white woman who appears younger than stated age Vitals:   10/08/20 0845  BP: 116/64  Pulse: 97  Resp: 17  Temp: 97.9 F (36.6 C)  SpO2: 97%     Body mass index is 24.18 kg/m.   Wt Readings from Last 3 Encounters:  10/08/20 159 lb (72.1 kg)  08/04/20 157 lb 9.6 oz (71.5 kg)  06/29/20 156 lb 9.6 oz (71 kg)      ECOG FS:1 - Symptomatic but completely ambulatory  Sclerae unicteric, EOMs intact Wearing a mask No cervical or supraclavicular adenopathy Lungs no rales or rhonchi Heart regular rate and rhythm Abd soft, nontender, positive bowel sounds MSK no focal spinal tenderness, no upper extremity lymphedema Neuro:  nonfocal, well oriented, appropriate affect Breasts:  Status post bilateral lumpectomies and  left sided radiation.  I do not palpate any evidence of recurrent disease.  Both axillae are benign.   LAB RESULTS:  CMP     Component Value Date/Time   NA 141 06/29/2020 1237   NA 144 06/06/2017 1448   K 4.8 06/29/2020 1237   CL 105 06/29/2020 1237   CO2 27 06/29/2020 1237   GLUCOSE 92 06/29/2020 1237   BUN 12 06/29/2020 1237   BUN 12 06/06/2017 1448   CREATININE 0.88 06/29/2020 1237   CREATININE 0.80 11/26/2019 1024   CALCIUM 10.4 (H) 06/29/2020 1237   PROT 7.2 06/29/2020 1237   PROT 6.3 06/06/2017 1448   ALBUMIN 4.3 06/29/2020 1237   ALBUMIN 4.4 06/06/2017 1448   AST 37 06/29/2020 1237   AST 45 (H) 11/26/2019 1024   ALT 26 06/29/2020 1237   ALT 31 11/26/2019 1024   ALKPHOS 87 06/29/2020 1237   BILITOT 0.6 06/29/2020 1237   BILITOT 0.7 11/26/2019 1024   GFRNONAA >60 06/29/2020 1237   GFRNONAA >60 11/26/2019 1024   GFRAA >60 06/29/2020 1237   GFRAA >60 11/26/2019 1024    No results found for: TOTALPROTELP, ALBUMINELP, A1GS, A2GS, BETS, BETA2SER, GAMS, MSPIKE, SPEI  No results found for: KPAFRELGTCHN, LAMBDASER, KAPLAMBRATIO  Lab Results  Component Value Date   WBC 6.7 10/08/2020   NEUTROABS 2.6 10/08/2020   HGB 13.7 10/08/2020   HCT 40.4 10/08/2020   MCV 88.6 10/08/2020   PLT 226 10/08/2020   No results found for: LABCA2  No components found for: [id number]  No results for input(s): INR in the last 168 hours.  No results found for: LABCA2  No results found for: JOI786  No results found for: VEH209  No results found for: OBS962  No results found for: CA2729  No components found for: HGQUANT  No results found for: CEA1 / No results found for: CEA1   No results found for: AFPTUMOR  No results found for: CHROMOGRNA  No results found for: HGBA, HGBA2QUANT, HGBFQUANT, HGBSQUAN (Hemoglobinopathy evaluation)   No results found for: LDH  No results found  for: IRON, TIBC, IRONPCTSAT (Iron and TIBC)  No results found for: FERRITIN  Urinalysis    Component Value Date/Time   COLORURINE YELLOW 08/22/2016 1947   APPEARANCEUR CLEAR 08/22/2016 1947   LABSPEC 1.026 08/22/2016 1947   PHURINE 5.5 08/22/2016 1947   GLUCOSEU NEGATIVE 08/22/2016 1947   HGBUR SMALL (A) 08/22/2016 1947   BILIRUBINUR NEGATIVE 08/22/2016 [place] 08/22/2016 1947   PROTEINUR NEGATIVE 08/22/2016 1947   UROBILINOGEN 0.2 08/22/2016 1622   NITRITE NEGATIVE 08/22/2016 1947   LEUKOCYTESUR SMALL (A) 08/22/2016 1947    STUDIES: No results found.   ASSESSMENT: 74 y.o. [place] woman status post left breast upper outer quadrant biopsy 03/08/2018 for a clinical T1c-T2 N0, stage I invasive ductal carcinoma, grade 1 or 2, estrogen and progesterone receptor positive, HER-2 not amplified, with an MIB-1 of less than 1%  (1) additional biopsies as follows:  (a) right breast biopsy ([id number]) on 04/02/2018 showed atypical lobular hyperplasia  (b) left breast ([id number]) on 03/22/2018 showed invasive lobular carcinoma, grade 2, estrogen and progesterone receptor positive with an MIB-1 of 5%. HER-2 not amplified  (2) bilateral lumpectomies and left sentinel lymph node sampling 04/17/2018 found  (a) on the right side, atypical lobular hyperplasia  (b) on the left side, an mpT1c pN0, stage IA, grade 2 invasive lobular carcinoma, with positive margins  (c) additional left breast surgery on 05/15/2018 cleared the compromise  margins  (3) The Oncotype DX score was 14, predicting a risk of outside the breast recurrence over the next 9 years of 4% if the patient's only systemic therapy is tamoxifen for 5 years.  It also predicts no significant benefit from chemotherapy.  (4) adjuvant radiation 07/09/2018 - 08/03/2018 Site/dose:    1. Left Breast / 40.05 Gy in 15 fractions 2. Left Breast Boost / 10 Gy in 5 fractions  (5) started tamoxifen 09/11/2018,  discontinued June 2020 with cognitive issues   (6) genetics testing 03/22/2018 through [facility]'s STAT Breast panel with reflex to Multi-Cancer panel found no pathogenic mutations in (ALK, APC, ATM, AXIN2, BAP1, BARD1, BLM, BMPR1A, BRCA1, BRCA2, BRIP1, CASR, CDC73, CDH1, CDK4, CDKN1B, CDKN1C, CDKN2A, CEBPA, CHEK2, CTNNA1, DICER1, DIS3L2, EGFR, EPCAM, FH, FLCN, GATA2, GPC3, GREM1, HOXB13, HRAS, KIT, MAX, MEN1, MET, MITF, MLH1, MSH2, MSH3, MSH6, MUTYH, NBN, NF1, NF2, NTHL1, PALB2, PDGFRA, PHOX2B, PMS2, POLD1, POLE, POT1, PRKAR1A, PTCH1, PTEN, RAD50, RAD51C, RAD51D, RB1, RECQL4, RET, RUNX1, SDHA, SDHAF2, SDHB, SDHC, SDHD, SMAD4, SMARCA4, SMARCB1, SMARCE1, STK11, SUFU, TERC, TERT, TMEM127, TP53, TSC1, TSC2, VHL, WRN, WT1).  (a) Variants of Uncertain Significance were found in in PDGFRA and RECQL4.  (7) started letrozole 09/26/2019, discontinued 11/26/2019 with cognitive issues  (a) bone density May 2014 normal, T score -0.9  (b) letrozole resumed 2020-01-22, taken Mondays, Wednesday, Friday, as being off medication made no difference to either hot flashes or cognitive deficiencies  (c) letrozole discontinued October 2021 with multiple side effects   PLAN: [name] has had a hard time with antiestrogens so far.  We have struck out on tamoxifen and letrozole.  I think she needs a "washout".  Particularly now that the holidays are coming up so she is not going to be starting antiestrogens until she returns to see me in January.  At that time we will discuss exemestane versus fulvestrant.  She understands that she is making just as much estrogen as her husband tonight and that this is quite enough to feed a cancer.  She knows that I am not worried about this cancer coming back this week or next.  Lobular's are very slow but they tend to come back late and that is what we are trying to prevent.  She was supposed to have had her mammogram before today's visit but there was some confusion.  I have placed the order  and also a bone density order and hopefully those tests came got done within the next week or so.  If she cannot take antiestrogens we can consider intensified screening except she had a horrible time with the biopsy following the MRI in April.  She is not really sure she wants any more false positives.  We can also discuss that when she returns to see me in January.  Total encounter time 35 minutes.    Muaad Boehning, Virgie Dad, MD  10/08/20 8:47 AM Medical Oncology and Hematology [facility] [place], [place] [address] Tel. [phone]    Fax. [phone]   I, Wilburn Mylar, am acting as scribe for Dr. Virgie Dad. Jessika Rothery.  I, Lurline Del MD, have reviewed the above documentation for accuracy and completeness, and I agree with the above.   *Total Encounter Time as defined by the Centers for Medicare and Medicaid Services includes, in addition to the face-to-face time of a patient visit (documented in the note above) non-face-to-face time: obtaining and reviewing outside history, ordering and reviewing medications, tests or procedures, care coordination (communications with  other health care professionals or caregivers) and documentation in the medical record.

## 2020-10-08 ENCOUNTER — Other Ambulatory Visit: Payer: Self-pay

## 2020-10-08 ENCOUNTER — Inpatient Hospital Stay: Payer: Medicare Other

## 2020-10-08 ENCOUNTER — Inpatient Hospital Stay: Payer: Medicare Other | Attending: Oncology | Admitting: Oncology

## 2020-10-08 VITALS — BP 116/64 | HR 97 | Temp 97.9°F | Resp 17 | Ht 68.0 in | Wt 159.0 lb

## 2020-10-08 DIAGNOSIS — Z170 Estrogen receptor positive status [ER+]: Secondary | ICD-10-CM | POA: Insufficient documentation

## 2020-10-08 DIAGNOSIS — Z79899 Other long term (current) drug therapy: Secondary | ICD-10-CM | POA: Insufficient documentation

## 2020-10-08 DIAGNOSIS — C50412 Malignant neoplasm of upper-outer quadrant of left female breast: Secondary | ICD-10-CM

## 2020-10-08 DIAGNOSIS — Z79811 Long term (current) use of aromatase inhibitors: Secondary | ICD-10-CM | POA: Insufficient documentation

## 2020-10-08 LAB — CBC WITH DIFFERENTIAL/PLATELET
Abs Immature Granulocytes: 0.02 10*3/uL (ref 0.00–0.07)
Basophils Absolute: 0.1 10*3/uL (ref 0.0–0.1)
Basophils Relative: 1 %
Eosinophils Absolute: 0.6 10*3/uL — ABNORMAL HIGH (ref 0.0–0.5)
Eosinophils Relative: 8 %
HCT: 40.4 % (ref 36.0–46.0)
Hemoglobin: 13.7 g/dL (ref 12.0–15.0)
Immature Granulocytes: 0 %
Lymphocytes Relative: 41 %
Lymphs Abs: 2.7 10*3/uL (ref 0.7–4.0)
MCH: 30 pg (ref 26.0–34.0)
MCHC: 33.9 g/dL (ref 30.0–36.0)
MCV: 88.6 fL (ref 80.0–100.0)
Monocytes Absolute: 0.7 10*3/uL (ref 0.1–1.0)
Monocytes Relative: 11 %
Neutro Abs: 2.6 10*3/uL (ref 1.7–7.7)
Neutrophils Relative %: 39 %
Platelets: 226 10*3/uL (ref 150–400)
RBC: 4.56 MIL/uL (ref 3.87–5.11)
RDW: 13.1 % (ref 11.5–15.5)
WBC: 6.7 10*3/uL (ref 4.0–10.5)
nRBC: 0 % (ref 0.0–0.2)

## 2020-10-08 LAB — COMPREHENSIVE METABOLIC PANEL
ALT: 40 U/L (ref 0–44)
AST: 47 U/L — ABNORMAL HIGH (ref 15–41)
Albumin: 4.2 g/dL (ref 3.5–5.0)
Alkaline Phosphatase: 86 U/L (ref 38–126)
Anion gap: 7 (ref 5–15)
BUN: 11 mg/dL (ref 8–23)
CO2: 27 mmol/L (ref 22–32)
Calcium: 9.9 mg/dL (ref 8.9–10.3)
Chloride: 108 mmol/L (ref 98–111)
Creatinine, Ser: 0.79 mg/dL (ref 0.44–1.00)
GFR, Estimated: >60 mL/min (ref >60)
Glucose, Bld: 114 mg/dL — ABNORMAL HIGH (ref 70–99)
Potassium: 4.2 mmol/L (ref 3.5–5.1)
Sodium: 142 mmol/L (ref 135–145)
Total Bilirubin: 0.7 mg/dL (ref 0.3–1.2)
Total Protein: 6.8 g/dL (ref 6.5–8.1)

## 2020-10-09 DIAGNOSIS — E785 Hyperlipidemia, unspecified: Secondary | ICD-10-CM | POA: Diagnosis not present

## 2020-10-09 DIAGNOSIS — R82998 Other abnormal findings in urine: Secondary | ICD-10-CM | POA: Diagnosis not present

## 2020-10-09 DIAGNOSIS — I6529 Occlusion and stenosis of unspecified carotid artery: Secondary | ICD-10-CM | POA: Diagnosis not present

## 2020-10-09 DIAGNOSIS — F329 Major depressive disorder, single episode, unspecified: Secondary | ICD-10-CM | POA: Diagnosis not present

## 2020-10-09 DIAGNOSIS — I700 Atherosclerosis of aorta: Secondary | ICD-10-CM | POA: Diagnosis not present

## 2020-10-09 DIAGNOSIS — R945 Abnormal results of liver function studies: Secondary | ICD-10-CM | POA: Diagnosis not present

## 2020-10-09 DIAGNOSIS — M1990 Unspecified osteoarthritis, unspecified site: Secondary | ICD-10-CM | POA: Diagnosis not present

## 2020-10-09 DIAGNOSIS — Z0000 Encounter for general adult medical examination without abnormal findings: Secondary | ICD-10-CM | POA: Diagnosis not present

## 2020-10-09 DIAGNOSIS — K760 Fatty (change of) liver, not elsewhere classified: Secondary | ICD-10-CM | POA: Diagnosis not present

## 2020-10-09 DIAGNOSIS — M8580 Other specified disorders of bone density and structure, unspecified site: Secondary | ICD-10-CM | POA: Diagnosis not present

## 2020-10-09 DIAGNOSIS — E039 Hypothyroidism, unspecified: Secondary | ICD-10-CM | POA: Diagnosis not present

## 2020-10-09 DIAGNOSIS — C50412 Malignant neoplasm of upper-outer quadrant of left female breast: Secondary | ICD-10-CM | POA: Diagnosis not present

## 2020-10-09 DIAGNOSIS — K449 Diaphragmatic hernia without obstruction or gangrene: Secondary | ICD-10-CM | POA: Diagnosis not present

## 2020-10-14 ENCOUNTER — Other Ambulatory Visit: Payer: Self-pay | Admitting: Internal Medicine

## 2020-10-14 DIAGNOSIS — I6529 Occlusion and stenosis of unspecified carotid artery: Secondary | ICD-10-CM

## 2020-10-18 DIAGNOSIS — Z23 Encounter for immunization: Secondary | ICD-10-CM | POA: Diagnosis not present

## 2020-10-21 DIAGNOSIS — M48062 Spinal stenosis, lumbar region with neurogenic claudication: Secondary | ICD-10-CM | POA: Diagnosis not present

## 2020-10-21 DIAGNOSIS — M5416 Radiculopathy, lumbar region: Secondary | ICD-10-CM | POA: Diagnosis not present

## 2020-10-21 DIAGNOSIS — R030 Elevated blood-pressure reading, without diagnosis of hypertension: Secondary | ICD-10-CM | POA: Diagnosis not present

## 2020-10-22 ENCOUNTER — Ambulatory Visit
Admission: RE | Admit: 2020-10-22 | Discharge: 2020-10-22 | Disposition: A | Discharge status: Home / Self Care | Payer: Medicare Other | Source: Ambulatory Visit | Attending: Internal Medicine | Admitting: Internal Medicine

## 2020-10-22 DIAGNOSIS — I6529 Occlusion and stenosis of unspecified carotid artery: Secondary | ICD-10-CM

## 2020-10-22 DIAGNOSIS — I6523 Occlusion and stenosis of bilateral carotid arteries: Secondary | ICD-10-CM | POA: Diagnosis not present

## 2020-11-17 DIAGNOSIS — Z1212 Encounter for screening for malignant neoplasm of rectum: Secondary | ICD-10-CM | POA: Diagnosis not present

## 2020-12-02 ENCOUNTER — Telehealth: Payer: Self-pay | Admitting: *Deleted

## 2020-12-02 NOTE — Telephone Encounter (Signed)
Message left by " [name] " stating she wanted to give her permission to this office to discuss her case and diagnosis with her sister Marcelina Morel who has recently been diagnosed with breast cancer and is seeing Dr Jana Hakim on 12/17/2019.  " if you need to mail me a release let me know "  She stated concern due to her having breast cancer, their " mother had breast cancer and died from it" as well as an aunt " and now my sister ".  Return call number given if needed as [phone].

## 2020-12-15 ENCOUNTER — Other Ambulatory Visit: Payer: Self-pay | Admitting: Oncology

## 2020-12-22 DIAGNOSIS — L609 Nail disorder, unspecified: Secondary | ICD-10-CM | POA: Diagnosis not present

## 2020-12-22 DIAGNOSIS — D485 Neoplasm of uncertain behavior of skin: Secondary | ICD-10-CM | POA: Diagnosis not present

## 2020-12-22 DIAGNOSIS — L820 Inflamed seborrheic keratosis: Secondary | ICD-10-CM | POA: Diagnosis not present

## 2020-12-22 DIAGNOSIS — L608 Other nail disorders: Secondary | ICD-10-CM | POA: Diagnosis not present

## 2020-12-23 NOTE — Progress Notes (Signed)
[facility]  [phone]) [phone] Fax:(336) [phone]     ID: [name] DOB: 1946-04-16  MR#: [id number]  FHL#:[id number]  Patient Care Team: Shon Baton, MD as PCP - General Rolm Bookbinder, MD as Consulting Physician (General Surgery) Cariah Salatino, Virgie Dad, MD as Consulting Physician (Oncology) Eppie Gibson, MD as Attending Physician (Radiation Oncology) Lelon Perla, MD as Consulting Physician (Cardiology) [name], MD as Consulting Physician (Obstetrics and Gynecology) Jacquelynn Cree, PT as Physical Therapist (Physical Therapy) Donzetta Sprung., MD as Referring Physician (Sports Medicine) OTHER MD:  CHIEF COMPLAINT: Estrogen receptor positive breast cancer  CURRENT TREATMENT: Exemestane   INTERVAL HISTORY: [name] returns today for follow-up of her estrogen receptor positive breast cancer accompanied by her husband [name].  She has been on observation since 09/2020 after being unable to tolerate letrozole she finds that her thinking is a little bit clearer.  She still has hot flashes but they are not as bad.  She had been losing her hair, but her hair is now growing normally.  She is scheduled for annual diagnostic mammography on 01/01/2021 and for bone density screening on 02/26/2021.  Of note, since her last visit, she underwent carotid ultrasound on 10/22/2020, which was normal.  She also had an MRI of the lumbar spine 08/07/2020 which showed significant degenerative disc disease but no evidence of cancer   REVIEW OF SYSTEMS: [name] enjoyed the holidays, which she spent with family.  Her sister of course has been diagnosed with breast cancer and is planning on bilateral mastectomies.  This is a source of concern to her.  She takes walks occasionally but not regularly.  A detailed review of systems today was otherwise stable   COVID 19 VACCINATION STATUS: Status post Moderna x2, most recently February 2021, with booster October 2022     HISTORY OF CURRENT ILLNESS: From the original intake note:  "[name]" had routine diagnostic mammography on 03/08/2018 showing breast density category D.  There was a 1.5 cm irregular mass in the left upper outer quadrant posterior depth.  This was further imaged with ultrasonography on the same day.  The left axilla was sonographically benign.  No abnormalities were seen in the left axilla.  Accordingly on 03/08/2018 she proceeded to biopsy of the left breast area in question. The pathology from this procedure showed ([id number]): Invasive ductal carcinoma, grade I-II. HER-2 negative with the ratio being 1.61 and number per cell 3.30.  The tumor was estrogen receptor 100% positive and progesterone receptor 100% positive, both with strong staining intensity.  Ki67 was <1%.   On 03/18/2018, she underwent a bilateral breast MRI with and without contrast, showing the mass in question in a setting of 3.3 cm non-masslike enhancement in the posterior aspect of the left upper-outer quadrant. in addition there was a 1.1 cm mass in the left upper-outer quadrant located 2.1 cm anterior and lateral to the biopsy proven malignant.   Right breast showed two indeterminate 5 mm adjacent enhancing nodules in the upper-inner quadrant.   The patient's subsequent history is as detailed below.   PAST MEDICAL HISTORY: Past Medical History:  Diagnosis Date  . Arthritis    hands and knees  . Complication of anesthesia    aspiration pna; following a colonoscopy, pt requests head of bed elevated if possible.  . Constipation   . Cough   . Depression   . Dysrhythmia    RBBB on 06-06-17 ekg   . Elevated liver function tests   . Esophageal  stricture   . Family history of breast cancer   . Gallstones   . Genetic testing 04/02/2018   STAT Breast panel with reflex to Multi-Cancer panel (83 genes) @ [facility] - No pathogenic mutations detected  . GERD (gastroesophageal reflux disease)   . Hiatal hernia   . History of  kidney stones   . History of radiation therapy 07/09/2018- 08/03/18   Left Breast/ 40.05 Gy in 15 fractions. Left Breast boost 10 Gy in 5 fractions.   . Hyperlipemia   . Hypothyroidism   . Insomnia   . Malignant neoplasm of upper-outer quadrant of left female breast ([facility])   . Nephrolithiasis   . Pneumonia 2015   aspirated after colonosopy  . Renal cyst     PAST SURGICAL HISTORY: Past Surgical History:  Procedure Laterality Date  . BREAST BIOPSY Left 05/31/2019   mri  . BREAST BIOPSY Left 04/2019   malignant  . BREAST LUMPECTOMY Left 04/17/2018   malignant  . BREAST LUMPECTOMY WITH RADIOACTIVE SEED AND SENTINEL LYMPH NODE BIOPSY Left 04/17/2018   Procedure: LEFT BREAST LUMPECTOMY WITH BRACKETED RADIOACTIVE SEEDS AND SENTINEL LYMPH NODE BIOPSY;  Surgeon: Rolm Bookbinder, MD;  Location: [facility];  Service: General;  Laterality: Left;  . BREAST SURGERY Left    Lumpectomy  . CARDIOVASCULAR STRESS TEST  07/19/2006   EF 70%, NO EVIDENCE OF ISCHEMIA  . CHOLECYSTECTOMY  12/30/10  . COLONOSCOPY    . DILATION AND CURETTAGE OF UTERUS    . HYSTEROSCOPY WITH D & C N/A 06/07/2018   Procedure: DILATATION AND CURETTAGE /HYSTEROSCOPY;  Surgeon: [name], MD;  Location: [facility];  Service: Gynecology;  Laterality: N/A;  . inguinal herniography    . NASAL SINUS SURGERY    . RADIOACTIVE SEED GUIDED EXCISIONAL BREAST BIOPSY Right 04/17/2018   Procedure: RIGHT BREAST SEED GUIDED EXCISIONAL BIOPSY;  Surgeon: Rolm Bookbinder, MD;  Location: [facility];  Service: General;  Laterality: Right;  . RE-EXCISION OF BREAST LUMPECTOMY Left 05/15/2018   Procedure: RE-EXCISION OF LEFT BREAST LUMPECTOMY, ASPIRATION OF LEFT AXILLARY SEROMA;  Surgeon: Rolm Bookbinder, MD;  Location: [facility];  Service: General;  Laterality: Left;  . T & A    . TOOTH EXTRACTION    . TOTAL KNEE ARTHROPLASTY Left 09/11/2017   Procedure: LEFT TOTAL KNEE ARTHROPLASTY;  Surgeon:  Gaynelle Arabian, MD;  Location: [facility];  Service: Orthopedics;  Laterality: Left;  with block  . US ECHOCARDIOGRAPHY  07/17/2006   EF 55-60%    FAMILY HISTORY Family History  Problem Relation Age of Onset  . Breast cancer Mother 66       deceased 46; TAH/BSO in 25s/50s  . Dementia Father        deceased 9  . Breast cancer Maternal Aunt 82       deceased 51s  . Colon cancer Neg Hx   . Stomach cancer Neg Hx    Her father died of old age at 76 years old. Her mother had breast cancer at 59 and died at 75 years old. She will undergo genetic testing. Her maternal aunt had breast cancer at 80 years old. No family history of ovarian cancer. She had one brother and one sister.    GYNECOLOGIC HISTORY:  No LMP recorded. Patient is postmenopausal. Menarche: 75 years old Waves P 0 LMP: 1997 Contraceptive: 2 years HRT: Estrogen and progesterone for about 7 years  Hysterectomy? No   SOCIAL HISTORY:  She is a retired Programmer, systems  designer. She has been married to [place], for 45 plus years. He worked in the [employer]. Together they adopted two children. One daughter, [name], 68 of [place], who is a Psychologist, sport and exercise for an OBGYN, and one son, Starlene Consuegra, 36 of [place], who owns a Marketing executive business. The patient has three grandchildren. She attends [employer].    ADVANCED DIRECTIVES: In place   HEALTH MAINTENANCE: Social History   Tobacco Use  . Smoking status: Never Smoker  . Smokeless tobacco: Never Used  Vaping Use  . Vaping Use: Never used  Substance Use Topics  . Alcohol use: No  . Drug use: No    Colonoscopy: 2014/ [facility]  PAP: 2018  Bone density:  May 2014 at [facility] showed a T score of -0.9 normal   Allergies  Allergen Reactions  . Oxycontin [Oxycodone Hcl]     "it made me feel very high and hyper"   . Codeine Nausea And Vomiting    Current Outpatient Medications  Medication Sig Dispense Refill  . exemestane (AROMASIN) 25 MG tablet  Take 1 tablet (25 mg total) by mouth daily after breakfast. 90 tablet 4  . acetaminophen (TYLENOL) 500 MG tablet Take 500 mg by mouth every 6 (six) hours as needed for mild pain or headache.    Marland Kitchen azelastine (ASTELIN) 0.1 % nasal spray Place 1 spray into both nostrils 2 (two) times daily. Use in each nostril as directed    . Cholecalciferol (VITAMIN D3) 1000 units CAPS Take 1 capsule by mouth 2 (two) times daily.     Marland Kitchen escitalopram (LEXAPRO) 10 MG tablet Take 15 mg by mouth daily.     Marland Kitchen esomeprazole (NEXIUM) 40 MG capsule Take 40 mg by mouth 2 (two) times daily before a meal.    . ezetimibe (ZETIA) 10 MG tablet Take 10 mg by mouth at bedtime.    . fluticasone (FLONASE) 50 MCG/ACT nasal spray Place 1 spray into both nostrils daily.    Marland Kitchen levothyroxine (SYNTHROID, LEVOTHROID) 50 MCG tablet Take 50 mcg by mouth at bedtime.    . Magnesium 250 MG TABS Take 1 tablet by mouth daily.     . naproxen sodium (ALEVE) 220 MG tablet Take 220 mg by mouth 2 (two) times daily as needed (pain).    Marland Kitchen OVER THE COUNTER MEDICATION Apply 1 application topically daily as needed (pain). Natural pain relief ointment    . Probiotic Product (PROBIOTIC ADVANCED PO) Take 1 tablet by mouth daily.    . rosuvastatin (CRESTOR) 5 MG tablet Take 5 mg by mouth at bedtime.     . sodium chloride (OCEAN) 0.65 % SOLN nasal spray Place 4 sprays into both nostrils 4 (four) times daily.    . SUPER B COMPLEX/C PO Take 1 tablet by mouth daily.    Marland Kitchen zolpidem (AMBIEN) 10 MG tablet zolpidem 10 mg tablet  take 1 tablet by mouth at bedtime if needed     No current facility-administered medications for this visit.    OBJECTIVE: white woman who appears younger than stated age 45:   12/24/20 0854  BP: (!) 120/56  Pulse: 95  Resp: 16  Temp: 97.7 F (36.5 C)  SpO2: 98%     Body mass index is 25.12 kg/m.   Wt Readings from Last 3 Encounters:  12/24/20 165 lb 3.2 oz (74.9 kg)  10/08/20 159 lb (72.1 kg)  08/04/20 157 lb 9.6 oz (71.5 kg)       ECOG FS:1 -  Symptomatic but completely ambulatory  Sclerae unicteric, EOMs intact Wearing a mask No cervical or supraclavicular adenopathy Lungs no rales or rhonchi Heart regular rate and rhythm Abd soft, nontender, positive bowel sounds MSK no focal spinal tenderness, no upper extremity lymphedema Neuro: nonfocal, well oriented, appropriate affect Breasts: Status post bilateral lumpectomy and status post left-sided radiation.  There is no evidence of local recurrence.  Both axillae are benign   LAB RESULTS:  CMP     Component Value Date/Time   NA 141 12/24/2020 0840   NA 144 06/06/2017 1448   K 4.5 12/24/2020 0840   CL 107 12/24/2020 0840   CO2 27 12/24/2020 0840   GLUCOSE 95 12/24/2020 0840   BUN 11 12/24/2020 0840   BUN 12 06/06/2017 1448   CREATININE 0.93 12/24/2020 0840   CREATININE 0.80 11/26/2019 1024   CALCIUM 10.0 12/24/2020 0840   PROT 6.9 12/24/2020 0840   PROT 6.3 06/06/2017 1448   ALBUMIN 4.1 12/24/2020 0840   ALBUMIN 4.4 06/06/2017 1448   AST 20 12/24/2020 0840   AST 45 (H) 11/26/2019 1024   ALT 18 12/24/2020 0840   ALT 31 11/26/2019 1024   ALKPHOS 76 12/24/2020 0840   BILITOT 0.7 12/24/2020 0840   BILITOT 0.7 11/26/2019 1024   GFRNONAA >60 12/24/2020 0840   GFRNONAA >60 11/26/2019 1024   GFRAA >60 06/29/2020 1237   GFRAA >60 11/26/2019 1024    No results found for: TOTALPROTELP, ALBUMINELP, A1GS, A2GS, BETS, BETA2SER, GAMS, MSPIKE, SPEI  No results found for: KPAFRELGTCHN, LAMBDASER, KAPLAMBRATIO  Lab Results  Component Value Date   WBC 8.3 12/24/2020   NEUTROABS 3.6 12/24/2020   HGB 13.9 12/24/2020   HCT 43.4 12/24/2020   MCV 91.0 12/24/2020   PLT 254 12/24/2020   No results found for: LABCA2  No components found for: IEPPIR518  No results for input(s): INR in the last 168 hours.  No results found for: LABCA2  No results found for: ACZ660  No results found for: YTK160  No results found for: FUX323  No results found for:  CA2729  No components found for: HGQUANT  No results found for: CEA1 / No results found for: CEA1   No results found for: AFPTUMOR  No results found for: CHROMOGRNA  No results found for: HGBA, HGBA2QUANT, HGBFQUANT, HGBSQUAN (Hemoglobinopathy evaluation)   No results found for: LDH  No results found for: IRON, TIBC, IRONPCTSAT (Iron and TIBC)  No results found for: FERRITIN  Urinalysis    Component Value Date/Time   COLORURINE YELLOW 08/22/2016 1947   APPEARANCEUR CLEAR 08/22/2016 1947   LABSPEC 1.026 08/22/2016 1947   PHURINE 5.5 08/22/2016 1947   GLUCOSEU NEGATIVE 08/22/2016 1947   HGBUR SMALL (A) 08/22/2016 1947   BILIRUBINUR NEGATIVE 08/22/2016 [place] 08/22/2016 1947   PROTEINUR NEGATIVE 08/22/2016 1947   UROBILINOGEN 0.2 08/22/2016 1622   NITRITE NEGATIVE 08/22/2016 1947   LEUKOCYTESUR SMALL (A) 08/22/2016 1947    STUDIES: No results found.   ASSESSMENT: 75 y.o. [place] woman status post left breast upper outer quadrant biopsy 03/08/2018 for a clinical T1c-T2 N0, stage I invasive ductal carcinoma, grade 1 or 2, estrogen and progesterone receptor positive, HER-2 not amplified, with an MIB-1 of less than 1%  (1) additional biopsies as follows:  (a) right breast biopsy ([id number]) on 04/02/2018 showed atypical lobular hyperplasia  (b) left breast ([id number]) on 03/22/2018 showed invasive lobular carcinoma, grade 2, estrogen and progesterone receptor positive with an MIB-1 of 5%. HER-2 not  amplified  (2) bilateral lumpectomies and left sentinel lymph node sampling 04/17/2018 found  (a) on the right side, atypical lobular hyperplasia  (b) on the left side, an mpT1c pN0, stage IA, grade 2 invasive lobular carcinoma, with positive margins  (c) additional left breast surgery on 05/15/2018 cleared the compromise margins  (3) The Oncotype DX score was 14, predicting a risk of outside the breast recurrence over the next 9 years of 4% if the  patient's only systemic therapy is tamoxifen for 5 years.  It also predicts no significant benefit from chemotherapy.  (4) adjuvant radiation 07/09/2018 - 08/03/2018 Site/dose:    1. Left Breast / 40.05 Gy in 15 fractions 2. Left Breast Boost / 10 Gy in 5 fractions  (5) started tamoxifen 09/11/2018, discontinued June 2020 with cognitive issues   (6) genetics testing 03/22/2018 through [facility]'s STAT Breast panel with reflex to Multi-Cancer panel found no pathogenic mutations in (ALK, APC, ATM, AXIN2, BAP1, BARD1, BLM, BMPR1A, BRCA1, BRCA2, BRIP1, CASR, CDC73, CDH1, CDK4, CDKN1B, CDKN1C, CDKN2A, CEBPA, CHEK2, CTNNA1, DICER1, DIS3L2, EGFR, EPCAM, FH, FLCN, GATA2, GPC3, GREM1, HOXB13, HRAS, KIT, MAX, MEN1, MET, MITF, MLH1, MSH2, MSH3, MSH6, MUTYH, NBN, NF1, NF2, NTHL1, PALB2, PDGFRA, PHOX2B, PMS2, POLD1, POLE, POT1, PRKAR1A, PTCH1, PTEN, RAD50, RAD51C, RAD51D, RB1, RECQL4, RET, RUNX1, SDHA, SDHAF2, SDHB, SDHC, SDHD, SMAD4, SMARCA4, SMARCB1, SMARCE1, STK11, SUFU, TERC, TERT, TMEM127, TP53, TSC1, TSC2, VHL, WRN, WT1).  (a) Variants of Uncertain Significance were found in in PDGFRA and RECQL4.  (7) started letrozole 09/26/2019, discontinued 11/26/2019 with cognitive issues  (a) bone density May 2014 normal, T score -0.9  (b) letrozole resumed 2020-01-22, taken Mondays, Wednesday, Friday, as being off medication made no difference to either hot flashes or cognitive deficiencies  (c) letrozole discontinued October 2021 with multiple side effects  (8) exemestane prescribed 12/24/2020   PLAN: We reviewed [name] situation in detail.  I offered her intensified screening if she is unable to tolerate antiestrogens.  This would find a new breast cancer or a recurrent breast cancer in the breast very early but expose her to false positives.  Of course intensified screening would not treat occult breast cancer which might be present from her original disease in her liver or lungs or bones.  For that reason  she is interested in giving antiestrogens a third try.  She was miserable with tamoxifen a little less miserable with letrozole.  Some of her friends are taking anastrozole and she wondered if she should take that.  We reviewed the current antiestrogen strategies including the use of fulvestrant, other fulvestrant like drugs which are coming down the pike as pills, and exemestane.  After much discussion we decided to give exemestane a try and I put the prescription in for her.  I will see her virtually in March to discuss side effects.  If she cannot tolerate exemestane we have the option of fulvestrant or intensified screening.  If she does tolerated exemestane then we will try to get that to continue for a total of 5 years  Total encounter time 35 minutes.*   Bensen Chadderdon, Valentino Hue, MD  12/24/20 9:44 AM Medical Oncology and Hematology [facility] [address] [place], [place] [address] Tel. [phone]    Fax. [phone]   I, Mickie Bail, am acting as scribe for Dr. Valentino Hue. Vivek Grealish.  I, Ruthann Cancer MD, have reviewed the above documentation for accuracy and completeness, and I agree with the above.   *Total Encounter Time as defined by  the Centers for Medicare and Medicaid Services includes, in addition to the face-to-face time of a patient visit (documented in the note above) non-face-to-face time: obtaining and reviewing outside history, ordering and reviewing medications, tests or procedures, care coordination (communications with other health care professionals or caregivers) and documentation in the medical record.

## 2020-12-24 ENCOUNTER — Inpatient Hospital Stay: Payer: Medicare Other | Attending: Oncology | Admitting: Oncology

## 2020-12-24 ENCOUNTER — Inpatient Hospital Stay: Payer: Medicare Other

## 2020-12-24 ENCOUNTER — Other Ambulatory Visit: Payer: Self-pay

## 2020-12-24 VITALS — BP 120/56 | HR 95 | Temp 97.7°F | Resp 16 | Ht 68.0 in | Wt 165.2 lb

## 2020-12-24 DIAGNOSIS — C50412 Malignant neoplasm of upper-outer quadrant of left female breast: Secondary | ICD-10-CM | POA: Diagnosis not present

## 2020-12-24 DIAGNOSIS — Z170 Estrogen receptor positive status [ER+]: Secondary | ICD-10-CM | POA: Diagnosis not present

## 2020-12-24 DIAGNOSIS — Z79811 Long term (current) use of aromatase inhibitors: Secondary | ICD-10-CM | POA: Insufficient documentation

## 2020-12-24 DIAGNOSIS — F32A Depression, unspecified: Secondary | ICD-10-CM | POA: Diagnosis not present

## 2020-12-24 DIAGNOSIS — R232 Flushing: Secondary | ICD-10-CM | POA: Insufficient documentation

## 2020-12-24 DIAGNOSIS — Z79899 Other long term (current) drug therapy: Secondary | ICD-10-CM | POA: Insufficient documentation

## 2020-12-24 DIAGNOSIS — L609 Nail disorder, unspecified: Secondary | ICD-10-CM | POA: Diagnosis not present

## 2020-12-24 DIAGNOSIS — Z803 Family history of malignant neoplasm of breast: Secondary | ICD-10-CM | POA: Diagnosis not present

## 2020-12-24 LAB — CBC WITH DIFFERENTIAL/PLATELET
Abs Immature Granulocytes: 0.02 10*3/uL (ref 0.00–0.07)
Basophils Absolute: 0.1 10*3/uL (ref 0.0–0.1)
Basophils Relative: 1 %
Eosinophils Absolute: 0.6 10*3/uL — ABNORMAL HIGH (ref 0.0–0.5)
Eosinophils Relative: 8 %
HCT: 43.4 % (ref 36.0–46.0)
Hemoglobin: 13.9 g/dL (ref 12.0–15.0)
Immature Granulocytes: 0 %
Lymphocytes Relative: 40 %
Lymphs Abs: 3.3 10*3/uL (ref 0.7–4.0)
MCH: 29.1 pg (ref 26.0–34.0)
MCHC: 32 g/dL (ref 30.0–36.0)
MCV: 91 fL (ref 80.0–100.0)
Monocytes Absolute: 0.7 10*3/uL (ref 0.1–1.0)
Monocytes Relative: 8 %
Neutro Abs: 3.6 10*3/uL (ref 1.7–7.7)
Neutrophils Relative %: 43 %
Platelets: 254 10*3/uL (ref 150–400)
RBC: 4.77 MIL/uL (ref 3.87–5.11)
RDW: 13.9 % (ref 11.5–15.5)
WBC: 8.3 10*3/uL (ref 4.0–10.5)
nRBC: 0 % (ref 0.0–0.2)

## 2020-12-24 LAB — COMPREHENSIVE METABOLIC PANEL
ALT: 18 U/L (ref 0–44)
AST: 20 U/L (ref 15–41)
Albumin: 4.1 g/dL (ref 3.5–5.0)
Alkaline Phosphatase: 76 U/L (ref 38–126)
Anion gap: 7 (ref 5–15)
BUN: 11 mg/dL (ref 8–23)
CO2: 27 mmol/L (ref 22–32)
Calcium: 10 mg/dL (ref 8.9–10.3)
Chloride: 107 mmol/L (ref 98–111)
Creatinine, Ser: 0.93 mg/dL (ref 0.44–1.00)
GFR, Estimated: >60 mL/min (ref >60)
Glucose, Bld: 95 mg/dL (ref 70–99)
Potassium: 4.5 mmol/L (ref 3.5–5.1)
Sodium: 141 mmol/L (ref 135–145)
Total Bilirubin: 0.7 mg/dL (ref 0.3–1.2)
Total Protein: 6.9 g/dL (ref 6.5–8.1)

## 2020-12-24 MED ORDER — EXEMESTANE 25 MG PO TABS: 25.0000 mg | ORAL_TABLET | Freq: Every day | ORAL | 4 refills | Status: DC

## 2021-01-01 ENCOUNTER — Other Ambulatory Visit: Payer: Self-pay

## 2021-01-01 ENCOUNTER — Ambulatory Visit
Admission: RE | Admit: 2021-01-01 | Discharge: 2021-01-01 | Disposition: A | Discharge status: Home / Self Care | Payer: Medicare Other | Source: Ambulatory Visit | Attending: Oncology | Admitting: Oncology

## 2021-01-01 DIAGNOSIS — R922 Inconclusive mammogram: Secondary | ICD-10-CM | POA: Diagnosis not present

## 2021-01-01 DIAGNOSIS — H16223 Keratoconjunctivitis sicca, not specified as Sjogren's, bilateral: Secondary | ICD-10-CM | POA: Diagnosis not present

## 2021-01-01 DIAGNOSIS — C50412 Malignant neoplasm of upper-outer quadrant of left female breast: Secondary | ICD-10-CM

## 2021-01-23 ENCOUNTER — Emergency Department (HOSPITAL_COMMUNITY): Payer: Medicare Other

## 2021-01-23 ENCOUNTER — Emergency Department (HOSPITAL_COMMUNITY)
Admission: EM | Admit: 2021-01-23 | Discharge: 2021-01-23 | Disposition: A | Discharge status: Home / Self Care | Payer: Medicare Other | Attending: Emergency Medicine | Admitting: Emergency Medicine

## 2021-01-23 ENCOUNTER — Other Ambulatory Visit: Payer: Self-pay

## 2021-01-23 ENCOUNTER — Encounter (HOSPITAL_COMMUNITY): Payer: Self-pay | Admitting: Emergency Medicine

## 2021-01-23 DIAGNOSIS — R0789 Other chest pain: Secondary | ICD-10-CM | POA: Diagnosis not present

## 2021-01-23 DIAGNOSIS — M545 Low back pain, unspecified: Secondary | ICD-10-CM | POA: Insufficient documentation

## 2021-01-23 DIAGNOSIS — I499 Cardiac arrhythmia, unspecified: Secondary | ICD-10-CM | POA: Diagnosis not present

## 2021-01-23 DIAGNOSIS — Z5321 Procedure and treatment not carried out due to patient leaving prior to being seen by health care provider: Secondary | ICD-10-CM | POA: Insufficient documentation

## 2021-01-23 DIAGNOSIS — R079 Chest pain, unspecified: Secondary | ICD-10-CM | POA: Diagnosis not present

## 2021-01-23 DIAGNOSIS — K449 Diaphragmatic hernia without obstruction or gangrene: Secondary | ICD-10-CM | POA: Diagnosis not present

## 2021-01-23 DIAGNOSIS — I4510 Unspecified right bundle-branch block: Secondary | ICD-10-CM | POA: Diagnosis not present

## 2021-01-23 DIAGNOSIS — M549 Dorsalgia, unspecified: Secondary | ICD-10-CM | POA: Diagnosis not present

## 2021-01-23 LAB — CBC
HCT: 40.1 % (ref 36.0–46.0)
Hemoglobin: 12.8 g/dL (ref 12.0–15.0)
MCH: 29 pg (ref 26.0–34.0)
MCHC: 31.9 g/dL (ref 30.0–36.0)
MCV: 90.7 fL (ref 80.0–100.0)
Platelets: 259 10*3/uL (ref 150–400)
RBC: 4.42 MIL/uL (ref 3.87–5.11)
RDW: 14 % (ref 11.5–15.5)
WBC: 11.9 10*3/uL — ABNORMAL HIGH (ref 4.0–10.5)
nRBC: 0 % (ref 0.0–0.2)

## 2021-01-23 LAB — BASIC METABOLIC PANEL
Anion gap: 11 (ref 5–15)
BUN: 12 mg/dL (ref 8–23)
CO2: 23 mmol/L (ref 22–32)
Calcium: 9.4 mg/dL (ref 8.9–10.3)
Chloride: 106 mmol/L (ref 98–111)
Creatinine, Ser: 0.85 mg/dL (ref 0.44–1.00)
GFR, Estimated: >60 mL/min (ref >60)
Glucose, Bld: 116 mg/dL — ABNORMAL HIGH (ref 70–99)
Potassium: 3.6 mmol/L (ref 3.5–5.1)
Sodium: 140 mmol/L (ref 135–145)

## 2021-01-23 LAB — TROPONIN I (HIGH SENSITIVITY): Troponin I (High Sensitivity): 2 ng/L (ref <18)

## 2021-01-23 NOTE — ED Triage Notes (Signed)
Pt to triage via [facility] from home.  Reports pain to back that radiates to L shoulder and L chest x 45 min with SOB.  Pt took ASA 324mg  and EMS administered NTG x 2 and states pain decreased from 8 to 6.  Pt states she didn't notice a change with NTG however she denies chest pain at present and reports lower back pain.  20g RAC.

## 2021-01-23 NOTE — ED Notes (Signed)
Pt stated she was leaving due to wait time.  

## 2021-01-25 DIAGNOSIS — E039 Hypothyroidism, unspecified: Secondary | ICD-10-CM | POA: Diagnosis not present

## 2021-01-25 DIAGNOSIS — R109 Unspecified abdominal pain: Secondary | ICD-10-CM | POA: Diagnosis not present

## 2021-01-25 DIAGNOSIS — R3129 Other microscopic hematuria: Secondary | ICD-10-CM | POA: Diagnosis not present

## 2021-01-25 DIAGNOSIS — R3121 Asymptomatic microscopic hematuria: Secondary | ICD-10-CM | POA: Diagnosis not present

## 2021-01-25 DIAGNOSIS — N200 Calculus of kidney: Secondary | ICD-10-CM | POA: Diagnosis not present

## 2021-01-25 DIAGNOSIS — F419 Anxiety disorder, unspecified: Secondary | ICD-10-CM | POA: Diagnosis not present

## 2021-01-26 ENCOUNTER — Other Ambulatory Visit: Payer: Self-pay | Admitting: Internal Medicine

## 2021-01-26 ENCOUNTER — Ambulatory Visit
Admission: RE | Admit: 2021-01-26 | Discharge: 2021-01-26 | Disposition: A | Discharge status: Home / Self Care | Payer: Medicare Other | Source: Ambulatory Visit | Attending: Internal Medicine | Admitting: Internal Medicine

## 2021-01-26 DIAGNOSIS — C50912 Malignant neoplasm of unspecified site of left female breast: Secondary | ICD-10-CM | POA: Diagnosis not present

## 2021-01-26 DIAGNOSIS — K449 Diaphragmatic hernia without obstruction or gangrene: Secondary | ICD-10-CM | POA: Diagnosis not present

## 2021-01-26 DIAGNOSIS — N200 Calculus of kidney: Secondary | ICD-10-CM

## 2021-01-26 DIAGNOSIS — R319 Hematuria, unspecified: Secondary | ICD-10-CM | POA: Diagnosis not present

## 2021-02-09 DIAGNOSIS — C50412 Malignant neoplasm of upper-outer quadrant of left female breast: Secondary | ICD-10-CM | POA: Diagnosis not present

## 2021-02-10 DIAGNOSIS — C50412 Malignant neoplasm of upper-outer quadrant of left female breast: Secondary | ICD-10-CM | POA: Diagnosis not present

## 2021-02-10 DIAGNOSIS — J029 Acute pharyngitis, unspecified: Secondary | ICD-10-CM | POA: Diagnosis not present

## 2021-02-10 DIAGNOSIS — Z1152 Encounter for screening for COVID-19: Secondary | ICD-10-CM | POA: Diagnosis not present

## 2021-02-11 ENCOUNTER — Other Ambulatory Visit: Payer: Self-pay | Admitting: Oncology

## 2021-02-11 NOTE — Progress Notes (Signed)
Dr. Donne Hazel has evaluated the patient and tells me he has referred her to physical therapy.  He suggests against MRI at this point as it likely would lead to false positives.  The patient is in agreement with this.

## 2021-02-15 NOTE — Progress Notes (Signed)
Pt called in and left message wanting to cancel video visit for 02/24/21. Pt stated she was doing fine on her medication (exemestane) and does not feel that she needs the video visit.  I will update Dr Jana Hakim  And reschedule as needed.

## 2021-02-17 ENCOUNTER — Other Ambulatory Visit: Payer: Self-pay

## 2021-02-17 ENCOUNTER — Ambulatory Visit: Payer: Medicare Other | Attending: General Surgery | Admitting: Physical Therapy

## 2021-02-17 DIAGNOSIS — G8929 Other chronic pain: Secondary | ICD-10-CM | POA: Diagnosis not present

## 2021-02-17 DIAGNOSIS — M6281 Muscle weakness (generalized): Secondary | ICD-10-CM | POA: Insufficient documentation

## 2021-02-17 DIAGNOSIS — M25512 Pain in left shoulder: Secondary | ICD-10-CM | POA: Insufficient documentation

## 2021-02-17 DIAGNOSIS — L599 Disorder of the skin and subcutaneous tissue related to radiation, unspecified: Secondary | ICD-10-CM | POA: Insufficient documentation

## 2021-02-17 NOTE — Therapy (Signed)
[place], [place], [address] Phone: [phone]   Fax:  [phone]  Physical Therapy Evaluation  Patient Details  Name: [name] MRN: [id number] Date of Birth: 1946/03/28 Referring Provider (PT): Dr. Donne Hazel    Encounter Date: 02/17/2021   PT End of Session - 02/17/21 1901    Visit Number 1    Number of Visits 13    Date for PT Re-Evaluation 03/31/21    PT Start Time 1600    PT Stop Time 1645    PT Time Calculation (min) 45 min    Activity Tolerance Patient tolerated treatment well    Behavior During Therapy [facility] for tasks assessed/performed           Past Medical History:  Diagnosis Date  . Arthritis    hands and knees  . Complication of anesthesia    aspiration pna; following a colonoscopy, pt requests head of bed elevated if possible.  . Constipation   . Cough   . Depression   . Dysrhythmia    RBBB on 06-06-17 ekg   . Elevated liver function tests   . Esophageal stricture   . Family history of breast cancer   . Gallstones   . Genetic testing 04/02/2018   STAT Breast panel with reflex to Multi-Cancer panel (83 genes) @ [facility] - No pathogenic mutations detected  . GERD (gastroesophageal reflux disease)   . Hiatal hernia   . History of kidney stones   . History of radiation therapy 07/09/2018- 08/03/18   Left Breast/ 40.05 Gy in 15 fractions. Left Breast boost 10 Gy in 5 fractions.   . Hyperlipemia   . Hypothyroidism   . Insomnia   . Malignant neoplasm of upper-outer quadrant of left female breast ([place])   . Nephrolithiasis   . Pneumonia 2015   aspirated after colonosopy  . Renal cyst     Past Surgical History:  Procedure Laterality Date  . BREAST BIOPSY Left 05/31/2019   mri  . BREAST BIOPSY Left 04/2019   malignant  . BREAST LUMPECTOMY Left 04/17/2018   malignant  . BREAST LUMPECTOMY WITH RADIOACTIVE SEED AND SENTINEL LYMPH NODE BIOPSY Left 04/17/2018   Procedure:  LEFT BREAST LUMPECTOMY WITH BRACKETED RADIOACTIVE SEEDS AND SENTINEL LYMPH NODE BIOPSY;  Surgeon: Rolm Bookbinder, MD;  Location: [facility];  Service: General;  Laterality: Left;  . BREAST SURGERY Left    Lumpectomy  . CARDIOVASCULAR STRESS TEST  07/19/2006   EF 70%, NO EVIDENCE OF ISCHEMIA  . CHOLECYSTECTOMY  12/30/10  . COLONOSCOPY    . DILATION AND CURETTAGE OF UTERUS    . HYSTEROSCOPY WITH D & C N/A 06/07/2018   Procedure: DILATATION AND CURETTAGE /HYSTEROSCOPY;  Surgeon: [name], MD;  Location: [facility];  Service: Gynecology;  Laterality: N/A;  . inguinal herniography    . NASAL SINUS SURGERY    . RADIOACTIVE SEED GUIDED EXCISIONAL BREAST BIOPSY Right 04/17/2018   Procedure: RIGHT BREAST SEED GUIDED EXCISIONAL BIOPSY;  Surgeon: Rolm Bookbinder, MD;  Location: [facility];  Service: General;  Laterality: Right;  . RE-EXCISION OF BREAST LUMPECTOMY Left 05/15/2018   Procedure: RE-EXCISION OF LEFT BREAST LUMPECTOMY, ASPIRATION OF LEFT AXILLARY SEROMA;  Surgeon: Rolm Bookbinder, MD;  Location: [facility];  Service: General;  Laterality: Left;  . T & A    . TOOTH EXTRACTION    . TOTAL KNEE ARTHROPLASTY Left 09/11/2017   Procedure: LEFT TOTAL KNEE ARTHROPLASTY;  Surgeon:  Gaynelle Arabian, MD;  Location: [facility];  Service: Orthopedics;  Laterality: Left;  with block  . US ECHOCARDIOGRAPHY  07/17/2006   EF 55-60%    There were no vitals filed for this visit.    Subjective Assessment - 02/17/21 1506    Subjective Pt comes back to PT for help pain in the left axilla, going into the left breast and down into the left arm. She is also having with back pain that goes down the sciatic nerve pain on the right leg  . She is still having trouble with her left knee and is having symptoms from her anti-estrogen medication. Pt plans to have PT to help her sciatic nerve pain    Pertinent History Patient was diagnosed on 03/08/18 with left grade 1-2 invasive  ductal carcinoma breast cancer.It measured 1.5 cm and is located in the upper outer quadrant. It is ER/PR positive and HER2 negative with a Ki67 of < 1%.  She had a left total knee replacement in 10/18 with Dr. Wynelle Link and continues to have pain in her left lateral knee. Her orthopedist reported it is likely due to her iliotibial band. Patient reports she underwent a left lumpectomy and SNLB (0/4 nodes positive) removed on 04/17/18.She completed radiation, but no chemo    Patient Stated Goals to get rid of the left armpit and arm pain    Currently in Pain? Yes    Pain Score 6     Pain Location Axilla    Pain Orientation Left    Pain Descriptors / Indicators Sharp    Pain Type Chronic pain    Pain Radiating Towards to breat and axilla    Pain Onset More than a month ago   off and on since surgery   Pain Frequency Intermittent    Aggravating Factors  pressing on the lateral chest and breast makes it worse    Pain Relieving Factors notheing makes it better    Effect of Pain on Daily Activities limits reaching    Pain Radiating Towards down left arm              [facility] PT Assessment - 02/17/21 0001      Assessment   Medical Diagnosis s/p left lumpectomy and SLNB    Referring Provider (PT) Dr. Donne Hazel     Onset Date/Surgical Date 04/17/18      Precautions   Precautions Other (comment)    Precaution Comments Left arm lymphedema       Restrictions   Weight Bearing Restrictions No      Balance Screen   Has the patient fallen in the past 6 months No    Has the patient had a decrease in activity level because of a fear of falling?  Yes   fear of hurting   Is the patient reluctant to leave their home because of a fear of falling?  Yes   because of a fear of hurting     [place] residence    Living Arrangements Spouse/significant other    Available Help at Discharge Family      Prior Function   Level of [place]  Retired    Leisure pt plans to return to her fitness club when it opens in a few months      Cognition   Overall Cognitive Status Within Functional Limits for tasks assessed      Observation/Other Assessments   Observations visible kyphotic, foward  head posture, appears to be deconditioned with dry skin and muscle atrophy      Sensation   Light Touch Not tested      Coordination   Gross Motor Movements are Fluid and Coordinated No   slow, guarded, limited     Posture/Postural Control   Posture/Postural Control Postural limitations    Postural Limitations Rounded Shoulders;Forward head;Increased thoracic kyphosis      ROM / Strength   AROM / PROM / Strength AROM;Strength      AROM   Overall AROM  Deficits    Overall AROM Comments deferred eval of back and LE's    AROM Assessment Site Shoulder    Right Shoulder Extension --    Right Shoulder Flexion 146 Degrees    Right Shoulder ABduction 165 Degrees    Right Shoulder Internal Rotation --    Right Shoulder External Rotation 90 Degrees    Left Shoulder Extension --    Left Shoulder Flexion 134 Degrees    Left Shoulder ABduction 130 Degrees   pulling into chest   Left Shoulder Internal Rotation --    Left Shoulder External Rotation 90 Degrees    Right Knee Extension --    Right Knee Flexion --    Left Knee Extension --    Left Knee Flexion --    Cervical Flexion --    Cervical Extension --    Cervical - Right Side Bend --    Cervical - Left Side Bend --    Cervical - Right Rotation --    Cervical - Left Rotation --      Strength   Overall Strength Deficits    Overall Strength Comments appears deconditoned,    Strength Assessment Site Shoulder    Right/Left Shoulder Right;Left    Right Shoulder Flexion 4-/5    Right Shoulder ABduction 4-/5    Left Shoulder Flexion 3-/5   full active range limited by pain   Left Shoulder ABduction 3-/5   full active range limited by pain     Palpation   Patella mobility --     Palpation comment pt with sharp pain with palpation at left lateral breast and chest, tight tender palpation of pec major muscle             LYMPHEDEMA/ONCOLOGY QUESTIONNAIRE - 02/17/21 0001      Type   Cancer Type Left breast      Surgeries   Lumpectomy Date 04/17/18    Sentinel Lymph Node Biopsy Date 04/17/18      Treatment   Active Chemotherapy Treatment No    Past Chemotherapy Treatment No    Active Radiation Treatment No    Past Radiation Treatment Yes    Current Hormone Treatment Yes      What other symptoms do you have   Are you Having Heaviness or Tightness Yes    Are you having Pain Yes    Are you having pitting edema No    Is it Hard or Difficult finding clothes that fit No    Do you have infections No    Is there Decreased scar mobility Yes    [place] Sign No      Lymphedema Assessments   Lymphedema Assessments Upper extremities      Right Upper Extremity Lymphedema   10 cm Proximal to Olecranon Process 31 cm    Olecranon Process 25.5 cm    15 cm Proximal to Ulnar Styloid Process 24 cm    10 cm  Proximal to Ulnar Styloid Process 20 cm    Just Proximal to Ulnar Styloid Process 15 cm    Across Hand at [employer] 17.5 cm    At [place] of 2nd Digit 6 cm      Left Upper Extremity Lymphedema   10 cm Proximal to Olecranon Process 32 cm    Olecranon Process 26 cm    15 cm Proximal to Ulnar Styloid Process 23.5 cm    10 cm Proximal to Ulnar Styloid Process 19.7 cm    Just Proximal to Ulnar Styloid Process 15.5 cm    Across Hand at [employer] 17.7 cm    At [place] of 2nd Digit 5.9 cm                   Objective measurements completed on examination: See above findings.       [place] Adult PT Treatment/Exercise - 02/17/21 0001      Self-Care   Other Self-Care Comments  instruction in use of pillow just to fill up space at neck in supine, instructed in active neck retraction with back up against the wall                    PT Short  Term Goals - 02/17/21 1919      PT SHORT TERM GOAL #1   Title Pt will report that she has had a reduction of discomfort in her left axilla and lateral chest to 5/10    Baseline 6/10    Time 3    Period Weeks    Status New      PT SHORT TERM GOAL #2   Title Pt will report she is doing a beginning home exericse program for postural and strength exercises    Time 3    Period Weeks    Status New             PT Long Term Goals - 02/17/21 1921      PT LONG TERM GOAL #1   Title Pt will have painfree left shoulder abduction of 145 degrees so that she can reach into a kitchen cabinet at home    Baseline 134 degrees with 6/10 pain on 02/17/2021    Time 6    Period Weeks    Status New      PT LONG TERM GOAL #2   Title Pt will be independent in progressive resistive home exercise program for strength and fitness    Time 6    Period Weeks    Status New      PT LONG TERM GOAL #3   Title Pt will report that her overall pain and discomfort is decreased by 50%    Time 6    Period Weeks    Status New                  Plan - 02/17/21 1902    Clinical Impression Statement Pt returns to PT in hopes to decrease the pain in her left axilla, lateral chest and breast.  She also has had increase in forward head kyphotic posture and advancement of deconditioning since she has not been able to go to her fitness center that closed due to Covid and she has had a flare of up of sciatic pain in her right leg. She still struggles with pain in her left knee despite extensive PT that limits her walking. Pt also wants to get PT to help with  her sciatic pain.    Personal Factors and Comorbidities Comorbidity 3+    Comorbidities Previous Lt TKR with persistent pain, Previous Lt breast lumpectomy and radiation, right sciatic pain    Stability/Clinical Decision Making Stable/Uncomplicated    Clinical Decision Making Low    Rehab Potential Good    Clinical Impairments Affecting Rehab Potential pain in  left leg, pain in right leg, decreased tolerance of gait due to pain,    PT Frequency 2x / week    PT Duration 6 weeks    PT Treatment/Interventions ADLs/Self Care Home Management;Therapeutic activities;Therapeutic exercise;Patient/family education;Manual techniques;Manual lymph drainage;Scar mobilization;Passive range of motion;Moist Heat;Balance training;Functional mobility training;Orthotic Fit/Training;Taping;Compression bandaging    PT Next Visit Plan exericises for postural retraining and strengthening manual work for soft tissue mobilizaion and myofascial release to tight tissue on left chest,    Consulted and Agree with Plan of Care Patient           Patient will benefit from skilled therapeutic intervention in order to improve the following deficits and impairments:  Decreased knowledge of precautions,Impaired UE functional use,Decreased range of motion,Postural dysfunction,Decreased scar mobility,Decreased mobility,Pain,Increased fascial restricitons,Increased edema,Abnormal gait,Difficulty walking,Decreased endurance,Decreased activity tolerance,Impaired perceived functional ability,Decreased knowledge of use of DME,Decreased strength  Visit Diagnosis: Chronic left shoulder pain - Plan: PT plan of care cert/re-cert  Disorder of the skin and subcutaneous tissue related to radiation, unspecified - Plan: PT plan of care cert/re-cert  Muscle weakness (generalized) - Plan: PT plan of care cert/re-cert     Problem List Patient Active Problem List   Diagnosis Date Noted  . Breast mass, right 03/27/2020  . Hepatic steatosis 01/22/2020  . Genetic testing 04/02/2018  . Family history of breast cancer   . Malignant neoplasm of upper-outer quadrant of left breast in female, estrogen receptor positive ([place]) 03/16/2018  . OA (osteoarthritis) of knee 09/11/2017  . Infrapatellar bursitis of right knee 04/29/2014  . Pneumonia, aspiration ([place]) 12/28/2012  . HYPOTHYROIDISM 11/15/2010  .  HYPERLIPIDEMIA 11/15/2010  . DEPRESSION 11/15/2010  . GERD 11/15/2010  . CONSTIPATION 11/15/2010  . GALLSTONES 11/15/2010  . RENAL CYST 11/15/2010  . INSOMNIA UNSPECIFIED 11/15/2010  . COUGH 11/15/2010  . ABDOMINAL PAIN OTHER SPECIFIED SITE 11/15/2010  . TRANSAMINASES, SERUM, ELEVATED 11/15/2010   Donato Heinz. Owens Shark PT  Norwood Levo 02/17/2021, 7:27 PM  [place], [place], [address] Phone: [phone]   Fax:  [phone]  Name: JAYMEE TILSON MRN: [id number] Date of Birth: 1946-12-07

## 2021-02-18 DIAGNOSIS — M1990 Unspecified osteoarthritis, unspecified site: Secondary | ICD-10-CM | POA: Diagnosis not present

## 2021-02-18 DIAGNOSIS — N951 Menopausal and female climacteric states: Secondary | ICD-10-CM | POA: Diagnosis not present

## 2021-02-18 DIAGNOSIS — M5441 Lumbago with sciatica, right side: Secondary | ICD-10-CM | POA: Diagnosis not present

## 2021-02-18 DIAGNOSIS — M8589 Other specified disorders of bone density and structure, multiple sites: Secondary | ICD-10-CM | POA: Diagnosis not present

## 2021-02-19 ENCOUNTER — Telehealth: Payer: Self-pay | Admitting: Oncology

## 2021-02-19 ENCOUNTER — Telehealth: Payer: Self-pay

## 2021-02-19 NOTE — Telephone Encounter (Signed)
Pt did not want to do 02/24/21 video appointment . Dr Jana Hakim ok with pt waiting a few months before coming in.

## 2021-02-19 NOTE — Telephone Encounter (Signed)
Scheduled appt per 3/11 sch msg. Pt aware.

## 2021-02-24 ENCOUNTER — Inpatient Hospital Stay: Payer: Medicare Other | Admitting: Oncology

## 2021-02-26 ENCOUNTER — Other Ambulatory Visit: Payer: Medicare Other

## 2021-03-02 ENCOUNTER — Ambulatory Visit: Payer: Medicare Other

## 2021-03-02 ENCOUNTER — Other Ambulatory Visit: Payer: Self-pay

## 2021-03-02 DIAGNOSIS — G8929 Other chronic pain: Secondary | ICD-10-CM

## 2021-03-02 DIAGNOSIS — L599 Disorder of the skin and subcutaneous tissue related to radiation, unspecified: Secondary | ICD-10-CM | POA: Diagnosis not present

## 2021-03-02 DIAGNOSIS — M6281 Muscle weakness (generalized): Secondary | ICD-10-CM | POA: Diagnosis not present

## 2021-03-02 DIAGNOSIS — M25512 Pain in left shoulder: Secondary | ICD-10-CM | POA: Diagnosis not present

## 2021-03-02 NOTE — Therapy (Signed)
[place], [place], [address] Phone: [phone]   Fax:  [phone]  Physical Therapy Treatment  Patient Details  Name: [name] MRN: [id number] Date of Birth: Jul 10, 1946 Referring Provider (PT): Dr. Donne Hazel    Encounter Date: 03/02/2021   PT End of Session - 03/02/21 1055    Visit Number 2    Number of Visits 13    Date for PT Re-Evaluation 03/31/21    PT Start Time 1004    PT Stop Time 1051    PT Time Calculation (min) 47 min    Activity Tolerance Patient tolerated treatment well    Behavior During Therapy [facility] for tasks assessed/performed           Past Medical History:  Diagnosis Date  . Arthritis    hands and knees  . Complication of anesthesia    aspiration pna; following a colonoscopy, pt requests head of bed elevated if possible.  . Constipation   . Cough   . Depression   . Dysrhythmia    RBBB on 06-06-17 ekg   . Elevated liver function tests   . Esophageal stricture   . Family history of breast cancer   . Gallstones   . Genetic testing 04/02/2018   STAT Breast panel with reflex to Multi-Cancer panel (83 genes) @ [facility] - No pathogenic mutations detected  . GERD (gastroesophageal reflux disease)   . Hiatal hernia   . History of kidney stones   . History of radiation therapy 07/09/2018- 08/03/18   Left Breast/ 40.05 Gy in 15 fractions. Left Breast boost 10 Gy in 5 fractions.   . Hyperlipemia   . Hypothyroidism   . Insomnia   . Malignant neoplasm of upper-outer quadrant of left female breast ([place])   . Nephrolithiasis   . Pneumonia 2015   aspirated after colonosopy  . Renal cyst     Past Surgical History:  Procedure Laterality Date  . BREAST BIOPSY Left 05/31/2019   mri  . BREAST BIOPSY Left 04/2019   malignant  . BREAST LUMPECTOMY Left 04/17/2018   malignant  . BREAST LUMPECTOMY WITH RADIOACTIVE SEED AND SENTINEL LYMPH NODE BIOPSY Left 04/17/2018   Procedure:  LEFT BREAST LUMPECTOMY WITH BRACKETED RADIOACTIVE SEEDS AND SENTINEL LYMPH NODE BIOPSY;  Surgeon: Rolm Bookbinder, MD;  Location: [facility];  Service: General;  Laterality: Left;  . BREAST SURGERY Left    Lumpectomy  . CARDIOVASCULAR STRESS TEST  07/19/2006   EF 70%, NO EVIDENCE OF ISCHEMIA  . CHOLECYSTECTOMY  12/30/10  . COLONOSCOPY    . DILATION AND CURETTAGE OF UTERUS    . HYSTEROSCOPY WITH D & C N/A 06/07/2018   Procedure: DILATATION AND CURETTAGE /HYSTEROSCOPY;  Surgeon: [name], MD;  Location: [facility];  Service: Gynecology;  Laterality: N/A;  . inguinal herniography    . NASAL SINUS SURGERY    . RADIOACTIVE SEED GUIDED EXCISIONAL BREAST BIOPSY Right 04/17/2018   Procedure: RIGHT BREAST SEED GUIDED EXCISIONAL BIOPSY;  Surgeon: Rolm Bookbinder, MD;  Location: [facility];  Service: General;  Laterality: Right;  . RE-EXCISION OF BREAST LUMPECTOMY Left 05/15/2018   Procedure: RE-EXCISION OF LEFT BREAST LUMPECTOMY, ASPIRATION OF LEFT AXILLARY SEROMA;  Surgeon: Rolm Bookbinder, MD;  Location: [facility];  Service: General;  Laterality: Left;  . T & A    . TOOTH EXTRACTION    . TOTAL KNEE ARTHROPLASTY Left 09/11/2017   Procedure: LEFT TOTAL KNEE ARTHROPLASTY;  Surgeon:  Gaynelle Arabian, MD;  Location[facility];  Service: Orthopedics;  Laterality: Left;  with block  . US ECHOCARDIOGRAPHY  07/17/2006   EF 55-60%    There were no vitals filed for this visit.   Subjective Assessment - 03/02/21 1005    Subjective Had pain for 2 days after doing the wall exercises last time. Today pain is low right now 2/10, but can spike up at any time.    Pertinent History Patient was diagnosed on 03/08/18 with left grade 1-2 invasive ductal carcinoma breast cancer.It measured 1.5 cm and is located in the upper outer quadrant. It is ER/PR positive and HER2 negative with a Ki67 of < 1%.  She had a left total knee replacement in 10/18 with Dr. Wynelle Link and continues  to have pain in her left lateral knee. Her orthopedist reported it is likely due to her iliotibial band. Patient reports she underwent a left lumpectomy and SNLB (0/4 nodes positive) removed on 04/17/18.She completed radiation, but no chemo    Patient Stated Goals to get rid of the left armpit and arm pain    Currently in Pain? Yes    Pain Score 2     Pain Location Arm    Pain Orientation Left    Pain Descriptors / Indicators Sharp;Aching    Pain Onset More than a month ago    Pain Frequency Intermittent    Pain Relieving Factors aleve helps    Pain Type Chronic pain    Pain Onset More than a month ago    Pain Frequency Constant    Pain Relieving Factors Aleve helps                             [facility] Adult PT Treatment/Exercise - 03/02/21 0001      Shoulder Exercises: Supine   Other Supine Exercises AROM flex x 2 held secondary to increased achiness left shoulder joint      Manual Therapy   Manual therapy comments good tolerance for gentle PROM    Soft tissue mobilization Left pectoralis, border of axilla, left trunk area, left incisions and lats, medial and lateral upper arm    Myofascial Release left chest and lateral trunk    Passive ROM Gentle PROM left shoulder flex, scaption, ER to gentle stretch only                    PT Short Term Goals - 02/17/21 1919      PT SHORT TERM GOAL #1   Title Pt will report that she has had a reduction of discomfort in her left axilla and lateral chest to 5/10    Baseline 6/10    Time 3    Period Weeks    Status New      PT SHORT TERM GOAL #2   Title Pt will report she is doing a beginning home exericse program for postural and strength exercises    Time 3    Period Weeks    Status New             PT Long Term Goals - 02/17/21 1921      PT LONG TERM GOAL #1   Title Pt will have painfree left shoulder abduction of 145 degrees so that she can reach into a kitchen cabinet at home    Baseline 134 degrees  with 6/10 pain on 02/17/2021    Time 6    Period  Weeks    Status New      PT LONG TERM GOAL #2   Title Pt will be independent in progressive resistive home exercise program for strength and fitness    Time 6    Period Weeks    Status New      PT LONG TERM GOAL #3   Title Pt will report that her overall pain and discomfort is decreased by 50%    Time 6    Period Weeks    Status New                 Plan - 03/02/21 1056    Clinical Impression Statement Pt was hurting for 2 days after doing wall slides at first visit.  Treatment today focused on gaining pt confidence with soft tissue mobilization to pectoralis, axillary border, lats, medial and lateral upper arm.  Gentle PROM was well tolerated to point of mild stretch in left breast/trunk.  Attempted supine AROM flexion but held after 2 reps secondary to aching in left shoulder joint.  Pt felt good after rx.    Personal Factors and Comorbidities Comorbidity 3+    Comorbidities Previous Lt TKR with persistent pain, Previous Lt breast lumpectomy and radiation, right sciatic pain    Stability/Clinical Decision Making Stable/Uncomplicated    Rehab Potential Good    Clinical Impairments Affecting Rehab Potential pain in left leg, pain in right leg, decreased tolerance of gait due to pain,    PT Frequency 2x / week    PT Duration 6 weeks    PT Treatment/Interventions ADLs/Self Care Home Management;Therapeutic activities;Therapeutic exercise;Patient/family education;Manual techniques;Manual lymph drainage;Scar mobilization;Passive range of motion;Moist Heat;Balance training;Functional mobility training;Orthotic Fit/Training;Taping;Compression bandaging    PT Next Visit Plan exericises for postural retraining and strengthening manual work for soft tissue mobilizaion and myofascial release to tight tissue on left chest,    Consulted and Agree with Plan of Care Patient           Patient will benefit from skilled therapeutic intervention  in order to improve the following deficits and impairments:  Decreased knowledge of precautions,Impaired UE functional use,Decreased range of motion,Postural dysfunction,Decreased scar mobility,Decreased mobility,Pain,Increased fascial restricitons,Increased edema,Abnormal gait,Difficulty walking,Decreased endurance,Decreased activity tolerance,Impaired perceived functional ability,Decreased knowledge of use of DME,Decreased strength  Visit Diagnosis: Chronic left shoulder pain  Disorder of the skin and subcutaneous tissue related to radiation, unspecified  Muscle weakness (generalized)     Problem List Patient Active Problem List   Diagnosis Date Noted  . Breast mass, right 03/27/2020  . Hepatic steatosis 01/22/2020  . Genetic testing 04/02/2018  . Family history of breast cancer   . Malignant neoplasm of upper-outer quadrant of left breast in female, estrogen receptor positive ([place]) 03/16/2018  . OA (osteoarthritis) of knee 09/11/2017  . Infrapatellar bursitis of right knee 04/29/2014  . Pneumonia, aspiration ([place]) 12/28/2012  . HYPOTHYROIDISM 11/15/2010  . HYPERLIPIDEMIA 11/15/2010  . DEPRESSION 11/15/2010  . GERD 11/15/2010  . CONSTIPATION 11/15/2010  . GALLSTONES 11/15/2010  . RENAL CYST 11/15/2010  . INSOMNIA UNSPECIFIED 11/15/2010  . COUGH 11/15/2010  . ABDOMINAL PAIN OTHER SPECIFIED SITE 11/15/2010  . TRANSAMINASES, SERUM, ELEVATED 11/15/2010    [facility] 03/02/2021, 11:00 AM  [place], [place], [address] Phone: [phone]   Fax:  [phone]  Name: [name] MRN: [id number] Date of Birth: 1946-10-02  Cheral Almas, PT 03/02/21 11:01 AM

## 2021-03-04 ENCOUNTER — Ambulatory Visit: Payer: Medicare Other | Admitting: Physical Therapy

## 2021-03-04 ENCOUNTER — Other Ambulatory Visit: Payer: Self-pay

## 2021-03-04 ENCOUNTER — Encounter: Payer: Self-pay | Admitting: Physical Therapy

## 2021-03-04 DIAGNOSIS — G8929 Other chronic pain: Secondary | ICD-10-CM

## 2021-03-04 DIAGNOSIS — M25512 Pain in left shoulder: Secondary | ICD-10-CM | POA: Diagnosis not present

## 2021-03-04 DIAGNOSIS — L599 Disorder of the skin and subcutaneous tissue related to radiation, unspecified: Secondary | ICD-10-CM

## 2021-03-04 DIAGNOSIS — M6281 Muscle weakness (generalized): Secondary | ICD-10-CM | POA: Diagnosis not present

## 2021-03-04 NOTE — Therapy (Signed)
[place], [place], [address] Phone: [phone]   Fax:  [phone]  Physical Therapy Treatment  Patient Details  Name: RAYVEN RETTIG MRN: [id number] Date of Birth: 02/03/1946 Referring Provider (PT): Dr. Donne Hazel    Encounter Date: 03/04/2021   PT End of Session - 03/04/21 1722    Visit Number 3    Number of Visits 13    Date for PT Re-Evaluation 03/31/21    PT Start Time 1400    PT Stop Time 1445    PT Time Calculation (min) 45 min    Activity Tolerance Patient tolerated treatment well    Behavior During Therapy [facility] for tasks assessed/performed           Past Medical History:  Diagnosis Date  . Arthritis    hands and knees  . Complication of anesthesia    aspiration pna; following a colonoscopy, pt requests head of bed elevated if possible.  . Constipation   . Cough   . Depression   . Dysrhythmia    RBBB on 06-06-17 ekg   . Elevated liver function tests   . Esophageal stricture   . Family history of breast cancer   . Gallstones   . Genetic testing 04/02/2018   STAT Breast panel with reflex to Multi-Cancer panel (83 genes) @ [facility] - No pathogenic mutations detected  . GERD (gastroesophageal reflux disease)   . Hiatal hernia   . History of kidney stones   . History of radiation therapy 07/09/2018- 08/03/18   Left Breast/ 40.05 Gy in 15 fractions. Left Breast boost 10 Gy in 5 fractions.   . Hyperlipemia   . Hypothyroidism   . Insomnia   . Malignant neoplasm of upper-outer quadrant of left female breast ([place])   . Nephrolithiasis   . Pneumonia 2015   aspirated after colonosopy  . Renal cyst     Past Surgical History:  Procedure Laterality Date  . BREAST BIOPSY Left 05/31/2019   mri  . BREAST BIOPSY Left 04/2019   malignant  . BREAST LUMPECTOMY Left 04/17/2018   malignant  . BREAST LUMPECTOMY WITH RADIOACTIVE SEED AND SENTINEL LYMPH NODE BIOPSY Left 04/17/2018   Procedure:  LEFT BREAST LUMPECTOMY WITH BRACKETED RADIOACTIVE SEEDS AND SENTINEL LYMPH NODE BIOPSY;  Surgeon: Rolm Bookbinder, MD;  Location: [facility];  Service: General;  Laterality: Left;  . BREAST SURGERY Left    Lumpectomy  . CARDIOVASCULAR STRESS TEST  07/19/2006   EF 70%, NO EVIDENCE OF ISCHEMIA  . CHOLECYSTECTOMY  12/30/10  . COLONOSCOPY    . DILATION AND CURETTAGE OF UTERUS    . HYSTEROSCOPY WITH D & C N/A 06/07/2018   Procedure: DILATATION AND CURETTAGE /HYSTEROSCOPY;  Surgeon: [name], MD;  Location: [facility];  Service: Gynecology;  Laterality: N/A;  . inguinal herniography    . NASAL SINUS SURGERY    . RADIOACTIVE SEED GUIDED EXCISIONAL BREAST BIOPSY Right 04/17/2018   Procedure: RIGHT BREAST SEED GUIDED EXCISIONAL BIOPSY;  Surgeon: Rolm Bookbinder, MD;  Location: [facility];  Service: General;  Laterality: Right;  . RE-EXCISION OF BREAST LUMPECTOMY Left 05/15/2018   Procedure: RE-EXCISION OF LEFT BREAST LUMPECTOMY, ASPIRATION OF LEFT AXILLARY SEROMA;  Surgeon: Rolm Bookbinder, MD;  Location: [facility];  Service: General;  Laterality: Left;  . T & A    . TOOTH EXTRACTION    . TOTAL KNEE ARTHROPLASTY Left 09/11/2017   Procedure: LEFT TOTAL KNEE ARTHROPLASTY;  Surgeon:  Gaynelle Arabian, MD;  Location: [facility];  Service: Orthopedics;  Laterality: Left;  with block  . US ECHOCARDIOGRAPHY  07/17/2006   EF 55-60%    There were no vitals filed for this visit.   Subjective Assessment - 03/04/21 1403    Subjective Pt is about the same. She has pain in left shoulder and pain gets worse as the day goes on and toward evening its about a 7    Pertinent History Patient was diagnosed on 03/08/18 with left grade 1-2 invasive ductal carcinoma breast cancer.It measured 1.5 cm and is located in the upper outer quadrant. It is ER/PR positive and HER2 negative with a Ki67 of < 1%.  She had a left total knee replacement in 10/18 with Dr. Wynelle Link and continues to  have pain in her left lateral knee. Her orthopedist reported it is likely due to her iliotibial band. Patient reports she underwent a left lumpectomy and SNLB (0/4 nodes positive) removed on 04/17/18.She completed radiation, but no chemo    Patient Stated Goals to get rid of the left armpit and arm pain                             [facility] Adult PT Treatment/Exercise - 03/04/21 0001      Exercises   Exercises Knee/Hip;Other Exercises      Knee/Hip Exercises: Stretches   Other Knee/Hip Stretches sitting sciatic nerve glides      Knee/Hip Exercises: Standing   Functional Squat Limitations pt holding her purse at her chest, 3 sets of 3 sit to stands from high mat. no c/o pain      Shoulder Exercises: Sidelying   External Rotation AROM;Left;5 reps    ABduction AROM;Left;5 reps   short range of motion     Manual Therapy   Manual Therapy Edema management;Soft tissue mobilization;Myofascial release;Manual Lymphatic Drainage (MLD)    Soft tissue mobilization Left pectoralis, border of axilla, left trunk area, left incisions and lats.    Myofascial Release left chest and lateral trunk prologed pressure with pac major tight area between thumb and finger    Manual Lymphatic Drainage (MLD) short neck, deep breaths, gentile stationary circles at supraclavicular areas , across chest left to right and along left lateral trunk    Passive ROM manual left shoulder isometrics 5 reps with 5 sec holds                    PT Short Term Goals - 02/17/21 1919      PT SHORT TERM GOAL #1   Title Pt will report that she has had a reduction of discomfort in her left axilla and lateral chest to 5/10    Baseline 6/10    Time 3    Period Weeks    Status New      PT SHORT TERM GOAL #2   Title Pt will report she is doing a beginning home exericse program for postural and strength exercises    Time 3    Period Weeks    Status New             PT Long Term Goals - 02/17/21 1921       PT LONG TERM GOAL #1   Title Pt will have painfree left shoulder abduction of 145 degrees so that she can reach into a kitchen cabinet at home    Baseline 134 degrees with 6/10 pain on 02/17/2021  Time 6    Period Weeks    Status New      PT LONG TERM GOAL #2   Title Pt will be independent in progressive resistive home exercise program for strength and fitness    Time 6    Period Weeks    Status New      PT LONG TERM GOAL #3   Title Pt will report that her overall pain and discomfort is decreased by 50%    Time 6    Period Weeks    Status New                 Plan - 03/04/21 1722    Clinical Impression Statement Pt continues to have multiple areas of pain. She tolerated treatment well today and was able to start doing top  squats using her purse for a functional weight at chest. and performed sitting sciatic nerve glide exercises.  encouaged pt to be more mindful of her posture.    Personal Factors and Comorbidities Comorbidity 3+    Comorbidities Previous Lt TKR with persistent pain, Previous Lt breast lumpectomy and radiation, right sciatic pain    Stability/Clinical Decision Making Stable/Uncomplicated    Rehab Potential Good    Clinical Impairments Affecting Rehab Potential pain in left leg, pain in right leg, decreased tolerance of gait due to pain,    PT Frequency 2x / week    PT Duration 6 weeks    PT Treatment/Interventions ADLs/Self Care Home Management;Therapeutic activities;Therapeutic exercise;Patient/family education;Manual techniques;Manual lymph drainage;Scar mobilization;Passive range of motion;Moist Heat;Balance training;Functional mobility training;Orthotic Fit/Training;Taping;Compression bandaging    PT Next Visit Plan exericises for postural retraining and strengthening manual work for soft tissue mobilizaion and myofascial release to tight tissue on left chest,  talk to [place] how to coordinate visits    Consulted and Agree with Plan of Care  Patient           Patient will benefit from skilled therapeutic intervention in order to improve the following deficits and impairments:  Decreased knowledge of precautions,Impaired UE functional use,Decreased range of motion,Postural dysfunction,Decreased scar mobility,Decreased mobility,Pain,Increased fascial restricitons,Increased edema,Abnormal gait,Difficulty walking,Decreased endurance,Decreased activity tolerance,Impaired perceived functional ability,Decreased knowledge of use of DME,Decreased strength  Visit Diagnosis: Chronic left shoulder pain  Disorder of the skin and subcutaneous tissue related to radiation, unspecified  Muscle weakness (generalized)     Problem List Patient Active Problem List   Diagnosis Date Noted  . Breast mass, right 03/27/2020  . Hepatic steatosis 01/22/2020  . Genetic testing 04/02/2018  . Family history of breast cancer   . Malignant neoplasm of upper-outer quadrant of left breast in female, estrogen receptor positive ([place]) 03/16/2018  . OA (osteoarthritis) of knee 09/11/2017  . Infrapatellar bursitis of right knee 04/29/2014  . Pneumonia, aspiration ([place]) 12/28/2012  . HYPOTHYROIDISM 11/15/2010  . HYPERLIPIDEMIA 11/15/2010  . DEPRESSION 11/15/2010  . GERD 11/15/2010  . CONSTIPATION 11/15/2010  . GALLSTONES 11/15/2010  . RENAL CYST 11/15/2010  . INSOMNIA UNSPECIFIED 11/15/2010  . COUGH 11/15/2010  . ABDOMINAL PAIN OTHER SPECIFIED SITE 11/15/2010  . TRANSAMINASES, SERUM, ELEVATED 11/15/2010   Donato Heinz. Owens Shark PT  [place] 03/04/2021, [place], [place], [address] Phone: [phone]   Fax:  [phone]  Name: AUTUMNE KALLIO MRN: [id number] Date of Birth: [birth year]

## 2021-03-05 ENCOUNTER — Telehealth: Payer: Self-pay | Admitting: Oncology

## 2021-03-05 ENCOUNTER — Telehealth: Payer: Self-pay

## 2021-03-05 ENCOUNTER — Encounter: Payer: Medicare Other | Admitting: Physical Therapy

## 2021-03-05 NOTE — Telephone Encounter (Signed)
Scheduled per sch msg. Called and spoke with patient. Confirmed appt  

## 2021-03-05 NOTE — Telephone Encounter (Signed)
Pt called stating she would like to stop examestane d/t following s/sx: "extreme hot flashes, extremely tired, bad memory." Pt states she is going to stop taking examestane until she can speak with Dr Jana Hakim about anastrazole or fulvestrant. Message sent to scheduling to expedite appt.

## 2021-03-08 NOTE — Progress Notes (Signed)
[place]  [phone] [phone]) [phone]     ID: [name] DOB: [birth year]  MR#: [id number]  ZMO#:[id number]  Patient Care Team: Shon Baton, MD as PCP - General Rolm Bookbinder, MD as Consulting Physician (General Surgery) Willadean Guyton, Virgie Dad, MD as Consulting Physician (Oncology) Eppie Gibson, MD as Attending Physician (Radiation Oncology) Lelon Perla, MD as Consulting Physician (Cardiology) [name], MD as Consulting Physician (Obstetrics and Gynecology) Jacquelynn Cree, PT as Physical Therapist (Physical Therapy) Donzetta Sprung., MD as Referring Physician (Sports Medicine) OTHER MD:  I connected with [name] on 03/09/21 at  1:15 PM EDT by telephone visit and verified that I am speaking with the correct person using two identifiers.   I discussed the limitations, risks, security and privacy concerns of performing an evaluation and management service by telemedicine and the availability of in-person appointments. I also discussed with the patient that there may be a patient responsible charge related to this service. The patient expressed understanding and agreed to proceed.   Other persons participating in the visit and their role in the encounter: husband  Patient's location: home Provider's location: [place]  Total time spent: 15 min   CHIEF COMPLAINT: Estrogen receptor positive breast cancer  CURRENT TREATMENT: Exemestane   INTERVAL HISTORY: [name] was contacted today for follow-up of her estrogen receptor positive breast cancer.  Her husband [name] participated in the call as well.  She was started on exemestane at her last visit on 12/24/2020.  She did well initially, then started having horrible hot flashes significant problems with arthralgias and myalgias.  She got some more of the cognitive issues she has been experiencing and she feels very tired.  Her husband told her she related to  take off this medicine and she called her son went off about a week ago   REVIEW OF SYSTEMS: [name] tells me since going off the exemestane she has felt a little better.  The hot flashes are still above baseline and so is the arthralgias it is getting better.  She is having some discomfort in the pectoralis major area on the surgical side.  She is getting some physical therapy and she hopes that will help.  She is also planning to join a gym.  A detailed review of systems was otherwise stable   COVID 19 VACCINATION STATUS: Status post Moderna x2, most recently February 2021, with booster October 2022    HISTORY OF CURRENT ILLNESS: From the original intake note:  "[name]" had routine diagnostic mammography on 03/08/2018 showing breast density category D.  There was a 1.5 cm irregular mass in the left upper outer quadrant posterior depth.  This was further imaged with ultrasonography on the same day.  The left axilla was sonographically benign.  No abnormalities were seen in the left axilla.  Accordingly on 03/08/2018 she proceeded to biopsy of the left breast area in question. The pathology from this procedure showed ([id number]): Invasive ductal carcinoma, grade I-II. HER-2 negative with the ratio being 1.61 and number per cell 3.30.  The tumor was estrogen receptor 100% positive and progesterone receptor 100% positive, both with strong staining intensity.  Ki67 was <1%.   On 03/18/2018, she underwent a bilateral breast MRI with and without contrast, showing the mass in question in a setting of 3.3 cm non-masslike enhancement in the posterior aspect of the left upper-outer quadrant. in addition there was a 1.1 cm mass in the left upper-outer quadrant located 2.1  cm anterior and lateral to the biopsy proven malignant.   Right breast showed two indeterminate 5 mm adjacent enhancing nodules in the upper-inner quadrant.   The patient's subsequent history is as detailed below.   PAST MEDICAL  HISTORY: Past Medical History:  Diagnosis Date  . Arthritis    hands and knees  . Complication of anesthesia    aspiration pna; following a colonoscopy, pt requests head of bed elevated if possible.  . Constipation   . Cough   . Depression   . Dysrhythmia    RBBB on 06-06-17 ekg   . Elevated liver function tests   . Esophageal stricture   . Family history of breast cancer   . Gallstones   . Genetic testing 04/02/2018   STAT Breast panel with reflex to Multi-Cancer panel (83 genes) @ [facility] - No pathogenic mutations detected  . GERD (gastroesophageal reflux disease)   . Hiatal hernia   . History of kidney stones   . History of radiation therapy 07/09/2018- 08/03/18   Left Breast/ 40.05 Gy in 15 fractions. Left Breast boost 10 Gy in 5 fractions.   . Hyperlipemia   . Hypothyroidism   . Insomnia   . Malignant neoplasm of upper-outer quadrant of left female breast (HCC)   . Nephrolithiasis   . Pneumonia 2015   aspirated after colonosopy  . Renal cyst     PAST SURGICAL HISTORY: Past Surgical History:  Procedure Laterality Date  . BREAST BIOPSY Left 05/31/2019   mri  . BREAST BIOPSY Left 04/2019   malignant  . BREAST LUMPECTOMY Left 04/17/2018   malignant  . BREAST LUMPECTOMY WITH RADIOACTIVE SEED AND SENTINEL LYMPH NODE BIOPSY Left 04/17/2018   Procedure: LEFT BREAST LUMPECTOMY WITH BRACKETED RADIOACTIVE SEEDS AND SENTINEL LYMPH NODE BIOPSY;  Surgeon: Emelia Loron, MD;  Location: [facility];  Service: General;  Laterality: Left;  . BREAST SURGERY Left    Lumpectomy  . CARDIOVASCULAR STRESS TEST  07/19/2006   EF 70%, NO EVIDENCE OF ISCHEMIA  . CHOLECYSTECTOMY  12/30/10  . COLONOSCOPY    . DILATION AND CURETTAGE OF UTERUS    . HYSTEROSCOPY WITH D & C N/A 06/07/2018   Procedure: DILATATION AND CURETTAGE /HYSTEROSCOPY;  Surgeon: Marcelle Overlie, MD;  Location: [facility];  Service: Gynecology;  Laterality: N/A;  . inguinal herniography    . NASAL SINUS SURGERY     . RADIOACTIVE SEED GUIDED EXCISIONAL BREAST BIOPSY Right 04/17/2018   Procedure: RIGHT BREAST SEED GUIDED EXCISIONAL BIOPSY;  Surgeon: Emelia Loron, MD;  Location: [facility];  Service: General;  Laterality: Right;  . RE-EXCISION OF BREAST LUMPECTOMY Left 05/15/2018   Procedure: RE-EXCISION OF LEFT BREAST LUMPECTOMY, ASPIRATION OF LEFT AXILLARY SEROMA;  Surgeon: Emelia Loron, MD;  Location: [facility];  Service: General;  Laterality: Left;  . T & A    . TOOTH EXTRACTION    . TOTAL KNEE ARTHROPLASTY Left 09/11/2017   Procedure: LEFT TOTAL KNEE ARTHROPLASTY;  Surgeon: Ollen Gross, MD;  Location: [facility];  Service: Orthopedics;  Laterality: Left;  with block  . US ECHOCARDIOGRAPHY  07/17/2006   EF 55-60%    FAMILY HISTORY Family History  Problem Relation Age of Onset  . Breast cancer Mother 18       deceased 29; TAH/BSO in 39s/50s  . Dementia Father        deceased 29  . Breast cancer Maternal Aunt 58       deceased 50s  . Colon  cancer Neg Hx   . Stomach cancer Neg Hx   Her father died of old age at 34 years old. Her mother had breast cancer at 58 and died at 75 years old. She will undergo genetic testing. Her maternal aunt had breast cancer at 6 years old. No family history of ovarian cancer. She had one brother and one sister.    GYNECOLOGIC HISTORY:  No LMP recorded. Patient is postmenopausal. Menarche: 75 years old [place] P 0 LMP: 1997 Contraceptive: 2 years HRT: Estrogen and progesterone for about 7 years  Hysterectomy? No   SOCIAL HISTORY:  She is a retired Futures trader. She has been married to [place], for 45 plus years. He worked in the [employer]. Together they adopted two children. One daughter, [name], 22 of [place], who is a Psychologist, sport and exercise for an OBGYN, and one son, Avamae Dehaan, 36 of [place], who owns a Marketing executive business. The patient has three grandchildren. She attends [employer].    ADVANCED DIRECTIVES: In place   HEALTH MAINTENANCE: Social History   Tobacco Use  . Smoking status: Never Smoker  . Smokeless tobacco: Never Used  Vaping Use  . Vaping Use: Never used  Substance Use Topics  . Alcohol use: No  . Drug use: No    Colonoscopy: 2014/ [facility]  PAP: 2018  Bone density:  May 2014 at [facility] showed a T score of -0.9 normal   Allergies  Allergen Reactions  . Oxycontin [Oxycodone Hcl]     "it made me feel very high and hyper"   . Codeine Nausea And Vomiting    Current Outpatient Medications  Medication Sig Dispense Refill  . acetaminophen (TYLENOL) 500 MG tablet Take 500 mg by mouth every 6 (six) hours as needed for mild pain or headache. (Patient not taking: Reported on 02/17/2021)    . azelastine (ASTELIN) 0.1 % nasal spray Place 1 spray into both nostrils 2 (two) times daily. Use in each nostril as directed    . Cholecalciferol (VITAMIN D3) 1000 units CAPS Take 1 capsule by mouth 2 (two) times daily.     Marland Kitchen escitalopram (LEXAPRO) 10 MG tablet Take 15 mg by mouth daily.    Marland Kitchen esomeprazole (NEXIUM) 40 MG capsule Take 40 mg by mouth 2 (two) times daily before a meal.    . exemestane (AROMASIN) 25 MG tablet Take 1 tablet (25 mg total) by mouth daily after breakfast. 90 tablet 4  . ezetimibe (ZETIA) 10 MG tablet Take 10 mg by mouth at bedtime.    . fluticasone (FLONASE) 50 MCG/ACT nasal spray Place 1 spray into both nostrils daily.    Marland Kitchen levothyroxine (SYNTHROID, LEVOTHROID) 50 MCG tablet Take 50 mcg by mouth at bedtime.    . Magnesium 250 MG TABS Take 1 tablet by mouth daily.     . naproxen sodium (ALEVE) 220 MG tablet Take 220 mg by mouth 2 (two) times daily as needed (pain).    Marland Kitchen OVER THE COUNTER MEDICATION Apply 1 application topically daily as needed (pain). Natural pain relief ointment    . Probiotic Product (PROBIOTIC ADVANCED PO) Take 1 tablet by mouth daily.    . rosuvastatin (CRESTOR) 5 MG tablet Take 5 mg by mouth at bedtime.     . sodium  chloride (OCEAN) 0.65 % SOLN nasal spray Place 4 sprays into both nostrils 4 (four) times daily.    . SUPER B COMPLEX/C PO Take 1 tablet by mouth daily.    Marland Kitchen  zolpidem (AMBIEN) 10 MG tablet zolpidem 10 mg tablet  take 1 tablet by mouth at bedtime if needed     No current facility-administered medications for this visit.    OBJECTIVE: white woman  There were no vitals filed for this visit.   There is no height or weight on file to calculate BMI.   Wt Readings from Last 3 Encounters:  12/24/20 165 lb 3.2 oz (74.9 kg)  10/08/20 159 lb (72.1 kg)  08/04/20 157 lb 9.6 oz (71.5 kg)      ECOG FS:1 - Symptomatic but completely ambulatory  Telemedicine visit 03/09/2021   LAB RESULTS:  CMP     Component Value Date/Time   NA 140 01/23/2021 1644   NA 144 06/06/2017 1448   K 3.6 01/23/2021 1644   CL 106 01/23/2021 1644   CO2 23 01/23/2021 1644   GLUCOSE 116 (H) 01/23/2021 1644   BUN 12 01/23/2021 1644   BUN 12 06/06/2017 1448   CREATININE 0.85 01/23/2021 1644   CREATININE 0.80 11/26/2019 1024   CALCIUM 9.4 01/23/2021 1644   PROT 6.9 12/24/2020 0840   PROT 6.3 06/06/2017 1448   ALBUMIN 4.1 12/24/2020 0840   ALBUMIN 4.4 06/06/2017 1448   AST 20 12/24/2020 0840   AST 45 (H) 11/26/2019 1024   ALT 18 12/24/2020 0840   ALT 31 11/26/2019 1024   ALKPHOS 76 12/24/2020 0840   BILITOT 0.7 12/24/2020 0840   BILITOT 0.7 11/26/2019 1024   GFRNONAA >60 01/23/2021 1644   GFRNONAA >60 11/26/2019 1024   GFRAA >60 06/29/2020 1237   GFRAA >60 11/26/2019 1024    No results found for: TOTALPROTELP, ALBUMINELP, A1GS, A2GS, BETS, BETA2SER, GAMS, MSPIKE, SPEI  No results found for: KPAFRELGTCHN, LAMBDASER, KAPLAMBRATIO  Lab Results  Component Value Date   WBC 11.9 (H) 01/23/2021   NEUTROABS 3.6 12/24/2020   HGB 12.8 01/23/2021   HCT 40.1 01/23/2021   MCV 90.7 01/23/2021   PLT 259 01/23/2021   No results found for: LABCA2  No components found for: ZDGLOV564  No results for input(s): INR  in the last 168 hours.  No results found for: LABCA2  No results found for: PPI951  No results found for: OAC166  No results found for: AYT016  No results found for: CA2729  No components found for: HGQUANT  No results found for: CEA1 / No results found for: CEA1   No results found for: AFPTUMOR  No results found for: CHROMOGRNA  No results found for: HGBA, HGBA2QUANT, HGBFQUANT, HGBSQUAN (Hemoglobinopathy evaluation)   No results found for: LDH  No results found for: IRON, TIBC, IRONPCTSAT (Iron and TIBC)  No results found for: FERRITIN  Urinalysis    Component Value Date/Time   COLORURINE YELLOW 08/22/2016 1947   APPEARANCEUR CLEAR 08/22/2016 1947   LABSPEC 1.026 08/22/2016 1947   PHURINE 5.5 08/22/2016 1947   GLUCOSEU NEGATIVE 08/22/2016 1947   HGBUR SMALL (A) 08/22/2016 1947   BILIRUBINUR NEGATIVE 08/22/2016 [place] 08/22/2016 1947   PROTEINUR NEGATIVE 08/22/2016 1947   UROBILINOGEN 0.2 08/22/2016 1622   NITRITE NEGATIVE 08/22/2016 1947   LEUKOCYTESUR SMALL (A) 08/22/2016 1947    STUDIES: No results found.   ASSESSMENT: 75 y.o. [place] woman status post left breast upper outer quadrant biopsy 03/08/2018 for a clinical T1c-T2 N0, stage I invasive ductal carcinoma, grade 1 or 2, estrogen and progesterone receptor positive, HER-2 not amplified, with an MIB-1 of less than 1%  (1) additional biopsies as follows:  (a)  right breast biopsy ([id number]) on 04/02/2018 showed atypical lobular hyperplasia  (b) left breast ([id number]) on 03/22/2018 showed invasive lobular carcinoma, grade 2, estrogen and progesterone receptor positive with an MIB-1 of 5%. HER-2 not amplified  (2) bilateral lumpectomies and left sentinel lymph node sampling 04/17/2018 found  (a) on the right side, atypical lobular hyperplasia  (b) on the left side, an mpT1c pN0, stage IA, grade 2 invasive lobular carcinoma, with positive margins  (c) additional left breast  surgery on 05/15/2018 cleared the compromise margins  (3) The Oncotype DX score was 14, predicting a risk of outside the breast recurrence over the next 9 years of 4% if the patient's only systemic therapy is tamoxifen for 5 years.  It also predicts no significant benefit from chemotherapy.  (4) adjuvant radiation 07/09/2018 - 08/03/2018 Site/dose:    1. Left Breast / 40.05 Gy in 15 fractions 2. Left Breast Boost / 10 Gy in 5 fractions  (5) started tamoxifen 09/11/2018, discontinued June 2020 with cognitive issues   (6) genetics testing 03/22/2018 through [facility]'s STAT Breast panel with reflex to Multi-Cancer panel found no pathogenic mutations in (ALK, APC, ATM, AXIN2, BAP1, BARD1, BLM, BMPR1A, BRCA1, BRCA2, BRIP1, CASR, CDC73, CDH1, CDK4, CDKN1B, CDKN1C, CDKN2A, CEBPA, CHEK2, CTNNA1, DICER1, DIS3L2, EGFR, EPCAM, FH, FLCN, GATA2, GPC3, GREM1, HOXB13, HRAS, KIT, MAX, MEN1, MET, MITF, MLH1, MSH2, MSH3, MSH6, MUTYH, NBN, NF1, NF2, NTHL1, PALB2, PDGFRA, PHOX2B, PMS2, POLD1, POLE, POT1, PRKAR1A, PTCH1, PTEN, RAD50, RAD51C, RAD51D, RB1, RECQL4, RET, RUNX1, SDHA, SDHAF2, SDHB, SDHC, SDHD, SMAD4, SMARCA4, SMARCB1, SMARCE1, STK11, SUFU, TERC, TERT, TMEM127, TP53, TSC1, TSC2, VHL, WRN, WT1).  (a) Variants of Uncertain Significance were found in in PDGFRA and RECQL4.  (7) started letrozole 09/26/2019, discontinued 11/26/2019 with cognitive issues  (a) bone density May 2014 normal, T score -0.9  (b) letrozole resumed 2020-01-22, taken Mondays, Wednesday, Friday, as being off medication made no difference to either hot flashes or cognitive deficiencies  (c) letrozole discontinued October 2021 with multiple side effects  (8) exemestane prescribed 12/24/2020, discontinued mid March 2022 with intolerable side effect  (9) anastrozole to start 03/15/2021   PLAN: [name] states account of her experience been exemestane is very credible.  It started fairly easily but as time went on the symptoms got more  intense.  Unfortunately that is the way it happens when it happens.  We could not continue that medication which has been stopped.  She tells me her Sister Waldo Laine has been on anastrozole for years with no side effects and she wonders if she should try that medication.  I was glad to write the prescription for her.  Were going to wait until 03/15/2021 to start to make sure the exemestane is a little bit more out of her system.  I will catch up with her late May.  If she tolerates anastrozole well of course of the plan will be to continue that a total of 5 years.  Otherwise we will consider Faslodex.     Iqra Rotundo, Virgie Dad, MD  03/09/21 1:23 PM Medical Oncology and Hematology [facility] [place], [place] [address] Tel. [phone]    Fax. [phone]   I, Wilburn Mylar, am acting as scribe for Dr. Virgie Dad. Atticus Lemberger.  I, Lurline Del MD, have reviewed the above documentation for accuracy and completeness, and I agree with the above.   *Total Encounter Time as defined by the Centers for Medicare and Medicaid Services includes, in addition to the face-to-face time  of a patient visit (documented in the note above) non-face-to-face time: obtaining and reviewing outside history, ordering and reviewing medications, tests or procedures, care coordination (communications with other health care professionals or caregivers) and documentation in the medical record.

## 2021-03-09 ENCOUNTER — Other Ambulatory Visit: Payer: Self-pay

## 2021-03-09 ENCOUNTER — Inpatient Hospital Stay (HOSPITAL_BASED_OUTPATIENT_CLINIC_OR_DEPARTMENT_OTHER): Payer: Medicare Other | Admitting: Oncology

## 2021-03-09 ENCOUNTER — Ambulatory Visit: Payer: Medicare Other | Admitting: Physical Therapy

## 2021-03-09 ENCOUNTER — Encounter: Payer: Self-pay | Admitting: Physical Therapy

## 2021-03-09 DIAGNOSIS — L599 Disorder of the skin and subcutaneous tissue related to radiation, unspecified: Secondary | ICD-10-CM

## 2021-03-09 DIAGNOSIS — M25512 Pain in left shoulder: Secondary | ICD-10-CM | POA: Diagnosis not present

## 2021-03-09 DIAGNOSIS — Z170 Estrogen receptor positive status [ER+]: Secondary | ICD-10-CM

## 2021-03-09 DIAGNOSIS — G8929 Other chronic pain: Secondary | ICD-10-CM | POA: Diagnosis not present

## 2021-03-09 DIAGNOSIS — C50412 Malignant neoplasm of upper-outer quadrant of left female breast: Secondary | ICD-10-CM

## 2021-03-09 DIAGNOSIS — M6281 Muscle weakness (generalized): Secondary | ICD-10-CM | POA: Diagnosis not present

## 2021-03-09 MED ORDER — ANASTROZOLE 1 MG PO TABS: 1.0000 mg | ORAL_TABLET | Freq: Every day | ORAL | 4 refills | Status: DC

## 2021-03-09 NOTE — Therapy (Signed)
[place], [place], [address] Phone: [phone]   Fax:  [phone]  Physical Therapy Treatment  Patient Details  Name: [name] MRN: [id number] Date of Birth: 30-Jan-1946 Referring Provider (PT): Dr. Donne Hazel    Encounter Date: 03/09/2021   PT End of Session - 03/09/21 1556    Visit Number 4    Number of Visits 13    Date for PT Re-Evaluation 03/31/21    PT Start Time 1500    PT Stop Time 1545    PT Time Calculation (min) 45 min    Activity Tolerance Patient tolerated treatment well    Behavior During Therapy [facility] for tasks assessed/performed           Past Medical History:  Diagnosis Date  . Arthritis    hands and knees  . Complication of anesthesia    aspiration pna; following a colonoscopy, pt requests head of bed elevated if possible.  . Constipation   . Cough   . Depression   . Dysrhythmia    RBBB on 06-06-17 ekg   . Elevated liver function tests   . Esophageal stricture   . Family history of breast cancer   . Gallstones   . Genetic testing 04/02/2018   STAT Breast panel with reflex to Multi-Cancer panel (83 genes) @ [facility] - No pathogenic mutations detected  . GERD (gastroesophageal reflux disease)   . Hiatal hernia   . History of kidney stones   . History of radiation therapy 07/09/2018- 08/03/18   Left Breast/ 40.05 Gy in 15 fractions. Left Breast boost 10 Gy in 5 fractions.   . Hyperlipemia   . Hypothyroidism   . Insomnia   . Malignant neoplasm of upper-outer quadrant of left female breast ([place])   . Nephrolithiasis   . Pneumonia 2015   aspirated after colonosopy  . Renal cyst     Past Surgical History:  Procedure Laterality Date  . BREAST BIOPSY Left 05/31/2019   mri  . BREAST BIOPSY Left 04/2019   malignant  . BREAST LUMPECTOMY Left 04/17/2018   malignant  . BREAST LUMPECTOMY WITH RADIOACTIVE SEED AND SENTINEL LYMPH NODE BIOPSY Left 04/17/2018   Procedure:  LEFT BREAST LUMPECTOMY WITH BRACKETED RADIOACTIVE SEEDS AND SENTINEL LYMPH NODE BIOPSY;  Surgeon: Rolm Bookbinder, MD;  Location: [facility];  Service: General;  Laterality: Left;  . BREAST SURGERY Left    Lumpectomy  . CARDIOVASCULAR STRESS TEST  07/19/2006   EF 70%, NO EVIDENCE OF ISCHEMIA  . CHOLECYSTECTOMY  12/30/10  . COLONOSCOPY    . DILATION AND CURETTAGE OF UTERUS    . HYSTEROSCOPY WITH D & C N/A 06/07/2018   Procedure: DILATATION AND CURETTAGE /HYSTEROSCOPY;  Surgeon: [name], MD;  Location: [facility];  Service: Gynecology;  Laterality: N/A;  . inguinal herniography    . NASAL SINUS SURGERY    . RADIOACTIVE SEED GUIDED EXCISIONAL BREAST BIOPSY Right 04/17/2018   Procedure: RIGHT BREAST SEED GUIDED EXCISIONAL BIOPSY;  Surgeon: Rolm Bookbinder, MD;  Location: [facility];  Service: General;  Laterality: Right;  . RE-EXCISION OF BREAST LUMPECTOMY Left 05/15/2018   Procedure: RE-EXCISION OF LEFT BREAST LUMPECTOMY, ASPIRATION OF LEFT AXILLARY SEROMA;  Surgeon: Rolm Bookbinder, MD;  Location: [facility];  Service: General;  Laterality: Left;  . T & A    . TOOTH EXTRACTION    . TOTAL KNEE ARTHROPLASTY Left 09/11/2017   Procedure: LEFT TOTAL KNEE ARTHROPLASTY;  Surgeon:  Gaynelle Arabian, MD;  Location: [facility];  Service: Orthopedics;  Laterality: Left;  with block  . US ECHOCARDIOGRAPHY  07/17/2006   EF 55-60%    There were no vitals filed for this visit.   Subjective Assessment - 03/09/21 1509    Subjective Pt states she got some improvement from the treatment last time.    Pertinent History Patient was diagnosed on 03/08/18 with left grade 1-2 invasive ductal carcinoma breast cancer.It measured 1.5 cm and is located in the upper outer quadrant. It is ER/PR positive and HER2 negative with a Ki67 of < 1%.  She had a left total knee replacement in 10/18 with Dr. Wynelle Link and continues to have pain in her left lateral knee. Her orthopedist  reported it is likely due to her iliotibial band. Patient reports she underwent a left lumpectomy and SNLB (0/4 nodes positive) removed on 04/17/18.She completed radiation, but no chemo    Patient Stated Goals to get rid of the left armpit and arm pain    Currently in Pain? Yes    Pain Score 7     Pain Location Arm    Pain Orientation Left    Pain Descriptors / Indicators Aching    Pain Onset More than a month ago    Aggravating Factors  reaching up in cabinet                             [facility] Adult PT Treatment/Exercise - 03/09/21 0001      Knee/Hip Exercises: Standing   Functional Squat Limitations pt holding her purse at her chest, 10 reps  sit to stands from high mat. no c/o pain      Manual Therapy   Manual Therapy Edema management;Soft tissue mobilization;Myofascial release;Manual Lymphatic Drainage (MLD)    Soft tissue mobilization Left pectoralis, border of axilla, left trunk area, left incisions and lats.    Myofascial Release left chest and lateral trunk prologed pressure with pac major tight area between thumb and finger    Manual Lymphatic Drainage (MLD) short neck, deep breaths, gentile stationary circles at supraclavicular areas , across chest left to right and along left lateral trunk    Passive ROM manual left shoulder isometrics 5 reps with 5 sec holds                    PT Short Term Goals - 02/17/21 1919      PT SHORT TERM GOAL #1   Title Pt will report that she has had a reduction of discomfort in her left axilla and lateral chest to 5/10    Baseline 6/10    Time 3    Period Weeks    Status New      PT SHORT TERM GOAL #2   Title Pt will report she is doing a beginning home exericse program for postural and strength exercises    Time 3    Period Weeks    Status New             PT Long Term Goals - 02/17/21 1921      PT LONG TERM GOAL #1   Title Pt will have painfree left shoulder abduction of 145 degrees so that she can  reach into a kitchen cabinet at home    Baseline 134 degrees with 6/10 pain on 02/17/2021    Time 6    Period Weeks    Status New  PT LONG TERM GOAL #2   Title Pt will be independent in progressive resistive home exercise program for strength and fitness    Time 6    Period Weeks    Status New      PT LONG TERM GOAL #3   Title Pt will report that her overall pain and discomfort is decreased by 50%    Time 6    Period Weeks    Status New                 Plan - 03/09/21 1556    Clinical Impression Statement Pt appears to have better posture with less kyphosis when she walks in today. She reports she has been doing her scapular retraction postural exrecises.  Trigger points in pec major with soft tissue work today softenened and stroking to pec major was tolerated well in supine and sidelying. Pt felt symptomatic relief at end of session    Personal Factors and Comorbidities Comorbidity 3+    Comorbidities Previous Lt TKR with persistent pain, Previous Lt breast lumpectomy and radiation, right sciatic pain    Stability/Clinical Decision Making Stable/Uncomplicated    Rehab Potential Good    Clinical Impairments Affecting Rehab Potential pain in left leg, pain in right leg, decreased tolerance of gait due to pain,    PT Frequency 2x / week    PT Duration 6 weeks    PT Treatment/Interventions ADLs/Self Care Home Management;Therapeutic activities;Therapeutic exercise;Patient/family education;Manual techniques;Manual lymph drainage;Scar mobilization;Passive range of motion;Moist Heat;Balance training;Functional mobility training;Orthotic Fit/Training;Taping;Compression bandaging    PT Next Visit Plan exericises for postural retraining and strengthening manual work for soft tissue mobilizaion and myofascial release to tight tissue on left chest,  talk to [place] how to coordinate visits           Patient will benefit from skilled therapeutic intervention in order to  improve the following deficits and impairments:  Decreased knowledge of precautions,Impaired UE functional use,Decreased range of motion,Postural dysfunction,Decreased scar mobility,Decreased mobility,Pain,Increased fascial restricitons,Increased edema,Abnormal gait,Difficulty walking,Decreased endurance,Decreased activity tolerance,Impaired perceived functional ability,Decreased knowledge of use of DME,Decreased strength  Visit Diagnosis: Chronic left shoulder pain  Disorder of the skin and subcutaneous tissue related to radiation, unspecified  Muscle weakness (generalized)     Problem List Patient Active Problem List   Diagnosis Date Noted  . Breast mass, right 03/27/2020  . Hepatic steatosis 01/22/2020  . Genetic testing 04/02/2018  . Family history of breast cancer   . Malignant neoplasm of upper-outer quadrant of left breast in female, estrogen receptor positive ([place]) 03/16/2018  . OA (osteoarthritis) of knee 09/11/2017  . Infrapatellar bursitis of right knee 04/29/2014  . Pneumonia, aspiration ([place]) 12/28/2012  . HYPOTHYROIDISM 11/15/2010  . HYPERLIPIDEMIA 11/15/2010  . DEPRESSION 11/15/2010  . GERD 11/15/2010  . CONSTIPATION 11/15/2010  . GALLSTONES 11/15/2010  . RENAL CYST 11/15/2010  . INSOMNIA UNSPECIFIED 11/15/2010  . COUGH 11/15/2010  . ABDOMINAL PAIN OTHER SPECIFIED SITE 11/15/2010  . TRANSAMINASES, SERUM, ELEVATED 11/15/2010   Donato Heinz. Owens Shark PT  Norwood Levo 03/09/2021, 4:00 PM  [place], [place], [address] Phone: [phone]   Fax:  [phone]  Name: [name] MRN: [id number] Date of Birth: 1946/01/04

## 2021-03-11 ENCOUNTER — Telehealth: Payer: Self-pay | Admitting: Oncology

## 2021-03-11 ENCOUNTER — Ambulatory Visit: Payer: Medicare Other | Admitting: Physical Therapy

## 2021-03-11 ENCOUNTER — Other Ambulatory Visit: Payer: Self-pay

## 2021-03-11 DIAGNOSIS — G8929 Other chronic pain: Secondary | ICD-10-CM

## 2021-03-11 DIAGNOSIS — M6281 Muscle weakness (generalized): Secondary | ICD-10-CM | POA: Diagnosis not present

## 2021-03-11 DIAGNOSIS — L599 Disorder of the skin and subcutaneous tissue related to radiation, unspecified: Secondary | ICD-10-CM

## 2021-03-11 DIAGNOSIS — M25512 Pain in left shoulder: Secondary | ICD-10-CM | POA: Diagnosis not present

## 2021-03-11 NOTE — Therapy (Signed)
[place], [place], [address] Phone: [phone]   Fax:  [phone]  Physical Therapy Treatment  Patient Details  Name: [name] MRN: [id number] Date of Birth: [birth year] Referring Provider (PT): Dr. Donne Hazel    Encounter Date: 03/11/2021   PT End of Session - 03/11/21 1601    Visit Number 5    Number of Visits 13    Date for PT Re-Evaluation 03/31/21    PT Start Time 1500    PT Stop Time 1545    PT Time Calculation (min) 45 min    Activity Tolerance Patient tolerated treatment well    Behavior During Therapy [facility] for tasks assessed/performed           Past Medical History:  Diagnosis Date  . Arthritis    hands and knees  . Complication of anesthesia    aspiration pna; following a colonoscopy, pt requests head of bed elevated if possible.  . Constipation   . Cough   . Depression   . Dysrhythmia    RBBB on 06-06-17 ekg   . Elevated liver function tests   . Esophageal stricture   . Family history of breast cancer   . Gallstones   . Genetic testing 04/02/2018   STAT Breast panel with reflex to Multi-Cancer panel (83 genes) @ [facility] - No pathogenic mutations detected  . GERD (gastroesophageal reflux disease)   . Hiatal hernia   . History of kidney stones   . History of radiation therapy 07/09/2018- 08/03/18   Left Breast/ 40.05 Gy in 15 fractions. Left Breast boost 10 Gy in 5 fractions.   . Hyperlipemia   . Hypothyroidism   . Insomnia   . Malignant neoplasm of upper-outer quadrant of left female breast ([place])   . Nephrolithiasis   . Pneumonia 2015   aspirated after colonosopy  . Renal cyst     Past Surgical History:  Procedure Laterality Date  . BREAST BIOPSY Left 05/31/2019   mri  . BREAST BIOPSY Left 04/2019   malignant  . BREAST LUMPECTOMY Left 04/17/2018   malignant  . BREAST LUMPECTOMY WITH RADIOACTIVE SEED AND SENTINEL LYMPH NODE BIOPSY Left 04/17/2018   Procedure:  LEFT BREAST LUMPECTOMY WITH BRACKETED RADIOACTIVE SEEDS AND SENTINEL LYMPH NODE BIOPSY;  Surgeon: Rolm Bookbinder, MD;  Location: [facility];  Service: General;  Laterality: Left;  . BREAST SURGERY Left    Lumpectomy  . CARDIOVASCULAR STRESS TEST  07/19/2006   EF 70%, NO EVIDENCE OF ISCHEMIA  . CHOLECYSTECTOMY  12/30/10  . COLONOSCOPY    . DILATION AND CURETTAGE OF UTERUS    . HYSTEROSCOPY WITH D & C N/A 06/07/2018   Procedure: DILATATION AND CURETTAGE /HYSTEROSCOPY;  Surgeon: [name], MD;  Location: [facility];  Service: Gynecology;  Laterality: N/A;  . inguinal herniography    . NASAL SINUS SURGERY    . RADIOACTIVE SEED GUIDED EXCISIONAL BREAST BIOPSY Right 04/17/2018   Procedure: RIGHT BREAST SEED GUIDED EXCISIONAL BIOPSY;  Surgeon: Rolm Bookbinder, MD;  Location: [facility];  Service: General;  Laterality: Right;  . RE-EXCISION OF BREAST LUMPECTOMY Left 05/15/2018   Procedure: RE-EXCISION OF LEFT BREAST LUMPECTOMY, ASPIRATION OF LEFT AXILLARY SEROMA;  Surgeon: Rolm Bookbinder, MD;  Location: [facility];  Service: General;  Laterality: Left;  . T & A    . TOOTH EXTRACTION    . TOTAL KNEE ARTHROPLASTY Left 09/11/2017   Procedure: LEFT TOTAL KNEE ARTHROPLASTY;  Surgeon:  Gaynelle Arabian, MD;  Location: [facility];  Service: Orthopedics;  Laterality: Left;  with block  . US ECHOCARDIOGRAPHY  07/17/2006   EF 55-60%    There were no vitals filed for this visit.   Subjective Assessment - 03/11/21 1519    Subjective Pt states she got some improvement from the treatment last time.but the pain has returned.  It is storming outside today and she feels like the weather has something to do with it    Pertinent History Patient was diagnosed on 03/08/18 with left grade 1-2 invasive ductal carcinoma breast cancer.It measured 1.5 cm and is located in the upper outer quadrant. It is ER/PR positive and HER2 negative with a Ki67 of < 1%.  She had a left total knee  replacement in 10/18 with Dr. Wynelle Link and continues to have pain in her left lateral knee. Her orthopedist reported it is likely due to her iliotibial band. Patient reports she underwent a left lumpectomy and SNLB (0/4 nodes positive) removed on 04/17/18.She completed radiation, but no chemo    Patient Stated Goals to get rid of the left armpit and arm pain    Currently in Pain? Yes    Pain Score 7     Pain Location Breast    Pain Orientation Left    Pain Descriptors / Indicators Aching    Pain Type Chronic pain    Pain Radiating Towards breast and lateral chest    Pain Onset More than a month ago    Pain Frequency Intermittent    Aggravating Factors  reaching up in the cabinet    Pain Relieving Factors PT helps some                             [facility] Adult PT Treatment/Exercise - 03/11/21 0001      Exercises   Exercises Other Exercises    Other Exercises  deep breathing, rows with yellow theraback 5 times bilaterally, 5 times with each arm. right shoulder elevation x 10 reps followed by elevation of right arm that had no difference in pain , trunk rotation to each side      Manual Therapy   Manual Therapy Edema management;Soft tissue mobilization;Myofascial release;Manual Lymphatic Drainage (MLD)    Soft tissue mobilization Left pectoralis, border of axilla, left trunk area, left incisions and lats.    Myofascial Release left chest and lateral trunk prologed pressure with pac major tight area between thumb and finger    Manual Lymphatic Drainage (MLD) short neck, deep breaths, gentile stationary circles at supraclavicular areas , across chest left to right and along left lateral trunk                    PT Short Term Goals - 02/17/21 1919      PT SHORT TERM GOAL #1   Title Pt will report that she has had a reduction of discomfort in her left axilla and lateral chest to 5/10    Baseline 6/10    Time 3    Period Weeks    Status New      PT SHORT TERM  GOAL #2   Title Pt will report she is doing a beginning home exericse program for postural and strength exercises    Time 3    Period Weeks    Status New             PT Long Term Goals - 02/17/21  1921      PT LONG TERM GOAL #1   Title Pt will have painfree left shoulder abduction of 145 degrees so that she can reach into a kitchen cabinet at home    Baseline 134 degrees with 6/10 pain on 02/17/2021    Time 6    Period Weeks    Status New      PT LONG TERM GOAL #2   Title Pt will be independent in progressive resistive home exercise program for strength and fitness    Time 6    Period Weeks    Status New      PT LONG TERM GOAL #3   Title Pt will report that her overall pain and discomfort is decreased by 50%    Time 6    Period Weeks    Status New                 Plan - 03/11/21 1601    Clinical Impression Statement Pt has tight trigger points in posterior shoulder that improved with soft tissue work. She continues to have tightness in back and pain in both legs.  She felt better at end of session today    Personal Factors and Comorbidities Comorbidity 3+    Comorbidities Previous Lt TKR with persistent pain, Previous Lt breast lumpectomy and radiation, right sciatic pain    Stability/Clinical Decision Making Stable/Uncomplicated    Clinical Impairments Affecting Rehab Potential pain in left leg, pain in right leg, decreased tolerance of gait due to pain,    PT Frequency 2x / week    PT Duration 6 weeks    PT Treatment/Interventions ADLs/Self Care Home Management;Therapeutic activities;Therapeutic exercise;Patient/family education;Manual techniques;Manual lymph drainage;Scar mobilization;Passive range of motion;Moist Heat;Balance training;Functional mobility training;Orthotic Fit/Training;Taping;Compression bandaging    PT Next Visit Plan exericises for postural retraining and strengthening manual work for soft tissue mobilizaion and myofascial release to tight tissue  on left chest,  talk to [place] how to coordinate visits    Consulted and Agree with Plan of Care Patient           Patient will benefit from skilled therapeutic intervention in order to improve the following deficits and impairments:  Decreased knowledge of precautions,Impaired UE functional use,Decreased range of motion,Postural dysfunction,Decreased scar mobility,Decreased mobility,Pain,Increased fascial restricitons,Increased edema,Abnormal gait,Difficulty walking,Decreased endurance,Decreased activity tolerance,Impaired perceived functional ability,Decreased knowledge of use of DME,Decreased strength  Visit Diagnosis: Chronic left shoulder pain  Disorder of the skin and subcutaneous tissue related to radiation, unspecified  Muscle weakness (generalized)     Problem List Patient Active Problem List   Diagnosis Date Noted  . Breast mass, right 03/27/2020  . Hepatic steatosis 01/22/2020  . Genetic testing 04/02/2018  . Family history of breast cancer   . Malignant neoplasm of upper-outer quadrant of left breast in female, estrogen receptor positive ([place]) 03/16/2018  . OA (osteoarthritis) of knee 09/11/2017  . Infrapatellar bursitis of right knee 04/29/2014  . Pneumonia, aspiration ([place]) 12/28/2012  . HYPOTHYROIDISM 11/15/2010  . HYPERLIPIDEMIA 11/15/2010  . DEPRESSION 11/15/2010  . GERD 11/15/2010  . CONSTIPATION 11/15/2010  . GALLSTONES 11/15/2010  . RENAL CYST 11/15/2010  . INSOMNIA UNSPECIFIED 11/15/2010  . COUGH 11/15/2010  . ABDOMINAL PAIN OTHER SPECIFIED SITE 11/15/2010  . TRANSAMINASES, SERUM, ELEVATED 11/15/2010   Donato Heinz. Owens Shark PT  Norwood Levo 03/11/2021, 4:04 PM  [place], [place], [address] Phone: [phone]   Fax:  [phone]  Name: Monta  [name] MRN: [id number] Date of Birth: Jan 07, 1946

## 2021-03-11 NOTE — Telephone Encounter (Signed)
Scheduled per 3/29 los. Called pt and left a msg

## 2021-03-16 ENCOUNTER — Encounter: Payer: Self-pay | Admitting: Physical Therapy

## 2021-03-16 ENCOUNTER — Other Ambulatory Visit: Payer: Self-pay

## 2021-03-16 ENCOUNTER — Ambulatory Visit: Payer: Medicare Other | Attending: General Surgery | Admitting: Physical Therapy

## 2021-03-16 DIAGNOSIS — L599 Disorder of the skin and subcutaneous tissue related to radiation, unspecified: Secondary | ICD-10-CM | POA: Insufficient documentation

## 2021-03-16 DIAGNOSIS — M6281 Muscle weakness (generalized): Secondary | ICD-10-CM | POA: Insufficient documentation

## 2021-03-16 DIAGNOSIS — G8929 Other chronic pain: Secondary | ICD-10-CM | POA: Insufficient documentation

## 2021-03-16 DIAGNOSIS — M25512 Pain in left shoulder: Secondary | ICD-10-CM | POA: Insufficient documentation

## 2021-03-16 NOTE — Therapy (Signed)
[place], [place], [address] Phone: [phone]   Fax:  [phone]  Physical Therapy Treatment  Patient Details  Name: [name] MRN: [id number] Date of Birth: 1946/06/11 Referring Provider (PT): Dr. Donne Hazel    Encounter Date: 03/16/2021   PT End of Session - 03/16/21 1701    Visit Number 6    Number of Visits 13    Date for PT Re-Evaluation 03/31/21    PT Start Time 1500    PT Stop Time 1545    PT Time Calculation (min) 45 min    Activity Tolerance Patient tolerated treatment well    Behavior During Therapy [facility] for tasks assessed/performed           Past Medical History:  Diagnosis Date  . Arthritis    hands and knees  . Complication of anesthesia    aspiration pna; following a colonoscopy, pt requests head of bed elevated if possible.  . Constipation   . Cough   . Depression   . Dysrhythmia    RBBB on 06-06-17 ekg   . Elevated liver function tests   . Esophageal stricture   . Family history of breast cancer   . Gallstones   . Genetic testing 04/02/2018   STAT Breast panel with reflex to Multi-Cancer panel (83 genes) @ [facility] - No pathogenic mutations detected  . GERD (gastroesophageal reflux disease)   . Hiatal hernia   . History of kidney stones   . History of radiation therapy 07/09/2018- 08/03/18   Left Breast/ 40.05 Gy in 15 fractions. Left Breast boost 10 Gy in 5 fractions.   . Hyperlipemia   . Hypothyroidism   . Insomnia   . Malignant neoplasm of upper-outer quadrant of left female breast ([place])   . Nephrolithiasis   . Pneumonia 2015   aspirated after colonosopy  . Renal cyst     Past Surgical History:  Procedure Laterality Date  . BREAST BIOPSY Left 05/31/2019   mri  . BREAST BIOPSY Left 04/2019   malignant  . BREAST LUMPECTOMY Left 04/17/2018   malignant  . BREAST LUMPECTOMY WITH RADIOACTIVE SEED AND SENTINEL LYMPH NODE BIOPSY Left 04/17/2018   Procedure:  LEFT BREAST LUMPECTOMY WITH BRACKETED RADIOACTIVE SEEDS AND SENTINEL LYMPH NODE BIOPSY;  Surgeon: Rolm Bookbinder, MD;  Location: [facility];  Service: General;  Laterality: Left;  . BREAST SURGERY Left    Lumpectomy  . CARDIOVASCULAR STRESS TEST  07/19/2006   EF 70%, NO EVIDENCE OF ISCHEMIA  . CHOLECYSTECTOMY  12/30/10  . COLONOSCOPY    . DILATION AND CURETTAGE OF UTERUS    . HYSTEROSCOPY WITH D & C N/A 06/07/2018   Procedure: DILATATION AND CURETTAGE /HYSTEROSCOPY;  Surgeon: [name], MD;  Location: [facility];  Service: Gynecology;  Laterality: N/A;  . inguinal herniography    . NASAL SINUS SURGERY    . RADIOACTIVE SEED GUIDED EXCISIONAL BREAST BIOPSY Right 04/17/2018   Procedure: RIGHT BREAST SEED GUIDED EXCISIONAL BIOPSY;  Surgeon: Rolm Bookbinder, MD;  Location: [facility];  Service: General;  Laterality: Right;  . RE-EXCISION OF BREAST LUMPECTOMY Left 05/15/2018   Procedure: RE-EXCISION OF LEFT BREAST LUMPECTOMY, ASPIRATION OF LEFT AXILLARY SEROMA;  Surgeon: Rolm Bookbinder, MD;  Location: [facility];  Service: General;  Laterality: Left;  . T & A    . TOOTH EXTRACTION    . TOTAL KNEE ARTHROPLASTY Left 09/11/2017   Procedure: LEFT TOTAL KNEE ARTHROPLASTY;  Surgeon:  Gaynelle Arabian, MD;  Location: [facility];  Service: Orthopedics;  Laterality: Left;  with block  . US ECHOCARDIOGRAPHY  07/17/2006   EF 55-60%    There were no vitals filed for this visit.   Subjective Assessment - 03/16/21 1506    Subjective Pt did the stationary bike at [employer] on Saturday and was not able to walk the rest of the day.  She has been doing some gardening today.  She is having a little sciatic pain today but she is here to work on her left chest tightness    Pertinent History Patient was diagnosed on 03/08/18 with left grade 1-2 invasive ductal carcinoma breast cancer.It measured 1.5 cm and is located in the upper outer quadrant. It is ER/PR positive and  HER2 negative with a Ki67 of < 1%.  She had a left total knee replacement in 10/18 with Dr. Wynelle Link and continues to have pain in her left lateral knee. Her orthopedist reported it is likely due to her iliotibial band. Patient reports she underwent a left lumpectomy and SNLB (0/4 nodes positive) removed on 04/17/18.She completed radiation, but no chemo    Patient Stated Goals to get rid of the left armpit and arm pain    Currently in Pain? Yes    Pain Score 4     Pain Location Chest    Pain Orientation Left    Pain Descriptors / Indicators Aching    Pain Type Chronic pain    Pain Radiating Towards toward axilla    Pain Onset More than a month ago    Pain Onset More than a month ago                             [facility] Adult PT Treatment/Exercise - 03/16/21 0001      Exercises   Exercises Other Exercises;Shoulder    Other Exercises  deep breathing, neck flexion and active retraction, scapular circles for warm up      Shoulder Exercises: Supine   Protraction AROM;Right;Left;10 reps;Both    Horizontal ABduction AROM;Right;Left;Both;10 reps    Flexion AROM;Right;Left;Both;10 reps      Manual Therapy   Manual Therapy Edema management;Soft tissue mobilization;Myofascial release;Manual Lymphatic Drainage (MLD)    Soft tissue mobilization Left pectoralis, border of axilla, left trunk area, left incisions and lats.    Myofascial Release left chest and lateral trunk prologed pressure with pac major tight area between thumb and finger able to get deeper to pec minor today , scar tissue felt deep on ribs below and between scars from lumpectomy and axillary node removal                    PT Short Term Goals - 02/17/21 1919      PT SHORT TERM GOAL #1   Title Pt will report that she has had a reduction of discomfort in her left axilla and lateral chest to 5/10    Baseline 6/10    Time 3    Period Weeks    Status New      PT SHORT TERM GOAL #2   Title Pt will report  she is doing a beginning home exericse program for postural and strength exercises    Time 3    Period Weeks    Status New             PT Long Term Goals - 02/17/21 1921  PT LONG TERM GOAL #1   Title Pt will have painfree left shoulder abduction of 145 degrees so that she can reach into a kitchen cabinet at home    Baseline 134 degrees with 6/10 pain on 02/17/2021    Time 6    Period Weeks    Status New      PT LONG TERM GOAL #2   Title Pt will be independent in progressive resistive home exercise program for strength and fitness    Time 6    Period Weeks    Status New      PT LONG TERM GOAL #3   Title Pt will report that her overall pain and discomfort is decreased by 50%    Time 6    Period Weeks    Status New                 Plan - 03/16/21 1702    Clinical Impression Statement Increased shoulder AROM today and continued with soft tissue work and myofascial release to left chest and around scars. Continued to emphasize postural work Pt reports feeling much better at end of session    Personal Factors and Comorbidities Comorbidity 3+    Stability/Clinical Decision Making Stable/Uncomplicated    Rehab Potential Good    Clinical Impairments Affecting Rehab Potential pain in left leg, pain in right leg, decreased tolerance of gait due to pain,    PT Duration 6 weeks    PT Treatment/Interventions ADLs/Self Care Home Management;Therapeutic activities;Therapeutic exercise;Patient/family education;Manual techniques;Manual lymph drainage;Scar mobilization;Passive range of motion;Moist Heat;Balance training;Functional mobility training;Orthotic Fit/Training;Taping;Compression bandaging    PT Next Visit Plan exericises for postural retraining and strengthening manual work for soft tissue mobilizaion and myofascial release to tight tissue on left chest,    Consulted and Agree with Plan of Care Patient           Patient will benefit from skilled therapeutic intervention  in order to improve the following deficits and impairments:  Decreased knowledge of precautions,Impaired UE functional use,Decreased range of motion,Postural dysfunction,Decreased scar mobility,Decreased mobility,Pain,Increased fascial restricitons,Increased edema,Abnormal gait,Difficulty walking,Decreased endurance,Decreased activity tolerance,Impaired perceived functional ability,Decreased knowledge of use of DME,Decreased strength  Visit Diagnosis: Chronic left shoulder pain  Disorder of the skin and subcutaneous tissue related to radiation, unspecified  Muscle weakness (generalized)     Problem List Patient Active Problem List   Diagnosis Date Noted  . Breast mass, right 03/27/2020  . Hepatic steatosis 01/22/2020  . Genetic testing 04/02/2018  . Family history of breast cancer   . Malignant neoplasm of upper-outer quadrant of left breast in female, estrogen receptor positive ([place]) 03/16/2018  . OA (osteoarthritis) of knee 09/11/2017  . Infrapatellar bursitis of right knee 04/29/2014  . Pneumonia, aspiration ([place]) 12/28/2012  . HYPOTHYROIDISM 11/15/2010  . HYPERLIPIDEMIA 11/15/2010  . DEPRESSION 11/15/2010  . GERD 11/15/2010  . CONSTIPATION 11/15/2010  . GALLSTONES 11/15/2010  . RENAL CYST 11/15/2010  . INSOMNIA UNSPECIFIED 11/15/2010  . COUGH 11/15/2010  . ABDOMINAL PAIN OTHER SPECIFIED SITE 11/15/2010  . TRANSAMINASES, SERUM, ELEVATED 11/15/2010   Donato Heinz. Owens Shark PT  Norwood Levo 03/16/2021, 5:05 PM  [place], [place], [address] Phone: [phone]   Fax:  [phone]  Name: [name] MRN: [id number] Date of Birth: 11-02-1946

## 2021-03-17 ENCOUNTER — Encounter: Payer: Self-pay | Admitting: Physical Therapy

## 2021-03-17 ENCOUNTER — Ambulatory Visit: Payer: Medicare Other | Admitting: Physical Therapy

## 2021-03-17 DIAGNOSIS — M25512 Pain in left shoulder: Secondary | ICD-10-CM

## 2021-03-17 DIAGNOSIS — L599 Disorder of the skin and subcutaneous tissue related to radiation, unspecified: Secondary | ICD-10-CM | POA: Diagnosis not present

## 2021-03-17 DIAGNOSIS — G8929 Other chronic pain: Secondary | ICD-10-CM | POA: Diagnosis not present

## 2021-03-17 DIAGNOSIS — M6281 Muscle weakness (generalized): Secondary | ICD-10-CM

## 2021-03-18 ENCOUNTER — Encounter: Payer: Self-pay | Admitting: Physical Therapy

## 2021-03-18 ENCOUNTER — Ambulatory Visit: Payer: Medicare Other | Admitting: Physical Therapy

## 2021-03-18 ENCOUNTER — Other Ambulatory Visit: Payer: Self-pay

## 2021-03-18 DIAGNOSIS — M25512 Pain in left shoulder: Secondary | ICD-10-CM

## 2021-03-18 DIAGNOSIS — M6281 Muscle weakness (generalized): Secondary | ICD-10-CM | POA: Diagnosis not present

## 2021-03-18 DIAGNOSIS — G8929 Other chronic pain: Secondary | ICD-10-CM | POA: Diagnosis not present

## 2021-03-18 DIAGNOSIS — L599 Disorder of the skin and subcutaneous tissue related to radiation, unspecified: Secondary | ICD-10-CM

## 2021-03-18 NOTE — Therapy (Signed)
[place], [place], [address] Phone: [phone]   Fax:  [phone]  Physical Therapy Treatment  Patient Details  Name: [name] MRN: [id number] Date of Birth: [birth year] Referring Provider (PT): Dr Shon Baton   Encounter Date: 03/18/2021   PT End of Session - 03/18/21 1732    Visit Number 7    Number of Visits 13    Date for PT Re-Evaluation 03/31/21    PT Start Time 1500    PT Stop Time 1545    PT Time Calculation (min) 45 min    Activity Tolerance Patient tolerated treatment well    Behavior During Therapy [facility] for tasks assessed/performed           Past Medical History:  Diagnosis Date  . Arthritis    hands and knees  . Complication of anesthesia    aspiration pna; following a colonoscopy, pt requests head of bed elevated if possible.  . Constipation   . Cough   . Depression   . Dysrhythmia    RBBB on 06-06-17 ekg   . Elevated liver function tests   . Esophageal stricture   . Family history of breast cancer   . Gallstones   . Genetic testing 04/02/2018   STAT Breast panel with reflex to Multi-Cancer panel (83 genes) @ [facility] - No pathogenic mutations detected  . GERD (gastroesophageal reflux disease)   . Hiatal hernia   . History of kidney stones   . History of radiation therapy 07/09/2018- 08/03/18   Left Breast/ 40.05 Gy in 15 fractions. Left Breast boost 10 Gy in 5 fractions.   . Hyperlipemia   . Hypothyroidism   . Insomnia   . Malignant neoplasm of upper-outer quadrant of left female breast ([place])   . Nephrolithiasis   . Pneumonia 2015   aspirated after colonosopy  . Renal cyst     Past Surgical History:  Procedure Laterality Date  . BREAST BIOPSY Left 05/31/2019   mri  . BREAST BIOPSY Left 04/2019   malignant  . BREAST LUMPECTOMY Left 04/17/2018   malignant  . BREAST LUMPECTOMY WITH RADIOACTIVE SEED AND SENTINEL LYMPH NODE BIOPSY Left 04/17/2018   Procedure:  LEFT BREAST LUMPECTOMY WITH BRACKETED RADIOACTIVE SEEDS AND SENTINEL LYMPH NODE BIOPSY;  Surgeon: Rolm Bookbinder, MD;  Location: [facility];  Service: General;  Laterality: Left;  . BREAST SURGERY Left    Lumpectomy  . CARDIOVASCULAR STRESS TEST  07/19/2006   EF 70%, NO EVIDENCE OF ISCHEMIA  . CHOLECYSTECTOMY  12/30/10  . COLONOSCOPY    . DILATION AND CURETTAGE OF UTERUS    . HYSTEROSCOPY WITH D & C N/A 06/07/2018   Procedure: DILATATION AND CURETTAGE /HYSTEROSCOPY;  Surgeon: [name], MD;  Location: [facility];  Service: Gynecology;  Laterality: N/A;  . inguinal herniography    . NASAL SINUS SURGERY    . RADIOACTIVE SEED GUIDED EXCISIONAL BREAST BIOPSY Right 04/17/2018   Procedure: RIGHT BREAST SEED GUIDED EXCISIONAL BIOPSY;  Surgeon: Rolm Bookbinder, MD;  Location: [facility];  Service: General;  Laterality: Right;  . RE-EXCISION OF BREAST LUMPECTOMY Left 05/15/2018   Procedure: RE-EXCISION OF LEFT BREAST LUMPECTOMY, ASPIRATION OF LEFT AXILLARY SEROMA;  Surgeon: Rolm Bookbinder, MD;  Location: [facility];  Service: General;  Laterality: Left;  . T & A    . TOOTH EXTRACTION    . TOTAL KNEE ARTHROPLASTY Left 09/11/2017   Procedure: LEFT TOTAL KNEE ARTHROPLASTY;  Surgeon:  Ollen Gross, MD;  [facility];  Service: Orthopedics;  Laterality: Left;  with block  . US ECHOCARDIOGRAPHY  07/17/2006   EF 55-60%    There were no vitals filed for this visit.   Subjective Assessment - 03/18/21 1726    Subjective Pt comes in using a straight cane and reports that she thinks it help lessen the pain down her right leg.  She is still sore in her left chest, but it is not as bad as it once was    Pertinent History Patient was diagnosed on 03/08/18 with left grade 1-2 invasive ductal carcinoma breast cancer.It measured 1.5 cm and is located in the upper outer quadrant. It is ER/PR positive and HER2 negative with a Ki67 of < 1%.  She had a left total knee  replacement in 10/18 with Dr. Lequita Halt and continues to have pain in her left lateral knee. Her orthopedist reported it is likely due to her iliotibial band. Patient reports she underwent a left lumpectomy and SNLB (0/4 nodes positive) removed on 04/17/18.She completed radiation, but no chemo    Currently in Pain? Yes    Pain Score --   did not rate                            [facility] Adult PT Treatment/Exercise - 03/18/21 1701      Exercises   Exercises Other Exercises    Other Exercises  deep breathing, neck flexion and active retraction, scapular circles for warm up      Shoulder Exercises: Supine   Horizontal ABduction Strengthening;Left;5 reps    Theraband Level (Shoulder Horizontal ABduction) Level 1 (Yellow)      Shoulder Exercises: Seated   Flexion AROM;Right;Left;Both;5 reps      Shoulder Exercises: Standing   Row Strengthening;Both;15 reps;Theraband    Theraband Level (Shoulder Row) Level 1 (Yellow)    Row Limitations 3 sets of 5 with rest in between      Manual Therapy   Manual Therapy Soft tissue mobilization;Myofascial release    Soft tissue mobilization Left pectoralis, border of axilla, left trunk area, left incisions and lats.    Myofascial Release left chest and lateral trunk prologed pressure with pac major tight area between thumb and finger able to get deeper to pec minor today , scar tissue felt deep on ribs below and between scars from lumpectomy and axillary node removal                    PT Short Term Goals - 03/18/21 4183      PT SHORT TERM GOAL #1   Title Pt will report that she has had a reduction of discomfort in her left axilla and lateral chest to 5/10    Baseline 6/10    Time 3    Period Weeks    Status New      PT SHORT TERM GOAL #2   Title Pt will report she is doing a beginning home exericse program for postural and strength exercises    Time 3    Period Weeks    Status New      PT SHORT TERM GOAL #3   Title  Pateitn will improve left single leg stance time to 10 sec with min UE assist    Time 3    Period Weeks    Status New    Target Date 04/08/21      PT SHORT  TERM GOAL #4   Title Patiewnt will increase gross bilateral LE strength to 4+/5    Time 3    Period Weeks    Status New    Target Date 04/08/21      PT SHORT TERM GOAL #5   Title Patient will be independent with basic HEP for Left LE strengthening and right hip stretching and strengthening    Time 3    Period Weeks    Status New    Target Date 04/08/21      PT SHORT TERM GOAL #6   Title Therapy will review FOTO score    Time 3    Period Weeks    Status New    Target Date 04/08/21             PT Long Term Goals - 03/18/21 0829      PT LONG TERM GOAL #1   Title Pt will have painfree left shoulder abduction of 145 degrees so that she can reach into a kitchen cabinet at home    Baseline 134 degrees with 6/10 pain on 02/17/2021    Time 6    Period Weeks    Status New      PT LONG TERM GOAL #2   Title Pt will be independent in progressive resistive home exercise program for strength and fitness    Time 6    Period Weeks    Status New      PT LONG TERM GOAL #3   Title Pt will report that her overall pain and discomfort is decreased by 50%    Time 6    Period Weeks    Status New      PT LONG TERM GOAL #4   Title Patient will walk 3000' with LRAD without increased pain    Time 6    Period Weeks    Status New    Target Date 04/29/21      PT LONG TERM GOAL #5   Title Patient will report <3/10 pain with ADL's in her left knee and right hip    Time 6    Period Weeks    Status New    Target Date 04/29/21                 Plan - 03/18/21 1733    Clinical Impression Statement Pt is demonstrating better active movment with less pain in her upper extremity movement.  She was instructed to take a second when she stands up to organize her body to adjust her posture so she is not so kyphotic and place her cane  in best postition to help decrease her right leg pain before she starts walking.  She reports she gets good relief from chest wall pain after soft tissue work    Personal Factors and Comorbidities Comorbidity 3+    Comorbidities Previous Lt TKR with persistent pain, Previous Lt breast lumpectomy and radiation, right sciatic pain    Stability/Clinical Decision Making Evolving/Moderate complexity    Rehab Potential Good    Clinical Impairments Affecting Rehab Potential pain in left leg, pain in right leg, decreased tolerance of gait due to pain,    PT Frequency 2x / week    PT Duration 6 weeks    PT Treatment/Interventions ADLs/Self Care Home Management;Therapeutic activities;Therapeutic exercise;Patient/family education;Manual techniques;Manual lymph drainage;Scar mobilization;Passive range of motion;Moist Heat;Balance training;Functional mobility training;Orthotic Fit/Training;Taping;Compression bandaging;Aquatic Therapy;Cryotherapy;Neuromuscular re-education;Dry needling    PT Next Visit Plan exericises for postural retraining and strengthening  manual work for soft tissue mobilizaion and myofascial release to tight tissue on left chest, ...........(ortho) continue with manual therapy to right hip. Progress strengthening of bilateral LE; progress to standing exercises as tolerated.    Consulted and Agree with Plan of Care Patient           Patient will benefit from skilled therapeutic intervention in order to improve the following deficits and impairments:  Decreased knowledge of precautions,Impaired UE functional use,Decreased range of motion,Postural dysfunction,Decreased scar mobility,Decreased mobility,Pain,Increased fascial restricitons,Increased edema,Abnormal gait,Difficulty walking,Decreased endurance,Decreased activity tolerance,Impaired perceived functional ability,Decreased knowledge of use of DME,Decreased strength  Visit Diagnosis: Chronic left shoulder pain  Disorder of the skin  and subcutaneous tissue related to radiation, unspecified  Muscle weakness (generalized)     Problem List Patient Active Problem List   Diagnosis Date Noted  . Breast mass, right 03/27/2020  . Hepatic steatosis 01/22/2020  . Genetic testing 04/02/2018  . Family history of breast cancer   . Malignant neoplasm of upper-outer quadrant of left breast in female, estrogen receptor positive ([place]) 03/16/2018  . OA (osteoarthritis) of knee 09/11/2017  . Infrapatellar bursitis of right knee 04/29/2014  . Pneumonia, aspiration ([place]) 12/28/2012  . HYPOTHYROIDISM 11/15/2010  . HYPERLIPIDEMIA 11/15/2010  . DEPRESSION 11/15/2010  . GERD 11/15/2010  . CONSTIPATION 11/15/2010  . GALLSTONES 11/15/2010  . RENAL CYST 11/15/2010  . INSOMNIA UNSPECIFIED 11/15/2010  . COUGH 11/15/2010  . ABDOMINAL PAIN OTHER SPECIFIED SITE 11/15/2010  . TRANSAMINASES, SERUM, ELEVATED 11/15/2010   Donato Heinz. Owens Shark PT  Norwood Levo 03/18/2021, 5:37 PM  [place], [place], [address] Phone: [phone]   Fax:  [phone]  Name: TAMAIYA BUMP MRN: [id number] Date of Birth: April 09, 1946

## 2021-03-18 NOTE — Addendum Note (Signed)
Addended by: Carney Living on: 03/18/2021 01:03 PM   Modules accepted: Orders

## 2021-03-18 NOTE — Therapy (Signed)
[place], [place], [address] Phone: [phone]   Fax:  [phone]  Physical Therapy Treatment  Patient Details  Name: [name] MRN: [id number] Date of Birth: 1946-04-06 Referring Provider (PT): Dr Shon Baton   Encounter Date: 03/17/2021   PT End of Session - 03/17/21 1616    Visit Number 6   1st visit for low back   Number of Visits 13   12   Date for PT Re-Evaluation 03/31/21   04/28/2021 for lower back   Authorization - Visit Number 1    Authorization - Number of Visits 12    Progress Note Due on Visit 0    PT Start Time 0215    PT Stop Time 0315    PT Time Calculation (min) 60 min    Activity Tolerance Patient tolerated treatment well    Behavior During Therapy [facility] for tasks assessed/performed           Past Medical History:  Diagnosis Date  . Arthritis    hands and knees  . Complication of anesthesia    aspiration pna; following a colonoscopy, pt requests head of bed elevated if possible.  . Constipation   . Cough   . Depression   . Dysrhythmia    RBBB on 06-06-17 ekg   . Elevated liver function tests   . Esophageal stricture   . Family history of breast cancer   . Gallstones   . Genetic testing 04/02/2018   STAT Breast panel with reflex to Multi-Cancer panel (83 genes) @ [facility] - No pathogenic mutations detected  . GERD (gastroesophageal reflux disease)   . Hiatal hernia   . History of kidney stones   . History of radiation therapy 07/09/2018- 08/03/18   Left Breast/ 40.05 Gy in 15 fractions. Left Breast boost 10 Gy in 5 fractions.   . Hyperlipemia   . Hypothyroidism   . Insomnia   . Malignant neoplasm of upper-outer quadrant of left female breast ([place])   . Nephrolithiasis   . Pneumonia 2015   aspirated after colonosopy  . Renal cyst     Past Surgical History:  Procedure Laterality Date  . BREAST BIOPSY Left 05/31/2019   mri  . BREAST BIOPSY Left 04/2019    malignant  . BREAST LUMPECTOMY Left 04/17/2018   malignant  . BREAST LUMPECTOMY WITH RADIOACTIVE SEED AND SENTINEL LYMPH NODE BIOPSY Left 04/17/2018   Procedure: LEFT BREAST LUMPECTOMY WITH BRACKETED RADIOACTIVE SEEDS AND SENTINEL LYMPH NODE BIOPSY;  Surgeon: Rolm Bookbinder, MD;  Location: [place];  Service: General;  Laterality: Left;  . BREAST SURGERY Left    Lumpectomy  . CARDIOVASCULAR STRESS TEST  07/19/2006   EF 70%, NO EVIDENCE OF ISCHEMIA  . CHOLECYSTECTOMY  12/30/10  . COLONOSCOPY    . DILATION AND CURETTAGE OF UTERUS    . HYSTEROSCOPY WITH D & C N/A 06/07/2018   Procedure: DILATATION AND CURETTAGE /HYSTEROSCOPY;  Surgeon: [name], MD;  Location: [facility];  Service: Gynecology;  Laterality: N/A;  . inguinal herniography    . NASAL SINUS SURGERY    . RADIOACTIVE SEED GUIDED EXCISIONAL BREAST BIOPSY Right 04/17/2018   Procedure: RIGHT BREAST SEED GUIDED EXCISIONAL BIOPSY;  Surgeon: Rolm Bookbinder, MD;  Location: [place];  Service: General;  Laterality: Right;  . RE-EXCISION OF BREAST LUMPECTOMY Left 05/15/2018   Procedure: RE-EXCISION OF LEFT BREAST LUMPECTOMY, ASPIRATION OF LEFT AXILLARY SEROMA;  Surgeon: Rolm Bookbinder, MD;  Location:  [place];  Service: General;  Laterality: Left;  . T & A    . TOOTH EXTRACTION    . TOTAL KNEE ARTHROPLASTY Left 09/11/2017   Procedure: LEFT TOTAL KNEE ARTHROPLASTY;  Surgeon: Gaynelle Arabian, MD;  Location: [facility];  Service: Orthopedics;  Laterality: Left;  with block  . US ECHOCARDIOGRAPHY  07/17/2006   EF 55-60%    There were no vitals filed for this visit.   Subjective Assessment - 03/17/21 1438    Subjective Patient has a hisotry of right sided low back pain that has increased over the past year. The pain radiates down into her right calf. She had a left knee replacement with subsequent debridement but continues to have pain in her left leg. The pain can be worse in either depending  on the day.    Pertinent History Patient was diagnosed on 03/08/18 with left grade 1-2 invasive ductal carcinoma breast cancer.It measured 1.5 cm and is located in the upper outer quadrant. It is ER/PR positive and HER2 negative with a Ki67 of < 1%.  She had a left total knee replacement in 10/18 with Dr. Wynelle Link and continues to have pain in her left lateral knee. Her orthopedist reported it is likely due to her iliotibial band. Patient reports she underwent a left lumpectomy and SNLB (0/4 nodes positive) removed on 04/17/18.She completed radiation, but no chemo    Limitations Standing;Walking    How long can you stand comfortably? limited time before she has pain    How long can you walk comfortably? depends on the day    Diagnostic tests MRI of lumbar spine: L4 disc buldge with possible L5 nerve root impingement    Patient Stated Goals to get rid of the left armpit and arm pain    Currently in Pain? Yes    Pain Score 6     Pain Location Back    Pain Orientation Right    Pain Descriptors / Indicators Aching    Pain Type Chronic pain    Pain Radiating Towards radiating into the left LE    Pain Onset More than a month ago    Pain Frequency Constant    Aggravating Factors  standing and walking    Pain Relieving Factors rest    Effect of Pain on Daily Activities difficulty perfroming ADL's    Multiple Pain Sites Yes    Pain Score 8    Pain Location Knee    Pain Orientation Left    Pain Descriptors / Indicators Aching    Pain Type Chronic pain    Pain Onset More than a month ago    Pain Frequency Constant    Aggravating Factors  standing and walking              [facility] PT Assessment - 03/18/21 0001      Assessment   Medical Diagnosis Right Low Back Pain/ Left Knee Pain    Referring Provider (PT) Dr Shon Baton    Onset Date/Surgical Date --   >1 year ago     Balance Screen   Has the patient fallen in the past 6 months No    Has the patient had a decrease in activity level because  of a fear of falling?  No    Is the patient reluctant to leave their home because of a fear of falling?  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 it opens in a few months      Cognition   Overall Cognitive Status Within Functional Limits for tasks assessed      Observation/Other Assessments   Observations visible kyphotic, foward head posture, appears to be deconditioned with dry skin and muscle atrophy      Sensation   Light Touch Not tested      Coordination   Gross Motor Movements are Fluid and Coordinated No      Posture/Postural Control   Posture/Postural Control Postural limitations    Postural Limitations Rounded Shoulders;Forward head;Increased thoracic kyphosis      AROM   Overall AROM Comments pain with end range right hip flexion and end range IR; Pain with end range left knee flexion      Strength   Strength Assessment Site Hip;Knee    Right/Left Hip Right;Left    Right Hip Flexion 3+/5    Right Hip ABduction 3+/5    Right Hip ADduction 4+/5    Left Hip Flexion 4/5    Left Hip ABduction 4/5    Left Hip ADduction 4+/5    Right/Left Knee Right;Left    Right Knee Flexion 5/5    Right Knee Extension 5/5    Left Knee Flexion 4/5    Left Knee Extension 4/5      Palpation   Palpation comment significant tenderness to palpation in the medial tibial area left; signifcant spasming in the right piriformis and into lumbar paraspinal/quadratus area.      Transfers   Comments shifts over to the right      Ambulation/Gait   Gait Comments decreased single leg stance on the left; significant shift off the left leg onto the right.                         [facility] PT Treatment/Exercise - 03/18/21 0001      Knee/Hip Exercises: Stretches   Other  Knee/Hip Stretches piriformis stretch 3x20 sec hold    Other Knee/Hip Stretches hamstring stretch seated 3x20 sec hold      Knee/Hip Exercises: Supine   Quad Sets Limitations x10 5 sec hold    Other Supine Knee/Hip Exercises SLR 3x5      Manual Therapy   Manual Therapy Manual Traction    Manual Traction gentle grade I and II occialtions to reduce inflamation                  PT Education - 03/17/21 1615    Education Details use of cane; long term low level strengthening of the knee; benefits of aquatic therapy    Person(s) Educated Patient    Methods Tactile cues;Verbal cues;Demonstration;Explanation    Comprehension Returned demonstration;Verbal cues required;Tactile cues required;Verbalized understanding            PT Short Term Goals - 03/18/21 3818      PT SHORT TERM GOAL #1   Title Pt will report that she has had a reduction of discomfort in her left axilla and lateral chest to 5/10    Baseline 6/10    Time 3    Period Weeks    Status New      PT SHORT TERM GOAL #2   Title Pt will report she is doing  a beginning home exericse program for postural and strength exercises    Time 3    Period Weeks    Status New      PT SHORT TERM GOAL #3   Title Pateitn will improve left single leg stance time to 10 sec with min UE assist    Time 3    Period Weeks    Status New    Target Date 04/08/21      PT SHORT TERM GOAL #4   Title Patiewnt will increase gross bilateral LE strength to 4+/5    Time 3    Period Weeks    Status New    Target Date 04/08/21      PT SHORT TERM GOAL #5   Title Patient will be independent with basic HEP for Left LE strengthening and right hip stretching and strengthening    Time 3    Period Weeks    Status New    Target Date 04/08/21      PT SHORT TERM GOAL #6   Title Therapy will review FOTO score    Time 3    Period Weeks    Status New    Target Date 04/08/21             PT Long Term Goals - 03/18/21 0829      PT LONG  TERM GOAL #1   Title Pt will have painfree left shoulder abduction of 145 degrees so that she can reach into a kitchen cabinet at home    Baseline 134 degrees with 6/10 pain on 02/17/2021    Time 6    Period Weeks    Status New      PT LONG TERM GOAL #2   Title Pt will be independent in progressive resistive home exercise program for strength and fitness    Time 6    Period Weeks    Status New      PT LONG TERM GOAL #3   Title Pt will report that her overall pain and discomfort is decreased by 50%    Time 6    Period Weeks    Status New      PT LONG TERM GOAL #4   Title Patient will walk 3000' with LRAD without increased pain    Time 6    Period Weeks    Status New    Target Date 04/29/21      PT LONG TERM GOAL #5   Title Patient will report <3/10 pain with ADL's in her left knee and right hip    Time 6    Period Weeks    Status New    Target Date 04/29/21                 Plan - 03/17/21 1630    Clinical Impression Statement Patient is a 75 year old female who presents with right side lower back pain with sciatica. Her pain raidiates int her right posteriro calf. She has significant longstanding left leg pain. She has decreased ability to maintain left single leg stance which is likley exacebrating her right hip and low back symptoms. She has signifcant spasiming in her right gluteal and into her lumbar spine. She was advised she may benefit from use of a cane. She was given education on fitting of a cane and use of the cane. She has fair maovement in her hip and some limitation in lumbar flexion. She has tenderness to palpation in the medial inferior  portion of her knee. She was given basic knee strengthening exercises to work on. She would benefit from skilled therapy to improve left knee stability; improve spasming on the right lumbar/hip area; and improve her ability to perfrom ADL's. She would benefit from pool therapy.    Personal Factors and Comorbidities Comorbidity  3+    Comorbidities Previous Lt TKR with persistent pain, Previous Lt breast lumpectomy and radiation, right sciatic pain    Examination-Activity Limitations Carry;Lift    Stability/Clinical Decision Making Evolving/Moderate complexity   sciatica pain that is progressively decreasing her ability to perfrom daily tasks   Clinical Decision Making Moderate    Rehab Potential Good    Clinical Impairments Affecting Rehab Potential pain in left leg, pain in right leg, decreased tolerance of gait due to pain,    PT Treatment/Interventions ADLs/Self Care Home Management;Therapeutic activities;Therapeutic exercise;Patient/family education;Manual techniques;Manual lymph drainage;Scar mobilization;Passive range of motion;Moist Heat;Balance training;Functional mobility training;Orthotic Fit/Training;Taping;Compression bandaging;Aquatic Therapy;Cryotherapy;Neuromuscular re-education;Dry needling   aquatic therapy   PT Next Visit Plan exericises for postural retraining and strengthening manual work for soft tissue mobilizaion and myofascial release to tight tissue on left chest, ...........(ortho) continue with manual therapy to right hip. Progress strengthening of bilateral LE; progress to standing exercises as tolerated.    PT Home Exercise Plan quad set, SLR,  prifriformis stretch; hamstring stretch;    Consulted and Agree with Plan of Care Patient           Patient will benefit from skilled therapeutic intervention in order to improve the following deficits and impairments:  Decreased knowledge of precautions,Impaired UE functional use,Decreased range of motion,Postural dysfunction,Decreased scar mobility,Decreased mobility,Pain,Increased fascial restricitons,Increased edema,Abnormal gait,Difficulty walking,Decreased endurance,Decreased activity tolerance,Impaired perceived functional ability,Decreased knowledge of use of DME,Decreased strength  Visit Diagnosis: Chronic left shoulder pain  Disorder of the  skin and subcutaneous tissue related to radiation, unspecified  Muscle weakness (generalized)     Problem List Patient Active Problem List   Diagnosis Date Noted  . Breast mass, right 03/27/2020  . Hepatic steatosis 01/22/2020  . Genetic testing 04/02/2018  . Family history of breast cancer   . Malignant neoplasm of upper-outer quadrant of left breast in female, estrogen receptor positive ([place]) 03/16/2018  . OA (osteoarthritis) of knee 09/11/2017  . Infrapatellar bursitis of right knee 04/29/2014  . Pneumonia, aspiration ([place]) 12/28/2012  . HYPOTHYROIDISM 11/15/2010  . HYPERLIPIDEMIA 11/15/2010  . DEPRESSION 11/15/2010  . GERD 11/15/2010  . CONSTIPATION 11/15/2010  . GALLSTONES 11/15/2010  . RENAL CYST 11/15/2010  . INSOMNIA UNSPECIFIED 11/15/2010  . COUGH 11/15/2010  . ABDOMINAL PAIN OTHER SPECIFIED SITE 11/15/2010  . TRANSAMINASES, SERUM, ELEVATED 11/15/2010    Carney Living  PT DPT  03/18/2021, 12:57 PM  [facility] [address] [place], [place], [address] Phone: [phone]   Fax:  [phone]  Name: [name] MRN: [id number] Date of Birth: 1946/10/03

## 2021-03-23 ENCOUNTER — Encounter: Payer: Medicare Other | Admitting: Physical Therapy

## 2021-03-25 ENCOUNTER — Ambulatory Visit: Payer: Medicare Other | Admitting: Physical Therapy

## 2021-03-25 ENCOUNTER — Other Ambulatory Visit: Payer: Self-pay

## 2021-03-25 ENCOUNTER — Encounter: Payer: Self-pay | Admitting: Physical Therapy

## 2021-03-25 DIAGNOSIS — G8929 Other chronic pain: Secondary | ICD-10-CM | POA: Diagnosis not present

## 2021-03-25 DIAGNOSIS — L599 Disorder of the skin and subcutaneous tissue related to radiation, unspecified: Secondary | ICD-10-CM | POA: Diagnosis not present

## 2021-03-25 DIAGNOSIS — M6281 Muscle weakness (generalized): Secondary | ICD-10-CM | POA: Diagnosis not present

## 2021-03-25 DIAGNOSIS — M25512 Pain in left shoulder: Secondary | ICD-10-CM

## 2021-03-25 NOTE — Therapy (Signed)
[place], [place], [address] Phone: [phone]   Fax:  [phone]  Physical Therapy Treatment  Patient Details  Name: [name] MRN: [id number] Date of Birth: 07-Jun-1946 Referring Provider (PT): Dr Shon Baton   Encounter Date: 03/25/2021   PT End of Session - 03/25/21 1557    Visit Number 8    Number of Visits 13    Date for PT Re-Evaluation 03/31/21    PT Start Time 1500    PT Stop Time 1555    PT Time Calculation (min) 55 min    Activity Tolerance Patient tolerated treatment well    Behavior During Therapy [facility] for tasks assessed/performed           Past Medical History:  Diagnosis Date  . Arthritis    hands and knees  . Complication of anesthesia    aspiration pna; following a colonoscopy, pt requests head of bed elevated if possible.  . Constipation   . Cough   . Depression   . Dysrhythmia    RBBB on 06-06-17 ekg   . Elevated liver function tests   . Esophageal stricture   . Family history of breast cancer   . Gallstones   . Genetic testing 04/02/2018   STAT Breast panel with reflex to Multi-Cancer panel (83 genes) @ [facility] - No pathogenic mutations detected  . GERD (gastroesophageal reflux disease)   . Hiatal hernia   . History of kidney stones   . History of radiation therapy 07/09/2018- 08/03/18   Left Breast/ 40.05 Gy in 15 fractions. Left Breast boost 10 Gy in 5 fractions.   . Hyperlipemia   . Hypothyroidism   . Insomnia   . Malignant neoplasm of upper-outer quadrant of left female breast ([place])   . Nephrolithiasis   . Pneumonia 2015   aspirated after colonosopy  . Renal cyst     Past Surgical History:  Procedure Laterality Date  . BREAST BIOPSY Left 05/31/2019   mri  . BREAST BIOPSY Left 04/2019   malignant  . BREAST LUMPECTOMY Left 04/17/2018   malignant  . BREAST LUMPECTOMY WITH RADIOACTIVE SEED AND SENTINEL LYMPH NODE BIOPSY Left 04/17/2018   Procedure:  LEFT BREAST LUMPECTOMY WITH BRACKETED RADIOACTIVE SEEDS AND SENTINEL LYMPH NODE BIOPSY;  Surgeon: Rolm Bookbinder, MD;  Location: [place];  Service: General;  Laterality: Left;  . BREAST SURGERY Left    Lumpectomy  . CARDIOVASCULAR STRESS TEST  07/19/2006   EF 70%, NO EVIDENCE OF ISCHEMIA  . CHOLECYSTECTOMY  12/30/10  . COLONOSCOPY    . DILATION AND CURETTAGE OF UTERUS    . HYSTEROSCOPY WITH D & C N/A 06/07/2018   Procedure: DILATATION AND CURETTAGE /HYSTEROSCOPY;  Surgeon: [name], MD;  Location: [facility];  Service: Gynecology;  Laterality: N/A;  . inguinal herniography    . NASAL SINUS SURGERY    . RADIOACTIVE SEED GUIDED EXCISIONAL BREAST BIOPSY Right 04/17/2018   Procedure: RIGHT BREAST SEED GUIDED EXCISIONAL BIOPSY;  Surgeon: Rolm Bookbinder, MD;  Location: [place] [facility];  Service: General;  Laterality: Right;  . RE-EXCISION OF BREAST LUMPECTOMY Left 05/15/2018   Procedure: RE-EXCISION OF LEFT BREAST LUMPECTOMY, ASPIRATION OF LEFT AXILLARY SEROMA;  Surgeon: Rolm Bookbinder, MD;  Location: [place];  Service: General;  Laterality: Left;  . T & A    . TOOTH EXTRACTION    . TOTAL KNEE ARTHROPLASTY Left 09/11/2017   Procedure: LEFT TOTAL KNEE ARTHROPLASTY;  Surgeon:  Gaynelle Arabian, MD;  Location: [facility];  Service: Orthopedics;  Laterality: Left;  with block  . US ECHOCARDIOGRAPHY  07/17/2006   EF 55-60%    There were no vitals filed for this visit.   Subjective Assessment - 03/25/21 1504    Subjective "Today is better than yesterday"  Pt went for a walk yesterday for about 30 minutes but had to ice her knee and take 2 adviil    Pertinent History Patient was diagnosed on 03/08/18 with left grade 1-2 invasive ductal carcinoma breast cancer.It measured 1.5 cm and is located in the upper outer quadrant. It is ER/PR positive and HER2 negative with a Ki67 of < 1%.  She had a left total knee replacement in 10/18 with Dr. Wynelle Link and continues  to have pain in her left lateral knee. Her orthopedist reported it is likely due to her iliotibial band. Patient reports she underwent a left lumpectomy and SNLB (0/4 nodes positive) removed on 04/17/18.She completed radiation, but no chemo    Currently in Pain? Yes   left axilla,left knee and right hip and thigh   Pain Score 4     Pain Location Axilla    Pain Orientation Right    Pain Descriptors / Indicators Aching    Pain Radiating Towards at times has shooting pain    Pain Onset More than a month ago    Aggravating Factors  standing and walking                             [facility] Adult PT Treatment/Exercise - 03/25/21 0001      Manual Therapy   Manual Therapy Soft tissue mobilization;Myofascial release    Soft tissue mobilization with coconut butter in supine to Left pectoralis, border of axilla, left trunk area, left incisions and lats.pt with tender areas along border of pec and down onto ribs, come fullness in axilla today    Myofascial Release left chest and lateral trunk prologed pressure with pac major tight area between thumb and finger able to get deeper to pec minor today , scar tissue felt deep on ribs below and between scars from lumpectomy and axillary node removal                    PT Short Term Goals - 03/18/21 3009      PT SHORT TERM GOAL #1   Title Pt will report that she has had a reduction of discomfort in her left axilla and lateral chest to 5/10    Baseline 6/10    Time 3    Period Weeks    Status New      PT SHORT TERM GOAL #2   Title Pt will report she is doing a beginning home exericse program for postural and strength exercises    Time 3    Period Weeks    Status New      PT SHORT TERM GOAL #3   Title Pateitn will improve left single leg stance time to 10 sec with min UE assist    Time 3    Period Weeks    Status New    Target Date 04/08/21      PT SHORT TERM GOAL #4   Title Patiewnt will increase gross bilateral LE  strength to 4+/5    Time 3    Period Weeks    Status New    Target Date 04/08/21  PT SHORT TERM GOAL #5   Title Patient will be independent with basic HEP for Left LE strengthening and right hip stretching and strengthening    Time 3    Period Weeks    Status New    Target Date 04/08/21      PT SHORT TERM GOAL #6   Title Therapy will review FOTO score    Time 3    Period Weeks    Status New    Target Date 04/08/21             PT Long Term Goals - 03/18/21 0829      PT LONG TERM GOAL #1   Title Pt will have painfree left shoulder abduction of 145 degrees so that she can reach into a kitchen cabinet at home    Baseline 134 degrees with 6/10 pain on 02/17/2021    Time 6    Period Weeks    Status New      PT LONG TERM GOAL #2   Title Pt will be independent in progressive resistive home exercise program for strength and fitness    Time 6    Period Weeks    Status New      PT LONG TERM GOAL #3   Title Pt will report that her overall pain and discomfort is decreased by 50%    Time 6    Period Weeks    Status New      PT LONG TERM GOAL #4   Title Patient will walk 3000' with LRAD without increased pain    Time 6    Period Weeks    Status New    Target Date 04/29/21      PT LONG TERM GOAL #5   Title Patient will report <3/10 pain with ADL's in her left knee and right hip    Time 6    Period Weeks    Status New    Target Date 04/29/21                 Plan - 03/25/21 1557    Clinical Impression Statement Pt continues to have issues with multiple areas of pain. She has recurrent tightness in left pec major and minor with some fullness in axilla also that softerns with soft tissue work and MLD. Talked about aquatic therapy for general strenghening and pt is looking forward to starting to do that.    Personal Factors and Comorbidities Comorbidity 3+    Comorbidities Previous Lt TKR with persistent pain, Previous Lt breast lumpectomy and radiation, right  sciatic pain    Examination-Activity Limitations Carry;Lift    Clinical Impairments Affecting Rehab Potential pain in left leg, pain in right leg, decreased tolerance of gait due to pain,    PT Treatment/Interventions ADLs/Self Care Home Management;Therapeutic activities;Therapeutic exercise;Patient/family education;Manual techniques;Manual lymph drainage;Scar mobilization;Passive range of motion;Moist Heat;Balance training;Functional mobility training;Orthotic Fit/Training;Taping;Compression bandaging;Aquatic Therapy;Cryotherapy;Neuromuscular re-education;Dry needling    PT Next Visit Plan exericises for postural retraining and strengthening manual work for soft tissue mobilizaion and myofascial release to tight tissue on left chest, ...........(ortho) continue with manual therapy to right hip. Progress strengthening of bilateral LE; progress to standing exercises as tolerated.           Patient will benefit from skilled therapeutic intervention in order to improve the following deficits and impairments:  Decreased knowledge of precautions,Impaired UE functional use,Decreased range of motion,Postural dysfunction,Decreased scar mobility,Decreased mobility,Pain,Increased fascial restricitons,Increased edema,Abnormal gait,Difficulty walking,Decreased endurance,Decreased activity tolerance,Impaired perceived functional ability,Decreased  knowledge of use of DME,Decreased strength  Visit Diagnosis: Chronic left shoulder pain  Disorder of the skin and subcutaneous tissue related to radiation, unspecified  Muscle weakness (generalized)     Problem List Patient Active Problem List   Diagnosis Date Noted  . Breast mass, right 03/27/2020  . Hepatic steatosis 01/22/2020  . Genetic testing 04/02/2018  . Family history of breast cancer   . Malignant neoplasm of upper-outer quadrant of left breast in female, estrogen receptor positive ([place]) 03/16/2018  . OA (osteoarthritis) of knee 09/11/2017  .  Infrapatellar bursitis of right knee 04/29/2014  . Pneumonia, aspiration ([place]) 12/28/2012  . HYPOTHYROIDISM 11/15/2010  . HYPERLIPIDEMIA 11/15/2010  . DEPRESSION 11/15/2010  . GERD 11/15/2010  . CONSTIPATION 11/15/2010  . GALLSTONES 11/15/2010  . RENAL CYST 11/15/2010  . INSOMNIA UNSPECIFIED 11/15/2010  . COUGH 11/15/2010  . ABDOMINAL PAIN OTHER SPECIFIED SITE 11/15/2010  . TRANSAMINASES, SERUM, ELEVATED 11/15/2010   Donato Heinz. Owens Shark PT  Norwood Levo 03/25/2021, 4:00 PM  [place], [place], [address] Phone: [phone]   Fax:  [phone]  Name: [name] MRN: [id number] Date of Birth: [birth year]

## 2021-03-30 ENCOUNTER — Other Ambulatory Visit: Payer: Self-pay

## 2021-03-30 ENCOUNTER — Ambulatory Visit: Payer: Medicare Other | Admitting: Physical Therapy

## 2021-03-30 DIAGNOSIS — M6281 Muscle weakness (generalized): Secondary | ICD-10-CM

## 2021-03-30 DIAGNOSIS — L599 Disorder of the skin and subcutaneous tissue related to radiation, unspecified: Secondary | ICD-10-CM | POA: Diagnosis not present

## 2021-03-30 DIAGNOSIS — M25512 Pain in left shoulder: Secondary | ICD-10-CM

## 2021-03-30 DIAGNOSIS — G8929 Other chronic pain: Secondary | ICD-10-CM | POA: Diagnosis not present

## 2021-03-30 NOTE — Therapy (Signed)
[facility] [address] [place], [place], [address] Phone: [phone]   Fax:  [phone]  Physical Therapy Treatment  Patient Details  Name: [name] MRN: [id number] Date of Birth: 12/29/1945 Referring Provider (PT): Dr John Russo   Encounter Date: 03/30/2021   PT End of Session - 03/30/21 1609    Visit Number 9    Number of Visits 13    Date for PT Re-Evaluation 03/31/21    PT Start Time 1505    PT Stop Time 1600    PT Time Calculation (min) 55 min    Activity Tolerance Patient tolerated treatment well    Behavior During Therapy [facility] for tasks assessed/performed           Past Medical History:  Diagnosis Date  . Arthritis    hands and knees  . Complication of anesthesia    aspiration pna; following a colonoscopy, pt requests head of bed elevated if possible.  . Constipation   . Cough   . Depression   . Dysrhythmia    RBBB on 06-06-17 ekg   . Elevated liver function tests   . Esophageal stricture   . Family history of breast cancer   . Gallstones   . Genetic testing 04/02/2018   STAT Breast panel with reflex to Multi-Cancer panel (83 genes) @ [facility] - No pathogenic mutations detected  . GERD (gastroesophageal reflux disease)   . Hiatal hernia   . History of kidney stones   . History of radiation therapy 07/09/2018- 08/03/18   Left Breast/ 40.05 Gy in 15 fractions. Left Breast boost 10 Gy in 5 fractions.   . Hyperlipemia   . Hypothyroidism   . Insomnia   . Malignant neoplasm of upper-outer quadrant of left female breast ([facility])   . Nephrolithiasis   . Pneumonia 2015   aspirated after colonosopy  . Renal cyst     Past Surgical History:  Procedure Laterality Date  . BREAST BIOPSY Left 05/31/2019   mri  . BREAST BIOPSY Left 04/2019   malignant  . BREAST LUMPECTOMY Left 04/17/2018   malignant  . BREAST LUMPECTOMY WITH RADIOACTIVE SEED AND SENTINEL LYMPH NODE BIOPSY Left 04/17/2018   Procedure:  LEFT BREAST LUMPECTOMY WITH BRACKETED RADIOACTIVE SEEDS AND SENTINEL LYMPH NODE BIOPSY;  Surgeon: Wakefield, Matthew, MD;  Location: [facility];  Service: General;  Laterality: Left;  . BREAST SURGERY Left    Lumpectomy  . CARDIOVASCULAR STRESS TEST  07/19/2006   EF 70%, NO EVIDENCE OF ISCHEMIA  . CHOLECYSTECTOMY  12/30/10  . COLONOSCOPY    . DILATION AND CURETTAGE OF UTERUS    . HYSTEROSCOPY WITH D & C N/A 06/07/2018   Procedure: DILATATION AND CURETTAGE /HYSTEROSCOPY;  Surgeon: Grewal, Michelle, MD;  Location: [facility];  Service: Gynecology;  Laterality: N/A;  . inguinal herniography    . NASAL SINUS SURGERY    . RADIOACTIVE SEED GUIDED EXCISIONAL BREAST BIOPSY Right 04/17/2018   Procedure: RIGHT BREAST SEED GUIDED EXCISIONAL BIOPSY;  Surgeon: Wakefield, Matthew, MD;  Location: [facility];  Service: General;  Laterality: Right;  . RE-EXCISION OF BREAST LUMPECTOMY Left 05/15/2018   Procedure: RE-EXCISION OF LEFT BREAST LUMPECTOMY, ASPIRATION OF LEFT AXILLARY SEROMA;  Surgeon: Wakefield, Matthew, MD;  Location: [facility];  Service: General;  Laterality: Left;  . T & A    . TOOTH EXTRACTION    . TOTAL KNEE ARTHROPLASTY Left 09/11/2017   Procedure: LEFT TOTAL KNEE ARTHROPLASTY;  Surgeon:   Aluisio, Frank, MD;  Location: [facility];  Service: Orthopedics;  Laterality: Left;  with block  . US ECHOCARDIOGRAPHY  07/17/2006   EF 55-60%    There were no vitals filed for this visit.   Subjective Assessment - 03/30/21 1515    Subjective Pt says she is still hurting in her chest/axilla and is having pain all the way down to her right ankle .    Pertinent History Patient was diagnosed on 03/08/18 with left grade 1-2 invasive ductal carcinoma breast cancer.It measured 1.5 cm and is located in the upper outer quadrant. It is ER/PR positive and HER2 negative with a Ki67 of < 1%.  She had a left total knee replacement in 10/18 with Dr. Aluisio and continues to have pain in her  left lateral knee. Her orthopedist reported it is likely due to her iliotibial band. Patient reports she underwent a left lumpectomy and SNLB (0/4 nodes positive) removed on 04/17/18.She completed radiation, but no chemo    Currently in Pain? Yes    Pain Score 7     Pain Location Axilla    Pain Descriptors / Indicators Aching    Pain Type Chronic pain    Pain Radiating Towards at times shooting to breast    Pain Relieving Factors rest    Pain Descriptors / Indicators Aching    Pain Onset More than a month ago                             [facility] Adult PT Treatment/Exercise - 03/30/21 0001      Exercises   Exercises Shoulder      Shoulder Exercises: Supine   Protraction Strengthening;Right;Left;Weights;10 reps    Protraction Weight (lbs) 2    Protraction Limitations 2 sets of 5    External Rotation Limitations painful with isomtrics    Flexion Limitations small circles with hand pointed to ceiling    Other Supine Exercises chest press 3 sets of 5 with 2# weights    Other Supine Exercises small range of overhead latt pull with hands on either end of 5# weight x 5 reps      Manual Therapy   Manual Therapy Soft tissue mobilization    Soft tissue mobilization with coconut butter in supine to Left pectoralis, border of axilla, left trunk area, left incisions and lats, then to sidelying for work on external rotators and posterior shoulder.                    PT Short Term Goals - 03/18/21 0823      PT SHORT TERM GOAL #1   Title Pt will report that she has had a reduction of discomfort in her left axilla and lateral chest to 5/10    Baseline 6/10    Time 3    Period Weeks    Status New      PT SHORT TERM GOAL #2   Title Pt will report she is doing a beginning home exericse program for postural and strength exercises    Time 3    Period Weeks    Status New      PT SHORT TERM GOAL #3   Title Pateitn will improve left single leg stance time to 10 sec with  min UE assist    Time 3    Period Weeks    Status New    Target Date 04/08/21      PT   SHORT TERM GOAL #4   Title Patiewnt will increase gross bilateral LE strength to 4+/5    Time 3    Period Weeks    Status New    Target Date 04/08/21      PT SHORT TERM GOAL #5   Title Patient will be independent with basic HEP for Left LE strengthening and right hip stretching and strengthening    Time 3    Period Weeks    Status New    Target Date 04/08/21      PT SHORT TERM GOAL #6   Title Therapy will review FOTO score    Time 3    Period Weeks    Status New    Target Date 04/08/21             PT Long Term Goals - 03/18/21 0829      PT LONG TERM GOAL #1   Title Pt will have painfree left shoulder abduction of 145 degrees so that she can reach into a kitchen cabinet at home    Baseline 134 degrees with 6/10 pain on 02/17/2021    Time 6    Period Weeks    Status New      PT LONG TERM GOAL #2   Title Pt will be independent in progressive resistive home exercise program for strength and fitness    Time 6    Period Weeks    Status New      PT LONG TERM GOAL #3   Title Pt will report that her overall pain and discomfort is decreased by 50%    Time 6    Period Weeks    Status New      PT LONG TERM GOAL #4   Title Patient will walk 3000' with LRAD without increased pain    Time 6    Period Weeks    Status New    Target Date 04/29/21      PT LONG TERM GOAL #5   Title Patient will report <3/10 pain with ADL's in her left knee and right hip    Time 6    Period Weeks    Status New    Target Date 04/29/21                 Plan - 03/30/21 1619    Clinical Impression Statement Pain continues.  Pt does feel better with soft tissue work to area.  Began strengthening today with sets of 3-5 reps with small weight and pt tolerated well. She expects to be sore later. Tried technique to help sciatica by  activate right pirformis today to see if it will help with sciatic pain;  from sitting with right leg in modifed figure 4 with foot on padded footstool, she did isomtric internal rotation into hand for 15 sec, then release with push into stretch into external rotation and forward flexion of trunk to stretch pririformis.    Comorbidities Previous Lt TKR with persistent pain, Previous Lt breast lumpectomy and radiation, right sciatic pain    Stability/Clinical Decision Making Evolving/Moderate complexity    Rehab Potential Good    Clinical Impairments Affecting Rehab Potential pain in left leg, pain in right leg, decreased tolerance of gait due to pain,    PT Frequency 2x / week    PT Duration 6 weeks    PT Treatment/Interventions ADLs/Self Care Home Management;Therapeutic activities;Therapeutic exercise;Patient/family education;Manual techniques;Manual lymph drainage;Scar mobilization;Passive range of motion;Moist Heat;Balance training;Functional mobility training;Orthotic Fit/Training;Taping;Compression bandaging;Aquatic Therapy;Cryotherapy;Neuromuscular  re-education;Dry needling    PT Next Visit Plan Recert next cancer PT visit and decrease to one time a week for 4 weeks. exericises for postural retraining and strengthening manual work for soft tissue mobilizaion and myofascial release to tight tissue on left chest, ...........(ortho) continue with manual therapy to right hip. Progress strengthening of bilateral LE; progress to standing exercises as tolerated.    Consulted and Agree with Plan of Care Patient           Patient will benefit from skilled therapeutic intervention in order to improve the following deficits and impairments:  Decreased knowledge of precautions,Impaired UE functional use,Decreased range of motion,Postural dysfunction,Decreased scar mobility,Decreased mobility,Pain,Increased fascial restricitons,Increased edema,Abnormal gait,Difficulty walking,Decreased endurance,Decreased activity tolerance,Impaired perceived functional ability,Decreased knowledge  of use of DME,Decreased strength  Visit Diagnosis: Chronic left shoulder pain  Disorder of the skin and subcutaneous tissue related to radiation, unspecified  Muscle weakness (generalized)     Problem List Patient Active Problem List   Diagnosis Date Noted  . Breast mass, right 03/27/2020  . Hepatic steatosis 01/22/2020  . Genetic testing 04/02/2018  . Family history of breast cancer   . Malignant neoplasm of upper-outer quadrant of left breast in female, estrogen receptor positive ([place]) 03/16/2018  . OA (osteoarthritis) of knee 09/11/2017  . Infrapatellar bursitis of right knee 04/29/2014  . Pneumonia, aspiration ([place]) 12/28/2012  . HYPOTHYROIDISM 11/15/2010  . HYPERLIPIDEMIA 11/15/2010  . DEPRESSION 11/15/2010  . GERD 11/15/2010  . CONSTIPATION 11/15/2010  . GALLSTONES 11/15/2010  . RENAL CYST 11/15/2010  . INSOMNIA UNSPECIFIED 11/15/2010  . COUGH 11/15/2010  . ABDOMINAL PAIN OTHER SPECIFIED SITE 11/15/2010  . TRANSAMINASES, SERUM, ELEVATED 11/15/2010   Donato Heinz. Owens Shark PT  Norwood Levo 03/30/2021, 4:26 PM  [place], [place], [address] Phone: [phone]   Fax:  [phone]  Name: [name] MRN: [id number] Date of Birth: 08-Aug-1946

## 2021-03-31 ENCOUNTER — Encounter: Payer: Self-pay | Admitting: Physical Therapy

## 2021-03-31 ENCOUNTER — Ambulatory Visit: Payer: Medicare Other | Admitting: Physical Therapy

## 2021-03-31 DIAGNOSIS — G8929 Other chronic pain: Secondary | ICD-10-CM | POA: Diagnosis not present

## 2021-03-31 DIAGNOSIS — M6281 Muscle weakness (generalized): Secondary | ICD-10-CM

## 2021-03-31 DIAGNOSIS — M25512 Pain in left shoulder: Secondary | ICD-10-CM

## 2021-03-31 DIAGNOSIS — L599 Disorder of the skin and subcutaneous tissue related to radiation, unspecified: Secondary | ICD-10-CM | POA: Diagnosis not present

## 2021-04-01 ENCOUNTER — Encounter: Payer: Self-pay | Admitting: Physical Therapy

## 2021-04-01 NOTE — Therapy (Signed)
[place], [place], [address] Phone: [phone]   Fax:  [phone]  Physical Therapy Treatment  Patient Details  Name: [name] MRN: [id number] Date of Birth: Dec 06, 1946 Referring Provider (PT): Dr Shon Baton   Encounter Date: 03/31/2021   PT End of Session - 04/01/21 1009    Visit Number 2   2 for ortho   Number of Visits --   12 for ortho   Date for PT Re-Evaluation --   04/28/2021 for ortho   Authorization Type MCR/[employer]    PT Start Time 1545    PT Stop Time 1628    PT Time Calculation (min) 43 min    Activity Tolerance Patient tolerated treatment well    Behavior During Therapy [facility] for tasks assessed/performed           Past Medical History:  Diagnosis Date  . Arthritis    hands and knees  . Complication of anesthesia    aspiration pna; following a colonoscopy, pt requests head of bed elevated if possible.  . Constipation   . Cough   . Depression   . Dysrhythmia    RBBB on 06-06-17 ekg   . Elevated liver function tests   . Esophageal stricture   . Family history of breast cancer   . Gallstones   . Genetic testing 04/02/2018   STAT Breast panel with reflex to Multi-Cancer panel (83 genes) @ [facility] - No pathogenic mutations detected  . GERD (gastroesophageal reflux disease)   . Hiatal hernia   . History of kidney stones   . History of radiation therapy 07/09/2018- 08/03/18   Left Breast/ 40.05 Gy in 15 fractions. Left Breast boost 10 Gy in 5 fractions.   . Hyperlipemia   . Hypothyroidism   . Insomnia   . Malignant neoplasm of upper-outer quadrant of left female breast ([place])   . Nephrolithiasis   . Pneumonia 2015   aspirated after colonosopy  . Renal cyst     Past Surgical History:  Procedure Laterality Date  . BREAST BIOPSY Left 05/31/2019   mri  . BREAST BIOPSY Left 04/2019   malignant  . BREAST LUMPECTOMY Left 04/17/2018   malignant  . BREAST LUMPECTOMY WITH  RADIOACTIVE SEED AND SENTINEL LYMPH NODE BIOPSY Left 04/17/2018   Procedure: LEFT BREAST LUMPECTOMY WITH BRACKETED RADIOACTIVE SEEDS AND SENTINEL LYMPH NODE BIOPSY;  Surgeon: Rolm Bookbinder, MD;  Location: [place];  Service: General;  Laterality: Left;  . BREAST SURGERY Left    Lumpectomy  . CARDIOVASCULAR STRESS TEST  07/19/2006   EF 70%, NO EVIDENCE OF ISCHEMIA  . CHOLECYSTECTOMY  12/30/10  . COLONOSCOPY    . DILATION AND CURETTAGE OF UTERUS    . HYSTEROSCOPY WITH D & C N/A 06/07/2018   Procedure: DILATATION AND CURETTAGE /HYSTEROSCOPY;  Surgeon: [name], MD;  Location: [facility];  Service: Gynecology;  Laterality: N/A;  . inguinal herniography    . NASAL SINUS SURGERY    . RADIOACTIVE SEED GUIDED EXCISIONAL BREAST BIOPSY Right 04/17/2018   Procedure: RIGHT BREAST SEED GUIDED EXCISIONAL BIOPSY;  Surgeon: Rolm Bookbinder, MD;  Location: [place];  Service: General;  Laterality: Right;  . RE-EXCISION OF BREAST LUMPECTOMY Left 05/15/2018   Procedure: RE-EXCISION OF LEFT BREAST LUMPECTOMY, ASPIRATION OF LEFT AXILLARY SEROMA;  Surgeon: Rolm Bookbinder, MD;  Location: [place];  Service: General;  Laterality: Left;  . T & A    . TOOTH EXTRACTION    .  TOTAL KNEE ARTHROPLASTY Left 09/11/2017   Procedure: LEFT TOTAL KNEE ARTHROPLASTY;  Surgeon: Ollen Gross, MD;  Location: [facility];  Service: Orthopedics;  Laterality: Left;  with block  . US ECHOCARDIOGRAPHY  07/17/2006   EF 55-60%    There were no vitals filed for this visit.   Subjective Assessment - 03/31/21 1551    Subjective Patient reports her hips and knees hurt after her last visit. She continues to have the same high level of pain. She has started walking outside for exercise. She is using a cane now which ahs helped her pain with walking.    Pertinent History Patient was diagnosed on 03/08/18 with left grade 1-2 invasive ductal carcinoma breast cancer.It measured 1.5 cm and is  located in the upper outer quadrant. It is ER/PR positive and HER2 negative with a Ki67 of < 1%.  She had a left total knee replacement in 10/18 with Dr. Lequita Halt and continues to have pain in her left lateral knee. Her orthopedist reported it is likely due to her iliotibial band. Patient reports she underwent a left lumpectomy and SNLB (0/4 nodes positive) removed on 04/17/18.She completed radiation, but no chemo    Limitations Standing;Walking    How long can you stand comfortably? limited time before she has pain    How long can you walk comfortably? depends on the day    Diagnostic tests MRI of lumbar spine: L4 disc buldge with possible L5 nerve root impingement    Patient Stated Goals to get rid of the left armpit and arm pain    Currently in Pain? Yes    Pain Score 8     Pain Location Back    Pain Orientation Right    Pain Descriptors / Indicators Aching    Pain Type Chronic pain    Pain Radiating Towards radiating into the right hip    Pain Onset More than a month ago    Pain Frequency Constant    Aggravating Factors  standing and walking    Pain Relieving Factors rest    Effect of Pain on Daily Activities difficulty performing ADL's    Multiple Pain Sites No                             [facility] Adult PT Treatment/Exercise - 04/01/21 0001      Self-Care   Other Self-Care Comments  reviewed pain neuroe education concepts to decrease central nervouse respose of pain. Educated patient on the ability of our brain to modualte pain.      Knee/Hip Exercises: Stretches   Other Knee/Hip Stretches piriformis stretch 3x20 sec hold    Other Knee/Hip Stretches LTR 2x10 in limited positions      Knee/Hip Exercises: Supine   Quad Sets Limitations x10 5 sec hold    Other Supine Knee/Hip Exercises reviewed decompression position: reviewed breathing from this position ; reviewed shoulde rpress and porgression to leg press;    Other Supine Knee/Hip Exercises supine clamshell 2x10  yellow      Manual Therapy   Soft tissue mobilization to hip and lumbar spine    Manual Traction gentle grade I and II occialtions to reduce inflamation                  PT Education - 04/01/21 1009    Education Details HEP and symptom mangement    Person(s) Educated Patient    Methods Explanation;Demonstration;Tactile cues;Verbal cues  Comprehension Verbalized understanding;Returned demonstration;Verbal cues required;Tactile cues required            PT Short Term Goals - 03/18/21 0823      PT SHORT TERM GOAL #1   Title Pt will report that she has had a reduction of discomfort in her left axilla and lateral chest to 5/10    Baseline 6/10    Time 3    Period Weeks    Status New      PT SHORT TERM GOAL #2   Title Pt will report she is doing a beginning home exericse program for postural and strength exercises    Time 3    Period Weeks    Status New      PT SHORT TERM GOAL #3   Title Pateitn will improve left single leg stance time to 10 sec with min UE assist    Time 3    Period Weeks    Status New    Target Date 04/08/21      PT SHORT TERM GOAL #4   Title Patiewnt will increase gross bilateral LE strength to 4+/5    Time 3    Period Weeks    Status New    Target Date 04/08/21      PT SHORT TERM GOAL #5   Title Patient will be independent with basic HEP for Left LE strengthening and right hip stretching and strengthening    Time 3    Period Weeks    Status New    Target Date 04/08/21      PT SHORT TERM GOAL #6   Title Therapy will review FOTO score    Time 3    Period Weeks    Status New    Target Date 04/08/21             PT Long Term Goals - 03/18/21 0829      PT LONG TERM GOAL #1   Title Pt will have painfree left shoulder abduction of 145 degrees so that she can reach into a kitchen cabinet at home    Baseline 134 degrees with 6/10 pain on 02/17/2021    Time 6    Period Weeks    Status New      PT LONG TERM GOAL #2   Title Pt will  be independent in progressive resistive home exercise program for strength and fitness    Time 6    Period Weeks    Status New      PT LONG TERM GOAL #3   Title Pt will report that her overall pain and discomfort is decreased by 50%    Time 6    Period Weeks    Status New      PT LONG TERM GOAL #4   Title Patient will walk 3000' with LRAD without increased pain    Time 6    Period Weeks    Status New    Target Date 04/29/21      PT LONG TERM GOAL #5   Title Patient will report <3/10 pain with ADL's in her left knee and right hip    Time 6    Period Weeks    Status New    Target Date 04/29/21                 Plan - 04/01/21 1145    Clinical Impression Statement Therapy reviewed concept of hurt not harm and worked on PNE techniuqes to decrease pain.  She was given decompression activity and advised to try when she is hurting to see if pain can become more managemeble. She is walking with her cane outside which therapy advised her was excellent and progress in itiself eevn if she is in pain. She will transfe rto [facility] and begin aquatic rehab. She had difficulty with SLR for her HEP. She was given a clamshell instead. Therapy did a lot of education on the ability of our brain to modulate pain.    Personal Factors and Comorbidities Comorbidity 3+    Comorbidities Previous Lt TKR with persistent pain, Previous Lt breast lumpectomy and radiation, right sciatic pain    Examination-Activity Limitations Carry;Lift    Stability/Clinical Decision Making Evolving/Moderate complexity    Clinical Decision Making Moderate    Rehab Potential Good    Clinical Impairments Affecting Rehab Potential pain in left leg, pain in right leg, decreased tolerance of gait due to pain,    PT Frequency 2x / week    PT Duration 6 weeks    PT Treatment/Interventions ADLs/Self Care Home Management;Therapeutic activities;Therapeutic exercise;Patient/family education;Manual techniques;Manual lymph  drainage;Scar mobilization;Passive range of motion;Moist Heat;Balance training;Functional mobility training;Orthotic Fit/Training;Taping;Compression bandaging;Aquatic Therapy;Cryotherapy;Neuromuscular re-education;Dry needling    PT Next Visit Plan Recert next cancer PT visit and decrease to one time a week for 4 weeks. exericises for postural retraining and strengthening manual work for soft tissue mobilizaion and myofascial release to tight tissue on left chest, ...........(ortho) continue with manual therapy to right hip. Progress strengthening of bilateral LE; progress to standing exercises as tolerated.    PT Home Exercise Plan quad set, SLR,  prifriformis stretch; hamstring stretch;    Consulted and Agree with Plan of Care Patient           Patient will benefit from skilled therapeutic intervention in order to improve the following deficits and impairments:  Decreased knowledge of precautions,Impaired UE functional use,Decreased range of motion,Postural dysfunction,Decreased scar mobility,Decreased mobility,Pain,Increased fascial restricitons,Increased edema,Abnormal gait,Difficulty walking,Decreased endurance,Decreased activity tolerance,Impaired perceived functional ability,Decreased knowledge of use of DME,Decreased strength  Visit Diagnosis: Chronic left shoulder pain  Disorder of the skin and subcutaneous tissue related to radiation, unspecified  Muscle weakness (generalized)     Problem List Patient Active Problem List   Diagnosis Date Noted  . Breast mass, right 03/27/2020  . Hepatic steatosis 01/22/2020  . Genetic testing 04/02/2018  . Family history of breast cancer   . Malignant neoplasm of upper-outer quadrant of left breast in female, estrogen receptor positive ([place]) 03/16/2018  . OA (osteoarthritis) of knee 09/11/2017  . Infrapatellar bursitis of right knee 04/29/2014  . Pneumonia, aspiration ([place]) 12/28/2012  . HYPOTHYROIDISM 11/15/2010  . HYPERLIPIDEMIA  11/15/2010  . DEPRESSION 11/15/2010  . GERD 11/15/2010  . CONSTIPATION 11/15/2010  . GALLSTONES 11/15/2010  . RENAL CYST 11/15/2010  . INSOMNIA UNSPECIFIED 11/15/2010  . COUGH 11/15/2010  . ABDOMINAL PAIN OTHER SPECIFIED SITE 11/15/2010  . TRANSAMINASES, SERUM, ELEVATED 11/15/2010    Carney Living PT DPT  04/01/2021, 12:48 PM  [facility] [address] [place], [place], [address] Phone: [phone]   Fax:  [phone]  Name: [name] MRN: [id number] Date of Birth: Jun 19, 1946

## 2021-04-02 ENCOUNTER — Other Ambulatory Visit: Payer: Self-pay

## 2021-04-02 ENCOUNTER — Encounter: Payer: Self-pay | Admitting: Physical Therapy

## 2021-04-02 ENCOUNTER — Ambulatory Visit: Payer: Medicare Other | Admitting: Physical Therapy

## 2021-04-02 DIAGNOSIS — G8929 Other chronic pain: Secondary | ICD-10-CM

## 2021-04-02 DIAGNOSIS — M6281 Muscle weakness (generalized): Secondary | ICD-10-CM

## 2021-04-02 NOTE — Therapy (Signed)
[facility] [address] [place], [place], [address] Phone: [phone]   Fax:  [phone]  Physical Therapy Treatment  Patient Details  Name: [name] MRN: [id number] Date of Birth: 02/04/1946 Referring Provider (PT): Dr. Wakefield   Progress Note Reporting Period 02/17/2021 to 04/02/2021  See note below for Objective Data and Assessment of Progress/Goals.      Encounter Date: 04/02/2021   PT End of Session - 04/02/21 1227    Visit Number 10   2 for ortho   Number of Visits 17   12 for ortho   Date for PT Re-Evaluation 04/29/21   04/28/2021 for ortho   PT Start Time 1105    PT Stop Time 1200    PT Time Calculation (min) 55 min    Activity Tolerance Patient tolerated treatment well    Behavior During Therapy [facility] for tasks assessed/performed           Past Medical History:  Diagnosis Date  . Arthritis    hands and knees  . Complication of anesthesia    aspiration pna; following a colonoscopy, pt requests head of bed elevated if possible.  . Constipation   . Cough   . Depression   . Dysrhythmia    RBBB on 06-06-17 ekg   . Elevated liver function tests   . Esophageal stricture   . Family history of breast cancer   . Gallstones   . Genetic testing 04/02/2018   STAT Breast panel with reflex to Multi-Cancer panel (83 genes) @ [facility] - No pathogenic mutations detected  . GERD (gastroesophageal reflux disease)   . Hiatal hernia   . History of kidney stones   . History of radiation therapy 07/09/2018- 08/03/18   Left Breast/ 40.05 Gy in 15 fractions. Left Breast boost 10 Gy in 5 fractions.   . Hyperlipemia   . Hypothyroidism   . Insomnia   . Malignant neoplasm of upper-outer quadrant of left female breast ([facility])   . Nephrolithiasis   . Pneumonia 2015   aspirated after colonosopy  . Renal cyst     Past Surgical History:  Procedure Laterality Date  . BREAST BIOPSY Left 05/31/2019   mri  . BREAST  BIOPSY Left 04/2019   malignant  . BREAST LUMPECTOMY Left 04/17/2018   malignant  . BREAST LUMPECTOMY WITH RADIOACTIVE SEED AND SENTINEL LYMPH NODE BIOPSY Left 04/17/2018   Procedure: LEFT BREAST LUMPECTOMY WITH BRACKETED RADIOACTIVE SEEDS AND SENTINEL LYMPH NODE BIOPSY;  Surgeon: Wakefield, Matthew, MD;  Location: [facility];  Service: General;  Laterality: Left;  . BREAST SURGERY Left    Lumpectomy  . CARDIOVASCULAR STRESS TEST  07/19/2006   EF 70%, NO EVIDENCE OF ISCHEMIA  . CHOLECYSTECTOMY  12/30/10  . COLONOSCOPY    . DILATION AND CURETTAGE OF UTERUS    . HYSTEROSCOPY WITH D & C N/A 06/07/2018   Procedure: DILATATION AND CURETTAGE /HYSTEROSCOPY;  Surgeon: Grewal, Michelle, MD;  Location: [facility];  Service: Gynecology;  Laterality: N/A;  . inguinal herniography    . NASAL SINUS SURGERY    . RADIOACTIVE SEED GUIDED EXCISIONAL BREAST BIOPSY Right 04/17/2018   Procedure: RIGHT BREAST SEED GUIDED EXCISIONAL BIOPSY;  Surgeon: Wakefield, Matthew, MD;  Location: [facility];  Service: General;  Laterality: Right;  . RE-EXCISION OF BREAST LUMPECTOMY Left 05/15/2018   Procedure: RE-EXCISION OF LEFT BREAST LUMPECTOMY, ASPIRATION OF LEFT AXILLARY SEROMA;  Surgeon: Wakefield, Matthew, MD;  Location: [facility];    Service: General;  Laterality: Left;  . T & A    . TOOTH EXTRACTION    . TOTAL KNEE ARTHROPLASTY Left 09/11/2017   Procedure: LEFT TOTAL KNEE ARTHROPLASTY;  Surgeon: Gaynelle Arabian, MD;  Location: [facility];  Service: Orthopedics;  Laterality: Left;  with block  . US ECHOCARDIOGRAPHY  07/17/2006   EF 55-60%    There were no vitals filed for this visit.   Subjective Assessment - 04/02/21 1106    Subjective "There is no improvment"  Pt states that she feels better after PT but it does not last.  She had alot of pain and tried the decompression exercise with deep breathing that Waunita Schooner showed her and it did help her fall asleep.  She reports she is really trying  to improve her mental attitude to help her get better    Pertinent History Patient was diagnosed on 03/08/18 with left grade 1-2 invasive ductal carcinoma breast cancer.It measured 1.5 cm and is located in the upper outer quadrant. It is ER/PR positive and HER2 negative with a Ki67 of < 1%.  She had a left total knee replacement in 10/18 with Dr. Wynelle Link and continues to have pain in her left lateral knee. Her orthopedist reported it is likely due to her iliotibial band. Patient reports she underwent a left lumpectomy and SNLB (0/4 nodes positive) removed on 04/17/18.She completed radiation, but no chemo    Patient Stated Goals to get rid of the left armpit and arm pain    Currently in Pain? Yes    Pain Score 5     Pain Orientation Left    Pain Descriptors / Indicators Sharp    Pain Type Chronic pain    Pain Radiating Towards always the same place in chest and axilla    Pain Onset More than a month ago    Pain Frequency Intermittent              [facility] PT Assessment - 04/02/21 0001      Assessment   Medical Diagnosis s/p left lumpectomy and SLNB    Referring Provider (PT) Dr. Donne Hazel     Onset Date/Surgical Date 04/17/18      Prior Function   Level of Independence Independent                         [facility] Adult PT Treatment/Exercise - 04/02/21 0001      Manual Therapy   Manual Therapy Soft tissue mobilization;Edema management    Edema Management provided soft foam/ chip pack for pt to wear inside bra at firm tender spot  at lateral ribs just distal to incision    Soft tissue mobilization with coconut butter in supine to Left pectoralis, border of axilla, left trunk area, left incisions and lats, then to sidelying for work on external rotators and posterior shoulder.                    PT Short Term Goals - 04/02/21 1240      PT SHORT TERM GOAL #1   Baseline 6/10, 04/02/2021 5/10    Status Achieved      PT SHORT TERM GOAL #2   Title Pt will report she  is doing a beginning home exericse program for postural and strength exercises    Status Achieved             PT Long Term Goals - 04/02/21 1230      PT  LONG TERM GOAL #1   Title Pt will have painfree left shoulder abduction of 145 degrees so that she can reach into a kitchen cabinet at home    Baseline 134 degrees with 6/10 pain on 02/17/2021    Time 4    Period Weeks    Status On-going      PT LONG TERM GOAL #2   Title Pt will be independent in progressive resistive home exercise program for strength and fitness    Baseline pt is limited in progress of exericse program by pain    Time 4    Period Weeks    Status On-going      PT LONG TERM GOAL #3   Title Pt will report that her overall pain and discomfort is decreased by 50%    Time 4    Period Weeks    Status On-going                 Plan - 04/02/21 1231    Clinical Impression Statement Pt is still having pain in her chest that limits the use of her arm. She says that she feels better at the end of every PT session but the pain returns.  While her pec major/minor and posterior shoulder and axillary muscles remain tight, they soften during manual work and she is able to tolerale much deep myofascial work to areas just distal to her incsion at sit of previous seroma.  Added another foam patch for compression to this area today. She is receiving concurrent orthopedic PT, with David Carrol, PT  to treat her sciatic and right leg pain. Our collaborative long term goal is to get Betsy to the aquatics PT program at [facility] where she might more easily exercise to increase her level of fitness so she can tolerate more activiites with less pain  Renewal sent for more PT from [facility] with decreased frequency of one time a week till she gets to aquatics    Personal Factors and Comorbidities Comorbidity 3+    Comorbidities Previous Lt TKR with persistent pain, Previous Lt breast lumpectomy and radiation, right sciatic pain     Stability/Clinical Decision Making Evolving/Moderate complexity    Clinical Decision Making Moderate    Clinical Impairments Affecting Rehab Potential pain in left leg, pain in right leg, decreased tolerance of gait due to pain,    PT Frequency 1x / week    PT Duration 4 weeks    PT Treatment/Interventions ADLs/Self Care Home Management;Therapeutic activities;Therapeutic exercise;Patient/family education;Manual techniques;Manual lymph drainage;Scar mobilization;Passive range of motion;Moist Heat;Balance training;Functional mobility training;Orthotic Fit/Training;Taping;Compression bandaging;Aquatic Therapy;Cryotherapy;Neuromuscular re-education;Dry needling    PT Next Visit Plan cancer PT : exericises for postural retraining and strengthening manual work for soft tissue mobilizaion and myofascial release to tight tissue on left chest, ...........(ortho) continue with manual therapy to right hip. Progress strengthening of bilateral LE; progress to standing exercises as tolerated.    Consulted and Agree with Plan of Care Patient           Patient will benefit from skilled therapeutic intervention in order to improve the following deficits and impairments:  Decreased knowledge of precautions,Impaired UE functional use,Decreased range of motion,Postural dysfunction,Decreased scar mobility,Decreased mobility,Pain,Increased fascial restricitons,Increased edema,Abnormal gait,Difficulty walking,Decreased endurance,Decreased activity tolerance,Impaired perceived functional ability,Decreased knowledge of use of DME,Decreased strength  Visit Diagnosis: Chronic left shoulder pain - Plan: PT plan of care cert/re-cert  Muscle weakness (generalized) - Plan: PT plan of care cert/re-cert     Problem List Patient Active   Problem List   Diagnosis Date Noted  . Breast mass, right 03/27/2020  . Hepatic steatosis 01/22/2020  . Genetic testing 04/02/2018  . Family history of breast cancer   . Malignant  neoplasm of upper-outer quadrant of left breast in female, estrogen receptor positive ([facility]) 03/16/2018  . OA (osteoarthritis) of knee 09/11/2017  . Infrapatellar bursitis of right knee 04/29/2014  . Pneumonia, aspiration ([facility]) 12/28/2012  . HYPOTHYROIDISM 11/15/2010  . HYPERLIPIDEMIA 11/15/2010  . DEPRESSION 11/15/2010  . GERD 11/15/2010  . CONSTIPATION 11/15/2010  . GALLSTONES 11/15/2010  . RENAL CYST 11/15/2010  . INSOMNIA UNSPECIFIED 11/15/2010  . COUGH 11/15/2010  . ABDOMINAL PAIN OTHER SPECIFIED SITE 11/15/2010  . TRANSAMINASES, SERUM, ELEVATED 11/15/2010   Teresa K. Brown, PT  Brown, Teresa Krall 04/02/2021, 12:42 PM  [facility] [address] , [place], [address] Phone: [phone]   Fax:  [phone]  Name: [name] MRN: [id number] Date of Birth: 04/23/1946   

## 2021-04-04 NOTE — Progress Notes (Signed)
[facility]  Telephone:(336) [phone] Fax:(336) [phone]     ID: [name] DOB: Apr 30, 1946  MR#: [id number]  ZWC#:[id number]  Patient Care Team: Shon Baton, MD as PCP - General Rolm Bookbinder, MD as Consulting Physician (General Surgery) Marlette Curvin, Virgie Dad, MD as Consulting Physician (Oncology) Eppie Gibson, MD as Attending Physician (Radiation Oncology) Lelon Perla, MD as Consulting Physician (Cardiology) [name], MD as Consulting Physician (Obstetrics and Gynecology) Jacquelynn Cree, PT as Physical Therapist (Physical Therapy) Donzetta Sprung., MD as Referring Physician (Sports Medicine) OTHER MD:  I connected with [name] on 04/05/21 at  1:00 PM EDT by telephone visit and verified that I am speaking with the correct person using two identifiers.   I discussed the limitations, risks, security and privacy concerns of performing an evaluation and management service by telemedicine and the availability of in-person appointments. I also discussed with the patient that there may be a patient responsible charge related to this service. The patient expressed understanding and agreed to proceed.   Other persons participating in the visit and their role in the encounter: husband  Patient's location: home Provider's location: [place]  Total time spent: 15 min   CHIEF COMPLAINT: Estrogen receptor positive breast cancer  CURRENT TREATMENT: anastrozole   INTERVAL HISTORY: [name] was contacted today for follow-up of her estrogen receptor positive breast cancer.    She was prescribed anastrozole at her last visit, to start on 03/15/2021.  She thinks she is tolerating this better.  She does have hot flashes but they are less then with tamoxifen.  She has some cognitive issues but they are no worse than before.  That is pretty stable.  She has developed some arthralgias particularly involving the hands.  On the other hand she is  now more active doing a lot of gardening because the weather is better.  She is having "struggling dreams".  This morning she went downstairs for her coffee and fell asleep right through her 10:00 church appointment.   REVIEW OF SYSTEMS: Aside from the issues detailed above detailed review of systems today was stable   COVID 19 VACCINATION STATUS: Status post Moderna x2, most recently February 2021, with booster October 2022    HISTORY OF CURRENT ILLNESS: From the original intake note:  "[name]" had routine diagnostic mammography on 03/08/2018 showing breast density category D.  There was a 1.5 cm irregular mass in the left upper outer quadrant posterior depth.  This was further imaged with ultrasonography on the same day.  The left axilla was sonographically benign.  No abnormalities were seen in the left axilla.  Accordingly on 03/08/2018 she proceeded to biopsy of the left breast area in question. The pathology from this procedure showed ([id number]): Invasive ductal carcinoma, grade I-II. HER-2 negative with the ratio being 1.61 and number per cell 3.30.  The tumor was estrogen receptor 100% positive and progesterone receptor 100% positive, both with strong staining intensity.  Ki67 was <1%.   On 03/18/2018, she underwent a bilateral breast MRI with and without contrast, showing the mass in question in a setting of 3.3 cm non-masslike enhancement in the posterior aspect of the left upper-outer quadrant. in addition there was a 1.1 cm mass in the left upper-outer quadrant located 2.1 cm anterior and lateral to the biopsy proven malignant.   Right breast showed two indeterminate 5 mm adjacent enhancing nodules in the upper-inner quadrant.   The patient's subsequent history is as detailed below.  PAST MEDICAL HISTORY: Past Medical History:  Diagnosis Date  . Arthritis    hands and knees  . Complication of anesthesia    aspiration pna; following a colonoscopy, pt requests head of bed  elevated if possible.  . Constipation   . Cough   . Depression   . Dysrhythmia    RBBB on 06-06-17 ekg   . Elevated liver function tests   . Esophageal stricture   . Family history of breast cancer   . Gallstones   . Genetic testing 04/02/2018   STAT Breast panel with reflex to Multi-Cancer panel (83 genes) @ [facility] - No pathogenic mutations detected  . GERD (gastroesophageal reflux disease)   . Hiatal hernia   . History of kidney stones   . History of radiation therapy 07/09/2018- 08/03/18   Left Breast/ 40.05 Gy in 15 fractions. Left Breast boost 10 Gy in 5 fractions.   . Hyperlipemia   . Hypothyroidism   . Insomnia   . Malignant neoplasm of upper-outer quadrant of left female breast ([place])   . Nephrolithiasis   . Pneumonia 2015   aspirated after colonosopy  . Renal cyst     PAST SURGICAL HISTORY: Past Surgical History:  Procedure Laterality Date  . BREAST BIOPSY Left 05/31/2019   mri  . BREAST BIOPSY Left 04/2019   malignant  . BREAST LUMPECTOMY Left 04/17/2018   malignant  . BREAST LUMPECTOMY WITH RADIOACTIVE SEED AND SENTINEL LYMPH NODE BIOPSY Left 04/17/2018   Procedure: LEFT BREAST LUMPECTOMY WITH BRACKETED RADIOACTIVE SEEDS AND SENTINEL LYMPH NODE BIOPSY;  Surgeon: Rolm Bookbinder, MD;  Location: [facility];  Service: General;  Laterality: Left;  . BREAST SURGERY Left    Lumpectomy  . CARDIOVASCULAR STRESS TEST  07/19/2006   EF 70%, NO EVIDENCE OF ISCHEMIA  . CHOLECYSTECTOMY  12/30/10  . COLONOSCOPY    . DILATION AND CURETTAGE OF UTERUS    . HYSTEROSCOPY WITH D & C N/A 06/07/2018   Procedure: DILATATION AND CURETTAGE /HYSTEROSCOPY;  Surgeon: [name], MD;  Location: [facility];  Service: Gynecology;  Laterality: N/A;  . inguinal herniography    . NASAL SINUS SURGERY    . RADIOACTIVE SEED GUIDED EXCISIONAL BREAST BIOPSY Right 04/17/2018   Procedure: RIGHT BREAST SEED GUIDED EXCISIONAL BIOPSY;  Surgeon: Rolm Bookbinder, MD;  Location: [facility];  Service: General;  Laterality: Right;  . RE-EXCISION OF BREAST LUMPECTOMY Left 05/15/2018   Procedure: RE-EXCISION OF LEFT BREAST LUMPECTOMY, ASPIRATION OF LEFT AXILLARY SEROMA;  Surgeon: Rolm Bookbinder, MD;  Location: [facility];  Service: General;  Laterality: Left;  . T & A    . TOOTH EXTRACTION    . TOTAL KNEE ARTHROPLASTY Left 09/11/2017   Procedure: LEFT TOTAL KNEE ARTHROPLASTY;  Surgeon: Gaynelle Arabian, MD;  Location: [facility];  Service: Orthopedics;  Laterality: Left;  with block  . US ECHOCARDIOGRAPHY  07/17/2006   EF 55-60%    FAMILY HISTORY Family History  Problem Relation Age of Onset  . Breast cancer Mother 73       deceased 51; TAH/BSO in 47s/50s  . Dementia Father        deceased 70  . Breast cancer Maternal Aunt 82       deceased 60s  . Colon cancer Neg Hx   . Stomach cancer Neg Hx   Her father died of old age at 39 years old. Her mother had breast cancer at 59 and died at 75 years old. She will  undergo genetic testing. Her maternal aunt had breast cancer at 16 years old. No family history of ovarian cancer. She had one brother and one sister.    GYNECOLOGIC HISTORY:  No LMP recorded. Patient is postmenopausal. Menarche: 75 years old [place] P 0 LMP: 1997 Contraceptive: 2 years HRT: Estrogen and progesterone for about 7 years  Hysterectomy? No   SOCIAL HISTORY:  She is a retired Futures trader. She has been married to [place], for 45 plus years. He worked in the [employer]. Together they adopted two children. One daughter, [name], 63 of [place], who is a Psychologist, sport and exercise for an OBGYN, and one son, Bristol Osentoski, 36 of [place], who owns a Marketing executive business. The patient has three grandchildren. She attends [employer].    ADVANCED DIRECTIVES: In place   HEALTH MAINTENANCE: Social History   Tobacco Use  . Smoking status: Never Smoker  . Smokeless tobacco: Never Used  Vaping Use  . Vaping  Use: Never used  Substance Use Topics  . Alcohol use: No  . Drug use: No    Colonoscopy: 2014/ [facility]  PAP: 2018  Bone density:  May [facility] at [facility] showed a T score of -0.9 normal   Allergies  Allergen Reactions  . Oxycontin [Oxycodone Hcl]     "it made me feel very high and hyper"   . Codeine Nausea And Vomiting    Current Outpatient Medications  Medication Sig Dispense Refill  . acetaminophen (TYLENOL) 500 MG tablet Take 500 mg by mouth every 6 (six) hours as needed for mild pain or headache. (Patient not taking: Reported on 02/17/2021)    . anastrozole (ARIMIDEX) 1 MG tablet Take 1 tablet (1 mg total) by mouth daily. 90 tablet 4  . azelastine (ASTELIN) 0.1 % nasal spray Place 1 spray into both nostrils 2 (two) times daily. Use in each nostril as directed    . Cholecalciferol (VITAMIN D3) 1000 units CAPS Take 1 capsule by mouth 2 (two) times daily.     Marland Kitchen escitalopram (LEXAPRO) 10 MG tablet Take 15 mg by mouth daily.    Marland Kitchen esomeprazole (NEXIUM) 40 MG capsule Take 40 mg by mouth 2 (two) times daily before a meal.    . exemestane (AROMASIN) 25 MG tablet Take 1 tablet (25 mg total) by mouth daily after breakfast. 90 tablet 4  . ezetimibe (ZETIA) 10 MG tablet Take 10 mg by mouth at bedtime.    . fluticasone (FLONASE) 50 MCG/ACT nasal spray Place 1 spray into both nostrils daily.    Marland Kitchen levothyroxine (SYNTHROID, LEVOTHROID) 50 MCG tablet Take 50 mcg by mouth at bedtime.    . Magnesium 250 MG TABS Take 1 tablet by mouth daily.     . naproxen sodium (ALEVE) 220 MG tablet Take 220 mg by mouth 2 (two) times daily as needed (pain).    Marland Kitchen OVER THE COUNTER MEDICATION Apply 1 application topically daily as needed (pain). Natural pain relief ointment    . Probiotic Product (PROBIOTIC ADVANCED PO) Take 1 tablet by mouth daily.    . rosuvastatin (CRESTOR) 5 MG tablet Take 5 mg by mouth at bedtime.     . sodium chloride (OCEAN) 0.65 % SOLN nasal spray Place 4 sprays into both nostrils 4 (four) times daily.     . SUPER B COMPLEX/C PO Take 1 tablet by mouth daily.    Marland Kitchen zolpidem (AMBIEN) 10 MG tablet zolpidem 10 mg tablet  take 1 tablet by mouth at bedtime if  needed     No current facility-administered medications for this visit.    OBJECTIVE: white woman  There were no vitals filed for this visit.   There is no height or weight on file to calculate BMI.   Wt Readings from Last 3 Encounters:  12/24/20 165 lb 3.2 oz (74.9 kg)  10/08/20 159 lb (72.1 kg)  08/04/20 157 lb 9.6 oz (71.5 kg)      ECOG FS:1 - Symptomatic but completely ambulatory  Telemedicine visit 04/05/2021   LAB RESULTS:  CMP     Component Value Date/Time   NA 140 01/23/2021 1644   NA 144 06/06/2017 1448   K 3.6 01/23/2021 1644   CL 106 01/23/2021 1644   CO2 23 01/23/2021 1644   GLUCOSE 116 (H) 01/23/2021 1644   BUN 12 01/23/2021 1644   BUN 12 06/06/2017 1448   CREATININE 0.85 01/23/2021 1644   CREATININE 0.80 11/26/2019 1024   CALCIUM 9.4 01/23/2021 1644   PROT 6.9 12/24/2020 0840   PROT 6.3 06/06/2017 1448   ALBUMIN 4.1 12/24/2020 0840   ALBUMIN 4.4 06/06/2017 1448   AST 20 12/24/2020 0840   AST 45 (H) 11/26/2019 1024   ALT 18 12/24/2020 0840   ALT 31 11/26/2019 1024   ALKPHOS 76 12/24/2020 0840   BILITOT 0.7 12/24/2020 0840   BILITOT 0.7 11/26/2019 1024   GFRNONAA >60 01/23/2021 1644   GFRNONAA >60 11/26/2019 1024   GFRAA >60 06/29/2020 1237   GFRAA >60 11/26/2019 1024    No results found for: TOTALPROTELP, ALBUMINELP, A1GS, A2GS, BETS, BETA2SER, GAMS, MSPIKE, SPEI  No results found for: KPAFRELGTCHN, LAMBDASER, KAPLAMBRATIO  Lab Results  Component Value Date   WBC 11.9 (H) 01/23/2021   NEUTROABS 3.6 12/24/2020   HGB 12.8 01/23/2021   HCT 40.1 01/23/2021   MCV 90.7 01/23/2021   PLT 259 01/23/2021   No results found for: LABCA2  No components found for: [id number]  No results for input(s): INR in the last 168 hours.  No results found for: LABCA2  No results found for: ELF810  No  results found for: FBP102  No results found for: HEN277  No results found for: CA2729  No components found for: HGQUANT  No results found for: CEA1 / No results found for: CEA1   No results found for: AFPTUMOR  No results found for: CHROMOGRNA  No results found for: HGBA, HGBA2QUANT, HGBFQUANT, HGBSQUAN (Hemoglobinopathy evaluation)   No results found for: LDH  No results found for: IRON, TIBC, IRONPCTSAT (Iron and TIBC)  No results found for: FERRITIN  Urinalysis    Component Value Date/Time   COLORURINE YELLOW 08/22/2016 1947   APPEARANCEUR CLEAR 08/22/2016 1947   LABSPEC 1.026 08/22/2016 1947   PHURINE 5.5 08/22/2016 1947   GLUCOSEU NEGATIVE 08/22/2016 1947   HGBUR SMALL (A) 08/22/2016 1947   BILIRUBINUR NEGATIVE 08/22/2016 [place] 08/22/2016 1947   PROTEINUR NEGATIVE 08/22/2016 1947   UROBILINOGEN 0.2 08/22/2016 1622   NITRITE NEGATIVE 08/22/2016 1947   LEUKOCYTESUR SMALL (A) 08/22/2016 1947    STUDIES: No results found.   ASSESSMENT: 75 y.o. [place] woman status post left breast upper outer quadrant biopsy 03/08/2018 for a clinical T1c-T2 N0, stage I invasive ductal carcinoma, grade 1 or 2, estrogen and progesterone receptor positive, HER-2 not amplified, with an MIB-1 of less than 1%  (1) additional biopsies as follows:  (a) right breast biopsy ([id number]) on 04/02/2018 showed atypical lobular hyperplasia  (b) left breast ([id number]) on 03/22/2018 showed  invasive lobular carcinoma, grade 2, estrogen and progesterone receptor positive with an MIB-1 of 5%. HER-2 not amplified  (2) bilateral lumpectomies and left sentinel lymph node sampling 04/17/2018 found  (a) on the right side, atypical lobular hyperplasia  (b) on the left side, an mpT1c pN0, stage IA, grade 2 invasive lobular carcinoma, with positive margins  (c) additional left breast surgery on 05/15/2018 cleared the compromise margins  (3) The Oncotype DX score was 14,  predicting a risk of outside the breast recurrence over the next 9 years of 4% if the patient's only systemic therapy is tamoxifen for 5 years.  It also predicts no significant benefit from chemotherapy.  (4) adjuvant radiation 07/09/2018 - 08/03/2018 Site/dose:    1. Left Breast / 40.05 Gy in 15 fractions 2. Left Breast Boost / 10 Gy in 5 fractions  (5) started tamoxifen 09/11/2018, discontinued June 2020 with cognitive issues   (6) genetics testing 03/22/2018 through [facility]'s STAT Breast panel with reflex to Multi-Cancer panel found no pathogenic mutations in (ALK, APC, ATM, AXIN2, BAP1, BARD1, BLM, BMPR1A, BRCA1, BRCA2, BRIP1, CASR, CDC73, CDH1, CDK4, CDKN1B, CDKN1C, CDKN2A, CEBPA, CHEK2, CTNNA1, DICER1, DIS3L2, EGFR, EPCAM, FH, FLCN, GATA2, GPC3, GREM1, HOXB13, HRAS, KIT, MAX, MEN1, MET, MITF, MLH1, MSH2, MSH3, MSH6, MUTYH, NBN, NF1, NF2, NTHL1, PALB2, PDGFRA, PHOX2B, PMS2, POLD1, POLE, POT1, PRKAR1A, PTCH1, PTEN, RAD50, RAD51C, RAD51D, RB1, RECQL4, RET, RUNX1, SDHA, SDHAF2, SDHB, SDHC, SDHD, SMAD4, SMARCA4, SMARCB1, SMARCE1, STK11, SUFU, TERC, TERT, TMEM127, TP53, TSC1, TSC2, VHL, WRN, WT1).  (a) Variants of Uncertain Significance were found in in PDGFRA and RECQL4.  (7) started letrozole 09/26/2019, discontinued 11/26/2019 with cognitive issues  (a) bone density May 2014 normal, T score -0.9  (b) letrozole resumed 2020-01-22, taken Mondays, Wednesday, Friday, as being off medication made no difference to either hot flashes or cognitive deficiencies  (c) letrozole discontinued October 2021 with multiple side effects  (8) exemestane prescribed 12/24/2020, discontinued mid March 2022 with intolerable side effect  (9) anastrozole to start 03/15/2021   PLAN: [name] is very motivated to get anastrozole to work for her.  It is what her sister takes and it is working well for her.  Of course she has tried other medications that have not worked so well for her.  The concern I have is that she  may be developing some arthralgias from this.  It takes a little longer to really know.  She had an appointment with me, virtual, late May, she but she would like to change that to in person so we could get some lab work.  I will be glad to do that.  If she proves unable to tolerate anastrozole we can get Faslodex and try   [name], Virgie Dad, MD  04/05/21 1:10 PM Medical Oncology and Hematology [facility] [place], [place] [address] Tel. [phone]    Fax. [phone]   I, Wilburn Mylar, am acting as scribe for Dr. Virgie Dad. [name].  I, Lurline Del MD, have reviewed the above documentation for accuracy and completeness, and I agree with the above.   *Total Encounter Time as defined by the Centers for Medicare and Medicaid Services includes, in addition to the face-to-face time of a patient visit (documented in the note above) non-face-to-face time: obtaining and reviewing outside history, ordering and reviewing medications, tests or procedures, care coordination (communications with other health care professionals or caregivers) and documentation in the medical record.

## 2021-04-05 ENCOUNTER — Inpatient Hospital Stay: Payer: Medicare Other | Attending: Oncology | Admitting: Oncology

## 2021-04-05 DIAGNOSIS — C50412 Malignant neoplasm of upper-outer quadrant of left female breast: Secondary | ICD-10-CM | POA: Diagnosis not present

## 2021-04-05 DIAGNOSIS — Z170 Estrogen receptor positive status [ER+]: Secondary | ICD-10-CM | POA: Diagnosis not present

## 2021-04-07 ENCOUNTER — Other Ambulatory Visit: Payer: Self-pay

## 2021-04-07 ENCOUNTER — Ambulatory Visit (HOSPITAL_BASED_OUTPATIENT_CLINIC_OR_DEPARTMENT_OTHER): Payer: Medicare Other | Attending: Internal Medicine | Admitting: Physical Therapy

## 2021-04-07 ENCOUNTER — Telehealth: Payer: Self-pay | Admitting: Oncology

## 2021-04-07 DIAGNOSIS — M6281 Muscle weakness (generalized): Secondary | ICD-10-CM | POA: Insufficient documentation

## 2021-04-07 DIAGNOSIS — G8929 Other chronic pain: Secondary | ICD-10-CM | POA: Diagnosis not present

## 2021-04-07 DIAGNOSIS — M25512 Pain in left shoulder: Secondary | ICD-10-CM | POA: Diagnosis not present

## 2021-04-07 DIAGNOSIS — L599 Disorder of the skin and subcutaneous tissue related to radiation, unspecified: Secondary | ICD-10-CM | POA: Diagnosis not present

## 2021-04-07 NOTE — Telephone Encounter (Signed)
Scheduled per 4/25 los. Called and spoke with pt confirmed added appts  

## 2021-04-08 ENCOUNTER — Encounter (HOSPITAL_BASED_OUTPATIENT_CLINIC_OR_DEPARTMENT_OTHER): Payer: Self-pay | Admitting: Physical Therapy

## 2021-04-08 NOTE — Therapy (Signed)
[place], [place], [address] Phone: [phone]   Fax:  [phone]  Physical Therapy Treatment  Patient Details  Name: [name] MRN: [id number] Date of Birth: 15-Apr-1946 Referring Provider (PT): Dr. Donne Hazel    Encounter Date: 04/07/2021   PT End of Session - 04/08/21 1029    Visit Number 3   3   Number of Visits --   12   Date for PT Re-Evaluation 04/29/21    Authorization Type MCR/[employer]    PT Start Time 1105    PT Stop Time 1145    PT Time Calculation (min) 40 min    Activity Tolerance Patient tolerated treatment well    Behavior During Therapy [facility] for tasks assessed/performed           Past Medical History:  Diagnosis Date  . Arthritis    hands and knees  . Complication of anesthesia    aspiration pna; following a colonoscopy, pt requests head of bed elevated if possible.  . Constipation   . Cough   . Depression   . Dysrhythmia    RBBB on 06-06-17 ekg   . Elevated liver function tests   . Esophageal stricture   . Family history of breast cancer   . Gallstones   . Genetic testing 04/02/2018   STAT Breast panel with reflex to Multi-Cancer panel (83 genes) @ [facility] - No pathogenic mutations detected  . GERD (gastroesophageal reflux disease)   . Hiatal hernia   . History of kidney stones   . History of radiation therapy 07/09/2018- 08/03/18   Left Breast/ 40.05 Gy in 15 fractions. Left Breast boost 10 Gy in 5 fractions.   . Hyperlipemia   . Hypothyroidism   . Insomnia   . Malignant neoplasm of upper-outer quadrant of left female breast ([place])   . Nephrolithiasis   . Pneumonia 2015   aspirated after colonosopy  . Renal cyst     Past Surgical History:  Procedure Laterality Date  . BREAST BIOPSY Left 05/31/2019   mri  . BREAST BIOPSY Left 04/2019   malignant  . BREAST LUMPECTOMY Left 04/17/2018   malignant  . BREAST LUMPECTOMY WITH RADIOACTIVE SEED AND SENTINEL LYMPH  NODE BIOPSY Left 04/17/2018   Procedure: LEFT BREAST LUMPECTOMY WITH BRACKETED RADIOACTIVE SEEDS AND SENTINEL LYMPH NODE BIOPSY;  Surgeon: Rolm Bookbinder, MD;  Location: [facility];  Service: General;  Laterality: Left;  . BREAST SURGERY Left    Lumpectomy  . CARDIOVASCULAR STRESS TEST  07/19/2006   EF 70%, NO EVIDENCE OF ISCHEMIA  . CHOLECYSTECTOMY  12/30/10  . COLONOSCOPY    . DILATION AND CURETTAGE OF UTERUS    . HYSTEROSCOPY WITH D & C N/A 06/07/2018   Procedure: DILATATION AND CURETTAGE /HYSTEROSCOPY;  Surgeon: [name], MD;  Location: [facility];  Service: Gynecology;  Laterality: N/A;  . inguinal herniography    . NASAL SINUS SURGERY    . RADIOACTIVE SEED GUIDED EXCISIONAL BREAST BIOPSY Right 04/17/2018   Procedure: RIGHT BREAST SEED GUIDED EXCISIONAL BIOPSY;  Surgeon: Rolm Bookbinder, MD;  Location: [facility];  Service: General;  Laterality: Right;  . RE-EXCISION OF BREAST LUMPECTOMY Left 05/15/2018   Procedure: RE-EXCISION OF LEFT BREAST LUMPECTOMY, ASPIRATION OF LEFT AXILLARY SEROMA;  Surgeon: Rolm Bookbinder, MD;  Location: [facility];  Service: General;  Laterality: Left;  . T & A    . TOOTH EXTRACTION    . TOTAL KNEE ARTHROPLASTY Left  09/11/2017   Procedure: LEFT TOTAL KNEE ARTHROPLASTY;  Surgeon: Gaynelle Arabian, MD;  Location: [facility];  Service: Orthopedics;  Laterality: Left;  with block  . US ECHOCARDIOGRAPHY  07/17/2006   EF 55-60%    There were no vitals filed for this visit.   Subjective Assessment - 04/08/21 1026    Subjective Patient continues to preort she is no better, but she is doing more things. She got really painful after doing some gardening, but she was able to do some gardening. She has been working on her home exercises but she dosent have many for her hip and back.    Pertinent History Patient was diagnosed on 03/08/18 with left grade 1-2 invasive ductal carcinoma breast cancer.It measured 1.5 cm and is located  in the upper outer quadrant. It is ER/PR positive and HER2 negative with a Ki67 of < 1%.  She had a left total knee replacement in 10/18 with Dr. Wynelle Link and continues to have pain in her left lateral knee. Her orthopedist reported it is likely due to her iliotibial band. Patient reports she underwent a left lumpectomy and SNLB (0/4 nodes positive) removed on 04/17/18.She completed radiation, but no chemo    How long can you stand comfortably? limited time before she has pain    How long can you walk comfortably? depends on the day    Diagnostic tests MRI of lumbar spine: L4 disc buldge with possible L5 nerve root impingement    Patient Stated Goals to get rid of the left armpit and arm pain    Currently in Pain? Yes    Pain Score 5     Pain Location Back    Pain Orientation Right    Pain Descriptors / Indicators Aching    Pain Type Chronic pain    Pain Radiating Towards into her right buttock    Pain Onset More than a month ago    Pain Frequency Intermittent    Aggravating Factors  standing and walking    Pain Relieving Factors rest    Effect of Pain on Daily Activities difficulty perfroming ADL's    Multiple Pain Sites Yes                             [facility] Adult PT Treatment/Exercise - 04/08/21 0001      Knee/Hip Exercises: Seated   Other Seated Knee/Hip Exercises LAQ 2x10 yellow band right only; clamshell 2x10 yellow; seated march 2x10 yellow      Knee/Hip Exercises: Supine   Quad Sets Limitations x10 5 sec hold    Other Supine Knee/Hip Exercises supine decompression leg lengthener x10 mod cuing for techniqure      Shoulder Exercises: Seated   Other Seated Exercises bilateral er in low range 2x10 yellow      Manual Therapy   Manual Therapy Soft tissue mobilization;Edema management    Manual therapy comments patellar mobiliation    Soft tissue mobilization trigger point release to lateral glute and lower lumbar spine; signifcant improvement noted between     Manual Traction LAD grade II and III ocicaltions to bilateral hips                  PT Education - 04/08/21 1029    Education Details reviewed decompression    Person(s) Educated Patient    Methods Explanation;Demonstration;Tactile cues;Verbal cues    Comprehension Verbalized understanding;Returned demonstration;Verbal cues required;Tactile cues required  PT Short Term Goals - 04/02/21 1240      PT SHORT TERM GOAL #1   Baseline 6/10, 04/02/2021 5/10    Status Achieved      PT SHORT TERM GOAL #2   Title Pt will report she is doing a beginning home exericse program for postural and strength exercises    Status Achieved             PT Long Term Goals - 04/02/21 1230      PT LONG TERM GOAL #1   Title Pt will have painfree left shoulder abduction of 145 degrees so that she can reach into a kitchen cabinet at home    Baseline 134 degrees with 6/10 pain on 02/17/2021    Time 4    Period Weeks    Status On-going      PT LONG TERM GOAL #2   Title Pt will be independent in progressive resistive home exercise program for strength and fitness    Baseline pt is limited in progress of exericse program by pain    Time 4    Period Weeks    Status On-going      PT LONG TERM GOAL #3   Title Pt will report that her overall pain and discomfort is decreased by 50%    Time 4    Period Weeks    Status On-going                 Plan - 04/07/21 1500    Clinical Impression Statement Patient tolerated treatment well. Therapy expanded her home exercises to 4 things to do in sitting. She was advised to continue with the quad set at home as well. We removed the SLR as she cant omplete it. We also gave her a leg lengthener and bilateral er. she may require more cuing for leg lengthenr. therapy was able to put more pressure into her trigger points today. Her husband also reports she is getting up better in the morning. She was able to garden but had pain. She was encouraged  to remmeber that she was at least able to do it. She was also remiend of her deep breathing relaxation exercises.    Personal Factors and Comorbidities Comorbidity 3+    Comorbidities Previous Lt TKR with persistent pain, Previous Lt breast lumpectomy and radiation, right sciatic pain    Examination-Activity Limitations Carry;Lift    Stability/Clinical Decision Making Evolving/Moderate complexity    Clinical Decision Making Moderate    Rehab Potential Good    Clinical Impairments Affecting Rehab Potential pain in left leg, pain in right leg, decreased tolerance of gait due to pain,    PT Frequency 1x / week    PT Duration 4 weeks    PT Treatment/Interventions ADLs/Self Care Home Management;Therapeutic activities;Therapeutic exercise;Patient/family education;Manual techniques;Manual lymph drainage;Scar mobilization;Passive range of motion;Moist Heat;Balance training;Functional mobility training;Orthotic Fit/Training;Taping;Compression bandaging;Aquatic Therapy;Cryotherapy;Neuromuscular re-education;Dry needling    PT Next Visit Plan cancer PT : exericises for postural retraining and strengthening manual work for soft tissue mobilizaion and myofascial release to tight tissue on left chest, ...........(ortho) continue with manual therapy to right hip. Progress strengthening of bilateral LE; progress to standing exercises as tolerated.    PT Home Exercise Plan quad set, SLR,  prifriformis stretch; hamstring stretch;    Consulted and Agree with Plan of Care Patient           Patient will benefit from skilled therapeutic intervention in order to improve the following deficits and impairments:  Decreased knowledge of precautions,Impaired UE functional use,Decreased range of motion,Postural dysfunction,Decreased scar mobility,Decreased mobility,Pain,Increased fascial restricitons,Increased edema,Abnormal gait,Difficulty walking,Decreased endurance,Decreased activity tolerance,Impaired perceived functional  ability,Decreased knowledge of use of DME,Decreased strength  Visit Diagnosis: Chronic left shoulder pain  Muscle weakness (generalized)  Disorder of the skin and subcutaneous tissue related to radiation, unspecified     Problem List Patient Active Problem List   Diagnosis Date Noted  . Breast mass, right 03/27/2020  . Hepatic steatosis 01/22/2020  . Genetic testing 04/02/2018  . Family history of breast cancer   . Malignant neoplasm of upper-outer quadrant of left breast in female, estrogen receptor positive ([place]) 03/16/2018  . OA (osteoarthritis) of knee 09/11/2017  . Infrapatellar bursitis of right knee 04/29/2014  . Pneumonia, aspiration ([place]) 12/28/2012  . HYPOTHYROIDISM 11/15/2010  . HYPERLIPIDEMIA 11/15/2010  . DEPRESSION 11/15/2010  . GERD 11/15/2010  . CONSTIPATION 11/15/2010  . GALLSTONES 11/15/2010  . RENAL CYST 11/15/2010  . INSOMNIA UNSPECIFIED 11/15/2010  . COUGH 11/15/2010  . ABDOMINAL PAIN OTHER SPECIFIED SITE 11/15/2010  . TRANSAMINASES, SERUM, ELEVATED 11/15/2010    Carney Living PT DPT  04/08/2021, 12:08 PM  [facility] [address] [place], [place], [address] Phone: [phone]   Fax:  [phone]  Name: SINCLAIR ARRAZOLA MRN: [id number] Date of Birth: [birth year]

## 2021-04-14 ENCOUNTER — Ambulatory Visit (HOSPITAL_BASED_OUTPATIENT_CLINIC_OR_DEPARTMENT_OTHER): Payer: Medicare Other | Admitting: Physical Therapy

## 2021-04-15 ENCOUNTER — Other Ambulatory Visit: Payer: Self-pay

## 2021-04-15 ENCOUNTER — Ambulatory Visit: Payer: Medicare Other | Attending: General Surgery | Admitting: Physical Therapy

## 2021-04-15 DIAGNOSIS — L599 Disorder of the skin and subcutaneous tissue related to radiation, unspecified: Secondary | ICD-10-CM | POA: Diagnosis not present

## 2021-04-15 DIAGNOSIS — M25512 Pain in left shoulder: Secondary | ICD-10-CM | POA: Insufficient documentation

## 2021-04-15 DIAGNOSIS — G8929 Other chronic pain: Secondary | ICD-10-CM | POA: Insufficient documentation

## 2021-04-15 DIAGNOSIS — M6281 Muscle weakness (generalized): Secondary | ICD-10-CM | POA: Diagnosis not present

## 2021-04-15 NOTE — Therapy (Signed)
[place], [place], [address] Phone: [phone]   Fax:  [phone]  Physical Therapy Treatment  Patient Details  Name: [name] MRN: [id number] Date of Birth: 1946/03/15 Referring Provider (PT): Dr. Donne Hazel    Encounter Date: 04/15/2021   PT End of Session - 04/15/21 1716    Visit Number 11    Number of Visits 17    Date for PT Re-Evaluation 04/29/21    PT Start Time 1500    PT Stop Time 1545    PT Time Calculation (min) 45 min    Activity Tolerance Patient tolerated treatment well    Behavior During Therapy [facility] for tasks assessed/performed           Past Medical History:  Diagnosis Date  . Arthritis    hands and knees  . Complication of anesthesia    aspiration pna; following a colonoscopy, pt requests head of bed elevated if possible.  . Constipation   . Cough   . Depression   . Dysrhythmia    RBBB on 06-06-17 ekg   . Elevated liver function tests   . Esophageal stricture   . Family history of breast cancer   . Gallstones   . Genetic testing 04/02/2018   STAT Breast panel with reflex to Multi-Cancer panel (83 genes) @ [facility] - No pathogenic mutations detected  . GERD (gastroesophageal reflux disease)   . Hiatal hernia   . History of kidney stones   . History of radiation therapy 07/09/2018- 08/03/18   Left Breast/ 40.05 Gy in 15 fractions. Left Breast boost 10 Gy in 5 fractions.   . Hyperlipemia   . Hypothyroidism   . Insomnia   . Malignant neoplasm of upper-outer quadrant of left female breast ([place])   . Nephrolithiasis   . Pneumonia 2015   aspirated after colonosopy  . Renal cyst     Past Surgical History:  Procedure Laterality Date  . BREAST BIOPSY Left 05/31/2019   mri  . BREAST BIOPSY Left 04/2019   malignant  . BREAST LUMPECTOMY Left 04/17/2018   malignant  . BREAST LUMPECTOMY WITH RADIOACTIVE SEED AND SENTINEL LYMPH NODE BIOPSY Left 04/17/2018   Procedure:  LEFT BREAST LUMPECTOMY WITH BRACKETED RADIOACTIVE SEEDS AND SENTINEL LYMPH NODE BIOPSY;  Surgeon: Rolm Bookbinder, MD;  Location: [facility];  Service: General;  Laterality: Left;  . BREAST SURGERY Left    Lumpectomy  . CARDIOVASCULAR STRESS TEST  07/19/2006   EF 70%, NO EVIDENCE OF ISCHEMIA  . CHOLECYSTECTOMY  12/30/10  . COLONOSCOPY    . DILATION AND CURETTAGE OF UTERUS    . HYSTEROSCOPY WITH D & C N/A 06/07/2018   Procedure: DILATATION AND CURETTAGE /HYSTEROSCOPY;  Surgeon: [name], MD;  Location: [facility];  Service: Gynecology;  Laterality: N/A;  . inguinal herniography    . NASAL SINUS SURGERY    . RADIOACTIVE SEED GUIDED EXCISIONAL BREAST BIOPSY Right 04/17/2018   Procedure: RIGHT BREAST SEED GUIDED EXCISIONAL BIOPSY;  Surgeon: Rolm Bookbinder, MD;  Location: [facility];  Service: General;  Laterality: Right;  . RE-EXCISION OF BREAST LUMPECTOMY Left 05/15/2018   Procedure: RE-EXCISION OF LEFT BREAST LUMPECTOMY, ASPIRATION OF LEFT AXILLARY SEROMA;  Surgeon: Rolm Bookbinder, MD;  Location: [facility];  Service: General;  Laterality: Left;  . T & A    . TOOTH EXTRACTION    . TOTAL KNEE ARTHROPLASTY Left 09/11/2017   Procedure: LEFT TOTAL KNEE ARTHROPLASTY;  Surgeon:  Gaynelle Arabian, MD;  Location: [facility];  Service: Orthopedics;  Laterality: Left;  with block  . US ECHOCARDIOGRAPHY  07/17/2006   EF 55-60%    There were no vitals filed for this visit.   Subjective Assessment - 04/15/21 1554    Subjective Pt reports that she did the exercises with the theraband that she got last visit and she had so much pain she could barely move. She is looking forward to doing aquatic exercise    Pertinent History Patient was diagnosed on 03/08/18 with left grade 1-2 invasive ductal carcinoma breast cancer.It measured 1.5 cm and is located in the upper outer quadrant. It is ER/PR positive and HER2 negative with a Ki67 of < 1%.  She had a left total knee  replacement in 10/18 with Dr. Wynelle Link and continues to have pain in her left lateral knee. Her orthopedist reported it is likely due to her iliotibial band. Patient reports she underwent a left lumpectomy and SNLB (0/4 nodes positive) removed on 04/17/18.She completed radiation, but no chemo    How long can you stand comfortably? limited time before she has pain    How long can you walk comfortably? depends on the day    Diagnostic tests MRI of lumbar spine: L4 disc buldge with possible L5 nerve root impingement    Patient Stated Goals to get rid of the left armpit and arm pain    Currently in Pain? Yes    Pain Score --   did not rate                            [facility] Adult PT Treatment/Exercise - 04/15/21 0001      Shoulder Exercises: Supine   Other Supine Exercises chest press 5 reps with no weights    Other Supine Exercises with arm pio      Manual Therapy   Manual Therapy Soft tissue mobilization;Edema management    Soft tissue mobilization with coconut butter in supine to Left pectoralis, border of axilla, left trunk area, left incisions and lats, then to sidelying for work on external rotators and posterior shoulder.                    PT Short Term Goals - 04/02/21 1240      PT SHORT TERM GOAL #1   Baseline 6/10, 04/02/2021 5/10    Status Achieved      PT SHORT TERM GOAL #2   Title Pt will report she is doing a beginning home exericse program for postural and strength exercises    Status Achieved             PT Long Term Goals - 04/02/21 1230      PT LONG TERM GOAL #1   Title Pt will have painfree left shoulder abduction of 145 degrees so that she can reach into a kitchen cabinet at home    Baseline 134 degrees with 6/10 pain on 02/17/2021    Time 4    Period Weeks    Status On-going      PT LONG TERM GOAL #2   Title Pt will be independent in progressive resistive home exercise program for strength and fitness    Baseline pt is limited in  progress of exericse program by pain    Time 4    Period Weeks    Status On-going      PT LONG TERM GOAL #3  Title Pt will report that her overall pain and discomfort is decreased by 50%    Time 4    Period Weeks    Status On-going                 Plan - 04/15/21 1556    Clinical Impression Statement Pt comes into PT with foward flexed posture, head down, leaning heavily on her cane.  She is distressed about a family matter and disappointed that her pain is not better. She did say that she was able to fill a large pot with soil from a wheelbarrel and plant a tomato today so she is trying to do things to make herself feel better. She plans to go to the fitness center at her club that will be opening soon.  She was able to do some supine body weight exercises with arms today without difficulty and will make an effort to do those every day to see if she can get stronger to tolerate more resistance exercise    Comorbidities Previous Lt TKR with persistent pain, Previous Lt breast lumpectomy and radiation, right sciatic pain    Examination-Activity Limitations Carry;Lift    Stability/Clinical Decision Making Evolving/Moderate complexity    Rehab Potential Good    Clinical Impairments Affecting Rehab Potential pain in left leg, pain in right leg, decreased tolerance of gait due to pain,    PT Frequency 1x / week    PT Duration 4 weeks    PT Treatment/Interventions ADLs/Self Care Home Management;Therapeutic activities;Therapeutic exercise;Patient/family education;Manual techniques;Manual lymph drainage;Scar mobilization;Passive range of motion;Moist Heat;Balance training;Functional mobility training;Orthotic Fit/Training;Taping;Compression bandaging;Aquatic Therapy;Cryotherapy;Neuromuscular re-education;Dry needling    PT Next Visit Plan cancer PT : exericises for postural retraining and strengthening manual work for soft tissue mobilizaion and myofascial release to tight tissue on left  chest, ...........(ortho) continue with manual therapy to right hip. Progress strengthening of bilateral LE; progress to standing exercises as tolerated.    Consulted and Agree with Plan of Care Patient           Patient will benefit from skilled therapeutic intervention in order to improve the following deficits and impairments:  Decreased knowledge of precautions,Impaired UE functional use,Decreased range of motion,Postural dysfunction,Decreased scar mobility,Decreased mobility,Pain,Increased fascial restricitons,Increased edema,Abnormal gait,Difficulty walking,Decreased endurance,Decreased activity tolerance,Impaired perceived functional ability,Decreased knowledge of use of DME,Decreased strength  Visit Diagnosis: Chronic left shoulder pain  Muscle weakness (generalized)  Disorder of the skin and subcutaneous tissue related to radiation, unspecified     Problem List Patient Active Problem List   Diagnosis Date Noted  . Breast mass, right 03/27/2020  . Hepatic steatosis 01/22/2020  . Genetic testing 04/02/2018  . Family history of breast cancer   . Malignant neoplasm of upper-outer quadrant of left breast in female, estrogen receptor positive ([place]) 03/16/2018  . OA (osteoarthritis) of knee 09/11/2017  . Infrapatellar bursitis of right knee 04/29/2014  . Pneumonia, aspiration ([place]) 12/28/2012  . HYPOTHYROIDISM 11/15/2010  . HYPERLIPIDEMIA 11/15/2010  . DEPRESSION 11/15/2010  . GERD 11/15/2010  . CONSTIPATION 11/15/2010  . GALLSTONES 11/15/2010  . RENAL CYST 11/15/2010  . INSOMNIA UNSPECIFIED 11/15/2010  . COUGH 11/15/2010  . ABDOMINAL PAIN OTHER SPECIFIED SITE 11/15/2010  . TRANSAMINASES, SERUM, ELEVATED 11/15/2010    Donato Heinz. Owens Shark PT  Norwood Levo 04/15/2021, 5:18 PM  [place], [place], [address] Phone: [phone]   Fax:  [phone]  Name: [name] MRN:  [id number] Date of Birth: [birth year]

## 2021-04-16 DIAGNOSIS — R739 Hyperglycemia, unspecified: Secondary | ICD-10-CM | POA: Diagnosis not present

## 2021-04-16 DIAGNOSIS — R945 Abnormal results of liver function studies: Secondary | ICD-10-CM | POA: Diagnosis not present

## 2021-04-16 DIAGNOSIS — C50412 Malignant neoplasm of upper-outer quadrant of left female breast: Secondary | ICD-10-CM | POA: Diagnosis not present

## 2021-04-16 DIAGNOSIS — R413 Other amnesia: Secondary | ICD-10-CM | POA: Diagnosis not present

## 2021-04-16 DIAGNOSIS — M1990 Unspecified osteoarthritis, unspecified site: Secondary | ICD-10-CM | POA: Diagnosis not present

## 2021-04-16 DIAGNOSIS — M8580 Other specified disorders of bone density and structure, unspecified site: Secondary | ICD-10-CM | POA: Diagnosis not present

## 2021-04-16 DIAGNOSIS — E785 Hyperlipidemia, unspecified: Secondary | ICD-10-CM | POA: Diagnosis not present

## 2021-04-16 DIAGNOSIS — F329 Major depressive disorder, single episode, unspecified: Secondary | ICD-10-CM | POA: Diagnosis not present

## 2021-04-16 DIAGNOSIS — E039 Hypothyroidism, unspecified: Secondary | ICD-10-CM | POA: Diagnosis not present

## 2021-04-16 DIAGNOSIS — I700 Atherosclerosis of aorta: Secondary | ICD-10-CM | POA: Diagnosis not present

## 2021-04-16 DIAGNOSIS — E663 Overweight: Secondary | ICD-10-CM | POA: Diagnosis not present

## 2021-04-16 DIAGNOSIS — K760 Fatty (change of) liver, not elsewhere classified: Secondary | ICD-10-CM | POA: Diagnosis not present

## 2021-04-20 ENCOUNTER — Other Ambulatory Visit: Payer: Self-pay

## 2021-04-20 ENCOUNTER — Ambulatory Visit (HOSPITAL_BASED_OUTPATIENT_CLINIC_OR_DEPARTMENT_OTHER): Payer: Medicare Other | Attending: Internal Medicine | Admitting: Physical Therapy

## 2021-04-20 ENCOUNTER — Encounter (HOSPITAL_BASED_OUTPATIENT_CLINIC_OR_DEPARTMENT_OTHER): Payer: Self-pay | Admitting: Physical Therapy

## 2021-04-20 DIAGNOSIS — M25512 Pain in left shoulder: Secondary | ICD-10-CM | POA: Insufficient documentation

## 2021-04-20 DIAGNOSIS — M6281 Muscle weakness (generalized): Secondary | ICD-10-CM | POA: Insufficient documentation

## 2021-04-20 DIAGNOSIS — G8929 Other chronic pain: Secondary | ICD-10-CM | POA: Insufficient documentation

## 2021-04-20 DIAGNOSIS — L599 Disorder of the skin and subcutaneous tissue related to radiation, unspecified: Secondary | ICD-10-CM | POA: Diagnosis not present

## 2021-04-20 NOTE — Therapy (Signed)
[place], [place], [address] Phone: [phone]   Fax:  [phone]  Physical Therapy Treatment  Patient Details  Name: [name] MRN: [id number] Date of Birth: 1946/08/12 Referring Provider (PT): Dr. Donne Hazel    Encounter Date: 04/20/2021   PT End of Session - 04/20/21 1357    Visit Number 12    Number of Visits 17    Date for PT Re-Evaluation 04/29/21    Authorization Type MCR/[employer]    Authorization - Visit Number 2    Authorization - Number of Visits 12    PT Start Time 6389    PT Stop Time 1458    PT Time Calculation (min) 43 min    Activity Tolerance Patient tolerated treatment well    Behavior During Therapy [facility] for tasks assessed/performed           Past Medical History:  Diagnosis Date  . Arthritis    hands and knees  . Complication of anesthesia    aspiration pna; following a colonoscopy, pt requests head of bed elevated if possible.  . Constipation   . Cough   . Depression   . Dysrhythmia    RBBB on 06-06-17 ekg   . Elevated liver function tests   . Esophageal stricture   . Family history of breast cancer   . Gallstones   . Genetic testing 04/02/2018   STAT Breast panel with reflex to Multi-Cancer panel (83 genes) @ [facility] - No pathogenic mutations detected  . GERD (gastroesophageal reflux disease)   . Hiatal hernia   . History of kidney stones   . History of radiation therapy 07/09/2018- 08/03/18   Left Breast/ 40.05 Gy in 15 fractions. Left Breast boost 10 Gy in 5 fractions.   . Hyperlipemia   . Hypothyroidism   . Insomnia   . Malignant neoplasm of upper-outer quadrant of left female breast ([place])   . Nephrolithiasis   . Pneumonia 2015   aspirated after colonosopy  . Renal cyst     Past Surgical History:  Procedure Laterality Date  . BREAST BIOPSY Left 05/31/2019   mri  . BREAST BIOPSY Left 04/2019   malignant  . BREAST LUMPECTOMY Left 04/17/2018    malignant  . BREAST LUMPECTOMY WITH RADIOACTIVE SEED AND SENTINEL LYMPH NODE BIOPSY Left 04/17/2018   Procedure: LEFT BREAST LUMPECTOMY WITH BRACKETED RADIOACTIVE SEEDS AND SENTINEL LYMPH NODE BIOPSY;  Surgeon: Rolm Bookbinder, MD;  Location: [place];  Service: General;  Laterality: Left;  . BREAST SURGERY Left    Lumpectomy  . CARDIOVASCULAR STRESS TEST  07/19/2006   EF 70%, NO EVIDENCE OF ISCHEMIA  . CHOLECYSTECTOMY  12/30/10  . COLONOSCOPY    . DILATION AND CURETTAGE OF UTERUS    . HYSTEROSCOPY WITH D & C N/A 06/07/2018   Procedure: DILATATION AND CURETTAGE /HYSTEROSCOPY;  Surgeon: [name], MD;  Location: [facility];  Service: Gynecology;  Laterality: N/A;  . inguinal herniography    . NASAL SINUS SURGERY    . RADIOACTIVE SEED GUIDED EXCISIONAL BREAST BIOPSY Right 04/17/2018   Procedure: RIGHT BREAST SEED GUIDED EXCISIONAL BIOPSY;  Surgeon: Rolm Bookbinder, MD;  Location: [place];  Service: General;  Laterality: Right;  . RE-EXCISION OF BREAST LUMPECTOMY Left 05/15/2018   Procedure: RE-EXCISION OF LEFT BREAST LUMPECTOMY, ASPIRATION OF LEFT AXILLARY SEROMA;  Surgeon: Rolm Bookbinder, MD;  Location: [place];  Service: General;  Laterality: Left;  . T & A    .  TOOTH EXTRACTION    . TOTAL KNEE ARTHROPLASTY Left 09/11/2017   Procedure: LEFT TOTAL KNEE ARTHROPLASTY;  Surgeon: Gaynelle Arabian, MD;  Location: [facility];  Service: Orthopedics;  Laterality: Left;  with block  . US ECHOCARDIOGRAPHY  07/17/2006   EF 55-60%    There were no vitals filed for this visit.   Subjective Assessment - 04/20/21 1353    Subjective Patient reports the sciatica comes and goes. The knee pain has been constant. She has stopped doing some of the band exercises.    Pertinent History Patient was diagnosed on 03/08/18 with left grade 1-2 invasive ductal carcinoma breast cancer.It measured 1.5 cm and is located in the upper outer quadrant. It is ER/PR positive and  HER2 negative with a Ki67 of < 1%.  She had a left total knee replacement in 10/18 with Dr. Wynelle Link and continues to have pain in her left lateral knee. Her orthopedist reported it is likely due to her iliotibial band. Patient reports she underwent a left lumpectomy and SNLB (0/4 nodes positive) removed on 04/17/18.She completed radiation, but no chemo    Limitations Standing;Walking    How long can you stand comfortably? limited time before she has pain    How long can you walk comfortably? depends on the day    Diagnostic tests MRI of lumbar spine: L4 disc buldge with possible L5 nerve root impingement    Patient Stated Goals to get rid of the left armpit and arm pain    Currently in Pain? Yes    Pain Score 6     Pain Location Back    Pain Orientation Right    Pain Descriptors / Indicators Aching    Pain Type Chronic pain    Pain Radiating Towards inot her right buttcok    Pain Onset More than a month ago    Pain Frequency Intermittent    Aggravating Factors  standing and walking    Pain Relieving Factors rest    Effect of Pain on Daily Activities difficulty perfroming ADL;s                             [facility] Adult PT Treatment/Exercise - 04/20/21 0001      Self-Care   Other Self-Care Comments  reviewed and updated HEP. Patient encouraged to continue with current exercises      Lumbar Exercises: Stretches   Lower Trunk Rotation Other (comment)   x20     Knee/Hip Exercises: Stretches   Other Knee/Hip Stretches LTR 2x10 in limited positions      Knee/Hip Exercises: Supine   Other Supine Knee/Hip Exercises posterior pelvic tilt 2x10    Other Supine Knee/Hip Exercises bilateral hip flexion 2x10; bridging 2x10      Manual Therapy   Manual therapy comments heat and creep of the patella;    Soft tissue mobilization trigger point into the gluteal and lower back.    Manual Traction LAD grade II and III ocicaltions to bilateral hips                  PT  Education - 04/20/21 1356    Education Details reviewe HEP and symptom mangement    Person(s) Educated Patient    Methods Explanation;Demonstration;Verbal cues;Tactile cues    Comprehension Verbalized understanding;Returned demonstration;Verbal cues required;Tactile cues required            PT Short Term Goals - 04/02/21 1240  PT SHORT TERM GOAL #1   Baseline 6/10, 04/02/2021 5/10    Status Achieved      PT SHORT TERM GOAL #2   Title Pt will report she is doing a beginning home exericse program for postural and strength exercises    Status Achieved             PT Long Term Goals - 04/02/21 1230      PT LONG TERM GOAL #1   Title Pt will have painfree left shoulder abduction of 145 degrees so that she can reach into a kitchen cabinet at home    Baseline 134 degrees with 6/10 pain on 02/17/2021    Time 4    Period Weeks    Status On-going      PT LONG TERM GOAL #2   Title Pt will be independent in progressive resistive home exercise program for strength and fitness    Baseline pt is limited in progress of exericse program by pain    Time 4    Period Weeks    Status On-going      PT LONG TERM GOAL #3   Title Pt will report that her overall pain and discomfort is decreased by 50%    Time 4    Period Weeks    Status On-going                 Plan - 04/20/21 1441    Clinical Impression Statement Patient is making progress She is tolerating increased pressure to her hip and lumbar spine. Her knee is still painful. We tried a heat and creep mobilization. She already has a good imtrovement in knee motion. She continues to have pokcets of swelling in her knee. She continues to have pain in her knee with basic exercises. She was encouraged to continue the exercises depsite the pain. she has also started walking more frequntly. She has had short periods of time without sciatic pain. This is very positive She was encouraged that this is a very good sign.    Personal  Factors and Comorbidities Comorbidity 3+    Comorbidities Previous Lt TKR with persistent pain, Previous Lt breast lumpectomy and radiation, right sciatic pain    Examination-Activity Limitations Carry;Lift    Stability/Clinical Decision Making Evolving/Moderate complexity    Clinical Decision Making Moderate    Rehab Potential Good    Clinical Impairments Affecting Rehab Potential pain in left leg, pain in right leg, decreased tolerance of gait due to pain,    PT Frequency 1x / week    PT Duration 4 weeks    PT Treatment/Interventions ADLs/Self Care Home Management;Therapeutic activities;Therapeutic exercise;Patient/family education;Manual techniques;Manual lymph drainage;Scar mobilization;Passive range of motion;Moist Heat;Balance training;Functional mobility training;Orthotic Fit/Training;Taping;Compression bandaging;Aquatic Therapy;Cryotherapy;Neuromuscular re-education;Dry needling    PT Next Visit Plan cancer PT : exericises for postural retraining and strengthening manual work for soft tissue mobilizaion and myofascial release to tight tissue on left chest, ...........(ortho) continue with manual therapy to right hip. Progress strengthening of bilateral LE; progress to standing exercises as tolerated. aquatics next visit.    PT Home Exercise Plan quad set, SLR,  prifriformis stretch; hamstring stretch;    Consulted and Agree with Plan of Care Patient           Patient will benefit from skilled therapeutic intervention in order to improve the following deficits and impairments:  Decreased knowledge of precautions,Impaired UE functional use,Decreased range of motion,Postural dysfunction,Decreased scar mobility,Decreased mobility,Pain,Increased fascial restricitons,Increased edema,Abnormal gait,Difficulty walking,Decreased endurance,Decreased activity tolerance,Impaired  perceived functional ability,Decreased knowledge of use of DME,Decreased strength  Visit Diagnosis: Chronic left shoulder  pain  Muscle weakness (generalized)  Disorder of the skin and subcutaneous tissue related to radiation, unspecified     Problem List Patient Active Problem List   Diagnosis Date Noted  . Breast mass, right 03/27/2020  . Hepatic steatosis 01/22/2020  . Genetic testing 04/02/2018  . Family history of breast cancer   . Malignant neoplasm of upper-outer quadrant of left breast in female, estrogen receptor positive ([place]) 03/16/2018  . OA (osteoarthritis) of knee 09/11/2017  . Infrapatellar bursitis of right knee 04/29/2014  . Pneumonia, aspiration ([place]) 12/28/2012  . HYPOTHYROIDISM 11/15/2010  . HYPERLIPIDEMIA 11/15/2010  . DEPRESSION 11/15/2010  . GERD 11/15/2010  . CONSTIPATION 11/15/2010  . GALLSTONES 11/15/2010  . RENAL CYST 11/15/2010  . INSOMNIA UNSPECIFIED 11/15/2010  . COUGH 11/15/2010  . ABDOMINAL PAIN OTHER SPECIFIED SITE 11/15/2010  . TRANSAMINASES, SERUM, ELEVATED 11/15/2010    Carney Living PT DPT  04/20/2021, 8:58 PM  [facility] [address] [place], [place], [address] Phone: [phone]   Fax:  [phone]  Name: [name] MRN: [id number] Date of Birth: 12/17/1945

## 2021-04-20 NOTE — Patient Instructions (Signed)
Access Code: [email or web address] URL: [email or web address] Date: 04/20/2021 Prepared by: Carolyne Littles  Exercises Supine Lower Trunk Rotation - 1 x daily - 7 x weekly - 3 sets - 10 reps Seated Hamstring Stretch - 1 x daily - 7 x weekly - 3 sets - 3 reps - 20 hold Supine Piriformis Stretch with Foot on Ground - 1 x daily - 7 x weekly - 3 sets - 3 reps - 20 hold Supine Quad Set - 1 x daily - 7 x weekly - 3 sets - 10 reps Supine March - 1 x daily - 7 x weekly - 3 sets - 10 reps Supine Posterior Pelvic Tilt - 1 x daily - 7 x weekly - 3 sets - 10 reps Hooklying Clamshell with Resistance - 1 x daily - 7 x weekly - 3 sets - 10 reps Seated Hip Abduction with Resistance - 1 x daily - 7 x weekly - 3 sets - 10 reps Seated March - 1 x daily - 7 x weekly - 3 sets - 10 reps

## 2021-04-21 ENCOUNTER — Ambulatory Visit: Payer: Medicare Other | Admitting: Physical Therapy

## 2021-04-21 DIAGNOSIS — M6281 Muscle weakness (generalized): Secondary | ICD-10-CM

## 2021-04-21 DIAGNOSIS — G8929 Other chronic pain: Secondary | ICD-10-CM

## 2021-04-21 DIAGNOSIS — L599 Disorder of the skin and subcutaneous tissue related to radiation, unspecified: Secondary | ICD-10-CM | POA: Diagnosis not present

## 2021-04-21 DIAGNOSIS — M25512 Pain in left shoulder: Secondary | ICD-10-CM | POA: Diagnosis not present

## 2021-04-21 NOTE — Therapy (Signed)
[place], [place], [address] Phone: [phone]   Fax:  [phone]  Physical Therapy Treatment  Patient Details  Name: [name] MRN: [id number] Date of Birth: 06/17/1946 Referring Provider (PT): Dr. Donne Hazel    Encounter Date: 04/21/2021   PT End of Session - 04/21/21 1609    Visit Number 12   for cancer rehab   Number of Visits 17    Date for PT Re-Evaluation 04/29/21    PT Start Time 1500    PT Stop Time 1555    PT Time Calculation (min) 55 min    Activity Tolerance Patient tolerated treatment well    Behavior During Therapy [facility] for tasks assessed/performed           Past Medical History:  Diagnosis Date  . Arthritis    hands and knees  . Complication of anesthesia    aspiration pna; following a colonoscopy, pt requests head of bed elevated if possible.  . Constipation   . Cough   . Depression   . Dysrhythmia    RBBB on 06-06-17 ekg   . Elevated liver function tests   . Esophageal stricture   . Family history of breast cancer   . Gallstones   . Genetic testing 04/02/2018   STAT Breast panel with reflex to Multi-Cancer panel (83 genes) @ [facility] - No pathogenic mutations detected  . GERD (gastroesophageal reflux disease)   . Hiatal hernia   . History of kidney stones   . History of radiation therapy 07/09/2018- 08/03/18   Left Breast/ 40.05 Gy in 15 fractions. Left Breast boost 10 Gy in 5 fractions.   . Hyperlipemia   . Hypothyroidism   . Insomnia   . Malignant neoplasm of upper-outer quadrant of left female breast ([place])   . Nephrolithiasis   . Pneumonia 2015   aspirated after colonosopy  . Renal cyst     Past Surgical History:  Procedure Laterality Date  . BREAST BIOPSY Left 05/31/2019   mri  . BREAST BIOPSY Left 04/2019   malignant  . BREAST LUMPECTOMY Left 04/17/2018   malignant  . BREAST LUMPECTOMY WITH RADIOACTIVE SEED AND SENTINEL LYMPH NODE BIOPSY Left  04/17/2018   Procedure: LEFT BREAST LUMPECTOMY WITH BRACKETED RADIOACTIVE SEEDS AND SENTINEL LYMPH NODE BIOPSY;  Surgeon: Rolm Bookbinder, MD;  Location: [place];  Service: General;  Laterality: Left;  . BREAST SURGERY Left    Lumpectomy  . CARDIOVASCULAR STRESS TEST  07/19/2006   EF 70%, NO EVIDENCE OF ISCHEMIA  . CHOLECYSTECTOMY  12/30/10  . COLONOSCOPY    . DILATION AND CURETTAGE OF UTERUS    . HYSTEROSCOPY WITH D & C N/A 06/07/2018   Procedure: DILATATION AND CURETTAGE /HYSTEROSCOPY;  Surgeon: [name], MD;  Location: [facility];  Service: Gynecology;  Laterality: N/A;  . inguinal herniography    . NASAL SINUS SURGERY    . RADIOACTIVE SEED GUIDED EXCISIONAL BREAST BIOPSY Right 04/17/2018   Procedure: RIGHT BREAST SEED GUIDED EXCISIONAL BIOPSY;  Surgeon: Rolm Bookbinder, MD;  Location: [place];  Service: General;  Laterality: Right;  . RE-EXCISION OF BREAST LUMPECTOMY Left 05/15/2018   Procedure: RE-EXCISION OF LEFT BREAST LUMPECTOMY, ASPIRATION OF LEFT AXILLARY SEROMA;  Surgeon: Rolm Bookbinder, MD;  Location: [place];  Service: General;  Laterality: Left;  . T & A    . TOOTH EXTRACTION    . TOTAL KNEE ARTHROPLASTY Left 09/11/2017   Procedure: LEFT TOTAL  KNEE ARTHROPLASTY;  Surgeon: Gaynelle Arabian, MD;  Location: [facility];  Service: Orthopedics;  Laterality: Left;  with block  . US ECHOCARDIOGRAPHY  07/17/2006   EF 55-60%    There were no vitals filed for this visit.   Subjective Assessment - 04/21/21 1508    Subjective Pt comes in without her cane today.  She plans to start aquatics soon for at least 2 sessionas    Pertinent History Patient was diagnosed on 03/08/18 with left grade 1-2 invasive ductal carcinoma breast cancer.It measured 1.5 cm and is located in the upper outer quadrant. It is ER/PR positive and HER2 negative with a Ki67 of < 1%.  She had a left total knee replacement in 10/18 with Dr. Wynelle Link and continues to have  pain in her left lateral knee. Her orthopedist reported it is likely due to her iliotibial band. Patient reports she underwent a left lumpectomy and SNLB (0/4 nodes positive) removed on 04/17/18.She completed radiation, but no chemo                             [facility] Adult PT Treatment/Exercise - 04/21/21 0001      Exercises   Other Exercises  deep breathing, neck flexion and active retraction, scapular circles for warm up      Shoulder Exercises: Supine   Protraction PROM;Right;Left;5 reps    External Rotation AROM;Left;Right;10 reps    Flexion AROM;Right;Left;5 reps    Other Supine Exercises wide controlled rotations      Manual Therapy   Manual Therapy Soft tissue mobilization;Edema management    Soft tissue mobilization with coconut butter in supine to Left pectoralis, border of axilla, left trunk area, left incisions and lats, then to sidelying for work on external rotators and posterior shoulder.                    PT Short Term Goals - 04/02/21 1240      PT SHORT TERM GOAL #1   Baseline 6/10, 04/02/2021 5/10    Status Achieved      PT SHORT TERM GOAL #2   Title Pt will report she is doing a beginning home exericse program for postural and strength exercises    Status Achieved             PT Long Term Goals - 04/02/21 1230      PT LONG TERM GOAL #1   Title Pt will have painfree left shoulder abduction of 145 degrees so that she can reach into a kitchen cabinet at home    Baseline 134 degrees with 6/10 pain on 02/17/2021    Time 4    Period Weeks    Status On-going      PT LONG TERM GOAL #2   Title Pt will be independent in progressive resistive home exercise program for strength and fitness    Baseline pt is limited in progress of exericse program by pain    Time 4    Period Weeks    Status On-going      PT LONG TERM GOAL #3   Title Pt will report that her overall pain and discomfort is decreased by 50%    Time 4    Period Weeks     Status On-going                 Plan - 04/21/21 1611    Clinical Impression Statement Pt appears to be  improving. She comes in walkng without her cane and says that she has started walking in her neighborhood. she has also started back with her gardening group and realized the importance of her socialization. She is hopeful she will cotinue to keep improving. She still has tightness in her left pec major and tenderness at ribs near area of seroma that seems to lessen a bit with soft tissue work. Pt reports she has less pain after treatment.    Personal Factors and Comorbidities Comorbidity 3+    Comorbidities Previous Lt TKR with persistent pain, Previous Lt breast lumpectomy and radiation, right sciatic pain    Examination-Activity Limitations Carry;Lift    Stability/Clinical Decision Making Evolving/Moderate complexity    Rehab Potential Good    Clinical Impairments Affecting Rehab Potential pain in left leg, pain in right leg, decreased tolerance of gait due to pain,    PT Frequency 1x / week    PT Duration 4 weeks    PT Treatment/Interventions ADLs/Self Care Home Management;Therapeutic activities;Therapeutic exercise;Patient/family education;Manual techniques;Manual lymph drainage;Scar mobilization;Passive range of motion;Moist Heat;Balance training;Functional mobility training;Orthotic Fit/Training;Taping;Compression bandaging;Aquatic Therapy;Cryotherapy;Neuromuscular re-education;Dry needling    PT Next Visit Plan cancer PT : exericises for postural retraining and strengthening manual work for soft tissue mobilizaion and myofascial release to tight tissue on left chest, ...........(ortho) continue with manual therapy to right hip. Progress strengthening of bilateral LE; progress to standing exercises as tolerated. aquatics next visit.    Consulted and Agree with Plan of Care Patient           Patient will benefit from skilled therapeutic intervention in order to improve the  following deficits and impairments:  Decreased knowledge of precautions,Impaired UE functional use,Decreased range of motion,Postural dysfunction,Decreased scar mobility,Decreased mobility,Pain,Increased fascial restricitons,Increased edema,Abnormal gait,Difficulty walking,Decreased endurance,Decreased activity tolerance,Impaired perceived functional ability,Decreased knowledge of use of DME,Decreased strength  Visit Diagnosis: Chronic left shoulder pain  Disorder of the skin and subcutaneous tissue related to radiation, unspecified  Muscle weakness (generalized)     Problem List Patient Active Problem List   Diagnosis Date Noted  . Breast mass, right 03/27/2020  . Hepatic steatosis 01/22/2020  . Genetic testing 04/02/2018  . Family history of breast cancer   . Malignant neoplasm of upper-outer quadrant of left breast in female, estrogen receptor positive ([place]) 03/16/2018  . OA (osteoarthritis) of knee 09/11/2017  . Infrapatellar bursitis of right knee 04/29/2014  . Pneumonia, aspiration ([place]) 12/28/2012  . HYPOTHYROIDISM 11/15/2010  . HYPERLIPIDEMIA 11/15/2010  . DEPRESSION 11/15/2010  . GERD 11/15/2010  . CONSTIPATION 11/15/2010  . GALLSTONES 11/15/2010  . RENAL CYST 11/15/2010  . INSOMNIA UNSPECIFIED 11/15/2010  . COUGH 11/15/2010  . ABDOMINAL PAIN OTHER SPECIFIED SITE 11/15/2010  . TRANSAMINASES, SERUM, ELEVATED 11/15/2010   Donato Heinz. Owens Shark PT  [place] 04/21/2021, [place], [place], [address] Phone: [phone]   Fax:  [phone]  Name: CHANCY CLAROS MRN: [id number] Date of Birth: 1946-09-22

## 2021-04-26 ENCOUNTER — Other Ambulatory Visit: Payer: Self-pay

## 2021-04-26 ENCOUNTER — Ambulatory Visit (HOSPITAL_BASED_OUTPATIENT_CLINIC_OR_DEPARTMENT_OTHER): Payer: Medicare Other | Admitting: Physical Therapy

## 2021-04-26 DIAGNOSIS — M6281 Muscle weakness (generalized): Secondary | ICD-10-CM

## 2021-04-26 DIAGNOSIS — L599 Disorder of the skin and subcutaneous tissue related to radiation, unspecified: Secondary | ICD-10-CM | POA: Diagnosis not present

## 2021-04-26 DIAGNOSIS — M25512 Pain in left shoulder: Secondary | ICD-10-CM | POA: Diagnosis not present

## 2021-04-26 DIAGNOSIS — G8929 Other chronic pain: Secondary | ICD-10-CM

## 2021-04-27 ENCOUNTER — Encounter (HOSPITAL_BASED_OUTPATIENT_CLINIC_OR_DEPARTMENT_OTHER): Payer: Self-pay | Admitting: Physical Therapy

## 2021-04-27 NOTE — Therapy (Signed)
[place], [place], [address] Phone: [phone]   Fax:  [phone]  Physical Therapy Treatment  Patient Details  Name: [name] MRN: [id number] Date of Birth: 1946/12/07 Referring Provider (PT): Dr. Donne Hazel    Encounter Date: 04/26/2021   PT End of Session - 04/27/21 0959    Visit Number 13   4th for ortho   Number of Visits 17    Date for PT Re-Evaluation 04/29/21    Authorization Type MCR/[employer]    Authorization - Visit Number 4    Authorization - Number of Visits 12    PT Start Time 9758    PT Stop Time 1557    PT Time Calculation (min) 42 min    Activity Tolerance Patient tolerated treatment well    Behavior During Therapy [facility] for tasks assessed/performed           Past Medical History:  Diagnosis Date  . Arthritis    hands and knees  . Complication of anesthesia    aspiration pna; following a colonoscopy, pt requests head of bed elevated if possible.  . Constipation   . Cough   . Depression   . Dysrhythmia    RBBB on 06-06-17 ekg   . Elevated liver function tests   . Esophageal stricture   . Family history of breast cancer   . Gallstones   . Genetic testing 04/02/2018   STAT Breast panel with reflex to Multi-Cancer panel (83 genes) @ [facility] - No pathogenic mutations detected  . GERD (gastroesophageal reflux disease)   . Hiatal hernia   . History of kidney stones   . History of radiation therapy 07/09/2018- 08/03/18   Left Breast/ 40.05 Gy in 15 fractions. Left Breast boost 10 Gy in 5 fractions.   . Hyperlipemia   . Hypothyroidism   . Insomnia   . Malignant neoplasm of upper-outer quadrant of left female breast ([place])   . Nephrolithiasis   . Pneumonia 2015   aspirated after colonosopy  . Renal cyst     Past Surgical History:  Procedure Laterality Date  . BREAST BIOPSY Left 05/31/2019   mri  . BREAST BIOPSY Left 04/2019   malignant  . BREAST LUMPECTOMY Left  04/17/2018   malignant  . BREAST LUMPECTOMY WITH RADIOACTIVE SEED AND SENTINEL LYMPH NODE BIOPSY Left 04/17/2018   Procedure: LEFT BREAST LUMPECTOMY WITH BRACKETED RADIOACTIVE SEEDS AND SENTINEL LYMPH NODE BIOPSY;  Surgeon: Rolm Bookbinder, MD;  Location: [facility];  Service: General;  Laterality: Left;  . BREAST SURGERY Left    Lumpectomy  . CARDIOVASCULAR STRESS TEST  07/19/2006   EF 70%, NO EVIDENCE OF ISCHEMIA  . CHOLECYSTECTOMY  12/30/10  . COLONOSCOPY    . DILATION AND CURETTAGE OF UTERUS    . HYSTEROSCOPY WITH D & C N/A 06/07/2018   Procedure: DILATATION AND CURETTAGE /HYSTEROSCOPY;  Surgeon: [name], MD;  Location: [facility];  Service: Gynecology;  Laterality: N/A;  . inguinal herniography    . NASAL SINUS SURGERY    . RADIOACTIVE SEED GUIDED EXCISIONAL BREAST BIOPSY Right 04/17/2018   Procedure: RIGHT BREAST SEED GUIDED EXCISIONAL BIOPSY;  Surgeon: Rolm Bookbinder, MD;  Location: [facility];  Service: General;  Laterality: Right;  . RE-EXCISION OF BREAST LUMPECTOMY Left 05/15/2018   Procedure: RE-EXCISION OF LEFT BREAST LUMPECTOMY, ASPIRATION OF LEFT AXILLARY SEROMA;  Surgeon: Rolm Bookbinder, MD;  Location: [facility];  Service: General;  Laterality: Left;  .  T & A    . TOOTH EXTRACTION    . TOTAL KNEE ARTHROPLASTY Left 09/11/2017   Procedure: LEFT TOTAL KNEE ARTHROPLASTY;  Surgeon: Gaynelle Arabian, MD;  Location: [facility];  Service: Orthopedics;  Laterality: Left;  with block  . US ECHOCARDIOGRAPHY  07/17/2006   EF 55-60%    There were no vitals filed for this visit.   Subjective Assessment - 04/27/21 0956    Subjective Patient continues to report knee pain but it hasn't changed despite increasing heractivity level. Her Sciatica is improving but it comes and goes. Pain level not assessed today. We are trying to be less pain focaused and more activity focused.    Pertinent History Patient was diagnosed on 03/08/18 with left grade  1-2 invasive ductal carcinoma breast cancer.It measured 1.5 cm and is located in the upper outer quadrant. It is ER/PR positive and HER2 negative with a Ki67 of < 1%.  She had a left total knee replacement in 10/18 with Dr. Wynelle Link and continues to have pain in her left lateral knee. Her orthopedist reported it is likely due to her iliotibial band. Patient reports she underwent a left lumpectomy and SNLB (0/4 nodes positive) removed on 04/17/18.She completed radiation, but no chemo    Limitations Standing;Walking    How long can you stand comfortably? limited time before she has pain    How long can you walk comfortably? depends on the day    Diagnostic tests MRI of lumbar spine: L4 disc buldge with possible L5 nerve root impingement    Patient Stated Goals to get rid of the left armpit and arm pain    Currently in Pain? --   per faces scale nothing significant                Pt seen for aquatic therapy today.  Treatment took place in water 3.25-4 ft in depth at the [facility] pool. Temp of water was 91.  Pt entered/exited the pool via stairs (step through pattern) independently with bilat rail.  Warm up: holding wall side stepping 4 laps; holding wall forward stepping 4 laps Gait training: worked 4 laps working on heel toe gait. Reviewed with patient how the depth of the water effects bouncy. She did heel raise at 3 different depths    Gait training at chest depth using heel toe gait 4 laps. Able to do without knee pain   Strengthening: sit to stand with technique 2x15; standing 4 way x20 each leg; noodle push x20; noodle push with pull x20; all performed at chest depth; squat with squat technique reviewed 3x10;     Pt requires buoyancy for support and to offload joints with strengthening exercises. Viscosity of the water is needed for resistance of strengthening; water current perturbations provides challenge to standing balance unsupported, requiring increased core  activation.                      PT Education - 04/27/21 0958    Education Details benefits of the pool and water    Person(s) Educated Patient    Methods Explanation;Demonstration;Verbal cues;Tactile cues    Comprehension Verbalized understanding;Returned demonstration;Verbal cues required;Tactile cues required            PT Short Term Goals - 04/02/21 1240      PT SHORT TERM GOAL #1   Baseline 6/10, 04/02/2021 5/10    Status Achieved      PT SHORT TERM GOAL #2   Title  Pt will report she is doing a beginning home exericse program for postural and strength exercises    Status Achieved             PT Long Term Goals - 04/02/21 1230      PT LONG TERM GOAL #1   Title Pt will have painfree left shoulder abduction of 145 degrees so that she can reach into a kitchen cabinet at home    Baseline 134 degrees with 6/10 pain on 02/17/2021    Time 4    Period Weeks    Status On-going      PT LONG TERM GOAL #2   Title Pt will be independent in progressive resistive home exercise program for strength and fitness    Baseline pt is limited in progress of exericse program by pain    Time 4    Period Weeks    Status On-going      PT LONG TERM GOAL #3   Title Pt will report that her overall pain and discomfort is decreased by 50%    Time 4    Period Weeks    Status On-going                 Plan - 04/27/21 1041    Clinical Impression Statement Patient tolerated the pool well. She was able to do advanced functioal activity such as squats and sit to stands, which she hasn't been able to do on the land. We focused more on the knee today but she did work on a little core. She was also shown how to do her stretches in the water. We will ofcus a little more on the sciatica next visit.    Comorbidities Previous Lt TKR with persistent pain, Previous Lt breast lumpectomy and radiation, right sciatic pain    Examination-Activity Limitations Carry;Lift     Stability/Clinical Decision Making Evolving/Moderate complexity    Clinical Decision Making Moderate    Rehab Potential Good    Clinical Impairments Affecting Rehab Potential pain in left leg, pain in right leg, decreased tolerance of gait due to pain,    PT Frequency 1x / week    PT Duration 4 weeks    PT Treatment/Interventions ADLs/Self Care Home Management;Therapeutic activities;Therapeutic exercise;Patient/family education;Manual techniques;Manual lymph drainage;Scar mobilization;Passive range of motion;Moist Heat;Balance training;Functional mobility training;Orthotic Fit/Training;Taping;Compression bandaging;Aquatic Therapy;Cryotherapy;Neuromuscular re-education;Dry needling    PT Next Visit Plan cancer PT : exericises for postural retraining and strengthening manual work for soft tissue mobilizaion and myofascial release to tight tissue on left chest, ...........(ortho) continue with manual therapy to right hip. Progress strengthening of bilateral LE; progress to standing exercises as tolerated. aquatics next visit.           Patient will benefit from skilled therapeutic intervention in order to improve the following deficits and impairments:  Decreased knowledge of precautions,Impaired UE functional use,Decreased range of motion,Postural dysfunction,Decreased scar mobility,Decreased mobility,Pain,Increased fascial restricitons,Increased edema,Abnormal gait,Difficulty walking,Decreased endurance,Decreased activity tolerance,Impaired perceived functional ability,Decreased knowledge of use of DME,Decreased strength  Visit Diagnosis: Chronic left shoulder pain  Disorder of the skin and subcutaneous tissue related to radiation, unspecified  Muscle weakness (generalized)     Problem List Patient Active Problem List   Diagnosis Date Noted  . Breast mass, right 03/27/2020  . Hepatic steatosis 01/22/2020  . Genetic testing 04/02/2018  . Family history of breast cancer   . Malignant  neoplasm of upper-outer quadrant of left breast in female, estrogen receptor positive ([place]) 03/16/2018  . OA (osteoarthritis) of  knee 09/11/2017  . Infrapatellar bursitis of right knee 04/29/2014  . Pneumonia, aspiration ([place]) 12/28/2012  . HYPOTHYROIDISM 11/15/2010  . HYPERLIPIDEMIA 11/15/2010  . DEPRESSION 11/15/2010  . GERD 11/15/2010  . CONSTIPATION 11/15/2010  . GALLSTONES 11/15/2010  . RENAL CYST 11/15/2010  . INSOMNIA UNSPECIFIED 11/15/2010  . COUGH 11/15/2010  . ABDOMINAL PAIN OTHER SPECIFIED SITE 11/15/2010  . TRANSAMINASES, SERUM, ELEVATED 11/15/2010    Carney Living PT DPT  04/27/2021, 10:51 AM  [facility] [address] [place], [place], [address] Phone: [phone]   Fax:  [phone]  Name: [name] MRN: [id number] Date of Birth: 1946/04/16

## 2021-04-28 ENCOUNTER — Encounter (HOSPITAL_BASED_OUTPATIENT_CLINIC_OR_DEPARTMENT_OTHER): Payer: Medicare Other | Admitting: Physical Therapy

## 2021-04-29 ENCOUNTER — Encounter: Payer: Medicare Other | Admitting: Physical Therapy

## 2021-04-29 ENCOUNTER — Ambulatory Visit (HOSPITAL_BASED_OUTPATIENT_CLINIC_OR_DEPARTMENT_OTHER): Payer: Medicare Other | Admitting: Physical Therapy

## 2021-04-29 ENCOUNTER — Other Ambulatory Visit: Payer: Self-pay

## 2021-04-29 DIAGNOSIS — M6281 Muscle weakness (generalized): Secondary | ICD-10-CM | POA: Diagnosis not present

## 2021-04-29 DIAGNOSIS — G8929 Other chronic pain: Secondary | ICD-10-CM

## 2021-04-29 DIAGNOSIS — L599 Disorder of the skin and subcutaneous tissue related to radiation, unspecified: Secondary | ICD-10-CM | POA: Diagnosis not present

## 2021-04-29 DIAGNOSIS — M25512 Pain in left shoulder: Secondary | ICD-10-CM | POA: Diagnosis not present

## 2021-04-30 ENCOUNTER — Encounter (HOSPITAL_BASED_OUTPATIENT_CLINIC_OR_DEPARTMENT_OTHER): Payer: Self-pay | Admitting: Physical Therapy

## 2021-04-30 NOTE — Therapy (Signed)
[facility] [address] [place], [place], [address] Phone: [phone]   Fax:  [phone]  Physical Therapy Treatment  Patient Details  Name: [name] MRN: [id number] Date of Birth: [birth year] Referring Provider (PT): Dr Creola Corn   Encounter Date: 04/29/2021   PT End of Session - 04/30/21 0859    Visit Number 14   5 for ortho 17 requested   Date for PT Re-Evaluation 04/29/21   06/12/2021 for orthot   Authorization Type 5 visits for sciatica Patient re-certed for 12 more    Authorization - Visit Number 5    Authorization - Number of Visits 12    PT Start Time 1230    PT Stop Time 1311    PT Time Calculation (min) 41 min    Activity Tolerance Patient tolerated treatment well    Behavior During Therapy [facility] for tasks assessed/performed           Past Medical History:  Diagnosis Date  . Arthritis    hands and knees  . Complication of anesthesia    aspiration pna; following a colonoscopy, pt requests head of bed elevated if possible.  . Constipation   . Cough   . Depression   . Dysrhythmia    RBBB on 06-06-17 ekg   . Elevated liver function tests   . Esophageal stricture   . Family history of breast cancer   . Gallstones   . Genetic testing 04/02/2018   STAT Breast panel with reflex to Multi-Cancer panel (83 genes) @ [facility] - No pathogenic mutations detected  . GERD (gastroesophageal reflux disease)   . Hiatal hernia   . History of kidney stones   . History of radiation therapy 07/09/2018- 08/03/18   Left Breast/ 40.05 Gy in 15 fractions. Left Breast boost 10 Gy in 5 fractions.   . Hyperlipemia   . Hypothyroidism   . Insomnia   . Malignant neoplasm of upper-outer quadrant of left female breast ([facility])   . Nephrolithiasis   . Pneumonia 2015   aspirated after colonosopy  . Renal cyst     Past Surgical History:  Procedure Laterality Date  . BREAST BIOPSY Left 05/31/2019   mri  . BREAST BIOPSY Left  04/2019   malignant  . BREAST LUMPECTOMY Left 04/17/2018   malignant  . BREAST LUMPECTOMY WITH RADIOACTIVE SEED AND SENTINEL LYMPH NODE BIOPSY Left 04/17/2018   Procedure: LEFT BREAST LUMPECTOMY WITH BRACKETED RADIOACTIVE SEEDS AND SENTINEL LYMPH NODE BIOPSY;  Surgeon: Emelia Loron, MD;  Location: [facility];  Service: General;  Laterality: Left;  . BREAST SURGERY Left    Lumpectomy  . CARDIOVASCULAR STRESS TEST  07/19/2006   EF 70%, NO EVIDENCE OF ISCHEMIA  . CHOLECYSTECTOMY  12/30/10  . COLONOSCOPY    . DILATION AND CURETTAGE OF UTERUS    . HYSTEROSCOPY WITH D & C N/A 06/07/2018   Procedure: DILATATION AND CURETTAGE /HYSTEROSCOPY;  Surgeon: Marcelle Overlie, MD;  Location: [facility];  Service: Gynecology;  Laterality: N/A;  . inguinal herniography    . NASAL SINUS SURGERY    . RADIOACTIVE SEED GUIDED EXCISIONAL BREAST BIOPSY Right 04/17/2018   Procedure: RIGHT BREAST SEED GUIDED EXCISIONAL BIOPSY;  Surgeon: Emelia Loron, MD;  Location: [facility];  Service: General;  Laterality: Right;  . RE-EXCISION OF BREAST LUMPECTOMY Left 05/15/2018   Procedure: RE-EXCISION OF LEFT BREAST LUMPECTOMY, ASPIRATION OF LEFT AXILLARY SEROMA;  Surgeon: Emelia Loron, MD;  Location: [facility];  Service: General;  Laterality: Left;  . T & A    . TOOTH EXTRACTION    . TOTAL KNEE ARTHROPLASTY Left 09/11/2017   Procedure: LEFT TOTAL KNEE ARTHROPLASTY;  Surgeon: Ollen Gross, MD;  Location: [facility];  Service: Orthopedics;  Laterality: Left;  with block  . US ECHOCARDIOGRAPHY  07/17/2006   EF 55-60%    There were no vitals filed for this visit.   Subjective Assessment - 04/30/21 0854    Subjective Patient continues to have pain in both area. She did like the pool exercises. We will continue to progress as tolerated.    Pertinent History Patient was diagnosed on 03/08/18 with left grade 1-2 invasive ductal carcinoma breast cancer.It measured 1.5 cm and is located  in the upper outer quadrant. It is ER/PR positive and HER2 negative with a Ki67 of < 1%.  She had a left total knee replacement in 10/18 with Dr. Lequita Halt and continues to have pain in her left lateral knee. Her orthopedist reported it is likely due to her iliotibial band. Patient reports she underwent a left lumpectomy and SNLB (0/4 nodes positive) removed on 04/17/18.She completed radiation, but no chemo    Limitations Standing;Walking    How long can you stand comfortably? limited time before she has pain    How long can you walk comfortably? depends on the day    Diagnostic tests MRI of lumbar spine: L4 disc buldge with possible L5 nerve root impingement    Patient Stated Goals to get rid of the left armpit and arm pain    Currently in Pain? Yes    Pain Score 6     Pain Location Back    Pain Orientation Right    Pain Descriptors / Indicators Aching    Pain Type Chronic pain    Pain Onset More than a month ago    Pain Frequency Intermittent    Aggravating Factors  standing and walking    Effect of Pain on Daily Activities difficulty perfroming ADL's    Multiple Pain Sites No              [facility] PT Assessment - 04/30/21 0001      Assessment   Medical Diagnosis Right Low Back Pain/ Left Knee Pain    Referring Provider (PT) Dr Creola Corn      Balance Screen   Has the patient fallen in the past 6 months No    Has the patient had a decrease in activity level because of a fear of falling?  No    Is the patient reluctant to leave their home because of a fear of falling?  No      AROM   Overall AROM Comments continued pain with end range hip flexion      Strength   Right Hip Flexion 4/5    Right Hip ABduction 4/5    Right Hip ADduction 4+/5    Left Hip Flexion 4/5    Left Knee Flexion 4/5    Left Knee Extension 4/5                Pt seen for aquatic therapy today.  Treatment took place in water 3.25-4 ft in depth at the [facility] pool. Temp of water was 91.  Pt  entered/exited the pool via stairs (step through pattern) independently with bilat rail.    Warm up: heel/toe walking x4 laps across pool chest deep side stepping x4 laps from shallow to deep;  Exercises; Slow march x20; hip 3 way x20; squats x20; hip extension x20; hip abduction x20; Sit to stand x20;  board trunk flexion x10 lateral board rotation x10 each way seated   With noodle neck noddle and foot float; push pull x20; side to side peratabbations from the feet x20; press and push x20  Steps step up x20 each leg; lateral step up x20 each leg;   Noodle push and pull x20 with core breathing; noodle press x20 with core breathing   Pt requires buoyancy for support and to offload joints with strengthening exercises. Viscosity of the water is needed for resistance of strengthening; water current perturbations provides challenge to standing balance unsupported, requiring increased core activation.                   PT Education - 04/30/21 0856    Education Details reviewed tehcnique with step ups    Person(s) Educated Patient    Methods Explanation;Tactile cues;Demonstration;Verbal cues    Comprehension Verbalized understanding;Verbal cues required;Returned demonstration;Tactile cues required            PT Short Term Goals - 04/02/21 1240      PT SHORT TERM GOAL #1   Baseline 6/10, 04/02/2021 5/10    Status Achieved      PT SHORT TERM GOAL #2   Title Pt will report she is doing a beginning home exericse program for postural and strength exercises    Status Achieved             PT Long Term Goals - 04/02/21 1230      PT LONG TERM GOAL #1   Title Pt will have painfree left shoulder abduction of 145 degrees so that she can reach into a kitchen cabinet at home    Baseline 134 degrees with 6/10 pain on 02/17/2021    Time 4    Period Weeks    Status On-going      PT LONG TERM GOAL #2   Title Pt will be independent in progressive resistive home exercise program  for strength and fitness    Baseline pt is limited in progress of exericse program by pain    Time 4    Period Weeks    Status On-going      PT LONG TERM GOAL #3   Title Pt will report that her overall pain and discomfort is decreased by 50%    Time 4    Period Weeks    Status On-going                 Plan - 04/30/21 0902    Clinical Impression Statement Patient was able to perfrom steps in the pool. She can not perform steps on land without significant pain. She also perfromed floating exercises for decmopression and core control. She reported no pain in the sciatic area with these exercises. Her knee continues to hurt but it dosen't change. Overall she is making good progress. Her pain in her knee is the same but her amount of activity she is perfroming has improved significantly. She is walking daily and perfroming exercises on a regular basis. Her strength is making progress depsite pain. She would benefit from contrinued skilled therapy 2W6 to progress towards improved functional mobility.    Personal Factors and Comorbidities Comorbidity 3+    Comorbidities Previous Lt TKR with persistent pain, Previous Lt breast lumpectomy and radiation, right sciatic pain    Examination-Activity Limitations Carry;Lift    Stability/Clinical Decision  Making Evolving/Moderate complexity    Clinical Decision Making Moderate    Rehab Potential Good    Clinical Impairments Affecting Rehab Potential pain in left leg, pain in right leg, decreased tolerance of gait due to pain,    PT Frequency 1x / week    PT Duration 4 weeks    PT Treatment/Interventions ADLs/Self Care Home Management;Therapeutic activities;Therapeutic exercise;Patient/family education;Manual techniques;Manual lymph drainage;Scar mobilization;Passive range of motion;Moist Heat;Balance training;Functional mobility training;Orthotic Fit/Training;Taping;Compression bandaging;Aquatic Therapy;Cryotherapy;Neuromuscular re-education;Dry  needling    PT Next Visit Plan cancer PT : exericises for postural retraining and strengthening manual work for soft tissue mobilizaion and myofascial release to tight tissue on left chest, ...........(ortho) continue with manual therapy to right hip. Progress strengthening of bilateral LE; progress to standing exercises as tolerated. aquatics next visit.    PT Home Exercise Plan quad set, SLR,  prifriformis stretch; hamstring stretch;    Consulted and Agree with Plan of Care Patient           Patient will benefit from skilled therapeutic intervention in order to improve the following deficits and impairments:  Decreased knowledge of precautions,Impaired UE functional use,Decreased range of motion,Postural dysfunction,Decreased scar mobility,Decreased mobility,Pain,Increased fascial restricitons,Increased edema,Abnormal gait,Difficulty walking,Decreased endurance,Decreased activity tolerance,Impaired perceived functional ability,Decreased knowledge of use of DME,Decreased strength  Visit Diagnosis: Chronic left shoulder pain  Disorder of the skin and subcutaneous tissue related to radiation, unspecified  Muscle weakness (generalized)     Problem List Patient Active Problem List   Diagnosis Date Noted  . Breast mass, right 03/27/2020  . Hepatic steatosis 01/22/2020  . Genetic testing 04/02/2018  . Family history of breast cancer   . Malignant neoplasm of upper-outer quadrant of left breast in female, estrogen receptor positive ([place]) 03/16/2018  . OA (osteoarthritis) of knee 09/11/2017  . Infrapatellar bursitis of right knee 04/29/2014  . Pneumonia, aspiration ([place]) 12/28/2012  . HYPOTHYROIDISM 11/15/2010  . HYPERLIPIDEMIA 11/15/2010  . DEPRESSION 11/15/2010  . GERD 11/15/2010  . CONSTIPATION 11/15/2010  . GALLSTONES 11/15/2010  . RENAL CYST 11/15/2010  . INSOMNIA UNSPECIFIED 11/15/2010  . COUGH 11/15/2010  . ABDOMINAL PAIN OTHER SPECIFIED SITE 11/15/2010  . TRANSAMINASES,  SERUM, ELEVATED 11/15/2010    Carney Living 04/30/2021, 9:08 AM  [facility] [address] [place], [place], [address] Phone: [phone]   Fax:  [phone]  Name: [name] MRN: [id number] Date of Birth: Apr 25, 1946

## 2021-05-05 ENCOUNTER — Telehealth: Payer: Medicare Other | Admitting: Oncology

## 2021-05-06 ENCOUNTER — Ambulatory Visit (HOSPITAL_BASED_OUTPATIENT_CLINIC_OR_DEPARTMENT_OTHER): Payer: Medicare Other | Admitting: Physical Therapy

## 2021-05-06 ENCOUNTER — Other Ambulatory Visit: Payer: Self-pay

## 2021-05-06 ENCOUNTER — Encounter (HOSPITAL_BASED_OUTPATIENT_CLINIC_OR_DEPARTMENT_OTHER): Payer: Self-pay | Admitting: Physical Therapy

## 2021-05-06 DIAGNOSIS — G8929 Other chronic pain: Secondary | ICD-10-CM | POA: Diagnosis not present

## 2021-05-06 DIAGNOSIS — L821 Other seborrheic keratosis: Secondary | ICD-10-CM | POA: Diagnosis not present

## 2021-05-06 DIAGNOSIS — L814 Other melanin hyperpigmentation: Secondary | ICD-10-CM | POA: Diagnosis not present

## 2021-05-06 DIAGNOSIS — M6281 Muscle weakness (generalized): Secondary | ICD-10-CM

## 2021-05-06 DIAGNOSIS — D225 Melanocytic nevi of trunk: Secondary | ICD-10-CM | POA: Diagnosis not present

## 2021-05-06 DIAGNOSIS — M25512 Pain in left shoulder: Secondary | ICD-10-CM | POA: Diagnosis not present

## 2021-05-06 DIAGNOSIS — C44622 Squamous cell carcinoma of skin of right upper limb, including shoulder: Secondary | ICD-10-CM | POA: Diagnosis not present

## 2021-05-06 DIAGNOSIS — L570 Actinic keratosis: Secondary | ICD-10-CM | POA: Diagnosis not present

## 2021-05-06 DIAGNOSIS — L599 Disorder of the skin and subcutaneous tissue related to radiation, unspecified: Secondary | ICD-10-CM | POA: Diagnosis not present

## 2021-05-06 DIAGNOSIS — D485 Neoplasm of uncertain behavior of skin: Secondary | ICD-10-CM | POA: Diagnosis not present

## 2021-05-06 DIAGNOSIS — L820 Inflamed seborrheic keratosis: Secondary | ICD-10-CM | POA: Diagnosis not present

## 2021-05-06 NOTE — Therapy (Signed)
[facility] [address] [place], [place], [address] Phone: [phone]   Fax:  [phone]  Physical Therapy Treatment  Patient Details  Name: [name] MRN: [id number] Date of Birth: 1945-12-26 Referring Provider (PT): Dr Shon Baton   Encounter Date: 05/06/2021   PT End of Session - 05/06/21 1419    Visit Number 15   6th for ortho   Number of Visits 17    Date for PT Re-Evaluation --   7/2 for ortho   Authorization Type begin KX 2nd to 15 visit limit reached    Authorization - Visit Number 6    Authorization - Number of Visits 12    PT Start Time 0930    PT Stop Time 1012    PT Time Calculation (min) 42 min    Activity Tolerance Patient tolerated treatment well    Behavior During Therapy [facility] for tasks assessed/performed           Past Medical History:  Diagnosis Date  . Arthritis    hands and knees  . Complication of anesthesia    aspiration pna; following a colonoscopy, pt requests head of bed elevated if possible.  . Constipation   . Cough   . Depression   . Dysrhythmia    RBBB on 06-06-17 ekg   . Elevated liver function tests   . Esophageal stricture   . Family history of breast cancer   . Gallstones   . Genetic testing 04/02/2018   STAT Breast panel with reflex to Multi-Cancer panel (83 genes) @ [facility] - No pathogenic mutations detected  . GERD (gastroesophageal reflux disease)   . Hiatal hernia   . History of kidney stones   . History of radiation therapy 07/09/2018- 08/03/18   Left Breast/ 40.05 Gy in 15 fractions. Left Breast boost 10 Gy in 5 fractions.   . Hyperlipemia   . Hypothyroidism   . Insomnia   . Malignant neoplasm of upper-outer quadrant of left female breast ([place])   . Nephrolithiasis   . Pneumonia 2015   aspirated after colonosopy  . Renal cyst     Past Surgical History:  Procedure Laterality Date  . BREAST BIOPSY Left 05/31/2019   mri  . BREAST BIOPSY Left 04/2019    malignant  . BREAST LUMPECTOMY Left 04/17/2018   malignant  . BREAST LUMPECTOMY WITH RADIOACTIVE SEED AND SENTINEL LYMPH NODE BIOPSY Left 04/17/2018   Procedure: LEFT BREAST LUMPECTOMY WITH BRACKETED RADIOACTIVE SEEDS AND SENTINEL LYMPH NODE BIOPSY;  Surgeon: Rolm Bookbinder, MD;  Location: [place];  Service: General;  Laterality: Left;  . BREAST SURGERY Left    Lumpectomy  . CARDIOVASCULAR STRESS TEST  07/19/2006   EF 70%, NO EVIDENCE OF ISCHEMIA  . CHOLECYSTECTOMY  12/30/10  . COLONOSCOPY    . DILATION AND CURETTAGE OF UTERUS    . HYSTEROSCOPY WITH D & C N/A 06/07/2018   Procedure: DILATATION AND CURETTAGE /HYSTEROSCOPY;  Surgeon: [name], MD;  Location: [facility];  Service: Gynecology;  Laterality: N/A;  . inguinal herniography    . NASAL SINUS SURGERY    . RADIOACTIVE SEED GUIDED EXCISIONAL BREAST BIOPSY Right 04/17/2018   Procedure: RIGHT BREAST SEED GUIDED EXCISIONAL BIOPSY;  Surgeon: Rolm Bookbinder, MD;  Location: [place];  Service: General;  Laterality: Right;  . RE-EXCISION OF BREAST LUMPECTOMY Left 05/15/2018   Procedure: RE-EXCISION OF LEFT BREAST LUMPECTOMY, ASPIRATION OF LEFT AXILLARY SEROMA;  Surgeon: Rolm Bookbinder, MD;  Location: [facility];  Service: General;  Laterality: Left;  . T & A    . TOOTH EXTRACTION    . TOTAL KNEE ARTHROPLASTY Left 09/11/2017   Procedure: LEFT TOTAL KNEE ARTHROPLASTY;  Surgeon: Gaynelle Arabian, MD;  Location: [facility];  Service: Orthopedics;  Laterality: Left;  with block  . US ECHOCARDIOGRAPHY  07/17/2006   EF 55-60%    There were no vitals filed for this visit.   Subjective Assessment - 05/06/21 1413    Subjective Patient reports no change in her pain. She continues to walk. Her husband has noticed that she is standing and walking a bit better. She continues to have the sciatic pain but it is not as frequent. It can still be severe.    Pertinent History Patient was diagnosed on 03/08/18  with left grade 1-2 invasive ductal carcinoma breast cancer.It measured 1.5 cm and is located in the upper outer quadrant. It is ER/PR positive and HER2 negative with a Ki67 of < 1%.  She had a left total knee replacement in 10/18 with Dr. Wynelle Link and continues to have pain in her left lateral knee. Her orthopedist reported it is likely due to her iliotibial band. Patient reports she underwent a left lumpectomy and SNLB (0/4 nodes positive) removed on 04/17/18.She completed radiation, but no chemo    Limitations Standing;Walking    How long can you stand comfortably? limited time before she has pain    How long can you walk comfortably? depends on the day    Diagnostic tests MRI of lumbar spine: L4 disc buldge with possible L5 nerve root impingement    Patient Stated Goals to get rid of the left armpit and arm pain    Currently in Pain? Yes    Pain Score 8     Pain Location Knee    Pain Orientation Right    Pain Descriptors / Indicators Aching    Pain Type Chronic pain    Pain Onset More than a month ago    Pain Frequency Constant    Aggravating Factors  standing and walkin    Pain Relieving Factors nothing    Effect of Pain on Daily Activities difficulty perfroming ADL's                    Pt seen for aquatic therapy today.  Treatment took place in water 3.25-4 ft in depth at the [employer] [facility] pool. Temp of water was 91.  Pt entered/exited the pool via stairs (step through pattern) independently with bilat rail.      Pt requires buoyancy for support and to offload joints with strengthening exercises. Viscosity of the water is needed for resistance of strengthening; water current perturbations provides challenge to standing balance unsupported, requiring increased core activation.      Warm up: heel/toe walking x4 laps across pool chest deep  side stepping x4 laps from shallow to deep;  Long stride 4 laps   Exercises; Slow march x20;  Kick back x20; Kick to the  side;  squats x20; hip extension x20; hip abduction x20; Sit to stand x20;  Sitting kick out x20 each leg   Press noodle down in the water with good posture x20; push noodle back and forth x20   Floating with feet supported: lateral perturbations and forward and back x20 each direction with random movements  Step up x20 bilateral; lateral step up x20 bilateral      Pt requires buoyancy for support and to offload  joints with strengthening exercises. Viscosity of the water is needed for resistance of strengthening; water current perturbations provides challenge to standing balance unsupported, requiring increased core activation.            PT Education - 05/06/21 1418    Education Details HEP and symptom mangement    Person(s) Educated Patient    Methods Explanation;Demonstration;Verbal cues;Tactile cues    Comprehension Verbalized understanding;Returned demonstration;Verbal cues required            PT Short Term Goals - 04/02/21 1240      PT SHORT TERM GOAL #1   Baseline 6/10, 04/02/2021 5/10    Status Achieved      PT SHORT TERM GOAL #2   Title Pt will report she is doing a beginning home exericse program for postural and strength exercises    Status Achieved             PT Long Term Goals - 04/02/21 1230      PT LONG TERM GOAL #1   Title Pt will have painfree left shoulder abduction of 145 degrees so that she can reach into a kitchen cabinet at home    Baseline 134 degrees with 6/10 pain on 02/17/2021    Time 4    Period Weeks    Status On-going      PT LONG TERM GOAL #2   Title Pt will be independent in progressive resistive home exercise program for strength and fitness    Baseline pt is limited in progress of exericse program by pain    Time 4    Period Weeks    Status On-going      PT LONG TERM GOAL #3   Title Pt will report that her overall pain and discomfort is decreased by 50%    Time 4    Period Weeks    Status On-going                  Plan - 05/06/21 1422    Clinical Impression Statement Patient continues to tolerate pool exercises well. She is able to do functional activity such as squats, steps, and sit to stand transfers without pain. On land these activities cause her knee to hurt. Patient perfromed core exercises as welll as hip and knee exercises. We will continue to progress as tolerated.    Personal Factors and Comorbidities Comorbidity 3+    Comorbidities Previous Lt TKR with persistent pain, Previous Lt breast lumpectomy and radiation, right sciatic pain    Examination-Activity Limitations Carry;Lift    Stability/Clinical Decision Making Evolving/Moderate complexity    Clinical Decision Making Moderate    Rehab Potential Good    Clinical Impairments Affecting Rehab Potential pain in left leg, pain in right leg, decreased tolerance of gait due to pain,    PT Frequency 1x / week    PT Duration 4 weeks    PT Treatment/Interventions ADLs/Self Care Home Management;Therapeutic activities;Therapeutic exercise;Patient/family education;Manual techniques;Manual lymph drainage;Scar mobilization;Passive range of motion;Moist Heat;Balance training;Functional mobility training;Orthotic Fit/Training;Taping;Compression bandaging;Aquatic Therapy;Cryotherapy;Neuromuscular re-education;Dry needling    PT Next Visit Plan cancer PT : exericises for postural retraining and strengthening manual work for soft tissue mobilizaion and myofascial release to tight tissue on left chest, ...........(ortho) continue with manual therapy to right hip. Progress strengthening of bilateral LE; progress to standing exercises as tolerated. aquatics next visit.    PT Home Exercise Plan quad set, SLR,  prifriformis stretch; hamstring stretch;    Consulted and Agree with Plan of  Care Patient           Patient will benefit from skilled therapeutic intervention in order to improve the following deficits and impairments:  Decreased knowledge of  precautions,Impaired UE functional use,Decreased range of motion,Postural dysfunction,Decreased scar mobility,Decreased mobility,Pain,Increased fascial restricitons,Increased edema,Abnormal gait,Difficulty walking,Decreased endurance,Decreased activity tolerance,Impaired perceived functional ability,Decreased knowledge of use of DME,Decreased strength  Visit Diagnosis: Chronic left shoulder pain  Disorder of the skin and subcutaneous tissue related to radiation, unspecified  Muscle weakness (generalized)     Problem List Patient Active Problem List   Diagnosis Date Noted  . Breast mass, right 03/27/2020  . Hepatic steatosis 01/22/2020  . Genetic testing 04/02/2018  . Family history of breast cancer   . Malignant neoplasm of upper-outer quadrant of left breast in female, estrogen receptor positive ([place]) 03/16/2018  . OA (osteoarthritis) of knee 09/11/2017  . Infrapatellar bursitis of right knee 04/29/2014  . Pneumonia, aspiration ([place]) 12/28/2012  . HYPOTHYROIDISM 11/15/2010  . HYPERLIPIDEMIA 11/15/2010  . DEPRESSION 11/15/2010  . GERD 11/15/2010  . CONSTIPATION 11/15/2010  . GALLSTONES 11/15/2010  . RENAL CYST 11/15/2010  . INSOMNIA UNSPECIFIED 11/15/2010  . COUGH 11/15/2010  . ABDOMINAL PAIN OTHER SPECIFIED SITE 11/15/2010  . TRANSAMINASES, SERUM, ELEVATED 11/15/2010    Carney Living PT DPT  05/06/2021, 7:29 PM  [facility] [address] [place], [place], [address] Phone: [phone]   Fax:  [phone]  Name: [name] MRN: [id number] Date of Birth: 1946-04-06

## 2021-05-07 ENCOUNTER — Ambulatory Visit: Payer: Medicare Other | Admitting: Physical Therapy

## 2021-05-07 DIAGNOSIS — L599 Disorder of the skin and subcutaneous tissue related to radiation, unspecified: Secondary | ICD-10-CM | POA: Diagnosis not present

## 2021-05-07 DIAGNOSIS — M6281 Muscle weakness (generalized): Secondary | ICD-10-CM | POA: Diagnosis not present

## 2021-05-07 DIAGNOSIS — M25512 Pain in left shoulder: Secondary | ICD-10-CM | POA: Diagnosis not present

## 2021-05-07 DIAGNOSIS — G8929 Other chronic pain: Secondary | ICD-10-CM

## 2021-05-07 NOTE — Therapy (Signed)
[facility] [address] [place], [place], [address] Phone: [phone]   Fax:  [phone]  Physical Therapy Treatment  Patient Details  Name: [name] MRN: [id number] Date of Birth: December 23, 1945 Referring Provider (PT): Dr Creola Corn   Encounter Date: 05/07/2021   PT End of Session - 05/07/21 1205    Visit Number 13   for cancer rehab   Number of Visits 17    Activity Tolerance Patient tolerated treatment well    Behavior During Therapy [facility] for tasks assessed/performed           Past Medical History:  Diagnosis Date  . Arthritis    hands and knees  . Complication of anesthesia    aspiration pna; following a colonoscopy, pt requests head of bed elevated if possible.  . Constipation   . Cough   . Depression   . Dysrhythmia    RBBB on 06-06-17 ekg   . Elevated liver function tests   . Esophageal stricture   . Family history of breast cancer   . Gallstones   . Genetic testing 04/02/2018   STAT Breast panel with reflex to Multi-Cancer panel (83 genes) @ [facility] - No pathogenic mutations detected  . GERD (gastroesophageal reflux disease)   . Hiatal hernia   . History of kidney stones   . History of radiation therapy 07/09/2018- 08/03/18   Left Breast/ 40.05 Gy in 15 fractions. Left Breast boost 10 Gy in 5 fractions.   . Hyperlipemia   . Hypothyroidism   . Insomnia   . Malignant neoplasm of upper-outer quadrant of left female breast ([facility])   . Nephrolithiasis   . Pneumonia 2015   aspirated after colonosopy  . Renal cyst     Past Surgical History:  Procedure Laterality Date  . BREAST BIOPSY Left 05/31/2019   mri  . BREAST BIOPSY Left 04/2019   malignant  . BREAST LUMPECTOMY Left 04/17/2018   malignant  . BREAST LUMPECTOMY WITH RADIOACTIVE SEED AND SENTINEL LYMPH NODE BIOPSY Left 04/17/2018   Procedure: LEFT BREAST LUMPECTOMY WITH BRACKETED RADIOACTIVE SEEDS AND SENTINEL LYMPH NODE BIOPSY;  Surgeon:  Emelia Loron, MD;  Location: [facility];  Service: General;  Laterality: Left;  . BREAST SURGERY Left    Lumpectomy  . CARDIOVASCULAR STRESS TEST  07/19/2006   EF 70%, NO EVIDENCE OF ISCHEMIA  . CHOLECYSTECTOMY  12/30/10  . COLONOSCOPY    . DILATION AND CURETTAGE OF UTERUS    . HYSTEROSCOPY WITH D & C N/A 06/07/2018   Procedure: DILATATION AND CURETTAGE /HYSTEROSCOPY;  Surgeon: Marcelle Overlie, MD;  Location: [facility];  Service: Gynecology;  Laterality: N/A;  . inguinal herniography    . NASAL SINUS SURGERY    . RADIOACTIVE SEED GUIDED EXCISIONAL BREAST BIOPSY Right 04/17/2018   Procedure: RIGHT BREAST SEED GUIDED EXCISIONAL BIOPSY;  Surgeon: Emelia Loron, MD;  Location: [facility];  Service: General;  Laterality: Right;  . RE-EXCISION OF BREAST LUMPECTOMY Left 05/15/2018   Procedure: RE-EXCISION OF LEFT BREAST LUMPECTOMY, ASPIRATION OF LEFT AXILLARY SEROMA;  Surgeon: Emelia Loron, MD;  [facility];  Service: General;  Laterality: Left;  . T & A    . TOOTH EXTRACTION    . TOTAL KNEE ARTHROPLASTY Left 09/11/2017   Procedure: LEFT TOTAL KNEE ARTHROPLASTY;  Surgeon: Ollen Gross, MD;  Location: [facility];  Service: Orthopedics;  Laterality: Left;  with block  . US ECHOCARDIOGRAPHY  07/17/2006   EF 55-60%  There were no vitals filed for this visit.   Subjective Assessment - 05/07/21 1213    Subjective Pt says she is the aquatic therapy seems to be working for her as she feels she can really exericse the way she wants to when she is in the water. She is still having some soreness in her left chest anc axilla    Pertinent History Patient was diagnosed on 03/08/18 with left grade 1-2 invasive ductal carcinoma breast cancer.It measured 1.5 cm and is located in the upper outer quadrant. It is ER/PR positive and HER2 negative with a Ki67 of < 1%.  She had a left total knee replacement in 10/18 with Dr. Wynelle Link and continues to have pain in  her left lateral knee. Her orthopedist reported it is likely due to her iliotibial band. Patient reports she underwent a left lumpectomy and SNLB (0/4 nodes positive) removed on 04/17/18.She completed radiation, but no chemo    Currently in Pain? Yes    Pain Score 2     Pain Location Axilla              [facility] PT Assessment - 05/07/21 0001      AROM   Left Shoulder Flexion 148 Degrees    Left Shoulder ABduction 160 Degrees                         [facility] Adult PT Treatment/Exercise - 05/07/21 0001      Manual Therapy   Manual Therapy Soft tissue mobilization;Edema management    Soft tissue mobilization with coconut butter in supine to Left pectoralis, border of axilla, left trunk area, left incisions and lats, then to sidelying for work on external rotators and posterior shoulder.                    PT Short Term Goals - 05/07/21 1211      PT SHORT TERM GOAL #1   Title Pt will report that she has had a reduction of discomfort in her left axilla and lateral chest to 5/10    Baseline 6/10, 04/02/2021 5/10, 05/07/2021    Time 2    Period Weeks    Status Achieved      PT SHORT TERM GOAL #2   Title Pt will report she is doing a beginning home exericse program for postural and strength exercises    Status Achieved             PT Long Term Goals - 05/07/21 1111      PT LONG TERM GOAL #1   Title Pt will have painfree left shoulder abduction of 145 degrees so that she can reach into a kitchen cabinet at home    Baseline 134 degrees with 6/10 pain on 02/17/2021, 160 degrees with 2/10 pain    Status Partially Met   much improved     PT LONG TERM GOAL #2   Title Pt will be independent in progressive resistive home exercise program for strength and fitness    Baseline ongoing as pt is participating  in aquatic therapy    Status On-going      PT LONG TERM GOAL #3   Title Pt will report that her overall pain and discomfort is decreased by 50%    Status  Achieved                 Plan - 05/07/21 1206    Clinical Impression Statement Pt has  been doing well with aquatic therapy.  She still has pain in her left upper quadrant especailly at the area between her incisions, but it if feels that it is softer than it was when she started. She feels that she is ready to discontinue this PT and will continue with aquatic therapy and general conditioning that will include upper extremity strength. Her cancer rehab goals have been paritially met as she has improved her ROM, but still has some pain with overhead reaching.    Personal Factors and Comorbidities Comorbidity 3+    Comorbidities Previous Lt TKR with persistent pain, Previous Lt breast lumpectomy and radiation, right sciatic pain    Examination-Activity Limitations Carry;Lift    Rehab Potential Good    Clinical Impairments Affecting Rehab Potential pain in left leg, pain in right leg, decreased tolerance of gait due to pain,    PT Frequency 1x / week    PT Duration 4 weeks    PT Treatment/Interventions ADLs/Self Care Home Management;Therapeutic activities;Therapeutic exercise;Patient/family education;Manual techniques;Manual lymph drainage;Scar mobilization;Passive range of motion;Moist Heat;Balance training;Functional mobility training;Orthotic Fit/Training;Taping;Compression bandaging;Aquatic Therapy;Cryotherapy;Neuromuscular re-education;Dry needling    PT Next Visit Plan cancer PT : discharged .(ortho) continue with manual therapy to right hip. Progress strengthening of bilateral LE; progress to standing exercises as tolerated. aquatics next visit.    PT Home Exercise Plan quad set, SLR,  prifriformis stretch; hamstring stretch;    Consulted and Agree with Plan of Care Patient           Patient will benefit from skilled therapeutic intervention in order to improve the following deficits and impairments:  Decreased knowledge of precautions,Impaired UE functional use,Decreased range of  motion,Postural dysfunction,Decreased scar mobility,Decreased mobility,Pain,Increased fascial restricitons,Increased edema,Abnormal gait,Difficulty walking,Decreased endurance,Decreased activity tolerance,Impaired perceived functional ability,Decreased knowledge of use of DME,Decreased strength  Visit Diagnosis: Chronic left shoulder pain  Disorder of the skin and subcutaneous tissue related to radiation, unspecified  Muscle weakness (generalized)     Problem List Patient Active Problem List   Diagnosis Date Noted  . Breast mass, right 03/27/2020  . Hepatic steatosis 01/22/2020  . Genetic testing 04/02/2018  . Family history of breast cancer   . Malignant neoplasm of upper-outer quadrant of left breast in female, estrogen receptor positive ([place]) 03/16/2018  . OA (osteoarthritis) of knee 09/11/2017  . Infrapatellar bursitis of right knee 04/29/2014  . Pneumonia, aspiration ([place]) 12/28/2012  . HYPOTHYROIDISM 11/15/2010  . HYPERLIPIDEMIA 11/15/2010  . DEPRESSION 11/15/2010  . GERD 11/15/2010  . CONSTIPATION 11/15/2010  . GALLSTONES 11/15/2010  . RENAL CYST 11/15/2010  . INSOMNIA UNSPECIFIED 11/15/2010  . COUGH 11/15/2010  . ABDOMINAL PAIN OTHER SPECIFIED SITE 11/15/2010  . TRANSAMINASES, SERUM, ELEVATED 11/15/2010   PHYSICAL THERAPY DISCHARGE SUMMARY  Visits from Start of Care: 13  Current functional level related to goals / functional outcomes: Independent    Remaining deficits: Still with pain and tightness in left chest area between incisions    Education / Equipment: Home exercise  Plan: Patient agrees to discharge.  Patient goals were partially met. Patient is being discharged due to being pleased with the current functional level.  ?????    Donato Heinz. Owens Shark PT  [place] Levo 05/07/2021, 12:15 PM  [facility] [place], [place], [address] Phone: [phone]   Fax:  [phone]  Name:  [name] MRN: [id number] Date of Birth: 03-23-1946

## 2021-05-20 ENCOUNTER — Ambulatory Visit (HOSPITAL_BASED_OUTPATIENT_CLINIC_OR_DEPARTMENT_OTHER): Payer: Medicare Other | Attending: Internal Medicine | Admitting: Physical Therapy

## 2021-05-20 ENCOUNTER — Other Ambulatory Visit: Payer: Self-pay

## 2021-05-20 ENCOUNTER — Encounter (HOSPITAL_BASED_OUTPATIENT_CLINIC_OR_DEPARTMENT_OTHER): Payer: Self-pay | Admitting: Physical Therapy

## 2021-05-20 DIAGNOSIS — R2689 Other abnormalities of gait and mobility: Secondary | ICD-10-CM | POA: Insufficient documentation

## 2021-05-20 DIAGNOSIS — M25512 Pain in left shoulder: Secondary | ICD-10-CM | POA: Insufficient documentation

## 2021-05-20 DIAGNOSIS — M25551 Pain in right hip: Secondary | ICD-10-CM | POA: Insufficient documentation

## 2021-05-20 DIAGNOSIS — G8929 Other chronic pain: Secondary | ICD-10-CM | POA: Insufficient documentation

## 2021-05-20 DIAGNOSIS — M25562 Pain in left knee: Secondary | ICD-10-CM | POA: Diagnosis not present

## 2021-05-21 NOTE — Therapy (Signed)
[place], [place], [address] Phone: [phone]   Fax:  [phone]  Physical Therapy Treatment  Patient Details  Name: [name] MRN: [id number] Date of Birth: Apr 17, 1946 Referring Provider (PT): Dr Shon Baton   Encounter Date: 05/20/2021   PT End of Session - 05/20/21 2234     Visit Number 7    Number of Visits 12    Date for PT Re-Evaluation 06/12/21    Authorization Type begin KX 2nd to 15 visit limit reached    Authorization - Visit Number 7    Authorization - Number of Visits 12    PT Start Time 1101    PT Stop Time 1141    PT Time Calculation (min) 40 min    Activity Tolerance Patient tolerated treatment well    Behavior During Therapy [facility] for tasks assessed/performed             Past Medical History:  Diagnosis Date   Arthritis    hands and knees   Complication of anesthesia    aspiration pna; following a colonoscopy, pt requests head of bed elevated if possible.   Constipation    Cough    Depression    Dysrhythmia    RBBB on 06-06-17 ekg    Elevated liver function tests    Esophageal stricture    Family history of breast cancer    Gallstones    Genetic testing 04/02/2018   STAT Breast panel with reflex to Multi-Cancer panel (83 genes) @ [facility] - No pathogenic mutations detected   GERD (gastroesophageal reflux disease)    Hiatal hernia    History of kidney stones    History of radiation therapy 07/09/2018- 08/03/18   Left Breast/ 40.05 Gy in 15 fractions. Left Breast boost 10 Gy in 5 fractions.    Hyperlipemia    Hypothyroidism    Insomnia    Malignant neoplasm of upper-outer quadrant of left female breast ([place])    Nephrolithiasis    Pneumonia 2015   aspirated after colonosopy   Renal cyst     Past Surgical History:  Procedure Laterality Date   BREAST BIOPSY Left 05/31/2019   mri   BREAST BIOPSY Left 04/2019   malignant   BREAST LUMPECTOMY Left 04/17/2018    malignant   BREAST LUMPECTOMY WITH RADIOACTIVE SEED AND SENTINEL LYMPH NODE BIOPSY Left 04/17/2018   Procedure: LEFT BREAST LUMPECTOMY WITH BRACKETED RADIOACTIVE SEEDS AND SENTINEL LYMPH NODE BIOPSY;  Surgeon: Rolm Bookbinder, MD;  Location: [facility];  Service: General;  Laterality: Left;   BREAST SURGERY Left    Lumpectomy   CARDIOVASCULAR STRESS TEST  07/19/2006   EF 70%, NO EVIDENCE OF ISCHEMIA   CHOLECYSTECTOMY  12/30/10   COLONOSCOPY     DILATION AND CURETTAGE OF UTERUS     HYSTEROSCOPY WITH D & C N/A 06/07/2018   Procedure: DILATATION AND CURETTAGE /HYSTEROSCOPY;  Surgeon: [name], MD;  Location: [facility];  Service: Gynecology;  Laterality: N/A;   inguinal herniography     NASAL SINUS SURGERY     RADIOACTIVE SEED GUIDED EXCISIONAL BREAST BIOPSY Right 04/17/2018   Procedure: RIGHT BREAST SEED GUIDED EXCISIONAL BIOPSY;  Surgeon: Rolm Bookbinder, MD;  Location: [facility];  Service: General;  Laterality: Right;   RE-EXCISION OF BREAST LUMPECTOMY Left 05/15/2018   Procedure: RE-EXCISION OF LEFT BREAST LUMPECTOMY, ASPIRATION OF LEFT AXILLARY SEROMA;  Surgeon: Rolm Bookbinder, MD;  Location: [facility];  Service:  General;  Laterality: Left;   T & A     TOOTH EXTRACTION     TOTAL KNEE ARTHROPLASTY Left 09/11/2017   Procedure: LEFT TOTAL KNEE ARTHROPLASTY;  Surgeon: Gaynelle Arabian, MD;  Location: [facility];  Service: Orthopedics;  Laterality: Left;  with block   US ECHOCARDIOGRAPHY  07/17/2006   EF 55-60%    There were no vitals filed for this visit.   Subjective Assessment - 05/20/21 2221     Subjective Patient went away for a few days and did some walking. She is now having significant pain into her right hip and left knee. The walk was 2 days ago.    Pertinent History Patient was diagnosed on 03/08/18 with left grade 1-2 invasive ductal carcinoma breast cancer.It measured 1.5 cm and is located in the upper outer quadrant. It is ER/PR  positive and HER2 negative with a Ki67 of < 1%.  She had a left total knee replacement in 10/18 with Dr. Wynelle Link and continues to have pain in her left lateral knee. Her orthopedist reported it is likely due to her iliotibial band. Patient reports she underwent a left lumpectomy and SNLB (0/4 nodes positive) removed on 04/17/18.She completed radiation, but no chemo    Limitations Standing;Walking    How long can you stand comfortably? limited time before she has pain    How long can you walk comfortably? depends on the day    Diagnostic tests MRI of lumbar spine: L4 disc buldge with possible L5 nerve root impingement    Patient Stated Goals to get rid of the left armpit and arm pain    Currently in Pain? Yes    Pain Score 7     Pain Location Hip    Pain Orientation Right    Pain Descriptors / Indicators Aching    Pain Type Chronic pain    Pain Radiating Towards into right buttock    Pain Onset More than a month ago    Pain Frequency Constant    Aggravating Factors  standing and walking    Pain Relieving Factors nothing    Effect of Pain on Daily Activities difficulty perfroming ADLs    Multiple Pain Sites No                               [facility] Adult PT Treatment/Exercise - 05/21/21 0001       Manual Therapy   Myofascial Release trigger point release to right lateral hip                    PT Education - 05/20/21 2233     Education Details reviewed exercises in the water for her shoulder.    Person(s) Educated Patient    Methods Explanation;Demonstration;Tactile cues;Verbal cues    Comprehension Verbalized understanding;Returned demonstration;Verbal cues required;Tactile cues required              PT Short Term Goals - 05/07/21 1211       PT SHORT TERM GOAL #1   Title Pt will report that she has had a reduction of discomfort in her left axilla and lateral chest to 5/10    Baseline 6/10, 04/02/2021 5/10, 05/07/2021    Time 2    Period Weeks     Status Achieved      PT SHORT TERM GOAL #2   Title Pt will report she is doing a beginning home exericse program for postural  and strength exercises    Status Achieved               PT Long Term Goals - 05/07/21 1111       PT LONG TERM GOAL #1   Title Pt will have painfree left shoulder abduction of 145 degrees so that she can reach into a kitchen cabinet at home    Baseline 134 degrees with 6/10 pain on 02/17/2021, 160 degrees with 2/10 pain    Status Partially Met   much improved     PT LONG TERM GOAL #2   Title Pt will be independent in progressive resistive home exercise program for strength and fitness    Baseline ongoing as pt is participating  in aquatic therapy    Status On-going      PT LONG TERM GOAL #3   Title Pt will report that her overall pain and discomfort is decreased by 50%    Status Achieved             Pt seen for aquatic therapy today.  Treatment took place in water 3.25-4 ft in depth at the [employer] pool. Temp of water was 91.  Pt entered/exited the pool via stairs (step through pattern) independently with bilat rail.        Warm up: heel/toe walking x4 laps across pool chest deep  side stepping x4 laps from shallow to deep;  Long stride 4 laps   Backwards walking x4   Exercises; Slow march x20;  Kick back x20; Kick to the side;  squats x20; hip extension x20; hip abduction x20; Sit to stand x20;  Sitting kick out x20 each leg   Press noodle down in the water with good posture x20; push noodle back and fourth x20   Step up x20 step down x20 stide step x20 bilateral   Left arm side to side x20 ; forward and back x20;  press down x20;   Manual release to the right hip while supported on yellow kick board    Pt requires buoyancy for support and to offload joints with strengthening exercises. Viscosity of the water is needed for resistance of strengthening; water current perturbations provides challenge to standing balance  unsupported, requiring increased core activation.   Plan - 05/20/21 2237     Clinical Impression Statement Patietn was in more pain today but she was still able to complete her exercises. TPatient also worked on left UE exercsies today. Therapy advised her that at this time getting flaired up is not unexpected. the important thing is that she was able to walk and that she try to get the pain down over the next few days using the tools that she has to work with weither it be heat, stretching, or self soft tissue mobilization.    Personal Factors and Comorbidities Comorbidity 3+    Comorbidities Previous Lt TKR with persistent pain, Previous Lt breast lumpectomy and radiation, right sciatic pain    Examination-Activity Limitations Carry;Lift    Clinical Impairments Affecting Rehab Potential pain in left leg, pain in right leg, decreased tolerance of gait due to pain,    PT Frequency 1x / week    PT Duration 4 weeks    PT Treatment/Interventions ADLs/Self Care Home Management;Therapeutic activities;Therapeutic exercise;Patient/family education;Manual techniques;Manual lymph drainage;Scar mobilization;Passive range of motion;Moist Heat;Balance training;Functional mobility training;Orthotic Fit/Training;Taping;Compression bandaging;Aquatic Therapy;Cryotherapy;Neuromuscular re-education;Dry needling    PT Next Visit Plan continue with aquatic therapy buyt also if spasming continues consider needling with pulsed  PT Home Exercise Plan quad set, SLR,  prifriformis stretch; hamstring stretch;    Consulted and Agree with Plan of Care Patient             Patient will benefit from skilled therapeutic intervention in order to improve the following deficits and impairments:  Decreased knowledge of precautions, Impaired UE functional use, Decreased range of motion, Postural dysfunction, Decreased scar mobility, Decreased mobility, Pain, Increased fascial restricitons, Increased edema, Abnormal gait,  Difficulty walking, Decreased endurance, Decreased activity tolerance, Impaired perceived functional ability, Decreased knowledge of use of DME, Decreased strength  Visit Diagnosis: Other abnormalities of gait and mobility  Pain in right hip  Chronic pain of left knee     Problem List Patient Active Problem List   Diagnosis Date Noted   Breast mass, right 03/27/2020   Hepatic steatosis 01/22/2020   Genetic testing 04/02/2018   Family history of breast cancer    Malignant neoplasm of upper-outer quadrant of left breast in female, estrogen receptor positive ([place]) 03/16/2018   OA (osteoarthritis) of knee 09/11/2017   Infrapatellar bursitis of right knee 04/29/2014   Pneumonia, aspiration ([place]) 12/28/2012   HYPOTHYROIDISM 11/15/2010   HYPERLIPIDEMIA 11/15/2010   DEPRESSION 11/15/2010   GERD 11/15/2010   CONSTIPATION 11/15/2010   GALLSTONES 11/15/2010   RENAL CYST 11/15/2010   INSOMNIA UNSPECIFIED 11/15/2010   COUGH 11/15/2010   ABDOMINAL PAIN OTHER SPECIFIED SITE 11/15/2010   TRANSAMINASES, SERUM, ELEVATED 11/15/2010    Carney Living PT DPT T  05/21/2021, 11:43 AM  [place], [place], [address] Phone: [phone]   Fax:  [phone]  Name: [name] MRN: [id number] Date of Birth: 01-01-1946

## 2021-05-27 ENCOUNTER — Other Ambulatory Visit: Payer: Self-pay

## 2021-05-27 ENCOUNTER — Ambulatory Visit (HOSPITAL_BASED_OUTPATIENT_CLINIC_OR_DEPARTMENT_OTHER): Payer: Medicare Other | Admitting: Physical Therapy

## 2021-05-27 DIAGNOSIS — R2689 Other abnormalities of gait and mobility: Secondary | ICD-10-CM | POA: Diagnosis not present

## 2021-05-27 DIAGNOSIS — M25512 Pain in left shoulder: Secondary | ICD-10-CM | POA: Diagnosis not present

## 2021-05-27 DIAGNOSIS — G8929 Other chronic pain: Secondary | ICD-10-CM | POA: Diagnosis not present

## 2021-05-27 DIAGNOSIS — M25562 Pain in left knee: Secondary | ICD-10-CM | POA: Diagnosis not present

## 2021-05-27 DIAGNOSIS — M25551 Pain in right hip: Secondary | ICD-10-CM | POA: Diagnosis not present

## 2021-05-28 ENCOUNTER — Encounter (HOSPITAL_BASED_OUTPATIENT_CLINIC_OR_DEPARTMENT_OTHER): Payer: Self-pay | Admitting: Physical Therapy

## 2021-05-28 NOTE — Therapy (Signed)
[place], [place], [address] Phone: [phone]   Fax:  [phone]  Physical Therapy Treatment  Patient Details  Name: [name] MRN: [id number] Date of Birth: Oct 31, 1946 Referring Provider (PT): Dr Shon Baton   Encounter Date: 05/27/2021   PT End of Session - 05/28/21 1530     Visit Number 8    Number of Visits 12    Date for PT Re-Evaluation 06/12/21    Authorization Type begin KX 2nd to 15 visit limit reached    PT Start Time 1100    PT Stop Time 1143    PT Time Calculation (min) 43 min    Activity Tolerance Patient tolerated treatment well    Behavior During Therapy [facility] for tasks assessed/performed             Past Medical History:  Diagnosis Date   Arthritis    hands and knees   Complication of anesthesia    aspiration pna; following a colonoscopy, pt requests head of bed elevated if possible.   Constipation    Cough    Depression    Dysrhythmia    RBBB on 06-06-17 ekg    Elevated liver function tests    Esophageal stricture    Family history of breast cancer    Gallstones    Genetic testing 04/02/2018   STAT Breast panel with reflex to Multi-Cancer panel (83 genes) @ [facility] - No pathogenic mutations detected   GERD (gastroesophageal reflux disease)    Hiatal hernia    History of kidney stones    History of radiation therapy 07/09/2018- 08/03/18   Left Breast/ 40.05 Gy in 15 fractions. Left Breast boost 10 Gy in 5 fractions.    Hyperlipemia    Hypothyroidism    Insomnia    Malignant neoplasm of upper-outer quadrant of left female breast ([place])    Nephrolithiasis    Pneumonia 2015   aspirated after colonosopy   Renal cyst     Past Surgical History:  Procedure Laterality Date   BREAST BIOPSY Left 05/31/2019   mri   BREAST BIOPSY Left 04/2019   malignant   BREAST LUMPECTOMY Left 04/17/2018   malignant   BREAST LUMPECTOMY WITH RADIOACTIVE SEED AND SENTINEL LYMPH  NODE BIOPSY Left 04/17/2018   Procedure: LEFT BREAST LUMPECTOMY WITH BRACKETED RADIOACTIVE SEEDS AND SENTINEL LYMPH NODE BIOPSY;  Surgeon: Rolm Bookbinder, MD;  Location: [facility];  Service: General;  Laterality: Left;   BREAST SURGERY Left    Lumpectomy   CARDIOVASCULAR STRESS TEST  07/19/2006   EF 70%, NO EVIDENCE OF ISCHEMIA   CHOLECYSTECTOMY  12/30/10   COLONOSCOPY     DILATION AND CURETTAGE OF UTERUS     HYSTEROSCOPY WITH D & C N/A 06/07/2018   Procedure: DILATATION AND CURETTAGE /HYSTEROSCOPY;  Surgeon: [name], MD;  Location: [facility];  Service: Gynecology;  Laterality: N/A;   inguinal herniography     NASAL SINUS SURGERY     RADIOACTIVE SEED GUIDED EXCISIONAL BREAST BIOPSY Right 04/17/2018   Procedure: RIGHT BREAST SEED GUIDED EXCISIONAL BIOPSY;  Surgeon: Rolm Bookbinder, MD;  Location: [facility];  Service: General;  Laterality: Right;   RE-EXCISION OF BREAST LUMPECTOMY Left 05/15/2018   Procedure: RE-EXCISION OF LEFT BREAST LUMPECTOMY, ASPIRATION OF LEFT AXILLARY SEROMA;  Surgeon: Rolm Bookbinder, MD;  Location: [facility];  Service: General;  Laterality: Left;   T & A     TOOTH EXTRACTION  TOTAL KNEE ARTHROPLASTY Left 09/11/2017   Procedure: LEFT TOTAL KNEE ARTHROPLASTY;  Surgeon: Gaynelle Arabian, MD;  Location: [facility];  Service: Orthopedics;  Laterality: Left;  with block   US ECHOCARDIOGRAPHY  07/17/2006   EF 55-60%    There were no vitals filed for this visit.   Subjective Assessment - 05/27/21 1138     Subjective Pt reports she had her 2nd covid booster shot yesterday.  She felt achy all day and didn't do much.  She feels better today.  pt states her L knee is still bothering her.  Pt states she felt good after prior Rx and always feels good after aquatic treatments.    Pertinent History Patient was diagnosed on 03/08/18 with left grade 1-2 invasive ductal carcinoma breast cancer.It measured 1.5 cm and is located in the  upper outer quadrant. It is ER/PR positive and HER2 negative with a Ki67 of < 1%.  She had a left total knee replacement in 10/18 with Dr. Wynelle Link and continues to have pain in her left lateral knee. Her orthopedist reported it is likely due to her iliotibial band. Patient reports she underwent a left lumpectomy and SNLB (0/4 nodes positive) removed on 04/17/18.She completed radiation, but no chemo    Limitations Standing;Walking    How long can you stand comfortably? limited time before she has pain    How long can you walk comfortably? depends on the day    Diagnostic tests MRI of lumbar spine: L4 disc buldge with possible L5 nerve root impingement    Patient Stated Goals to get rid of the left armpit and arm pain    Currently in Pain? Yes    Pain Score 8     Pain Location Knee    Pain Orientation Left    Pain Descriptors / Indicators Aching    Pain Type Chronic pain    Pain Onset More than a month ago    Pain Frequency Several days a week    Aggravating Factors  standing and walking    Pain Relieving Factors nothing    Multiple Pain Sites No                               [facility] Adult PT Treatment/Exercise - 05/28/21 0001       Knee/Hip Exercises: Stretches   Other Knee/Hip Stretches piriformis stretch 3x20 sec hold; tennis ball trigger point release to gluteal    Other Knee/Hip Stretches LTR 2x10 in limited positions      Knee/Hip Exercises: Supine   Other Supine Knee/Hip Exercises clamshell red 2x10    Other Supine Knee/Hip Exercises bilateral hip flexion 2x10; bridging 2x10      Manual Therapy   Myofascial Release trigger point release to right lateral hip    Manual Traction LAD grade II and III ocicaltions to bilateral hips              Trigger Point Dry Needling - 05/28/21 0001     Consent Given? Yes    Education Handout Provided Yes    Muscles Treated Back/Hip Gluteus medius    Dry Needling Comments 3 spots needled in the glutreal without  agressive pistoning. good twtich noted    Gluteus Medius Response Twitch response elicited;Palpable increased muscle length                  PT Education - 05/28/21 1529     Education Details benefits  and risks of TPDN    Person(s) Educated Patient    Methods Explanation;Demonstration;Tactile cues;Verbal cues    Comprehension Verbalized understanding;Returned demonstration;Verbal cues required;Tactile cues required              PT Short Term Goals - 05/07/21 1211       PT SHORT TERM GOAL #1   Title Pt will report that she has had a reduction of discomfort in her left axilla and lateral chest to 5/10    Baseline 6/10, 04/02/2021 5/10, 05/07/2021    Time 2    Period Weeks    Status Achieved      PT SHORT TERM GOAL #2   Title Pt will report she is doing a beginning home exericse program for postural and strength exercises    Status Achieved               PT Long Term Goals - 05/07/21 1111       PT LONG TERM GOAL #1   Title Pt will have painfree left shoulder abduction of 145 degrees so that she can reach into a kitchen cabinet at home    Baseline 134 degrees with 6/10 pain on 02/17/2021, 160 degrees with 2/10 pain    Status Partially Met   much improved     PT LONG TERM GOAL #2   Title Pt will be independent in progressive resistive home exercise program for strength and fitness    Baseline ongoing as pt is participating  in aquatic therapy    Status On-going      PT LONG TERM GOAL #3   Title Pt will report that her overall pain and discomfort is decreased by 50%    Status Achieved                   Plan - 05/28/21 1531     Clinical Impression Statement Patient has had a bad expeirience in the past with agressive needling. Therapy perfromed needling to her gluteal without agressive pistoning. She tolerated much better. She has a significant trigger point in her gluteal. If she tolerates well overall she may benefit from needling of the pirifomris  and lumbar spine. She was shown light exerciases    Personal Factors and Comorbidities Comorbidity 3+    Comorbidities Previous Lt TKR with persistent pain, Previous Lt breast lumpectomy and radiation, right sciatic pain    Stability/Clinical Decision Making Evolving/Moderate complexity    Clinical Decision Making Moderate    Rehab Potential Good    Clinical Impairments Affecting Rehab Potential pain in left leg, pain in right leg, decreased tolerance of gait due to pain,    PT Frequency 1x / week    PT Duration 4 weeks    PT Treatment/Interventions ADLs/Self Care Home Management;Therapeutic activities;Therapeutic exercise;Patient/family education;Manual techniques;Manual lymph drainage;Scar mobilization;Passive range of motion;Moist Heat;Balance training;Functional mobility training;Orthotic Fit/Training;Taping;Compression bandaging;Aquatic Therapy;Cryotherapy;Neuromuscular re-education;Dry needling    PT Next Visit Plan continue with aquatic therapy buyt also if spasming continues consider needling with pulsed    PT Home Exercise Plan quad set, SLR,  prifriformis stretch; hamstring stretch;    Consulted and Agree with Plan of Care Patient             Patient will benefit from skilled therapeutic intervention in order to improve the following deficits and impairments:  Decreased knowledge of precautions, Impaired UE functional use, Decreased range of motion, Postural dysfunction, Decreased scar mobility, Decreased mobility, Pain, Increased fascial restricitons, Increased edema, Abnormal gait, Difficulty walking,  Decreased endurance, Decreased activity tolerance, Impaired perceived functional ability, Decreased knowledge of use of DME, Decreased strength  Visit Diagnosis: Other abnormalities of gait and mobility  Pain in right hip  Chronic pain of left knee  Chronic left shoulder pain     Problem List Patient Active Problem List   Diagnosis Date Noted   Breast mass, right  03/27/2020   Hepatic steatosis 01/22/2020   Genetic testing 04/02/2018   Family history of breast cancer    Malignant neoplasm of upper-outer quadrant of left breast in female, estrogen receptor positive ([place]) 03/16/2018   OA (osteoarthritis) of knee 09/11/2017   Infrapatellar bursitis of right knee 04/29/2014   Pneumonia, aspiration ([place]) 12/28/2012   HYPOTHYROIDISM 11/15/2010   HYPERLIPIDEMIA 11/15/2010   DEPRESSION 11/15/2010   GERD 11/15/2010   CONSTIPATION 11/15/2010   GALLSTONES 11/15/2010   RENAL CYST 11/15/2010   INSOMNIA UNSPECIFIED 11/15/2010   COUGH 11/15/2010   ABDOMINAL PAIN OTHER SPECIFIED SITE 11/15/2010   TRANSAMINASES, SERUM, ELEVATED 11/15/2010    Carney Living PT DPT  05/28/2021, 3:49 PM  [facility] [address] [place], [place], [address] Phone: [phone]   Fax:  [phone]  Name: [name] MRN: [id number] Date of Birth: November 22, 1946

## 2021-06-01 NOTE — Progress Notes (Signed)
[facility]  [phone] [phone]     ID: [name] DOB: [birth year]  MR#: [id number]  EMV#:[id number]  Patient Care Team: Shon Baton, MD as PCP - General Rolm Bookbinder, MD as Consulting Physician (General Surgery) Karishma Unrein, Virgie Dad, MD as Consulting Physician (Oncology) Eppie Gibson, MD as Attending Physician (Radiation Oncology) Lelon Perla, MD as Consulting Physician (Cardiology) [name], MD as Consulting Physician (Obstetrics and Gynecology) Jacquelynn Cree, PT as Physical Therapist (Physical Therapy) Donzetta Sprung., MD as Referring Physician (Sports Medicine) OTHER MD:   CHIEF COMPLAINT: Estrogen receptor positive breast cancer  CURRENT TREATMENT: Currently off treatment   INTERVAL HISTORY: [name] returns today for follow-up of her estrogen receptor positive breast cancer.    She was switched to anastrozole, started on 03/15/2021.    She has some cognitive issues but they are no worse than before.   She has developed arthralgias particularly involving the hands.  She describes her hand pain in the morning is "terrible".  She takes 2 Advil to deal with this and "it does get better eventually".  Her most recent bone density screening from 02/18/2021 showed a T-score of -1.8, which is considered osteopenic.  On 01/26/2021 she had a CT of the abdomen and pelvis without contrast which showed small nonobstructive calculi in the inferior pole of the right kidney without hydronephrosis.  There was no evidence of cancer in the study   REVIEW OF SYSTEMS: [name] and her husband continue to worry that she may be developing dementia or that the medication may be causing her to be foggy headed.  They went to [place] and she forgot her purse so they had to drive back.  Her husband she says was very upset about this and wanted to make sure I was aware of this.  Hot flashes are moderate.  A detailed review of systems today was  otherwise   COVID 19 VACCINATION STATUS: Status post Moderna x2, most recently February 2021, with booster October 2022    HISTORY OF CURRENT ILLNESS: From the original intake note:  "[name]" had routine diagnostic mammography on 03/08/2018 showing breast density category D.  There was a 1.5 cm irregular mass in the left upper outer quadrant posterior depth.  This was further imaged with ultrasonography on the same day.  The left axilla was sonographically benign.  No abnormalities were seen in the left axilla.  Accordingly on 03/08/2018 she proceeded to biopsy of the left breast area in question. The pathology from this procedure showed ([id number]): Invasive ductal carcinoma, grade I-II. HER-2 negative with the ratio being 1.61 and number per cell 3.30.  The tumor was estrogen receptor 100% positive and progesterone receptor 100% positive, both with strong staining intensity.  Ki67 was <1%.   On 03/18/2018, she underwent a bilateral breast MRI with and without contrast, showing the mass in question in a setting of 3.3 cm non-masslike enhancement in the posterior aspect of the left upper-outer quadrant. in addition there was a 1.1 cm mass in the left upper-outer quadrant located 2.1 cm anterior and lateral to the biopsy proven malignant.   Right breast showed two indeterminate 5 mm adjacent enhancing nodules in the upper-inner quadrant.   The patient's subsequent history is as detailed below.   PAST MEDICAL HISTORY: Past Medical History:  Diagnosis Date   Arthritis    hands and knees   Complication of anesthesia    aspiration pna; following a colonoscopy, pt requests head of bed elevated if  possible.   Constipation    Cough    Depression    Dysrhythmia    RBBB on 06-06-17 ekg    Elevated liver function tests    Esophageal stricture    Family history of breast cancer    Gallstones    Genetic testing 04/02/2018   STAT Breast panel with reflex to Multi-Cancer panel (83 genes) @ [facility] -  No pathogenic mutations detected   GERD (gastroesophageal reflux disease)    Hiatal hernia    History of kidney stones    History of radiation therapy 07/09/2018- 08/03/18   Left Breast/ 40.05 Gy in 15 fractions. Left Breast boost 10 Gy in 5 fractions.    Hyperlipemia    Hypothyroidism    Insomnia    Malignant neoplasm of upper-outer quadrant of left female breast ([facility])    Nephrolithiasis    Pneumonia 2015   aspirated after colonosopy   Renal cyst     PAST SURGICAL HISTORY: Past Surgical History:  Procedure Laterality Date   BREAST BIOPSY Left 05/31/2019   mri   BREAST BIOPSY Left 04/2019   malignant   BREAST LUMPECTOMY Left 04/17/2018   malignant   BREAST LUMPECTOMY WITH RADIOACTIVE SEED AND SENTINEL LYMPH NODE BIOPSY Left 04/17/2018   Procedure: LEFT BREAST LUMPECTOMY WITH BRACKETED RADIOACTIVE SEEDS AND SENTINEL LYMPH NODE BIOPSY;  Surgeon: Rolm Bookbinder, MD;  Location: [facility];  Service: General;  Laterality: Left;   BREAST SURGERY Left    Lumpectomy   CARDIOVASCULAR STRESS TEST  07/19/2006   EF 70%, NO EVIDENCE OF ISCHEMIA   CHOLECYSTECTOMY  12/30/10   COLONOSCOPY     DILATION AND CURETTAGE OF UTERUS     HYSTEROSCOPY WITH D & C N/A 06/07/2018   Procedure: DILATATION AND CURETTAGE /HYSTEROSCOPY;  Surgeon: [name], MD;  Location: [facility];  Service: Gynecology;  Laterality: N/A;   inguinal herniography     NASAL SINUS SURGERY     RADIOACTIVE SEED GUIDED EXCISIONAL BREAST BIOPSY Right 04/17/2018   Procedure: RIGHT BREAST SEED GUIDED EXCISIONAL BIOPSY;  Surgeon: Rolm Bookbinder, MD;  Location: [facility];  Service: General;  Laterality: Right;   RE-EXCISION OF BREAST LUMPECTOMY Left 05/15/2018   Procedure: RE-EXCISION OF LEFT BREAST LUMPECTOMY, ASPIRATION OF LEFT AXILLARY SEROMA;  Surgeon: Rolm Bookbinder, MD;  Location: [facility];  Service: General;  Laterality: Left;   T & A     TOOTH EXTRACTION     TOTAL KNEE  ARTHROPLASTY Left 09/11/2017   Procedure: LEFT TOTAL KNEE ARTHROPLASTY;  Surgeon: Gaynelle Arabian, MD;  Location: [facility];  Service: Orthopedics;  Laterality: Left;  with block   US ECHOCARDIOGRAPHY  07/17/2006   EF 55-60%    FAMILY HISTORY Family History  Problem Relation Age of Onset   Breast cancer Mother 31       deceased 54; TAH/BSO in 71s/50s   Dementia Father        deceased 76   Breast cancer Maternal Aunt 82       deceased 58s   Colon cancer Neg Hx    Stomach cancer Neg Hx   Her father died of old age at 67 years old. Her mother had breast cancer at 78 and died at 75 years old. She will undergo genetic testing. Her maternal aunt had breast cancer at 65 years old. No family history of ovarian cancer. She had one brother and one sister. The patient's sister Marcelina Morel is also my patient   GYNECOLOGIC  HISTORY:  No LMP recorded. Patient is postmenopausal. Menarche: 75 years old [place] P 0 LMP: 1997 Contraceptive: 2 years HRT: Estrogen and progesterone for about 7 years  Hysterectomy? No   SOCIAL HISTORY:  She is a retired Futures trader. She has been married to [place], for 45 plus years. He worked in the [employer]. Together they adopted two children. One daughter, [name], 8 of [place], who is a Psychologist, sport and exercise for an OBGYN, and one son, Soniyah Mcglory, 36 of [place], who owns a Marketing executive business. The patient has three grandchildren. She attends [employer].    ADVANCED DIRECTIVES: In place   HEALTH MAINTENANCE: Social History   Tobacco Use   Smoking status: Never   Smokeless tobacco: Never  Vaping Use   Vaping Use: Never used  Substance Use Topics   Alcohol use: No   Drug use: No    Colonoscopy: 2014/ [facility]  PAP: 2018  Bone density:  May 2014 at [facility] showed a T score of -0.9 normal   Allergies  Allergen Reactions   Oxycontin [Oxycodone Hcl]     "it made me feel very high and hyper"    Codeine Nausea And Vomiting     Current Outpatient Medications  Medication Sig Dispense Refill   acetaminophen (TYLENOL) 500 MG tablet Take 500 mg by mouth every 6 (six) hours as needed for mild pain or headache. (Patient not taking: Reported on 02/17/2021)     anastrozole (ARIMIDEX) 1 MG tablet Take 1 tablet (1 mg total) by mouth daily. 90 tablet 4   azelastine (ASTELIN) 0.1 % nasal spray Place 1 spray into both nostrils 2 (two) times daily. Use in each nostril as directed     Cholecalciferol (VITAMIN D3) 1000 units CAPS Take 1 capsule by mouth 2 (two) times daily.      escitalopram (LEXAPRO) 10 MG tablet Take 15 mg by mouth daily.     esomeprazole (NEXIUM) 40 MG capsule Take 40 mg by mouth 2 (two) times daily before a meal.     ezetimibe (ZETIA) 10 MG tablet Take 10 mg by mouth at bedtime.     fluticasone (FLONASE) 50 MCG/ACT nasal spray Place 1 spray into both nostrils daily.     levothyroxine (SYNTHROID, LEVOTHROID) 50 MCG tablet Take 50 mcg by mouth at bedtime.     Magnesium 250 MG TABS Take 1 tablet by mouth daily.      naproxen sodium (ALEVE) 220 MG tablet Take 220 mg by mouth 2 (two) times daily as needed (pain).     OVER THE COUNTER MEDICATION Apply 1 application topically daily as needed (pain). Natural pain relief ointment     Probiotic Product (PROBIOTIC ADVANCED PO) Take 1 tablet by mouth daily.     rosuvastatin (CRESTOR) 5 MG tablet Take 5 mg by mouth at bedtime.      sodium chloride (OCEAN) 0.65 % SOLN nasal spray Place 4 sprays into both nostrils 4 (four) times daily.     SUPER B COMPLEX/C PO Take 1 tablet by mouth daily.     zolpidem (AMBIEN) 10 MG tablet zolpidem 10 mg tablet  take 1 tablet by mouth at bedtime if needed     No current facility-administered medications for this visit.    OBJECTIVE: white woman in no acute distress Vitals:   06/02/21 1536  BP: 126/65  Pulse: 86  Resp: 18  Temp: 98.1 F (36.7 C)  SpO2: 97%     Body mass index is 24.88  kg/m.   Wt Readings from Last 3 Encounters:   06/02/21 163 lb 9.6 oz (74.2 kg)  12/24/20 165 lb 3.2 oz (74.9 kg)  10/08/20 159 lb (72.1 kg)      ECOG FS:1 - Symptomatic but completely ambulatory  Sclerae unicteric, EOMs intact Wearing a mask No cervical or supraclavicular adenopathy Lungs no rales or rhonchi Heart regular rate and rhythm Abd soft, nontender, positive bowel sounds MSK no focal spinal tenderness, no upper extremity lymphedema Neuro: nonfocal, well oriented, appropriate affect Breasts: The right breast is status post benign lumpectomy.  The left breast is status postlumpectomy and radiation.  There is no evidence of local recurrence.  Both axillae are benign.   LAB RESULTS:  CMP     Component Value Date/Time   NA 140 01/23/2021 1644   NA 144 06/06/2017 1448   K 3.6 01/23/2021 1644   CL 106 01/23/2021 1644   CO2 23 01/23/2021 1644   GLUCOSE 116 (H) 01/23/2021 1644   BUN 12 01/23/2021 1644   BUN 12 06/06/2017 1448   CREATININE 0.85 01/23/2021 1644   CREATININE 0.80 11/26/2019 1024   CALCIUM 9.4 01/23/2021 1644   PROT 6.9 12/24/2020 0840   PROT 6.3 06/06/2017 1448   ALBUMIN 4.1 12/24/2020 0840   ALBUMIN 4.4 06/06/2017 1448   AST 20 12/24/2020 0840   AST 45 (H) 11/26/2019 1024   ALT 18 12/24/2020 0840   ALT 31 11/26/2019 1024   ALKPHOS 76 12/24/2020 0840   BILITOT 0.7 12/24/2020 0840   BILITOT 0.7 11/26/2019 1024   GFRNONAA >60 01/23/2021 1644   GFRNONAA >60 11/26/2019 1024   GFRAA >60 06/29/2020 1237   GFRAA >60 11/26/2019 1024    No results found for: TOTALPROTELP, ALBUMINELP, A1GS, A2GS, BETS, BETA2SER, GAMS, MSPIKE, SPEI  No results found for: KPAFRELGTCHN, LAMBDASER, KAPLAMBRATIO  Lab Results  Component Value Date   WBC 9.0 06/02/2021   NEUTROABS 4.1 06/02/2021   HGB 12.2 06/02/2021   HCT 36.8 06/02/2021   MCV 87.0 06/02/2021   PLT 250 06/02/2021   No results found for: LABCA2  No components found for: [id number]  No results for input(s): INR in the last 168 hours.  No results  found for: LABCA2  No results found for: XYV859  No results found for: YTW446  No results found for: KMM381  No results found for: CA2729  No components found for: HGQUANT  No results found for: CEA1 / No results found for: CEA1   No results found for: AFPTUMOR  No results found for: CHROMOGRNA  No results found for: HGBA, HGBA2QUANT, HGBFQUANT, HGBSQUAN (Hemoglobinopathy evaluation)   No results found for: LDH  No results found for: IRON, TIBC, IRONPCTSAT (Iron and TIBC)  No results found for: FERRITIN  Urinalysis    Component Value Date/Time   COLORURINE YELLOW 08/22/2016 1947   APPEARANCEUR CLEAR 08/22/2016 1947   LABSPEC 1.026 08/22/2016 1947   PHURINE 5.5 08/22/2016 1947   GLUCOSEU NEGATIVE 08/22/2016 1947   HGBUR SMALL (A) 08/22/2016 1947   BILIRUBINUR NEGATIVE 08/22/2016 [place] 08/22/2016 1947   PROTEINUR NEGATIVE 08/22/2016 1947   UROBILINOGEN 0.2 08/22/2016 1622   NITRITE NEGATIVE 08/22/2016 1947   LEUKOCYTESUR SMALL (A) 08/22/2016 1947    STUDIES: No results found.   ASSESSMENT: 75 y.o. [place] woman status post left breast upper outer quadrant biopsy 03/08/2018 for a clinical T1c-T2 N0, stage I invasive ductal carcinoma, grade 1 or 2, estrogen and progesterone receptor positive, HER-2 not amplified, with  an MIB-1 of less than 1%  (1) additional biopsies as follows:  (a) right breast biopsy ([id number]) on 04/02/2018 showed atypical lobular hyperplasia  (b) left breast ([id number]) on 03/22/2018 showed invasive lobular carcinoma, grade 2, estrogen and progesterone receptor positive with an MIB-1 of 5%. HER-2 not amplified  (2) bilateral lumpectomies and left sentinel lymph node sampling 04/17/2018 found  (a) on the right side, atypical lobular hyperplasia  (b) on the left side, an mpT1c pN0, stage IA, grade 2 invasive lobular carcinoma, with positive margins  (c) additional left breast surgery on 05/15/2018 cleared the  compromise margins  (3) The Oncotype DX score was 14, predicting a risk of outside the breast recurrence over the next 9 years of 4% if the patient's only systemic therapy is tamoxifen for 5 years.  It also predicts no significant benefit from chemotherapy.  (4) adjuvant radiation 07/09/2018 - 08/03/2018  Site/dose:    1. Left Breast / 40.05 Gy in 15 fractions 2. Left Breast Boost / 10 Gy in 5 fractions  (5) started tamoxifen 09/11/2018, discontinued June 2020 with cognitive issues   (6) genetics testing 03/22/2018 through [facility]'s STAT Breast panel with reflex to Multi-Cancer panel found no pathogenic mutations in (ALK, APC, ATM, AXIN2, BAP1, BARD1, BLM, BMPR1A, BRCA1, BRCA2, BRIP1, CASR, CDC73, CDH1, CDK4, CDKN1B, CDKN1C, CDKN2A, CEBPA, CHEK2, CTNNA1, DICER1, DIS3L2, EGFR, EPCAM, FH, FLCN, GATA2, GPC3, GREM1, HOXB13, HRAS, KIT, MAX, MEN1, MET, MITF, MLH1, MSH2, MSH3, MSH6, MUTYH, NBN, NF1, NF2, NTHL1, PALB2, PDGFRA, PHOX2B, PMS2, POLD1, POLE, POT1, PRKAR1A, PTCH1, PTEN, RAD50, RAD51C, RAD51D, RB1, RECQL4, RET, RUNX1, SDHA, SDHAF2, SDHB, SDHC, SDHD, SMAD4, SMARCA4, SMARCB1, SMARCE1, STK11, SUFU, TERC, TERT, TMEM127, TP53, TSC1, TSC2, VHL, WRN, WT1).  (a) Variants of Uncertain Significance were found in in PDGFRA and RECQL4.  (7) started letrozole 09/26/2019, discontinued 11/26/2019 with cognitive issues  (a) bone density May 2014 normal, T score -0.9  (b) letrozole resumed 2020-01-22, taken Mondays, Wednesday, Friday, as being off medication made no difference to either hot flashes or cognitive deficiencies  (c) letrozole discontinued October 2021 with multiple side effects  (8) exemestane prescribed 12/24/2020, discontinued mid March 2022 with intolerable side effect  (9) anastrozole started 03/15/2021, held mid June with concerns regarding cognitive dysfunction and severe arthralgias/myalgias.   PLAN: [name] continues to be very motivated to take anastrozole but I do not think she is  really going to be able to continue on it with the pain that she is describing in her hands.  Of course she also has pain in her knees and back which is not related to the medication.  In addition she is very concerned that she is a little bit more foggy headed on this medication than off it.  This is an ongoing concern and has been an issue with every antiestrogen we have tried.  Is again suggested we switch to Faslodex.  She tells me she is very worried this will worsen her sciatica.  My suggestion to her is that we go off anastrozole at this point and reassess in 3 months.  2 months from now it would be very helpful if we could have a neuropsychiatric evaluation to assess her baseline cognitive function.  We could then repeat the test after she has been on an antiestrogen 2 or 3 months and see if there has been any change.  She is agreeable to this.  Accordingly the plan is to stop anastrozole now, obtain a psychology evaluation in mid August, and return to see me  in about 3 months.  Total encounter time 35 minutes.*  Wanda Rideout, Virgie Dad, MD  06/02/21 3:38 PM Medical Oncology and Hematology [facility] [place], [place] [address] Tel. [phone]    Fax. [phone]   I, Wilburn Mylar, am acting as scribe for Dr. Virgie Dad. Henrine Hayter.  I, Lurline Del MD, have reviewed the above documentation for accuracy and completeness, and I agree with the above.   *Total Encounter Time as defined by the Centers for Medicare and Medicaid Services includes, in addition to the face-to-face time of a patient visit (documented in the note above) non-face-to-face time: obtaining and reviewing outside history, ordering and reviewing medications, tests or procedures, care coordination (communications with other health care professionals or caregivers) and documentation in the medical record.

## 2021-06-02 ENCOUNTER — Inpatient Hospital Stay: Payer: Medicare Other | Attending: Oncology

## 2021-06-02 ENCOUNTER — Other Ambulatory Visit: Payer: Self-pay

## 2021-06-02 ENCOUNTER — Inpatient Hospital Stay (HOSPITAL_BASED_OUTPATIENT_CLINIC_OR_DEPARTMENT_OTHER): Payer: Medicare Other | Admitting: Oncology

## 2021-06-02 ENCOUNTER — Encounter: Payer: Self-pay | Admitting: Oncology

## 2021-06-02 VITALS — BP 126/65 | HR 86 | Temp 98.1°F | Resp 18 | Wt 163.6 lb

## 2021-06-02 DIAGNOSIS — E785 Hyperlipidemia, unspecified: Secondary | ICD-10-CM | POA: Diagnosis not present

## 2021-06-02 DIAGNOSIS — C50412 Malignant neoplasm of upper-outer quadrant of left female breast: Secondary | ICD-10-CM

## 2021-06-02 DIAGNOSIS — E039 Hypothyroidism, unspecified: Secondary | ICD-10-CM | POA: Insufficient documentation

## 2021-06-02 DIAGNOSIS — Z923 Personal history of irradiation: Secondary | ICD-10-CM | POA: Diagnosis not present

## 2021-06-02 DIAGNOSIS — Z79899 Other long term (current) drug therapy: Secondary | ICD-10-CM | POA: Diagnosis not present

## 2021-06-02 DIAGNOSIS — Z170 Estrogen receptor positive status [ER+]: Secondary | ICD-10-CM | POA: Insufficient documentation

## 2021-06-02 DIAGNOSIS — Z79811 Long term (current) use of aromatase inhibitors: Secondary | ICD-10-CM | POA: Insufficient documentation

## 2021-06-02 LAB — COMPREHENSIVE METABOLIC PANEL
ALT: 32 U/L (ref 0–44)
AST: 39 U/L (ref 15–41)
Albumin: 4 g/dL (ref 3.5–5.0)
Alkaline Phosphatase: 86 U/L (ref 38–126)
Anion gap: 9 (ref 5–15)
BUN: 14 mg/dL (ref 8–23)
CO2: 25 mmol/L (ref 22–32)
Calcium: 9.6 mg/dL (ref 8.9–10.3)
Chloride: 107 mmol/L (ref 98–111)
Creatinine, Ser: 0.78 mg/dL (ref 0.44–1.00)
GFR, Estimated: >60 mL/min (ref >60)
Glucose, Bld: 113 mg/dL — ABNORMAL HIGH (ref 70–99)
Potassium: 4 mmol/L (ref 3.5–5.1)
Sodium: 141 mmol/L (ref 135–145)
Total Bilirubin: 0.5 mg/dL (ref 0.3–1.2)
Total Protein: 6.5 g/dL (ref 6.5–8.1)

## 2021-06-02 LAB — CBC WITH DIFFERENTIAL/PLATELET
Abs Immature Granulocytes: 0.02 10*3/uL (ref 0.00–0.07)
Basophils Absolute: 0.1 10*3/uL (ref 0.0–0.1)
Basophils Relative: 1 %
Eosinophils Absolute: 0.7 10*3/uL — ABNORMAL HIGH (ref 0.0–0.5)
Eosinophils Relative: 7 %
HCT: 36.8 % (ref 36.0–46.0)
Hemoglobin: 12.2 g/dL (ref 12.0–15.0)
Immature Granulocytes: 0 %
Lymphocytes Relative: 38 %
Lymphs Abs: 3.5 10*3/uL (ref 0.7–4.0)
MCH: 28.8 pg (ref 26.0–34.0)
MCHC: 33.2 g/dL (ref 30.0–36.0)
MCV: 87 fL (ref 80.0–100.0)
Monocytes Absolute: 0.7 10*3/uL (ref 0.1–1.0)
Monocytes Relative: 8 %
Neutro Abs: 4.1 10*3/uL (ref 1.7–7.7)
Neutrophils Relative %: 46 %
Platelets: 250 10*3/uL (ref 150–400)
RBC: 4.23 MIL/uL (ref 3.87–5.11)
RDW: 13.7 % (ref 11.5–15.5)
WBC: 9 10*3/uL (ref 4.0–10.5)
nRBC: 0 % (ref 0.0–0.2)

## 2021-06-03 ENCOUNTER — Ambulatory Visit (HOSPITAL_BASED_OUTPATIENT_CLINIC_OR_DEPARTMENT_OTHER): Payer: Medicare Other | Admitting: Physical Therapy

## 2021-06-03 ENCOUNTER — Telehealth: Payer: Self-pay | Admitting: *Deleted

## 2021-06-03 ENCOUNTER — Other Ambulatory Visit: Payer: Self-pay | Admitting: *Deleted

## 2021-06-03 ENCOUNTER — Encounter (HOSPITAL_BASED_OUTPATIENT_CLINIC_OR_DEPARTMENT_OTHER): Payer: Self-pay | Admitting: Physical Therapy

## 2021-06-03 DIAGNOSIS — M25551 Pain in right hip: Secondary | ICD-10-CM | POA: Diagnosis not present

## 2021-06-03 DIAGNOSIS — R2689 Other abnormalities of gait and mobility: Secondary | ICD-10-CM | POA: Diagnosis not present

## 2021-06-03 DIAGNOSIS — M25562 Pain in left knee: Secondary | ICD-10-CM

## 2021-06-03 DIAGNOSIS — M25512 Pain in left shoulder: Secondary | ICD-10-CM | POA: Diagnosis not present

## 2021-06-03 DIAGNOSIS — G8929 Other chronic pain: Secondary | ICD-10-CM | POA: Diagnosis not present

## 2021-06-03 NOTE — Therapy (Signed)
[place], [place], [address] Phone: [phone]   Fax:  [phone]  Physical Therapy Treatment  Patient Details  Name: [name] MRN: [id number] Date of Birth: 09-15-1946 Referring Provider (PT): Dr Shon Baton   Encounter Date: 06/03/2021   PT End of Session - 06/03/21 2118     Visit Number 9    Number of Visits 12    Date for PT Re-Evaluation 06/12/21    Authorization Type begin KX 2nd to 15 visit limit reached    PT Start Time 1015    PT Stop Time 1100    PT Time Calculation (min) 45 min    Activity Tolerance Patient tolerated treatment well    Behavior During Therapy [facility] for tasks assessed/performed             Past Medical History:  Diagnosis Date   Arthritis    hands and knees   Complication of anesthesia    aspiration pna; following a colonoscopy, pt requests head of bed elevated if possible.   Constipation    Cough    Depression    Dysrhythmia    RBBB on 06-06-17 ekg    Elevated liver function tests    Esophageal stricture    Family history of breast cancer    Gallstones    Genetic testing 04/02/2018   STAT Breast panel with reflex to Multi-Cancer panel (83 genes) @ [facility] - No pathogenic mutations detected   GERD (gastroesophageal reflux disease)    Hiatal hernia    History of kidney stones    History of radiation therapy 07/09/2018- 08/03/18   Left Breast/ 40.05 Gy in 15 fractions. Left Breast boost 10 Gy in 5 fractions.    Hyperlipemia    Hypothyroidism    Insomnia    Malignant neoplasm of upper-outer quadrant of left female breast ([place])    Nephrolithiasis    Pneumonia 2015   aspirated after colonosopy   Renal cyst     Past Surgical History:  Procedure Laterality Date   BREAST BIOPSY Left 05/31/2019   mri   BREAST BIOPSY Left 04/2019   malignant   BREAST LUMPECTOMY Left 04/17/2018   malignant   BREAST LUMPECTOMY WITH RADIOACTIVE SEED AND SENTINEL LYMPH  NODE BIOPSY Left 04/17/2018   Procedure: LEFT BREAST LUMPECTOMY WITH BRACKETED RADIOACTIVE SEEDS AND SENTINEL LYMPH NODE BIOPSY;  Surgeon: Rolm Bookbinder, MD;  Location: [place];  Service: General;  Laterality: Left;   BREAST SURGERY Left    Lumpectomy   CARDIOVASCULAR STRESS TEST  07/19/2006   EF 70%, NO EVIDENCE OF ISCHEMIA   CHOLECYSTECTOMY  12/30/10   COLONOSCOPY     DILATION AND CURETTAGE OF UTERUS     HYSTEROSCOPY WITH D & C N/A 06/07/2018   Procedure: DILATATION AND CURETTAGE /HYSTEROSCOPY;  Surgeon: [name], MD;  Location: [facility];  Service: Gynecology;  Laterality: N/A;   inguinal herniography     NASAL SINUS SURGERY     RADIOACTIVE SEED GUIDED EXCISIONAL BREAST BIOPSY Right 04/17/2018   Procedure: RIGHT BREAST SEED GUIDED EXCISIONAL BIOPSY;  Surgeon: Rolm Bookbinder, MD;  Location: [place];  Service: General;  Laterality: Right;   RE-EXCISION OF BREAST LUMPECTOMY Left 05/15/2018   Procedure: RE-EXCISION OF LEFT BREAST LUMPECTOMY, ASPIRATION OF LEFT AXILLARY SEROMA;  Surgeon: Rolm Bookbinder, MD;  Location: [place];  Service: General;  Laterality: Left;   T & A     TOOTH EXTRACTION  TOTAL KNEE ARTHROPLASTY Left 09/11/2017   Procedure: LEFT TOTAL KNEE ARTHROPLASTY;  Surgeon: Gaynelle Arabian, MD;  Location: [facility];  Service: Orthopedics;  Laterality: Left;  with block   US ECHOCARDIOGRAPHY  07/17/2006   EF 55-60%    There were no vitals filed for this visit.   Subjective Assessment - 06/03/21 1105     Subjective Patient reports the needling helped. She is still having pain in the right hip and the knee. She reported the needling made the hip better for several hours.    Pertinent History Patient was diagnosed on 03/08/18 with left grade 1-2 invasive ductal carcinoma breast cancer.It measured 1.5 cm and is located in the upper outer quadrant. It is ER/PR positive and HER2 negative with a Ki67 of < 1%.  She had a left  total knee replacement in 10/18 with Dr. Wynelle Link and continues to have pain in her left lateral knee. Her orthopedist reported it is likely due to her iliotibial band. Patient reports she underwent a left lumpectomy and SNLB (0/4 nodes positive) removed on 04/17/18.She completed radiation, but no chemo    Limitations Standing;Walking    How long can you stand comfortably? limited time before she has pain    How long can you walk comfortably? depends on the day    Diagnostic tests MRI of lumbar spine: L4 disc buldge with possible L5 nerve root impingement    Patient Stated Goals to get rid of the left armpit and arm pain    Currently in Pain? Yes    Pain Score 8     Pain Location Knee    Pain Orientation Left    Pain Descriptors / Indicators Aching    Pain Type Chronic pain    Pain Onset More than a month ago    Pain Frequency Constant    Aggravating Factors  standing and walking    Pain Relieving Factors nothing    Effect of Pain on Daily Activities difficulty perfroming ADL's               Pt seen for aquatic therapy today.  Treatment took place in water 3.25-4 ft in depth at the [facility] pool. Temp of water was 91.  Pt entered/exited the pool via stairs (step through pattern) independently with bilat rail.     Warm up: heel/toe walking x4 laps across pool chest deep side stepping x4 laps from shallow to deep;   Exercises; Slow march x20; hip 3 way x20; squats x20; hip extension x20; hip abduction x20; Sit to stand x20;  board trunk flexion x10 lateral board rotation x10 each way seated   With neck noddle and foot float; push pull x20; side to side perturbations from the feet x20; press and push x20  Steps step up x20 each leg; lateral step up x20 each leg;   Noodle push and pull x20 with core breathing; noodle press x20 with core breathing                   Trigger Point Dry Needling - 06/03/21 0001     Consent Given? Yes    Education Handout  Provided Yes    Muscles Treated Back/Hip Gluteus medius;Piriformis    Dry Needling Comments 3 spots needled in the glutreal without agressive pistoning. good twtich noted    Gluteus Medius Response Twitch response elicited;Palpable increased muscle length    Piriformis Response Twitch response elicited;Palpable increased muscle length  PT Education - 06/03/21 2117     Education Details reviewed self stretching in the pool and exercises to perfrom over the next week    Person(s) Educated Patient    Methods Explanation;Demonstration;Tactile cues;Verbal cues    Comprehension Verbalized understanding;Returned demonstration;Verbal cues required;Tactile cues required              PT Short Term Goals - 05/07/21 1211       PT SHORT TERM GOAL #1   Title Pt will report that she has had a reduction of discomfort in her left axilla and lateral chest to 5/10    Baseline 6/10, 04/02/2021 5/10, 05/07/2021    Time 2    Period Weeks    Status Achieved      PT SHORT TERM GOAL #2   Title Pt will report she is doing a beginning home exericse program for postural and strength exercises    Status Achieved               PT Long Term Goals - 05/07/21 1111       PT LONG TERM GOAL #1   Title Pt will have painfree left shoulder abduction of 145 degrees so that she can reach into a kitchen cabinet at home    Baseline 134 degrees with 6/10 pain on 02/17/2021, 160 degrees with 2/10 pain    Status Partially Met   much improved     PT LONG TERM GOAL #2   Title Pt will be independent in progressive resistive home exercise program for strength and fitness    Baseline ongoing as pt is participating  in aquatic therapy    Status On-going      PT LONG TERM GOAL #3   Title Pt will report that her overall pain and discomfort is decreased by 50%    Status Achieved                   Plan - 06/03/21 1106     Clinical Impression Statement Therapy needled the  prifiromis and gluteal today. She had a great twtich response with both area. She tolerated exercises well. She worked on hip stretnthening and light back and hip stretching in the pool.  Therapy will continue to progress as tolerated.    Comorbidities Previous Lt TKR with persistent pain, Previous Lt breast lumpectomy and radiation, right sciatic pain    Examination-Activity Limitations Carry;Lift    Stability/Clinical Decision Making Evolving/Moderate complexity    Clinical Decision Making Moderate    Rehab Potential Good    Clinical Impairments Affecting Rehab Potential pain in left leg, pain in right leg, decreased tolerance of gait due to pain,    PT Frequency 1x / week    PT Duration 4 weeks    PT Treatment/Interventions ADLs/Self Care Home Management;Therapeutic activities;Therapeutic exercise;Patient/family education;Manual techniques;Manual lymph drainage;Scar mobilization;Passive range of motion;Moist Heat;Balance training;Functional mobility training;Orthotic Fit/Training;Taping;Compression bandaging;Aquatic Therapy;Cryotherapy;Neuromuscular re-education;Dry needling    PT Next Visit Plan continue with aquatic therapy buyt also if spasming continues consider needling with pulsed    PT Home Exercise Plan quad set, SLR,  prifriformis stretch; hamstring stretch;    Consulted and Agree with Plan of Care Patient             Patient will benefit from skilled therapeutic intervention in order to improve the following deficits and impairments:  Decreased knowledge of precautions, Impaired UE functional use, Decreased range of motion, Postural dysfunction, Decreased scar mobility, Decreased mobility, Pain, Increased fascial restricitons, Increased  edema, Abnormal gait, Difficulty walking, Decreased endurance, Decreased activity tolerance, Impaired perceived functional ability, Decreased knowledge of use of DME, Decreased strength  Visit Diagnosis: Other abnormalities of gait and  mobility  Pain in right hip  Chronic pain of left knee     Problem List Patient Active Problem List   Diagnosis Date Noted   Breast mass, right 03/27/2020   Hepatic steatosis 01/22/2020   Genetic testing 04/02/2018   Family history of breast cancer    Malignant neoplasm of upper-outer quadrant of left breast in female, estrogen receptor positive ([place]) 03/16/2018   OA (osteoarthritis) of knee 09/11/2017   Infrapatellar bursitis of right knee 04/29/2014   Pneumonia, aspiration ([place]) 12/28/2012   HYPOTHYROIDISM 11/15/2010   HYPERLIPIDEMIA 11/15/2010   DEPRESSION 11/15/2010   GERD 11/15/2010   CONSTIPATION 11/15/2010   GALLSTONES 11/15/2010   RENAL CYST 11/15/2010   INSOMNIA UNSPECIFIED 11/15/2010   COUGH 11/15/2010   ABDOMINAL PAIN OTHER SPECIFIED SITE 11/15/2010   TRANSAMINASES, SERUM, ELEVATED 11/15/2010    Carney Living PT DPT  06/03/2021, 9:26 PM  [facility] [address] [place], [place], [address] Phone: [phone]   Fax:  [phone]  Name: [name] MRN: [id number] Date of Birth: [birth year]

## 2021-06-03 NOTE — Telephone Encounter (Signed)
This RN contacted

## 2021-06-04 ENCOUNTER — Ambulatory Visit (HOSPITAL_BASED_OUTPATIENT_CLINIC_OR_DEPARTMENT_OTHER): Payer: Medicare Other | Admitting: Physical Therapy

## 2021-06-09 ENCOUNTER — Telehealth: Payer: Self-pay | Admitting: Oncology

## 2021-06-09 NOTE — Telephone Encounter (Signed)
Sch per 6/22 los, pt aware

## 2021-06-17 ENCOUNTER — Ambulatory Visit (HOSPITAL_BASED_OUTPATIENT_CLINIC_OR_DEPARTMENT_OTHER): Payer: Medicare Other | Attending: Internal Medicine | Admitting: Physical Therapy

## 2021-06-17 ENCOUNTER — Other Ambulatory Visit: Payer: Self-pay

## 2021-06-17 DIAGNOSIS — M25562 Pain in left knee: Secondary | ICD-10-CM | POA: Insufficient documentation

## 2021-06-17 DIAGNOSIS — R2689 Other abnormalities of gait and mobility: Secondary | ICD-10-CM | POA: Diagnosis not present

## 2021-06-17 DIAGNOSIS — M25551 Pain in right hip: Secondary | ICD-10-CM | POA: Diagnosis not present

## 2021-06-17 DIAGNOSIS — G8929 Other chronic pain: Secondary | ICD-10-CM | POA: Diagnosis not present

## 2021-06-17 DIAGNOSIS — M25512 Pain in left shoulder: Secondary | ICD-10-CM | POA: Insufficient documentation

## 2021-06-18 NOTE — Therapy (Signed)
[name] [address] [place], [place], [address] Phone: [phone]   Fax:  [phone]  Physical Therapy Treatment/Progress  Patient Details  Name: [name] MRN: [id number] Date of Birth: 1946/03/14 Referring Provider (PT): Dr Shon Baton  Progress Note Reporting Period 03/17/2021 to 06/17/2021  See note below for Objective Data and Assessment of Progress/Goals.      Encounter Date: 06/17/2021   PT End of Session - 06/18/21 1523     Visit Number 10    Number of Visits 12    Date for PT Re-Evaluation 06/12/21    PT Start Time 1100    PT Stop Time 1142    PT Time Calculation (min) 42 min    Activity Tolerance Patient tolerated treatment well    Behavior During Therapy WFL for tasks assessed/performed             Past Medical History:  Diagnosis Date   Arthritis    hands and knees   Complication of anesthesia    aspiration pna; following a colonoscopy, pt requests head of bed elevated if possible.   Constipation    Cough    Depression    Dysrhythmia    RBBB on 06-06-17 ekg    Elevated liver function tests    Esophageal stricture    Family history of breast cancer    Gallstones    Genetic testing 04/02/2018   STAT Breast panel with reflex to Multi-Cancer panel (83 genes) @ [facility] - No pathogenic mutations detected   GERD (gastroesophageal reflux disease)    Hiatal hernia    History of kidney stones    History of radiation therapy 07/09/2018- 08/03/18   Left Breast/ 40.05 Gy in 15 fractions. Left Breast boost 10 Gy in 5 fractions.    Hyperlipemia    Hypothyroidism    Insomnia    Malignant neoplasm of upper-outer quadrant of left female breast ([place])    Nephrolithiasis    Pneumonia 2015   aspirated after colonosopy   Renal cyst     Past Surgical History:  Procedure Laterality Date   BREAST BIOPSY Left 05/31/2019   mri   BREAST BIOPSY Left 04/2019   malignant   BREAST LUMPECTOMY Left  04/17/2018   malignant   BREAST LUMPECTOMY WITH RADIOACTIVE SEED AND SENTINEL LYMPH NODE BIOPSY Left 04/17/2018   Procedure: LEFT BREAST LUMPECTOMY WITH BRACKETED RADIOACTIVE SEEDS AND SENTINEL LYMPH NODE BIOPSY;  Surgeon: Rolm Bookbinder, MD;  Location: [facility];  Service: General;  Laterality: Left;   BREAST SURGERY Left    Lumpectomy   CARDIOVASCULAR STRESS TEST  07/19/2006   EF 70%, NO EVIDENCE OF ISCHEMIA   CHOLECYSTECTOMY  12/30/10   COLONOSCOPY     DILATION AND CURETTAGE OF UTERUS     HYSTEROSCOPY WITH D & C N/A 06/07/2018   Procedure: DILATATION AND CURETTAGE /HYSTEROSCOPY;  Surgeon: [name], MD;  Location: [facility];  Service: Gynecology;  Laterality: N/A;   inguinal herniography     NASAL SINUS SURGERY     RADIOACTIVE SEED GUIDED EXCISIONAL BREAST BIOPSY Right 04/17/2018   Procedure: RIGHT BREAST SEED GUIDED EXCISIONAL BIOPSY;  Surgeon: Rolm Bookbinder, MD;  Location: [facility];  Service: General;  Laterality: Right;   RE-EXCISION OF BREAST LUMPECTOMY Left 05/15/2018   Procedure: RE-EXCISION OF LEFT BREAST LUMPECTOMY, ASPIRATION OF LEFT AXILLARY SEROMA;  Surgeon: Rolm Bookbinder, MD;  Location: [place];  Service: General;  Laterality: Left;   T &  A     TOOTH EXTRACTION     TOTAL KNEE ARTHROPLASTY Left 09/11/2017   Procedure: LEFT TOTAL KNEE ARTHROPLASTY;  Surgeon: Gaynelle Arabian, MD;  Location: [facility];  Service: Orthopedics;  Laterality: Left;  with block   US ECHOCARDIOGRAPHY  07/17/2006   EF 55-60%    There were no vitals filed for this visit.       [facility] PT Assessment - 06/18/21 0001       Strength   Right Hip Flexion 4+/5    Right Hip ABduction 4+/5    Right Hip ADduction 4+/5    Left Hip Flexion 4/5    Left Hip ABduction 4/5    Left Hip ADduction 4+/5    Left Knee Flexion 4+/5    Left Knee Extension 4/5      Palpation   Palpation comment continues to have spasming in the right gluteal              ambualtion: continues to have lateral movement away from the left LE. In the pool she is able to correct.          Pt seen for aquatic therapy today.  Treatment took place in water 3.25-4 ft in depth at the [facility] pool. Temp of water was 91.  Pt entered/exited the pool via stairs (step through pattern) independently with bilat rail.  Introduction to water. Had patient stand at different levels so she could feel the bouncy   Warm up: heel/toe walking x4 laps across pool chest deep side stepping x4 laps from shallow to deep; Long steps for stretch 3 laps; wide side step for stretch 3 laps;   Exercises; Slow march x20;  s hip extension x20; hip abduction x20; Sit to stand x20;  board trunk flexion x10 lateral board rotation x10 each way seated   With neck noddle and foot float; push pull x20; side to side perturbations from the feet x20; press and push x20  Steps step up x20 each leg; lateral step up x20 each leg;   Noodle push and pull x20 with core breathing; noodle press x20 with core breathing  Hamstring stretch 3x20 sec hold  Piriformis stretch 3x20 sec hold   Seated: LAQ x20 2lbs  March 2x20 sec hold   Manual: forward stretch with trigger point release      Pt requires buoyancy for support and to offload joints with strengthening exercises. Viscosity of the water is needed for resistance of strengthening; water current perturbations provides challenge to standing balance unsupported, requiring increased core activation.               PT Short Term Goals - 05/07/21 1211       PT SHORT TERM GOAL #1   Title Pt will report that she has had a reduction of discomfort in her left axilla and lateral chest to 5/10    Baseline 6/10, 04/02/2021 5/10, 05/07/2021    Time 2    Period Weeks    Status Achieved      PT SHORT TERM GOAL #2   Title Pt will report she is doing a beginning home exericse program for postural and strength exercises    Status  Achieved               PT Long Term Goals - 05/07/21 1111       PT LONG TERM GOAL #1   Title Pt will have painfree left shoulder abduction of 145 degrees so that she can  reach into a kitchen cabinet at home    Baseline 134 degrees with 6/10 pain on 02/17/2021, 160 degrees with 2/10 pain    Status Partially Met   much improved     PT LONG TERM GOAL #2   Title Pt will be independent in progressive resistive home exercise program for strength and fitness    Baseline ongoing as pt is participating  in aquatic therapy    Status On-going      PT LONG TERM GOAL #3   Title Pt will report that her overall pain and discomfort is decreased by 50%    Status Achieved                   Plan - 06/18/21 1529     Clinical Impression Statement The patient is making progress with pool therapy. She has been able to progress her intensity of strengthening in the pool without a significant increase in pain. Overall her function is improving. She is walking in the community for exercise when the weather allows. She is also working out in her pool 2-3x a week. She continues to have significant pain in her left knee. It swells with activity but the pain level has reamiened at a consitant level, even with her change in activity. Overall her strength has improved. She would benefit from further skilled therapty 1W6. We will continue to progress as tolerated. She was advised at some point she may want to follow up with her knee surgeon. She is 3 years post-op and continues to have significant swelling and pain.    Personal Factors and Comorbidities Comorbidity 3+    Comorbidities Previous Lt TKR with persistent pain, Previous Lt breast lumpectomy and radiation, right sciatic pain    Examination-Activity Limitations Carry;Lift    Stability/Clinical Decision Making Evolving/Moderate complexity    Clinical Decision Making Moderate    Rehab Potential Good    PT Frequency 1x / week    PT Duration 6 weeks     PT Treatment/Interventions ADLs/Self Care Home Management;Therapeutic activities;Therapeutic exercise;Patient/family education;Manual techniques;Manual lymph drainage;Scar mobilization;Passive range of motion;Moist Heat;Balance training;Functional mobility training;Orthotic Fit/Training;Taping;Compression bandaging;Aquatic Therapy;Cryotherapy;Neuromuscular re-education;Dry needling    PT Next Visit Plan continue with aquatic therapy buyt also if spasming continues consider needling with pulsed    PT Home Exercise Plan quad set, SLR,  prifriformis stretch; hamstring stretch;    Consulted and Agree with Plan of Care Patient             Patient will benefit from skilled therapeutic intervention in order to improve the following deficits and impairments:  Decreased knowledge of precautions, Impaired UE functional use, Decreased range of motion, Postural dysfunction, Decreased scar mobility, Decreased mobility, Pain, Increased fascial restricitons, Increased edema, Abnormal gait, Difficulty walking, Decreased endurance, Decreased activity tolerance, Impaired perceived functional ability, Decreased knowledge of use of DME, Decreased strength  Visit Diagnosis: Other abnormalities of gait and mobility  Pain in right hip  Chronic pain of left knee     Problem List Patient Active Problem List   Diagnosis Date Noted   Breast mass, right 03/27/2020   Hepatic steatosis 01/22/2020   Genetic testing 04/02/2018   Family history of breast cancer    Malignant neoplasm of upper-outer quadrant of left breast in female, estrogen receptor positive ([place]) 03/16/2018   OA (osteoarthritis) of knee 09/11/2017   Infrapatellar bursitis of right knee 04/29/2014   Pneumonia, aspiration ([place]) 12/28/2012   HYPOTHYROIDISM 11/15/2010   HYPERLIPIDEMIA 11/15/2010   DEPRESSION 11/15/2010  GERD 11/15/2010   CONSTIPATION 11/15/2010   GALLSTONES 11/15/2010   RENAL CYST 11/15/2010   INSOMNIA UNSPECIFIED  11/15/2010   COUGH 11/15/2010   ABDOMINAL PAIN OTHER SPECIFIED SITE 11/15/2010   TRANSAMINASES, SERUM, ELEVATED 11/15/2010    Carney Living PT DPT  06/18/2021, 3:35 PM  [place]  06/18/2021   During this treatment session, the therapist was present, participating in and directing the treatment.    [facility] [address] [place], [place], [address] Phone: [phone]   Fax:  [phone]  Name: [name] MRN: [id number] Date of Birth: February 09, 1946

## 2021-06-21 DIAGNOSIS — Z20822 Contact with and (suspected) exposure to covid-19: Secondary | ICD-10-CM | POA: Diagnosis not present

## 2021-06-24 ENCOUNTER — Encounter (HOSPITAL_BASED_OUTPATIENT_CLINIC_OR_DEPARTMENT_OTHER): Payer: Self-pay | Admitting: Physical Therapy

## 2021-06-24 ENCOUNTER — Ambulatory Visit (HOSPITAL_BASED_OUTPATIENT_CLINIC_OR_DEPARTMENT_OTHER): Payer: Medicare Other | Admitting: Physical Therapy

## 2021-06-24 ENCOUNTER — Other Ambulatory Visit: Payer: Self-pay

## 2021-06-24 DIAGNOSIS — M25562 Pain in left knee: Secondary | ICD-10-CM

## 2021-06-24 DIAGNOSIS — M25512 Pain in left shoulder: Secondary | ICD-10-CM | POA: Diagnosis not present

## 2021-06-24 DIAGNOSIS — G8929 Other chronic pain: Secondary | ICD-10-CM

## 2021-06-24 DIAGNOSIS — R2689 Other abnormalities of gait and mobility: Secondary | ICD-10-CM

## 2021-06-24 DIAGNOSIS — M25551 Pain in right hip: Secondary | ICD-10-CM

## 2021-06-25 NOTE — Therapy (Signed)
[facility] [address] [place], [place], [address] Phone: [phone]   Fax:  [phone]  Physical Therapy Treatment  Patient Details  Name: [name] MRN: [id number] Date of Birth: 07-23-1946 Referring Provider (PT): Dr Creola Corn   Encounter Date: 06/24/2021   PT End of Session - 06/25/21 1336     Visit Number 11    Number of Visits 16    Date for PT Re-Evaluation 07/30/21    Authorization Type begin KX 2nd to 15 visit limit reached    PT Start Time 1145    PT Stop Time 1234    PT Time Calculation (min) 49 min    Activity Tolerance Patient tolerated treatment well    Behavior During Therapy [facility] for tasks assessed/performed             Past Medical History:  Diagnosis Date   Arthritis    hands and knees   Complication of anesthesia    aspiration pna; following a colonoscopy, pt requests head of bed elevated if possible.   Constipation    Cough    Depression    Dysrhythmia    RBBB on 06-06-17 ekg    Elevated liver function tests    Esophageal stricture    Family history of breast cancer    Gallstones    Genetic testing 04/02/2018   STAT Breast panel with reflex to Multi-Cancer panel (83 genes) @ [facility] - No pathogenic mutations detected   GERD (gastroesophageal reflux disease)    Hiatal hernia    History of kidney stones    History of radiation therapy 07/09/2018- 08/03/18   Left Breast/ 40.05 Gy in 15 fractions. Left Breast boost 10 Gy in 5 fractions.    Hyperlipemia    Hypothyroidism    Insomnia    Malignant neoplasm of upper-outer quadrant of left female breast (HCC)    Nephrolithiasis    Pneumonia 2015   aspirated after colonosopy   Renal cyst     Past Surgical History:  Procedure Laterality Date   BREAST BIOPSY Left 05/31/2019   mri   BREAST BIOPSY Left 04/2019   malignant   BREAST LUMPECTOMY Left 04/17/2018   malignant   BREAST LUMPECTOMY WITH RADIOACTIVE SEED AND SENTINEL LYMPH  NODE BIOPSY Left 04/17/2018   Procedure: LEFT BREAST LUMPECTOMY WITH BRACKETED RADIOACTIVE SEEDS AND SENTINEL LYMPH NODE BIOPSY;  Surgeon: Emelia Loron, MD;  Location: [facility];  Service: General;  Laterality: Left;   BREAST SURGERY Left    Lumpectomy   CARDIOVASCULAR STRESS TEST  07/19/2006   EF 70%, NO EVIDENCE OF ISCHEMIA   CHOLECYSTECTOMY  12/30/10   COLONOSCOPY     DILATION AND CURETTAGE OF UTERUS     HYSTEROSCOPY WITH D & C N/A 06/07/2018   Procedure: DILATATION AND CURETTAGE /HYSTEROSCOPY;  Surgeon: Marcelle Overlie, MD;  Location: [facility];  Service: Gynecology;  Laterality: N/A;   inguinal herniography     NASAL SINUS SURGERY     RADIOACTIVE SEED GUIDED EXCISIONAL BREAST BIOPSY Right 04/17/2018   Procedure: RIGHT BREAST SEED GUIDED EXCISIONAL BIOPSY;  Surgeon: Emelia Loron, MD;  Location: [facility];  Service: General;  Laterality: Right;   RE-EXCISION OF BREAST LUMPECTOMY Left 05/15/2018   Procedure: RE-EXCISION OF LEFT BREAST LUMPECTOMY, ASPIRATION OF LEFT AXILLARY SEROMA;  Surgeon: Emelia Loron, MD;  Location: [facility];  Service: General;  Laterality: Left;   T & A     TOOTH EXTRACTION  TOTAL KNEE ARTHROPLASTY Left 09/11/2017   Procedure: LEFT TOTAL KNEE ARTHROPLASTY;  Surgeon: Gaynelle Arabian, MD;  Location: [facility];  Service: Orthopedics;  Laterality: Left;  with block   US ECHOCARDIOGRAPHY  07/17/2006   EF 55-60%    There were no vitals filed for this visit.   Subjective Assessment - 06/24/21 1210     Subjective Patient continues to have significant sciatic pain down into her calf. She has been working onsitently on her exercises.    Pertinent History Patient was diagnosed on 03/08/18 with left grade 1-2 invasive ductal carcinoma breast cancer.It measured 1.5 cm and is located in the upper outer quadrant. It is ER/PR positive and HER2 negative with a Ki67 of < 1%.  She had a left total knee replacement in 10/18 with Dr.  Wynelle Link and continues to have pain in her left lateral knee. Her orthopedist reported it is likely due to her iliotibial band. Patient reports she underwent a left lumpectomy and SNLB (0/4 nodes positive) removed on 04/17/18.She completed radiation, but no chemo    Limitations Standing;Walking    How long can you walk comfortably? depends on the day    Diagnostic tests MRI of lumbar spine: L4 disc buldge with possible L5 nerve root impingement    Currently in Pain? Yes    Pain Score 8     Pain Location Knee    Pain Orientation Left    Pain Descriptors / Indicators Aching    Pain Type Chronic pain    Pain Onset More than a month ago    Pain Frequency Constant    Aggravating Factors  standing and walking    Pain Relieving Factors nothing    Effect of Pain on Daily Activities difficult yperfromign ADL's                               [facility] Adult PT Treatment/Exercise - 06/25/21 0001       Self-Care   Other Self-Care Comments  reviewed function of thie piriformis; reviewed inflamation vs compression; reviewed dermatomes and pain into the calf and postential causes      Manual Therapy   Manual therapy comments skilled palaption of trigger points    Myofascial Release trigger point release to right lateral hip using manual pressure and a roller    Manual Traction LAD grade II and III ocicaltions to bilateral hips              Trigger Point Dry Needling - 06/25/21 0001     Consent Given? Yes    Education Handout Provided Yes    Electrical Stimulation Performed with Dry Needling Yes    E-stim with Dry Needling Details e-stim perfromed with intesity between 2 and 4 2 needles in right gluteal frequency increased from 2-4 during treatment x10 minutes.    Gluteus Medius Response Twitch response elicited;Palpable increased muscle length    Piriformis Response Twitch response elicited;Palpable increased muscle length                  PT Education - 06/25/21  1335     Education Details reviewed benefits of TPDN . reviewed needling with e-stim    Person(s) Educated Patient    Methods Explanation;Demonstration;Tactile cues;Verbal cues    Comprehension Returned demonstration;Verbal cues required;Verbalized understanding;Tactile cues required              PT Short Term Goals - 05/07/21 1211  PT SHORT TERM GOAL #1   Title Pt will report that she has had a reduction of discomfort in her left axilla and lateral chest to 5/10    Baseline 6/10, 04/02/2021 5/10, 05/07/2021    Time 2    Period Weeks    Status Achieved      PT SHORT TERM GOAL #2   Title Pt will report she is doing a beginning home exericse program for postural and strength exercises    Status Achieved               PT Long Term Goals - 05/07/21 1111       PT LONG TERM GOAL #1   Title Pt will have painfree left shoulder abduction of 145 degrees so that she can reach into a kitchen cabinet at home    Baseline 134 degrees with 6/10 pain on 02/17/2021, 160 degrees with 2/10 pain    Status Partially Met   much improved     PT LONG TERM GOAL #2   Title Pt will be independent in progressive resistive home exercise program for strength and fitness    Baseline ongoing as pt is participating  in aquatic therapy    Status On-going      PT LONG TERM GOAL #3   Title Pt will report that her overall pain and discomfort is decreased by 50%    Status Achieved                   Plan - 06/25/21 1339     Clinical Impression Statement Therapy trialed needling of the patients piriformins and needling of the gluteal using e-stim. She reported no problems with either. Her piriformis was very tight and difficult to needle. Therapy perfrom manual therapy to lateral hip and grade II and III rythmic occilations of the hip to reduce inflmation. Improved pain noted. Therapy educated patient on potential causes of her right hip pain and how strengthening should over time improve  these deficits.    Personal Factors and Comorbidities Comorbidity 3+    Comorbidities Previous Lt TKR with persistent pain, Previous Lt breast lumpectomy and radiation, right sciatic pain    Examination-Activity Limitations Carry;Lift    Stability/Clinical Decision Making Evolving/Moderate complexity    Clinical Decision Making Moderate    Rehab Potential Good    PT Frequency 1x / week    PT Duration 6 weeks    PT Treatment/Interventions ADLs/Self Care Home Management;Therapeutic activities;Therapeutic exercise;Patient/family education;Manual techniques;Manual lymph drainage;Scar mobilization;Passive range of motion;Moist Heat;Balance training;Functional mobility training;Orthotic Fit/Training;Taping;Compression bandaging;Aquatic Therapy;Cryotherapy;Neuromuscular re-education;Dry needling    PT Next Visit Plan continue with aquatic therapy buyt also if spasming continues consider needling with pulsed    PT Home Exercise Plan quad set, SLR,  prifriformis stretch; hamstring stretch;    Consulted and Agree with Plan of Care Patient             Patient will benefit from skilled therapeutic intervention in order to improve the following deficits and impairments:  Decreased knowledge of precautions, Impaired UE functional use, Decreased range of motion, Postural dysfunction, Decreased scar mobility, Decreased mobility, Pain, Increased fascial restricitons, Increased edema, Abnormal gait, Difficulty walking, Decreased endurance, Decreased activity tolerance, Impaired perceived functional ability, Decreased knowledge of use of DME, Decreased strength  Visit Diagnosis: Other abnormalities of gait and mobility  Pain in right hip  Chronic pain of left knee  Chronic left shoulder pain     Problem List Patient Active Problem List  Diagnosis Date Noted   Breast mass, right 03/27/2020   Hepatic steatosis 01/22/2020   Genetic testing 04/02/2018   Family history of breast cancer    Malignant  neoplasm of upper-outer quadrant of left breast in female, estrogen receptor positive ([place]) 03/16/2018   OA (osteoarthritis) of knee 09/11/2017   Infrapatellar bursitis of right knee 04/29/2014   Pneumonia, aspiration ([place]) 12/28/2012   HYPOTHYROIDISM 11/15/2010   HYPERLIPIDEMIA 11/15/2010   DEPRESSION 11/15/2010   GERD 11/15/2010   CONSTIPATION 11/15/2010   GALLSTONES 11/15/2010   RENAL CYST 11/15/2010   INSOMNIA UNSPECIFIED 11/15/2010   COUGH 11/15/2010   ABDOMINAL PAIN OTHER SPECIFIED SITE 11/15/2010   TRANSAMINASES, SERUM, ELEVATED 11/15/2010    Carney Living PT DPT  06/25/2021, 3:27 PM  [facility] [place], [place], [address] Phone: [phone]   Fax:  [phone]  Name: [name] MRN: [id number] Date of Birth: 1946/03/07

## 2021-06-29 DIAGNOSIS — K219 Gastro-esophageal reflux disease without esophagitis: Secondary | ICD-10-CM | POA: Diagnosis not present

## 2021-06-29 DIAGNOSIS — K449 Diaphragmatic hernia without obstruction or gangrene: Secondary | ICD-10-CM | POA: Diagnosis not present

## 2021-07-01 ENCOUNTER — Encounter (HOSPITAL_BASED_OUTPATIENT_CLINIC_OR_DEPARTMENT_OTHER): Payer: Self-pay | Admitting: Physical Therapy

## 2021-07-01 ENCOUNTER — Other Ambulatory Visit: Payer: Self-pay

## 2021-07-01 ENCOUNTER — Ambulatory Visit (HOSPITAL_BASED_OUTPATIENT_CLINIC_OR_DEPARTMENT_OTHER): Payer: Medicare Other | Admitting: Physical Therapy

## 2021-07-01 DIAGNOSIS — M25512 Pain in left shoulder: Secondary | ICD-10-CM

## 2021-07-01 DIAGNOSIS — M25551 Pain in right hip: Secondary | ICD-10-CM

## 2021-07-01 DIAGNOSIS — G8929 Other chronic pain: Secondary | ICD-10-CM | POA: Diagnosis not present

## 2021-07-01 DIAGNOSIS — R2689 Other abnormalities of gait and mobility: Secondary | ICD-10-CM

## 2021-07-01 DIAGNOSIS — M25562 Pain in left knee: Secondary | ICD-10-CM | POA: Diagnosis not present

## 2021-07-02 ENCOUNTER — Encounter (HOSPITAL_BASED_OUTPATIENT_CLINIC_OR_DEPARTMENT_OTHER): Payer: Self-pay | Admitting: Physical Therapy

## 2021-07-02 NOTE — Therapy (Signed)
[place], [place], [address] Phone: [phone]   Fax:  [phone]  Physical Therapy Treatment  Patient Details  Name: [name] MRN: [id number] Date of Birth: 23-Jul-1946 Referring Provider (PT): Dr Shon Baton   Encounter Date: 07/01/2021   PT End of Session - 07/01/21 1246     Visit Number 12    Number of Visits 16    Date for PT Re-Evaluation 07/30/21    Authorization Type begin KX 2nd to 15 visit limit reached    Authorization - Visit Number 8    Authorization - Number of Visits 12    PT Start Time 6226   Patient not checked in   PT Stop Time 1315    PT Time Calculation (min) 40 min    Activity Tolerance Patient tolerated treatment well    Behavior During Therapy [facility] for tasks assessed/performed             Past Medical History:  Diagnosis Date   Arthritis    hands and knees   Complication of anesthesia    aspiration pna; following a colonoscopy, pt requests head of bed elevated if possible.   Constipation    Cough    Depression    Dysrhythmia    RBBB on 06-06-17 ekg    Elevated liver function tests    Esophageal stricture    Family history of breast cancer    Gallstones    Genetic testing 04/02/2018   STAT Breast panel with reflex to Multi-Cancer panel (83 genes) @ [facility] - No pathogenic mutations detected   GERD (gastroesophageal reflux disease)    Hiatal hernia    History of kidney stones    History of radiation therapy 07/09/2018- 08/03/18   Left Breast/ 40.05 Gy in 15 fractions. Left Breast boost 10 Gy in 5 fractions.    Hyperlipemia    Hypothyroidism    Insomnia    Malignant neoplasm of upper-outer quadrant of left female breast ([place])    Nephrolithiasis    Pneumonia 2015   aspirated after colonosopy   Renal cyst     Past Surgical History:  Procedure Laterality Date   BREAST BIOPSY Left 05/31/2019   mri   BREAST BIOPSY Left 04/2019   malignant   BREAST  LUMPECTOMY Left 04/17/2018   malignant   BREAST LUMPECTOMY WITH RADIOACTIVE SEED AND SENTINEL LYMPH NODE BIOPSY Left 04/17/2018   Procedure: LEFT BREAST LUMPECTOMY WITH BRACKETED RADIOACTIVE SEEDS AND SENTINEL LYMPH NODE BIOPSY;  Surgeon: Rolm Bookbinder, MD;  Location: [facility];  Service: General;  Laterality: Left;   BREAST SURGERY Left    Lumpectomy   CARDIOVASCULAR STRESS TEST  07/19/2006   EF 70%, NO EVIDENCE OF ISCHEMIA   CHOLECYSTECTOMY  12/30/10   COLONOSCOPY     DILATION AND CURETTAGE OF UTERUS     HYSTEROSCOPY WITH D & C N/A 06/07/2018   Procedure: DILATATION AND CURETTAGE /HYSTEROSCOPY;  Surgeon: [name], MD;  Location: [facility];  Service: Gynecology;  Laterality: N/A;   inguinal herniography     NASAL SINUS SURGERY     RADIOACTIVE SEED GUIDED EXCISIONAL BREAST BIOPSY Right 04/17/2018   Procedure: RIGHT BREAST SEED GUIDED EXCISIONAL BIOPSY;  Surgeon: Rolm Bookbinder, MD;  Location: [facility];  Service: General;  Laterality: Right;   RE-EXCISION OF BREAST LUMPECTOMY Left 05/15/2018   Procedure: RE-EXCISION OF LEFT BREAST LUMPECTOMY, ASPIRATION OF LEFT AXILLARY SEROMA;  Surgeon: Rolm Bookbinder, MD;  Location: [facility];  Service: General;  Laterality: Left;   T & A     TOOTH EXTRACTION     TOTAL KNEE ARTHROPLASTY Left 09/11/2017   Procedure: LEFT TOTAL KNEE ARTHROPLASTY;  Surgeon: Gaynelle Arabian, MD;  Location: [facility];  Service: Orthopedics;  Laterality: Left;  with block   US ECHOCARDIOGRAPHY  07/17/2006   EF 55-60%    There were no vitals filed for this visit.   Subjective Assessment - 07/01/21 1242     Subjective Patient continues to have sciatic pain down her leg onto her lateral knee/calf. She feels frustated since she has been doing her exercises consistenly but pain has not improved.    Pertinent History Patient was diagnosed on 03/08/18 with left grade 1-2 invasive ductal carcinoma breast cancer.It measured 1.5 cm  and is located in the upper outer quadrant. It is ER/PR positive and HER2 negative with a Ki67 of < 1%.  She had a left total knee replacement in 10/18 with Dr. Wynelle Link and continues to have pain in her left lateral knee. Her orthopedist reported it is likely due to her iliotibial band. Patient reports she underwent a left lumpectomy and SNLB (0/4 nodes positive) removed on 04/17/18.She completed radiation, but no chemo    Limitations Standing;Walking    How long can you stand comfortably? limited time before she has pain    How long can you walk comfortably? depends on the day    Diagnostic tests MRI of lumbar spine: L4 disc buldge with possible L5 nerve root impingement    Patient Stated Goals to get rid of the left armpit and arm pain    Currently in Pain? Yes    Pain Score 8     Pain Location Knee    Pain Orientation Left    Pain Descriptors / Indicators Aching    Pain Type Chronic pain    Pain Radiating Towards into right buttock    Pain Onset More than a month ago    Pain Frequency Constant    Aggravating Factors  standing and walking    Pain Relieving Factors nothing    Effect of Pain on Daily Activities difficulty performing ADL's                    bridging 2x15;  SLR 2x10;  Clamshell 2x20 red ( given for home)            [facility] Adult PT Treatment/Exercise - 07/02/21 0001       Manual Therapy   Manual Therapy Soft tissue mobilization    Manual therapy comments skilled palaption of trigger points    Soft tissue mobilization trigger point release to pieirformis and gluteal    Manual Traction LAD grade II and III ocicaltions to bilateral hips              Trigger Point Dry Needling - 07/02/21 0001     Consent Given? Yes    Education Handout Provided Yes    Electrical Stimulation Performed with Dry Needling Yes    E-stim with Dry Needling Details e-stim perfromed with intesity between 2 and 4 2 needles in right gluteal frequency increased from 2-4  during treatment x10 minutes.    Other Dry Needling 2 spots needled in the pirifromis today    Gluteus Medius Response Twitch response elicited;Palpable increased muscle length    Piriformis Response Twitch response elicited;Palpable increased muscle length  PT Education - 07/01/21 1245     Education Details Reviewed HEP    Person(s) Educated Patient    Methods Explanation;Demonstration;Tactile cues    Comprehension Verbalized understanding;Returned demonstration;Tactile cues required;Verbal cues required              PT Short Term Goals - 05/07/21 1211       PT SHORT TERM GOAL #1   Title Pt will report that she has had a reduction of discomfort in her left axilla and lateral chest to 5/10    Baseline 6/10, 04/02/2021 5/10, 05/07/2021    Time 2    Period Weeks    Status Achieved      PT SHORT TERM GOAL #2   Title Pt will report she is doing a beginning home exericse program for postural and strength exercises    Status Achieved               PT Long Term Goals - 05/07/21 1111       PT LONG TERM GOAL #1   Title Pt will have painfree left shoulder abduction of 145 degrees so that she can reach into a kitchen cabinet at home    Baseline 134 degrees with 6/10 pain on 02/17/2021, 160 degrees with 2/10 pain    Status Partially Met   much improved     PT LONG TERM GOAL #2   Title Pt will be independent in progressive resistive home exercise program for strength and fitness    Baseline ongoing as pt is participating  in aquatic therapy    Status On-going      PT LONG TERM GOAL #3   Title Pt will report that her overall pain and discomfort is decreased by 50%    Status Achieved                   Plan - 07/01/21 1247     Clinical Impression Statement Therapy needled the patients pirifromis today and gluteal. There was less resitance to the needling. She continues to have swelling in her knee when she exercises, but she is doing it every  day. Therapy reviewed base exercises with the patient. She tolerated well. She perfroms most of these exercises on a regular basis. Although her pain level and her knee have not improved, She is still doing more overall activity.    Personal Factors and Comorbidities Comorbidity 3+    Comorbidities Previous Lt TKR with persistent pain, Previous Lt breast lumpectomy and radiation, right sciatic pain    Examination-Activity Limitations Carry;Lift    Stability/Clinical Decision Making Evolving/Moderate complexity    Clinical Decision Making Moderate    Rehab Potential Good    Clinical Impairments Affecting Rehab Potential pain in left leg, pain in right leg, decreased tolerance of gait due to pain,    PT Frequency 1x / week    PT Duration 6 weeks    PT Treatment/Interventions ADLs/Self Care Home Management;Therapeutic activities;Therapeutic exercise;Patient/family education;Manual techniques;Manual lymph drainage;Scar mobilization;Passive range of motion;Moist Heat;Balance training;Functional mobility training;Orthotic Fit/Training;Taping;Compression bandaging;Aquatic Therapy;Cryotherapy;Neuromuscular re-education;Dry needling    PT Next Visit Plan continue with aquatic therapy buyt also if spasming continues consider needling with pulsed. continue with HEP and progress as tolerate.    PT Home Exercise Plan quad set, SLR,  prifriformis stretch; hamstring stretch;    Consulted and Agree with Plan of Care Patient             Patient will benefit from skilled therapeutic intervention in order to  improve the following deficits and impairments:  Decreased knowledge of precautions, Impaired UE functional use, Decreased range of motion, Postural dysfunction, Decreased scar mobility, Decreased mobility, Pain, Increased fascial restricitons, Increased edema, Abnormal gait, Difficulty walking, Decreased endurance, Decreased activity tolerance, Impaired perceived functional ability, Decreased knowledge of use  of DME, Decreased strength  Visit Diagnosis: Other abnormalities of gait and mobility  Pain in right hip  Chronic pain of left knee  Chronic left shoulder pain     Problem List Patient Active Problem List   Diagnosis Date Noted   Breast mass, right 03/27/2020   Hepatic steatosis 01/22/2020   Genetic testing 04/02/2018   Family history of breast cancer    Malignant neoplasm of upper-outer quadrant of left breast in female, estrogen receptor positive ([place]) 03/16/2018   OA (osteoarthritis) of knee 09/11/2017   Infrapatellar bursitis of right knee 04/29/2014   Pneumonia, aspiration ([place]) 12/28/2012   HYPOTHYROIDISM 11/15/2010   HYPERLIPIDEMIA 11/15/2010   DEPRESSION 11/15/2010   GERD 11/15/2010   CONSTIPATION 11/15/2010   GALLSTONES 11/15/2010   RENAL CYST 11/15/2010   INSOMNIA UNSPECIFIED 11/15/2010   COUGH 11/15/2010   ABDOMINAL PAIN OTHER SPECIFIED SITE 11/15/2010   TRANSAMINASES, SERUM, ELEVATED 11/15/2010    Carney Living PT DPT  07/02/2021, 9:28 AM  [facility] Gourd  07/02/2021   During this treatment session, the therapist was present, participating in and directing the treatment.   [place] [address] [place], [place], [address] Phone: [phone]   Fax:  [phone]  Name: [name] MRN: [id number] Date of Birth: [birth year]

## 2021-07-07 ENCOUNTER — Ambulatory Visit (HOSPITAL_BASED_OUTPATIENT_CLINIC_OR_DEPARTMENT_OTHER): Payer: Medicare Other | Admitting: Physical Therapy

## 2021-07-07 ENCOUNTER — Other Ambulatory Visit: Payer: Self-pay

## 2021-07-07 ENCOUNTER — Encounter (HOSPITAL_BASED_OUTPATIENT_CLINIC_OR_DEPARTMENT_OTHER): Payer: Self-pay | Admitting: Physical Therapy

## 2021-07-07 DIAGNOSIS — M25551 Pain in right hip: Secondary | ICD-10-CM | POA: Diagnosis not present

## 2021-07-07 DIAGNOSIS — M25562 Pain in left knee: Secondary | ICD-10-CM | POA: Diagnosis not present

## 2021-07-07 DIAGNOSIS — R2689 Other abnormalities of gait and mobility: Secondary | ICD-10-CM

## 2021-07-07 DIAGNOSIS — G8929 Other chronic pain: Secondary | ICD-10-CM | POA: Diagnosis not present

## 2021-07-07 DIAGNOSIS — M25512 Pain in left shoulder: Secondary | ICD-10-CM | POA: Diagnosis not present

## 2021-07-07 NOTE — Therapy (Signed)
[place], [place], [address] Phone: [phone]   Fax:  [phone]  Physical Therapy Treatment  Patient Details  Name: [name] MRN: [id number] Date of Birth: 03/07/1946 Referring Provider (PT): Dr Shon Baton   Encounter Date: 07/07/2021   PT End of Session - 07/07/21 1102     Visit Number 13    Number of Visits 16    Date for PT Re-Evaluation 07/30/21    Authorization Type begin KX 2nd to 15 visit limit reached    Authorization - Visit Number 13    Authorization - Number of Visits 16    PT Start Time 1100    PT Stop Time 1143    PT Time Calculation (min) 43 min    Activity Tolerance Patient tolerated treatment well    Behavior During Therapy [facility] for tasks assessed/performed             Past Medical History:  Diagnosis Date   Arthritis    hands and knees   Complication of anesthesia    aspiration pna; following a colonoscopy, pt requests head of bed elevated if possible.   Constipation    Cough    Depression    Dysrhythmia    RBBB on 06-06-17 ekg    Elevated liver function tests    Esophageal stricture    Family history of breast cancer    Gallstones    Genetic testing 04/02/2018   STAT Breast panel with reflex to Multi-Cancer panel (83 genes) @ [facility] - No pathogenic mutations detected   GERD (gastroesophageal reflux disease)    Hiatal hernia    History of kidney stones    History of radiation therapy 07/09/2018- 08/03/18   Left Breast/ 40.05 Gy in 15 fractions. Left Breast boost 10 Gy in 5 fractions.    Hyperlipemia    Hypothyroidism    Insomnia    Malignant neoplasm of upper-outer quadrant of left female breast ([place])    Nephrolithiasis    Pneumonia 2015   aspirated after colonosopy   Renal cyst     Past Surgical History:  Procedure Laterality Date   BREAST BIOPSY Left 05/31/2019   mri   BREAST BIOPSY Left 04/2019   malignant   BREAST LUMPECTOMY Left 04/17/2018    malignant   BREAST LUMPECTOMY WITH RADIOACTIVE SEED AND SENTINEL LYMPH NODE BIOPSY Left 04/17/2018   Procedure: LEFT BREAST LUMPECTOMY WITH BRACKETED RADIOACTIVE SEEDS AND SENTINEL LYMPH NODE BIOPSY;  Surgeon: Rolm Bookbinder, MD;  Location: [facility];  Service: General;  Laterality: Left;   BREAST SURGERY Left    Lumpectomy   CARDIOVASCULAR STRESS TEST  07/19/2006   EF 70%, NO EVIDENCE OF ISCHEMIA   CHOLECYSTECTOMY  12/30/10   COLONOSCOPY     DILATION AND CURETTAGE OF UTERUS     HYSTEROSCOPY WITH D & C N/A 06/07/2018   Procedure: DILATATION AND CURETTAGE /HYSTEROSCOPY;  Surgeon: [name], MD;  Location: [facility];  Service: Gynecology;  Laterality: N/A;   inguinal herniography     NASAL SINUS SURGERY     RADIOACTIVE SEED GUIDED EXCISIONAL BREAST BIOPSY Right 04/17/2018   Procedure: RIGHT BREAST SEED GUIDED EXCISIONAL BIOPSY;  Surgeon: Rolm Bookbinder, MD;  Location: [facility];  Service: General;  Laterality: Right;   RE-EXCISION OF BREAST LUMPECTOMY Left 05/15/2018   Procedure: RE-EXCISION OF LEFT BREAST LUMPECTOMY, ASPIRATION OF LEFT AXILLARY SEROMA;  Surgeon: Rolm Bookbinder, MD;  Location: [facility];  Service:  General;  Laterality: Left;   T & A     TOOTH EXTRACTION     TOTAL KNEE ARTHROPLASTY Left 09/11/2017   Procedure: LEFT TOTAL KNEE ARTHROPLASTY;  Surgeon: Gaynelle Arabian, MD;  Location[facility];  Service: Orthopedics;  Laterality: Left;  with block   US ECHOCARDIOGRAPHY  07/17/2006   EF 55-60%    There were no vitals filed for this visit.   Subjective Assessment - 07/07/21 1103     Subjective Patient reports after the last visit she had significant pain in the area that was needled for days. She has been working on the pool everyday. She was sore this morning. She feels like at times she is doing a little bit better but then the significant pain comes back    Pertinent History Patient was diagnosed on 03/08/18 with left grade  1-2 invasive ductal carcinoma breast cancer.It measured 1.5 cm and is located in the upper outer quadrant. It is ER/PR positive and HER2 negative with a Ki67 of < 1%.  She had a left total knee replacement in 10/18 with Dr. Wynelle Link and continues to have pain in her left lateral knee. Her orthopedist reported it is likely due to her iliotibial band. Patient reports she underwent a left lumpectomy and SNLB (0/4 nodes positive) removed on 04/17/18.She completed radiation, but no chemo    Limitations Standing;Walking    How long can you stand comfortably? limited time before she has pain    How long can you walk comfortably? depends on the day    Diagnostic tests MRI of lumbar spine: L4 disc buldge with possible L5 nerve root impingement    Patient Stated Goals to get rid of the left armpit and arm pain    Currently in Pain? Yes    Pain Score 7     Pain Location Hip    Pain Orientation Right    Pain Descriptors / Indicators Aching    Pain Type Chronic pain    Pain Onset More than a month ago    Pain Frequency Constant    Aggravating Factors  standing and walking    Pain Relieving Factors nothing    Effect of Pain on Daily Activities difficulty perfroming ADL's    Multiple Pain Sites No    Pain Onset More than a month ago    Pain Frequency Constant    Aggravating Factors  standing and walking    Pain Relieving Factors Alleve helps                               [facility] Adult PT Treatment/Exercise - 07/07/21 0001       Exercises   Other Exercises  reviewed ankle eversion with band red x20; and PF red with band      Knee/Hip Exercises: Stretches   Active Hamstring Stretch Limitations reviewed sitting hamstring stretch for home 3x20 sec hold    Piriformis Stretch Limitations reviewed for HEP    Gastroc Stretch Limitations with strap on right. could feel similar pain to the pain she has 3x20 sec hold    Other Knee/Hip Stretches piriformis stretch 3x20 sec hold; tennis ball  trigger point release to gluteal      Manual Therapy   Soft tissue mobilization trigger point release to pieirformis and gluteal and [place] area    Passive ROM gentle passive stretching of the right LE  PT Short Term Goals - 07/07/21 1222       PT SHORT TERM GOAL #4   Title Patiewnt will increase gross bilateral LE strength to 4+/5    Baseline orking on Strength    Time 3    Period Weeks    Status On-going    Target Date 07/28/21      PT SHORT TERM GOAL #5   Title Patient will be independent with basic HEP for Left LE strengthening and right hip stretching and strengthening    Time 3    Period Weeks    Status On-going    Target Date 04/08/21      PT SHORT TERM GOAL #6   Title Therapy will review FOTO score               PT Long Term Goals - 05/07/21 1111       PT LONG TERM GOAL #1   Title Pt will have painfree left shoulder abduction of 145 degrees so that she can reach into a kitchen cabinet at home    Baseline 134 degrees with 6/10 pain on 02/17/2021, 160 degrees with 2/10 pain    Status Partially Met   much improved     PT LONG TERM GOAL #2   Title Pt will be independent in progressive resistive home exercise program for strength and fitness    Baseline ongoing as pt is participating  in aquatic therapy    Status On-going      PT LONG TERM GOAL #3   Title Pt will report that her overall pain and discomfort is decreased by 50%    Status Achieved                   Plan - 07/07/21 1142     Clinical Impression Statement Patients C/O today was pain in herlateral ankle/ calve area. It was difficult to differentiate if it was lateral gastroc or peroneal pain. She was given soft tissue mobilization for the area to perfrom at ghome as well as light stretching. She is also having ischial pain today, Therapy showed her a single knee to chest stretch which she could feel. She was also given a hamstring stretch. She is doing a  hamstring stretch at home but required cuing for proper technique. With proper technique she could feel sa stretch without going into an end range. She was painful for days after needling the last time with no significant improvement afterwards. needling was held. She is doing her pool exercises daily. She was encouraged to make sure she is giing herself peoper rest days every 3-4 days of exercises. She will see the MD tomorrow reguarding her knee.    Personal Factors and Comorbidities Comorbidity 3+    Comorbidities Previous Lt TKR with persistent pain, Previous Lt breast lumpectomy and radiation, right sciatic pain    Examination-Activity Limitations Carry;Lift    Stability/Clinical Decision Making Evolving/Moderate complexity    Clinical Decision Making Moderate    Rehab Potential Good    Clinical Impairments Affecting Rehab Potential pain in left leg, pain in right leg, decreased tolerance of gait due to pain,    PT Frequency 1x / week    PT Duration 6 weeks    PT Treatment/Interventions ADLs/Self Care Home Management;Therapeutic activities;Therapeutic exercise;Patient/family education;Manual techniques;Manual lymph drainage;Scar mobilization;Passive range of motion;Moist Heat;Balance training;Functional mobility training;Orthotic Fit/Training;Taping;Compression bandaging;Aquatic Therapy;Cryotherapy;Neuromuscular re-education;Dry needling    PT Next Visit Plan continue with aquatic therapy buyt also if spasming continues consider  needling with pulsed. continue with HEP and progress as tolerate.    PT Home Exercise Plan Access Code: [email or web address]  URL: [email or web address]  Date: 07/07/2021  Prepared by: Carolyne Littles    Exercises  Supine Lower Trunk Rotation - 1 x daily - 7 x weekly - 3 sets - 10 reps  Hooklying Single Knee to Chest Stretch - 1 x daily - 7 x weekly - 1 sets - 3 reps - 30 hold  Seated Hamstring Stretch - 1 x daily - 7 x weekly - 3 sets - 3 reps - 20 hold  Supine Piriformis  Stretch with Foot on Ground - 1 x daily - 7 x weekly - 3 sets - 3 reps - 20 hold  Supine Quad Set - 1 x daily - 7 x weekly - 3 sets - 10 reps  Supine March - 1 x daily - 7 x weekly - 3 sets - 10 reps  Seated Gastroc Stretch with Strap - 1 x daily - 7 x weekly - 3 sets - 3 reps - 20sec hold  Supine Posterior Pelvic Tilt - 1 x daily - 7 x weekly - 3 sets - 10 reps  Hooklying Clamshell with Resistance - 1 x daily - 7 x weekly - 3 sets - 10 reps  Seated Hip Abduction with Resistance - 1 x daily - 7 x weekly - 3 sets - 10 reps  Seated March - 1 x daily - 7 x weekly - 3 sets - 10 reps  Ankle and Toe Plantarflexion with Resistance - 1 x daily - 7 x weekly - 3 sets - 10 reps  Ankle Eversion with Resistance - 1 x daily - 7 x weekly - 3 sets - 10 reps    Consulted and Agree with Plan of Care Patient             Patient will benefit from skilled therapeutic intervention in order to improve the following deficits and impairments:  Decreased knowledge of precautions, Impaired UE functional use, Decreased range of motion, Postural dysfunction, Decreased scar mobility, Decreased mobility, Pain, Increased fascial restricitons, Increased edema, Abnormal gait, Difficulty walking, Decreased endurance, Decreased activity tolerance, Impaired perceived functional ability, Decreased knowledge of use of DME, Decreased strength  Visit Diagnosis: Other abnormalities of gait and mobility  Pain in right hip  Chronic pain of left knee     Problem List Patient Active Problem List   Diagnosis Date Noted   Breast mass, right 03/27/2020   Hepatic steatosis 01/22/2020   Genetic testing 04/02/2018   Family history of breast cancer    Malignant neoplasm of upper-outer quadrant of left breast in female, estrogen receptor positive ([place]) 03/16/2018   OA (osteoarthritis) of knee 09/11/2017   Infrapatellar bursitis of right knee 04/29/2014   Pneumonia, aspiration ([place]) 12/28/2012   HYPOTHYROIDISM 11/15/2010   HYPERLIPIDEMIA  11/15/2010   DEPRESSION 11/15/2010   GERD 11/15/2010   CONSTIPATION 11/15/2010   GALLSTONES 11/15/2010   RENAL CYST 11/15/2010   INSOMNIA UNSPECIFIED 11/15/2010   COUGH 11/15/2010   ABDOMINAL PAIN OTHER SPECIFIED SITE 11/15/2010   TRANSAMINASES, SERUM, ELEVATED 11/15/2010    Carney Living PT DPT  07/07/2021, 12:31 PM  Davis Gourd  07/07/2021   During this treatment session, the therapist was present, participating in and directing the treatment.   [facility] [address] [place], [place], [address] Phone: [phone]   Fax:  [phone]  Name: [name] MRN: [id number] Date of Birth:  08/29/1946    

## 2021-07-08 ENCOUNTER — Ambulatory Visit (HOSPITAL_BASED_OUTPATIENT_CLINIC_OR_DEPARTMENT_OTHER): Payer: Medicare Other | Admitting: Physical Therapy

## 2021-07-08 DIAGNOSIS — Z96652 Presence of left artificial knee joint: Secondary | ICD-10-CM | POA: Diagnosis not present

## 2021-07-14 ENCOUNTER — Other Ambulatory Visit: Payer: Self-pay

## 2021-07-14 ENCOUNTER — Encounter (HOSPITAL_BASED_OUTPATIENT_CLINIC_OR_DEPARTMENT_OTHER): Payer: Self-pay | Admitting: Physical Therapy

## 2021-07-14 ENCOUNTER — Ambulatory Visit (HOSPITAL_BASED_OUTPATIENT_CLINIC_OR_DEPARTMENT_OTHER): Payer: Medicare Other | Attending: Internal Medicine | Admitting: Physical Therapy

## 2021-07-14 DIAGNOSIS — M25562 Pain in left knee: Secondary | ICD-10-CM | POA: Diagnosis not present

## 2021-07-14 DIAGNOSIS — M25551 Pain in right hip: Secondary | ICD-10-CM | POA: Diagnosis not present

## 2021-07-14 DIAGNOSIS — M6281 Muscle weakness (generalized): Secondary | ICD-10-CM | POA: Insufficient documentation

## 2021-07-14 DIAGNOSIS — G8929 Other chronic pain: Secondary | ICD-10-CM | POA: Insufficient documentation

## 2021-07-14 DIAGNOSIS — R2689 Other abnormalities of gait and mobility: Secondary | ICD-10-CM | POA: Diagnosis not present

## 2021-07-14 DIAGNOSIS — M25512 Pain in left shoulder: Secondary | ICD-10-CM | POA: Insufficient documentation

## 2021-07-14 NOTE — Therapy (Signed)
[place], [place], [address] Phone: [phone]   Fax:  [phone]  Physical Therapy Treatment  Patient Details  Name: [name] MRN: [id number] Date of Birth: [birth year] Referring Provider (PT): Dr Shon Baton   Encounter Date: 07/14/2021   PT End of Session - 07/14/21 1118     Visit Number 14    Number of Visits 16    Date for PT Re-Evaluation 07/30/21    Authorization Type begin KX 2nd to 15 visit limit reached    Authorization - Visit Number 14    Authorization - Number of Visits 16    PT Start Time 1102    PT Stop Time 1145    PT Time Calculation (min) 43 min    Activity Tolerance Patient tolerated treatment well    Behavior During Therapy [facility] for tasks assessed/performed             Past Medical History:  Diagnosis Date   Arthritis    hands and knees   Complication of anesthesia    aspiration pna; following a colonoscopy, pt requests head of bed elevated if possible.   Constipation    Cough    Depression    Dysrhythmia    RBBB on 06-06-17 ekg    Elevated liver function tests    Esophageal stricture    Family history of breast cancer    Gallstones    Genetic testing 04/02/2018   STAT Breast panel with reflex to Multi-Cancer panel (83 genes) @ [facility] - No pathogenic mutations detected   GERD (gastroesophageal reflux disease)    Hiatal hernia    History of kidney stones    History of radiation therapy 07/09/2018- 08/03/18   Left Breast/ 40.05 Gy in 15 fractions. Left Breast boost 10 Gy in 5 fractions.    Hyperlipemia    Hypothyroidism    Insomnia    Malignant neoplasm of upper-outer quadrant of left female breast ([place])    Nephrolithiasis    Pneumonia 2015   aspirated after colonosopy   Renal cyst     Past Surgical History:  Procedure Laterality Date   BREAST BIOPSY Left 05/31/2019   mri   BREAST BIOPSY Left 04/2019   malignant   BREAST LUMPECTOMY Left 04/17/2018    malignant   BREAST LUMPECTOMY WITH RADIOACTIVE SEED AND SENTINEL LYMPH NODE BIOPSY Left 04/17/2018   Procedure: LEFT BREAST LUMPECTOMY WITH BRACKETED RADIOACTIVE SEEDS AND SENTINEL LYMPH NODE BIOPSY;  Surgeon: Rolm Bookbinder, MD;  Location: [facility];  Service: General;  Laterality: Left;   BREAST SURGERY Left    Lumpectomy   CARDIOVASCULAR STRESS TEST  07/19/2006   EF 70%, NO EVIDENCE OF ISCHEMIA   CHOLECYSTECTOMY  12/30/10   COLONOSCOPY     DILATION AND CURETTAGE OF UTERUS     HYSTEROSCOPY WITH D & C N/A 06/07/2018   Procedure: DILATATION AND CURETTAGE /HYSTEROSCOPY;  Surgeon: [name], MD;  Location: [facility];  Service: Gynecology;  Laterality: N/A;   inguinal herniography     NASAL SINUS SURGERY     RADIOACTIVE SEED GUIDED EXCISIONAL BREAST BIOPSY Right 04/17/2018   Procedure: RIGHT BREAST SEED GUIDED EXCISIONAL BIOPSY;  Surgeon: Rolm Bookbinder, MD;  Location: [facility];  Service: General;  Laterality: Right;   RE-EXCISION OF BREAST LUMPECTOMY Left 05/15/2018   Procedure: RE-EXCISION OF LEFT BREAST LUMPECTOMY, ASPIRATION OF LEFT AXILLARY SEROMA;  Surgeon: Rolm Bookbinder, MD;  Location: [facility];  Service:  General;  Laterality: Left;   T & A     TOOTH EXTRACTION     TOTAL KNEE ARTHROPLASTY Left 09/11/2017   Procedure: LEFT TOTAL KNEE ARTHROPLASTY;  Surgeon: Gaynelle Arabian, MD;  [facility];  Service: Orthopedics;  Laterality: Left;  with block   US ECHOCARDIOGRAPHY  07/17/2006   EF 55-60%    There were no vitals filed for this visit.   Subjective Assessment - 07/14/21 1120     Subjective Patient reports feeling better than last week but is still in pain. She states that she wants to continue therapy as she is feeling stronger despite pain.    Pertinent History Patient was diagnosed on 03/08/18 with left grade 1-2 invasive ductal carcinoma breast cancer.It measured 1.5 cm and is located in the upper outer quadrant. It is  ER/PR positive and HER2 negative with a Ki67 of < 1%.  She had a left total knee replacement in 10/18 with Dr. Wynelle Link and continues to have pain in her left lateral knee. Her orthopedist reported it is likely due to her iliotibial band. Patient reports she underwent a left lumpectomy and SNLB (0/4 nodes positive) removed on 04/17/18.She completed radiation, but no chemo    Limitations Standing;Walking    How long can you stand comfortably? limited time before she has pain    How long can you walk comfortably? depends on the day    Diagnostic tests MRI of lumbar spine: L4 disc buldge with possible L5 nerve root impingement    Patient Stated Goals to get rid of the left armpit and arm pain    Currently in Pain? Yes    Pain Score 7     Pain Location Hip    Pain Orientation Right    Pain Descriptors / Indicators Aching    Pain Type Chronic pain    Pain Radiating Towards into right buttock    Pain Onset More than a month ago    Pain Frequency Constant    Aggravating Factors  standing and walking    Pain Relieving Factors nothing    Effect of Pain on Daily Activities difficulty performing ADL's    Multiple Pain Sites No    Pain Onset More than a month ago                               [facility] Adult PT Treatment/Exercise - 07/14/21 0001       Knee/Hip Exercises: Stretches   Other Knee/Hip Stretches piriformis stretch 3x20 sec hold; tennis ball trigger point release to gluteal      Shoulder Exercises: Standing   Row Limitations row machine 3x5 10lbs;    Other Standing Exercises shoulder extension 3x10 with breathing; cuing for set up with machines; dumbell curl 2lb 3x10      Manual Therapy   Manual Therapy Soft tissue mobilization    Manual therapy comments skilled palaption of trigger points    Soft tissue mobilization trigger point release to pieirformis and gluteal    Passive ROM gentle passive stretching of the right LE              Trigger Point Dry Needling  - 07/14/21 0001     Consent Given? Yes    Education Handout Provided Yes    Electrical Stimulation Performed with Dry Needling Yes    Other Dry Needling 2 spots needled in the pirifromis today    Piriformis Response Twitch response  elicited;Palpable increased muscle length                  PT Education - 07/14/21 1119     Education Details Reviewed HEP and effects of TPDN.    Person(s) Educated Patient    Methods Explanation;Demonstration;Verbal cues    Comprehension Verbalized understanding;Returned demonstration;Verbal cues required              PT Short Term Goals - 07/07/21 1222       PT SHORT TERM GOAL #4   Title Patiewnt will increase gross bilateral LE strength to 4+/5    Baseline orking on Strength    Time 3    Period Weeks    Status On-going    Target Date 07/28/21      PT SHORT TERM GOAL #5   Title Patient will be independent with basic HEP for Left LE strengthening and right hip stretching and strengthening    Time 3    Period Weeks    Status On-going    Target Date 04/08/21      PT SHORT TERM GOAL #6   Title Therapy will review FOTO score               PT Long Term Goals - 05/07/21 1111       PT LONG TERM GOAL #1   Title Pt will have painfree left shoulder abduction of 145 degrees so that she can reach into a kitchen cabinet at home    Baseline 134 degrees with 6/10 pain on 02/17/2021, 160 degrees with 2/10 pain    Status Partially Met   much improved     PT LONG TERM GOAL #2   Title Pt will be independent in progressive resistive home exercise program for strength and fitness    Baseline ongoing as pt is participating  in aquatic therapy    Status On-going      PT LONG TERM GOAL #3   Title Pt will report that her overall pain and discomfort is decreased by 50%    Status Achieved                   Plan - 07/14/21 1119     Clinical Impression Statement Patient tolerated treatment well today. she had pain in her gluteal  the pother day with e-stim and needling to the gluteal, but she reports better tolerance to the piriformis needling. Therapy focused on the piriformis area today. Therapy also perfromed light soft tissue mobilization to the gluteal. She reported improved pain with soft tissue mobilization.    Personal Factors and Comorbidities Comorbidity 3+    Comorbidities Previous Lt TKR with persistent pain, Previous Lt breast lumpectomy and radiation, right sciatic pain    Examination-Activity Limitations Carry;Lift    Stability/Clinical Decision Making Evolving/Moderate complexity    Clinical Decision Making Moderate    Rehab Potential Good    Clinical Impairments Affecting Rehab Potential pain in left leg, pain in right leg, decreased tolerance of gait due to pain,    PT Frequency 1x / week    PT Duration 6 weeks    PT Treatment/Interventions ADLs/Self Care Home Management;Therapeutic activities;Therapeutic exercise;Patient/family education;Manual techniques;Manual lymph drainage;Scar mobilization;Passive range of motion;Moist Heat;Balance training;Functional mobility training;Orthotic Fit/Training;Taping;Compression bandaging;Aquatic Therapy;Cryotherapy;Neuromuscular re-education;Dry needling    PT Next Visit Plan continue with aquatic therapy buyt also if spasming continues consider needling with pulsed. continue with HEP and progress as tolerate.    PT Home Exercise Plan Access Code: [facility]  URL: [email or web address]  Date: 07/07/2021  Prepared by: Carolyne Littles    Exercises  Supine Lower Trunk Rotation - 1 x daily - 7 x weekly - 3 sets - 10 reps  Hooklying Single Knee to Chest Stretch - 1 x daily - 7 x weekly - 1 sets - 3 reps - 30 hold  Seated Hamstring Stretch - 1 x daily - 7 x weekly - 3 sets - 3 reps - 20 hold  Supine Piriformis Stretch with Foot on Ground - 1 x daily - 7 x weekly - 3 sets - 3 reps - 20 hold  Supine Quad Set - 1 x daily - 7 x weekly - 3 sets - 10 reps  Supine March - 1 x  daily - 7 x weekly - 3 sets - 10 reps  Seated Gastroc Stretch with Strap - 1 x daily - 7 x weekly - 3 sets - 3 reps - 20sec hold  Supine Posterior Pelvic Tilt - 1 x daily - 7 x weekly - 3 sets - 10 reps  Hooklying Clamshell with Resistance - 1 x daily - 7 x weekly - 3 sets - 10 reps  Seated Hip Abduction with Resistance - 1 x daily - 7 x weekly - 3 sets - 10 reps  Seated March - 1 x daily - 7 x weekly - 3 sets - 10 reps  Ankle and Toe Plantarflexion with Resistance - 1 x daily - 7 x weekly - 3 sets - 10 reps  Ankle Eversion with Resistance - 1 x daily - 7 x weekly - 3 sets - 10 reps    Consulted and Agree with Plan of Care Patient             Patient will benefit from skilled therapeutic intervention in order to improve the following deficits and impairments:  Decreased knowledge of precautions, Impaired UE functional use, Decreased range of motion, Postural dysfunction, Decreased scar mobility, Decreased mobility, Pain, Increased fascial restricitons, Increased edema, Abnormal gait, Difficulty walking, Decreased endurance, Decreased activity tolerance, Impaired perceived functional ability, Decreased knowledge of use of DME, Decreased strength  Visit Diagnosis: Other abnormalities of gait and mobility  Pain in right hip  Chronic pain of left knee  Chronic left shoulder pain  Muscle weakness (generalized)     Problem List Patient Active Problem List   Diagnosis Date Noted   Breast mass, right 03/27/2020   Hepatic steatosis 01/22/2020   Genetic testing 04/02/2018   Family history of breast cancer    Malignant neoplasm of upper-outer quadrant of left breast in female, estrogen receptor positive ([place]) 03/16/2018   OA (osteoarthritis) of knee 09/11/2017   Infrapatellar bursitis of right knee 04/29/2014   Pneumonia, aspiration ([place]) 12/28/2012   HYPOTHYROIDISM 11/15/2010   HYPERLIPIDEMIA 11/15/2010   DEPRESSION 11/15/2010   GERD 11/15/2010   CONSTIPATION 11/15/2010   GALLSTONES  11/15/2010   RENAL CYST 11/15/2010   INSOMNIA UNSPECIFIED 11/15/2010   COUGH 11/15/2010   ABDOMINAL PAIN OTHER SPECIFIED SITE 11/15/2010   TRANSAMINASES, SERUM, ELEVATED 11/15/2010    Carney Living PT DPT  07/14/2021, 4:39 PM  [facility] [address] [place], [place], [address] Phone: [phone]   Fax:  [phone]  Name: [name] MRN: [id number] Date of Birth: Jun 26, 1946

## 2021-07-21 ENCOUNTER — Ambulatory Visit (HOSPITAL_BASED_OUTPATIENT_CLINIC_OR_DEPARTMENT_OTHER): Payer: Medicare Other | Admitting: Physical Therapy

## 2021-08-06 ENCOUNTER — Other Ambulatory Visit: Payer: Self-pay

## 2021-08-06 ENCOUNTER — Ambulatory Visit (HOSPITAL_BASED_OUTPATIENT_CLINIC_OR_DEPARTMENT_OTHER): Payer: Medicare Other | Admitting: Physical Therapy

## 2021-08-06 DIAGNOSIS — G8929 Other chronic pain: Secondary | ICD-10-CM | POA: Diagnosis not present

## 2021-08-06 DIAGNOSIS — M25562 Pain in left knee: Secondary | ICD-10-CM

## 2021-08-06 DIAGNOSIS — M25551 Pain in right hip: Secondary | ICD-10-CM

## 2021-08-06 DIAGNOSIS — M25512 Pain in left shoulder: Secondary | ICD-10-CM | POA: Diagnosis not present

## 2021-08-06 DIAGNOSIS — R2689 Other abnormalities of gait and mobility: Secondary | ICD-10-CM

## 2021-08-06 DIAGNOSIS — M6281 Muscle weakness (generalized): Secondary | ICD-10-CM | POA: Diagnosis not present

## 2021-08-06 NOTE — Therapy (Signed)
[facility] [address] [place], [place], [address] Phone: [phone]   Fax:  [phone]  Physical Therapy Treatment/Progress note  Patient Details  Name: [name] MRN: [id number] Date of Birth: [birth year] Referring Provider (PT): Dr Shon Baton   Progress Note Reporting Period 06/17/2021 to 08/06/2021  See note below for Objective Data and Assessment of Progress/Goals.      Encounter Date: 08/06/2021   PT End of Session - 08/06/21 1538     Visit Number 15    Number of Visits 16    Date for PT Re-Evaluation 09/17/21    PT Start Time [id number]    PT Stop Time 1556    PT Time Calculation (min) 41 min    Activity Tolerance Patient tolerated treatment well    Behavior During Therapy WFL for tasks assessed/performed             Past Medical History:  Diagnosis Date   Arthritis    hands and knees   Complication of anesthesia    aspiration pna; following a colonoscopy, pt requests head of bed elevated if possible.   Constipation    Cough    Depression    Dysrhythmia    RBBB on 06-06-17 ekg    Elevated liver function tests    Esophageal stricture    Family history of breast cancer    Gallstones    Genetic testing 04/02/2018   STAT Breast panel with reflex to Multi-Cancer panel (83 genes) @ [facility] - No pathogenic mutations detected   GERD (gastroesophageal reflux disease)    Hiatal hernia    History of kidney stones    History of radiation therapy 07/09/2018- 08/03/18   Left Breast/ 40.05 Gy in 15 fractions. Left Breast boost 10 Gy in 5 fractions.    Hyperlipemia    Hypothyroidism    Insomnia    Malignant neoplasm of upper-outer quadrant of left female breast ([place])    Nephrolithiasis    Pneumonia 2015   aspirated after colonosopy   Renal cyst     Past Surgical History:  Procedure Laterality Date   BREAST BIOPSY Left 05/31/2019   mri   BREAST BIOPSY Left 04/2019   malignant   BREAST LUMPECTOMY Left  04/17/2018   malignant   BREAST LUMPECTOMY WITH RADIOACTIVE SEED AND SENTINEL LYMPH NODE BIOPSY Left 04/17/2018   Procedure: LEFT BREAST LUMPECTOMY WITH BRACKETED RADIOACTIVE SEEDS AND SENTINEL LYMPH NODE BIOPSY;  Surgeon: Rolm Bookbinder, MD;  Location: [facility];  Service: General;  Laterality: Left;   BREAST SURGERY Left    Lumpectomy   CARDIOVASCULAR STRESS TEST  07/19/2006   EF 70%, NO EVIDENCE OF ISCHEMIA   CHOLECYSTECTOMY  12/30/10   COLONOSCOPY     DILATION AND CURETTAGE OF UTERUS     HYSTEROSCOPY WITH D & C N/A 06/07/2018   Procedure: DILATATION AND CURETTAGE /HYSTEROSCOPY;  Surgeon: [name], MD;  Location: [facility];  Service: Gynecology;  Laterality: N/A;   inguinal herniography     NASAL SINUS SURGERY     RADIOACTIVE SEED GUIDED EXCISIONAL BREAST BIOPSY Right 04/17/2018   Procedure: RIGHT BREAST SEED GUIDED EXCISIONAL BIOPSY;  Surgeon: Rolm Bookbinder, MD;  Location: [facility];  Service: General;  Laterality: Right;   RE-EXCISION OF BREAST LUMPECTOMY Left 05/15/2018   Procedure: RE-EXCISION OF LEFT BREAST LUMPECTOMY, ASPIRATION OF LEFT AXILLARY SEROMA;  Surgeon: Rolm Bookbinder, MD;  Location: [facility];  Service: General;  Laterality: Left;  T & A     TOOTH EXTRACTION     TOTAL KNEE ARTHROPLASTY Left 09/11/2017   Procedure: LEFT TOTAL KNEE ARTHROPLASTY;  Surgeon: Gaynelle Arabian, MD;  Location: [facility];  Service: Orthopedics;  Laterality: Left;  with block   US ECHOCARDIOGRAPHY  07/17/2006   EF 55-60%    There were no vitals filed for this visit.       [facility] PT Assessment - 08/06/21 0001       Assessment   Medical Diagnosis Right Low Back Pain/ Left Knee Pain    Referring Provider (PT) Dr Shon Baton      Strength   Right Hip Flexion 4+/5    Right Hip ABduction 4+/5    Right Hip ADduction 4+/5    Left Hip Flexion 4+/5    Left Hip ABduction 4+/5    Left Hip ADduction 4+/5    Left Knee Flexion 4+/5    Left Knee  Extension 4+/5      Transfers   Comments 5x sit to stand test 26 seconds      Ambulation/Gait   Gait Comments 6 min walk test. Decreased hip lfexion as the test progressed but able to walk all 6 minutes.                           [facility] Adult PT Treatment/Exercise - 08/06/21 0001       Manual Therapy   Manual Therapy Soft tissue mobilization    Manual therapy comments skilled palaption of trigger points    Soft tissue mobilization trigger point release to pieirformis and gluteal    Passive ROM gentle passive stretching of the right LE    Manual Traction LAD grade II and III ocicaltions to bilateral hips                      PT Short Term Goals - 08/06/21 1628       PT SHORT TERM GOAL #3   Title Pateitn will improve left single leg stance time to 10 sec with min UE assist    Baseline not tested today    Time 3    Period Weeks    Status On-going      PT SHORT TERM GOAL #4   Title Patiewnt will increase gross bilateral LE strength to 4+/5    Baseline gross bilateral strength 4+/5    Time 3    Period Weeks    Status Achieved    Target Date 08/06/21      PT SHORT TERM GOAL #5   Title Patient will be independent with basic HEP for Left LE strengthening and right hip stretching and strengthening    Baseline therapy continues to work on developing full program    Time 3    Period Weeks    Status On-going    Target Date 09/17/21      PT SHORT TERM GOAL #6   Title Patient will decrease 5x sit to stand test by 5 seconds    Time 3    Period Weeks    Status New    Target Date 08/27/21               PT Long Term Goals - 08/06/21 1630       PT LONG TERM GOAL #1   Title Patient will ambualte 1500' with 6 min walk test in order to improve ability to ambualte int he community  Time 4    Period Weeks    Status New    Target Date 09/17/21      PT LONG TERM GOAL #3   Title Pt will report that her overall pain and discomfort is decreased  by 50%    Baseline no significant improvement at this time    Time 6    Period Weeks    Status On-going    Target Date 09/17/21      PT LONG TERM GOAL #5   Title Patient will report <3/10 pain with ADL's in her left knee and right hip    Time 6    Period Weeks    Status On-going      PT LONG TERM GOAL #6   Title sit to stand 5 times </= 13 seconds due to improve strength and endurance    Time 6    Period Weeks    Status New                   Plan - 08/06/21 1618     Clinical Impression Statement Patient continues to make funtional progress. Her pain has not changed mucxh. She continues to have sciatica pain that flairs up and left knee pain that flairs up when sheambualtes. Since her intial visit she has began walking in the pool and recently has begun riding an exercise bike. Therapy was able to perf4rom a 6 minute walk test today. She was able to walk all 6 minutes for a distance of 980 feet. She alos completed a 6 min sit to stand test. Her hip and knee strength have improved. Therapy will continue to progress patients land exercises as tolerated, continue to progress HEP, and continue to work towards a full HEP 1W6.    Personal Factors and Comorbidities Comorbidity 3+    Comorbidities Previous Lt TKR with persistent pain, Previous Lt breast lumpectomy and radiation, right sciatic pain    Examination-Activity Limitations Carry;Lift    Stability/Clinical Decision Making Evolving/Moderate complexity    Clinical Decision Making Moderate    Rehab Potential Good    Clinical Impairments Affecting Rehab Potential pain in left leg, pain in right leg, decreased tolerance of gait due to pain,    PT Frequency 1x / week    PT Duration 6 weeks    PT Treatment/Interventions ADLs/Self Care Home Management;Therapeutic activities;Therapeutic exercise;Patient/family education;Manual techniques;Manual lymph drainage;Scar mobilization;Passive range of motion;Moist Heat;Balance  training;Functional mobility training;Orthotic Fit/Training;Taping;Compression bandaging;Aquatic Therapy;Cryotherapy;Neuromuscular re-education;Dry needling    PT Next Visit Plan continue with aquatic therapy buyt also if spasming continues consider needling with pulsed. continue with HEP and progress as tolerate.    PT Home Exercise Plan Access Code: [email or web address]  URL: [email or web address]  Date: 07/07/2021  Prepared by: Carolyne Littles    Exercises  Supine Lower Trunk Rotation - 1 x daily - 7 x weekly - 3 sets - 10 reps  Hooklying Single Knee to Chest Stretch - 1 x daily - 7 x weekly - 1 sets - 3 reps - 30 hold  Seated Hamstring Stretch - 1 x daily - 7 x weekly - 3 sets - 3 reps - 20 hold  Supine Piriformis Stretch with Foot on Ground - 1 x daily - 7 x weekly - 3 sets - 3 reps - 20 hold  Supine Quad Set - 1 x daily - 7 x weekly - 3 sets - 10 reps  Supine March - 1 x daily - 7 x weekly - 3 sets - 10  reps  Seated Gastroc Stretch with Strap - 1 x daily - 7 x weekly - 3 sets - 3 reps - 20sec hold  Supine Posterior Pelvic Tilt - 1 x daily - 7 x weekly - 3 sets - 10 reps  Hooklying Clamshell with Resistance - 1 x daily - 7 x weekly - 3 sets - 10 reps  Seated Hip Abduction with Resistance - 1 x daily - 7 x weekly - 3 sets - 10 reps  Seated March - 1 x daily - 7 x weekly - 3 sets - 10 reps  Ankle and Toe Plantarflexion with Resistance - 1 x daily - 7 x weekly - 3 sets - 10 reps  Ankle Eversion with Resistance - 1 x daily - 7 x weekly - 3 sets - 10 reps    Consulted and Agree with Plan of Care Patient             Patient will benefit from skilled therapeutic intervention in order to improve the following deficits and impairments:  Decreased knowledge of precautions, Impaired UE functional use, Decreased range of motion, Postural dysfunction, Decreased scar mobility, Decreased mobility, Pain, Increased fascial restricitons, Increased edema, Abnormal gait, Difficulty walking, Decreased endurance,  Decreased activity tolerance, Impaired perceived functional ability, Decreased knowledge of use of DME, Decreased strength  Visit Diagnosis: Other abnormalities of gait and mobility  Pain in right hip  Chronic pain of left knee     Problem List Patient Active Problem List   Diagnosis Date Noted   Breast mass, right 03/27/2020   Hepatic steatosis 01/22/2020   Genetic testing 04/02/2018   Family history of breast cancer    Malignant neoplasm of upper-outer quadrant of left breast in female, estrogen receptor positive ([place]) 03/16/2018   OA (osteoarthritis) of knee 09/11/2017   Infrapatellar bursitis of right knee 04/29/2014   Pneumonia, aspiration ([place]) 12/28/2012   HYPOTHYROIDISM 11/15/2010   HYPERLIPIDEMIA 11/15/2010   DEPRESSION 11/15/2010   GERD 11/15/2010   CONSTIPATION 11/15/2010   GALLSTONES 11/15/2010   RENAL CYST 11/15/2010   INSOMNIA UNSPECIFIED 11/15/2010   COUGH 11/15/2010   ABDOMINAL PAIN OTHER SPECIFIED SITE 11/15/2010   TRANSAMINASES, SERUM, ELEVATED 11/15/2010    Carney Living PT DPT  08/06/2021, 4:36 PM  [facility] [address] [place], [place], [address] Phone: [phone]   Fax:  [phone]  Name: [name] MRN: [id number] Date of Birth: 1946-03-20

## 2021-08-10 ENCOUNTER — Encounter (HOSPITAL_BASED_OUTPATIENT_CLINIC_OR_DEPARTMENT_OTHER): Payer: Self-pay | Admitting: Physical Therapy

## 2021-08-10 ENCOUNTER — Ambulatory Visit (HOSPITAL_BASED_OUTPATIENT_CLINIC_OR_DEPARTMENT_OTHER): Payer: Medicare Other | Admitting: Physical Therapy

## 2021-08-10 ENCOUNTER — Other Ambulatory Visit: Payer: Self-pay

## 2021-08-10 DIAGNOSIS — G8929 Other chronic pain: Secondary | ICD-10-CM

## 2021-08-10 DIAGNOSIS — M25512 Pain in left shoulder: Secondary | ICD-10-CM | POA: Diagnosis not present

## 2021-08-10 DIAGNOSIS — M25551 Pain in right hip: Secondary | ICD-10-CM

## 2021-08-10 DIAGNOSIS — M6281 Muscle weakness (generalized): Secondary | ICD-10-CM | POA: Diagnosis not present

## 2021-08-10 DIAGNOSIS — R2689 Other abnormalities of gait and mobility: Secondary | ICD-10-CM | POA: Diagnosis not present

## 2021-08-10 DIAGNOSIS — M25562 Pain in left knee: Secondary | ICD-10-CM | POA: Diagnosis not present

## 2021-08-11 NOTE — Therapy (Signed)
[facility] [place], [place], [address] Phone: [phone]   Fax:  [phone]  Physical Therapy Treatment  Patient Details  Name: [name] MRN: [id number] Date of Birth: [birth year] Referring Provider (PT): Dr Shon Baton   Encounter Date: 08/10/2021   PT End of Session - 08/11/21 1256     Visit Number 16    Number of Visits 21    Date for PT Re-Evaluation 09/17/21    Authorization Type begin KX 2nd to 15 visit limit reached    Authorization - Visit Number 1    Authorization - Number of Visits 6    PT Start Time 1600    PT Stop Time 1640    PT Time Calculation (min) 40 min    Activity Tolerance Patient tolerated treatment well    Behavior During Therapy [facility] for tasks assessed/performed             Past Medical History:  Diagnosis Date   Arthritis    hands and knees   Complication of anesthesia    aspiration pna; following a colonoscopy, pt requests head of bed elevated if possible.   Constipation    Cough    Depression    Dysrhythmia    RBBB on 06-06-17 ekg    Elevated liver function tests    Esophageal stricture    Family history of breast cancer    Gallstones    Genetic testing 04/02/2018   STAT Breast panel with reflex to Multi-Cancer panel (83 genes) @ [facility] - No pathogenic mutations detected   GERD (gastroesophageal reflux disease)    Hiatal hernia    History of kidney stones    History of radiation therapy 07/09/2018- 08/03/18   Left Breast/ 40.05 Gy in 15 fractions. Left Breast boost 10 Gy in 5 fractions.    Hyperlipemia    Hypothyroidism    Insomnia    Malignant neoplasm of upper-outer quadrant of left female breast ([place])    Nephrolithiasis    Pneumonia 2015   aspirated after colonosopy   Renal cyst     Past Surgical History:  Procedure Laterality Date   BREAST BIOPSY Left 05/31/2019   mri   BREAST BIOPSY Left 04/2019   malignant   BREAST LUMPECTOMY Left 04/17/2018    malignant   BREAST LUMPECTOMY WITH RADIOACTIVE SEED AND SENTINEL LYMPH NODE BIOPSY Left 04/17/2018   Procedure: LEFT BREAST LUMPECTOMY WITH BRACKETED RADIOACTIVE SEEDS AND SENTINEL LYMPH NODE BIOPSY;  Surgeon: Rolm Bookbinder, MD;  Location: [facility];  Service: General;  Laterality: Left;   BREAST SURGERY Left    Lumpectomy   CARDIOVASCULAR STRESS TEST  07/19/2006   EF 70%, NO EVIDENCE OF ISCHEMIA   CHOLECYSTECTOMY  12/30/10   COLONOSCOPY     DILATION AND CURETTAGE OF UTERUS     HYSTEROSCOPY WITH D & C N/A 06/07/2018   Procedure: DILATATION AND CURETTAGE /HYSTEROSCOPY;  Surgeon: [name], MD;  Location: [facility];  Service: Gynecology;  Laterality: N/A;   inguinal herniography     NASAL SINUS SURGERY     RADIOACTIVE SEED GUIDED EXCISIONAL BREAST BIOPSY Right 04/17/2018   Procedure: RIGHT BREAST SEED GUIDED EXCISIONAL BIOPSY;  Surgeon: Rolm Bookbinder, MD;  Location: [facility];  Service: General;  Laterality: Right;   RE-EXCISION OF BREAST LUMPECTOMY Left 05/15/2018   Procedure: RE-EXCISION OF LEFT BREAST LUMPECTOMY, ASPIRATION OF LEFT AXILLARY SEROMA;  Surgeon: Rolm Bookbinder, MD;  Location: [facility];  Service:  General;  Laterality: Left;   T & A     TOOTH EXTRACTION     TOTAL KNEE ARTHROPLASTY Left 09/11/2017   Procedure: LEFT TOTAL KNEE ARTHROPLASTY;  Surgeon: Gaynelle Arabian, MD;  Location: [facility];  Service: Orthopedics;  Laterality: Left;  with block   US ECHOCARDIOGRAPHY  07/17/2006   EF 55-60%    There were no vitals filed for this visit.   Subjective Assessment - 08/11/21 1301     Subjective Patient went to the gym and now has significant lower back pain. She has her exercises that she performed. She is having difficulty walking on her right leg.    Pertinent History Patient was diagnosed on 03/08/18 with left grade 1-2 invasive ductal carcinoma breast cancer.It measured 1.5 cm and is located in the upper outer quadrant. It is  ER/PR positive and HER2 negative with a Ki67 of < 1%.  She had a left total knee replacement in 10/18 with Dr. Wynelle Link and continues to have pain in her left lateral knee. Her orthopedist reported it is likely due to her iliotibial band. Patient reports she underwent a left lumpectomy and SNLB (0/4 nodes positive) removed on 04/17/18.She completed radiation, but no chemo    Limitations Standing;Walking    How long can you stand comfortably? limited time before she has pain    How long can you walk comfortably? depends on the day    Diagnostic tests MRI of lumbar spine: L4 disc buldge with possible L5 nerve root impingement    Patient Stated Goals to get rid of the left armpit and arm pain    Currently in Pain? Yes    Pain Score 9     Pain Location Back    Pain Orientation Right    Pain Descriptors / Indicators Aching    Pain Type Chronic pain    Pain Radiating Towards into right lateral shin    Pain Onset More than a month ago    Pain Frequency Constant    Aggravating Factors  standing and walking    Pain Relieving Factors nothing    Effect of Pain on Daily Activities difficulty perfroming ADL's    Multiple Pain Sites No                               [facility] Adult PT Treatment/Exercise - 08/11/21 0001       Self-Care   Other Self-Care Comments  reviewed her current gym plan given to her by her fitness center; cut out weighted extensions and flexion      Manual Therapy   Manual Therapy Soft tissue mobilization    Soft tissue mobilization trigger point release to pirformis and gluteal    Passive ROM gentle passive stretching of the right LE    Manual Traction LAD grade II and III ocicaltions to bilateral hips                    PT Education - 08/11/21 1255     Education Details reviewed POC over the next few days.    Person(s) Educated Patient    Methods Explanation;Demonstration;Tactile cues;Verbal cues    Comprehension Verbalized understanding;Verbal  cues required;Tactile cues required;Returned demonstration              PT Short Term Goals - 08/06/21 1628       PT SHORT TERM GOAL #3   Title Pateitn will improve left single leg  stance time to 10 sec with min UE assist    Baseline not tested today    Time 3    Period Weeks    Status On-going      PT SHORT TERM GOAL #4   Title Patiewnt will increase gross bilateral LE strength to 4+/5    Baseline gross bilateral strength 4+/5    Time 3    Period Weeks    Status Achieved    Target Date 08/06/21      PT SHORT TERM GOAL #5   Title Patient will be independent with basic HEP for Left LE strengthening and right hip stretching and strengthening    Baseline therapy continues to work on developing full program    Time 3    Period Weeks    Status On-going    Target Date 09/17/21      PT SHORT TERM GOAL #6   Title Patient will decrease 5x sit to stand test by 5 seconds    Time 3    Period Weeks    Status New    Target Date 08/27/21               PT Long Term Goals - 08/06/21 1630       PT LONG TERM GOAL #1   Title Patient will ambualte 1500' with 6 min walk test in order to improve ability to ambualte int he community    Time 4    Period Weeks    Status New    Target Date 09/17/21      PT LONG TERM GOAL #3   Title Pt will report that her overall pain and discomfort is decreased by 50%    Baseline no significant improvement at this time    Time 6    Period Weeks    Status On-going    Target Date 09/17/21      PT LONG TERM GOAL #5   Title Patient will report <3/10 pain with ADL's in her left knee and right hip    Time 6    Period Weeks    Status On-going      PT LONG TERM GOAL #6   Title sit to stand 5 times </= 13 seconds due to improve strength and endurance    Time 6    Period Weeks    Status New                   Plan - 08/11/21 1257     Clinical Impression Statement Patient was in significnat pain today follwing her session at the  gym,. She was storngly advised not to do weighted exetensions and weighted flexion. She tolerated manual therapy well. She reported a significant improvement in pain follwing treatment. She felt pain in her lateral shin with trigger point release to her lumbar spine. Therapy spoke with her husband about doing light traction insteady of the thera-gun. Therapy will review gym exercises with her next visit if her inflammation is down.    Personal Factors and Comorbidities Comorbidity 3+    Comorbidities Previous Lt TKR with persistent pain, Previous Lt breast lumpectomy and radiation, right sciatic pain    Examination-Activity Limitations Carry;Lift    Stability/Clinical Decision Making Evolving/Moderate complexity    Clinical Decision Making Moderate    Rehab Potential Good    Clinical Impairments Affecting Rehab Potential pain in left leg, pain in right leg, decreased tolerance of gait due to pain,    PT Frequency 1x /  week    PT Duration 6 weeks    PT Treatment/Interventions ADLs/Self Care Home Management;Therapeutic activities;Therapeutic exercise;Patient/family education;Manual techniques;Manual lymph drainage;Scar mobilization;Passive range of motion;Moist Heat;Balance training;Functional mobility training;Orthotic Fit/Training;Taping;Compression bandaging;Aquatic Therapy;Cryotherapy;Neuromuscular re-education;Dry needling    PT Next Visit Plan manual therapy and review exercises.    PT Home Exercise Plan Access Code: [email or web address]  URL: [email or web address]  Date: 07/07/2021  Prepared by: Carolyne Littles    Exercises  Supine Lower Trunk Rotation - 1 x daily - 7 x weekly - 3 sets - 10 reps  Hooklying Single Knee to Chest Stretch - 1 x daily - 7 x weekly - 1 sets - 3 reps - 30 hold  Seated Hamstring Stretch - 1 x daily - 7 x weekly - 3 sets - 3 reps - 20 hold  Supine Piriformis Stretch with Foot on Ground - 1 x daily - 7 x weekly - 3 sets - 3 reps - 20 hold  Supine Quad Set - 1 x daily - 7 x  weekly - 3 sets - 10 reps  Supine March - 1 x daily - 7 x weekly - 3 sets - 10 reps  Seated Gastroc Stretch with Strap - 1 x daily - 7 x weekly - 3 sets - 3 reps - 20sec hold  Supine Posterior Pelvic Tilt - 1 x daily - 7 x weekly - 3 sets - 10 reps  Hooklying Clamshell with Resistance - 1 x daily - 7 x weekly - 3 sets - 10 reps  Seated Hip Abduction with Resistance - 1 x daily - 7 x weekly - 3 sets - 10 reps  Seated March - 1 x daily - 7 x weekly - 3 sets - 10 reps  Ankle and Toe Plantarflexion with Resistance - 1 x daily - 7 x weekly - 3 sets - 10 reps  Ankle Eversion with Resistance - 1 x daily - 7 x weekly - 3 sets - 10 reps             Patient will benefit from skilled therapeutic intervention in order to improve the following deficits and impairments:  Decreased knowledge of precautions, Impaired UE functional use, Decreased range of motion, Postural dysfunction, Decreased scar mobility, Decreased mobility, Pain, Increased fascial restricitons, Increased edema, Abnormal gait, Difficulty walking, Decreased endurance, Decreased activity tolerance, Impaired perceived functional ability, Decreased knowledge of use of DME, Decreased strength  Visit Diagnosis: Other abnormalities of gait and mobility  Pain in right hip  Chronic pain of left knee  Chronic left shoulder pain     Problem List Patient Active Problem List   Diagnosis Date Noted   Breast mass, right 03/27/2020   Hepatic steatosis 01/22/2020   Genetic testing 04/02/2018   Family history of breast cancer    Malignant neoplasm of upper-outer quadrant of left breast in female, estrogen receptor positive ([place]) 03/16/2018   OA (osteoarthritis) of knee 09/11/2017   Infrapatellar bursitis of right knee 04/29/2014   Pneumonia, aspiration ([place]) 12/28/2012   HYPOTHYROIDISM 11/15/2010   HYPERLIPIDEMIA 11/15/2010   DEPRESSION 11/15/2010   GERD 11/15/2010   CONSTIPATION 11/15/2010   GALLSTONES 11/15/2010   RENAL CYST 11/15/2010    INSOMNIA UNSPECIFIED 11/15/2010   COUGH 11/15/2010   ABDOMINAL PAIN OTHER SPECIFIED SITE 11/15/2010   TRANSAMINASES, SERUM, ELEVATED 11/15/2010    Carney Living 08/11/2021, 1:03 PM  [facility] [address] [place], [place], [address] Phone: [phone]   Fax:  [phone]  Name: Angela Lizaola  Charles MRN: [id number] Date of Birth: 05/25/1946

## 2021-08-17 ENCOUNTER — Encounter: Payer: Self-pay | Admitting: Physical Medicine & Rehabilitation

## 2021-08-18 ENCOUNTER — Other Ambulatory Visit: Payer: Self-pay

## 2021-08-18 ENCOUNTER — Ambulatory Visit (HOSPITAL_BASED_OUTPATIENT_CLINIC_OR_DEPARTMENT_OTHER): Payer: Medicare Other | Attending: Internal Medicine | Admitting: Physical Therapy

## 2021-08-18 ENCOUNTER — Encounter (HOSPITAL_BASED_OUTPATIENT_CLINIC_OR_DEPARTMENT_OTHER): Payer: Self-pay | Admitting: Physical Therapy

## 2021-08-18 DIAGNOSIS — R2689 Other abnormalities of gait and mobility: Secondary | ICD-10-CM | POA: Insufficient documentation

## 2021-08-18 DIAGNOSIS — M25551 Pain in right hip: Secondary | ICD-10-CM | POA: Diagnosis not present

## 2021-08-18 DIAGNOSIS — M25512 Pain in left shoulder: Secondary | ICD-10-CM | POA: Diagnosis not present

## 2021-08-18 DIAGNOSIS — M6281 Muscle weakness (generalized): Secondary | ICD-10-CM | POA: Diagnosis not present

## 2021-08-18 DIAGNOSIS — M25562 Pain in left knee: Secondary | ICD-10-CM | POA: Insufficient documentation

## 2021-08-18 DIAGNOSIS — G8929 Other chronic pain: Secondary | ICD-10-CM | POA: Insufficient documentation

## 2021-08-18 DIAGNOSIS — L599 Disorder of the skin and subcutaneous tissue related to radiation, unspecified: Secondary | ICD-10-CM | POA: Insufficient documentation

## 2021-08-19 ENCOUNTER — Encounter (HOSPITAL_BASED_OUTPATIENT_CLINIC_OR_DEPARTMENT_OTHER): Payer: Self-pay | Admitting: Physical Therapy

## 2021-08-19 NOTE — Therapy (Signed)
[facility] [address] [place], [place], [address] Phone: [phone]   Fax:  [phone]  Physical Therapy Treatment  Patient Details  Name: [name] MRN: [id number] Date of Birth: 11-26-1946 Referring Provider (PT): Dr Shon Baton   Encounter Date: 08/18/2021   PT End of Session - 08/19/21 2103     Visit Number 17    Number of Visits 21    Date for PT Re-Evaluation 09/17/21    Authorization Type begin KX 2nd to 15 visit limit reached    PT Start Time 1345    PT Stop Time 1427    PT Time Calculation (min) 42 min    Activity Tolerance Patient tolerated treatment well    Behavior During Therapy [facility] for tasks assessed/performed             Past Medical History:  Diagnosis Date   Arthritis    hands and knees   Complication of anesthesia    aspiration pna; following a colonoscopy, pt requests head of bed elevated if possible.   Constipation    Cough    Depression    Dysrhythmia    RBBB on 06-06-17 ekg    Elevated liver function tests    Esophageal stricture    Family history of breast cancer    Gallstones    Genetic testing 04/02/2018   STAT Breast panel with reflex to Multi-Cancer panel (83 genes) @ [facility] - No pathogenic mutations detected   GERD (gastroesophageal reflux disease)    Hiatal hernia    History of kidney stones    History of radiation therapy 07/09/2018- 08/03/18   Left Breast/ 40.05 Gy in 15 fractions. Left Breast boost 10 Gy in 5 fractions.    Hyperlipemia    Hypothyroidism    Insomnia    Malignant neoplasm of upper-outer quadrant of left female breast ([place])    Nephrolithiasis    Pneumonia 2015   aspirated after colonosopy   Renal cyst     Past Surgical History:  Procedure Laterality Date   BREAST BIOPSY Left 05/31/2019   mri   BREAST BIOPSY Left 04/2019   malignant   BREAST LUMPECTOMY Left 04/17/2018   malignant   BREAST LUMPECTOMY WITH RADIOACTIVE SEED AND SENTINEL LYMPH  NODE BIOPSY Left 04/17/2018   Procedure: LEFT BREAST LUMPECTOMY WITH BRACKETED RADIOACTIVE SEEDS AND SENTINEL LYMPH NODE BIOPSY;  Surgeon: Rolm Bookbinder, MD;  Location: [place];  Service: General;  Laterality: Left;   BREAST SURGERY Left    Lumpectomy   CARDIOVASCULAR STRESS TEST  07/19/2006   EF 70%, NO EVIDENCE OF ISCHEMIA   CHOLECYSTECTOMY  12/30/10   COLONOSCOPY     DILATION AND CURETTAGE OF UTERUS     HYSTEROSCOPY WITH D & C N/A 06/07/2018   Procedure: DILATATION AND CURETTAGE /HYSTEROSCOPY;  Surgeon: [name], MD;  Location: [facility];  Service: Gynecology;  Laterality: N/A;   inguinal herniography     NASAL SINUS SURGERY     RADIOACTIVE SEED GUIDED EXCISIONAL BREAST BIOPSY Right 04/17/2018   Procedure: RIGHT BREAST SEED GUIDED EXCISIONAL BIOPSY;  Surgeon: Rolm Bookbinder, MD;  Location: [place];  Service: General;  Laterality: Right;   RE-EXCISION OF BREAST LUMPECTOMY Left 05/15/2018   Procedure: RE-EXCISION OF LEFT BREAST LUMPECTOMY, ASPIRATION OF LEFT AXILLARY SEROMA;  Surgeon: Rolm Bookbinder, MD;  Location: [place];  Service: General;  Laterality: Left;   T & A     TOOTH EXTRACTION  TOTAL KNEE ARTHROPLASTY Left 09/11/2017   Procedure: LEFT TOTAL KNEE ARTHROPLASTY;  Surgeon: Gaynelle Arabian, MD;  Location: [facility];  Service: Orthopedics;  Laterality: Left;  with block   US ECHOCARDIOGRAPHY  07/17/2006   EF 55-60%    There were no vitals filed for this visit.   Subjective Assessment - 08/19/21 2101     Subjective Patient is doing better then she did the last week. She rode 4 miles on the bie this [place]. her hip hurt afterwards but she is able to manage. She only hurt for a few days after the last session. The pain eased off after a few days.    Pertinent History Patient was diagnosed on 03/08/18 with left grade 1-2 invasive ductal carcinoma breast cancer.It measured 1.5 cm and is located in the upper outer quadrant. It  is ER/PR positive and HER2 negative with a Ki67 of < 1%.  She had a left total knee replacement in 10/18 with Dr. Wynelle Link and continues to have pain in her left lateral knee. Her orthopedist reported it is likely due to her iliotibial band. Patient reports she underwent a left lumpectomy and SNLB (0/4 nodes positive) removed on 04/17/18.She completed radiation, but no chemo    Limitations Standing;Walking    How long can you stand comfortably? limited time before she has pain    How long can you walk comfortably? depends on the day    Diagnostic tests MRI of lumbar spine: L4 disc buldge with possible L5 nerve root impingement    Currently in Pain? Yes    Pain Score 5     Pain Location Back    Pain Orientation Right    Pain Descriptors / Indicators Aching    Pain Type Chronic pain    Pain Onset More than a month ago    Pain Frequency Constant    Aggravating Factors  standing and walking    Pain Relieving Factors rest    Effect of Pain on Daily Activities difficulty performing ADL's    Multiple Pain Sites No                               [facility] Adult PT Treatment/Exercise - 08/19/21 0001       Lumbar Exercises: Machines for Strengthening   Other Lumbar Machine Exercise cable row 3x10 10lb; cable extension 3x10 10lbs; pallof press 2x10 each direction 5lbs; row machine 3x10; leg press 3x10 40 lbs; hip abdcution 3x10 30 lbs      Knee/Hip Exercises: Stretches   Other Knee/Hip Stretches piriformis stretch 3x20 sec hold; tennis ball trigger point release to gluteal    Other Knee/Hip Stretches LTR 2x10 in limited positions      Manual Therapy   Soft tissue mobilization trigger point release to pirformis and gluteal; roller to gluteal    Manual Traction LAD grade II and III ocicaltions to bilateral hips                     PT Education - 08/19/21 2102     Education Details reviewed priper set up of gym equipement    Person(s) Educated Patient    Methods  Explanation;Demonstration;Tactile cues;Verbal cues    Comprehension Verbalized understanding;Returned demonstration;Verbal cues required;Tactile cues required              PT Short Term Goals - 08/06/21 1628       PT SHORT TERM GOAL #3  Title Pateitn will improve left single leg stance time to 10 sec with min UE assist    Baseline not tested today    Time 3    Period Weeks    Status On-going      PT SHORT TERM GOAL #4   Title Patiewnt will increase gross bilateral LE strength to 4+/5    Baseline gross bilateral strength 4+/5    Time 3    Period Weeks    Status Achieved    Target Date 08/06/21      PT SHORT TERM GOAL #5   Title Patient will be independent with basic HEP for Left LE strengthening and right hip stretching and strengthening    Baseline therapy continues to work on developing full program    Time 3    Period Weeks    Status On-going    Target Date 09/17/21      PT SHORT TERM GOAL #6   Title Patient will decrease 5x sit to stand test by 5 seconds    Time 3    Period Weeks    Status New    Target Date 08/27/21               PT Long Term Goals - 08/06/21 1630       PT LONG TERM GOAL #1   Title Patient will ambualte 1500' with 6 min walk test in order to improve ability to ambualte int he community    Time 4    Period Weeks    Status New    Target Date 09/17/21      PT LONG TERM GOAL #3   Title Pt will report that her overall pain and discomfort is decreased by 50%    Baseline no significant improvement at this time    Time 6    Period Weeks    Status On-going    Target Date 09/17/21      PT LONG TERM GOAL #5   Title Patient will report <3/10 pain with ADL's in her left knee and right hip    Time 6    Period Weeks    Status On-going      PT LONG TERM GOAL #6   Title sit to stand 5 times </= 13 seconds due to improve strength and endurance    Time 6    Period Weeks    Status New                   Plan - 08/19/21 2104      Clinical Impression Statement Patient tolerated gym exercises well. She was started with lowweight exercises annd high reps. She had no pain. She was advised to avoid weight flexion and extension machines, which shew as given at her previous gym. She had minor difficulty with pallof press and will likely not perform although she was advised it is a good exercise for her particular issue.    Personal Factors and Comorbidities Comorbidity 3+    Comorbidities Previous Lt TKR with persistent pain, Previous Lt breast lumpectomy and radiation, right sciatic pain    Clinical Decision Making Moderate    Rehab Potential Good    Clinical Impairments Affecting Rehab Potential pain in left leg, pain in right leg, decreased tolerance of gait due to pain,    PT Frequency 1x / week    PT Duration 6 weeks    PT Treatment/Interventions ADLs/Self Care Home Management;Therapeutic activities;Therapeutic exercise;Patient/family education;Manual techniques;Manual lymph drainage;Scar mobilization;Passive range  of motion;Moist Heat;Balance training;Functional mobility training;Orthotic Fit/Training;Taping;Compression bandaging;Aquatic Therapy;Cryotherapy;Neuromuscular re-education;Dry needling    PT Next Visit Plan manual therapy and review exercises.    PT Home Exercise Plan Access Code: [email or web address]  URL: [email or web address]  Date: 07/07/2021  Prepared by: Carolyne Littles    Exercises  Supine Lower Trunk Rotation - 1 x daily - 7 x weekly - 3 sets - 10 reps  Hooklying Single Knee to Chest Stretch - 1 x daily - 7 x weekly - 1 sets - 3 reps - 30 hold  Seated Hamstring Stretch - 1 x daily - 7 x weekly - 3 sets - 3 reps - 20 hold  Supine Piriformis Stretch with Foot on Ground - 1 x daily - 7 x weekly - 3 sets - 3 reps - 20 hold  Supine Quad Set - 1 x daily - 7 x weekly - 3 sets - 10 reps  Supine March - 1 x daily - 7 x weekly - 3 sets - 10 reps  Seated Gastroc Stretch with Strap - 1 x daily - 7 x weekly - 3 sets - 3  reps - 20sec hold  Supine Posterior Pelvic Tilt - 1 x daily - 7 x weekly - 3 sets - 10 reps  Hooklying Clamshell with Resistance - 1 x daily - 7 x weekly - 3 sets - 10 reps  Seated Hip Abduction with Resistance - 1 x daily - 7 x weekly - 3 sets - 10 reps  Seated March - 1 x daily - 7 x weekly - 3 sets - 10 reps  Ankle and Toe Plantarflexion with Resistance - 1 x daily - 7 x weekly - 3 sets - 10 reps  Ankle Eversion with Resistance - 1 x daily - 7 x weekly - 3 sets - 10 reps    Consulted and Agree with Plan of Care Patient             Patient will benefit from skilled therapeutic intervention in order to improve the following deficits and impairments:  Decreased knowledge of precautions, Impaired UE functional use, Decreased range of motion, Postural dysfunction, Decreased scar mobility, Decreased mobility, Pain, Increased fascial restricitons, Increased edema, Abnormal gait, Difficulty walking, Decreased endurance, Decreased activity tolerance, Impaired perceived functional ability, Decreased knowledge of use of DME, Decreased strength  Visit Diagnosis: Other abnormalities of gait and mobility  Pain in right hip  Chronic pain of left knee  Chronic left shoulder pain  Muscle weakness (generalized)     Problem List Patient Active Problem List   Diagnosis Date Noted   Breast mass, right 03/27/2020   Hepatic steatosis 01/22/2020   Genetic testing 04/02/2018   Family history of breast cancer    Malignant neoplasm of upper-outer quadrant of left breast in female, estrogen receptor positive ([place]) 03/16/2018   OA (osteoarthritis) of knee 09/11/2017   Infrapatellar bursitis of right knee 04/29/2014   Pneumonia, aspiration ([place]) 12/28/2012   HYPOTHYROIDISM 11/15/2010   HYPERLIPIDEMIA 11/15/2010   DEPRESSION 11/15/2010   GERD 11/15/2010   CONSTIPATION 11/15/2010   GALLSTONES 11/15/2010   RENAL CYST 11/15/2010   INSOMNIA UNSPECIFIED 11/15/2010   COUGH 11/15/2010   ABDOMINAL PAIN  OTHER SPECIFIED SITE 11/15/2010   TRANSAMINASES, SERUM, ELEVATED 11/15/2010    Carney Living, PT DPT  08/19/2021, 9:11 PM  [facility] [address] [place], [place], [address] Phone: [phone]   Fax:  [phone]  Name: [name] MRN: [id number] Date of Birth: 04-17-1946

## 2021-08-25 ENCOUNTER — Ambulatory Visit (HOSPITAL_BASED_OUTPATIENT_CLINIC_OR_DEPARTMENT_OTHER): Payer: Medicare Other | Admitting: Physical Therapy

## 2021-08-25 ENCOUNTER — Other Ambulatory Visit: Payer: Self-pay

## 2021-08-25 ENCOUNTER — Encounter (HOSPITAL_BASED_OUTPATIENT_CLINIC_OR_DEPARTMENT_OTHER): Payer: Self-pay | Admitting: Physical Therapy

## 2021-08-25 DIAGNOSIS — G8929 Other chronic pain: Secondary | ICD-10-CM | POA: Diagnosis not present

## 2021-08-25 DIAGNOSIS — R2689 Other abnormalities of gait and mobility: Secondary | ICD-10-CM | POA: Diagnosis not present

## 2021-08-25 DIAGNOSIS — M25562 Pain in left knee: Secondary | ICD-10-CM

## 2021-08-25 DIAGNOSIS — M25512 Pain in left shoulder: Secondary | ICD-10-CM | POA: Diagnosis not present

## 2021-08-25 DIAGNOSIS — M25551 Pain in right hip: Secondary | ICD-10-CM

## 2021-08-25 DIAGNOSIS — M6281 Muscle weakness (generalized): Secondary | ICD-10-CM | POA: Diagnosis not present

## 2021-08-26 ENCOUNTER — Encounter (HOSPITAL_BASED_OUTPATIENT_CLINIC_OR_DEPARTMENT_OTHER): Payer: Self-pay | Admitting: Physical Therapy

## 2021-08-26 NOTE — Therapy (Signed)
[facility] [address] [place], [place], [address] Phone: [phone]   Fax:  [phone]  Physical Therapy Treatment  Patient Details  Name: [name] MRN: [id number] Date of Birth: 1946/09/11 Referring Provider (PT): Dr Creola Corn   Encounter Date: 08/25/2021   PT End of Session - 08/25/21 1353     Visit Number 18    Number of Visits 21    Date for PT Re-Evaluation 09/17/21    Authorization Type begin KX 2nd to 15 visit limit reached    PT Start Time 1349    PT Stop Time 1428    PT Time Calculation (min) 39 min    Activity Tolerance Patient tolerated treatment well    Behavior During Therapy [facility] for tasks assessed/performed             Past Medical History:  Diagnosis Date   Arthritis    hands and knees   Complication of anesthesia    aspiration pna; following a colonoscopy, pt requests head of bed elevated if possible.   Constipation    Cough    Depression    Dysrhythmia    RBBB on 06-06-17 ekg    Elevated liver function tests    Esophageal stricture    Family history of breast cancer    Gallstones    Genetic testing 04/02/2018   STAT Breast panel with reflex to Multi-Cancer panel (83 genes) @ [facility] - No pathogenic mutations detected   GERD (gastroesophageal reflux disease)    Hiatal hernia    History of kidney stones    History of radiation therapy 07/09/2018- 08/03/18   Left Breast/ 40.05 Gy in 15 fractions. Left Breast boost 10 Gy in 5 fractions.    Hyperlipemia    Hypothyroidism    Insomnia    Malignant neoplasm of upper-outer quadrant of left female breast ([facility])    Nephrolithiasis    Pneumonia 2015   aspirated after colonosopy   Renal cyst     Past Surgical History:  Procedure Laterality Date   BREAST BIOPSY Left 05/31/2019   mri   BREAST BIOPSY Left 04/2019   malignant   BREAST LUMPECTOMY Left 04/17/2018   malignant   BREAST LUMPECTOMY WITH RADIOACTIVE SEED AND SENTINEL LYMPH  NODE BIOPSY Left 04/17/2018   Procedure: LEFT BREAST LUMPECTOMY WITH BRACKETED RADIOACTIVE SEEDS AND SENTINEL LYMPH NODE BIOPSY;  Surgeon: Emelia Loron, MD;  Location: [facility];  Service: General;  Laterality: Left;   BREAST SURGERY Left    Lumpectomy   CARDIOVASCULAR STRESS TEST  07/19/2006   EF 70%, NO EVIDENCE OF ISCHEMIA   CHOLECYSTECTOMY  12/30/10   COLONOSCOPY     DILATION AND CURETTAGE OF UTERUS     HYSTEROSCOPY WITH D & C N/A 06/07/2018   Procedure: DILATATION AND CURETTAGE /HYSTEROSCOPY;  Surgeon: Marcelle Overlie, MD;  Location: [facility];  Service: Gynecology;  Laterality: N/A;   inguinal herniography     NASAL SINUS SURGERY     RADIOACTIVE SEED GUIDED EXCISIONAL BREAST BIOPSY Right 04/17/2018   Procedure: RIGHT BREAST SEED GUIDED EXCISIONAL BIOPSY;  Surgeon: Emelia Loron, MD;  Location: [facility];  Service: General;  Laterality: Right;   RE-EXCISION OF BREAST LUMPECTOMY Left 05/15/2018   Procedure: RE-EXCISION OF LEFT BREAST LUMPECTOMY, ASPIRATION OF LEFT AXILLARY SEROMA;  Surgeon: Emelia Loron, MD;  Location: [facility];  Service: General;  Laterality: Left;   T & A     TOOTH EXTRACTION  TOTAL KNEE ARTHROPLASTY Left 09/11/2017   Procedure: LEFT TOTAL KNEE ARTHROPLASTY;  Surgeon: Gaynelle Arabian, MD;  Location: [facility];  Service: Orthopedics;  Laterality: Left;  with block   US ECHOCARDIOGRAPHY  07/17/2006   EF 55-60%    There were no vitals filed for this visit.   Subjective Assessment - 08/25/21 1351     Subjective Patient reports that doing gym exercises has hurt her shoulder. She continues to have hip pain as well=.    Pertinent History Patient was diagnosed on 03/08/18 with left grade 1-2 invasive ductal carcinoma breast cancer.It measured 1.5 cm and is located in the upper outer quadrant. It is ER/PR positive and HER2 negative with a Ki67 of < 1%.  She had a left total knee replacement in 10/18 with Dr. Wynelle Link and  continues to have pain in her left lateral knee. Her orthopedist reported it is likely due to her iliotibial band. Patient reports she underwent a left lumpectomy and SNLB (0/4 nodes positive) removed on 04/17/18.She completed radiation, but no chemo    Limitations Standing;Walking    How long can you stand comfortably? limited time before she has pain    How long can you walk comfortably? depends on the day    Diagnostic tests MRI of lumbar spine: L4 disc buldge with possible L5 nerve root impingement    Patient Stated Goals to get rid of the left armpit and arm pain    Currently in Pain? Yes    Pain Score 5     Pain Location Back    Pain Orientation Right    Pain Descriptors / Indicators Aching    Pain Type Chronic pain    Pain Onset More than a month ago    Pain Frequency Constant    Aggravating Factors  standing and walking    Pain Relieving Factors rest    Effect of Pain on Daily Activities difficulty perfroming ADL's    Multiple Pain Sites No                               [facility] Adult PT Treatment/Exercise - 08/26/21 0001       Lumbar Exercises: Stretches   Lower Trunk Rotation Other (comment)   x20   Lower Trunk Rotation Limitations x20      Knee/Hip Exercises: Supine   Other Supine Knee/Hip Exercises clamshell green  3x10; supine march with core breathing 2x15 bilateral;    Other Supine Knee/Hip Exercises quad set 3x10 5 sec hold SLR 2x10;      Shoulder Exercises: Seated   Other Seated Exercises bilateral er in low range 2x10 red; horizontal abduction 2x10 red 2x10 shoulder flexion      Shoulder Exercises: Standing   Other Standing Exercises seated ball roll out 2x10 5 sec hold; lateral ball roll out 2x5 5 sec hold bilateral , ball press 3x10 with cuing for breathing                     PT Education - 08/25/21 1353     Education Details HEP and symptom management    Person(s) Educated Patient    Methods Explanation;Demonstration;Tactile  cues;Verbal cues    Comprehension Verbalized understanding;Returned demonstration;Verbal cues required;Tactile cues required              PT Short Term Goals - 08/06/21 1628       PT SHORT TERM GOAL #3   Title  Pateitn will improve left single leg stance time to 10 sec with min UE assist    Baseline not tested today    Time 3    Period Weeks    Status On-going      PT SHORT TERM GOAL #4   Title Patiewnt will increase gross bilateral LE strength to 4+/5    Baseline gross bilateral strength 4+/5    Time 3    Period Weeks    Status Achieved    Target Date 08/06/21      PT SHORT TERM GOAL #5   Title Patient will be independent with basic HEP for Left LE strengthening and right hip stretching and strengthening    Baseline therapy continues to work on developing full program    Time 3    Period Weeks    Status On-going    Target Date 09/17/21      PT SHORT TERM GOAL #6   Title Patient will decrease 5x sit to stand test by 5 seconds    Time 3    Period Weeks    Status New    Target Date 08/27/21               PT Long Term Goals - 08/06/21 1630       PT LONG TERM GOAL #1   Title Patient will ambualte 1500' with 6 min walk test in order to improve ability to ambualte int he community    Time 4    Period Weeks    Status New    Target Date 09/17/21      PT LONG TERM GOAL #3   Title Pt will report that her overall pain and discomfort is decreased by 50%    Baseline no significant improvement at this time    Time 6    Period Weeks    Status On-going    Target Date 09/17/21      PT LONG TERM GOAL #5   Title Patient will report <3/10 pain with ADL's in her left knee and right hip    Time 6    Period Weeks    Status On-going      PT LONG TERM GOAL #6   Title sit to stand 5 times </= 13 seconds due to improve strength and endurance    Time 6    Period Weeks    Status New                   Plan - 08/25/21 2117     Clinical Impression Statement  Patient has had some pain with gym exercises. Therapy scaled back treatment and reviewed basic exercises. She tolerated theses exercises well. therapy advised her that she always has her base exercises to fall back on if she wants to continue working, but needs to give her body a rest.    Personal Factors and Comorbidities Comorbidity 3+    Comorbidities Previous Lt TKR with persistent pain, Previous Lt breast lumpectomy and radiation, right sciatic pain    Stability/Clinical Decision Making Evolving/Moderate complexity    Clinical Decision Making Moderate    Rehab Potential Good    Clinical Impairments Affecting Rehab Potential pain in left leg, pain in right leg, decreased tolerance of gait due to pain,    PT Frequency 1x / week    PT Treatment/Interventions ADLs/Self Care Home Management;Therapeutic activities;Therapeutic exercise;Patient/family education;Manual techniques;Manual lymph drainage;Scar mobilization;Passive range of motion;Moist Heat;Balance training;Functional mobility training;Orthotic Fit/Training;Taping;Compression bandaging;Aquatic Therapy;Cryotherapy;Neuromuscular re-education;Dry needling  PT Home Exercise Plan Access Code: [email or web address]  URL: [email or web address]  Date: 07/07/2021  Prepared by: Carolyne Littles    Exercises  Supine Lower Trunk Rotation - 1 x daily - 7 x weekly - 3 sets - 10 reps  Hooklying Single Knee to Chest Stretch - 1 x daily - 7 x weekly - 1 sets - 3 reps - 30 hold  Seated Hamstring Stretch - 1 x daily - 7 x weekly - 3 sets - 3 reps - 20 hold  Supine Piriformis Stretch with Foot on Ground - 1 x daily - 7 x weekly - 3 sets - 3 reps - 20 hold  Supine Quad Set - 1 x daily - 7 x weekly - 3 sets - 10 reps  Supine March - 1 x daily - 7 x weekly - 3 sets - 10 reps  Seated Gastroc Stretch with Strap - 1 x daily - 7 x weekly - 3 sets - 3 reps - 20sec hold  Supine Posterior Pelvic Tilt - 1 x daily - 7 x weekly - 3 sets - 10 reps  Hooklying Clamshell with  Resistance - 1 x daily - 7 x weekly - 3 sets - 10 reps  Seated Hip Abduction with Resistance - 1 x daily - 7 x weekly - 3 sets - 10 reps  Seated March - 1 x daily - 7 x weekly - 3 sets - 10 reps  Ankle and Toe Plantarflexion with Resistance - 1 x daily - 7 x weekly - 3 sets - 10 reps  Ankle Eversion with Resistance - 1 x daily - 7 x weekly - 3 sets - 10 reps    Consulted and Agree with Plan of Care Patient             Patient will benefit from skilled therapeutic intervention in order to improve the following deficits and impairments:  Decreased knowledge of precautions, Impaired UE functional use, Decreased range of motion, Postural dysfunction, Decreased scar mobility, Decreased mobility, Pain, Increased fascial restricitons, Increased edema, Abnormal gait, Difficulty walking, Decreased endurance, Decreased activity tolerance, Impaired perceived functional ability, Decreased knowledge of use of DME, Decreased strength  Visit Diagnosis: Other abnormalities of gait and mobility  Pain in right hip  Chronic pain of left knee     Problem List Patient Active Problem List   Diagnosis Date Noted   Breast mass, right 03/27/2020   Hepatic steatosis 01/22/2020   Genetic testing 04/02/2018   Family history of breast cancer    Malignant neoplasm of upper-outer quadrant of left breast in female, estrogen receptor positive ([place]) 03/16/2018   OA (osteoarthritis) of knee 09/11/2017   Infrapatellar bursitis of right knee 04/29/2014   Pneumonia, aspiration ([place]) 12/28/2012   HYPOTHYROIDISM 11/15/2010   HYPERLIPIDEMIA 11/15/2010   DEPRESSION 11/15/2010   GERD 11/15/2010   CONSTIPATION 11/15/2010   GALLSTONES 11/15/2010   RENAL CYST 11/15/2010   INSOMNIA UNSPECIFIED 11/15/2010   COUGH 11/15/2010   ABDOMINAL PAIN OTHER SPECIFIED SITE 11/15/2010   TRANSAMINASES, SERUM, ELEVATED 11/15/2010    Carney Living, PT 08/26/2021, 1:29 PM  [facility] [address] [place], [place], [address] Phone: [phone]   Fax:  [phone]  Name: ZIKERIA KEOUGH MRN: [id number] Date of Birth: Mar 14, 1946

## 2021-08-27 ENCOUNTER — Other Ambulatory Visit: Payer: Self-pay | Admitting: Oncology

## 2021-08-27 DIAGNOSIS — L821 Other seborrheic keratosis: Secondary | ICD-10-CM | POA: Diagnosis not present

## 2021-08-27 DIAGNOSIS — Z853 Personal history of malignant neoplasm of breast: Secondary | ICD-10-CM

## 2021-08-27 DIAGNOSIS — Z08 Encounter for follow-up examination after completed treatment for malignant neoplasm: Secondary | ICD-10-CM | POA: Diagnosis not present

## 2021-08-27 DIAGNOSIS — L570 Actinic keratosis: Secondary | ICD-10-CM | POA: Diagnosis not present

## 2021-08-27 DIAGNOSIS — Z86007 Personal history of in-situ neoplasm of skin: Secondary | ICD-10-CM | POA: Diagnosis not present

## 2021-08-27 DIAGNOSIS — L814 Other melanin hyperpigmentation: Secondary | ICD-10-CM | POA: Diagnosis not present

## 2021-09-01 ENCOUNTER — Other Ambulatory Visit: Payer: Self-pay

## 2021-09-01 ENCOUNTER — Ambulatory Visit (HOSPITAL_BASED_OUTPATIENT_CLINIC_OR_DEPARTMENT_OTHER): Payer: Medicare Other | Admitting: Physical Therapy

## 2021-09-01 ENCOUNTER — Encounter (HOSPITAL_BASED_OUTPATIENT_CLINIC_OR_DEPARTMENT_OTHER): Payer: Self-pay | Admitting: Physical Therapy

## 2021-09-01 DIAGNOSIS — M25512 Pain in left shoulder: Secondary | ICD-10-CM

## 2021-09-01 DIAGNOSIS — R2689 Other abnormalities of gait and mobility: Secondary | ICD-10-CM | POA: Diagnosis not present

## 2021-09-01 DIAGNOSIS — M25551 Pain in right hip: Secondary | ICD-10-CM | POA: Diagnosis not present

## 2021-09-01 DIAGNOSIS — L599 Disorder of the skin and subcutaneous tissue related to radiation, unspecified: Secondary | ICD-10-CM

## 2021-09-01 DIAGNOSIS — G8929 Other chronic pain: Secondary | ICD-10-CM | POA: Diagnosis not present

## 2021-09-01 DIAGNOSIS — M25562 Pain in left knee: Secondary | ICD-10-CM | POA: Diagnosis not present

## 2021-09-01 DIAGNOSIS — M6281 Muscle weakness (generalized): Secondary | ICD-10-CM

## 2021-09-01 NOTE — Therapy (Signed)
[facility] [address] [place], [place], [address] Phone: [phone]   Fax:  [phone]  Physical Therapy Treatment  Patient Details  Name: [name] MRN: [id number] Date of Birth: 18-Sep-1946 Referring Provider (PT): Dr Creola Corn   Encounter Date: 09/01/2021   PT End of Session - 09/01/21 2053     Visit Number 19    Number of Visits 21    Date for PT Re-Evaluation 09/17/21    Authorization Type begin KX 2nd to 15 visit limit reached    PT Start Time 1345    PT Stop Time 1428    PT Time Calculation (min) 43 min    Activity Tolerance Patient tolerated treatment well    Behavior During Therapy [facility] for tasks assessed/performed             Past Medical History:  Diagnosis Date   Arthritis    hands and knees   Complication of anesthesia    aspiration pna; following a colonoscopy, pt requests head of bed elevated if possible.   Constipation    Cough    Depression    Dysrhythmia    RBBB on 06-06-17 ekg    Elevated liver function tests    Esophageal stricture    Family history of breast cancer    Gallstones    Genetic testing 04/02/2018   STAT Breast panel with reflex to Multi-Cancer panel (83 genes) @ [facility] - No pathogenic mutations detected   GERD (gastroesophageal reflux disease)    Hiatal hernia    History of kidney stones    History of radiation therapy 07/09/2018- 08/03/18   Left Breast/ 40.05 Gy in 15 fractions. Left Breast boost 10 Gy in 5 fractions.    Hyperlipemia    Hypothyroidism    Insomnia    Malignant neoplasm of upper-outer quadrant of left female breast (HCC)    Nephrolithiasis    Pneumonia 2015   aspirated after colonosopy   Renal cyst     Past Surgical History:  Procedure Laterality Date   BREAST BIOPSY Left 05/31/2019   mri   BREAST BIOPSY Left 04/2019   malignant   BREAST LUMPECTOMY Left 04/17/2018   malignant   BREAST LUMPECTOMY WITH RADIOACTIVE SEED AND SENTINEL LYMPH  NODE BIOPSY Left 04/17/2018   Procedure: LEFT BREAST LUMPECTOMY WITH BRACKETED RADIOACTIVE SEEDS AND SENTINEL LYMPH NODE BIOPSY;  Surgeon: Emelia Loron, MD;  Location: [facility];  Service: General;  Laterality: Left;   BREAST SURGERY Left    Lumpectomy   CARDIOVASCULAR STRESS TEST  07/19/2006   EF 70%, NO EVIDENCE OF ISCHEMIA   CHOLECYSTECTOMY  12/30/10   COLONOSCOPY     DILATION AND CURETTAGE OF UTERUS     HYSTEROSCOPY WITH D & C N/A 06/07/2018   Procedure: DILATATION AND CURETTAGE /HYSTEROSCOPY;  Surgeon: Marcelle Overlie, MD;  Location: [facility];  Service: Gynecology;  Laterality: N/A;   inguinal herniography     NASAL SINUS SURGERY     RADIOACTIVE SEED GUIDED EXCISIONAL BREAST BIOPSY Right 04/17/2018   Procedure: RIGHT BREAST SEED GUIDED EXCISIONAL BIOPSY;  Surgeon: Emelia Loron, MD;  Location: [facility];  Service: General;  Laterality: Right;   RE-EXCISION OF BREAST LUMPECTOMY Left 05/15/2018   Procedure: RE-EXCISION OF LEFT BREAST LUMPECTOMY, ASPIRATION OF LEFT AXILLARY SEROMA;  Surgeon: Emelia Loron, MD;  Location: [facility];  Service: General;  Laterality: Left;   T & A     TOOTH EXTRACTION  TOTAL KNEE ARTHROPLASTY Left 09/11/2017   Procedure: LEFT TOTAL KNEE ARTHROPLASTY;  Surgeon: Gaynelle Arabian, MD;  Location: [facility];  Service: Orthopedics;  Laterality: Left;  with block   US ECHOCARDIOGRAPHY  07/17/2006   EF 55-60%    There were no vitals filed for this visit.   Subjective Assessment - 09/01/21 2043     Subjective Patient reports she has had some tightness in her neck and upper trap over the past few days. She walked for an hour in the pool yesterday and woke up in significant pain. She used her thera-gun and had improved pain. It is not feeling as bad now. Therapy reviewed upper back/trap levator stretching as well as low back exercises with the ball.    Pertinent History Patient was diagnosed on 03/08/18 with left grade  1-2 invasive ductal carcinoma breast cancer.It measured 1.5 cm and is located in the upper outer quadrant. It is ER/PR positive and HER2 negative with a Ki67 of < 1%.  She had a left total knee replacement in 10/18 with Dr. Wynelle Link and continues to have pain in her left lateral knee. Her orthopedist reported it is likely due to her iliotibial band. Patient reports she underwent a left lumpectomy and SNLB (0/4 nodes positive) removed on 04/17/18.She completed radiation, but no chemo    Limitations Standing;Walking    How long can you stand comfortably? limited time before she has pain    How long can you walk comfortably? depends on the day    Diagnostic tests MRI of lumbar spine: L4 disc buldge with possible L5 nerve root impingement    Patient Stated Goals to get rid of the left armpit and arm pain    Currently in Pain? Yes    Pain Score 5     Pain Location Back    Pain Orientation Right    Pain Descriptors / Indicators Aching    Pain Type Chronic pain    Pain Onset More than a month ago    Pain Frequency Constant    Aggravating Factors  standing and walkng    Pain Relieving Factors rest    Effect of Pain on Daily Activities difficulty perfroming ADL's                               [facility] Adult PT Treatment/Exercise - 09/01/21 0001       Knee/Hip Exercises: Supine   Other Supine Knee/Hip Exercises double knee to chest 2x10; heel press into the ball 2x10; lateral rotation 2x10;      Shoulder Exercises: Stretch   Other Shoulder Stretches 3x20 sec hold upper trap; levator stretch 3x20 sec hold      Manual Therapy   Soft tissue mobilization trigger point release to pirformis and gluteal; roller to gluteal                     PT Education - 09/01/21 2052     Education Details reviewed technique with ball exercises    Person(s) Educated Patient    Methods Explanation;Demonstration;Tactile cues;Verbal cues    Comprehension Verbalized understanding;Returned  demonstration;Verbal cues required;Tactile cues required              PT Short Term Goals - 08/06/21 1628       PT SHORT TERM GOAL #3   Title Pateitn will improve left single leg stance time to 10 sec with min UE assist  Baseline not tested today    Time 3    Period Weeks    Status On-going      PT SHORT TERM GOAL #4   Title Patiewnt will increase gross bilateral LE strength to 4+/5    Baseline gross bilateral strength 4+/5    Time 3    Period Weeks    Status Achieved    Target Date 08/06/21      PT SHORT TERM GOAL #5   Title Patient will be independent with basic HEP for Left LE strengthening and right hip stretching and strengthening    Baseline therapy continues to work on developing full program    Time 3    Period Weeks    Status On-going    Target Date 09/17/21      PT SHORT TERM GOAL #6   Title Patient will decrease 5x sit to stand test by 5 seconds    Time 3    Period Weeks    Status New    Target Date 08/27/21               PT Long Term Goals - 08/06/21 1630       PT LONG TERM GOAL #1   Title Patient will ambualte 1500' with 6 min walk test in order to improve ability to ambualte int he community    Time 4    Period Weeks    Status New    Target Date 09/17/21      PT LONG TERM GOAL #3   Title Pt will report that her overall pain and discomfort is decreased by 50%    Baseline no significant improvement at this time    Time 6    Period Weeks    Status On-going    Target Date 09/17/21      PT LONG TERM GOAL #5   Title Patient will report <3/10 pain with ADL's in her left knee and right hip    Time 6    Period Weeks    Status On-going      PT LONG TERM GOAL #6   Title sit to stand 5 times </= 13 seconds due to improve strength and endurance    Time 6    Period Weeks    Status New                   Plan - 09/01/21 1427     Clinical Impression Statement Patient was given upper trap / upper back stretches to help with  osture. She was also given decompression exercises using the ball. Therapy reviewed breathing and decmpression position for home. Therapy perfroemd trigger point release on her hip and lower back.    Personal Factors and Comorbidities Comorbidity 3+    Comorbidities Previous Lt TKR with persistent pain, Previous Lt breast lumpectomy and radiation, right sciatic pain    Examination-Activity Limitations Carry;Lift    Stability/Clinical Decision Making Evolving/Moderate complexity    Clinical Decision Making Moderate    Rehab Potential Good    Clinical Impairments Affecting Rehab Potential pain in left leg, pain in right leg, decreased tolerance of gait due to pain,    PT Frequency 1x / week    PT Duration 6 weeks    PT Treatment/Interventions ADLs/Self Care Home Management;Therapeutic activities;Therapeutic exercise;Patient/family education;Manual techniques;Manual lymph drainage;Scar mobilization;Passive range of motion;Moist Heat;Balance training;Functional mobility training;Orthotic Fit/Training;Taping;Compression bandaging;Aquatic Therapy;Cryotherapy;Neuromuscular re-education;Dry needling    PT Next Visit Plan manual therapy and review exercises.  PT Home Exercise Plan Access Code: [email or web address]  URL: [email or web address]  Date: 07/07/2021  Prepared by: Carolyne Littles    Exercises  Supine Lower Trunk Rotation - 1 x daily - 7 x weekly - 3 sets - 10 reps  Hooklying Single Knee to Chest Stretch - 1 x daily - 7 x weekly - 1 sets - 3 reps - 30 hold  Seated Hamstring Stretch - 1 x daily - 7 x weekly - 3 sets - 3 reps - 20 hold  Supine Piriformis Stretch with Foot on Ground - 1 x daily - 7 x weekly - 3 sets - 3 reps - 20 hold  Supine Quad Set - 1 x daily - 7 x weekly - 3 sets - 10 reps  Supine March - 1 x daily - 7 x weekly - 3 sets - 10 reps  Seated Gastroc Stretch with Strap - 1 x daily - 7 x weekly - 3 sets - 3 reps - 20sec hold  Supine Posterior Pelvic Tilt - 1 x daily - 7 x weekly - 3 sets -  10 reps  Hooklying Clamshell with Resistance - 1 x daily - 7 x weekly - 3 sets - 10 reps  Seated Hip Abduction with Resistance - 1 x daily - 7 x weekly - 3 sets - 10 reps  Seated March - 1 x daily - 7 x weekly - 3 sets - 10 reps  Ankle and Toe Plantarflexion with Resistance - 1 x daily - 7 x weekly - 3 sets - 10 reps  Ankle Eversion with Resistance - 1 x daily - 7 x weekly - 3 sets - 10 reps    Consulted and Agree with Plan of Care Patient             Patient will benefit from skilled therapeutic intervention in order to improve the following deficits and impairments:  Decreased knowledge of precautions, Impaired UE functional use, Decreased range of motion, Postural dysfunction, Decreased scar mobility, Decreased mobility, Pain, Increased fascial restricitons, Increased edema, Abnormal gait, Difficulty walking, Decreased endurance, Decreased activity tolerance, Impaired perceived functional ability, Decreased knowledge of use of DME, Decreased strength  Visit Diagnosis: Other abnormalities of gait and mobility  Pain in right hip  Chronic pain of left knee  Chronic left shoulder pain  Muscle weakness (generalized)  Disorder of the skin and subcutaneous tissue related to radiation, unspecified     Problem List Patient Active Problem List   Diagnosis Date Noted   Breast mass, right 03/27/2020   Hepatic steatosis 01/22/2020   Genetic testing 04/02/2018   Family history of breast cancer    Malignant neoplasm of upper-outer quadrant of left breast in female, estrogen receptor positive ([place]) 03/16/2018   OA (osteoarthritis) of knee 09/11/2017   Infrapatellar bursitis of right knee 04/29/2014   Pneumonia, aspiration ([place]) 12/28/2012   HYPOTHYROIDISM 11/15/2010   HYPERLIPIDEMIA 11/15/2010   DEPRESSION 11/15/2010   GERD 11/15/2010   CONSTIPATION 11/15/2010   GALLSTONES 11/15/2010   RENAL CYST 11/15/2010   INSOMNIA UNSPECIFIED 11/15/2010   COUGH 11/15/2010   ABDOMINAL PAIN OTHER  SPECIFIED SITE 11/15/2010   TRANSAMINASES, SERUM, ELEVATED 11/15/2010    Carney Living, PT 09/01/2021, 8:59 PM  [facility] [address] [place], [place], [address] Phone: [phone]   Fax:  [phone]  Name: [name] MRN: [id number] Date of Birth: 1946/03/25

## 2021-09-02 ENCOUNTER — Inpatient Hospital Stay: Payer: Medicare Other | Attending: Oncology

## 2021-09-02 ENCOUNTER — Inpatient Hospital Stay (HOSPITAL_BASED_OUTPATIENT_CLINIC_OR_DEPARTMENT_OTHER): Payer: Medicare Other | Admitting: Oncology

## 2021-09-02 VITALS — BP 138/72 | HR 86 | Temp 97.3°F | Resp 18 | Ht 68.0 in | Wt 160.1 lb

## 2021-09-02 DIAGNOSIS — Z79899 Other long term (current) drug therapy: Secondary | ICD-10-CM | POA: Insufficient documentation

## 2021-09-02 DIAGNOSIS — Z79811 Long term (current) use of aromatase inhibitors: Secondary | ICD-10-CM | POA: Insufficient documentation

## 2021-09-02 DIAGNOSIS — C50412 Malignant neoplasm of upper-outer quadrant of left female breast: Secondary | ICD-10-CM

## 2021-09-02 DIAGNOSIS — Z170 Estrogen receptor positive status [ER+]: Secondary | ICD-10-CM

## 2021-09-02 LAB — COMPREHENSIVE METABOLIC PANEL
ALT: 31 U/L (ref 0–44)
AST: 39 U/L (ref 15–41)
Albumin: 4.2 g/dL (ref 3.5–5.0)
Alkaline Phosphatase: 101 U/L (ref 38–126)
Anion gap: 8 (ref 5–15)
BUN: 15 mg/dL (ref 8–23)
CO2: 26 mmol/L (ref 22–32)
Calcium: 10 mg/dL (ref 8.9–10.3)
Chloride: 107 mmol/L (ref 98–111)
Creatinine, Ser: 0.76 mg/dL (ref 0.44–1.00)
GFR, Estimated: >60 mL/min (ref >60)
Glucose, Bld: 110 mg/dL — ABNORMAL HIGH (ref 70–99)
Potassium: 4.4 mmol/L (ref 3.5–5.1)
Sodium: 141 mmol/L (ref 135–145)
Total Bilirubin: 0.6 mg/dL (ref 0.3–1.2)
Total Protein: 6.6 g/dL (ref 6.5–8.1)

## 2021-09-02 LAB — CBC WITH DIFFERENTIAL/PLATELET
Abs Immature Granulocytes: 0.01 10*3/uL (ref 0.00–0.07)
Basophils Absolute: 0.1 10*3/uL (ref 0.0–0.1)
Basophils Relative: 1 %
Eosinophils Absolute: 0.4 10*3/uL (ref 0.0–0.5)
Eosinophils Relative: 6 %
HCT: 38.3 % (ref 36.0–46.0)
Hemoglobin: 12.7 g/dL (ref 12.0–15.0)
Immature Granulocytes: 0 %
Lymphocytes Relative: 43 %
Lymphs Abs: 2.9 10*3/uL (ref 0.7–4.0)
MCH: 29.2 pg (ref 26.0–34.0)
MCHC: 33.2 g/dL (ref 30.0–36.0)
MCV: 88 fL (ref 80.0–100.0)
Monocytes Absolute: 0.6 10*3/uL (ref 0.1–1.0)
Monocytes Relative: 8 %
Neutro Abs: 3 10*3/uL (ref 1.7–7.7)
Neutrophils Relative %: 42 %
Platelets: 211 10*3/uL (ref 150–400)
RBC: 4.35 MIL/uL (ref 3.87–5.11)
RDW: 13.8 % (ref 11.5–15.5)
WBC: 7 10*3/uL (ref 4.0–10.5)
nRBC: 0 % (ref 0.0–0.2)

## 2021-09-02 NOTE — Progress Notes (Addendum)
[place]  [phone] Fax:(336) [phone]    ID: MIALYNN SHELVIN DOB: 09/23/1946  MR#: [id number]  BXU#:[id number]  Patient Care Team: Shon Baton, MD as PCP - General Rolm Bookbinder, MD as Consulting Physician (General Surgery) Magrinat, Virgie Dad, MD as Consulting Physician (Oncology) Eppie Gibson, MD as Attending Physician (Radiation Oncology) Lelon Perla, MD as Consulting Physician (Cardiology) [name], MD as Consulting Physician (Obstetrics and Gynecology) Jacquelynn Cree, PT as Physical Therapist (Physical Therapy) Donzetta Sprung., MD as Referring Physician (Sports Medicine) OTHER MD:   CHIEF COMPLAINT: Estrogen receptor positive breast cancer  CURRENT TREATMENT: Anastrozole every Monday Wednesday and Friday   INTERVAL HISTORY: [name] returns today for follow-up of her estrogen receptor positive breast cancer.    She was switched to anastrozole, started on 03/15/2021.   After about 2 months she developed arthralgias and myalgias and more cognitive issues so we stopped in June.  Since stopping she thinks she is a little bit better on the cognitive side and she says her husband likes her more off the medication (because she remembers things better).  However she still has significant problems with her left knee, and with her right sciatica.    She then resume the anastrozole in August.  She is not having hot flashes but she says she is having more nightmares.  She thinks the cognitive problem is worse  Her most recent bone density screening from 02/18/2021 showed a T-score of -1.8, which is considered osteopenic.   REVIEW OF SYSTEMS: [name] is getting physical therapy for her knee and also walks when her husband can go with her.  She feels very concerned about possibly falling.  She has problems with tinnitus.  She is going to see a physician at [facility] in May 2023 regarding the knee problem.  A detailed review of systems was  otherwise stable   COVID 19 VACCINATION STATUS: Status post Moderna x2, most recently February 2021, with booster October 2021    HISTORY OF CURRENT ILLNESS: From the original intake note:  "[name]" had routine diagnostic mammography on 03/08/2018 showing breast density category D.  There was a 1.5 cm irregular mass in the left upper outer quadrant posterior depth.  This was further imaged with ultrasonography on the same day.  The left axilla was sonographically benign.  No abnormalities were seen in the left axilla.  Accordingly on 03/08/2018 she proceeded to biopsy of the left breast area in question. The pathology from this procedure showed ([id number]): Invasive ductal carcinoma, grade I-II. HER-2 negative with the ratio being 1.61 and number per cell 3.30.  The tumor was estrogen receptor 100% positive and progesterone receptor 100% positive, both with strong staining intensity.  Ki67 was <1%.   On 03/18/2018, she underwent a bilateral breast MRI with and without contrast, showing the mass in question in a setting of 3.3 cm non-masslike enhancement in the posterior aspect of the left upper-outer quadrant. in addition there was a 1.1 cm mass in the left upper-outer quadrant located 2.1 cm anterior and lateral to the biopsy proven malignant.   Right breast showed two indeterminate 5 mm adjacent enhancing nodules in the upper-inner quadrant.   The patient's subsequent history is as detailed below.   PAST MEDICAL HISTORY: Past Medical History:  Diagnosis Date   Arthritis    hands and knees   Complication of anesthesia    aspiration pna; following a colonoscopy, pt requests head of bed elevated if possible.   Constipation  Cough    Depression    Dysrhythmia    RBBB on 06-06-17 ekg    Elevated liver function tests    Esophageal stricture    Family history of breast cancer    Gallstones    Genetic testing 04/02/2018   STAT Breast panel with reflex to Multi-Cancer panel (83 genes) @  [facility] - No pathogenic mutations detected   GERD (gastroesophageal reflux disease)    Hiatal hernia    History of kidney stones    History of radiation therapy 07/09/2018- 08/03/18   Left Breast/ 40.05 Gy in 15 fractions. Left Breast boost 10 Gy in 5 fractions.    Hyperlipemia    Hypothyroidism    Insomnia    Malignant neoplasm of upper-outer quadrant of left female breast ([place])    Nephrolithiasis    Pneumonia 2015   aspirated after colonosopy   Renal cyst     PAST SURGICAL HISTORY: Past Surgical History:  Procedure Laterality Date   BREAST BIOPSY Left 05/31/2019   mri   BREAST BIOPSY Left 04/2019   malignant   BREAST LUMPECTOMY Left 04/17/2018   malignant   BREAST LUMPECTOMY WITH RADIOACTIVE SEED AND SENTINEL LYMPH NODE BIOPSY Left 04/17/2018   Procedure: LEFT BREAST LUMPECTOMY WITH BRACKETED RADIOACTIVE SEEDS AND SENTINEL LYMPH NODE BIOPSY;  Surgeon: Rolm Bookbinder, MD;  Location: [place];  Service: General;  Laterality: Left;   BREAST SURGERY Left    Lumpectomy   CARDIOVASCULAR STRESS TEST  07/19/2006   EF 70%, NO EVIDENCE OF ISCHEMIA   CHOLECYSTECTOMY  12/30/10   COLONOSCOPY     DILATION AND CURETTAGE OF UTERUS     HYSTEROSCOPY WITH D & C N/A 06/07/2018   Procedure: DILATATION AND CURETTAGE /HYSTEROSCOPY;  Surgeon: [name], MD;  Location: [facility];  Service: Gynecology;  Laterality: N/A;   inguinal herniography     NASAL SINUS SURGERY     RADIOACTIVE SEED GUIDED EXCISIONAL BREAST BIOPSY Right 04/17/2018   Procedure: RIGHT BREAST SEED GUIDED EXCISIONAL BIOPSY;  Surgeon: Rolm Bookbinder, MD;  Location: [facility];  Service: General;  Laterality: Right;   RE-EXCISION OF BREAST LUMPECTOMY Left 05/15/2018   Procedure: RE-EXCISION OF LEFT BREAST LUMPECTOMY, ASPIRATION OF LEFT AXILLARY SEROMA;  Surgeon: Rolm Bookbinder, MD;  Location: [facility];  Service: General;  Laterality: Left;   T & A     TOOTH EXTRACTION     TOTAL  KNEE ARTHROPLASTY Left 09/11/2017   Procedure: LEFT TOTAL KNEE ARTHROPLASTY;  Surgeon: Gaynelle Arabian, MD;  Location: [facility];  Service: Orthopedics;  Laterality: Left;  with block   US ECHOCARDIOGRAPHY  07/17/2006   EF 55-60%    FAMILY HISTORY Family History  Problem Relation Age of Onset   Breast cancer Mother 6       deceased 95; TAH/BSO in 42s/50s   Dementia Father        deceased 29   Breast cancer Maternal Aunt 82       deceased 51s   Colon cancer Neg Hx    Stomach cancer Neg Hx   Her father died of old age at 17 years old. Her mother had breast cancer at 95 and died at 75 years old. She will undergo genetic testing. Her maternal aunt had breast cancer at 75 years old. No family history of ovarian cancer. She had one brother and one sister. The patient's sister Marcelina Morel is also my patient   GYNECOLOGIC HISTORY:  No LMP recorded. Patient is  postmenopausal. Menarche: 75 years old [place] P 0 LMP: 1997 Contraceptive: 2 years HRT: Estrogen and progesterone for about 7 years  Hysterectomy? No   SOCIAL HISTORY:  She is a retired Futures trader. She has been married to [place], for 45 plus years. He worked in the [employer]. Together they adopted two children. One daughter, [name], 38 of [place], who is a Psychologist, sport and exercise for an OBGYN, and one son, Carlee Vonderhaar, 36 of [place], who owns a Marketing executive business. The patient has three grandchildren. She attends [employer].    ADVANCED DIRECTIVES: In place   HEALTH MAINTENANCE: Social History   Tobacco Use   Smoking status: Never   Smokeless tobacco: Never  Vaping Use   Vaping Use: Never used  Substance Use Topics   Alcohol use: No   Drug use: No    Colonoscopy: 2014/ [facility]  PAP: 2018  Bone density:  May 2014 at [facility] showed a T score of -0.9 normal   Allergies  Allergen Reactions   Oxycontin [Oxycodone Hcl]     "it made me feel very high and hyper"    Codeine Nausea And Vomiting     Current Outpatient Medications  Medication Sig Dispense Refill   acetaminophen (TYLENOL) 500 MG tablet Take 500 mg by mouth every 6 (six) hours as needed for mild pain or headache. (Patient not taking: Reported on 02/17/2021)     azelastine (ASTELIN) 0.1 % nasal spray Place 1 spray into both nostrils 2 (two) times daily. Use in each nostril as directed     Cholecalciferol (VITAMIN D3) 1000 units CAPS Take 1 capsule by mouth 2 (two) times daily.      escitalopram (LEXAPRO) 10 MG tablet Take 15 mg by mouth daily.     esomeprazole (NEXIUM) 40 MG capsule Take 40 mg by mouth 2 (two) times daily before a meal.     ezetimibe (ZETIA) 10 MG tablet Take 10 mg by mouth at bedtime.     fluticasone (FLONASE) 50 MCG/ACT nasal spray Place 1 spray into both nostrils daily.     levothyroxine (SYNTHROID, LEVOTHROID) 50 MCG tablet Take 50 mcg by mouth at bedtime.     Magnesium 250 MG TABS Take 1 tablet by mouth daily.      naproxen sodium (ALEVE) 220 MG tablet Take 220 mg by mouth 2 (two) times daily as needed (pain).     OVER THE COUNTER MEDICATION Apply 1 application topically daily as needed (pain). Natural pain relief ointment     Probiotic Product (PROBIOTIC ADVANCED PO) Take 1 tablet by mouth daily.     rosuvastatin (CRESTOR) 5 MG tablet Take 5 mg by mouth at bedtime.      sodium chloride (OCEAN) 0.65 % SOLN nasal spray Place 4 sprays into both nostrils 4 (four) times daily.     SUPER B COMPLEX/C PO Take 1 tablet by mouth daily.     zolpidem (AMBIEN) 10 MG tablet zolpidem 10 mg tablet  take 1 tablet by mouth at bedtime if needed     No current facility-administered medications for this visit.    OBJECTIVE: white woman who appears stated age 48:   09/02/21 1242  BP: 138/72  Pulse: 86  Resp: 18  Temp: (!) 97.3 F (36.3 C)  SpO2: 97%     Body mass index is 24.34 kg/m.   Wt Readings from Last 3 Encounters:  09/02/21 160 lb 1.6 oz (72.6 kg)  06/02/21 163 lb 9.6 oz (74.2 kg)  12/24/20 165  lb 3.2 oz (74.9 kg)      ECOG FS:1 - Symptomatic but completely ambulatory  Sclerae unicteric, EOMs intact Wearing a mask No cervical or supraclavicular adenopathy Lungs no rales or rhonchi Heart regular rate and rhythm Abd soft, nontender, positive bowel sounds MSK no focal spinal tenderness, no upper extremity lymphedema Neuro: nonfocal, well oriented, appropriate affect Breasts: The right breast is status postlumpectomy.  The left breast is status postlumpectomy followed by radiation.  I do not feel any masses and there are no skin or nipple changes of concern.  Both axillae are benign.   LAB RESULTS:  CMP     Component Value Date/Time   NA 141 09/02/2021 1231   NA 144 06/06/2017 1448   K 4.4 09/02/2021 1231   CL 107 09/02/2021 1231   CO2 26 09/02/2021 1231   GLUCOSE 110 (H) 09/02/2021 1231   BUN 15 09/02/2021 1231   BUN 12 06/06/2017 1448   CREATININE 0.76 09/02/2021 1231   CREATININE 0.80 11/26/2019 1024   CALCIUM 10.0 09/02/2021 1231   PROT 6.6 09/02/2021 1231   PROT 6.3 06/06/2017 1448   ALBUMIN 4.2 09/02/2021 1231   ALBUMIN 4.4 06/06/2017 1448   AST 39 09/02/2021 1231   AST 45 (H) 11/26/2019 1024   ALT 31 09/02/2021 1231   ALT 31 11/26/2019 1024   ALKPHOS 101 09/02/2021 1231   BILITOT 0.6 09/02/2021 1231   BILITOT 0.7 11/26/2019 1024   GFRNONAA >60 09/02/2021 1231   GFRNONAA >60 11/26/2019 1024   GFRAA >60 06/29/2020 1237   GFRAA >60 11/26/2019 1024    No results found for: TOTALPROTELP, ALBUMINELP, A1GS, A2GS, BETS, BETA2SER, GAMS, MSPIKE, SPEI  No results found for: KPAFRELGTCHN, LAMBDASER, KAPLAMBRATIO  Lab Results  Component Value Date   WBC 7.0 09/02/2021   NEUTROABS 3.0 09/02/2021   HGB 12.7 09/02/2021   HCT 38.3 09/02/2021   MCV 88.0 09/02/2021   PLT 211 09/02/2021   No results found for: LABCA2  No components found for: [id number]  No results for input(s): INR in the last 168 hours.  No results found for: LABCA2  No results found  for: HCW237  No results found for: SEG315  No results found for: VVO160  No results found for: CA2729  No components found for: HGQUANT  No results found for: CEA1 / No results found for: CEA1   No results found for: AFPTUMOR  No results found for: CHROMOGRNA  No results found for: HGBA, HGBA2QUANT, HGBFQUANT, HGBSQUAN (Hemoglobinopathy evaluation)   No results found for: LDH  No results found for: IRON, TIBC, IRONPCTSAT (Iron and TIBC)  No results found for: FERRITIN  Urinalysis    Component Value Date/Time   COLORURINE YELLOW 08/22/2016 1947   APPEARANCEUR CLEAR 08/22/2016 1947   LABSPEC 1.026 08/22/2016 1947   PHURINE 5.5 08/22/2016 1947   GLUCOSEU NEGATIVE 08/22/2016 1947   HGBUR SMALL (A) 08/22/2016 1947   BILIRUBINUR NEGATIVE 08/22/2016 [place] 08/22/2016 1947   PROTEINUR NEGATIVE 08/22/2016 1947   UROBILINOGEN 0.2 08/22/2016 1622   NITRITE NEGATIVE 08/22/2016 1947   LEUKOCYTESUR SMALL (A) 08/22/2016 1947    STUDIES: No results found.   ASSESSMENT: 75 y.o. Veteran woman status post left breast upper outer quadrant biopsy 03/08/2018 for a clinical T1c-T2 N0, stage I invasive ductal carcinoma, grade 1 or 2, estrogen and progesterone receptor positive, HER-2 not amplified, with an MIB-1 of less than 1%  (1) additional biopsies as follows:  (a) right breast  biopsy ([id number]) on 04/02/2018 showed atypical lobular hyperplasia  (b) left breast ([id number]) on 03/22/2018 showed invasive lobular carcinoma, grade 2, estrogen and progesterone receptor positive with an MIB-1 of 5%. HER-2 not amplified  (2) bilateral lumpectomies and left sentinel lymph node sampling 04/17/2018 found  (a) on the right side, atypical lobular hyperplasia  (b) on the left side, an mpT1c pN0, stage IA, grade 2 invasive lobular carcinoma, with positive margins  (c) additional left breast surgery on 05/15/2018 cleared the compromise margins  (3) The Oncotype DX  score was 14, predicting a risk of outside the breast recurrence over the next 9 years of 4% if the patient's only systemic therapy is tamoxifen for 5 years.  It also predicts no significant benefit from chemotherapy.  (4) adjuvant radiation 07/09/2018 - 08/03/2018  Site/dose:    1. Left Breast / 40.05 Gy in 15 fractions 2. Left Breast Boost / 10 Gy in 5 fractions  (5) started tamoxifen 09/11/2018, discontinued June 2020 with cognitive issues   (6) genetics testing 03/22/2018 through [facility]'s STAT Breast panel with reflex to Multi-Cancer panel found no pathogenic mutations in (ALK, APC, ATM, AXIN2, BAP1, BARD1, BLM, BMPR1A, BRCA1, BRCA2, BRIP1, CASR, CDC73, CDH1, CDK4, CDKN1B, CDKN1C, CDKN2A, CEBPA, CHEK2, CTNNA1, DICER1, DIS3L2, EGFR, EPCAM, FH, FLCN, GATA2, GPC3, GREM1, HOXB13, HRAS, KIT, MAX, MEN1, MET, MITF, MLH1, MSH2, MSH3, MSH6, MUTYH, NBN, NF1, NF2, NTHL1, PALB2, PDGFRA, PHOX2B, PMS2, POLD1, POLE, POT1, PRKAR1A, PTCH1, PTEN, RAD50, RAD51C, RAD51D, RB1, RECQL4, RET, RUNX1, SDHA, SDHAF2, SDHB, SDHC, SDHD, SMAD4, SMARCA4, SMARCB1, SMARCE1, STK11, SUFU, TERC, TERT, TMEM127, TP53, TSC1, TSC2, VHL, WRN, WT1).  (a) Variants of Uncertain Significance were found in in PDGFRA and RECQL4.  (7) started letrozole 09/26/2019, discontinued 11/26/2019 with cognitive issues  (a) bone density May 2014 normal, T score -0.9  (b) letrozole resumed 2020-01-22, taken Mondays, Wednesday, Friday, as being off medication made no difference to either hot flashes or cognitive deficiencies  (c) letrozole discontinued October 2021 with multiple side effects  (8) exemestane prescribed 12/24/2020, discontinued mid March 2022 with intolerable side effect  (9) anastrozole started 03/15/2021, held mid June with concerns regarding cognitive dysfunction and severe arthralgias/myalgias, resumed August 2022, with similar symptoms, then decreased to Monday Wednesday Friday only September 2022.   PLAN: [name] is very  motivated to continue the anastrozole.  She does not think she would be able to tolerate fulvestrant because of the sciatica problem.  She was told by a fellow patient that taking anastrozole at 6 PM together with Zyrtec could be helpful.  She is giving that a try.  I am willing to drop the dose.  We could do Monday Wednesday and Friday only.  We do not really have data on the lowest effective dose and this is so many other cancer medications.  Certainly I think it would be better for her to receive some than none  She is encouraged that after 2 years she will have completed her 5 years of antiestrogens.  We now have a psychologist at [facility], Dr. Conception Chancy and I offered her a referral for neuropsychiatric evaluation.  This could serve as a baseline.  At this point though she says she cannot handle anymore complications.  If she changes her mind she will let me know.  Otherwise will have a virtual visit in 8 weeks and if she is tolerating anastrozole better at the lower dose and the plan will be to continue that for an additional 2 years.  Total encounter time 25  minutes.*   Magrinat, Virgie Dad, MD  09/02/21 1:19 PM Medical Oncology and Hematology [facility] [place], [place] [address] Tel. [phone]    Fax. [phone]   I, Wilburn Mylar, am acting as scribe for Dr. Virgie Dad. Magrinat.  I, Lurline Del MD, have reviewed the above documentation for accuracy and completeness, and I agree with the above.   *Total Encounter Time as defined by the Centers for Medicare and Medicaid Services includes, in addition to the face-to-face time of a patient visit (documented in the note above) non-face-to-face time: obtaining and reviewing outside history, ordering and reviewing medications, tests or procedures, care coordination (communications with other health care professionals or caregivers) and documentation in the medical record.

## 2021-09-08 ENCOUNTER — Ambulatory Visit (HOSPITAL_BASED_OUTPATIENT_CLINIC_OR_DEPARTMENT_OTHER): Payer: Medicare Other | Admitting: Physical Therapy

## 2021-09-08 DIAGNOSIS — H40003 Preglaucoma, unspecified, bilateral: Secondary | ICD-10-CM | POA: Diagnosis not present

## 2021-09-15 ENCOUNTER — Ambulatory Visit (HOSPITAL_BASED_OUTPATIENT_CLINIC_OR_DEPARTMENT_OTHER): Payer: Medicare Other | Attending: Internal Medicine | Admitting: Physical Therapy

## 2021-09-15 ENCOUNTER — Other Ambulatory Visit: Payer: Self-pay

## 2021-09-15 ENCOUNTER — Encounter (HOSPITAL_BASED_OUTPATIENT_CLINIC_OR_DEPARTMENT_OTHER): Payer: Self-pay | Admitting: Physical Therapy

## 2021-09-15 DIAGNOSIS — G8929 Other chronic pain: Secondary | ICD-10-CM | POA: Insufficient documentation

## 2021-09-15 DIAGNOSIS — M25551 Pain in right hip: Secondary | ICD-10-CM | POA: Diagnosis not present

## 2021-09-15 DIAGNOSIS — R2689 Other abnormalities of gait and mobility: Secondary | ICD-10-CM | POA: Diagnosis not present

## 2021-09-15 DIAGNOSIS — M25562 Pain in left knee: Secondary | ICD-10-CM | POA: Insufficient documentation

## 2021-09-15 DIAGNOSIS — M25512 Pain in left shoulder: Secondary | ICD-10-CM | POA: Insufficient documentation

## 2021-09-15 DIAGNOSIS — M6281 Muscle weakness (generalized): Secondary | ICD-10-CM | POA: Insufficient documentation

## 2021-09-16 ENCOUNTER — Encounter (HOSPITAL_BASED_OUTPATIENT_CLINIC_OR_DEPARTMENT_OTHER): Payer: Self-pay | Admitting: Physical Therapy
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
## 2021-09-17 ENCOUNTER — Telehealth: Payer: Self-pay | Admitting: Oncology

## 2021-09-17 NOTE — Telephone Encounter (Signed)
Sch per 9/22 los, pt aware

## 2021-09-18 DIAGNOSIS — Z23 Encounter for immunization: Secondary | ICD-10-CM | POA: Diagnosis not present

## 2021-10-12 ENCOUNTER — Encounter: Payer: Medicare Other | Attending: Physical Medicine & Rehabilitation | Admitting: Physical Medicine & Rehabilitation

## 2021-10-12 ENCOUNTER — Encounter: Payer: Self-pay | Admitting: Physical Medicine & Rehabilitation

## 2021-10-12 ENCOUNTER — Other Ambulatory Visit: Payer: Self-pay

## 2021-10-12 VITALS — BP 126/80 | HR 74 | Temp 98.0°F | Ht 68.0 in | Wt 159.4 lb

## 2021-10-12 DIAGNOSIS — M48061 Spinal stenosis, lumbar region without neurogenic claudication: Secondary | ICD-10-CM

## 2021-10-12 DIAGNOSIS — M5416 Radiculopathy, lumbar region: Secondary | ICD-10-CM | POA: Diagnosis not present

## 2021-10-12 DIAGNOSIS — G8928 Other chronic postprocedural pain: Secondary | ICD-10-CM

## 2021-10-12 NOTE — Progress Notes (Signed)
Subjective:    Patient ID: [name], female    DOB: 1946-01-20, 75 y.o.   MRN: [id number]  HPI  CC:  Left knee pain is original referral however patient states that right-sided sciatic pain is the worst currently 75 yo female with hx of breast carcinoma, GERD who was referred for the evaluation of chronic left knee pain following surgery.  Onset years ago , underwent Left TKR 2018 Dr Maureen Ralphs, and had worsening pain post operatively. The patient had excellent range of motion and strength after completing her postoperative rehabilitation program.  She has undergone reevaluation both by Dr. Maureen Ralphs as well as by Dr. Ninfa Linden.   Step climbing up causes increased pain  Takes anti inflammatory medications on a daily basis  RIght knee has some pain but not severe  Right sided sciatic pain has been ongoing for 2.5 yrs, relieved temporarily by [facility] per Dr Brien Few ~ 95mo ago, the patient's husband thinks it may have been at the L4-L5 level but does not know for sure.  The patient does not have any records from that office.  The right lower extremity pain is intermittent but when it occurs is fairly severe.  It does occur on a daily basis Pain Inventory Average Pain 7 Pain Right Now 7 My pain is sharp, burning, stabbing, and aching  In the last 24 hours, has pain interfered with the following? General activity 5 Relation with others 5 Enjoyment of life 10 What TIME of day is your pain at its worst? morning  Sleep (in general) Fair  Pain is worse with: bending Pain improves with: medication Relief from Meds: 7  walk without assistance walk with assistance use a cane how many minutes can you walk? 30-40 ability to climb steps?  yes do you drive?  yes  retired  numbness trouble walking dizziness depression  New pt  New pt    Family History  Problem Relation Age of Onset   Breast cancer Mother 76       deceased 1; TAH/BSO in 50s/50s   Dementia Father        deceased 83    Breast cancer Maternal Aunt 82       deceased 89s   Colon cancer Neg Hx    Stomach cancer Neg Hx    Social History   Socioeconomic History   Marital status: Married    Spouse name: Not on file   Number of children: 2   Years of education: Not on file   Highest education level: Not on file  Occupational History   Occupation: Retired    Fish farm manager: [employer]  Tobacco Use   Smoking status: Never   Smokeless tobacco: Never  Vaping Use   Vaping Use: Never used  Substance and Sexual Activity   Alcohol use: No   Drug use: No   Sexual activity: Not on file  Other Topics Concern   Not on file  Social History Narrative   Not on file   Social Determinants of Health   Financial Resource Strain: Not on file  Food Insecurity: Not on file  Transportation Needs: Not on file  Physical Activity: Not on file  Stress: Not on file  Social Connections: Not on file   Past Surgical History:  Procedure Laterality Date   BREAST BIOPSY Left 05/31/2019   mri   BREAST BIOPSY Left 04/2019   malignant   BREAST LUMPECTOMY Left 04/17/2018   malignant   BREAST LUMPECTOMY WITH RADIOACTIVE SEED AND SENTINEL  LYMPH NODE BIOPSY Left 04/17/2018   Procedure: LEFT BREAST LUMPECTOMY WITH BRACKETED RADIOACTIVE SEEDS AND SENTINEL LYMPH NODE BIOPSY;  Surgeon: Rolm Bookbinder, MD;  Location: [place];  Service: General;  Laterality: Left;   BREAST SURGERY Left    Lumpectomy   CARDIOVASCULAR STRESS TEST  07/19/2006   EF 70%, NO EVIDENCE OF ISCHEMIA   CHOLECYSTECTOMY  12/30/10   COLONOSCOPY     DILATION AND CURETTAGE OF UTERUS     HYSTEROSCOPY WITH D & C N/A 06/07/2018   Procedure: DILATATION AND CURETTAGE /HYSTEROSCOPY;  Surgeon: [name], MD;  Location: [facility];  Service: Gynecology;  Laterality: N/A;   inguinal herniography     NASAL SINUS SURGERY     RADIOACTIVE SEED GUIDED EXCISIONAL BREAST BIOPSY Right 04/17/2018   Procedure: RIGHT BREAST SEED GUIDED EXCISIONAL BIOPSY;  Surgeon:  Rolm Bookbinder, MD;  Location: [place];  Service: General;  Laterality: Right;   RE-EXCISION OF BREAST LUMPECTOMY Left 05/15/2018   Procedure: RE-EXCISION OF LEFT BREAST LUMPECTOMY, ASPIRATION OF LEFT AXILLARY SEROMA;  Surgeon: Rolm Bookbinder, MD;  Location: [place];  Service: General;  Laterality: Left;   T & A     TOOTH EXTRACTION     TOTAL KNEE ARTHROPLASTY Left 09/11/2017   Procedure: LEFT TOTAL KNEE ARTHROPLASTY;  Surgeon: Gaynelle Arabian, MD;  Location: [facility];  Service: Orthopedics;  Laterality: Left;  with block   US ECHOCARDIOGRAPHY  07/17/2006   EF 55-60%   Past Medical History:  Diagnosis Date   Arthritis    hands and knees   Complication of anesthesia    aspiration pna; following a colonoscopy, pt requests head of bed elevated if possible.   Constipation    Cough    Depression    Dysrhythmia    RBBB on 06-06-17 ekg    Elevated liver function tests    Esophageal stricture    Family history of breast cancer    Gallstones    Genetic testing 04/02/2018   STAT Breast panel with reflex to Multi-Cancer panel (83 genes) @ [facility] - No pathogenic mutations detected   GERD (gastroesophageal reflux disease)    Hiatal hernia    History of kidney stones    History of radiation therapy 07/09/2018- 08/03/18   Left Breast/ 40.05 Gy in 15 fractions. Left Breast boost 10 Gy in 5 fractions.    Hyperlipemia    Hypothyroidism    Insomnia    Malignant neoplasm of upper-outer quadrant of left female breast ([place])    Nephrolithiasis    Pneumonia 2015   aspirated after colonosopy   Renal cyst    BP 126/80   Pulse 74   Temp 98 F (36.7 C)   Ht 5\' 8"  (1.727 m)   Wt 159 lb 6.4 oz (72.3 kg)   SpO2 96%   BMI 24.24 kg/m   Opioid Risk Score:   Fall Risk Score:  `1  Depression screen PHQ 2/9  Depression screen PHQ 2/9 06/05/2018  Decreased Interest 0  Down, Depressed, Hopeless 0  PHQ - 2 Score 0  Some recent data might be hidden     Review of  Systems     Objective:   Physical Exam Vitals and nursing note reviewed.  Constitutional:      Appearance: She is normal weight.  HENT:     Head: Normocephalic and atraumatic.     Mouth/Throat:     Mouth: Mucous membranes are dry.  Eyes:  Extraocular Movements: Extraocular movements intact.     Conjunctiva/sclera: Conjunctivae normal.     Pupils: Pupils are equal, round, and reactive to light.  Cardiovascular:     Rate and Rhythm: Normal rate and regular rhythm.     Pulses: Normal pulses.     Heart sounds: Normal heart sounds. No murmur heard. Pulmonary:     Effort: Pulmonary effort is normal. No respiratory distress.  Abdominal:     General: Abdomen is flat. Bowel sounds are normal. There is no distension.     Palpations: Abdomen is soft.  Musculoskeletal:        General: No tenderness.     Cervical back: Normal range of motion.     Right lower leg: No edema.     Left lower leg: No edema.     Comments: Left knee with full range of motion flexion and extension, no evidence of effusion no evidence of deformity, well-healed surgical incision, no hypersensitivity to touch there is a area of reduced sensation lateral infrapatellar area No pain with weightbearing Right knee with full range of motion flexion extension no evidence of effusion no evidence of deformity, no evidence of muscle atrophy, no hypersensitivity to touch. Gait shows no evidence of antalgia. No pain with hip range of motion Lumbar spine is normal range of motion with flexion and with extension. Negative straight leg raising test There is mild tenderness to palpation at the lumbosacral junction bilaterally.  Skin:    General: Skin is warm and dry.  Neurological:     Mental Status: She is alert and oriented to person, place, and time.     Cranial Nerves: No dysarthria.     Sensory: Sensory deficit present.     Motor: No weakness.     Coordination: Coordination normal.     Gait: Gait normal.     Deep  Tendon Reflexes:     Reflex Scores:      Patellar reflexes are 2+ on the right side and 2+ on the left side.      Achilles reflexes are 0 on the right side and 2+ on the left side.    Comments: Reduced sensation right L3-L4-L5 dermatomal distribution Motor strength is 5/5 bilateral hip flexor knee extensor ankle dorsiflexion plantarflexion Motor strength is 5/5 bilateral deltoid, bicep, tricep, grip  Psychiatric:        Mood and Affect: Mood normal.        Behavior: Behavior normal.        Thought Content: Thought content normal.        Judgment: Judgment normal.          Assessment & Plan:   #1.  Chronic postoperative pain after left TKR no signs of reflex sympathetic dystrophy.  The hardware has been reevaluated radiologically with no evidence of loosening or infection. Chronic pain after knee surgery is not uncommon, she notes that her sister had a similar outcome. We discussed treatment options including genicular nerve blocks as the knees to evaluate for genicular nerve radiofrequency neurotomy.  While the patient will consider this for the future, her primary pain concern is her low back with pain radiating down the right side. 2.  Right lower extremity sciatica with sensory deficits at L4-5 and S1.  MRI reviewed, there is evidence of L4-5 stenosis potentially impinging upon L5 nerve root. Patient had previous good results with lumbar epidural but of shorter than desired duration.  Will ask for prior injection notes from Dr. Brien Few.  Would consider  translaminar route versus transforaminal depending on which one has been tried already. Discussed with patient and her husband in agreement.  Patient not on any anticoagulants

## 2021-10-12 NOTE — Patient Instructions (Signed)
Will do Lumbar injection

## 2021-10-15 ENCOUNTER — Encounter: Payer: Self-pay | Admitting: Physical Medicine & Rehabilitation

## 2021-10-15 ENCOUNTER — Encounter (HOSPITAL_BASED_OUTPATIENT_CLINIC_OR_DEPARTMENT_OTHER): Payer: Medicare Other | Admitting: Physical Medicine & Rehabilitation

## 2021-10-15 ENCOUNTER — Other Ambulatory Visit: Payer: Self-pay

## 2021-10-15 DIAGNOSIS — M5416 Radiculopathy, lumbar region: Secondary | ICD-10-CM | POA: Diagnosis not present

## 2021-10-15 DIAGNOSIS — M48061 Spinal stenosis, lumbar region without neurogenic claudication: Secondary | ICD-10-CM | POA: Diagnosis not present

## 2021-10-15 DIAGNOSIS — G8928 Other chronic postprocedural pain: Secondary | ICD-10-CM | POA: Diagnosis not present

## 2021-10-15 NOTE — Progress Notes (Signed)
  PROCEDURE RECORD [facility]   Name: [name] DOB:1946-12-11 MRN: [id number]  Date:10/15/2021  Physician: Alysia Penna, MD    Nurse/CMA: Truman Hayward, CMA  Allergies:  Allergies  Allergen Reactions   Oxycontin [Oxycodone Hcl]     "it made me feel very high and hyper"    Codeine Nausea And Vomiting    Consent Signed: Yes.    Is patient diabetic? No.  CBG today? .  Pregnant: No. LMP: No LMP recorded. Patient is postmenopausal. (age 80-55)  Anticoagulants: no Anti-inflammatory: yes (Advil) Antibiotics: no  Procedure: Right L4-5 or L5-S1 Epidural Steroid Injection  Position: Prone Start Time: 12:20 pm  End Time: 12:27 pm  Fluoro Time: 28  RN/CMA Truman Hayward, CMA Brendalyn Vallely, CMA    Time 11;57 am 12:33 pm    BP 118/81 132 89    Pulse 90 91    Respirations 16 16    O2 Sat 96 96    S/S 6 6    Pain Level 8/10 1/10     D/C home with husband, patient A & O X 3, D/C instructions reviewed, and sits independently.

## 2021-10-15 NOTE — Patient Instructions (Addendum)

## 2021-10-15 NOTE — Progress Notes (Signed)
Lumbar transforaminal epidural steroid injection under fluoroscopic guidance with contrast enhancement  Indication: Lumbosacral radiculitis is not relieved by medication management or other conservative care and interfering with self-care and mobility.   Informed consent was obtained after describing risk and benefits of the procedure with the patient, this includes bleeding, bruising, infection, paralysis and medication side effects.  The patient wishes to proceed and has given written consent.  Patient was placed in prone position.  The lumbar area was marked and prepped with Betadine.  It was entered with a 25-gauge 1-1/2 inch needle and one mL of 1% lidocaine was injected into the skin and subcutaneous tissue.  Then a 22-gauge 3.5 inch spinal needle was inserted into the L4-5 intervertebral foramen under AP, lateral, and oblique view.  Once needle tip was within the foramen on lateral views an dnor exceeding 6 o clock position on th epedical on AP viewed Isovue 200 was inected x 12ml Then a solution containing one mL of 10 mg per mL dexamethasone and 2 mL of 1% lidocaine was injected.  The patient tolerated procedure well.  Post procedure instructions were given.  Please see post procedure form. Monitor response, consider L5-S1 transforaminal injection if L4-5 does not give adequate relief of radicular pain down the right lower extremity.

## 2021-10-22 ENCOUNTER — Telehealth: Payer: Self-pay

## 2021-10-22 NOTE — Telephone Encounter (Signed)
Patient called stating the epidural she received on 10/15/21 did not work. She states she is in a lot of pain. I spoke with her and informed her that Dr. Letta Pate is out of the office today, but will return on 10/26/21. I informed her that once he sends information about what the next steps are, that I will give her a call.

## 2021-10-25 ENCOUNTER — Emergency Department (HOSPITAL_COMMUNITY)
Admission: EM | Admit: 2021-10-25 | Discharge: 2021-10-25 | Discharge status: Against Medical Advice | Payer: Medicare Other

## 2021-10-25 ENCOUNTER — Telehealth: Payer: Self-pay | Admitting: Physical Medicine & Rehabilitation

## 2021-10-25 DIAGNOSIS — Z79899 Other long term (current) drug therapy: Secondary | ICD-10-CM | POA: Diagnosis not present

## 2021-10-25 DIAGNOSIS — Z853 Personal history of malignant neoplasm of breast: Secondary | ICD-10-CM | POA: Diagnosis not present

## 2021-10-25 DIAGNOSIS — Z791 Long term (current) use of non-steroidal anti-inflammatories (NSAID): Secondary | ICD-10-CM | POA: Diagnosis not present

## 2021-10-25 DIAGNOSIS — Z96659 Presence of unspecified artificial knee joint: Secondary | ICD-10-CM | POA: Diagnosis not present

## 2021-10-25 DIAGNOSIS — Z20822 Contact with and (suspected) exposure to covid-19: Secondary | ICD-10-CM | POA: Diagnosis not present

## 2021-10-25 DIAGNOSIS — G459 Transient cerebral ischemic attack, unspecified: Secondary | ICD-10-CM | POA: Diagnosis not present

## 2021-10-25 DIAGNOSIS — I725 Aneurysm of other precerebral arteries: Secondary | ICD-10-CM | POA: Diagnosis not present

## 2021-10-25 DIAGNOSIS — M859 Disorder of bone density and structure, unspecified: Secondary | ICD-10-CM | POA: Diagnosis not present

## 2021-10-25 DIAGNOSIS — K449 Diaphragmatic hernia without obstruction or gangrene: Secondary | ICD-10-CM | POA: Diagnosis not present

## 2021-10-25 DIAGNOSIS — R413 Other amnesia: Secondary | ICD-10-CM | POA: Diagnosis not present

## 2021-10-25 DIAGNOSIS — G319 Degenerative disease of nervous system, unspecified: Secondary | ICD-10-CM | POA: Diagnosis not present

## 2021-10-25 DIAGNOSIS — J984 Other disorders of lung: Secondary | ICD-10-CM | POA: Diagnosis not present

## 2021-10-25 DIAGNOSIS — Z9889 Other specified postprocedural states: Secondary | ICD-10-CM | POA: Diagnosis not present

## 2021-10-25 DIAGNOSIS — Z7982 Long term (current) use of aspirin: Secondary | ICD-10-CM | POA: Diagnosis not present

## 2021-10-25 DIAGNOSIS — I4510 Unspecified right bundle-branch block: Secondary | ICD-10-CM | POA: Diagnosis not present

## 2021-10-25 DIAGNOSIS — R001 Bradycardia, unspecified: Secondary | ICD-10-CM | POA: Diagnosis not present

## 2021-10-25 DIAGNOSIS — E785 Hyperlipidemia, unspecified: Secondary | ICD-10-CM | POA: Diagnosis not present

## 2021-10-25 DIAGNOSIS — N3000 Acute cystitis without hematuria: Secondary | ICD-10-CM | POA: Diagnosis not present

## 2021-10-25 DIAGNOSIS — R410 Disorientation, unspecified: Secondary | ICD-10-CM | POA: Diagnosis not present

## 2021-10-25 DIAGNOSIS — N281 Cyst of kidney, acquired: Secondary | ICD-10-CM | POA: Diagnosis not present

## 2021-10-25 DIAGNOSIS — Z79890 Hormone replacement therapy (postmenopausal): Secondary | ICD-10-CM | POA: Diagnosis not present

## 2021-10-25 DIAGNOSIS — Z9221 Personal history of antineoplastic chemotherapy: Secondary | ICD-10-CM | POA: Diagnosis not present

## 2021-10-25 DIAGNOSIS — R739 Hyperglycemia, unspecified: Secondary | ICD-10-CM | POA: Diagnosis not present

## 2021-10-25 DIAGNOSIS — R1110 Vomiting, unspecified: Secondary | ICD-10-CM | POA: Diagnosis not present

## 2021-10-25 DIAGNOSIS — E039 Hypothyroidism, unspecified: Secondary | ICD-10-CM | POA: Diagnosis not present

## 2021-10-25 DIAGNOSIS — Z885 Allergy status to narcotic agent status: Secondary | ICD-10-CM | POA: Diagnosis not present

## 2021-10-25 DIAGNOSIS — R0902 Hypoxemia: Secondary | ICD-10-CM | POA: Diagnosis not present

## 2021-10-25 DIAGNOSIS — R9082 White matter disease, unspecified: Secondary | ICD-10-CM | POA: Diagnosis not present

## 2021-10-25 DIAGNOSIS — N3090 Cystitis, unspecified without hematuria: Secondary | ICD-10-CM | POA: Diagnosis not present

## 2021-10-25 NOTE — Telephone Encounter (Signed)
Called to Schedule for L5-S1 Transforaminal ESI per message from Dr Letta Pate, patient will call back to schedule is currently in ER in [place].

## 2021-10-25 NOTE — ED Triage Notes (Signed)
Patient arrived by [facility] and here with anxiety and extremely upset since forgoting today that her best friend has died and buried last 03/11/2023. Patient alert and oriented, NAD

## 2021-10-26 DIAGNOSIS — I4510 Unspecified right bundle-branch block: Secondary | ICD-10-CM | POA: Diagnosis not present

## 2021-10-29 NOTE — Telephone Encounter (Signed)
Has patient been scheduled for ESI?

## 2021-11-01 DIAGNOSIS — E785 Hyperlipidemia, unspecified: Secondary | ICD-10-CM | POA: Diagnosis not present

## 2021-11-01 DIAGNOSIS — R413 Other amnesia: Secondary | ICD-10-CM | POA: Diagnosis not present

## 2021-11-01 DIAGNOSIS — M1990 Unspecified osteoarthritis, unspecified site: Secondary | ICD-10-CM | POA: Diagnosis not present

## 2021-11-01 DIAGNOSIS — M5430 Sciatica, unspecified side: Secondary | ICD-10-CM | POA: Diagnosis not present

## 2021-11-01 DIAGNOSIS — Z0000 Encounter for general adult medical examination without abnormal findings: Secondary | ICD-10-CM | POA: Diagnosis not present

## 2021-11-01 DIAGNOSIS — C50412 Malignant neoplasm of upper-outer quadrant of left female breast: Secondary | ICD-10-CM | POA: Diagnosis not present

## 2021-11-01 DIAGNOSIS — K760 Fatty (change of) liver, not elsewhere classified: Secondary | ICD-10-CM | POA: Diagnosis not present

## 2021-11-01 DIAGNOSIS — E039 Hypothyroidism, unspecified: Secondary | ICD-10-CM | POA: Diagnosis not present

## 2021-11-01 DIAGNOSIS — R82998 Other abnormal findings in urine: Secondary | ICD-10-CM | POA: Diagnosis not present

## 2021-11-01 DIAGNOSIS — M8580 Other specified disorders of bone density and structure, unspecified site: Secondary | ICD-10-CM | POA: Diagnosis not present

## 2021-11-01 DIAGNOSIS — I700 Atherosclerosis of aorta: Secondary | ICD-10-CM | POA: Diagnosis not present

## 2021-11-01 DIAGNOSIS — G4700 Insomnia, unspecified: Secondary | ICD-10-CM | POA: Diagnosis not present

## 2021-11-01 DIAGNOSIS — F329 Major depressive disorder, single episode, unspecified: Secondary | ICD-10-CM | POA: Diagnosis not present

## 2021-11-01 NOTE — Progress Notes (Addendum)
[name]  Telephone:([phone]) [phone] Fax:(336) [phone]    ID: [name] DOB: 1946/05/21  MR#: [id number]  YFV#:[id number]  Patient Care Team: Shon Baton, MD as PCP - General Rolm Bookbinder, MD as Consulting Physician (General Surgery) Magrinat, Virgie Dad, MD as Consulting Physician (Oncology) Eppie Gibson, MD as Attending Physician (Radiation Oncology) Lelon Perla, MD as Consulting Physician (Cardiology) [name], MD as Consulting Physician (Obstetrics and Gynecology) Jacquelynn Cree, PT as Physical Therapist (Physical Therapy) Donzetta Sprung., MD as Referring Physician (Sports Medicine) OTHER MD:  I connected with [name] on 11/02/21 at 12:00 PM EST by video enabled telemedicine visit and verified that I am speaking with the correct person using two identifiers.   I discussed the limitations, risks, security and privacy concerns of performing an evaluation and management service by telemedicine and the availability of in-person appointments. I also discussed with the patient that there may be a patient responsible charge related to this service. The patient expressed understanding and agreed to proceed.   Other persons participating in the visit and their role in the encounter: patient's husband   Patient's location: Home Provider's location: Friend   I provided 15 minutes of face-to-face video visit time during this encounter, and > 50% was spent counseling as documented under my assessment & plan.   CHIEF COMPLAINT: Estrogen receptor positive breast cancer  CURRENT TREATMENT: Anastrozole every Monday Wednesday and Friday   INTERVAL HISTORY: [name] was contacted today for follow-up of her estrogen receptor positive breast cancer.  Her husband participated in the visit.  The point of this virtual visit of course was to find out how she was tolerating anastrozole at reduced dose.  Specifically we reduced her  anastrozole to 3 days a week at her last visit on 09/02/2021.  She tells me she is tolerating it much better.  She feels better and has less of a mental fog.  When asked how she knows that she says that her husband says so.  Her most recent bone density screening from 02/18/2021 showed a T-score of -1.8, which is moderately osteopenic.  She is scheduled for annual mammography on 01/04/2022.   REVIEW OF SYSTEMS: [name] developed vomiting and confusion on 10/25/2021 and was evaluated and [facility] with a CT angio of the chest abdomen and pelvis which was negative except for hiatal hernia and CT angio of the head and neck which showed aneurysmal dilatation of the basilar tip which appears eccentric to the left at origin of left PCA. This measures up to 3.4 mm. No large vessel stenosis or intravascular thrombus seen.  She has been scheduled to see neurosurgery in [place].  She currently has no symptoms related to the vomiting and confusion episode which she thinks was due to to an emotional reaction to a friend's death.   COVID 19 VACCINATION STATUS: Status post Moderna x2, most recently February 2021, with booster October 2021    HISTORY OF CURRENT ILLNESS: From the original intake note:  "[name]" had routine diagnostic mammography on 03/08/2018 showing breast density category D.  There was a 1.5 cm irregular mass in the left upper outer quadrant posterior depth.  This was further imaged with ultrasonography on the same day.  The left axilla was sonographically benign.  No abnormalities were seen in the left axilla.  Accordingly on 03/08/2018 she proceeded to biopsy of the left breast area in question. The pathology from this procedure showed ([id number]): Invasive ductal carcinoma, grade I-II.  HER-2 negative with the ratio being 1.61 and number per cell 3.30.  The tumor was estrogen receptor 100% positive and progesterone receptor 100% positive, both with strong staining intensity.  Ki67 was <1%.   On  03/18/2018, she underwent a bilateral breast MRI with and without contrast, showing the mass in question in a setting of 3.3 cm non-masslike enhancement in the posterior aspect of the left upper-outer quadrant. in addition there was a 1.1 cm mass in the left upper-outer quadrant located 2.1 cm anterior and lateral to the biopsy proven malignant.   Right breast showed two indeterminate 5 mm adjacent enhancing nodules in the upper-inner quadrant.   The patient's subsequent history is as detailed below.   PAST MEDICAL HISTORY: Past Medical History:  Diagnosis Date   Arthritis    hands and knees   Complication of anesthesia    aspiration pna; following a colonoscopy, pt requests head of bed elevated if possible.   Constipation    Cough    Depression    Dysrhythmia    RBBB on 06-06-17 ekg    Elevated liver function tests    Esophageal stricture    Family history of breast cancer    Gallstones    Genetic testing 04/02/2018   STAT Breast panel with reflex to Multi-Cancer panel (83 genes) @ [facility] - No pathogenic mutations detected   GERD (gastroesophageal reflux disease)    Hiatal hernia    History of kidney stones    History of radiation therapy 07/09/2018- 08/03/18   Left Breast/ 40.05 Gy in 15 fractions. Left Breast boost 10 Gy in 5 fractions.    Hyperlipemia    Hypothyroidism    Insomnia    Malignant neoplasm of upper-outer quadrant of left female breast ([place])    Nephrolithiasis    Pneumonia 2015   aspirated after colonosopy   Renal cyst     PAST SURGICAL HISTORY: Past Surgical History:  Procedure Laterality Date   BREAST BIOPSY Left 05/31/2019   mri   BREAST BIOPSY Left 04/2019   malignant   BREAST LUMPECTOMY Left 04/17/2018   malignant   BREAST LUMPECTOMY WITH RADIOACTIVE SEED AND SENTINEL LYMPH NODE BIOPSY Left 04/17/2018   Procedure: LEFT BREAST LUMPECTOMY WITH BRACKETED RADIOACTIVE SEEDS AND SENTINEL LYMPH NODE BIOPSY;  Surgeon: Rolm Bookbinder, MD;  Location: [facility];  Service: General;  Laterality: Left;   BREAST SURGERY Left    Lumpectomy   CARDIOVASCULAR STRESS TEST  07/19/2006   EF 70%, NO EVIDENCE OF ISCHEMIA   CHOLECYSTECTOMY  12/30/10   COLONOSCOPY     DILATION AND CURETTAGE OF UTERUS     HYSTEROSCOPY WITH D & C N/A 06/07/2018   Procedure: DILATATION AND CURETTAGE /HYSTEROSCOPY;  Surgeon: [name], MD;  Location: [facility];  Service: Gynecology;  Laterality: N/A;   inguinal herniography     NASAL SINUS SURGERY     RADIOACTIVE SEED GUIDED EXCISIONAL BREAST BIOPSY Right 04/17/2018   Procedure: RIGHT BREAST SEED GUIDED EXCISIONAL BIOPSY;  Surgeon: Rolm Bookbinder, MD;  Location: [facility];  Service: General;  Laterality: Right;   RE-EXCISION OF BREAST LUMPECTOMY Left 05/15/2018   Procedure: RE-EXCISION OF LEFT BREAST LUMPECTOMY, ASPIRATION OF LEFT AXILLARY SEROMA;  Surgeon: Rolm Bookbinder, MD;  Location: [facility];  Service: General;  Laterality: Left;   T & A     TOOTH EXTRACTION     TOTAL KNEE ARTHROPLASTY Left 09/11/2017   Procedure: LEFT TOTAL KNEE ARTHROPLASTY;  Surgeon: Gaynelle Arabian,  MD;  Location: [facility];  Service: Orthopedics;  Laterality: Left;  with block   US ECHOCARDIOGRAPHY  07/17/2006   EF 55-60%    FAMILY HISTORY Family History  Problem Relation Age of Onset   Breast cancer Mother 39       deceased 35; TAH/BSO in 37s/50s   Dementia Father        deceased 27   Breast cancer Maternal Aunt 82       deceased 35s   Colon cancer Neg Hx    Stomach cancer Neg Hx   Her father died of old age at 14 years old. Her mother had breast cancer at 67 and died at 75 years old. She will undergo genetic testing. Her maternal aunt had breast cancer at 34 years old. No family history of ovarian cancer. She had one brother and one sister. The patient's sister Marcelina Morel is also my patient   GYNECOLOGIC HISTORY:  No LMP recorded. Patient is postmenopausal. Menarche: 75 years old [place] P  0 LMP: 1997 Contraceptive: 2 years HRT: Estrogen and progesterone for about 7 years  Hysterectomy? No   SOCIAL HISTORY:  She is a retired Futures trader. She has been married to [place], for 45 plus years. He worked in the [employer]. Together they adopted two children. One daughter, [name], 108 of [place], who is a Psychologist, sport and exercise for an OBGYN, and one son, Joee Iovine, 36 of [place], who owns a Marketing executive business. The patient has three grandchildren. She attends [employer].    ADVANCED DIRECTIVES: In place   HEALTH MAINTENANCE: Social History   Tobacco Use   Smoking status: Never   Smokeless tobacco: Never  Vaping Use   Vaping Use: Never used  Substance Use Topics   Alcohol use: No   Drug use: No    Colonoscopy: 2014/ [facility]  PAP: 2018  Bone density:  May [facility] showed a T score of -0.9 normal   Allergies  Allergen Reactions   Oxycontin [Oxycodone Hcl]     "it made me feel very high and hyper"    Codeine Nausea And Vomiting    Current Outpatient Medications  Medication Sig Dispense Refill   acetaminophen (TYLENOL) 500 MG tablet Take 500 mg by mouth every 6 (six) hours as needed for mild pain or headache. (Patient not taking: No sig reported)     ALPRAZolam (XANAX) 0.25 MG tablet      aspirin 81 MG EC tablet Take by mouth.     azelastine (ASTELIN) 0.1 % nasal spray Place 1 spray into both nostrils 2 (two) times daily. Use in each nostril as directed     buPROPion (WELLBUTRIN XL) 150 MG 24 hr tablet Take by mouth.     cefdinir (OMNICEF) 300 MG capsule      Cholecalciferol (VITAMIN D3) 1000 units CAPS Take 1 capsule by mouth 2 (two) times daily.      cyclobenzaprine (FLEXERIL) 10 MG tablet      cycloSPORINE (RESTASIS) 0.05 % ophthalmic emulsion Apply to eye.     cycloSPORINE, PF, (CEQUA) 0.09 % SOLN      diclofenac Sodium (VOLTAREN) 1 % GEL diclofenac 1 % topical gel  apply 2 grams to affected area two to three times  a day     escitalopram (LEXAPRO) 10 MG tablet Take by mouth.     esomeprazole (NEXIUM) 40 MG capsule Take 40 mg by mouth 2 (two) times daily before a meal.  ezetimibe (ZETIA) 10 MG tablet Take 10 mg by mouth at bedtime.     fluoruracil (CARAC) 0.5 % cream      fluticasone (FLONASE) 50 MCG/ACT nasal spray Place 1 spray into both nostrils daily.     Fluticasone Propionate, Inhal, 50 MCG/ACT AEPB Inhale into the lungs.     letrozole (FEMARA) 2.5 MG tablet Take by mouth.     levothyroxine (SYNTHROID, LEVOTHROID) 50 MCG tablet Take 50 mcg by mouth at bedtime.     Lifitegrast (XIIDRA) 5 % SOLN Place 1 drop into both eyes 2 (two) times daily.     linaclotide (LINZESS) 290 MCG CAPS capsule      Magnesium 250 MG TABS      meclizine (ANTIVERT) 25 MG tablet      meloxicam (MOBIC) 7.5 MG tablet Mobic     Naftifine HCl 1 % GEL      naproxen sodium (ALEVE) 220 MG tablet Take 220 mg by mouth 2 (two) times daily as needed (pain).     ondansetron (ZOFRAN-ODT) 8 MG disintegrating tablet      OVER THE COUNTER MEDICATION Apply 1 application topically daily as needed (pain). Natural pain relief ointment     predniSONE (STERAPRED UNI-PAK 21 TAB) 10 MG (21) TBPK tablet      pregabalin (LYRICA) 75 MG capsule Lyrica 75 mg capsule  Take 1 capsule twice a day by oral route.     Probiotic Product (PROBIOTIC ADVANCED PO) Take 1 tablet by mouth daily.     rosuvastatin (CRESTOR) 5 MG tablet Take 5 mg by mouth at bedtime.      sodium chloride (OCEAN) 0.65 % SOLN nasal spray Place 4 sprays into both nostrils 4 (four) times daily.     SUPER B COMPLEX/C PO Take 1 tablet by mouth daily.     traMADol (ULTRAM) 50 MG tablet      tretinoin (RETIN-A) 0.1 % cream      UNABLE TO FIND Med Name: CBD Cream     zolpidem (AMBIEN) 10 MG tablet zolpidem 10 mg tablet  take 1 tablet by mouth at bedtime if needed     No current facility-administered medications for this visit.    OBJECTIVE: white woman who appears stated age There  were no vitals filed for this visit.    There is no height or weight on file to calculate BMI.   Wt Readings from Last 3 Encounters:  10/12/21 159 lb 6.4 oz (72.3 kg)  09/02/21 160 lb 1.6 oz (72.6 kg)  06/02/21 163 lb 9.6 oz (74.2 kg)      ECOG FS:1 - Symptomatic but completely ambulatory  Telemedicine visit 11/02/2021   LAB RESULTS:  CMP     Component Value Date/Time   NA 141 09/02/2021 1231   NA 144 06/06/2017 1448   K 4.4 09/02/2021 1231   CL 107 09/02/2021 1231   CO2 26 09/02/2021 1231   GLUCOSE 110 (H) 09/02/2021 1231   BUN 15 09/02/2021 1231   BUN 12 06/06/2017 1448   CREATININE 0.76 09/02/2021 1231   CREATININE 0.80 11/26/2019 1024   CALCIUM 10.0 09/02/2021 1231   PROT 6.6 09/02/2021 1231   PROT 6.3 06/06/2017 1448   ALBUMIN 4.2 09/02/2021 1231   ALBUMIN 4.4 06/06/2017 1448   AST 39 09/02/2021 1231   AST 45 (H) 11/26/2019 1024   ALT 31 09/02/2021 1231   ALT 31 11/26/2019 1024   ALKPHOS 101 09/02/2021 1231   BILITOT 0.6 09/02/2021 1231   BILITOT  0.7 11/26/2019 1024   GFRNONAA >60 09/02/2021 1231   GFRNONAA >60 11/26/2019 1024   GFRAA >60 06/29/2020 1237   GFRAA >60 11/26/2019 1024    No results found for: TOTALPROTELP, ALBUMINELP, A1GS, A2GS, BETS, BETA2SER, GAMS, MSPIKE, SPEI  No results found for: KPAFRELGTCHN, LAMBDASER, KAPLAMBRATIO  Lab Results  Component Value Date   WBC 7.0 09/02/2021   NEUTROABS 3.0 09/02/2021   HGB 12.7 09/02/2021   HCT 38.3 09/02/2021   MCV 88.0 09/02/2021   PLT 211 09/02/2021   No results found for: LABCA2  No components found for: RWERXV400  No results for input(s): INR in the last 168 hours.  No results found for: LABCA2  No results found for: QQP619  No results found for: JKD326  No results found for: ZTI458  No results found for: CA2729  No components found for: HGQUANT  No results found for: CEA1 / No results found for: CEA1   No results found for: AFPTUMOR  No results found for: CHROMOGRNA  No  results found for: HGBA, HGBA2QUANT, HGBFQUANT, HGBSQUAN (Hemoglobinopathy evaluation)   No results found for: LDH  No results found for: IRON, TIBC, IRONPCTSAT (Iron and TIBC)  No results found for: FERRITIN  Urinalysis    Component Value Date/Time   COLORURINE YELLOW 08/22/2016 1947   APPEARANCEUR CLEAR 08/22/2016 1947   LABSPEC 1.026 08/22/2016 1947   PHURINE 5.5 08/22/2016 1947   GLUCOSEU NEGATIVE 08/22/2016 1947   HGBUR SMALL (A) 08/22/2016 1947   BILIRUBINUR NEGATIVE 08/22/2016 [place] 08/22/2016 1947   PROTEINUR NEGATIVE 08/22/2016 1947   UROBILINOGEN 0.2 08/22/2016 1622   NITRITE NEGATIVE 08/22/2016 1947   LEUKOCYTESUR SMALL (A) 08/22/2016 1947    STUDIES: No results found.   ASSESSMENT: 75 y.o. [place] woman status post left breast upper outer quadrant biopsy 03/08/2018 for a clinical T1c-T2 N0, stage I invasive ductal carcinoma, grade 1 or 2, estrogen and progesterone receptor positive, HER-2 not amplified, with an MIB-1 of less than 1%  (1) additional biopsies as follows:  (a) right breast biopsy ([id number]) on 04/02/2018 showed atypical lobular hyperplasia  (b) left breast ([id number]) on 03/22/2018 showed invasive lobular carcinoma, grade 2, estrogen and progesterone receptor positive with an MIB-1 of 5%. HER-2 not amplified  (2) bilateral lumpectomies and left sentinel lymph node sampling 04/17/2018 found  (a) on the right side, atypical lobular hyperplasia  (b) on the left side, an mpT1c pN0, stage IA, grade 2 invasive lobular carcinoma, with positive margins  (c) additional left breast surgery on 05/15/2018 cleared the compromise margins  (3) The Oncotype DX score was 14, predicting a risk of outside the breast recurrence over the next 9 years of 4% if the patient's only systemic therapy is tamoxifen for 5 years.  It also predicts no significant benefit from chemotherapy.  (4) adjuvant radiation 07/09/2018 - 08/03/2018  Site/dose:     1. Left Breast / 40.05 Gy in 15 fractions 2. Left Breast Boost / 10 Gy in 5 fractions  (5) started tamoxifen 09/11/2018, discontinued June 2020 with cognitive issues   (6) genetics testing 03/22/2018 through [facility]'s STAT Breast panel with reflex to Multi-Cancer panel found no pathogenic mutations in (ALK, APC, ATM, AXIN2, BAP1, BARD1, BLM, BMPR1A, BRCA1, BRCA2, BRIP1, CASR, CDC73, CDH1, CDK4, CDKN1B, CDKN1C, CDKN2A, CEBPA, CHEK2, CTNNA1, DICER1, DIS3L2, EGFR, EPCAM, FH, FLCN, GATA2, GPC3, GREM1, HOXB13, HRAS, KIT, MAX, MEN1, MET, MITF, MLH1, MSH2, MSH3, MSH6, MUTYH, NBN, NF1, NF2, NTHL1, PALB2, PDGFRA, PHOX2B, PMS2, POLD1, POLE, POT1, PRKAR1A,  PTCH1, PTEN, RAD50, RAD51C, RAD51D, RB1, RECQL4, RET, RUNX1, SDHA, SDHAF2, SDHB, SDHC, SDHD, SMAD4, SMARCA4, SMARCB1, SMARCE1, STK11, SUFU, TERC, TERT, TMEM127, TP53, TSC1, TSC2, VHL, WRN, WT1).  (a) Variants of Uncertain Significance were found in in PDGFRA and RECQL4.  (7) started letrozole 09/26/2019, discontinued 11/26/2019 with cognitive issues  (a) bone density May 2014 normal, T score -0.9  (b) letrozole resumed 2020-01-22, taken Mondays, Wednesday, Friday, as being off medication made no difference to either hot flashes or cognitive deficiencies  (c) letrozole discontinued October 2021 with multiple side effects  (8) exemestane prescribed 12/24/2020, discontinued mid March 2022 with intolerable side effect  (9) anastrozole started 03/15/2021, held mid June 2022 with concerns regarding cognitive dysfunction and severe arthralgias/myalgias, resumed August 2022, with similar symptoms, then decreased to Monday Wednesday Friday only as of September 2022, with improved tolerance.   PLAN: [name] is tolerating anastrozole adequately at Monday Wednesday and Friday dosing.  We have previously discussed switching to fulvestrant but she is very reluctant to do that due to sciatica problems.  If the fulvestrant class drugs become orally available perhaps that  would be a possibility for her in the future.  At this point however the plan is to continue anastrozole as currently prescribed for a minimum of 5 years.  I do not think the aneurysmal dilatation found by scans at [facility] explains the reason she went to the emergency room and she agrees.  Nevertheless this does need to be addressed and she has follow-up with neurosurgery already scheduled.  She understands that anastrozole is not associated with increased coagulation risks and it does not need to be interrupted pre or postop except for what ever time she needs to be n.p.o.  Assuming no other problems develop she will return to see [place] in about 3 months.  She knows to call for any other issue that may develop before then.   Rhapsody Wolven, Virgie Dad, MD  11/02/21 4:42 PM Medical Oncology and Hematology [facility] [place], [place] [address] Tel. [phone]    Fax. [phone]   I, Wilburn Mylar, am acting as scribe for Dr. Virgie Dad. Brandin Dilday.  I, Lurline Del MD, have reviewed the above documentation for accuracy and completeness, and I agree with the above.   *Total Encounter Time as defined by the Centers for Medicare and Medicaid Services includes, in addition to the face-to-face time of a patient visit (documented in the note above) non-face-to-face time: obtaining and reviewing outside history, ordering and reviewing medications, tests or procedures, care coordination (communications with other health care professionals or caregivers) and documentation in the medical record.

## 2021-11-02 ENCOUNTER — Inpatient Hospital Stay: Payer: Medicare Other | Attending: Oncology | Admitting: Oncology

## 2021-11-02 ENCOUNTER — Telehealth: Payer: Medicare Other | Admitting: Oncology

## 2021-11-02 DIAGNOSIS — Z170 Estrogen receptor positive status [ER+]: Secondary | ICD-10-CM

## 2021-11-02 DIAGNOSIS — C50412 Malignant neoplasm of upper-outer quadrant of left female breast: Secondary | ICD-10-CM | POA: Diagnosis not present

## 2021-11-03 NOTE — Telephone Encounter (Signed)
Patient is scheduled for ESI on 01/06/22

## 2021-11-08 DIAGNOSIS — R112 Nausea with vomiting, unspecified: Secondary | ICD-10-CM | POA: Diagnosis not present

## 2021-11-17 DIAGNOSIS — M48062 Spinal stenosis, lumbar region with neurogenic claudication: Secondary | ICD-10-CM | POA: Diagnosis not present

## 2021-12-14 ENCOUNTER — Telehealth: Payer: Self-pay

## 2021-12-14 NOTE — Telephone Encounter (Signed)
Patient called to get an explanation of the procedure she is getting on 12/17/21. Left voicemail to return call to clinic to get explanation.

## 2021-12-15 MED ORDER — DIAZEPAM 5 MG PO TABS: ORAL_TABLET | ORAL | 0 refills | Status: DC

## 2021-12-15 NOTE — Addendum Note (Signed)
Addended by: Caro Hight on: 12/15/2021 12:08 PM   Modules accepted: Orders

## 2021-12-15 NOTE — Telephone Encounter (Addendum)
Procedure explained to patient. She would like a valium prior to procedure. Diazepam 5 mg (per age protocol) called to her pharmacy to be taken one hour prior to procedure. Knows she must have a driver.

## 2021-12-17 ENCOUNTER — Other Ambulatory Visit: Payer: Self-pay

## 2021-12-17 ENCOUNTER — Encounter: Payer: Self-pay | Admitting: Physical Medicine & Rehabilitation

## 2021-12-17 ENCOUNTER — Encounter: Payer: Medicare Other | Attending: Physical Medicine & Rehabilitation | Admitting: Physical Medicine & Rehabilitation

## 2021-12-17 DIAGNOSIS — G8928 Other chronic postprocedural pain: Secondary | ICD-10-CM | POA: Diagnosis not present

## 2021-12-17 NOTE — Progress Notes (Signed)
LEFT genicular nerve blocks under fluoroscopic guidance  Indication Chronic severe post operative knee pain that has not responded to PT, medication, and other conservative care.  Informed consent was obtained after discussing risks and benefits of procedure with the patient.  These include bleeding, bruising and infection as well as foot numbness. Pt placed in supine position on the fluoro table .  Static images identified distal femur and proximal tibia.  Lateral and medial supracondylar , as well as medial tibial flare prepped with betadine.  Marked under fluoro and then a 25 g 1.5 inch needle was used to anesthetize skin and subcutaneous tissue. 1.5 cc was infiltrate into each of 3 sites. Then a 22-gauge 3.5 inch needle was inserted under fluoroscopic guidance first targeting the medial tibial flare midpoint. After bone contact was made lateral images confirmed proper positioning and Isovue 200 times one ML was injected. This showed no evidence of intravascular uptake. Then 1.5 ML  .25%marcaincaine was injected. This same procedure was treated for the lateral and medial supracondylar areas. Patient tolerated procedure well. Post procedure instructions given.

## 2021-12-17 NOTE — Progress Notes (Signed)
°  PROCEDURE RECORD [facility]   Name: [name] DOB:03-15-1946 MRN: [id number]  Date:12/17/2021  Physician: Alysia Penna, MD    Nurse/CMA: Ronnald Ramp, CMA  Allergies:  Allergies  Allergen Reactions   Oxycontin [Oxycodone Hcl]     "it made me feel very high and hyper"    Codeine Nausea And Vomiting    Consent Signed: Yes.    Is patient diabetic? No.  CBG today? no  Pregnant: No. LMP: No LMP recorded. Patient is postmenopausal. (age [age])  Anticoagulants: no Anti-inflammatory: yes (Advil) Antibiotics: no  Procedure: Left knee Genicular Nerve Block  Position: Supine Start Time: 2:13  End Time: 2:27  Fluoro Time: 46  RN/CMA Shanea Karney, CMA Jamarco Zaldivar, CMA    Time 1:51 2:35    BP 114/80 150/77    Pulse 80 80    Respirations 16 16    O2 Sat 98 97    S/S 6 6    Pain Level 8/10 2/10     D/C home with Delsa Bern, patient A & O X 3, D/C instructions reviewed, and sits independently.

## 2021-12-17 NOTE — Patient Instructions (Signed)
Genicular nerve blocks were performed today. This is to block pain signals from the knee. A local anesthetic was used to block the knee therefore this will not be a permanent procedure. Please keep track of your pain today and compared to the pain that you had prior to the injection. At next visit we will discuss the results. If it is helpful but only short-term we would need to confirm this with an additional injection prior to proceeding with a radiofrequency neurotomy

## 2022-01-04 ENCOUNTER — Ambulatory Visit
Admission: RE | Admit: 2022-01-04 | Discharge: 2022-01-04 | Disposition: A | Discharge status: Home / Self Care | Payer: Medicare Other | Source: Ambulatory Visit | Attending: Oncology | Admitting: Oncology

## 2022-01-04 ENCOUNTER — Other Ambulatory Visit: Payer: Self-pay | Admitting: Hematology and Oncology

## 2022-01-04 DIAGNOSIS — R922 Inconclusive mammogram: Secondary | ICD-10-CM | POA: Diagnosis not present

## 2022-01-04 DIAGNOSIS — Z853 Personal history of malignant neoplasm of breast: Secondary | ICD-10-CM

## 2022-01-04 HISTORY — DX: Personal history of irradiation: Z92.3

## 2022-01-06 ENCOUNTER — Ambulatory Visit: Payer: Medicare Other | Admitting: Physical Medicine & Rehabilitation

## 2022-01-11 DIAGNOSIS — M5416 Radiculopathy, lumbar region: Secondary | ICD-10-CM | POA: Diagnosis not present

## 2022-01-11 DIAGNOSIS — M4316 Spondylolisthesis, lumbar region: Secondary | ICD-10-CM | POA: Diagnosis not present

## 2022-01-21 ENCOUNTER — Telehealth: Payer: Self-pay | Admitting: Hematology and Oncology

## 2022-01-21 NOTE — Telephone Encounter (Signed)
Called patient regarding upcoming appointment, patient has been called and notified. ?

## 2022-01-28 ENCOUNTER — Ambulatory Visit: Payer: Medicare Other | Admitting: Physical Medicine & Rehabilitation

## 2022-02-03 ENCOUNTER — Inpatient Hospital Stay (HOSPITAL_BASED_OUTPATIENT_CLINIC_OR_DEPARTMENT_OTHER): Payer: Medicare Other | Admitting: Hematology and Oncology

## 2022-02-03 ENCOUNTER — Inpatient Hospital Stay: Payer: Medicare Other | Attending: Oncology

## 2022-02-03 ENCOUNTER — Other Ambulatory Visit: Payer: Self-pay

## 2022-02-03 ENCOUNTER — Encounter: Payer: Self-pay | Admitting: Hematology and Oncology

## 2022-02-03 VITALS — BP 136/68 | HR 82 | Temp 97.9°F | Wt 159.5 lb

## 2022-02-03 DIAGNOSIS — Z79811 Long term (current) use of aromatase inhibitors: Secondary | ICD-10-CM | POA: Insufficient documentation

## 2022-02-03 DIAGNOSIS — C50412 Malignant neoplasm of upper-outer quadrant of left female breast: Secondary | ICD-10-CM | POA: Insufficient documentation

## 2022-02-03 DIAGNOSIS — Z170 Estrogen receptor positive status [ER+]: Secondary | ICD-10-CM | POA: Diagnosis not present

## 2022-02-03 DIAGNOSIS — Z923 Personal history of irradiation: Secondary | ICD-10-CM | POA: Diagnosis not present

## 2022-02-03 DIAGNOSIS — M8580 Other specified disorders of bone density and structure, unspecified site: Secondary | ICD-10-CM | POA: Insufficient documentation

## 2022-02-03 LAB — CBC WITH DIFFERENTIAL/PLATELET
Abs Immature Granulocytes: 0.01 10*3/uL (ref 0.00–0.07)
Basophils Absolute: 0.1 10*3/uL (ref 0.0–0.1)
Basophils Relative: 1 %
Eosinophils Absolute: 0.5 10*3/uL (ref 0.0–0.5)
Eosinophils Relative: 6 %
HCT: 39.7 % (ref 36.0–46.0)
Hemoglobin: 12.9 g/dL (ref 12.0–15.0)
Immature Granulocytes: 0 %
Lymphocytes Relative: 41 %
Lymphs Abs: 3.1 10*3/uL (ref 0.7–4.0)
MCH: 29.7 pg (ref 26.0–34.0)
MCHC: 32.5 g/dL (ref 30.0–36.0)
MCV: 91.5 fL (ref 80.0–100.0)
Monocytes Absolute: 0.7 10*3/uL (ref 0.1–1.0)
Monocytes Relative: 9 %
Neutro Abs: 3.4 10*3/uL (ref 1.7–7.7)
Neutrophils Relative %: 43 %
Platelets: 202 10*3/uL (ref 150–400)
RBC: 4.34 MIL/uL (ref 3.87–5.11)
RDW: 13.7 % (ref 11.5–15.5)
WBC: 7.7 10*3/uL (ref 4.0–10.5)
nRBC: 0 % (ref 0.0–0.2)

## 2022-02-03 LAB — COMPREHENSIVE METABOLIC PANEL
ALT: 49 U/L — ABNORMAL HIGH (ref 0–44)
AST: 57 U/L — ABNORMAL HIGH (ref 15–41)
Albumin: 4.4 g/dL (ref 3.5–5.0)
Alkaline Phosphatase: 90 U/L (ref 38–126)
Anion gap: 4 — ABNORMAL LOW (ref 5–15)
BUN: 17 mg/dL (ref 8–23)
CO2: 30 mmol/L (ref 22–32)
Calcium: 10.2 mg/dL (ref 8.9–10.3)
Chloride: 107 mmol/L (ref 98–111)
Creatinine, Ser: 0.77 mg/dL (ref 0.44–1.00)
GFR, Estimated: >60 mL/min (ref >60)
Glucose, Bld: 97 mg/dL (ref 70–99)
Potassium: 4.2 mmol/L (ref 3.5–5.1)
Sodium: 141 mmol/L (ref 135–145)
Total Bilirubin: 0.6 mg/dL (ref 0.3–1.2)
Total Protein: 6.4 g/dL — ABNORMAL LOW (ref 6.5–8.1)

## 2022-02-03 NOTE — Progress Notes (Signed)
[place]  [phone] [phone]    ID: [name] DOB: [birth year]  MR#: [id number]  HEN#:[id number]  Patient Care Team: Shon Baton, MD as PCP - General Rolm Bookbinder, MD as Consulting Physician (General Surgery) Magrinat, Virgie Dad, MD (Inactive) as Consulting Physician (Oncology) Eppie Gibson, MD as Attending Physician (Radiation Oncology) Lelon Perla, MD as Consulting Physician (Cardiology) [name], MD as Consulting Physician (Obstetrics and Gynecology) Jacquelynn Cree, PT as Physical Therapist (Physical Therapy) Donzetta Sprung., MD as Referring Physician (Sports Medicine) Caryl Comes, MD as Physician Assistant (Neurosurgery)   CHIEF COMPLAINT: Estrogen receptor positive breast cancer  CURRENT TREATMENT: Anastrozole every Monday Wednesday and Friday  INTERVAL HISTORY:  [name] is here for follow-up of her estrogen receptor positive breast cancer.  During her last visit Dr. Jana Hakim reduced her anastrozole dose to 3 days a week.  She tells me she is tolerating it much better.  Her mental fog is better on lower dose. She is upset about having to take this pill until 2025. She also complains of pain in the left axilla.  She most recently had a mammogram, has extremely dense breasts however refuses to do MRI screening.  She tells me that she tried tamoxifen which is poisoned to her.  She has tried letrozole, exemestane and anastrozole at full dose.  She could only tolerate the lower dose of anastrozole.  She also has sciatica and takes ibuprofen every day for management of this.  She is not a candidate for surgery at this time according to her.  Her sister and mother both had breast cancer.  She denies any changes in breathing, bowel habits or urinary habits. Last bone density showed osteopenia in March 2022 Rest of the pertinent 10 point ROS reviewed and negative.  REVIEW OF SYSTEMS:    COVID 19 VACCINATION STATUS:  Status post Moderna x2, most recently February 2021, with booster October 2021    HISTORY OF CURRENT ILLNESS: From the original intake note:  "[name]" had routine diagnostic mammography on 03/08/2018 showing breast density category D.  There was a 1.5 cm irregular mass in the left upper outer quadrant posterior depth.  This was further imaged with ultrasonography on the same day.  The left axilla was sonographically benign.  No abnormalities were seen in the left axilla.  Accordingly on 03/08/2018 she proceeded to biopsy of the left breast area in question. The pathology from this procedure showed ([id number]): Invasive ductal carcinoma, grade I-II. HER-2 negative with the ratio being 1.61 and number per cell 3.30.  The tumor was estrogen receptor 100% positive and progesterone receptor 100% positive, both with strong staining intensity.  Ki67 was <1%.   On 03/18/2018, she underwent a bilateral breast MRI with and without contrast, showing the mass in question in a setting of 3.3 cm non-masslike enhancement in the posterior aspect of the left upper-outer quadrant. in addition there was a 1.1 cm mass in the left upper-outer quadrant located 2.1 cm anterior and lateral to the biopsy proven malignant.   Right breast showed two indeterminate 5 mm adjacent enhancing nodules in the upper-inner quadrant.   The patient's subsequent history is as detailed below.   PAST MEDICAL HISTORY: Past Medical History:  Diagnosis Date   Arthritis    hands and knees   Complication of anesthesia    aspiration pna; following a colonoscopy, pt requests head of bed elevated if possible.   Constipation    Cough    Depression  Dysrhythmia    RBBB on 06-06-17 ekg    Elevated liver function tests    Esophageal stricture    Family history of breast cancer    Gallstones    Genetic testing 04/02/2018   STAT Breast panel with reflex to Multi-Cancer panel (83 genes) @ [facility] - No pathogenic mutations detected   GERD  (gastroesophageal reflux disease)    Hiatal hernia    History of kidney stones    History of radiation therapy 07/09/2018- 08/03/18   Left Breast/ 40.05 Gy in 15 fractions. Left Breast boost 10 Gy in 5 fractions.    Hyperlipemia    Hypothyroidism    Insomnia    Malignant neoplasm of upper-outer quadrant of left female breast ([place])    Nephrolithiasis    Personal history of radiation therapy    Pneumonia 2015   aspirated after colonosopy   Renal cyst     PAST SURGICAL HISTORY: Past Surgical History:  Procedure Laterality Date   BREAST BIOPSY Left 05/31/2019   mri   BREAST BIOPSY Left 04/2019   malignant   BREAST LUMPECTOMY Left 04/17/2018   malignant   BREAST LUMPECTOMY WITH RADIOACTIVE SEED AND SENTINEL LYMPH NODE BIOPSY Left 04/17/2018   Procedure: LEFT BREAST LUMPECTOMY WITH BRACKETED RADIOACTIVE SEEDS AND SENTINEL LYMPH NODE BIOPSY;  Surgeon: Rolm Bookbinder, MD;  Location: [facility];  Service: General;  Laterality: Left;   BREAST SURGERY Left    Lumpectomy   CARDIOVASCULAR STRESS TEST  07/19/2006   EF 70%, NO EVIDENCE OF ISCHEMIA   CHOLECYSTECTOMY  12/30/10   COLONOSCOPY     DILATION AND CURETTAGE OF UTERUS     HYSTEROSCOPY WITH D & C N/A 06/07/2018   Procedure: DILATATION AND CURETTAGE /HYSTEROSCOPY;  Surgeon: [name], MD;  Location: [facility];  Service: Gynecology;  Laterality: N/A;   inguinal herniography     NASAL SINUS SURGERY     RADIOACTIVE SEED GUIDED EXCISIONAL BREAST BIOPSY Right 04/17/2018   Procedure: RIGHT BREAST SEED GUIDED EXCISIONAL BIOPSY;  Surgeon: Rolm Bookbinder, MD;  Location: [facility];  Service: General;  Laterality: Right;   RE-EXCISION OF BREAST LUMPECTOMY Left 05/15/2018   Procedure: RE-EXCISION OF LEFT BREAST LUMPECTOMY, ASPIRATION OF LEFT AXILLARY SEROMA;  Surgeon: Rolm Bookbinder, MD;  Location: [facility];  Service: General;  Laterality: Left;   T & A     TOOTH EXTRACTION     TOTAL KNEE  ARTHROPLASTY Left 09/11/2017   Procedure: LEFT TOTAL KNEE ARTHROPLASTY;  Surgeon: Gaynelle Arabian, MD;  Location: [facility];  Service: Orthopedics;  Laterality: Left;  with block   US ECHOCARDIOGRAPHY  07/17/2006   EF 55-60%    FAMILY HISTORY Family History  Problem Relation Age of Onset   Breast cancer Mother 34       deceased 39; TAH/BSO in 77s/50s   Dementia Father        deceased 28   Breast cancer Sister    Breast cancer Maternal Aunt 82       deceased 10s   Colon cancer Neg Hx    Stomach cancer Neg Hx   Her father died of old age at 76 years old. Her mother had breast cancer at 80 and died at 76 years old. She will undergo genetic testing. Her maternal aunt had breast cancer at 84 years old. No family history of ovarian cancer. She had one brother and one sister. The patient's sister Marcelina Morel is also my patient   GYNECOLOGIC HISTORY:  No LMP recorded. Patient is postmenopausal. Menarche: 76 years old [place] P 0 LMP: 1997 Contraceptive: 2 years HRT: Estrogen and progesterone for about 7 years  Hysterectomy? No   SOCIAL HISTORY:  She is a retired Futures trader. She has been married to [place], for 45 plus years. He worked in the [employer]. Together they adopted two children. One daughter, [name], 79 of [place], who is a Psychologist, sport and exercise for an OBGYN, and one son, Royanne Warshaw, 36 of [place], who owns a Marketing executive business. The patient has three grandchildren. She attends [employer].    ADVANCED DIRECTIVES: In place   HEALTH MAINTENANCE: Social History   Tobacco Use   Smoking status: Never   Smokeless tobacco: Never  Vaping Use   Vaping Use: Never used  Substance Use Topics   Alcohol use: No   Drug use: No    Colonoscopy: 2014/ [facility]  PAP: 2018  Bone density:  May 2014 at [facility] showed a T score of -0.9 normal   Allergies  Allergen Reactions   Oxycontin [Oxycodone Hcl]     "it made me feel very high and hyper"    Codeine  Nausea And Vomiting    Current Outpatient Medications  Medication Sig Dispense Refill   acetaminophen (TYLENOL) 500 MG tablet Take 500 mg by mouth every 6 (six) hours as needed for mild pain or headache. (Patient not taking: Reported on 02/17/2021)     ALPRAZolam (XANAX) 0.25 MG tablet  (Patient not taking: Reported on 12/17/2021)     aspirin 81 MG EC tablet Take by mouth.     azelastine (ASTELIN) 0.1 % nasal spray Place 1 spray into both nostrils 2 (two) times daily. Use in each nostril as directed     buPROPion (WELLBUTRIN XL) 150 MG 24 hr tablet Take by mouth.     cefdinir (OMNICEF) 300 MG capsule  (Patient not taking: Reported on 12/17/2021)     Cholecalciferol (VITAMIN D3) 1000 units CAPS Take 1 capsule by mouth 2 (two) times daily.      cyclobenzaprine (FLEXERIL) 10 MG tablet      cycloSPORINE (RESTASIS) 0.05 % ophthalmic emulsion Apply to eye.     cycloSPORINE, PF, (CEQUA) 0.09 % SOLN      diazepam (VALIUM) 5 MG tablet Take one tablet po one hour prior to procedure. Must have a driver. 1 tablet 0   diclofenac Sodium (VOLTAREN) 1 % GEL diclofenac 1 % topical gel  apply 2 grams to affected area two to three times a day     escitalopram (LEXAPRO) 10 MG tablet Take by mouth.     esomeprazole (NEXIUM) 40 MG capsule Take 40 mg by mouth 2 (two) times daily before a meal.     ezetimibe (ZETIA) 10 MG tablet Take 10 mg by mouth at bedtime.     fluoruracil (CARAC) 0.5 % cream      fluticasone (FLONASE) 50 MCG/ACT nasal spray Place 1 spray into both nostrils daily.     Fluticasone Propionate, Inhal, 50 MCG/ACT AEPB Inhale into the lungs.     letrozole (FEMARA) 2.5 MG tablet Take by mouth.     levothyroxine (SYNTHROID, LEVOTHROID) 50 MCG tablet Take 50 mcg by mouth at bedtime.     Lifitegrast (XIIDRA) 5 % SOLN Place 1 drop into both eyes 2 (two) times daily.     linaclotide (LINZESS) 290 MCG CAPS capsule      Magnesium 250 MG TABS      meclizine (ANTIVERT)  25 MG tablet      meloxicam (MOBIC) 7.5 MG  tablet Mobic     Naftifine HCl 1 % GEL      naproxen sodium (ALEVE) 220 MG tablet Take 220 mg by mouth 2 (two) times daily as needed (pain).     ondansetron (ZOFRAN-ODT) 8 MG disintegrating tablet      OVER THE COUNTER MEDICATION Apply 1 application topically daily as needed (pain). Natural pain relief ointment     predniSONE (STERAPRED UNI-PAK 21 TAB) 10 MG (21) TBPK tablet      pregabalin (LYRICA) 75 MG capsule Lyrica 75 mg capsule  Take 1 capsule twice a day by oral route.     Probiotic Product (PROBIOTIC ADVANCED PO) Take 1 tablet by mouth daily.     rosuvastatin (CRESTOR) 5 MG tablet Take 5 mg by mouth at bedtime.      sodium chloride (OCEAN) 0.65 % SOLN nasal spray Place 4 sprays into both nostrils 4 (four) times daily.     SUPER B COMPLEX/C PO Take 1 tablet by mouth daily.     traMADol (ULTRAM) 50 MG tablet      tretinoin (RETIN-A) 0.1 % cream      UNABLE TO FIND Med Name: CBD Cream     zolpidem (AMBIEN) 10 MG tablet zolpidem 10 mg tablet  take 1 tablet by mouth at bedtime if needed     No current facility-administered medications for this visit.    OBJECTIVE: white woman who appears stated age There were no vitals filed for this visit.    There is no height or weight on file to calculate BMI.   Wt Readings from Last 3 Encounters:  10/12/21 159 lb 6.4 oz (72.3 kg)  09/02/21 160 lb 1.6 oz (72.6 kg)  06/02/21 163 lb 9.6 oz (74.2 kg)      ECOG FS:1 - Symptomatic but completely ambulatory  Physical Exam Constitutional:      Appearance: Normal appearance.  HENT:     Head: Normocephalic and atraumatic.  Cardiovascular:     Rate and Rhythm: Normal rate and regular rhythm.     Pulses: Normal pulses.     Heart sounds: Normal heart sounds.  Pulmonary:     Effort: Pulmonary effort is normal.     Breath sounds: Normal breath sounds.  Chest:       Comments: Bilateral breast examined.  In the left axilla, there is a palpable area of firmness which is very tender and there is  also some tenderness along the margins of the breast.  I wonder if the margin of the breast which is tender is possibly fat necrosis.  However since have never examined her in the past, I cannot tell if the palpable area of firmness in the left axilla is chronic. Abdominal:     General: Abdomen is flat. Bowel sounds are normal.     Palpations: Abdomen is soft.  Musculoskeletal:     Cervical back: Normal range of motion and neck supple. No rigidity.  Lymphadenopathy:     Cervical: No cervical adenopathy.  Skin:    General: Skin is warm and dry.     Coloration: Skin is not jaundiced.  Neurological:     General: No focal deficit present.     Mental Status: She is alert.  Psychiatric:        Mood and Affect: Mood normal.      LAB RESULTS:  CMP     Component Value Date/Time   NA  141 09/02/2021 1231   NA 144 06/06/2017 1448   K 4.4 09/02/2021 1231   CL 107 09/02/2021 1231   CO2 26 09/02/2021 1231   GLUCOSE 110 (H) 09/02/2021 1231   BUN 15 09/02/2021 1231   BUN 12 06/06/2017 1448   CREATININE 0.76 09/02/2021 1231   CREATININE 0.80 11/26/2019 1024   CALCIUM 10.0 09/02/2021 1231   PROT 6.6 09/02/2021 1231   PROT 6.3 06/06/2017 1448   ALBUMIN 4.2 09/02/2021 1231   ALBUMIN 4.4 06/06/2017 1448   AST 39 09/02/2021 1231   AST 45 (H) 11/26/2019 1024   ALT 31 09/02/2021 1231   ALT 31 11/26/2019 1024   ALKPHOS 101 09/02/2021 1231   BILITOT 0.6 09/02/2021 1231   BILITOT 0.7 11/26/2019 1024   GFRNONAA >60 09/02/2021 1231   GFRNONAA >60 11/26/2019 1024   GFRAA >60 06/29/2020 1237   GFRAA >60 11/26/2019 1024    No results found for: TOTALPROTELP, ALBUMINELP, A1GS, A2GS, BETS, BETA2SER, GAMS, MSPIKE, SPEI  No results found for: KPAFRELGTCHN, LAMBDASER, KAPLAMBRATIO  Lab Results  Component Value Date   WBC 7.0 09/02/2021   NEUTROABS 3.0 09/02/2021   HGB 12.7 09/02/2021   HCT 38.3 09/02/2021   MCV 88.0 09/02/2021   PLT 211 09/02/2021   No results found for: LABCA2  No  components found for: [id number]  No results for input(s): INR in the last 168 hours.  No results found for: LABCA2  No results found for: MNO177  No results found for: NHA579  No results found for: UXY333  No results found for: CA2729  No components found for: HGQUANT  No results found for: CEA1 / No results found for: CEA1   No results found for: AFPTUMOR  No results found for: CHROMOGRNA  No results found for: HGBA, HGBA2QUANT, HGBFQUANT, HGBSQUAN (Hemoglobinopathy evaluation)   No results found for: LDH  No results found for: IRON, TIBC, IRONPCTSAT (Iron and TIBC)  No results found for: FERRITIN  Urinalysis    Component Value Date/Time   COLORURINE YELLOW 08/22/2016 1947   APPEARANCEUR CLEAR 08/22/2016 1947   LABSPEC 1.026 08/22/2016 1947   PHURINE 5.5 08/22/2016 1947   GLUCOSEU NEGATIVE 08/22/2016 1947   HGBUR SMALL (A) 08/22/2016 1947   BILIRUBINUR NEGATIVE 08/22/2016 1947   KETONESUR NEGATIVE 08/22/2016 1947   PROTEINUR NEGATIVE 08/22/2016 1947   UROBILINOGEN 0.2 08/22/2016 1622   NITRITE NEGATIVE 08/22/2016 1947   LEUKOCYTESUR SMALL (A) 08/22/2016 1947    STUDIES: MM DIAG BREAST TOMO BILATERAL  Result Date: 01/04/2022 CLINICAL DATA:  Patient with history of left breast lumpectomy 2019. EXAM: DIGITAL DIAGNOSTIC BILATERAL MAMMOGRAM WITH TOMOSYNTHESIS AND CAD TECHNIQUE: Bilateral digital diagnostic mammography and breast tomosynthesis was performed. The images were evaluated with computer-aided detection. COMPARISON:  Previous exam(s). ACR Breast Density Category d: The breast tissue is extremely dense, which lowers the sensitivity of mammography. FINDINGS: Stable postlumpectomy changes left breast. No new masses, calcifications or nonsurgical distortion identified within either breast. IMPRESSION: No mammographic evidence for malignancy. RECOMMENDATION: Per protocol, as the patient is now 2 or more years status post lumpectomy, she may return to annual  screening mammography in 1 year. However, given the history of breast cancer, the patient remains eligible for annual diagnostic mammography if preferred. I have discussed the findings and recommendations with the patient. If applicable, a reminder letter will be sent to the patient regarding the next appointment. BI-RADS CATEGORY  2: Benign. Electronically Signed   By: Lovey Newcomer M.D.   On: 01/04/2022 14:34  ASSESSMENT: 76 y.o. [place] woman status post left breast upper outer quadrant biopsy 03/08/2018 for a clinical T1c-T2 N0, stage I invasive ductal carcinoma, grade 1 or 2, estrogen and progesterone receptor positive, HER-2 not amplified, with an MIB-1 of less than 1%  (1) additional biopsies as follows:  (a) right breast biopsy ([id number]) on 04/02/2018 showed atypical lobular hyperplasia  (b) left breast ([id number]) on 03/22/2018 showed invasive lobular carcinoma, grade 2, estrogen and progesterone receptor positive with an MIB-1 of 5%. HER-2 not amplified  (2) bilateral lumpectomies and left sentinel lymph node sampling 04/17/2018 found  (a) on the right side, atypical lobular hyperplasia  (b) on the left side, an mpT1c pN0, stage IA, grade 2 invasive lobular carcinoma, with positive margins  (c) additional left breast surgery on 05/15/2018 cleared the compromise margins  (3) The Oncotype DX score was 14, predicting a risk of outside the breast recurrence over the next 9 years of 4% if the patient's only systemic therapy is tamoxifen for 5 years.  It also predicts no significant benefit from chemotherapy.  (4) adjuvant radiation 07/09/2018 - 08/03/2018  Site/dose:    1. Left Breast / 40.05 Gy in 15 fractions 2. Left Breast Boost / 10 Gy in 5 fractions  (5) started tamoxifen 09/11/2018, discontinued June 2020 with cognitive issues   (6) genetics testing 03/22/2018 through [facility]'s STAT Breast panel with reflex to Multi-Cancer panel found no pathogenic mutations in (ALK, APC,  ATM, AXIN2, BAP1, BARD1, BLM, BMPR1A, BRCA1, BRCA2, BRIP1, CASR, CDC73, CDH1, CDK4, CDKN1B, CDKN1C, CDKN2A, CEBPA, CHEK2, CTNNA1, DICER1, DIS3L2, EGFR, EPCAM, FH, FLCN, GATA2, GPC3, GREM1, HOXB13, HRAS, KIT, MAX, MEN1, MET, MITF, MLH1, MSH2, MSH3, MSH6, MUTYH, NBN, NF1, NF2, NTHL1, PALB2, PDGFRA, PHOX2B, PMS2, POLD1, POLE, POT1, PRKAR1A, PTCH1, PTEN, RAD50, RAD51C, RAD51D, RB1, RECQL4, RET, RUNX1, SDHA, SDHAF2, SDHB, SDHC, SDHD, SMAD4, SMARCA4, SMARCB1, SMARCE1, STK11, SUFU, TERC, TERT, TMEM127, TP53, TSC1, TSC2, VHL, WRN, WT1).  (a) Variants of Uncertain Significance were found in in PDGFRA and RECQL4.  (7) started letrozole 09/26/2019, discontinued 11/26/2019 with cognitive issues  (a) bone density May 2014 normal, T score -0.9  (b) letrozole resumed 2020-01-22, taken Mondays, Wednesday, Friday, as being off medication made no difference to either hot flashes or cognitive deficiencies  (c) letrozole discontinued October 2021 with multiple side effects  (8) exemestane prescribed 12/24/2020, discontinued mid March 2022 with intolerable side effect  (9) anastrozole started 03/15/2021, held mid June 2022 with concerns regarding cognitive dysfunction and severe arthralgias/myalgias, resumed August 2022, with similar symptoms, then decreased to Monday Wednesday Friday only as of September 2022, with improved tolerance.   PLAN:  She is currently on anastrozole, takes 3 out of 7 days, this is the best she can tolerate.  Please refer to the above-mentioned history.  She has tried all available antiestrogen options.  Because of several interruptions although she started her medication in October 2019, I recommended that she consider taking it till early 2025.  She was not quite happy with this recommendation, she was hoping to finish it in 2024.  We discussed that we can evaluate her tolerance from time to time and figure out the exact stop date. With regards to the pain in the left axilla in the area of  firmness, I have recommended an ultrasound and have engaged in nurse navigators to help her schedule this.  This could be most likely scar tissue given her history of keloid formation in the past however since this is the first time examining this area,  I am not able to quite discern if this is her chronic finding versus new.  She also mentions that this has been hurting more than when she last saw Dr. Jana Hakim so she was hoping to get this evaluated further. At this point however the plan is to continue anastrozole as currently prescribed for a minimum of 5 years. We have discussed the importance of antiestrogen therapy and invasive lobular carcinoma and the need to take it for at least 5 years or longer if well-tolerated. Patient also had some questions regarding the types of antiestrogen therapies, choice of antiestrogen therapy for different patient.  I have answered all the questions to the best of my knowledge.  She will continue annual mammograms if everything on the ultrasound is unremarkable. Bone health: Bone density in March 2022 showed osteopenia, continue vitamin D and weightbearing exercises   Telephone appointment in a couple weeks to review the ultrasound results. In person appointment in 6 months.  Total encounter time: 40 minutes, this is a new patient to me, transitioning from Dr. Jana Hakim upon his retirement.  *Total Encounter Time as defined by the Centers for Medicare and Medicaid Services includes, in addition to the face-to-face time of a patient visit (documented in the note above) non-face-to-face time: obtaining and reviewing outside history, ordering and reviewing medications, tests or procedures, care coordination (communications with other health care professionals or caregivers) and documentation in the medical record.

## 2022-02-04 ENCOUNTER — Other Ambulatory Visit: Payer: Self-pay | Admitting: Hematology and Oncology

## 2022-02-04 DIAGNOSIS — C50412 Malignant neoplasm of upper-outer quadrant of left female breast: Secondary | ICD-10-CM

## 2022-02-10 ENCOUNTER — Ambulatory Visit
Admission: RE | Admit: 2022-02-10 | Discharge: 2022-02-10 | Disposition: A | Discharge status: Home / Self Care | Payer: Medicare Other | Source: Ambulatory Visit | Attending: Hematology and Oncology | Admitting: Hematology and Oncology

## 2022-02-10 DIAGNOSIS — R922 Inconclusive mammogram: Secondary | ICD-10-CM | POA: Diagnosis not present

## 2022-02-10 DIAGNOSIS — C50412 Malignant neoplasm of upper-outer quadrant of left female breast: Secondary | ICD-10-CM

## 2022-02-10 DIAGNOSIS — N644 Mastodynia: Secondary | ICD-10-CM | POA: Diagnosis not present

## 2022-02-21 ENCOUNTER — Telehealth: Payer: Self-pay

## 2022-02-21 NOTE — Telephone Encounter (Signed)
Pt called and states she would like her care to be transferred to Dr Lindi Adie going forward. She states a family friend recommended Dr Lindi Adie. Per MD, OK to establish. Message sent to scheduling to r/s all pt's appts. Pt verbalized thanks & understanding. ?

## 2022-02-23 ENCOUNTER — Telehealth: Payer: Self-pay | Admitting: Hematology and Oncology

## 2022-02-23 NOTE — Telephone Encounter (Signed)
Sch per 3/13 inbasket, prov request to r/s from [place] to [place], pt aware ?

## 2022-03-04 DIAGNOSIS — C50412 Malignant neoplasm of upper-outer quadrant of left female breast: Secondary | ICD-10-CM | POA: Diagnosis not present

## 2022-03-08 NOTE — Progress Notes (Signed)
? ? ?Patient Care Team: ?Shon Baton, MD as PCP - General ?Rolm Bookbinder, MD as Consulting Physician (General Surgery) ?Eppie Gibson, MD as Attending Physician (Radiation Oncology) ?Lelon Perla, MD as Consulting Physician (Cardiology) ?[name], MD as Consulting Physician (Obstetrics and Gynecology) ?Jacquelynn Cree, PT as Physical Therapist (Physical Therapy) ?Donzetta Sprung., MD as Referring Physician (Sports Medicine) ?Caryl Comes, MD as Physician Assistant (Neurosurgery) ?Benay Pike, MD as Consulting Physician (Hematology and Oncology) ? ?DIAGNOSIS:  ?Encounter Diagnosis  ?Name Primary?  ? Malignant neoplasm of upper-outer quadrant of left breast in female, estrogen receptor positive ([place])   ? ? ?CHIEF COMPLIANT: Estrogen receptor positive breast cancer on anastrozole ? ?INTERVAL HISTORY: [name] is a 76 y.o. with above mention Estrogen receptor positive breast cancer. She presents to the clinic today for a follow-up. She tolerating the anastrozole. She complains of memory loss. She complains of fatigue feeling tired a lot. Complains of pain in breast.  ? ? ?ALLERGIES:  is allergic to oxycontin [oxycodone hcl] and codeine. ? ?MEDICATIONS:  ?Current Outpatient Medications  ?Medication Sig Dispense Refill  ? anastrozole (ARIMIDEX) 1 MG tablet Take 1 mg by mouth daily. She takes it 3 days a week    ? aspirin 81 MG EC tablet Take by mouth.    ? azelastine (ASTELIN) 0.1 % nasal spray Place 1 spray into both nostrils 2 (two) times daily. Use in each nostril as directed    ? buPROPion (WELLBUTRIN XL) 150 MG 24 hr tablet Take by mouth.    ? Cholecalciferol (VITAMIN D3) 1000 units CAPS Take 1 capsule by mouth 2 (two) times daily.     ? cycloSPORINE (RESTASIS) 0.05 % ophthalmic emulsion Apply to eye.    ? cycloSPORINE, PF, (CEQUA) 0.09 % SOLN     ? diazepam (VALIUM) 5 MG tablet Take one tablet po one hour prior to procedure. Must have a driver. 1 tablet 0  ? diclofenac Sodium  (VOLTAREN) 1 % GEL diclofenac 1 % topical gel ? apply 2 grams to affected area two to three times a day    ? escitalopram (LEXAPRO) 10 MG tablet Take by mouth.    ? esomeprazole (NEXIUM) 40 MG capsule Take 40 mg by mouth 2 (two) times daily before a meal.    ? ezetimibe (ZETIA) 10 MG tablet Take 10 mg by mouth at bedtime.    ? fluticasone (FLONASE) 50 MCG/ACT nasal spray Place 1 spray into both nostrils daily.    ? Fluticasone Propionate, Inhal, 50 MCG/ACT AEPB Inhale into the lungs.    ? levothyroxine (SYNTHROID, LEVOTHROID) 50 MCG tablet Take 50 mcg by mouth at bedtime.    ? linaclotide (LINZESS) 290 MCG CAPS capsule     ? Magnesium 250 MG TABS     ? naproxen sodium (ALEVE) 220 MG tablet Take 220 mg by mouth 2 (two) times daily as needed (pain).    ? ondansetron (ZOFRAN-ODT) 8 MG disintegrating tablet     ? OVER THE COUNTER MEDICATION Apply 1 application topically daily as needed (pain). Natural pain relief ointment    ? predniSONE (STERAPRED UNI-PAK 21 TAB) 10 MG (21) TBPK tablet     ? Probiotic Product (PROBIOTIC ADVANCED PO) Take 1 tablet by mouth daily.    ? rosuvastatin (CRESTOR) 5 MG tablet Take 5 mg by mouth at bedtime.     ? sodium chloride (OCEAN) 0.65 % SOLN nasal spray Place 4 sprays into both nostrils 4 (four) times daily.    ?  SUPER B COMPLEX/C PO Take 1 tablet by mouth daily.    ? traMADol (ULTRAM) 50 MG tablet     ? tretinoin (RETIN-A) 0.1 % cream     ? UNABLE TO FIND Med Name: CBD Cream    ? zolpidem (AMBIEN) 10 MG tablet zolpidem 10 mg tablet ? take 1 tablet by mouth at bedtime if needed    ? ?No current facility-administered medications for this visit.  ? ? ?PHYSICAL EXAMINATION: ?ECOG PERFORMANCE STATUS: 1 - Symptomatic but completely ambulatory ? ?Vitals:  ? 03/09/22 0933  ?BP: 125/78  ?Pulse: 90  ?Resp: 18  ?Temp: (!) 97.3 ?F (36.3 ?C)  ?SpO2: 96%  ? ?Filed Weights  ? 03/09/22 0933  ?Weight: 158 lb 4.8 oz (71.8 kg)  ? ?  ? ?LABORATORY DATA:  ?I have reviewed the data as listed ? ?  Latest Ref  Rng & Units 02/03/2022  ?  2:19 PM 09/02/2021  ? 12:31 PM 06/02/2021  ?  3:26 PM  ?CMP  ?Glucose 70 - 99 mg/dL 97   110   113    ?BUN 8 - 23 mg/dL '17   15   14    '$ ?Creatinine 0.44 - 1.00 mg/dL 0.77   0.76   0.78    ?Sodium 135 - 145 mmol/L 141   141   141    ?Potassium 3.5 - 5.1 mmol/L 4.2   4.4   4.0    ?Chloride 98 - 111 mmol/L 107   107   107    ?CO2 22 - 32 mmol/L '30   26   25    '$ ?Calcium 8.9 - 10.3 mg/dL 10.2   10.0   9.6    ?Total Protein 6.5 - 8.1 g/dL 6.4   6.6   6.5    ?Total Bilirubin 0.3 - 1.2 mg/dL 0.6   0.6   0.5    ?Alkaline Phos 38 - 126 U/L 90   101   86    ?AST 15 - 41 U/L 57   39   39    ?ALT 0 - 44 U/L 49   31   32    ? ? ?Lab Results  ?Component Value Date  ? WBC 7.7 02/03/2022  ? HGB 12.9 02/03/2022  ? HCT 39.7 02/03/2022  ? MCV 91.5 02/03/2022  ? PLT 202 02/03/2022  ? NEUTROABS 3.4 02/03/2022  ? ? ?ASSESSMENT & PLAN:  ?Malignant neoplasm of upper-outer quadrant of left breast in female, estrogen receptor positive ([place]) ?03/08/2018: Left Breast:  Grade 1-2 IDC, ER 100% and PR 100%, Ki 67 < 1%, Her 2 Neg Ratio 1.61; Additional Bx: Grade 2 ILC ER/PR Pos Her 2 Neg, Ki 1-5% ?04/17/2018: Bil lumpectomies: RT: ALH, Left: Grade 2 ILC, margins cleared with additional surgery ?Oncotype 14 (ROR 4%) ?07/09/2018 - 08/03/2018 : XRT ?09/11/2018- June 2020: Tamoxifen (cognitive issues) ?Genetics Neg ?09/26/2019- 11/26/2019: Letrozole )cognitive issues) ?12/24/2020- 02/2021: Exemestane discontinued due to AE ? ? ?Current Treatment: Anastrozole started 07/15/2021 (3 days a week) ?Anastrozole side effects: ?1. Cognitive dysfn ?2. Athralgias ?She thinks she can handle 3 times a week anastrozole fairly well. ?I recommended obtaining breast cancer index to determine if she would benefit from extended endocrine therapy.  I will call her with the result of this test. ? ?Breast cancer surveillance: ?1.  Breast exam 03/09/2022: Benign ?2. bilateral mammogram 01/04/2022: Benign breast density category D ?3.  Left breast mammogram  and ultrasound 02/10/2022: Benign breast density category D (was done  for left axillary firmness and tenderness to palpation) ? ?Return back in 6 months for follow-up and after that we can see her once a year. ? ? ?No orders of the defined types were placed in this encounter. ? ?The patient has a good understanding of the overall plan. she agrees with it. she will call with any problems that may develop before the next visit here. ?Total time spent: 30 mins including face to face time and time spent for planning, charting and co-ordination of care ? ? Harriette Ohara, MD ?03/09/22 ? ?I Gardiner Coins am scribing for Dr. Lindi Adie ? ?I have reviewed the above documentation for accuracy and completeness, and I agree with the above. ?  ?  ?

## 2022-03-09 ENCOUNTER — Encounter: Payer: Self-pay | Admitting: *Deleted

## 2022-03-09 ENCOUNTER — Other Ambulatory Visit: Payer: Self-pay

## 2022-03-09 ENCOUNTER — Inpatient Hospital Stay: Payer: Medicare Other | Admitting: Hematology and Oncology

## 2022-03-09 ENCOUNTER — Inpatient Hospital Stay: Payer: Medicare Other | Attending: Oncology | Admitting: Hematology and Oncology

## 2022-03-09 DIAGNOSIS — Z79811 Long term (current) use of aromatase inhibitors: Secondary | ICD-10-CM | POA: Diagnosis not present

## 2022-03-09 DIAGNOSIS — C50412 Malignant neoplasm of upper-outer quadrant of left female breast: Secondary | ICD-10-CM | POA: Diagnosis not present

## 2022-03-09 DIAGNOSIS — Z170 Estrogen receptor positive status [ER+]: Secondary | ICD-10-CM | POA: Diagnosis not present

## 2022-03-09 NOTE — Assessment & Plan Note (Signed)
03/08/2018: Left Breast:  Grade 1-2 IDC, ER 100% and PR 100%, Ki 67 < 1%, Her 2 Neg Ratio 1.61; Additional Bx: Grade 2 ILC ER/PR Pos Her 2 Neg, Ki 1-5% ?04/17/2018: Bil lumpectomies: RT: ALH, Left: Grade 2 ILC, margins cleared with additional surgery ?Oncotype 14 (ROR 4%) ?07/09/2018 - 08/03/2018?: XRT ?09/11/2018- June 2020: Tamoxifen (cognitive issues) ?Genetics Neg ?09/26/2019- 11/26/2019: Letrozole )cognitive issues) ?12/24/2020- 02/2021: Exemestane discontinued due to AE ? ? ?Current Treatment: Anastrozole started 03/15/2021 (3 days a week) ?Anastrozole side effects: ?1. Cognitive dysfn ?2. Athralgias ? ?Breast cancer surveillance: ?1.  Breast exam 03/09/2022: Benign ?2. bilateral mammogram 01/04/2022: Benign breast density category D ?3.  Left breast mammogram and ultrasound 02/10/2022: Benign breast density category D (was done for left axillary firmness and tenderness to palpation) ? ?Return back in 1 year for follow-up ?

## 2022-03-09 NOTE — Progress Notes (Signed)
Per MD request RN successfully faxed order for [facility] testing [phone]). ?

## 2022-03-28 NOTE — Progress Notes (Deleted)
Cardiology Clinic Note   Patient Name: [name] Date of Encounter: 03/28/2022  Primary Care Provider:  Shon Baton, MD Primary Cardiologist:  None  Patient Profile    [name] 76 year old female presents to the clinic today for follow-up evaluation of her hyperlipidemia and dizziness.  Past Medical History    Past Medical History:  Diagnosis Date   Arthritis    hands and knees   Complication of anesthesia    aspiration pna; following a colonoscopy, pt requests head of bed elevated if possible.   Constipation    Cough    Depression    Dysrhythmia    RBBB on 06-06-17 ekg    Elevated liver function tests    Esophageal stricture    Family history of breast cancer    Gallstones    Genetic testing 04/02/2018   STAT Breast panel with reflex to Multi-Cancer panel (83 genes) @ [facility] - No pathogenic mutations detected   GERD (gastroesophageal reflux disease)    Hiatal hernia    History of kidney stones    History of radiation therapy 07/09/2018- 08/03/18   Left Breast/ 40.05 Gy in 15 fractions. Left Breast boost 10 Gy in 5 fractions.    Hyperlipemia    Hypothyroidism    Insomnia    Malignant neoplasm of upper-outer quadrant of left female breast ([place])    Nephrolithiasis    Personal history of radiation therapy    Pneumonia 2015   aspirated after colonosopy   Renal cyst    Past Surgical History:  Procedure Laterality Date   BREAST BIOPSY Left 05/31/2019   mri   BREAST BIOPSY Left 04/2019   malignant   BREAST LUMPECTOMY Left 04/17/2018   malignant   BREAST LUMPECTOMY WITH RADIOACTIVE SEED AND SENTINEL LYMPH NODE BIOPSY Left 04/17/2018   Procedure: LEFT BREAST LUMPECTOMY WITH BRACKETED RADIOACTIVE SEEDS AND SENTINEL LYMPH NODE BIOPSY;  Surgeon: Rolm Bookbinder, MD;  Location: [facility];  Service: General;  Laterality: Left;   BREAST SURGERY Left    Lumpectomy   CARDIOVASCULAR STRESS TEST  07/19/2006   EF 70%, NO EVIDENCE OF ISCHEMIA    CHOLECYSTECTOMY  12/30/10   COLONOSCOPY     DILATION AND CURETTAGE OF UTERUS     HYSTEROSCOPY WITH D & C N/A 06/07/2018   Procedure: DILATATION AND CURETTAGE /HYSTEROSCOPY;  Surgeon: [name], MD;  Location: [facility];  Service: Gynecology;  Laterality: N/A;   inguinal herniography     NASAL SINUS SURGERY     RADIOACTIVE SEED GUIDED EXCISIONAL BREAST BIOPSY Right 04/17/2018   Procedure: RIGHT BREAST SEED GUIDED EXCISIONAL BIOPSY;  Surgeon: Rolm Bookbinder, MD;  Location: [facility];  Service: General;  Laterality: Right;   RE-EXCISION OF BREAST LUMPECTOMY Left 05/15/2018   Procedure: RE-EXCISION OF LEFT BREAST LUMPECTOMY, ASPIRATION OF LEFT AXILLARY SEROMA;  Surgeon: Rolm Bookbinder, MD;  Location: [facility];  Service: General;  Laterality: Left;   T & A     TOOTH EXTRACTION     TOTAL KNEE ARTHROPLASTY Left 09/11/2017   Procedure: LEFT TOTAL KNEE ARTHROPLASTY;  Surgeon: Gaynelle Arabian, MD;  Location: [facility];  Service: Orthopedics;  Laterality: Left;  with block   US ECHOCARDIOGRAPHY  07/17/2006   EF 55-60%    Allergies  Allergies  Allergen Reactions   Oxycontin [Oxycodone Hcl]     "it made me feel very high and hyper"    Codeine Nausea And Vomiting    History of Present Illness  [name] has a PMH of chest pain, nonspecific EKG changes, dizziness, and hyperlipidemia.  Her echocardiogram 12/17 showed normal LV function and G1 DD.  She was noted to have orthostatic symptoms during previous cardiology visits.  Her nuclear stress test 6/18 showed ejection fraction of 64% and no ischemia.  Her carotid Doppler 7/18 showed 1-39% left carotid stenosis.  She was seen by Dr. Stanford Breed 08/04/2020.  During that time she denied dyspnea, chest pain, palpitations, and syncope.  She did note occasional dizziness with standing.  She presents to the clinic today for follow-up evaluation and states***  *** denies chest pain, shortness of breath, lower extremity  edema, fatigue, palpitations, melena, hematuria, hemoptysis, diaphoresis, weakness, presyncope, syncope, orthopnea, and PND.   Home Medications    Prior to Admission medications   Medication Sig Start Date End Date Taking? Authorizing Provider  anastrozole (ARIMIDEX) 1 MG tablet Take 1 mg by mouth daily. She takes it 3 days a week    [provider]  aspirin 81 MG EC tablet Take by mouth.    [provider]  azelastine (ASTELIN) 0.1 % nasal spray Place 1 spray into both nostrils 2 (two) times daily. Use in each nostril as directed    [provider]  buPROPion (WELLBUTRIN XL) 150 MG 24 hr tablet Take by mouth.    [provider]  Cholecalciferol (VITAMIN D3) 1000 units CAPS Take 1 capsule by mouth 2 (two) times daily.     [provider]  cycloSPORINE (RESTASIS) 0.05 % ophthalmic emulsion Apply to eye.    [provider]  cycloSPORINE, PF, (CEQUA) 0.09 % SOLN  08/26/20   [provider]  diazepam (VALIUM) 5 MG tablet Take one tablet po one hour prior to procedure. Must have a driver. 12/15/21   Kirsteins, Luanna Salk, MD  diclofenac Sodium (VOLTAREN) 1 % GEL diclofenac 1 % topical gel  apply 2 grams to affected area two to three times a day    [provider]  escitalopram (LEXAPRO) 10 MG tablet Take by mouth.    [provider]  esomeprazole (NEXIUM) 40 MG capsule Take 40 mg by mouth 2 (two) times daily before a meal.    [provider]  ezetimibe (ZETIA) 10 MG tablet Take 10 mg by mouth at bedtime.    [provider]  fluticasone (FLONASE) 50 MCG/ACT nasal spray Place 1 spray into both nostrils daily.    [provider]  Fluticasone Propionate, Inhal, 50 MCG/ACT AEPB Inhale into the lungs.    [provider]  levothyroxine (SYNTHROID, LEVOTHROID) 50 MCG tablet Take 50 mcg by mouth at bedtime.    [provider]  linaclotide Rolan Lipa) 290 MCG CAPS capsule  11/24/20    [provider]  Magnesium 250 MG TABS     [provider]  naproxen sodium (ALEVE) 220 MG tablet Take 220 mg by mouth 2 (two) times daily as needed (pain).    [provider]  ondansetron (ZOFRAN-ODT) 8 MG disintegrating tablet  01/25/21   [provider]  OVER THE COUNTER MEDICATION Apply 1 application topically daily as needed (pain). Natural pain relief ointment    [provider]  predniSONE (STERAPRED UNI-PAK 21 TAB) 10 MG (21) TBPK tablet  07/21/20   [provider]  Probiotic Product (PROBIOTIC ADVANCED PO) Take 1 tablet by mouth daily.    [provider]  rosuvastatin (CRESTOR) 5 MG tablet Take 5 mg by mouth at bedtime.  [provider]  sodium chloride (OCEAN) 0.65 % SOLN nasal spray Place 4 sprays into both nostrils 4 (four) times daily.    [provider]  SUPER B COMPLEX/C PO Take 1 tablet by mouth daily.    [provider]  traMADol Veatrice Bourbon) 50 MG tablet  01/25/21   [provider]  tretinoin (RETIN-A) 0.1 % cream  03/02/21   [provider]  UNABLE TO FIND Med Name: CBD Cream    [provider]  zolpidem (AMBIEN) 10 MG tablet zolpidem 10 mg tablet  take 1 tablet by mouth at bedtime if needed    [provider]    Family History    Family History  Problem Relation Age of Onset   Breast cancer Mother 48       deceased 20; TAH/BSO in 45s/50s   Dementia Father        deceased 21   Breast cancer Sister    Breast cancer Maternal Aunt 82       deceased 58s   Colon cancer Neg Hx    Stomach cancer Neg Hx    She indicated that her mother is deceased. She indicated that her father is deceased. She indicated that her sister is alive. She indicated that her brother is alive. She indicated that her maternal grandmother is deceased. She indicated that her maternal grandfather is deceased. She indicated that her paternal grandmother is deceased. She indicated that  her paternal grandfather is deceased. She indicated that her maternal aunt is deceased. She indicated that the status of her neg hx is unknown.  Social History    Social History   Socioeconomic History   Marital status: Married    Spouse name: Not on file   Number of children: 2   Years of education: Not on file   Highest education level: Not on file  Occupational History   Occupation: Retired    Fish farm manager: [employer]  Tobacco Use   Smoking status: Never   Smokeless tobacco: Never  Vaping Use   Vaping Use: Never used  Substance and Sexual Activity   Alcohol use: No   Drug use: No   Sexual activity: Not on file  Other Topics Concern   Not on file  Social History Narrative   Not on file   Social Determinants of Health   Financial Resource Strain: Not on file  Food Insecurity: Not on file  Transportation Needs: Not on file  Physical Activity: Not on file  Stress: Not on file  Social Connections: Not on file  Intimate Partner Violence: Not on file     Review of Systems    General:  No chills, fever, night sweats or weight changes.  Cardiovascular:  No chest pain, dyspnea on exertion, edema, orthopnea, palpitations, paroxysmal nocturnal dyspnea. Dermatological: No rash, lesions/masses Respiratory: No cough, dyspnea Urologic: No hematuria, dysuria Abdominal:   No nausea, vomiting, diarrhea, bright red blood per rectum, melena, or hematemesis Neurologic:  No visual changes, wkns, changes in mental status. All other systems reviewed and are otherwise negative except as noted above.  Physical Exam    VS:  There were no vitals taken for this visit. , BMI There is no height or weight on file to calculate BMI. GEN: Well nourished, well developed, in no acute distress. HEENT: normal. Neck: Supple, no JVD, carotid bruits, or masses. Cardiac: RRR, no murmurs, rubs, or gallops. No clubbing, cyanosis, edema.  Radials/DP/PT 2+ and equal bilaterally.  Respiratory:  Respirations  regular  and unlabored, clear to auscultation bilaterally. GI: Soft, nontender, nondistended, BS + x 4. MS: no deformity or atrophy. Skin: warm and dry, no rash. Neuro:  Strength and sensation are intact. Psych: Normal affect.  Accessory Clinical Findings    Recent Labs: 02/03/2022: ALT 49; BUN 17; Creatinine, Ser 0.77; Hemoglobin 12.9; Platelets 202; Potassium 4.2; Sodium 141   Recent Lipid Panel    Component Value Date/Time   CHOL 133 08/08/2011 0913   TRIG 151.0 (H) 08/08/2011 0913   HDL 43.10 08/08/2011 0913   CHOLHDL 3 08/08/2011 0913   VLDL 30.2 08/08/2011 0913   LDLCALC 60 08/08/2011 0913    ECG personally reviewed by me today- *** - No acute changes  EKG 08/04/2020 Sinus rhythm 100 bpm, incomplete RBBB, inferior lateral infarct, no acute changes from 08/2019  Assessment & Plan   1.  Orthostasis-no recent episodes of presyncope, syncope, or dizziness. Maintain p.o. hydration-greater than 64 ounces daily Maintain increased sodium Lower extremity support stockings Pause before ambulation after moving from seated to standing position  History of chest discomfort-also noted to have abnormal EKG.  Denies recent episodes of chest discomfort.  Underwent stress test which was negative and showed normal LVEF. Continue to monitor. No plans for ischemic evaluation. Continue aspirin  Hyperlipidemia-LDL***.  Reports compliance with rosuvastatin. Continue aspirin, rosuvastatin Heart healthy low-sodium high-fiber diet Increase physical activity as tolerated Follows with PCP  Disposition: Follow-up with Dr. Stanford Breed or me in 12 months and as needed.   Jossie Ng. Kamilo Och NP-C    03/28/2022, 9:29 AM [place] [address] Office [phone] Fax [phone]  Notice: This dictation was prepared with Dragon dictation along with smaller phrase technology. Any transcriptional errors that result from this process are unintentional and may  not be corrected upon review.  I spent***minutes examining this patient, reviewing medications, and using patient centered shared decision making involving her cardiac care.  Prior to her visit I spent greater than 20 minutes reviewing her past medical history,  medications, and prior cardiac tests.

## 2022-03-29 ENCOUNTER — Ambulatory Visit: Payer: Medicare Other | Admitting: General Practice

## 2022-03-29 DIAGNOSIS — Z170 Estrogen receptor positive status [ER+]: Secondary | ICD-10-CM | POA: Diagnosis not present

## 2022-03-29 DIAGNOSIS — C50412 Malignant neoplasm of upper-outer quadrant of left female breast: Secondary | ICD-10-CM | POA: Diagnosis not present

## 2022-03-31 NOTE — Progress Notes (Signed)
? ?Cardiology Clinic Note  ? ?Patient Name: [name] ?Date of Encounter: 04/04/2022 ? ?Primary Care Provider:  Shon Baton, MD ?Primary Cardiologist:  Kirk Ruths, MD ? ?Patient Profile  ?  ?[name] 76 year old female presents to the clinic today for follow-up evaluation of her hyperlipidemia and dizziness. ? ?Past Medical History  ?  ?Past Medical History:  ?Diagnosis Date  ? Arthritis   ? hands and knees  ? Complication of anesthesia   ? aspiration pna; following a colonoscopy, pt requests head of bed elevated if possible.  ? Constipation   ? Cough   ? Depression   ? Dysrhythmia   ? RBBB on 06-06-17 ekg   ? Elevated liver function tests   ? Esophageal stricture   ? Family history of breast cancer   ? Gallstones   ? Genetic testing 04/02/2018  ? STAT Breast panel with reflex to Multi-Cancer panel (83 genes) @ [facility] - No pathogenic mutations detected  ? GERD (gastroesophageal reflux disease)   ? Hiatal hernia   ? History of kidney stones   ? History of radiation therapy 07/09/2018- 08/03/18  ? Left Breast/ 40.05 Gy in 15 fractions. Left Breast boost 10 Gy in 5 fractions.   ? Hyperlipemia   ? Hypothyroidism   ? Insomnia   ? Malignant neoplasm of upper-outer quadrant of left female breast ([place])   ? Nephrolithiasis   ? Personal history of radiation therapy   ? Pneumonia 2015  ? aspirated after colonosopy  ? Renal cyst   ? ?Past Surgical History:  ?Procedure Laterality Date  ? BREAST BIOPSY Left 05/31/2019  ? mri  ? BREAST BIOPSY Left 04/2019  ? malignant  ? BREAST LUMPECTOMY Left 04/17/2018  ? malignant  ? BREAST LUMPECTOMY WITH RADIOACTIVE SEED AND SENTINEL LYMPH NODE BIOPSY Left 04/17/2018  ? Procedure: LEFT BREAST LUMPECTOMY WITH BRACKETED RADIOACTIVE SEEDS AND SENTINEL LYMPH NODE BIOPSY;  Surgeon: Rolm Bookbinder, MD;  Location: [place];  Service: General;  Laterality: Left;  ? BREAST SURGERY Left   ? Lumpectomy  ? CARDIOVASCULAR STRESS TEST  07/19/2006  ? EF 70%, NO EVIDENCE OF  ISCHEMIA  ? CHOLECYSTECTOMY  12/30/10  ? COLONOSCOPY    ? DILATION AND CURETTAGE OF UTERUS    ? HYSTEROSCOPY WITH D & C N/A 06/07/2018  ? Procedure: DILATATION AND CURETTAGE /HYSTEROSCOPY;  Surgeon: [name], MD;  Location: [facility];  Service: Gynecology;  Laterality: N/A;  ? inguinal herniography    ? NASAL SINUS SURGERY    ? RADIOACTIVE SEED GUIDED EXCISIONAL BREAST BIOPSY Right 04/17/2018  ? Procedure: RIGHT BREAST SEED GUIDED EXCISIONAL BIOPSY;  Surgeon: Rolm Bookbinder, MD;  Location: [place];  Service: General;  Laterality: Right;  ? RE-EXCISION OF BREAST LUMPECTOMY Left 05/15/2018  ? Procedure: RE-EXCISION OF LEFT BREAST LUMPECTOMY, ASPIRATION OF LEFT AXILLARY SEROMA;  Surgeon: Rolm Bookbinder, MD;  Location: [place];  Service: General;  Laterality: Left;  ? T & A    ? TOOTH EXTRACTION    ? TOTAL KNEE ARTHROPLASTY Left 09/11/2017  ? Procedure: LEFT TOTAL KNEE ARTHROPLASTY;  Surgeon: Gaynelle Arabian, MD;  Location: [facility];  Service: Orthopedics;  Laterality: Left;  with block  ? US ECHOCARDIOGRAPHY  07/17/2006  ? EF 55-60%  ? ? ?Allergies ? ?Allergies  ?Allergen Reactions  ? Oxycontin [Oxycodone Hcl]   ?  "it made me feel very high and hyper"   ? Codeine Nausea And Vomiting  ? ? ?History of  Present Illness  ?  ?[name] has a PMH of chest pain, nonspecific EKG changes, dizziness, breast CA, and hyperlipidemia.  Her echocardiogram 12/17 showed normal LV function and G1 DD.  She was noted to have orthostatic symptoms during previous cardiology visits.  Her nuclear stress test 6/18 showed ejection fraction of 64% and no ischemia.  Her carotid Doppler 7/18 showed 1-39% left carotid stenosis.  She was seen by Dr. Stanford Breed 08/04/2020.  During that time she denied dyspnea, chest pain, palpitations, and syncope.  She did note occasional dizziness with standing. ?  ?She presents to the clinic today for follow-up evaluation and states she is limited in her mobility and has  not been as physically active due to sciatica in her right leg.  She continues to have cancer treatment.  She has recently started taking her anastrozole 3 days/week instead of 7.  She feels that her memory has improved.  She denies symptoms of heart failure.  We reviewed her previous stress testing.  Both she and her husband expressed understanding.  She did have an episode of global amnesia in November last year.  This was at [facility]  We reviewed the CT report.  I will have her increase her physical activity as tolerated, continue her current medication regimen, and continue heart healthy low-sodium diet.  We will plan follow-up for 12 months. ?  ?Today she denies chest pain, shortness of breath, lower extremity edema, fatigue, palpitations, melena, hematuria, hemoptysis, diaphoresis, weakness, presyncope, syncope, orthopnea, and PND. ? ? ?Home Medications  ?  ?Prior to Admission medications   ?Medication Sig Start Date End Date Taking? Authorizing Provider  ?anastrozole (ARIMIDEX) 1 MG tablet Take 1 mg by mouth daily. She takes it 3 days a week    [provider]  ?aspirin 81 MG EC tablet Take by mouth.    [provider]  ?azelastine (ASTELIN) 0.1 % nasal spray Place 1 spray into both nostrils 2 (two) times daily. Use in each nostril as directed    [provider]  ?buPROPion (WELLBUTRIN XL) 150 MG 24 hr tablet Take by mouth.    [provider]  ?Cholecalciferol (VITAMIN D3) 1000 units CAPS Take 1 capsule by mouth 2 (two) times daily.     [provider]  ?cycloSPORINE (RESTASIS) 0.05 % ophthalmic emulsion Apply to eye.    [provider]  ?cycloSPORINE, PF, (CEQUA) 0.09 % SOLN  08/26/20   [provider]  ?diazepam (VALIUM) 5 MG tablet Take one tablet po one hour prior to procedure. Must have a driver. 12/15/21   Kirsteins, Luanna Salk, MD  ?diclofenac Sodium (VOLTAREN) 1 % GEL diclofenac 1 % topical gel ? apply 2 grams to affected area two to three times  a day    [provider]  ?escitalopram (LEXAPRO) 10 MG tablet Take by mouth.    [provider]  ?esomeprazole (NEXIUM) 40 MG capsule Take 40 mg by mouth 2 (two) times daily before a meal.    [provider]  ?ezetimibe (ZETIA) 10 MG tablet Take 10 mg by mouth at bedtime.    [provider]  ?fluticasone (FLONASE) 50 MCG/ACT nasal spray Place 1 spray into both nostrils daily.    [provider]  ?Fluticasone Propionate, Inhal, 50 MCG/ACT AEPB Inhale into the lungs.    [provider]  ?levothyroxine (SYNTHROID, LEVOTHROID) 50 MCG tablet Take 50 mcg by mouth at bedtime.    [provider]  ?linaclotide Rolan Lipa) 290 MCG  CAPS capsule  11/24/20   [provider]  ?Magnesium 250 MG TABS     [provider]  ?naproxen sodium (ALEVE) 220 MG tablet Take 220 mg by mouth 2 (two) times daily as needed (pain).    [provider]  ?ondansetron (ZOFRAN-ODT) 8 MG disintegrating tablet  01/25/21   [provider]  ?OVER THE COUNTER MEDICATION Apply 1 application topically daily as needed (pain). Natural pain relief ointment    [provider]  ?predniSONE (STERAPRED UNI-PAK 21 TAB) 10 MG (21) TBPK tablet  07/21/20   [provider]  ?Probiotic Product (PROBIOTIC ADVANCED PO) Take 1 tablet by mouth daily.    [provider]  ?rosuvastatin (CRESTOR) 5 MG tablet Take 5 mg by mouth at bedtime.     [provider]  ?sodium chloride (OCEAN) 0.65 % SOLN nasal spray Place 4 sprays into both nostrils 4 (four) times daily.    [provider]  ?SUPER B COMPLEX/C PO Take 1 tablet by mouth daily.    [provider]  ?traMADol Veatrice Bourbon) 50 MG tablet  01/25/21   [provider]  ?tretinoin (RETIN-A) 0.1 % cream  03/02/21   [provider]  ?[place] Name: CBD Cream    [provider]  ?zolpidem (AMBIEN) 10 MG tablet zolpidem 10 mg tablet ? take 1 tablet by mouth  at bedtime if needed    [provider]  ? ? ?Family History  ?  ?Family History  ?Problem Relation Age of Onset  ? Breast cancer Mother 66  ?     deceased 48; TAH/BSO in 61s/50s  ? Dementia F

## 2022-04-04 ENCOUNTER — Ambulatory Visit (INDEPENDENT_AMBULATORY_CARE_PROVIDER_SITE_OTHER): Payer: Medicare Other | Admitting: General Practice

## 2022-04-04 ENCOUNTER — Encounter: Payer: Self-pay | Admitting: General Practice

## 2022-04-04 ENCOUNTER — Ambulatory Visit: Payer: Medicare Other | Admitting: General Practice

## 2022-04-04 VITALS — BP 110/66 | HR 74 | Ht 68.0 in | Wt 159.4 lb

## 2022-04-04 DIAGNOSIS — R42 Dizziness and giddiness: Secondary | ICD-10-CM | POA: Diagnosis not present

## 2022-04-04 DIAGNOSIS — R0789 Other chest pain: Secondary | ICD-10-CM

## 2022-04-04 DIAGNOSIS — E7800 Pure hypercholesterolemia, unspecified: Secondary | ICD-10-CM | POA: Diagnosis not present

## 2022-04-04 NOTE — Patient Instructions (Signed)
Medication Instructions:  ?The current medical regimen is effective;  continue present plan and medications as directed. Please refer to the Current Medication list given to you today.  ? ?*If you need a refill on your cardiac medications before your next appointment, please call your pharmacy* ? ?Lab Work:   Testing/Procedures:  ?NONE    NONE ? ?If you have labs (blood work) drawn today and your tests are completely normal, you will receive your results only by: ?MyChart Message (if you have MyChart) OR  A paper copy in the mail ?If you have any lab test that is abnormal or we need to change your treatment, we will call you to review the results. ? ?Special Instructions ?PLEASE INCREASE PHYSICAL ACTIVITY AS TOLERATED  ? ?Follow-Up: ?Your next appointment:  12 month(s) In Person with Kirk Ruths, MD  ? ?Please call our office in [place] schedule this appointment  :1 ? ?At [facility], you and your health needs are our priority.  As part of our continuing mission to provide you with exceptional heart care, we have created designated Provider Care Teams.  These Care Teams include your primary Cardiologist (physician) and Advanced Practice Providers (APPs -  Physician Assistants and Nurse Practitioners) who all work together to provide you with the care you need, when you need it. ? ? ?Important Information About Sugar ? ? ? ? ? ? ?       ?

## 2022-04-04 NOTE — Therapy (Signed)
?OUTPATIENT PHYSICAL THERAPY ONCOLOGY EVALUATION ? ?Patient Name: [name] ?MRN: [id number] ?DOB:1946-02-08, 76 y.o., female ?Today's Date: 04/05/2022 ? ? PT End of Session - 04/05/22 1341   ? ? Visit Number 1   ? Number of Visits 12   ? Date for PT Re-Evaluation 05/17/22   ? PT Start Time 1300   ? PT Stop Time 1340   ? PT Time Calculation (min) 40 min   ? Activity Tolerance Patient tolerated treatment well   ? Behavior During Therapy [facility] for tasks assessed/performed   ? ?  ?  ? ?  ? ? ?Past Medical History:  ?Diagnosis Date  ? Arthritis   ? hands and knees  ? Complication of anesthesia   ? aspiration pna; following a colonoscopy, pt requests head of bed elevated if possible.  ? Constipation   ? Cough   ? Depression   ? Dysrhythmia   ? RBBB on 06-06-17 ekg   ? Elevated liver function tests   ? Esophageal stricture   ? Family history of breast cancer   ? Gallstones   ? Genetic testing 04/02/2018  ? STAT Breast panel with reflex to Multi-Cancer panel (83 genes) @ [facility] - No pathogenic mutations detected  ? GERD (gastroesophageal reflux disease)   ? Hiatal hernia   ? History of kidney stones   ? History of radiation therapy 07/09/2018- 08/03/18  ? Left Breast/ 40.05 Gy in 15 fractions. Left Breast boost 10 Gy in 5 fractions.   ? Hyperlipemia   ? Hypothyroidism   ? Insomnia   ? Malignant neoplasm of upper-outer quadrant of left female breast ([place])   ? Nephrolithiasis   ? Personal history of radiation therapy   ? Pneumonia 2015  ? aspirated after colonosopy  ? Renal cyst   ? ?Past Surgical History:  ?Procedure Laterality Date  ? BREAST BIOPSY Left 05/31/2019  ? mri  ? BREAST BIOPSY Left 04/2019  ? malignant  ? BREAST LUMPECTOMY Left 04/17/2018  ? malignant  ? BREAST LUMPECTOMY WITH RADIOACTIVE SEED AND SENTINEL LYMPH NODE BIOPSY Left 04/17/2018  ? Procedure: LEFT BREAST LUMPECTOMY WITH BRACKETED RADIOACTIVE SEEDS AND SENTINEL LYMPH NODE BIOPSY;  Surgeon: Rolm Bookbinder, MD;  Location: [place];   Service: General;  Laterality: Left;  ? BREAST SURGERY Left   ? Lumpectomy  ? CARDIOVASCULAR STRESS TEST  07/19/2006  ? EF 70%, NO EVIDENCE OF ISCHEMIA  ? CHOLECYSTECTOMY  12/30/10  ? COLONOSCOPY    ? DILATION AND CURETTAGE OF UTERUS    ? HYSTEROSCOPY WITH D & C N/A 06/07/2018  ? Procedure: DILATATION AND CURETTAGE /HYSTEROSCOPY;  Surgeon: [name], MD;  Location: [facility];  Service: Gynecology;  Laterality: N/A;  ? inguinal herniography    ? NASAL SINUS SURGERY    ? RADIOACTIVE SEED GUIDED EXCISIONAL BREAST BIOPSY Right 04/17/2018  ? Procedure: RIGHT BREAST SEED GUIDED EXCISIONAL BIOPSY;  Surgeon: Rolm Bookbinder, MD;  Location: [place];  Service: General;  Laterality: Right;  ? RE-EXCISION OF BREAST LUMPECTOMY Left 05/15/2018  ? Procedure: RE-EXCISION OF LEFT BREAST LUMPECTOMY, ASPIRATION OF LEFT AXILLARY SEROMA;  Surgeon: Rolm Bookbinder, MD;  Location: [place];  Service: General;  Laterality: Left;  ? T & A    ? TOOTH EXTRACTION    ? TOTAL KNEE ARTHROPLASTY Left 09/11/2017  ? Procedure: LEFT TOTAL KNEE ARTHROPLASTY;  Surgeon: Gaynelle Arabian, MD;  Location: [facility];  Service: Orthopedics;  Laterality: Left;  with block  ? US  ECHOCARDIOGRAPHY  07/17/2006  ? EF 55-60%  ? ?Patient Active Problem List  ? Diagnosis Date Noted  ? Spinal stenosis of lumbar region with radiculopathy 10/12/2021  ? Chronic postoperative pain 10/12/2021  ? Breast mass, right 03/27/2020  ? Hepatic steatosis 01/22/2020  ? Genetic testing 04/02/2018  ? Family history of breast cancer   ? Malignant neoplasm of upper-outer quadrant of left breast in female, estrogen receptor positive ([place]) 03/16/2018  ? OA (osteoarthritis) of knee 09/11/2017  ? Infrapatellar bursitis of right knee 04/29/2014  ? Pneumonia, aspiration ([place]) 12/28/2012  ? HYPOTHYROIDISM 11/15/2010  ? HYPERLIPIDEMIA 11/15/2010  ? DEPRESSION 11/15/2010  ? GERD 11/15/2010  ? CONSTIPATION 11/15/2010  ? GALLSTONES 11/15/2010  ? RENAL CYST 11/15/2010   ? INSOMNIA UNSPECIFIED 11/15/2010  ? COUGH 11/15/2010  ? ABDOMINAL PAIN OTHER SPECIFIED SITE 11/15/2010  ? TRANSAMINASES, SERUM, ELEVATED 11/15/2010  ? ? ?PCP: Shon Baton, MD ? ?REFERRING PROVIDER: Rolm Bookbinder, MD ? ?REFERRING DIAG: s/p Bilateral Lumpectomies with Left upper quarter Pain/swelling ? ?THERAPY DIAG:  ?Malignant neoplasm of upper-outer quadrant of left breast in female, estrogen receptor positive ([place]) ? ?Abnormal posture ? ?Localized edema ? ?Stiffness of left shoulder, not elsewhere classified ? ?ONSET DATE: a year ago ? ?SUBJECTIVE                                                                                                                                                                                          ? ?SUBJECTIVE STATEMENT: ?Pain at left lateral chest from scar tissue, and pain with moving her arm in certain directions it feels tight.  Intermittent  sharp pains ? ?PERTINENT HISTORY:  ?Left Breast:  Grade 1-2 IDC, ER 100% and PR 100%, Ki 67 < 1%, Her 2 Neg Ratio 1.61; Additional Bx: Grade 2 ILC ER/PR Pos Her 2 Neg, Ki 1-5% ?04/17/2018: Bil lumpectomies: RT: ALH, Left: Grade 2 ILC, margins cleared with additional surgery ?Oncotype 14 (ROR 4%) ?07/09/2018 - 08/03/2018 : XRT ?09/11/2018- June 2020: Tamoxifen (cognitive issues) ?Genetics Neg ?09/26/2019- 11/26/2019: Letrozole )cognitive issues) ?12/24/2020- 02/2021: Exemestane discontinued due to AE ?Presently on Anastrazole 3x/week ?PAIN:  ?Are you having pain? Yes ?NPRS scale: 0/10 but can reach 9/10 ?Pain location: left lateral breast ?Pain orientation: Left and Lateral  ?PAIN TYPE: sharp and tight ?Pain description: intermittent  ?Aggravating factors: random sharp pains ?Relieving factors: not sure ? ?PRECAUTIONS: Other: sciatica, lymphedema risk, left TKA ? ?WEIGHT BEARING RESTRICTIONS No ? ?FALLS:  ?Has patient fallen in last 6 months? No ? ?LIVING ENVIRONMENT: ?Lives with: lives with their family and lives with their  spouse ?Lives in: House/apartment ?Stairs: Yes; Internal: 10 steps; on  right going up ?Has following equipment at home:  none ? ?OCCUPATION: retired ? ?LEISURE: reading, gardening ? ?HAND DOMINANCE : right  ? ?PRIOR LEVEL OF FUNCTION: Independent ? ?PATIENT GOALS decrease pain, tightness, swelling ? ? ?OBJECTIVE ? ?COGNITION: ? Overall cognitive status: Within functional limits for tasks assessed  ? ?PALPATION: Tender left axillary region near incision ? ?OBSERVATIONS / OTHER ASSESSMENTS: mild visible left breast swelling with fibrosis noted proximal to nipple. Healed incisions with mild thickening ? ?SENSATION: ? Light touch: Deficits mild from prior surgery ?  ? ?POSTURE: forward head, rounded shoulders, increased thoracic kyphosis ? ?UPPER EXTREMITY AROM/PROM: ? ?A/PROM RIGHT  04/05/2022 ?  ?Shoulder extension 70  ?Shoulder flexion 135  ?Shoulder abduction 150 ( slight scaption)  ?Shoulder internal rotation 78  ?Shoulder external rotation 92  ?  (Blank rows = not tested) ? ?A/PROM LEFT  04/05/2022  ?Shoulder extension 60  ?Shoulder flexion 132  ?Shoulder abduction 142(slight scaption)  ?Shoulder internal rotation 65  ?Shoulder external rotation 88  ?  (Blank rows = not tested) ? ? ?CERVICAL AROM: ?All within functional limits:  ? ? ? ? ?UPPER EXTREMITY STRENGTH: WFL ? ? ? ?LYMPHEDEMA ASSESSMENTS:  ? ?SURGERY TYPE/DATE: 04/17/2018 Bilateral lumpectomies ? ?NUMBER OF LYMPH NODES REMOVED: 3-4 on left only ? ?CHEMOTHERAPY: no ? ?RADIATION:yes ? ?HORMONE TREATMENT: yes,  ? ?INFECTIONS: no ? ?LYMPHEDEMA ASSESSMENTS:  ? ?[place] RIGHT  04/05/2022  ?10 cm proximal to olecranon process 29.7  ?Olecranon process 25.5  ?10 cm proximal to ulnar styloid process 20.1  ?Just proximal to ulnar styloid process 15  ?Across hand at thumb web space 18.0  ?At base of 2nd digit 6.3  ?(Blank rows = not tested) ? ?[place] LEFT  04/05/2022  ?10 cm proximal to olecranon process 31.2  ?Olecranon process 26.0  ?10 cm proximal to ulnar styloid  process 19.8  ?Just proximal to ulnar styloid process 15.3  ?Across hand at thumb web space 18.1  ?At base of 2nd digit 6.0  ?(Blank rows = not tested) ? ? ? ? ? ?QUICK DASH SURVEY: 11% ? ? ?TODAY'S TREATMENT  ?Pt wa

## 2022-04-05 ENCOUNTER — Ambulatory Visit: Payer: Medicare Other | Attending: General Surgery

## 2022-04-05 DIAGNOSIS — Z170 Estrogen receptor positive status [ER+]: Secondary | ICD-10-CM | POA: Diagnosis not present

## 2022-04-05 DIAGNOSIS — R600 Localized edema: Secondary | ICD-10-CM | POA: Insufficient documentation

## 2022-04-05 DIAGNOSIS — R293 Abnormal posture: Secondary | ICD-10-CM | POA: Insufficient documentation

## 2022-04-05 DIAGNOSIS — C50412 Malignant neoplasm of upper-outer quadrant of left female breast: Secondary | ICD-10-CM | POA: Insufficient documentation

## 2022-04-05 DIAGNOSIS — M25612 Stiffness of left shoulder, not elsewhere classified: Secondary | ICD-10-CM | POA: Diagnosis not present

## 2022-04-06 ENCOUNTER — Other Ambulatory Visit: Payer: Self-pay | Admitting: *Deleted

## 2022-04-06 MED ORDER — ANASTROZOLE 1 MG PO TABS: 1.0000 mg | ORAL_TABLET | ORAL | 0 refills | Status: DC

## 2022-04-13 ENCOUNTER — Ambulatory Visit: Payer: Medicare Other | Attending: General Surgery

## 2022-04-13 DIAGNOSIS — R293 Abnormal posture: Secondary | ICD-10-CM | POA: Diagnosis not present

## 2022-04-13 DIAGNOSIS — R600 Localized edema: Secondary | ICD-10-CM | POA: Diagnosis not present

## 2022-04-13 DIAGNOSIS — C50412 Malignant neoplasm of upper-outer quadrant of left female breast: Secondary | ICD-10-CM | POA: Diagnosis not present

## 2022-04-13 DIAGNOSIS — M25612 Stiffness of left shoulder, not elsewhere classified: Secondary | ICD-10-CM | POA: Diagnosis not present

## 2022-04-13 DIAGNOSIS — Z170 Estrogen receptor positive status [ER+]: Secondary | ICD-10-CM | POA: Diagnosis not present

## 2022-04-13 NOTE — Therapy (Signed)
?OUTPATIENT PHYSICAL THERAPY TREATMENT NOTE ? ? ?Patient Name: [name] ?MRN: [id number] ?DOB:12/23/1945, 76 y.o., female ?Today's Date: 04/13/2022 ? ?PCP: Shon Baton MD ?REFERRING PROVIDER: Mauricio Po MD ? ?END OF SESSION:  ? PT End of Session - 04/13/22 1457   ? ? Visit Number 2   ? Number of Visits 12   ? Date for PT Re-Evaluation 05/17/22   ? PT Start Time 1601   ? PT Stop Time 1610   ? PT Time Calculation (min) 71 min   ? Activity Tolerance Patient tolerated treatment well   ? Behavior During Therapy [facility] for tasks assessed/performed   ? ?  ?  ? ?  ? ? ?Past Medical History:  ?Diagnosis Date  ? Arthritis   ? hands and knees  ? Complication of anesthesia   ? aspiration pna; following a colonoscopy, pt requests head of bed elevated if possible.  ? Constipation   ? Cough   ? Depression   ? Dysrhythmia   ? RBBB on 06-06-17 ekg   ? Elevated liver function tests   ? Esophageal stricture   ? Family history of breast cancer   ? Gallstones   ? Genetic testing 04/02/2018  ? STAT Breast panel with reflex to Multi-Cancer panel (83 genes) @ [facility] - No pathogenic mutations detected  ? GERD (gastroesophageal reflux disease)   ? Hiatal hernia   ? History of kidney stones   ? History of radiation therapy 07/09/2018- 08/03/18  ? Left Breast/ 40.05 Gy in 15 fractions. Left Breast boost 10 Gy in 5 fractions.   ? Hyperlipemia   ? Hypothyroidism   ? Insomnia   ? Malignant neoplasm of upper-outer quadrant of left female breast ([place])   ? Nephrolithiasis   ? Personal history of radiation therapy   ? Pneumonia 2015  ? aspirated after colonosopy  ? Renal cyst   ? ?Past Surgical History:  ?Procedure Laterality Date  ? BREAST BIOPSY Left 05/31/2019  ? mri  ? BREAST BIOPSY Left 04/2019  ? malignant  ? BREAST LUMPECTOMY Left 04/17/2018  ? malignant  ? BREAST LUMPECTOMY WITH RADIOACTIVE SEED AND SENTINEL LYMPH NODE BIOPSY Left 04/17/2018  ? Procedure: LEFT BREAST LUMPECTOMY WITH BRACKETED RADIOACTIVE SEEDS AND SENTINEL LYMPH NODE  BIOPSY;  Surgeon: Rolm Bookbinder, MD;  Location: [place];  Service: General;  Laterality: Left;  ? BREAST SURGERY Left   ? Lumpectomy  ? CARDIOVASCULAR STRESS TEST  07/19/2006  ? EF 70%, NO EVIDENCE OF ISCHEMIA  ? CHOLECYSTECTOMY  12/30/10  ? COLONOSCOPY    ? DILATION AND CURETTAGE OF UTERUS    ? HYSTEROSCOPY WITH D & C N/A 06/07/2018  ? Procedure: DILATATION AND CURETTAGE /HYSTEROSCOPY;  Surgeon: [name], MD;  Location: [facility];  Service: Gynecology;  Laterality: N/A;  ? inguinal herniography    ? NASAL SINUS SURGERY    ? RADIOACTIVE SEED GUIDED EXCISIONAL BREAST BIOPSY Right 04/17/2018  ? Procedure: RIGHT BREAST SEED GUIDED EXCISIONAL BIOPSY;  Surgeon: Rolm Bookbinder, MD;  Location: [place];  Service: General;  Laterality: Right;  ? RE-EXCISION OF BREAST LUMPECTOMY Left 05/15/2018  ? Procedure: RE-EXCISION OF LEFT BREAST LUMPECTOMY, ASPIRATION OF LEFT AXILLARY SEROMA;  Surgeon: Rolm Bookbinder, MD;  Location: [place];  Service: General;  Laterality: Left;  ? T & A    ? TOOTH EXTRACTION    ? TOTAL KNEE ARTHROPLASTY Left 09/11/2017  ? Procedure: LEFT TOTAL KNEE ARTHROPLASTY;  Surgeon: Gaynelle Arabian, MD;  Location: [facility];  Service: Orthopedics;  Laterality: Left;  with block  ? US ECHOCARDIOGRAPHY  07/17/2006  ? EF 55-60%  ? ?Patient Active Problem List  ? Diagnosis Date Noted  ? Spinal stenosis of lumbar region with radiculopathy 10/12/2021  ? Chronic postoperative pain 10/12/2021  ? Breast mass, right 03/27/2020  ? Hepatic steatosis 01/22/2020  ? Genetic testing 04/02/2018  ? Family history of breast cancer   ? Malignant neoplasm of upper-outer quadrant of left breast in female, estrogen receptor positive ([place]) 03/16/2018  ? OA (osteoarthritis) of knee 09/11/2017  ? Infrapatellar bursitis of right knee 04/29/2014  ? Pneumonia, aspiration ([place]) 12/28/2012  ? HYPOTHYROIDISM 11/15/2010  ? HYPERLIPIDEMIA 11/15/2010  ? DEPRESSION 11/15/2010  ? GERD  11/15/2010  ? CONSTIPATION 11/15/2010  ? GALLSTONES 11/15/2010  ? RENAL CYST 11/15/2010  ? INSOMNIA UNSPECIFIED 11/15/2010  ? COUGH 11/15/2010  ? ABDOMINAL PAIN OTHER SPECIFIED SITE 11/15/2010  ? TRANSAMINASES, SERUM, ELEVATED 11/15/2010  ? ? ?REFERRING DIAG: s/p Bilateral Lumpectomies with Left upper quarter Pain/swelling ? ?THERAPY DIAG:  ?Malignant neoplasm of upper-outer quadrant of left breast in female, estrogen receptor positive ([place]) ? ?Abnormal posture ? ?Localized edema ? ?Stiffness of left shoulder, not elsewhere classified ? ?PERTINENT HISTORY: Left Breast:  Grade 1-2 IDC, ER 100% and PR 100%, Ki 67 < 1%, Her 2 Neg Ratio 1.61; Additional Bx: Grade 2 ILC ER/PR Pos Her 2 Neg, Ki 1-5% ?04/17/2018: Bil lumpectomies: RT: ALH, Left: Grade 2 ILC, margins cleared with additional surgery ?Oncotype 14 (ROR 4%) ?07/09/2018 - 08/03/2018 : XRT ?09/11/2018- June 2020: Tamoxifen (cognitive issues) ?Genetics Neg ?09/26/2019- 11/26/2019: Letrozole )cognitive issues) ?12/24/2020- 02/2021: Exemestane discontinued due to AE ?Presently on Anastrazole  ? ?PRECAUTIONS: Other: sciatica, lymphedema risk, left TKA ? ?SUBJECTIVE: I bought 2 compression bras. By dinner time I am about to scream.  I don't see any change with the swelling, but I am still feeling a lot of pain at the lateral breast area. ? ?PAIN:  ?Are you having pain? Yes: NPRS scale: 7/10 ?Pain location: left lateral breast ?Pain description: tightness, tenderness, sharp pain ?Aggravating factors: lifting arm, ?Relieving factors: not sure ? ? ?OBJECTIVE: (objective measures completed at initial evaluation unless otherwise dated) ? ?COGNITION: ?           Overall cognitive status: Within functional limits for tasks assessed  ?  ?PALPATION: Tender left axillary region near incision ?  ?OBSERVATIONS / OTHER ASSESSMENTS: mild visible left breast swelling with fibrosis noted proximal to nipple. Healed incisions with mild thickening ?  ?SENSATION: ?           Light touch:  Deficits mild from prior surgery ?            ?  ?POSTURE: forward head, rounded shoulders, increased thoracic kyphosis ?  ?UPPER EXTREMITY AROM/PROM: ?  ?A/PROM RIGHT  04/05/2022 ?   ?Shoulder extension 70  ?Shoulder flexion 135  ?Shoulder abduction 150 ( slight scaption)  ?Shoulder internal rotation 78  ?Shoulder external rotation 92  ?                        (Blank rows = not tested) ?  ?A/PROM LEFT  04/05/2022  ?Shoulder extension 60  ?Shoulder flexion 132  ?Shoulder abduction 142(slight scaption)  ?Shoulder internal rotation 65  ?Shoulder external rotation 88  ?                        (Blank  rows = not tested) ?  ?  ?CERVICAL AROM: ?All within functional limits:  ?  ?  ?  ?  ?UPPER EXTREMITY STRENGTH: WFL ?  ?  ?  ?LYMPHEDEMA ASSESSMENTS:  ?  ?SURGERY TYPE/DATE: 04/17/2018 Bilateral lumpectomies ?  ?NUMBER OF LYMPH NODES REMOVED: 3-4 on left only ?  ?CHEMOTHERAPY: no ?  ?RADIATION:yes ?  ?HORMONE TREATMENT: yes,  ?  ?INFECTIONS: no ?  ?LYMPHEDEMA ASSESSMENTS:  ?  ?[place] RIGHT  04/05/2022  ?10 cm proximal to olecranon process 29.7  ?Olecranon process 25.5  ?10 cm proximal to ulnar styloid process 20.1  ?Just proximal to ulnar styloid process 15  ?Across hand at thumb web space 18.0  ?At base of 2nd digit 6.3  ?(Blank rows = not tested) ?  ?[place] LEFT  04/05/2022  ?10 cm proximal to olecranon process 31.2  ?Olecranon process 26.0  ?10 cm proximal to ulnar styloid process 19.8  ?Just proximal to ulnar styloid process 15.3  ?Across hand at thumb web space 18.1  ?At base of 2nd digit 6.0  ?(Blank rows = not tested) ?  ?  ?  ?  ?  ?QUICK DASH SURVEY: 11% ?  ?  ?TODAY'S TREATMENT  ?04/13/2022 ?MANUAL: ?Soft tissue mobilization in supine to left pectorals, lats and UT, and in Right SL to UT and scapular area with cocoa butter. ?PROM left shoulder flexion, scaption, abduction, ER with VC's to relax. ?Scar massage to incision at lateral breast in all directions ? 2 half inch foam pads in TG soft made for under bra band and  1 chip pack made for fibrotic area of left breast to place in bra. ? THERAPEUTIC EXERCISE: ? Pt. Educated in 4 post op exercises with flexion and stargazer done in supine, and scapular retraction and wall slide in

## 2022-04-19 ENCOUNTER — Telehealth: Payer: Self-pay | Admitting: Hematology and Oncology

## 2022-04-19 ENCOUNTER — Ambulatory Visit: Payer: Medicare Other

## 2022-04-19 DIAGNOSIS — C50412 Malignant neoplasm of upper-outer quadrant of left female breast: Secondary | ICD-10-CM

## 2022-04-19 DIAGNOSIS — Z170 Estrogen receptor positive status [ER+]: Secondary | ICD-10-CM | POA: Diagnosis not present

## 2022-04-19 DIAGNOSIS — R293 Abnormal posture: Secondary | ICD-10-CM | POA: Diagnosis not present

## 2022-04-19 DIAGNOSIS — M25612 Stiffness of left shoulder, not elsewhere classified: Secondary | ICD-10-CM

## 2022-04-19 DIAGNOSIS — R600 Localized edema: Secondary | ICD-10-CM

## 2022-04-19 NOTE — Therapy (Signed)
?OUTPATIENT PHYSICAL THERAPY TREATMENT NOTE ? ? ?Patient Name: [name] ?MRN: [id number] ?DOB:07/07/1946, 76 y.o., female ?Today's Date: 04/19/2022 ? ?PCP: Shon Baton MD ?REFERRING PROVIDER: Mauricio Po MD ? ?END OF SESSION:  ? PT End of Session - 04/19/22 1502   ? ? Visit Number 3   ? Number of Visits 12   ? Date for PT Re-Evaluation 05/17/22   ? PT Start Time 1503   ? PT Stop Time 6759   ? PT Time Calculation (min) 52 min   ? Activity Tolerance Patient tolerated treatment well   ? Behavior During Therapy [facility] for tasks assessed/performed   ? ?  ?  ? ?  ? ? ?Past Medical History:  ?Diagnosis Date  ? Arthritis   ? hands and knees  ? Complication of anesthesia   ? aspiration pna; following a colonoscopy, pt requests head of bed elevated if possible.  ? Constipation   ? Cough   ? Depression   ? Dysrhythmia   ? RBBB on 06-06-17 ekg   ? Elevated liver function tests   ? Esophageal stricture   ? Family history of breast cancer   ? Gallstones   ? Genetic testing 04/02/2018  ? STAT Breast panel with reflex to Multi-Cancer panel (83 genes) @ [facility] - No pathogenic mutations detected  ? GERD (gastroesophageal reflux disease)   ? Hiatal hernia   ? History of kidney stones   ? History of radiation therapy 07/09/2018- 08/03/18  ? Left Breast/ 40.05 Gy in 15 fractions. Left Breast boost 10 Gy in 5 fractions.   ? Hyperlipemia   ? Hypothyroidism   ? Insomnia   ? Malignant neoplasm of upper-outer quadrant of left female breast ([place])   ? Nephrolithiasis   ? Personal history of radiation therapy   ? Pneumonia 2015  ? aspirated after colonosopy  ? Renal cyst   ? ?Past Surgical History:  ?Procedure Laterality Date  ? BREAST BIOPSY Left 05/31/2019  ? mri  ? BREAST BIOPSY Left 04/2019  ? malignant  ? BREAST LUMPECTOMY Left 04/17/2018  ? malignant  ? BREAST LUMPECTOMY WITH RADIOACTIVE SEED AND SENTINEL LYMPH NODE BIOPSY Left 04/17/2018  ? Procedure: LEFT BREAST LUMPECTOMY WITH BRACKETED RADIOACTIVE SEEDS AND SENTINEL LYMPH NODE  BIOPSY;  Surgeon: Rolm Bookbinder, MD;  Location: [place];  Service: General;  Laterality: Left;  ? BREAST SURGERY Left   ? Lumpectomy  ? CARDIOVASCULAR STRESS TEST  07/19/2006  ? EF 70%, NO EVIDENCE OF ISCHEMIA  ? CHOLECYSTECTOMY  12/30/10  ? COLONOSCOPY    ? DILATION AND CURETTAGE OF UTERUS    ? HYSTEROSCOPY WITH D & C N/A 06/07/2018  ? Procedure: DILATATION AND CURETTAGE /HYSTEROSCOPY;  Surgeon: [name], MD;  Location: [facility];  Service: Gynecology;  Laterality: N/A;  ? inguinal herniography    ? NASAL SINUS SURGERY    ? RADIOACTIVE SEED GUIDED EXCISIONAL BREAST BIOPSY Right 04/17/2018  ? Procedure: RIGHT BREAST SEED GUIDED EXCISIONAL BIOPSY;  Surgeon: Rolm Bookbinder, MD;  Location: [place];  Service: General;  Laterality: Right;  ? RE-EXCISION OF BREAST LUMPECTOMY Left 05/15/2018  ? Procedure: RE-EXCISION OF LEFT BREAST LUMPECTOMY, ASPIRATION OF LEFT AXILLARY SEROMA;  Surgeon: Rolm Bookbinder, MD;  Location: [place];  Service: General;  Laterality: Left;  ? T & A    ? TOOTH EXTRACTION    ? TOTAL KNEE ARTHROPLASTY Left 09/11/2017  ? Procedure: LEFT TOTAL KNEE ARTHROPLASTY;  Surgeon: Gaynelle Arabian, MD;  Location: [facility];  Service: Orthopedics;  Laterality: Left;  with block  ? US ECHOCARDIOGRAPHY  07/17/2006  ? EF 55-60%  ? ?Patient Active Problem List  ? Diagnosis Date Noted  ? Spinal stenosis of lumbar region with radiculopathy 10/12/2021  ? Chronic postoperative pain 10/12/2021  ? Breast mass, right 03/27/2020  ? Hepatic steatosis 01/22/2020  ? Genetic testing 04/02/2018  ? Family history of breast cancer   ? Malignant neoplasm of upper-outer quadrant of left breast in female, estrogen receptor positive ([place]) 03/16/2018  ? OA (osteoarthritis) of knee 09/11/2017  ? Infrapatellar bursitis of right knee 04/29/2014  ? Pneumonia, aspiration ([place]) 12/28/2012  ? HYPOTHYROIDISM 11/15/2010  ? HYPERLIPIDEMIA 11/15/2010  ? DEPRESSION 11/15/2010  ? GERD  11/15/2010  ? CONSTIPATION 11/15/2010  ? GALLSTONES 11/15/2010  ? RENAL CYST 11/15/2010  ? INSOMNIA UNSPECIFIED 11/15/2010  ? COUGH 11/15/2010  ? ABDOMINAL PAIN OTHER SPECIFIED SITE 11/15/2010  ? TRANSAMINASES, SERUM, ELEVATED 11/15/2010  ? ? ?REFERRING DIAG: s/p Bilateral Lumpectomies with Left upper quarter Pain/swelling ? ?THERAPY DIAG:  ?Malignant neoplasm of upper-outer quadrant of left breast in female, estrogen receptor positive ([place]) ? ?Abnormal posture ? ?Localized edema ? ?Stiffness of left shoulder, not elsewhere classified ? ?PERTINENT HISTORY: Left Breast:  Grade 1-2 IDC, ER 100% and PR 100%, Ki 67 < 1%, Her 2 Neg Ratio 1.61; Additional Bx: Grade 2 ILC ER/PR Pos Her 2 Neg, Ki 1-5% ?04/17/2018: Bil lumpectomies: RT: ALH, Left: Grade 2 ILC, margins cleared with additional surgery ?Oncotype 14 (ROR 4%) ?07/09/2018 - 08/03/2018 : XRT ?09/11/2018- June 2020: Tamoxifen (cognitive issues) ?Genetics Neg ?09/26/2019- 11/26/2019: Letrozole )cognitive issues) ?12/24/2020- 02/2021: Exemestane discontinued due to AE ?Presently on Anastrazole  ? ?PRECAUTIONS: Other: sciatica, lymphedema risk, left TKA ? ?SUBJECTIVE: The foam packs help for comfort but it is hot. The tan bra is a little more comfortable. Tightness is the same. No better after last visit, but I have been like this a long time. Pt reports compliance with HEP ?PAIN:  ?Are you having pain? Yes: NPRS scale: 6/10 ?Pain location: left lateral breast ?Pain description: tightness, tenderness, sharp pain ?Aggravating factors: lifting arm, ?Relieving factors: not sure ? ? ?OBJECTIVE: (objective measures completed at initial evaluation unless otherwise dated) ? ?COGNITION: ?           Overall cognitive status: Within functional limits for tasks assessed  ?  ?PALPATION: Tender left axillary region near incision ?  ?OBSERVATIONS / OTHER ASSESSMENTS: mild visible left breast swelling with fibrosis noted proximal to nipple. Healed incisions with mild thickening ?   ?SENSATION: ?           Light touch: Deficits mild from prior surgery ?            ?  ?POSTURE: forward head, rounded shoulders, increased thoracic kyphosis ?  ?UPPER EXTREMITY AROM/PROM: ?  ?A/PROM RIGHT  04/05/2022 ?   ?Shoulder extension 70  ?Shoulder flexion 135  ?Shoulder abduction 150 ( slight scaption)  ?Shoulder internal rotation 78  ?Shoulder external rotation 92  ?                        (Blank rows = not tested) ?  ?A/PROM LEFT  04/05/2022  ?Shoulder extension 60  ?Shoulder flexion 132  ?Shoulder abduction 142(slight scaption)  ?Shoulder internal rotation 65  ?Shoulder external rotation 88  ?                        (  Blank rows = not tested) ?  ?  ?CERVICAL AROM: ?All within functional limits:  ?  ?  ?  ?  ?UPPER EXTREMITY STRENGTH: WFL ?  ?  ?  ?LYMPHEDEMA ASSESSMENTS:  ?  ?SURGERY TYPE/DATE: 04/17/2018 Bilateral lumpectomies ?  ?NUMBER OF LYMPH NODES REMOVED: 3-4 on left only ?  ?CHEMOTHERAPY: no ?  ?RADIATION:yes ?  ?HORMONE TREATMENT: yes,  ?  ?INFECTIONS: no ?  ?LYMPHEDEMA ASSESSMENTS:  ?  ?[place] RIGHT  04/05/2022  ?10 cm proximal to olecranon process 29.7  ?Olecranon process 25.5  ?10 cm proximal to ulnar styloid process 20.1  ?Just proximal to ulnar styloid process 15  ?Across hand at thumb web space 18.0  ?At base of 2nd digit 6.3  ?(Blank rows = not tested) ?  ?[place] LEFT  04/05/2022  ?10 cm proximal to olecranon process 31.2  ?Olecranon process 26.0  ?10 cm proximal to ulnar styloid process 19.8  ?Just proximal to ulnar styloid process 15.3  ?Across hand at thumb web space 18.1  ?At base of 2nd digit 6.0  ?(Blank rows = not tested) ?  ?  ?  ?  ?  ?QUICK DASH SURVEY: 11% ?  ?  ?TODAY'S TREATMENT  ? ?04/19/2022 ? ?Soft tissue mobilization in supine to left pectorals, lats and UT, and in Right SL to UT and scapular area with cocoa butter. ?PROM left shoulder flexion, scaption, abduction, ER with VC's to relax ?MLD: supine with head of table elevated; short neck, bilateral axillary LN's, left inguinal  LN's and Left breast retracing steps with emphasis on area of firmness and most to lateral breast and retracing steps and ending with LN's. Educated pt while performing. ?Therapeutic Exercise; supine wand scaption x 5

## 2022-04-19 NOTE — Telephone Encounter (Signed)
Informed patient that [facility] was predicting that she would benefit from extended adjuvant endocrine therapy ?I recommended that she continue the antiestrogen therapy for 2 more years. ?She currently takes antiestrogen therapy anastrozole 3 times a week. ?

## 2022-04-19 NOTE — Patient Instructions (Signed)
Access Code: [email or web address] ?URL: [email or web address] ?Date: 04/19/2022 ?Prepared by: Cheral Almas ? ?Exercises ?- Supine Lower Trunk Rotation  - 2 x daily - 7 x weekly - 1 sets - 5 reps ?- Supine Shoulder Flexion with [place]  - 2 x daily - 7 x weekly - 1 sets - 5 reps - 5 hold ?

## 2022-04-21 ENCOUNTER — Ambulatory Visit: Payer: Medicare Other

## 2022-04-21 DIAGNOSIS — R600 Localized edema: Secondary | ICD-10-CM

## 2022-04-21 DIAGNOSIS — Z170 Estrogen receptor positive status [ER+]: Secondary | ICD-10-CM

## 2022-04-21 DIAGNOSIS — M25612 Stiffness of left shoulder, not elsewhere classified: Secondary | ICD-10-CM | POA: Diagnosis not present

## 2022-04-21 DIAGNOSIS — R293 Abnormal posture: Secondary | ICD-10-CM

## 2022-04-21 DIAGNOSIS — C50412 Malignant neoplasm of upper-outer quadrant of left female breast: Secondary | ICD-10-CM | POA: Diagnosis not present

## 2022-04-21 NOTE — Therapy (Signed)
?OUTPATIENT PHYSICAL THERAPY TREATMENT NOTE ? ? ?Patient Name: [name] ?MRN: [id number] ?DOB:[birth year], 76 y.o., female ?Today's Date: 04/21/2022 ? ?PCP: Shon Baton MD ?REFERRING PROVIDER: Mauricio Po MD ? ?END OF SESSION:  ? PT End of Session - 04/21/22 1353   ? ? Visit Number 4   ? Number of Visits 12   ? Date for PT Re-Evaluation 05/17/22   ? PT Start Time 0865   ? PT Stop Time 7846   ? PT Time Calculation (min) 64 min   ? Activity Tolerance Patient tolerated treatment well   ? Behavior During Therapy [facility] for tasks assessed/performed   ? ?  ?  ? ?  ? ? ?Past Medical History:  ?Diagnosis Date  ? Arthritis   ? hands and knees  ? Complication of anesthesia   ? aspiration pna; following a colonoscopy, pt requests head of bed elevated if possible.  ? Constipation   ? Cough   ? Depression   ? Dysrhythmia   ? RBBB on 06-06-17 ekg   ? Elevated liver function tests   ? Esophageal stricture   ? Family history of breast cancer   ? Gallstones   ? Genetic testing 04/02/2018  ? STAT Breast panel with reflex to Multi-Cancer panel (83 genes) @ [facility] - No pathogenic mutations detected  ? GERD (gastroesophageal reflux disease)   ? Hiatal hernia   ? History of kidney stones   ? History of radiation therapy 07/09/2018- 08/03/18  ? Left Breast/ 40.05 Gy in 15 fractions. Left Breast boost 10 Gy in 5 fractions.   ? Hyperlipemia   ? Hypothyroidism   ? Insomnia   ? Malignant neoplasm of upper-outer quadrant of left female breast ([place])   ? Nephrolithiasis   ? Personal history of radiation therapy   ? Pneumonia 2015  ? aspirated after colonosopy  ? Renal cyst   ? ?Past Surgical History:  ?Procedure Laterality Date  ? BREAST BIOPSY Left 05/31/2019  ? mri  ? BREAST BIOPSY Left 04/2019  ? malignant  ? BREAST LUMPECTOMY Left 04/17/2018  ? malignant  ? BREAST LUMPECTOMY WITH RADIOACTIVE SEED AND SENTINEL LYMPH NODE BIOPSY Left 04/17/2018  ? Procedure: LEFT BREAST LUMPECTOMY WITH BRACKETED RADIOACTIVE SEEDS AND SENTINEL LYMPH NODE  BIOPSY;  Surgeon: Rolm Bookbinder, MD;  Location: [place];  Service: General;  Laterality: Left;  ? BREAST SURGERY Left   ? Lumpectomy  ? CARDIOVASCULAR STRESS TEST  07/19/2006  ? EF 70%, NO EVIDENCE OF ISCHEMIA  ? CHOLECYSTECTOMY  12/30/10  ? COLONOSCOPY    ? DILATION AND CURETTAGE OF UTERUS    ? HYSTEROSCOPY WITH D & C N/A 06/07/2018  ? Procedure: DILATATION AND CURETTAGE /HYSTEROSCOPY;  Surgeon: [name], MD;  Location: [place];  Service: Gynecology;  Laterality: N/A;  ? inguinal herniography    ? NASAL SINUS SURGERY    ? RADIOACTIVE SEED GUIDED EXCISIONAL BREAST BIOPSY Right 04/17/2018  ? Procedure: RIGHT BREAST SEED GUIDED EXCISIONAL BIOPSY;  Surgeon: Rolm Bookbinder, MD;  Location: [place];  Service: General;  Laterality: Right;  ? RE-EXCISION OF BREAST LUMPECTOMY Left 05/15/2018  ? Procedure: RE-EXCISION OF LEFT BREAST LUMPECTOMY, ASPIRATION OF LEFT AXILLARY SEROMA;  Surgeon: Rolm Bookbinder, MD;  Location: [place];  Service: General;  Laterality: Left;  ? T & A    ? TOOTH EXTRACTION    ? TOTAL KNEE ARTHROPLASTY Left 09/11/2017  ? Procedure: LEFT TOTAL KNEE ARTHROPLASTY;  Surgeon: Gaynelle Arabian, MD;  Location: [facility];  Service: Orthopedics;  Laterality: Left;  with block  ? US ECHOCARDIOGRAPHY  07/17/2006  ? EF 55-60%  ? ?Patient Active Problem List  ? Diagnosis Date Noted  ? Spinal stenosis of lumbar region with radiculopathy 10/12/2021  ? Chronic postoperative pain 10/12/2021  ? Breast mass, right 03/27/2020  ? Hepatic steatosis 01/22/2020  ? Genetic testing 04/02/2018  ? Family history of breast cancer   ? Malignant neoplasm of upper-outer quadrant of left breast in female, estrogen receptor positive ([place]) 03/16/2018  ? OA (osteoarthritis) of knee 09/11/2017  ? Infrapatellar bursitis of right knee 04/29/2014  ? Pneumonia, aspiration ([place]) 12/28/2012  ? HYPOTHYROIDISM 11/15/2010  ? HYPERLIPIDEMIA 11/15/2010  ? DEPRESSION 11/15/2010  ? GERD  11/15/2010  ? CONSTIPATION 11/15/2010  ? GALLSTONES 11/15/2010  ? RENAL CYST 11/15/2010  ? INSOMNIA UNSPECIFIED 11/15/2010  ? COUGH 11/15/2010  ? ABDOMINAL PAIN OTHER SPECIFIED SITE 11/15/2010  ? TRANSAMINASES, SERUM, ELEVATED 11/15/2010  ? ? ?REFERRING DIAG: s/p Bilateral Lumpectomies with Left upper quarter Pain/swelling ? ?THERAPY DIAG:  ?Malignant neoplasm of upper-outer quadrant of left breast in female, estrogen receptor positive ([place]) ? ?Abnormal posture ? ?Localized edema ? ?Stiffness of left shoulder, not elsewhere classified ? ?PERTINENT HISTORY: Left Breast:  Grade 1-2 IDC, ER 100% and PR 100%, Ki 67 < 1%, Her 2 Neg Ratio 1.61; Additional Bx: Grade 2 ILC ER/PR Pos Her 2 Neg, Ki 1-5% ?04/17/2018: Bil lumpectomies: RT: ALH, Left: Grade 2 ILC, margins cleared with additional surgery ?Oncotype 14 (ROR 4%) ?07/09/2018 - 08/03/2018 : XRT ?09/11/2018- June 2020: Tamoxifen (cognitive issues) ?Genetics Neg ?09/26/2019- 11/26/2019: Letrozole )cognitive issues) ?12/24/2020- 02/2021: Exemestane discontinued due to AE ?Presently on Anastrazole  ? ?PRECAUTIONS: Other: sciatica, lymphedema risk, left TKA ? ?SUBJECTIVE: I have the chip pack in my compression bra. I notice the soreness more with the compression bra on. ? ?Are you having pain? Yes: NPRS scale: 6/10 ?Pain location: left lateral breast ?Pain description: tightness, tenderness, sharp pain ?Aggravating factors: lifting arm, ?Relieving factors: not sure ? ? ?OBJECTIVE: (objective measures completed at initial evaluation unless otherwise dated) ? ?COGNITION: ?           Overall cognitive status: Within functional limits for tasks assessed  ?  ?PALPATION: Tender left axillary region near incision ?  ?OBSERVATIONS / OTHER ASSESSMENTS: mild visible left breast swelling with fibrosis noted proximal to nipple. Healed incisions with mild thickening ?  ?SENSATION: ?           Light touch: Deficits mild from prior surgery ?            ?  ?POSTURE: forward head, rounded  shoulders, increased thoracic kyphosis ?  ?UPPER EXTREMITY AROM/PROM: ?  ?A/PROM RIGHT  04/05/2022 ?   ?Shoulder extension 70  ?Shoulder flexion 135  ?Shoulder abduction 150 ( slight scaption)  ?Shoulder internal rotation 78  ?Shoulder external rotation 92  ?                        (Blank rows = not tested) ?  ?A/PROM LEFT  04/05/2022  ?Shoulder extension 60  ?Shoulder flexion 132  ?Shoulder abduction 142(slight scaption)  ?Shoulder internal rotation 65  ?Shoulder external rotation 88  ?                        (Blank rows = not tested) ?  ?  ?CERVICAL AROM: ?All within functional limits:  ?  ?  ?  ?  ?  UPPER EXTREMITY STRENGTH: WFL ?  ?  ?  ?LYMPHEDEMA ASSESSMENTS:  ?  ?SURGERY TYPE/DATE: 04/17/2018 Bilateral lumpectomies ?  ?NUMBER OF LYMPH NODES REMOVED: 3-4 on left only ?  ?CHEMOTHERAPY: no ?  ?RADIATION:yes ?  ?HORMONE TREATMENT: yes,  ?  ?INFECTIONS: no ?  ?LYMPHEDEMA ASSESSMENTS:  ?  ?[place] RIGHT  04/05/2022  ?10 cm proximal to olecranon process 29.7  ?Olecranon process 25.5  ?10 cm proximal to ulnar styloid process 20.1  ?Just proximal to ulnar styloid process 15  ?Across hand at thumb web space 18.0  ?At base of 2nd digit 6.3  ?(Blank rows = not tested) ?  ?[place] LEFT  04/05/2022  ?10 cm proximal to olecranon process 31.2  ?Olecranon process 26.0  ?10 cm proximal to ulnar styloid process 19.8  ?Just proximal to ulnar styloid process 15.3  ?Across hand at thumb web space 18.1  ?At base of 2nd digit 6.0  ?(Blank rows = not tested) ?  ?  ?  ?  ?  ?QUICK DASH SURVEY: 11% ?  ?  ?TODAY'S TREATMENT  ? ?04/21/2022 ?Soft tissue mobilization in supine to left pectorals, lats and UT, and in Right SL to UT and scapular area with cocoa butter. ?PROM left shoulder flexion, scaption, abduction, ER with VC's to relax ?MLD: supine with head of table elevated; short neck, 5 breaths, bilateral axillary LN's, left inguinal LN's and Left breast retracing steps with emphasis on area of firmness and most to lateral breast and  retracing steps and ending with LN's. Educated pt while performing and had pt perform left breast MLD working toward left axillo-inguinal pathway. ?Therapeutic Exercise; AA shoulder flex x 5,star gazer x 5, supine wand s

## 2022-04-25 DIAGNOSIS — M1990 Unspecified osteoarthritis, unspecified site: Secondary | ICD-10-CM | POA: Diagnosis not present

## 2022-04-25 DIAGNOSIS — K219 Gastro-esophageal reflux disease without esophagitis: Secondary | ICD-10-CM | POA: Diagnosis not present

## 2022-04-25 DIAGNOSIS — M8580 Other specified disorders of bone density and structure, unspecified site: Secondary | ICD-10-CM | POA: Diagnosis not present

## 2022-04-25 DIAGNOSIS — E663 Overweight: Secondary | ICD-10-CM | POA: Diagnosis not present

## 2022-04-25 DIAGNOSIS — K760 Fatty (change of) liver, not elsewhere classified: Secondary | ICD-10-CM | POA: Diagnosis not present

## 2022-04-25 DIAGNOSIS — E039 Hypothyroidism, unspecified: Secondary | ICD-10-CM | POA: Diagnosis not present

## 2022-04-25 DIAGNOSIS — E785 Hyperlipidemia, unspecified: Secondary | ICD-10-CM | POA: Diagnosis not present

## 2022-04-25 DIAGNOSIS — M5430 Sciatica, unspecified side: Secondary | ICD-10-CM | POA: Diagnosis not present

## 2022-04-25 DIAGNOSIS — R739 Hyperglycemia, unspecified: Secondary | ICD-10-CM | POA: Diagnosis not present

## 2022-04-25 DIAGNOSIS — G4700 Insomnia, unspecified: Secondary | ICD-10-CM | POA: Diagnosis not present

## 2022-04-25 DIAGNOSIS — C50412 Malignant neoplasm of upper-outer quadrant of left female breast: Secondary | ICD-10-CM | POA: Diagnosis not present

## 2022-04-25 DIAGNOSIS — F329 Major depressive disorder, single episode, unspecified: Secondary | ICD-10-CM | POA: Diagnosis not present

## 2022-04-26 ENCOUNTER — Ambulatory Visit: Payer: Medicare Other

## 2022-04-26 DIAGNOSIS — R293 Abnormal posture: Secondary | ICD-10-CM

## 2022-04-26 DIAGNOSIS — M25612 Stiffness of left shoulder, not elsewhere classified: Secondary | ICD-10-CM

## 2022-04-26 DIAGNOSIS — R600 Localized edema: Secondary | ICD-10-CM | POA: Diagnosis not present

## 2022-04-26 DIAGNOSIS — Z170 Estrogen receptor positive status [ER+]: Secondary | ICD-10-CM | POA: Diagnosis not present

## 2022-04-26 DIAGNOSIS — C50412 Malignant neoplasm of upper-outer quadrant of left female breast: Secondary | ICD-10-CM | POA: Diagnosis not present

## 2022-04-26 NOTE — Therapy (Signed)
?OUTPATIENT PHYSICAL THERAPY TREATMENT NOTE ? ? ?Patient Name: [name] ?MRN: [id number] ?DOB:1946/07/20, 76 y.o., female ?Today's Date: 04/26/2022 ? ?PCP: Shon Baton MD ?REFERRING PROVIDER: Mauricio Po MD ? ?END OF SESSION:  ? PT End of Session - 04/26/22 1457   ? ? Visit Number 5   ? Number of Visits 12   ? Date for PT Re-Evaluation 05/17/22   ? PT Start Time 1500   ? PT Stop Time 1550   ? PT Time Calculation (min) 50 min   ? Activity Tolerance Patient tolerated treatment well   ? Behavior During Therapy [facility] for tasks assessed/performed   ? ?  ?  ? ?  ? ? ?Past Medical History:  ?Diagnosis Date  ? Arthritis   ? hands and knees  ? Complication of anesthesia   ? aspiration pna; following a colonoscopy, pt requests head of bed elevated if possible.  ? Constipation   ? Cough   ? Depression   ? Dysrhythmia   ? RBBB on 06-06-17 ekg   ? Elevated liver function tests   ? Esophageal stricture   ? Family history of breast cancer   ? Gallstones   ? Genetic testing 04/02/2018  ? STAT Breast panel with reflex to Multi-Cancer panel (83 genes) @ [facility] - No pathogenic mutations detected  ? GERD (gastroesophageal reflux disease)   ? Hiatal hernia   ? History of kidney stones   ? History of radiation therapy 07/09/2018- 08/03/18  ? Left Breast/ 40.05 Gy in 15 fractions. Left Breast boost 10 Gy in 5 fractions.   ? Hyperlipemia   ? Hypothyroidism   ? Insomnia   ? Malignant neoplasm of upper-outer quadrant of left female breast ([place])   ? Nephrolithiasis   ? Personal history of radiation therapy   ? Pneumonia 2015  ? aspirated after colonosopy  ? Renal cyst   ? ?Past Surgical History:  ?Procedure Laterality Date  ? BREAST BIOPSY Left 05/31/2019  ? mri  ? BREAST BIOPSY Left 04/2019  ? malignant  ? BREAST LUMPECTOMY Left 04/17/2018  ? malignant  ? BREAST LUMPECTOMY WITH RADIOACTIVE SEED AND SENTINEL LYMPH NODE BIOPSY Left 04/17/2018  ? Procedure: LEFT BREAST LUMPECTOMY WITH BRACKETED RADIOACTIVE SEEDS AND SENTINEL LYMPH NODE  BIOPSY;  Surgeon: Rolm Bookbinder, MD;  Location: [place];  Service: General;  Laterality: Left;  ? BREAST SURGERY Left   ? Lumpectomy  ? CARDIOVASCULAR STRESS TEST  07/19/2006  ? EF 70%, NO EVIDENCE OF ISCHEMIA  ? CHOLECYSTECTOMY  12/30/10  ? COLONOSCOPY    ? DILATION AND CURETTAGE OF UTERUS    ? HYSTEROSCOPY WITH D & C N/A 06/07/2018  ? Procedure: DILATATION AND CURETTAGE /HYSTEROSCOPY;  Surgeon: [name], MD;  Location: [place] [facility];  Service: Gynecology;  Laterality: N/A;  ? inguinal herniography    ? NASAL SINUS SURGERY    ? RADIOACTIVE SEED GUIDED EXCISIONAL BREAST BIOPSY Right 04/17/2018  ? Procedure: RIGHT BREAST SEED GUIDED EXCISIONAL BIOPSY;  Surgeon: Rolm Bookbinder, MD;  Location: [place];  Service: General;  Laterality: Right;  ? RE-EXCISION OF BREAST LUMPECTOMY Left 05/15/2018  ? Procedure: RE-EXCISION OF LEFT BREAST LUMPECTOMY, ASPIRATION OF LEFT AXILLARY SEROMA;  Surgeon: Rolm Bookbinder, MD;  Location: [place];  Service: General;  Laterality: Left;  ? T & A    ? TOOTH EXTRACTION    ? TOTAL KNEE ARTHROPLASTY Left 09/11/2017  ? Procedure: LEFT TOTAL KNEE ARTHROPLASTY;  Surgeon: Gaynelle Arabian, MD;  Location: [facility];  Service: Orthopedics;  Laterality: Left;  with block  ? US ECHOCARDIOGRAPHY  07/17/2006  ? EF 55-60%  ? ?Patient Active Problem List  ? Diagnosis Date Noted  ? Spinal stenosis of lumbar region with radiculopathy 10/12/2021  ? Chronic postoperative pain 10/12/2021  ? Breast mass, right 03/27/2020  ? Hepatic steatosis 01/22/2020  ? Genetic testing 04/02/2018  ? Family history of breast cancer   ? Malignant neoplasm of upper-outer quadrant of left breast in female, estrogen receptor positive ([place]) 03/16/2018  ? OA (osteoarthritis) of knee 09/11/2017  ? Infrapatellar bursitis of right knee 04/29/2014  ? Pneumonia, aspiration ([place]) 12/28/2012  ? HYPOTHYROIDISM 11/15/2010  ? HYPERLIPIDEMIA 11/15/2010  ? DEPRESSION 11/15/2010  ? GERD  11/15/2010  ? CONSTIPATION 11/15/2010  ? GALLSTONES 11/15/2010  ? RENAL CYST 11/15/2010  ? INSOMNIA UNSPECIFIED 11/15/2010  ? COUGH 11/15/2010  ? ABDOMINAL PAIN OTHER SPECIFIED SITE 11/15/2010  ? TRANSAMINASES, SERUM, ELEVATED 11/15/2010  ? ? ?REFERRING DIAG: s/p Bilateral Lumpectomies with Left upper quarter Pain/swelling ? ?THERAPY DIAG:  ?Malignant neoplasm of upper-outer quadrant of left breast in female, estrogen receptor positive ([place]) ? ?Abnormal posture ? ?Localized edema ? ?Stiffness of left shoulder, not elsewhere classified ? ?PERTINENT HISTORY: Left Breast:  Grade 1-2 IDC, ER 100% and PR 100%, Ki 67 < 1%, Her 2 Neg Ratio 1.61; Additional Bx: Grade 2 ILC ER/PR Pos Her 2 Neg, Ki 1-5% ?04/17/2018: Bil lumpectomies: RT: ALH, Left: Grade 2 ILC, margins cleared with additional surgery ?Oncotype 14 (ROR 4%) ?07/09/2018 - 08/03/2018 : XRT ?09/11/2018- June 2020: Tamoxifen (cognitive issues) ?Genetics Neg ?09/26/2019- 11/26/2019: Letrozole )cognitive issues) ?12/24/2020- 02/2021: Exemestane discontinued due to AE ?Presently on Anastrazole  ? ?PRECAUTIONS: Other: sciatica, lymphedema risk, left TKA ? ?SUBJECTIVE: I think I over did it today. I worked in my garden all morning pulling weeks sitting on my little bench. My mid to lower back is bothering me. I didn't notice any difference after the MLD last time. I tried the MLD but I don't feel any difference. ?Are you having pain? Yes: NPRS scale: 7/10 ?Pain location: left lateral breast ?Pain description: tightness, tenderness, sharp pain ?Aggravating factors: if dog or grandchild jumps on her,lifting., ?Relieving factors: not sure ? ? ?OBJECTIVE: (objective measures completed at initial evaluation unless otherwise dated) ? ?COGNITION: ?           Overall cognitive status: Within functional limits for tasks assessed  ?  ?PALPATION: Tender left axillary region near incision ?  ?OBSERVATIONS / OTHER ASSESSMENTS: mild visible left breast swelling with fibrosis noted  proximal to nipple. Healed incisions with mild thickening ?  ?SENSATION: ?           Light touch: Deficits mild from prior surgery ?            ?  ?POSTURE: forward head, rounded shoulders, increased thoracic kyphosis ?  ?UPPER EXTREMITY AROM/PROM: ?  ?A/PROM RIGHT  04/05/2022 ?   ?Shoulder extension 70  ?Shoulder flexion 135  ?Shoulder abduction 150 ( slight scaption)  ?Shoulder internal rotation 78  ?Shoulder external rotation 92  ?                        (Blank rows = not tested) ?  ?A/PROM LEFT  04/05/2022  ?Shoulder extension 60  ?Shoulder flexion 132  ?Shoulder abduction 142(slight scaption)  ?Shoulder internal rotation 65  ?Shoulder external rotation 88  ?                        (  Blank rows = not tested) ?  ?  ?CERVICAL AROM: ?All within functional limits:  ?  ?  ?  ?  ?UPPER EXTREMITY STRENGTH: WFL ?  ?  ?  ?LYMPHEDEMA ASSESSMENTS:  ?  ?SURGERY TYPE/DATE: 04/17/2018 Bilateral lumpectomies ?  ?NUMBER OF LYMPH NODES REMOVED: 3-4 on left only ?  ?CHEMOTHERAPY: no ?  ?RADIATION:yes ?  ?HORMONE TREATMENT: yes,  ?  ?INFECTIONS: no ?  ?LYMPHEDEMA ASSESSMENTS:  ?  ?[place] RIGHT  04/05/2022  ?10 cm proximal to olecranon process 29.7  ?Olecranon process 25.5  ?10 cm proximal to ulnar styloid process 20.1  ?Just proximal to ulnar styloid process 15  ?Across hand at thumb web space 18.0  ?At base of 2nd digit 6.3  ?(Blank rows = not tested) ?  ?[place] LEFT  04/05/2022  ?10 cm proximal to olecranon process 31.2  ?Olecranon process 26.0  ?10 cm proximal to ulnar styloid process 19.8  ?Just proximal to ulnar styloid process 15.3  ?Across hand at thumb web space 18.1  ?At base of 2nd digit 6.0  ?(Blank rows = not tested) ?  ?  ?  ?  ?  ?QUICK DASH SURVEY: 11% ?  ?  ?TODAY'S TREATMENT  ? ?04/26/2022 ?Soft tissue mobilization in supine to left pectorals, lats and UT, and in Right SL to UT and scapular area with cocoa butter. ?PROM left shoulder flexion, scaption, abduction, ER with VC's to relax ?MLD: supine with head of table  elevated; short neck, 5 breaths, bilateral axillary LN's, left inguinal LN's and Left breast retracing steps with emphasis on area of firmness and most to lateral breast and retracing steps and ending with LN's. Jasper Loser

## 2022-04-28 ENCOUNTER — Ambulatory Visit: Payer: Medicare Other

## 2022-04-28 DIAGNOSIS — R293 Abnormal posture: Secondary | ICD-10-CM | POA: Diagnosis not present

## 2022-04-28 DIAGNOSIS — C50412 Malignant neoplasm of upper-outer quadrant of left female breast: Secondary | ICD-10-CM | POA: Diagnosis not present

## 2022-04-28 DIAGNOSIS — M25612 Stiffness of left shoulder, not elsewhere classified: Secondary | ICD-10-CM | POA: Diagnosis not present

## 2022-04-28 DIAGNOSIS — R600 Localized edema: Secondary | ICD-10-CM | POA: Diagnosis not present

## 2022-04-28 DIAGNOSIS — Z170 Estrogen receptor positive status [ER+]: Secondary | ICD-10-CM | POA: Diagnosis not present

## 2022-04-28 NOTE — Therapy (Signed)
OUTPATIENT PHYSICAL THERAPY TREATMENT NOTE   Patient Name: [name] MRN: [id number] DOB:12/26/1945, 76 y.o., female Today's Date: 04/28/2022  PCP: Creola Corn MD REFERRING PROVIDER: Silva Bandy MD  END OF SESSION:   PT End of Session - 04/28/22 1558     Visit Number 6    Number of Visits 12    Date for PT Re-Evaluation 05/17/22    PT Start Time 1600    PT Stop Time 1657    PT Time Calculation (min) 57 min    Activity Tolerance Patient tolerated treatment well    Behavior During Therapy WFL for tasks assessed/performed             Past Medical History:  Diagnosis Date   Arthritis    hands and knees   Complication of anesthesia    aspiration pna; following a colonoscopy, pt requests head of bed elevated if possible.   Constipation    Cough    Depression    Dysrhythmia    RBBB on 06-06-17 ekg    Elevated liver function tests    Esophageal stricture    Family history of breast cancer    Gallstones    Genetic testing 04/02/2018   STAT Breast panel with reflex to Multi-Cancer panel (83 genes) @ [facility] - No pathogenic mutations detected   GERD (gastroesophageal reflux disease)    Hiatal hernia    History of kidney stones    History of radiation therapy 07/09/2018- 08/03/18   Left Breast/ 40.05 Gy in 15 fractions. Left Breast boost 10 Gy in 5 fractions.    Hyperlipemia    Hypothyroidism    Insomnia    Malignant neoplasm of upper-outer quadrant of left female breast (HCC)    Nephrolithiasis    Personal history of radiation therapy    Pneumonia 2015   aspirated after colonosopy   Renal cyst    Past Surgical History:  Procedure Laterality Date   BREAST BIOPSY Left 05/31/2019   mri   BREAST BIOPSY Left 04/2019   malignant   BREAST LUMPECTOMY Left 04/17/2018   malignant   BREAST LUMPECTOMY WITH RADIOACTIVE SEED AND SENTINEL LYMPH NODE BIOPSY Left 04/17/2018   Procedure: LEFT BREAST LUMPECTOMY WITH BRACKETED RADIOACTIVE SEEDS AND SENTINEL LYMPH NODE  BIOPSY;  Surgeon: Emelia Loron, MD;  Location: [facility];  Service: General;  Laterality: Left;   BREAST SURGERY Left    Lumpectomy   CARDIOVASCULAR STRESS TEST  07/19/2006   EF 70%, NO EVIDENCE OF ISCHEMIA   CHOLECYSTECTOMY  12/30/10   COLONOSCOPY     DILATION AND CURETTAGE OF UTERUS     HYSTEROSCOPY WITH D & C N/A 06/07/2018   Procedure: DILATATION AND CURETTAGE /HYSTEROSCOPY;  Surgeon: Marcelle Overlie, MD;  Location: [facility];  Service: Gynecology;  Laterality: N/A;   inguinal herniography     NASAL SINUS SURGERY     RADIOACTIVE SEED GUIDED EXCISIONAL BREAST BIOPSY Right 04/17/2018   Procedure: RIGHT BREAST SEED GUIDED EXCISIONAL BIOPSY;  Surgeon: Emelia Loron, MD;  Location: [facility];  Service: General;  Laterality: Right;   RE-EXCISION OF BREAST LUMPECTOMY Left 05/15/2018   Procedure: RE-EXCISION OF LEFT BREAST LUMPECTOMY, ASPIRATION OF LEFT AXILLARY SEROMA;  Surgeon: Emelia Loron, MD;  Location: [facility];  Service: General;  Laterality: Left;   T & A     TOOTH EXTRACTION     TOTAL KNEE ARTHROPLASTY Left 09/11/2017   Procedure: LEFT TOTAL KNEE ARTHROPLASTY;  Surgeon: Ollen Gross, MD;  Location: [facility];  Service: Orthopedics;  Laterality: Left;  with block   US ECHOCARDIOGRAPHY  07/17/2006   EF 55-60%   Patient Active Problem List   Diagnosis Date Noted   Spinal stenosis of lumbar region with radiculopathy 10/12/2021   Chronic postoperative pain 10/12/2021   Breast mass, right 03/27/2020   Hepatic steatosis 01/22/2020   Genetic testing 04/02/2018   Family history of breast cancer    Malignant neoplasm of upper-outer quadrant of left breast in female, estrogen receptor positive ([place]) 03/16/2018   OA (osteoarthritis) of knee 09/11/2017   Infrapatellar bursitis of right knee 04/29/2014   Pneumonia, aspiration ([place]) 12/28/2012   HYPOTHYROIDISM 11/15/2010   HYPERLIPIDEMIA 11/15/2010   DEPRESSION 11/15/2010   GERD  11/15/2010   CONSTIPATION 11/15/2010   GALLSTONES 11/15/2010   RENAL CYST 11/15/2010   INSOMNIA UNSPECIFIED 11/15/2010   COUGH 11/15/2010   ABDOMINAL PAIN OTHER SPECIFIED SITE 11/15/2010   TRANSAMINASES, SERUM, ELEVATED 11/15/2010    REFERRING DIAG: s/p Bilateral Lumpectomies with Left upper quarter Pain/swelling  THERAPY DIAG:  Malignant neoplasm of upper-outer quadrant of left breast in female, estrogen receptor positive (HCC)  Abnormal posture  Localized edema  Stiffness of left shoulder, not elsewhere classified  PERTINENT HISTORY: Left Breast:  Grade 1-2 IDC, ER 100% and PR 100%, Ki 67 < 1%, Her 2 Neg Ratio 1.61; Additional Bx: Grade 2 ILC ER/PR Pos Her 2 Neg, Ki 1-5% 04/17/2018: Bil lumpectomies: RT: ALH, Left: Grade 2 ILC, margins cleared with additional surgery Oncotype 14 (ROR 4%) 07/09/2018 - 08/03/2018 : XRT 09/11/2018- June 2020: Tamoxifen (cognitive issues) Genetics Neg 09/26/2019- 11/26/2019: Letrozole )cognitive issues) 12/24/2020- 02/2021: Exemestane discontinued due to AE Presently on Anastrazole   PRECAUTIONS: Other: sciatica, lymphedema risk, left TKA  SUBJECTIVE:  I am feeling better today. I think the left chest area is a little better. Still get intermittent sharp pains running through but overall pain is much better Are you having pain? Yes: NPRS scale: 0/10, tender to touch Pain location: left lateral breast Pain description: tightness, tenderness, sharp pain Aggravating factors: if dog or grandchild jumps on her,lifting., Relieving factors: not sure   OBJECTIVE: (objective measures completed at initial evaluation unless otherwise dated)  COGNITION:            Overall cognitive status: Within functional limits for tasks assessed    PALPATION: Tender left axillary region near incision   OBSERVATIONS / OTHER ASSESSMENTS: mild visible left breast swelling with fibrosis noted proximal to nipple. Healed incisions with mild thickening   SENSATION:             Light touch: Deficits mild from prior surgery               POSTURE: forward head, rounded shoulders, increased thoracic kyphosis   UPPER EXTREMITY AROM/PROM:   A/PROM RIGHT  04/05/2022    Shoulder extension 70  Shoulder flexion 135  Shoulder abduction 150 ( slight scaption)  Shoulder internal rotation 78  Shoulder external rotation 92                          (Blank rows = not tested)   A/PROM LEFT  04/05/2022  Shoulder extension 60  Shoulder flexion 132  Shoulder abduction 142(slight scaption)  Shoulder internal rotation 65  Shoulder external rotation 88                          (Blank rows =  not tested)     CERVICAL AROM: All within functional limits:          UPPER EXTREMITY STRENGTH: WFL       LYMPHEDEMA ASSESSMENTS:    SURGERY TYPE/DATE: 04/17/2018 Bilateral lumpectomies   NUMBER OF LYMPH NODES REMOVED: 3-4 on left only   CHEMOTHERAPY: no   RADIATION:yes   HORMONE TREATMENT: yes,    INFECTIONS: no   LYMPHEDEMA ASSESSMENTS:    LANDMARK RIGHT  04/05/2022  10 cm proximal to olecranon process 29.7  Olecranon process 25.5  10 cm proximal to ulnar styloid process 20.1  Just proximal to ulnar styloid process 15  Across hand at thumb web space 18.0  At base of 2nd digit 6.3  (Blank rows = not tested)   LANDMARK LEFT  04/05/2022  10 cm proximal to olecranon process 31.2  Olecranon process 26.0  10 cm proximal to ulnar styloid process 19.8  Just proximal to ulnar styloid process 15.3  Across hand at thumb web space 18.1  At base of 2nd digit 6.0  (Blank rows = not tested)           QUICK DASH SURVEY: 11%     TODAY'S TREATMENT  04/28/2022 Soft tissue mobilization in supine to left pectorals, lats and UT, and in Right SL to UT and scapular area with cocoa butter. PROM left shoulder flexion, scaption, abduction, ER with VC's to relax MLD: supine with head of table elevated; short neck, 5 breaths, bilateral axillary LN's, left inguinal LN's and  Left breast retracing steps with emphasis on area of firmness and most to lateral breast and retracing steps and ending with LN's. Educated pt while performing and had pt practice all steps   04/26/2022 Soft tissue mobilization in supine to left pectorals, lats and UT, and in Right SL to UT and scapular area with cocoa butter. PROM left shoulder flexion, scaption, abduction, ER with VC's to relax MLD: supine with head of table elevated; short neck, 5 breaths, bilateral axillary LN's, left inguinal LN's and Left breast retracing steps with emphasis on area of firmness and most to lateral breast and retracing steps and ending with LN's. Educated pt while performing.  04/21/2022 Soft tissue mobilization in supine to left pectorals, lats and UT, and in Right SL to UT and scapular area with cocoa butter. PROM left shoulder flexion, scaption, abduction, ER with VC's to relax MLD: supine with head of table elevated; short neck, 5 breaths, bilateral axillary LN's, left inguinal LN's and Left breast retracing steps with emphasis on area of firmness and most to lateral breast and retracing steps and ending with LN's. Educated pt while performing and had pt perform left breast MLD working toward left axillo-inguinal pathway. Therapeutic Exercise; AA shoulder flex x 5,star gazer x 5, supine wand scaption x 5,  Supine AROM x 5 flex, scaption, horizontal abd    04/19/2022  Soft tissue mobilization in supine to left pectorals, lats and UT, and in Right SL to UT and scapular area with cocoa butter. PROM left shoulder flexion, scaption, abduction, ER with VC's to relax MLD: supine with head of table elevated; short neck, bilateral axillary LN's, left inguinal LN's and Left breast retracing steps with emphasis on area of firmness and most to lateral breast and retracing steps and ending with LN's. Educated pt while performing. Therapeutic Exercise; supine wand scaption x 5, Supine lower trunk rotation x 5. Updated  HEP  04/13/2022 MANUAL: Soft tissue mobilization in supine to left pectorals, lats and  UT, and in Right SL to UT and scapular area with cocoa butter. PROM left shoulder flexion, scaption, abduction, ER with VC's to relax. Scar massage to incision at lateral breast in all directions  2 half inch foam pads in TG soft made for under bra band and 1 chip pack made for fibrotic area of left breast to place in bra.  THERAPEUTIC EXERCISE:  Pt. Educated in 4 post op exercises with flexion and stargazer done in supine, and scapular retraction and wall slide in standing,  Lower trunk rotation with outstretched arms done mainly with knees to right to stretch left lateral trunk. 04/05/2022 Pt was educated in left breast swelling and given information and script for a compression bra. Will place chip pack in area of fibrosis when received   PATIENT EDUCATION:  Education details:4 post op exercises, flexion and stargazer , self Left breast MLD to axillo-inguinal pathway Person educated: Patient Education method: Explanation Education comprehension: verbalized understanding, demonstrated     HOME EXERCISE PROGRAM: 04/13/2022; 4 post op exercises, left Breast MLD, lower trunk rotation stretch  ASSESSMENT:   CLINICAL IMPRESSION: Pt.  reports overall pain is much better than when she started and she is compliant with HEP. She does still have increased tissue tension/tenderness especially in pectorals,lats, serratus.  She did well with left Breast MLD techniques but does require review.  She had worked toward the right axillary LN's as instructed, but then started to work her way back to the left again. She was reminded we are always working away from the left breast area. She had mild left lateral breast discomfort after she performed MLD, which went away quickly.  OBJECTIVE IMPAIRMENTS decreased ROM, increased edema, increased fascial restrictions, and pain.    ACTIVITY LIMITATIONS yard work and reaching  activities .    PERSONAL FACTORS 1-2 comorbidities: prior cancer with radiation, sciatica, Left TKA with pain  are also affecting patient's functional outcome.      REHAB POTENTIAL: Good   CLINICAL DECISION MAKING: Stable/uncomplicated   EVALUATION COMPLEXITY: Low   GOALS: Goals reviewed with patient? Yes   SHORT TERM GOALS: Target date: 04/26/2022   Pt will be independent and compliant with HEP for left shoulder ROM/strength Baseline: Goal status: MET       LONG TERM GOALS: Target date: 05/17/2022   Pt will have decreased complaints of shooting pain in breast by 50% Baseline:  Goal status: INITIAL   2.  Pt will have decreased left breast swelling/fibrosis by 50% or greater Baseline:  Goal status: INITIAL   3.  Pt will purchase compression bra/sports bra to decrease breast swelling Baseline:  Goal status: INITIAL   4.  Pt will have left shoulder ROM WNL as compared to right UE  Baseline:  Goal status: INITIAL   5.  Pt will be independent with left breast MLD Baseline:  Goal status: INITIAL       PLAN: PT FREQUENCY: 2x/week   PT DURATION: 6 weeks   PLANNED INTERVENTIONS: Therapeutic exercises, Therapeutic activity, Patient/Family education, Joint mobilization, Orthotic/Fit training, Manual lymph drainage, scar mobilization, Taping, and Manual therapy   PLAN FOR NEXT SESSION:  ,PROM, STM prn , continue left breast MLD and review with pt., Pectoral stretches       Claris Pong, PT 04/28/2022, 5:06 PM

## 2022-05-02 ENCOUNTER — Encounter: Payer: Self-pay | Admitting: Physical Therapy

## 2022-05-02 ENCOUNTER — Ambulatory Visit: Payer: Medicare Other | Admitting: Physical Therapy

## 2022-05-02 DIAGNOSIS — Z170 Estrogen receptor positive status [ER+]: Secondary | ICD-10-CM | POA: Diagnosis not present

## 2022-05-02 DIAGNOSIS — R293 Abnormal posture: Secondary | ICD-10-CM | POA: Diagnosis not present

## 2022-05-02 DIAGNOSIS — R600 Localized edema: Secondary | ICD-10-CM | POA: Diagnosis not present

## 2022-05-02 DIAGNOSIS — C50412 Malignant neoplasm of upper-outer quadrant of left female breast: Secondary | ICD-10-CM | POA: Diagnosis not present

## 2022-05-02 DIAGNOSIS — M25612 Stiffness of left shoulder, not elsewhere classified: Secondary | ICD-10-CM

## 2022-05-02 NOTE — Therapy (Signed)
OUTPATIENT PHYSICAL THERAPY TREATMENT NOTE   Patient Name: [name] MRN: [id number] DOB:1946/01/01, 76 y.o., female Today's Date: 05/02/2022  PCP: Creola Corn  REFERRING PROVIDER: Emelia Loron   END OF SESSION:   PT End of Session - 05/02/22 1304     Visit Number 7    Number of Visits 12    Date for PT Re-Evaluation 05/17/22    PT Start Time 1305    PT Stop Time 1358    PT Time Calculation (min) 53 min    Activity Tolerance Patient tolerated treatment well    Behavior During Therapy WFL for tasks assessed/performed             Past Medical History:  Diagnosis Date   Arthritis    hands and knees   Complication of anesthesia    aspiration pna; following a colonoscopy, pt requests head of bed elevated if possible.   Constipation    Cough    Depression    Dysrhythmia    RBBB on 06-06-17 ekg    Elevated liver function tests    Esophageal stricture    Family history of breast cancer    Gallstones    Genetic testing 04/02/2018   STAT Breast panel with reflex to Multi-Cancer panel (83 genes) @ [facility] - No pathogenic mutations detected   GERD (gastroesophageal reflux disease)    Hiatal hernia    History of kidney stones    History of radiation therapy 07/09/2018- 08/03/18   Left Breast/ 40.05 Gy in 15 fractions. Left Breast boost 10 Gy in 5 fractions.    Hyperlipemia    Hypothyroidism    Insomnia    Malignant neoplasm of upper-outer quadrant of left female breast (HCC)    Nephrolithiasis    Personal history of radiation therapy    Pneumonia 2015   aspirated after colonosopy   Renal cyst    Past Surgical History:  Procedure Laterality Date   BREAST BIOPSY Left 05/31/2019   mri   BREAST BIOPSY Left 04/2019   malignant   BREAST LUMPECTOMY Left 04/17/2018   malignant   BREAST LUMPECTOMY WITH RADIOACTIVE SEED AND SENTINEL LYMPH NODE BIOPSY Left 04/17/2018   Procedure: LEFT BREAST LUMPECTOMY WITH BRACKETED RADIOACTIVE SEEDS AND SENTINEL LYMPH NODE  BIOPSY;  Surgeon: Emelia Loron, MD;  Location: [facility];  Service: General;  Laterality: Left;   BREAST SURGERY Left    Lumpectomy   CARDIOVASCULAR STRESS TEST  07/19/2006   EF 70%, NO EVIDENCE OF ISCHEMIA   CHOLECYSTECTOMY  12/30/10   COLONOSCOPY     DILATION AND CURETTAGE OF UTERUS     HYSTEROSCOPY WITH D & C N/A 06/07/2018   Procedure: DILATATION AND CURETTAGE /HYSTEROSCOPY;  Surgeon: Marcelle Overlie, MD;  Location: [facility];  Service: Gynecology;  Laterality: N/A;   inguinal herniography     NASAL SINUS SURGERY     RADIOACTIVE SEED GUIDED EXCISIONAL BREAST BIOPSY Right 04/17/2018   Procedure: RIGHT BREAST SEED GUIDED EXCISIONAL BIOPSY;  Surgeon: Emelia Loron, MD;  Location: [facility];  Service: General;  Laterality: Right;   RE-EXCISION OF BREAST LUMPECTOMY Left 05/15/2018   Procedure: RE-EXCISION OF LEFT BREAST LUMPECTOMY, ASPIRATION OF LEFT AXILLARY SEROMA;  Surgeon: Emelia Loron, MD;  Location: [facility];  Service: General;  Laterality: Left;   T & A     TOOTH EXTRACTION     TOTAL KNEE ARTHROPLASTY Left 09/11/2017   Procedure: LEFT TOTAL KNEE ARTHROPLASTY;  Surgeon: Ollen Gross, MD;  Location: [facility];  Service: Orthopedics;  Laterality: Left;  with block   US ECHOCARDIOGRAPHY  07/17/2006   EF 55-60%   Patient Active Problem List   Diagnosis Date Noted   Spinal stenosis of lumbar region with radiculopathy 10/12/2021   Chronic postoperative pain 10/12/2021   Breast mass, right 03/27/2020   Hepatic steatosis 01/22/2020   Genetic testing 04/02/2018   Family history of breast cancer    Malignant neoplasm of upper-outer quadrant of left breast in female, estrogen receptor positive ([place]) 03/16/2018   OA (osteoarthritis) of knee 09/11/2017   Infrapatellar bursitis of right knee 04/29/2014   Pneumonia, aspiration ([place]) 12/28/2012   HYPOTHYROIDISM 11/15/2010   HYPERLIPIDEMIA 11/15/2010   DEPRESSION 11/15/2010   GERD  11/15/2010   CONSTIPATION 11/15/2010   GALLSTONES 11/15/2010   RENAL CYST 11/15/2010   INSOMNIA UNSPECIFIED 11/15/2010   COUGH 11/15/2010   ABDOMINAL PAIN OTHER SPECIFIED SITE 11/15/2010   TRANSAMINASES, SERUM, ELEVATED 11/15/2010    REFERRING DIAG: s/p Bilateral Lumpectomies with Left upper quarter Pain/swelling    THERAPY DIAG:  Abnormal posture  Localized edema  Stiffness of left shoulder, not elsewhere classified  Rationale for Evaluation and Treatment Rehabilitation  PERTINENT HISTORY:  Left Breast:  Grade 1-2 IDC, ER 100% and PR 100%, Ki 67 < 1%, Her 2 Neg Ratio 1.61; Additional Bx: Grade 2 ILC ER/PR Pos Her 2 Neg, Ki 1-5% 04/17/2018: Bil lumpectomies: RT: ALH, Left: Grade 2 ILC, margins cleared with additional surgery Oncotype 14 (ROR 4%) 07/09/2018 - 08/03/2018 : XRT 09/11/2018- June 2020: Tamoxifen (cognitive issues) Genetics Neg 09/26/2019- 11/26/2019: Letrozole )cognitive issues) 12/24/2020- 02/2021: Exemestane discontinued due to AE Presently on Anastrazole     PRECAUTIONS: : Other: sciatica, lymphedema risk, left TKA    SUBJECTIVE: pain says that her pain in sitting is about a 1/10 and when her dog jumps on it it goes up to about a 5/10. Pt is staying as active as she can  :     OBJECTIVE:  TODAY'S TREATMENT : Began with ROM is sitting with extra attention to posture and trunk rotation. Several reps of scapular rotation and movement Then pt to supine and sidelying for soft tissue work to anterior and  lateral chest with extra time spent on the trigger points and tightness in posterior shoulder area. Pt continues with painfull tightness in lateral and anterior chest that appears to be in pec minor area. She performed AROM to left shoulder in sidelying with a "pin and stretch" technique with my gentle pressure at axilla . Pt reports she felt better after session.  She was encouraged to continue deep breathing trying to get rib movment throughout her day.    04/28/2022 Soft tissue mobilization in supine to left pectorals, lats and UT, and in Right SL to UT and scapular area with cocoa butter. PROM left shoulder flexion, scaption, abduction, ER with VC's to relax MLD: supine with head of table elevated; short neck, 5 breaths, bilateral axillary LN's, left inguinal LN's and Left breast retracing steps with emphasis on area of firmness and most to lateral breast and retracing steps and ending with LN's. Educated pt while performing and had pt practice all steps   04/26/2022 Soft tissue mobilization in supine to left pectorals, lats and UT, and in Right SL to UT and scapular area with cocoa butter. PROM left shoulder flexion, scaption, abduction, ER with VC's to relax MLD: supine with head of table elevated; short neck, 5 breaths, bilateral axillary LN's, left inguinal LN's and  Left breast retracing steps with emphasis on area of firmness and most to lateral breast and retracing steps and ending with LN's. Educated pt while performing.  04/21/2022 Soft tissue mobilization in supine to left pectorals, lats and UT, and in Right SL to UT and scapular area with cocoa butter. PROM left shoulder flexion, scaption, abduction, ER with VC's to relax MLD: supine with head of table elevated; short neck, 5 breaths, bilateral axillary LN's, left inguinal LN's and Left breast retracing steps with emphasis on area of firmness and most to lateral breast and retracing steps and ending with LN's. Educated pt while performing and had pt perform left breast MLD working toward left axillo-inguinal pathway. Therapeutic Exercise; AA shoulder flex x 5,star gazer x 5, supine wand scaption x 5,  Supine AROM x 5 flex, scaption, horizontal abd    04/19/2022  Soft tissue mobilization in supine to left pectorals, lats and UT, and in Right SL to UT and scapular area with cocoa butter. PROM left shoulder flexion, scaption, abduction, ER with VC's to relax MLD: supine with head of  table elevated; short neck, bilateral axillary LN's, left inguinal LN's and Left breast retracing steps with emphasis on area of firmness and most to lateral breast and retracing steps and ending with LN's. Educated pt while performing. Therapeutic Exercise; supine wand scaption x 5, Supine lower trunk rotation x 5. Updated HEP  04/13/2022 MANUAL: Soft tissue mobilization in supine to left pectorals, lats and UT, and in Right SL to UT and scapular area with cocoa butter. PROM left shoulder flexion, scaption, abduction, ER with VC's to relax. Scar massage to incision at lateral breast in all directions  2 half inch foam pads in TG soft made for under bra band and 1 chip pack made for fibrotic area of left breast to place in bra.  THERAPEUTIC EXERCISE:  Pt. Educated in 4 post op exercises with flexion and stargazer done in supine, and scapular retraction and wall slide in standing,  Lower trunk rotation with outstretched arms done mainly with knees to right to stretch left lateral trunk. 04/05/2022 Pt was educated in left breast swelling and given information and script for a compression bra. Will place chip pack in area of fibrosis when received   PATIENT EDUCATION:  Education details:4 post op exercises, flexion and stargazer , self Left breast MLD to axillo-inguinal pathway Person educated: Patient Education method: Explanation Education comprehension: verbalized understanding, demonstrated     HOME EXERCISE PROGRAM: 04/13/2022; 4 post op exercises, left Breast MLD, lower trunk rotation stretch  ASSESSMENT:   CLINICAL IMPRESSION:  Pt is responding well to manual treatment, though she continues to have pain.  She feels she is better able to tolerate her daily activities with the treatment   OBJECTIVE IMPAIRMENTS decreased ROM, increased edema, increased fascial restrictions, and pain.    ACTIVITY LIMITATIONS yard work and reaching activities .    PERSONAL FACTORS 1-2 comorbidities: prior  cancer with radiation, sciatica, Left TKA with pain  are also affecting patient's functional outcome.      REHAB POTENTIAL: Good   CLINICAL DECISION MAKING: Stable/uncomplicated   EVALUATION COMPLEXITY: Low   GOALS: Goals reviewed with patient? Yes   SHORT TERM GOALS: Target date: 04/26/2022   Pt will be independent and compliant with HEP for left shoulder ROM/strength Baseline: Goal status: MET       LONG TERM GOALS: Target date: 05/17/2022   Pt will have decreased complaints of shooting pain in breast by 50% Baseline:  Goal status: INITIAL   2.  Pt will have decreased left breast swelling/fibrosis by 50% or greater Baseline:  Goal status: INITIAL   3.  Pt will purchase compression bra/sports bra to decrease breast swelling Baseline:  Goal status: INITIAL   4.  Pt will have left shoulder ROM WNL as compared to right UE  Baseline:  Goal status: INITIAL   5.  Pt will be independent with left breast MLD Baseline:  Goal status: INITIAL       PLAN: PT FREQUENCY: 2x/week   PT DURATION: 6 weeks   PLANNED INTERVENTIONS: Therapeutic exercises, Therapeutic activity, Patient/Family education, Joint mobilization, Orthotic/Fit training, Manual lymph drainage, scar mobilization, Taping, and Manual therapy   PLAN FOR NEXT SESSION:  ,PROM, STM prn , continue left breast MLD and review with pt., Pectoral stretches      Helene Kelp K. Owens Shark PT  Norwood Levo, PT 05/02/2022, 3:26 PM     Norwood Levo, PT 05/02/2022, 3:25 PM

## 2022-05-03 DIAGNOSIS — L821 Other seborrheic keratosis: Secondary | ICD-10-CM | POA: Diagnosis not present

## 2022-05-03 DIAGNOSIS — L814 Other melanin hyperpigmentation: Secondary | ICD-10-CM | POA: Diagnosis not present

## 2022-05-03 DIAGNOSIS — B351 Tinea unguium: Secondary | ICD-10-CM | POA: Diagnosis not present

## 2022-05-03 DIAGNOSIS — D485 Neoplasm of uncertain behavior of skin: Secondary | ICD-10-CM | POA: Diagnosis not present

## 2022-05-03 DIAGNOSIS — D225 Melanocytic nevi of trunk: Secondary | ICD-10-CM | POA: Diagnosis not present

## 2022-05-03 DIAGNOSIS — Z08 Encounter for follow-up examination after completed treatment for malignant neoplasm: Secondary | ICD-10-CM | POA: Diagnosis not present

## 2022-05-03 DIAGNOSIS — L570 Actinic keratosis: Secondary | ICD-10-CM | POA: Diagnosis not present

## 2022-05-03 DIAGNOSIS — Z85828 Personal history of other malignant neoplasm of skin: Secondary | ICD-10-CM | POA: Diagnosis not present

## 2022-05-04 ENCOUNTER — Ambulatory Visit: Payer: Medicare Other

## 2022-05-04 DIAGNOSIS — C50412 Malignant neoplasm of upper-outer quadrant of left female breast: Secondary | ICD-10-CM | POA: Diagnosis not present

## 2022-05-04 DIAGNOSIS — R600 Localized edema: Secondary | ICD-10-CM

## 2022-05-04 DIAGNOSIS — R293 Abnormal posture: Secondary | ICD-10-CM

## 2022-05-04 DIAGNOSIS — M25612 Stiffness of left shoulder, not elsewhere classified: Secondary | ICD-10-CM | POA: Diagnosis not present

## 2022-05-04 DIAGNOSIS — Z170 Estrogen receptor positive status [ER+]: Secondary | ICD-10-CM | POA: Diagnosis not present

## 2022-05-04 NOTE — Therapy (Signed)
OUTPATIENT PHYSICAL THERAPY TREATMENT NOTE   Patient Name: [name] MRN: [id number] DOB:[birth year], 76 y.o., female Today's Date: 05/04/2022  PCP: Shon Baton  REFERRING PROVIDER: Rolm Bookbinder   END OF SESSION:   PT End of Session - 05/04/22 1500     Visit Number 8    Number of Visits 12    Date for PT Re-Evaluation 05/17/22    PT Start Time 1502    PT Stop Time 1600    PT Time Calculation (min) 58 min    Activity Tolerance Patient tolerated treatment well    Behavior During Therapy WFL for tasks assessed/performed             Past Medical History:  Diagnosis Date   Arthritis    hands and knees   Complication of anesthesia    aspiration pna; following a colonoscopy, pt requests head of bed elevated if possible.   Constipation    Cough    Depression    Dysrhythmia    RBBB on 06-06-17 ekg    Elevated liver function tests    Esophageal stricture    Family history of breast cancer    Gallstones    Genetic testing 04/02/2018   STAT Breast panel with reflex to Multi-Cancer panel (83 genes) @ [facility] - No pathogenic mutations detected   GERD (gastroesophageal reflux disease)    Hiatal hernia    History of kidney stones    History of radiation therapy 07/09/2018- 08/03/18   Left Breast/ 40.05 Gy in 15 fractions. Left Breast boost 10 Gy in 5 fractions.    Hyperlipemia    Hypothyroidism    Insomnia    Malignant neoplasm of upper-outer quadrant of left female breast ([place])    Nephrolithiasis    Personal history of radiation therapy    Pneumonia 2015   aspirated after colonosopy   Renal cyst    Past Surgical History:  Procedure Laterality Date   BREAST BIOPSY Left 05/31/2019   mri   BREAST BIOPSY Left 04/2019   malignant   BREAST LUMPECTOMY Left 04/17/2018   malignant   BREAST LUMPECTOMY WITH RADIOACTIVE SEED AND SENTINEL LYMPH NODE BIOPSY Left 04/17/2018   Procedure: LEFT BREAST LUMPECTOMY WITH BRACKETED RADIOACTIVE SEEDS AND SENTINEL LYMPH NODE  BIOPSY;  Surgeon: Rolm Bookbinder, MD;  Location: [facility];  Service: General;  Laterality: Left;   BREAST SURGERY Left    Lumpectomy   CARDIOVASCULAR STRESS TEST  07/19/2006   EF 70%, NO EVIDENCE OF ISCHEMIA   CHOLECYSTECTOMY  12/30/10   COLONOSCOPY     DILATION AND CURETTAGE OF UTERUS     HYSTEROSCOPY WITH D & C N/A 06/07/2018   Procedure: DILATATION AND CURETTAGE /HYSTEROSCOPY;  Surgeon: [name], MD;  Location: [facility];  Service: Gynecology;  Laterality: N/A;   inguinal herniography     NASAL SINUS SURGERY     RADIOACTIVE SEED GUIDED EXCISIONAL BREAST BIOPSY Right 04/17/2018   Procedure: RIGHT BREAST SEED GUIDED EXCISIONAL BIOPSY;  Surgeon: Rolm Bookbinder, MD;  Location: [facility];  Service: General;  Laterality: Right;   RE-EXCISION OF BREAST LUMPECTOMY Left 05/15/2018   Procedure: RE-EXCISION OF LEFT BREAST LUMPECTOMY, ASPIRATION OF LEFT AXILLARY SEROMA;  Surgeon: Rolm Bookbinder, MD;  Location: [facility];  Service: General;  Laterality: Left;   T & A     TOOTH EXTRACTION     TOTAL KNEE ARTHROPLASTY Left 09/11/2017   Procedure: LEFT TOTAL KNEE ARTHROPLASTY;  Surgeon: Gaynelle Arabian, MD;  Location: [facility];  Service: Orthopedics;  Laterality: Left;  with block   US ECHOCARDIOGRAPHY  07/17/2006   EF 55-60%   Patient Active Problem List   Diagnosis Date Noted   Spinal stenosis of lumbar region with radiculopathy 10/12/2021   Chronic postoperative pain 10/12/2021   Breast mass, right 03/27/2020   Hepatic steatosis 01/22/2020   Genetic testing 04/02/2018   Family history of breast cancer    Malignant neoplasm of upper-outer quadrant of left breast in female, estrogen receptor positive ([place]) 03/16/2018   OA (osteoarthritis) of knee 09/11/2017   Infrapatellar bursitis of right knee 04/29/2014   Pneumonia, aspiration ([place]) 12/28/2012   HYPOTHYROIDISM 11/15/2010   HYPERLIPIDEMIA 11/15/2010   DEPRESSION 11/15/2010   GERD  11/15/2010   CONSTIPATION 11/15/2010   GALLSTONES 11/15/2010   RENAL CYST 11/15/2010   INSOMNIA UNSPECIFIED 11/15/2010   COUGH 11/15/2010   ABDOMINAL PAIN OTHER SPECIFIED SITE 11/15/2010   TRANSAMINASES, SERUM, ELEVATED 11/15/2010    REFERRING DIAG: s/p Bilateral Lumpectomies with Left upper quarter Pain/swelling    THERAPY DIAG:  Abnormal posture  Localized edema  Stiffness of left shoulder, not elsewhere classified  Malignant neoplasm of upper-outer quadrant of left breast in female, estrogen receptor positive ([place])  Rationale for Evaluation and Treatment Rehabilitation  PERTINENT HISTORY:  Left Breast:  Grade 1-2 IDC, ER 100% and PR 100%, Ki 67 < 1%, Her 2 Neg Ratio 1.61; Additional Bx: Grade 2 ILC ER/PR Pos Her 2 Neg, Ki 1-5% 04/17/2018: Bil lumpectomies: RT: ALH, Left: Grade 2 ILC, margins cleared with additional surgery Oncotype 14 (ROR 4%) 07/09/2018 - 08/03/2018 : XRT 09/11/2018- June 2020: Tamoxifen (cognitive issues) Genetics Neg 09/26/2019- 11/26/2019: Letrozole )cognitive issues) 12/24/2020- 02/2021: Exemestane discontinued due to AE Presently on Anastrazole     PRECAUTIONS: : Other: sciatica, lymphedema risk, left TKA    SUBJECTIVE: I went to stretch zone this am for my legs.  I was sore after last visit where she did some deep pressure under my arm pit, but paian is still the same. PAIN:  Are you having pain? Yes NPRS scale: 5/10 Pain location: left axillary and lateral trunk Pain orientation: Left and Lateral  PAIN TYPE: aching Pain description: intermittent  Aggravating factors: reaching,lifting, laying on left side since Monday visit Relieving factors: Soft tissue work,   :     OBJECTIVE:  TODAY'S TREATMENT :  05/04/2022  Soft tissue mobilization in supine to left pectorals, lats and UT, and in Right SL to UT and scapular area with cocoa butter. PROM left shoulder flexion, scaption, abduction, ER with VC's to relax MLD: supine with head of table  elevated; short neck, 5 breaths, bilateral axillary LN's, left inguinal LN's and Left breast retracing steps with emphasis on area of firmness and most to lateral breast and retracing steps and ending with LN's. Educated pt is standing pectoral stretches in doorway and single arm chest at wall and updated HEP. Performed each x 2 for 20 sec  05/02/2022 Began with ROM is sitting with extra attention to posture and trunk rotation. Several reps of scapular rotation and movement Then pt to supine and sidelying for soft tissue work to anterior and  lateral chest with extra time spent on the trigger points and tightness in posterior shoulder area. Pt continues with painfull tightness in lateral and anterior chest that appears to be in pec minor area. She performed AROM to left shoulder in sidelying with a "pin and stretch" technique with my gentle pressure at axilla . Pt  reports she felt better after session.  She was encouraged to continue deep breathing trying to get rib movment throughout her day.   04/28/2022 Soft tissue mobilization in supine to left pectorals, lats and UT, and in Right SL to UT and scapular area with cocoa butter. PROM left shoulder flexion, scaption, abduction, ER with VC's to relax MLD: supine with head of table elevated; short neck, 5 breaths, bilateral axillary LN's, left inguinal LN's and Left breast retracing steps with emphasis on area of firmness and most to lateral breast and retracing steps and ending with LN's. Educated pt while performing and had pt practice all steps   04/26/2022 Soft tissue mobilization in supine to left pectorals, lats and UT, and in Right SL to UT and scapular area with cocoa butter. PROM left shoulder flexion, scaption, abduction, ER with VC's to relax MLD: supine with head of table elevated; short neck, 5 breaths, bilateral axillary LN's, left inguinal LN's and Left breast retracing steps with emphasis on area of firmness and most to lateral breast and  retracing steps and ending with LN's. Educated pt while performing.  04/21/2022 Soft tissue mobilization in supine to left pectorals, lats and UT, and in Right SL to UT and scapular area with cocoa butter. PROM left shoulder flexion, scaption, abduction, ER with VC's to relax MLD: supine with head of table elevated; short neck, 5 breaths, bilateral axillary LN's, left inguinal LN's and Left breast retracing steps with emphasis on area of firmness and most to lateral breast and retracing steps and ending with LN's. Educated pt while performing and had pt perform left breast MLD working toward left axillo-inguinal pathway. Therapeutic Exercise; AA shoulder flex x 5,star gazer x 5, supine wand scaption x 5,  Supine AROM x 5 flex, scaption, horizontal abd    04/19/2022  Soft tissue mobilization in supine to left pectorals, lats and UT, and in Right SL to UT and scapular area with cocoa butter. PROM left shoulder flexion, scaption, abduction, ER with VC's to relax MLD: supine with head of table elevated; short neck, bilateral axillary LN's, left inguinal LN's and Left breast retracing steps with emphasis on area of firmness and most to lateral breast and retracing steps and ending with LN's. Educated pt while performing. Therapeutic Exercise; supine wand scaption x 5, Supine lower trunk rotation x 5. Updated HEP    PATIENT EDUCATION:  Education details:4 post op exercises, flexion and stargazer , self Left breast MLD to axillo-inguinal pathway Person educated: Patient Education method: Explanation Education comprehension: verbalized understanding, demonstrated     HOME EXERCISE PROGRAM: 04/13/2022; 4 post op exercises, left Breast MLD, lower trunk rotation stretch  ASSESSMENT:   CLINICAL IMPRESSION:  Pt is responding well to manual treatment, though she continues to have pain.  She feels she is better able to tolerate her daily activities with the treatment   OBJECTIVE IMPAIRMENTS decreased  ROM, increased edema, increased fascial restrictions, and pain.    ACTIVITY LIMITATIONS yard work and reaching activities .    PERSONAL FACTORS 1-2 comorbidities: prior cancer with radiation, sciatica, Left TKA with pain  are also affecting patient's functional outcome.      REHAB POTENTIAL: Good   CLINICAL DECISION MAKING: Stable/uncomplicated   EVALUATION COMPLEXITY: Low   GOALS: Goals reviewed with patient? Yes   SHORT TERM GOALS: Target date: 04/26/2022   Pt will be independent and compliant with HEP for left shoulder ROM/strength Baseline: Goal status: MET       LONG TERM GOALS:  Target date: 05/17/2022   Pt will have decreased complaints of shooting pain in breast by 50% Baseline:  Goal status: INITIAL   2.  Pt will have decreased left breast swelling/fibrosis by 50% or greater Baseline:  Goal status: INITIAL   3.  Pt will purchase compression bra/sports bra to decrease breast swelling Baseline:  Goal status: MET   4.  Pt will have left shoulder ROM WNL as compared to right UE  Baseline:  Goal status: INITIAL   5.  Pt will be independent with left breast MLD Baseline:  Goal status: INITIAL       PLAN: PT FREQUENCY: 2x/week   PT DURATION: 6 weeks   PLANNED INTERVENTIONS: Therapeutic exercises, Therapeutic activity, Patient/Family education, Joint mobilization, Orthotic/Fit training, Manual lymph drainage, scar mobilization, Taping, and Manual therapy   PLAN FOR NEXT SESSION:  ,PROM, STM prn , continue left breast MLD and review with pt., Pectoral stretches       Claris Pong, PT 05/04/2022, 5:16 PM     Claris Pong, PT 05/04/2022, 5:16 PM

## 2022-05-13 ENCOUNTER — Ambulatory Visit: Payer: Medicare Other | Attending: General Surgery

## 2022-05-13 DIAGNOSIS — M25612 Stiffness of left shoulder, not elsewhere classified: Secondary | ICD-10-CM | POA: Diagnosis not present

## 2022-05-13 DIAGNOSIS — C50412 Malignant neoplasm of upper-outer quadrant of left female breast: Secondary | ICD-10-CM | POA: Insufficient documentation

## 2022-05-13 DIAGNOSIS — Z170 Estrogen receptor positive status [ER+]: Secondary | ICD-10-CM | POA: Insufficient documentation

## 2022-05-13 DIAGNOSIS — R600 Localized edema: Secondary | ICD-10-CM | POA: Insufficient documentation

## 2022-05-13 DIAGNOSIS — R293 Abnormal posture: Secondary | ICD-10-CM | POA: Insufficient documentation

## 2022-05-13 NOTE — Therapy (Signed)
OUTPATIENT PHYSICAL THERAPY TREATMENT NOTE   Patient Name: [name] MRN: [id number] DOB:1946/05/26, 76 y.o., female Today's Date: 05/13/2022  PCP: Shon Baton  REFERRING PROVIDER: Rolm Bookbinder   END OF SESSION:   PT End of Session - 05/13/22 1102     Visit Number 9    Number of Visits 12    Date for PT Re-Evaluation 05/17/22    PT Start Time 1103    PT Stop Time 1155    PT Time Calculation (min) 52 min    Activity Tolerance Patient tolerated treatment well    Behavior During Therapy WFL for tasks assessed/performed             Past Medical History:  Diagnosis Date   Arthritis    hands and knees   Complication of anesthesia    aspiration pna; following a colonoscopy, pt requests head of bed elevated if possible.   Constipation    Cough    Depression    Dysrhythmia    RBBB on 06-06-17 ekg    Elevated liver function tests    Esophageal stricture    Family history of breast cancer    Gallstones    Genetic testing 04/02/2018   STAT Breast panel with reflex to Multi-Cancer panel (83 genes) @ [facility] - No pathogenic mutations detected   GERD (gastroesophageal reflux disease)    Hiatal hernia    History of kidney stones    History of radiation therapy 07/09/2018- 08/03/18   Left Breast/ 40.05 Gy in 15 fractions. Left Breast boost 10 Gy in 5 fractions.    Hyperlipemia    Hypothyroidism    Insomnia    Malignant neoplasm of upper-outer quadrant of left female breast ([place])    Nephrolithiasis    Personal history of radiation therapy    Pneumonia 2015   aspirated after colonosopy   Renal cyst    Past Surgical History:  Procedure Laterality Date   BREAST BIOPSY Left 05/31/2019   mri   BREAST BIOPSY Left 04/2019   malignant   BREAST LUMPECTOMY Left 04/17/2018   malignant   BREAST LUMPECTOMY WITH RADIOACTIVE SEED AND SENTINEL LYMPH NODE BIOPSY Left 04/17/2018   Procedure: LEFT BREAST LUMPECTOMY WITH BRACKETED RADIOACTIVE SEEDS AND SENTINEL LYMPH NODE BIOPSY;   Surgeon: Rolm Bookbinder, MD;  Location: [facility];  Service: General;  Laterality: Left;   BREAST SURGERY Left    Lumpectomy   CARDIOVASCULAR STRESS TEST  07/19/2006   EF 70%, NO EVIDENCE OF ISCHEMIA   CHOLECYSTECTOMY  12/30/10   COLONOSCOPY     DILATION AND CURETTAGE OF UTERUS     HYSTEROSCOPY WITH D & C N/A 06/07/2018   Procedure: DILATATION AND CURETTAGE /HYSTEROSCOPY;  Surgeon: [name], MD;  Location: [facility];  Service: Gynecology;  Laterality: N/A;   inguinal herniography     NASAL SINUS SURGERY     RADIOACTIVE SEED GUIDED EXCISIONAL BREAST BIOPSY Right 04/17/2018   Procedure: RIGHT BREAST SEED GUIDED EXCISIONAL BIOPSY;  Surgeon: Rolm Bookbinder, MD;  Location: [facility];  Service: General;  Laterality: Right;   RE-EXCISION OF BREAST LUMPECTOMY Left 05/15/2018   Procedure: RE-EXCISION OF LEFT BREAST LUMPECTOMY, ASPIRATION OF LEFT AXILLARY SEROMA;  Surgeon: Rolm Bookbinder, MD;  Location: [facility];  Service: General;  Laterality: Left;   T & A     TOOTH EXTRACTION     TOTAL KNEE ARTHROPLASTY Left 09/11/2017   Procedure: LEFT TOTAL KNEE ARTHROPLASTY;  Surgeon: Gaynelle Arabian, MD;  Location: [facility];  Service: Orthopedics;  Laterality: Left;  with block   US ECHOCARDIOGRAPHY  07/17/2006   EF 55-60%   Patient Active Problem List   Diagnosis Date Noted   Spinal stenosis of lumbar region with radiculopathy 10/12/2021   Chronic postoperative pain 10/12/2021   Breast mass, right 03/27/2020   Hepatic steatosis 01/22/2020   Genetic testing 04/02/2018   Family history of breast cancer    Malignant neoplasm of upper-outer quadrant of left breast in female, estrogen receptor positive ([place]) 03/16/2018   OA (osteoarthritis) of knee 09/11/2017   Infrapatellar bursitis of right knee 04/29/2014   Pneumonia, aspiration ([place]) 12/28/2012   HYPOTHYROIDISM 11/15/2010   HYPERLIPIDEMIA 11/15/2010   DEPRESSION 11/15/2010   GERD 11/15/2010    CONSTIPATION 11/15/2010   GALLSTONES 11/15/2010   RENAL CYST 11/15/2010   INSOMNIA UNSPECIFIED 11/15/2010   COUGH 11/15/2010   ABDOMINAL PAIN OTHER SPECIFIED SITE 11/15/2010   TRANSAMINASES, SERUM, ELEVATED 11/15/2010    REFERRING DIAG: s/p Bilateral Lumpectomies with Left upper quarter Pain/swelling    THERAPY DIAG:  Abnormal posture  Localized edema  Stiffness of left shoulder, not elsewhere classified  Rationale for Evaluation and Treatment Rehabilitation  PERTINENT HISTORY:  Left Breast:  Grade 1-2 IDC, ER 100% and PR 100%, Ki 67 < 1%, Her 2 Neg Ratio 1.61; Additional Bx: Grade 2 ILC ER/PR Pos Her 2 Neg, Ki 1-5% 04/17/2018: Bil lumpectomies: RT: ALH, Left: Grade 2 ILC, margins cleared with additional surgery Oncotype 14 (ROR 4%) 07/09/2018 - 08/03/2018 : XRT 09/11/2018- June 2020: Tamoxifen (cognitive issues) Genetics Neg 09/26/2019- 11/26/2019: Letrozole )cognitive issues) 12/24/2020- 02/2021: Exemestane discontinued due to AE Presently on Anastrazole     PRECAUTIONS: : Other: sciatica, lymphedema risk, left TKA    SUBJECTIVE: It seems like the more I do the more it hurts . I feel like I have regressed since last week.    PAIN:  Are you having pain? Yes NPRS scale: 6/10 Pain location: left axillary and lateral trunk Pain orientation: Left and Lateral  PAIN TYPE: aching Pain description: intermittent  Aggravating factors: reaching,lifting, laying on left side since Monday visit Relieving factors: Soft tissue work,   :     OBJECTIVE:  TODAY'S TREATMENT :  05/13/2022  Soft tissue mobilization in supine to left pectorals, lats and UT, and in Right SL to UT and scapular area with cocoa butter. PROM left shoulder flexion, scaption, abduction, ER with VC's to relax Supine scapular series yellow flexion, horizontal abduction and ER x 5 and standing scapular retraction and horizontal abduction x 5. Updated pts HEP with written and pictorial  instructions  05/04/2022  Soft tissue mobilization in supine to left pectorals, lats and UT, and in Right SL to UT and scapular area with cocoa butter. PROM left shoulder flexion, scaption, abduction, ER with VC's to relax MLD: supine with head of table elevated; short neck, 5 breaths, bilateral axillary LN's, left inguinal LN's and Left breast retracing steps with emphasis on area of firmness and most to lateral breast and retracing steps and ending with LN's. Educated pt is standing pectoral stretches in doorway and single arm chest at wall and updated HEP. Performed each x 2 for 20 sec  05/02/2022 Began with ROM is sitting with extra attention to posture and trunk rotation. Several reps of scapular rotation and movement Then pt to supine and sidelying for soft tissue work to anterior and  lateral chest with extra time spent on the trigger points and tightness in posterior shoulder  area. Pt continues with painfull tightness in lateral and anterior chest that appears to be in pec minor area. She performed AROM to left shoulder in sidelying with a "pin and stretch" technique with my gentle pressure at axilla . Pt reports she felt better after session.  She was encouraged to continue deep breathing trying to get rib movment throughout her day.   04/28/2022 Soft tissue mobilization in supine to left pectorals, lats and UT, and in Right SL to UT and scapular area with cocoa butter. PROM left shoulder flexion, scaption, abduction, ER with VC's to relax MLD: supine with head of table elevated; short neck, 5 breaths, bilateral axillary LN's, left inguinal LN's and Left breast retracing steps with emphasis on area of firmness and most to lateral breast and retracing steps and ending with LN's. Educated pt while performing and had pt practice all steps   04/26/2022 Soft tissue mobilization in supine to left pectorals, lats and UT, and in Right SL to UT and scapular area with cocoa butter. PROM left shoulder  flexion, scaption, abduction, ER with VC's to relax MLD: supine with head of table elevated; short neck, 5 breaths, bilateral axillary LN's, left inguinal LN's and Left breast retracing steps with emphasis on area of firmness and most to lateral breast and retracing steps and ending with LN's. Educated pt while performing.  PATIENT EDUCATION:  Education details:Access Code: [email or web address] and supine scap stabs except sword URL: [email or web address] Date: 05/13/2022 Prepared by: Cheral Almas  Exercises - Scapular Retraction with Resistance  - 1 x daily - 3 x weekly - 1 sets - 5 reps - Scapular Retraction with Resistance Advanced  - 1 x daily - 3 x weekly - 1 sets - 5 reps Person educated: Patient Education method: Explanation Education comprehension: verbalized understanding, demonstrated     HOME EXERCISE PROGRAM: 04/13/2022; 4 post op exercises, left Breast MLD, lower trunk rotation stretch, supine scap series except sword with yellow x 5, standing scap. Retraction  and extension x 5  ASSESSMENT:   CLINICAL IMPRESSION:  Pt felt like she regressed after adding new pectoral stretches last week and she has remained very sore. Its possible she has been over stretching . She will discontinue the chest stretches temporarily and we will start with just 1 stretch again next week. She did well with yellow theraband exs and was advised to do them in a comfortable ROM with a day in between. OBJECTIVE IMPAIRMENTS decreased ROM, increased edema, increased fascial restrictions, and pain.    ACTIVITY LIMITATIONS yard work and reaching activities .    PERSONAL FACTORS 1-2 comorbidities: prior cancer with radiation, sciatica, Left TKA with pain  are also affecting patient's functional outcome.      REHAB POTENTIAL: Good   CLINICAL DECISION MAKING: Stable/uncomplicated   EVALUATION COMPLEXITY: Low   GOALS: Goals reviewed with patient? Yes   SHORT TERM GOALS: Target date: 04/26/2022    Pt will be independent and compliant with HEP for left shoulder ROM/strength Baseline: Goal status: MET       LONG TERM GOALS: Target date: 05/17/2022   Pt will have decreased complaints of shooting pain in breast by 50% Baseline:  Goal status: INITIAL   2.  Pt will have decreased left breast swelling/fibrosis by 50% or greater Baseline:  Goal status: INITIAL   3.  Pt will purchase compression bra/sports bra to decrease breast swelling Baseline:  Goal status: MET   4.  Pt will have left shoulder ROM WNL as compared to  right UE  Baseline:  Goal status: INITIAL   5.  Pt will be independent with left breast MLD Baseline:  Goal status: INITIAL       PLAN: PT FREQUENCY: 2x/week   PT DURATION: 6 weeks   PLANNED INTERVENTIONS: Therapeutic exercises, Therapeutic activity, Patient/Family education, Joint mobilization, Orthotic/Fit training, Manual lymph drainage, scar mobilization, Taping, and Manual therapy   PLAN FOR NEXT SESSION:  recert/DC,PROM, STM prn  review band, try adding in pectoral stretches 1 at a time,continue left breast MLD and review with pt.,        Claris Pong, PT 05/13/2022, 12:04 PM

## 2022-05-13 NOTE — Patient Instructions (Addendum)
Over Head Pull: Narrow and Wide Grip   Cancer Rehab [phone]   On back, knees bent, feet flat, band across thighs, elbows straight but relaxed. Pull hands apart (start). Keeping elbows straight, bring arms up and over head, hands toward floor. Keep pull steady on band. Hold momentarily. Return slowly, keeping pull steady, back to start. Then do same with a wider grip on the band (past shoulder width) Repeat _5-10__ times. Band color __yellow____   Side Pull: Double Arm   On back, knees bent, feet flat. Arms perpendicular to body, shoulder level, elbows straight but relaxed. Pull arms out to sides, elbows straight. Resistance band comes across collarbones, hands toward floor. Hold momentarily. Slowly return to starting position. Repeat _5-10__ times. Band color _yellow____   Shoulder Rotation: Double Arm   On back, knees bent, feet flat, elbows tucked at sides, bent 90, hands palms up. Pull hands apart and down toward floor, keeping elbows near sides. Hold momentarily. Slowly return to starting position. Repeat _5-10__ times. Band color __yellow____    

## 2022-05-17 ENCOUNTER — Ambulatory Visit: Payer: Medicare Other

## 2022-05-17 DIAGNOSIS — R293 Abnormal posture: Secondary | ICD-10-CM

## 2022-05-17 DIAGNOSIS — M25612 Stiffness of left shoulder, not elsewhere classified: Secondary | ICD-10-CM

## 2022-05-17 DIAGNOSIS — Z170 Estrogen receptor positive status [ER+]: Secondary | ICD-10-CM | POA: Diagnosis not present

## 2022-05-17 DIAGNOSIS — R600 Localized edema: Secondary | ICD-10-CM | POA: Diagnosis not present

## 2022-05-17 DIAGNOSIS — C50412 Malignant neoplasm of upper-outer quadrant of left female breast: Secondary | ICD-10-CM | POA: Diagnosis not present

## 2022-05-17 NOTE — Therapy (Signed)
OUTPATIENT PHYSICAL THERAPY TREATMENT NOTE   Patient Name: [name] MRN: [id number] DOB:Apr 08, 1946, 76 y.o., female Today's Date: 05/17/2022  PCP: Shon Baton  REFERRING PROVIDER: Rolm Bookbinder   END OF SESSION:   PT End of Session - 05/17/22 1402     Visit Number 10    Number of Visits 12    Date for PT Re-Evaluation 05/17/22    PT Start Time 5449    PT Stop Time 1450    PT Time Calculation (min) 45 min    Activity Tolerance Patient tolerated treatment well    Behavior During Therapy WFL for tasks assessed/performed             Past Medical History:  Diagnosis Date   Arthritis    hands and knees   Complication of anesthesia    aspiration pna; following a colonoscopy, pt requests head of bed elevated if possible.   Constipation    Cough    Depression    Dysrhythmia    RBBB on 06-06-17 ekg    Elevated liver function tests    Esophageal stricture    Family history of breast cancer    Gallstones    Genetic testing 04/02/2018   STAT Breast panel with reflex to Multi-Cancer panel (83 genes) @ [facility] - No pathogenic mutations detected   GERD (gastroesophageal reflux disease)    Hiatal hernia    History of kidney stones    History of radiation therapy 07/09/2018- 08/03/18   Left Breast/ 40.05 Gy in 15 fractions. Left Breast boost 10 Gy in 5 fractions.    Hyperlipemia    Hypothyroidism    Insomnia    Malignant neoplasm of upper-outer quadrant of left female breast ([place])    Nephrolithiasis    Personal history of radiation therapy    Pneumonia 2015   aspirated after colonosopy   Renal cyst    Past Surgical History:  Procedure Laterality Date   BREAST BIOPSY Left 05/31/2019   mri   BREAST BIOPSY Left 04/2019   malignant   BREAST LUMPECTOMY Left 04/17/2018   malignant   BREAST LUMPECTOMY WITH RADIOACTIVE SEED AND SENTINEL LYMPH NODE BIOPSY Left 04/17/2018   Procedure: LEFT BREAST LUMPECTOMY WITH BRACKETED RADIOACTIVE SEEDS AND SENTINEL LYMPH NODE  BIOPSY;  Surgeon: Rolm Bookbinder, MD;  Location: [facility];  Service: General;  Laterality: Left;   BREAST SURGERY Left    Lumpectomy   CARDIOVASCULAR STRESS TEST  07/19/2006   EF 70%, NO EVIDENCE OF ISCHEMIA   CHOLECYSTECTOMY  12/30/10   COLONOSCOPY     DILATION AND CURETTAGE OF UTERUS     HYSTEROSCOPY WITH D & C N/A 06/07/2018   Procedure: DILATATION AND CURETTAGE /HYSTEROSCOPY;  Surgeon: [name], MD;  Location: [facility];  Service: Gynecology;  Laterality: N/A;   inguinal herniography     NASAL SINUS SURGERY     RADIOACTIVE SEED GUIDED EXCISIONAL BREAST BIOPSY Right 04/17/2018   Procedure: RIGHT BREAST SEED GUIDED EXCISIONAL BIOPSY;  Surgeon: Rolm Bookbinder, MD;  Location: [facility];  Service: General;  Laterality: Right;   RE-EXCISION OF BREAST LUMPECTOMY Left 05/15/2018   Procedure: RE-EXCISION OF LEFT BREAST LUMPECTOMY, ASPIRATION OF LEFT AXILLARY SEROMA;  Surgeon: Rolm Bookbinder, MD;  Location: [facility];  Service: General;  Laterality: Left;   T & A     TOOTH EXTRACTION     TOTAL KNEE ARTHROPLASTY Left 09/11/2017   Procedure: LEFT TOTAL KNEE ARTHROPLASTY;  Surgeon: Gaynelle Arabian, MD;  Location: [facility];  Service: Orthopedics;  Laterality: Left;  with block   US ECHOCARDIOGRAPHY  07/17/2006   EF 55-60%   Patient Active Problem List   Diagnosis Date Noted   Spinal stenosis of lumbar region with radiculopathy 10/12/2021   Chronic postoperative pain 10/12/2021   Breast mass, right 03/27/2020   Hepatic steatosis 01/22/2020   Genetic testing 04/02/2018   Family history of breast cancer    Malignant neoplasm of upper-outer quadrant of left breast in female, estrogen receptor positive ([place]) 03/16/2018   OA (osteoarthritis) of knee 09/11/2017   Infrapatellar bursitis of right knee 04/29/2014   Pneumonia, aspiration ([place]) 12/28/2012   HYPOTHYROIDISM 11/15/2010   HYPERLIPIDEMIA 11/15/2010   DEPRESSION 11/15/2010   GERD  11/15/2010   CONSTIPATION 11/15/2010   GALLSTONES 11/15/2010   RENAL CYST 11/15/2010   INSOMNIA UNSPECIFIED 11/15/2010   COUGH 11/15/2010   ABDOMINAL PAIN OTHER SPECIFIED SITE 11/15/2010   TRANSAMINASES, SERUM, ELEVATED 11/15/2010    REFERRING DIAG: s/p Bilateral Lumpectomies with Left upper quarter Pain/swelling    THERAPY DIAG:  Abnormal posture  Localized edema  Stiffness of left shoulder, not elsewhere classified  Malignant neoplasm of upper-outer quadrant of left breast in female, estrogen receptor positive ([place])  Rationale for Evaluation and Treatment Rehabilitation  PERTINENT HISTORY:  Left Breast:  Grade 1-2 IDC, ER 100% and PR 100%, Ki 67 < 1%, Her 2 Neg Ratio 1.61; Additional Bx: Grade 2 ILC ER/PR Pos Her 2 Neg, Ki 1-5% 04/17/2018: Bil lumpectomies: RT: ALH, Left: Grade 2 ILC, margins cleared with additional surgery Oncotype 14 (ROR 4%) 07/09/2018 - 08/03/2018 : XRT 09/11/2018- June 2020: Tamoxifen (cognitive issues) Genetics Neg 09/26/2019- 11/26/2019: Letrozole )cognitive issues) 12/24/2020- 02/2021: Exemestane discontinued due to AE Presently on Anastrazole     PRECAUTIONS: : Other: sciatica, lymphedema risk, left TKA    SUBJECTIVE: I have felt much better since I stopped doing the chest stretch.  I do the MLD at home but I can't tell it changes anything.  The shooting pains are much better.  PAIN:  Are you having pain? 3/10 at rest, 6/62 with certain movements NPRS scale: 3-510 Pain location: left axillary and lateral trunk Pain orientation: Left and Lateral  PAIN TYPE: aching Pain description: intermittent  Aggravating factors: reaching,lifting, laying on left side  Relieving factors: Soft tissue work,   :  A/PROM RIGHT  04/05/2022   05/17/2022  Shoulder extension 70   Shoulder flexion 135 150  Shoulder abduction 150 ( slight scaption) 144  Shoulder internal rotation 78   Shoulder external rotation 92                           (Blank rows = not  tested)   A/PROM LEFT  04/05/2022 05/17/2022  Shoulder extension 60   Shoulder flexion 132 146  Shoulder abduction 142(slight scaption) 145  Shoulder internal rotation 65   Shoulder external rotation 88 90                          (Blank rows = not tested)       OBJECTIVE:  TODAY'S TREATMENT : 05/17/2022  Soft tissue mobilization in supine to left pectorals, lats and UT, and in Right SL to UT and scapular area with cocoa butter. PROM left shoulder flexion, scaption, abduction, ER with VC's to relax AA shoulder flexion and stargazer x 5 ea Supine scapular series yellow flexion, horizontal abduction and ER  x 5 and standing scapular retraction, extension and ER  x 5. Showed pt towel roll between shoulders to stretch pectorals. Measured ROM and checked goals. Discussed MLD and proper directions: no visible or palpable breast swelling today      05/13/2022  Soft tissue mobilization in supine to left pectorals, lats and UT, and in Right SL to UT and scapular area with cocoa butter. PROM left shoulder flexion, scaption, abduction, ER with VC's to relax Supine scapular series yellow flexion, horizontal abduction and ER x 5 and standing scapular retraction and horizontal abduction x 5. Updated pts HEP with written and pictorial instructions  05/04/2022  Soft tissue mobilization in supine to left pectorals, lats and UT, and in Right SL to UT and scapular area with cocoa butter. PROM left shoulder flexion, scaption, abduction, ER with VC's to relax MLD: supine with head of table elevated; short neck, 5 breaths, bilateral axillary LN's, left inguinal LN's and Left breast retracing steps with emphasis on area of firmness and most to lateral breast and retracing steps and ending with LN's. Educated pt is standing pectoral stretches in doorway and single arm chest at wall and updated HEP. Performed each x 2 for 20 sec PATIENT EDUCATION:  Education details:Access Code: [email or web address] and supine scap  stabs except sword URL: [email or web address] Date: 05/13/2022 Prepared by: Cheral Almas  Exercises - Scapular Retraction with Resistance  - 1 x daily - 3 x weekly - 1 sets - 5 reps - Scapular Retraction with Resistance Advanced  - 1 x daily - 3 x weekly - 1 sets - 5 reps Person educated: Patient Education method: Explanation Education comprehension: verbalized understanding, demonstrated     HOME EXERCISE PROGRAM: 04/13/2022; 4 post op exercises, left Breast MLD, lower trunk rotation stretch, supine scap series except sword with yellow x 5, standing scap. Retraction  and extension x 5  ASSESSMENT:   CLINICAL IMPRESSION:   Pt has achieved 5 of 6 goals established at initial evaluation. Her shoulder ROM is greatly improved and now WNL on the left.  She is no longer having sharp pain in her breast. She is independent with MLD, but there is no visible swelling or fibrosis today. She is independent with her HEP and feels ready to be discharged today.   ACTIVITY LIMITATIONS yard work and reaching activities .    PERSONAL FACTORS 1-2 comorbidities: prior cancer with radiation, sciatica, Left TKA with pain  are also affecting patient's functional outcome.      REHAB POTENTIAL: Good   CLINICAL DECISION MAKING: Stable/uncomplicated   EVALUATION COMPLEXITY: Low   GOALS: Goals reviewed with patient? Yes   SHORT TERM GOALS: Target date: 04/26/2022   Pt will be independent and compliant with HEP for left shoulder ROM/strength Baseline: Goal status: MET       LONG TERM GOALS: Target date: 05/17/2022   Pt will have decreased complaints of shooting pain in breast by 50% Baseline:  Goal status: MET 05/17/2022   2.  Pt will have decreased left breast swelling/fibrosis by 50% or greater Baseline:  Goal status: Not MET, pt isn't sure. No swelling observed today   3.  Pt will purchase compression bra/sports bra to decrease breast swelling Baseline:  Goal status: MET   4.   Pt will have left shoulder ROM WNL as compared to right UE  Baseline:  Goal status: MET   5.  Pt will be independent with left breast MLD Baseline:  Goal status: MET 05/17/2022  PLAN: PT FREQUENCY: 2x/week   PT DURATION: 6 weeks   PLANNED INTERVENTIONS: Therapeutic exercises, Therapeutic activity, Patient/Family education, Joint mobilization, Orthotic/Fit training, Manual lymph drainage, scar mobilization, Taping, and Manual therapy   PLAN FOR NEXT SESSION:   DC to independent HEP    PHYSICAL THERAPY DISCHARGE SUMMARY  Visits from Start of Care: 10  Current functional level related to goals / functional outcomes: Achieved 5/6 goals   Remaining deficits: none   Education / Equipment: Theraband, HEP instruction   Patient agrees to discharge. Patient goals were partially met. Patient is being discharged due to being pleased with the current functional level.   Claris Pong, PT 05/17/2022, 3:00 PM

## 2022-05-18 DIAGNOSIS — M5416 Radiculopathy, lumbar region: Secondary | ICD-10-CM | POA: Diagnosis not present

## 2022-05-18 DIAGNOSIS — K8020 Calculus of gallbladder without cholecystitis without obstruction: Secondary | ICD-10-CM | POA: Diagnosis not present

## 2022-05-18 DIAGNOSIS — J690 Pneumonitis due to inhalation of food and vomit: Secondary | ICD-10-CM | POA: Diagnosis not present

## 2022-05-18 DIAGNOSIS — F32A Depression, unspecified: Secondary | ICD-10-CM | POA: Diagnosis not present

## 2022-05-18 DIAGNOSIS — R430 Anosmia: Secondary | ICD-10-CM | POA: Diagnosis not present

## 2022-05-18 DIAGNOSIS — R7402 Elevation of levels of lactic acid dehydrogenase (LDH): Secondary | ICD-10-CM | POA: Diagnosis not present

## 2022-05-18 DIAGNOSIS — G8928 Other chronic postprocedural pain: Secondary | ICD-10-CM | POA: Diagnosis not present

## 2022-05-18 DIAGNOSIS — Z96652 Presence of left artificial knee joint: Secondary | ICD-10-CM | POA: Diagnosis not present

## 2022-05-18 DIAGNOSIS — R7401 Elevation of levels of liver transaminase levels: Secondary | ICD-10-CM | POA: Diagnosis not present

## 2022-05-18 DIAGNOSIS — E785 Hyperlipidemia, unspecified: Secondary | ICD-10-CM | POA: Diagnosis not present

## 2022-05-18 DIAGNOSIS — M48061 Spinal stenosis, lumbar region without neurogenic claudication: Secondary | ICD-10-CM | POA: Diagnosis not present

## 2022-05-18 DIAGNOSIS — K760 Fatty (change of) liver, not elsewhere classified: Secondary | ICD-10-CM | POA: Diagnosis not present

## 2022-05-18 DIAGNOSIS — T8484XA Pain due to internal orthopedic prosthetic devices, implants and grafts, initial encounter: Secondary | ICD-10-CM | POA: Diagnosis not present

## 2022-05-18 DIAGNOSIS — E039 Hypothyroidism, unspecified: Secondary | ICD-10-CM | POA: Diagnosis not present

## 2022-05-18 DIAGNOSIS — C50412 Malignant neoplasm of upper-outer quadrant of left female breast: Secondary | ICD-10-CM | POA: Diagnosis not present

## 2022-05-18 DIAGNOSIS — K219 Gastro-esophageal reflux disease without esophagitis: Secondary | ICD-10-CM | POA: Diagnosis not present

## 2022-05-19 ENCOUNTER — Ambulatory Visit: Payer: Medicare Other

## 2022-05-26 ENCOUNTER — Encounter: Payer: Self-pay | Admitting: Physical Medicine & Rehabilitation

## 2022-05-26 NOTE — Telephone Encounter (Signed)
I looked back at dates including the images captured on those dates,  Genicular nerve block was done 12/17/2021. No record or images of ESI on that date.  No blocks done in this office since that time  10/15/21 was L4-5 ESI

## 2022-05-27 ENCOUNTER — Other Ambulatory Visit: Payer: Self-pay | Admitting: Hematology and Oncology

## 2022-06-01 ENCOUNTER — Other Ambulatory Visit: Payer: Self-pay | Admitting: *Deleted

## 2022-06-01 MED ORDER — ANASTROZOLE 1 MG PO TABS: 1.0000 mg | ORAL_TABLET | ORAL | 1 refills | Status: DC

## 2022-07-20 DIAGNOSIS — Z79899 Other long term (current) drug therapy: Secondary | ICD-10-CM | POA: Diagnosis not present

## 2022-07-20 DIAGNOSIS — Z5181 Encounter for therapeutic drug level monitoring: Secondary | ICD-10-CM | POA: Diagnosis not present

## 2022-07-20 DIAGNOSIS — M1712 Unilateral primary osteoarthritis, left knee: Secondary | ICD-10-CM | POA: Diagnosis not present

## 2022-07-20 DIAGNOSIS — M5386 Other specified dorsopathies, lumbar region: Secondary | ICD-10-CM | POA: Diagnosis not present

## 2022-07-20 DIAGNOSIS — G894 Chronic pain syndrome: Secondary | ICD-10-CM | POA: Diagnosis not present

## 2022-08-02 ENCOUNTER — Ambulatory Visit: Payer: Medicare Other | Admitting: Hematology and Oncology

## 2022-08-03 DIAGNOSIS — L988 Other specified disorders of the skin and subcutaneous tissue: Secondary | ICD-10-CM | POA: Diagnosis not present

## 2022-08-03 DIAGNOSIS — L820 Inflamed seborrheic keratosis: Secondary | ICD-10-CM | POA: Diagnosis not present

## 2022-08-03 DIAGNOSIS — L570 Actinic keratosis: Secondary | ICD-10-CM | POA: Diagnosis not present

## 2022-08-03 DIAGNOSIS — L538 Other specified erythematous conditions: Secondary | ICD-10-CM | POA: Diagnosis not present

## 2022-08-03 DIAGNOSIS — R208 Other disturbances of skin sensation: Secondary | ICD-10-CM | POA: Diagnosis not present

## 2022-08-03 DIAGNOSIS — L578 Other skin changes due to chronic exposure to nonionizing radiation: Secondary | ICD-10-CM | POA: Diagnosis not present

## 2022-08-03 DIAGNOSIS — Z09 Encounter for follow-up examination after completed treatment for conditions other than malignant neoplasm: Secondary | ICD-10-CM | POA: Diagnosis not present

## 2022-08-10 DIAGNOSIS — M1712 Unilateral primary osteoarthritis, left knee: Secondary | ICD-10-CM | POA: Diagnosis not present

## 2022-08-30 DIAGNOSIS — G894 Chronic pain syndrome: Secondary | ICD-10-CM | POA: Diagnosis not present

## 2022-08-30 DIAGNOSIS — M1712 Unilateral primary osteoarthritis, left knee: Secondary | ICD-10-CM | POA: Diagnosis not present

## 2022-09-09 ENCOUNTER — Other Ambulatory Visit: Payer: Self-pay

## 2022-09-09 ENCOUNTER — Inpatient Hospital Stay: Payer: Medicare Other | Attending: Hematology and Oncology | Admitting: Hematology and Oncology

## 2022-09-09 DIAGNOSIS — Z170 Estrogen receptor positive status [ER+]: Secondary | ICD-10-CM | POA: Diagnosis not present

## 2022-09-09 DIAGNOSIS — C50412 Malignant neoplasm of upper-outer quadrant of left female breast: Secondary | ICD-10-CM | POA: Insufficient documentation

## 2022-09-09 NOTE — Progress Notes (Signed)
Patient Care Team: Shon Baton, MD as PCP - General [facility] Breed Denice Bors, MD as PCP - Cardiology (Cardiology) Rolm Bookbinder, MD as Consulting Physician (General Surgery) Eppie Gibson, MD as Attending Physician (Radiation Oncology) Lelon Perla, MD as Consulting Physician (Cardiology) [name], MD as Consulting Physician (Obstetrics and Gynecology) Jacquelynn Cree, PT as Physical Therapist (Physical Therapy) Donzetta Sprung., MD as Referring Physician (Sports Medicine) Caryl Comes, MD as Physician Assistant (Neurosurgery) Benay Pike, MD as Consulting Physician (Hematology and Oncology)  DIAGNOSIS:  Encounter Diagnosis  Name Primary?   Malignant neoplasm of upper-outer quadrant of left breast in female, estrogen receptor positive ([place])       CHIEF COMPLIANT: Estrogen receptor positive breast cancer on anastrozole  INTERVAL HISTORY: [name] is a  76 y.o. with above mention Estrogen receptor positive breast cancer. She presents to the clinic today for a follow-up. She states that when she space the anastrozole days apart and seems to work better. Reports that she doesn't have a lot of energy. She is not able to do the daily activities that she use to do. She does sit a lot because she is in so much pain when she walks.   ALLERGIES:  is allergic to oxycontin [oxycodone hcl] and codeine.  MEDICATIONS:  Current Outpatient Medications  Medication Sig Dispense Refill   lidocaine (LIDODERM) 5 % Place onto the skin.     anastrozole (ARIMIDEX) 1 MG tablet Take 1 tablet (1 mg total) by mouth every other day. She takes it 3 days a week 90 tablet 1   aspirin 81 MG EC tablet Take by mouth.     azelastine (ASTELIN) 0.1 % nasal spray Place 1 spray into both nostrils 2 (two) times daily. Use in each nostril as directed     buPROPion (WELLBUTRIN XL) 150 MG 24 hr tablet Take by mouth.     Cholecalciferol (VITAMIN D3) 1000 units CAPS Take 1 capsule by mouth 2 (two)  times daily.      Cholecalciferol 25 MCG (1000 UT) capsule Take by mouth.     cycloSPORINE (RESTASIS) 0.05 % ophthalmic emulsion Apply to eye.     diazepam (VALIUM) 5 MG tablet Take one tablet po one hour prior to procedure. Must have a driver. 1 tablet 0   diclofenac Sodium (VOLTAREN) 1 % GEL diclofenac 1 % topical gel  apply 2 grams to affected area two to three times a day     escitalopram (LEXAPRO) 10 MG tablet Take by mouth.     esomeprazole (NEXIUM) 40 MG capsule Take 40 mg by mouth 2 (two) times daily before a meal.     ezetimibe (ZETIA) 10 MG tablet Take 10 mg by mouth at bedtime.     fluticasone (FLONASE) 50 MCG/ACT nasal spray Place 1 spray into both nostrils daily.     Fluticasone Propionate, Inhal, 50 MCG/ACT AEPB Inhale into the lungs.     levothyroxine (SYNTHROID) 50 MCG tablet Take by mouth.     levothyroxine (SYNTHROID, LEVOTHROID) 50 MCG tablet Take 50 mcg by mouth at bedtime.     linaclotide (LINZESS) 290 MCG CAPS capsule      Magnesium 250 MG TABS      naproxen sodium (ALEVE) 220 MG tablet Take 220 mg by mouth 2 (two) times daily as needed (pain).     ondansetron (ZOFRAN-ODT) 8 MG disintegrating tablet      OVER THE COUNTER MEDICATION Apply 1 application topically daily as needed (pain). Natural pain relief  ointment     Probiotic Product (PROBIOTIC ADVANCED PO) Take 1 tablet by mouth daily.     rosuvastatin (CRESTOR) 5 MG tablet Take 5 mg by mouth at bedtime.      sodium chloride (OCEAN) 0.65 % SOLN nasal spray Place 4 sprays into both nostrils 4 (four) times daily.     SUPER B COMPLEX/C PO Take 1 tablet by mouth daily.     traMADol (ULTRAM) 50 MG tablet      tretinoin (RETIN-A) 0.1 % cream      UNABLE TO FIND Med Name: CBD Cream     Vitamins-Lipotropics (COMPLEX B-100-INOSITOL) TBCR Take 1 tablet by mouth daily.     Vitamins-Lipotropics (COMPLEX B-100-INOSITOL) TBCR Take 1 tablet by mouth daily.     zolpidem (AMBIEN) 10 MG tablet zolpidem 10 mg tablet  take 1 tablet by  mouth at bedtime if needed     No current facility-administered medications for this visit.    PHYSICAL EXAMINATION: ECOG PERFORMANCE STATUS: 1 - Symptomatic but completely ambulatory  Vitals:   09/09/22 1123  BP: 123/79  Pulse: 86  Resp: 16  Temp: 97.7 F (36.5 C)  SpO2: 97%   Filed Weights   09/09/22 1123  Weight: 156 lb 6.4 oz (70.9 kg)      LABORATORY DATA:  I have reviewed the data as listed    Latest Ref Rng & Units 02/03/2022    2:19 PM 09/02/2021   12:31 PM 06/02/2021    3:26 PM  CMP  Glucose 70 - 99 mg/dL 97  110  113   BUN 8 - 23 mg/dL '17  15  14   '$ Creatinine 0.44 - 1.00 mg/dL 0.77  0.76  0.78   Sodium 135 - 145 mmol/L 141  141  141   Potassium 3.5 - 5.1 mmol/L 4.2  4.4  4.0   Chloride 98 - 111 mmol/L 107  107  107   CO2 22 - 32 mmol/L '30  26  25   '$ Calcium 8.9 - 10.3 mg/dL 10.2  10.0  9.6   Total Protein 6.5 - 8.1 g/dL 6.4  6.6  6.5   Total Bilirubin 0.3 - 1.2 mg/dL 0.6  0.6  0.5   Alkaline Phos 38 - 126 U/L 90  101  86   AST 15 - 41 U/L 57  39  39   ALT 0 - 44 U/L 49  31  32     Lab Results  Component Value Date   WBC 7.7 02/03/2022   HGB 12.9 02/03/2022   HCT 39.7 02/03/2022   MCV 91.5 02/03/2022   PLT 202 02/03/2022   NEUTROABS 3.4 02/03/2022    ASSESSMENT & PLAN:  Malignant neoplasm of upper-outer quadrant of left breast in female, estrogen receptor positive ([place]) 03/08/2018: Left Breast:  Grade 1-2 IDC, ER 100% and PR 100%, Ki 67 < 1%, Her 2 Neg Ratio 1.61; Additional Bx: Grade 2 ILC ER/PR Pos Her 2 Neg, Ki 1-5% 04/17/2018: Bil lumpectomies: RT: ALH, Left: Grade 2 ILC, margins cleared with additional surgery Oncotype 14 (ROR 4%) 07/09/2018 - 08/03/2018 : XRT 09/11/2018- June 2020: Tamoxifen (cognitive issues) Genetics Neg 09/26/2019- 11/26/2019: Letrozole )cognitive issues) 12/24/2020- 02/2021: Exemestane discontinued due to AE     Current Treatment: Anastrozole started 07/15/2021 (3 days a week) Anastrozole side effects: 1. Cognitive  dysfn 2. Athralgias Patient does not have to get up and go feeling on her husband is worried about her lack of quality of life.  We  discussed that she should stop the antiestrogen therapy for a couple of months and if she has remarkable improvement in her quality of life then she can stay off of the antiestrogen therapy.  If she does not notice any major changes and she will resume her antiestrogen therapy. BCI: Benefit from extended adj therapy   Breast cancer surveillance: 1.  Breast exam 09/09/2022: Benign 2. bilateral mammogram 01/04/2022: Benign breast density category D 3.  Left breast mammogram and ultrasound 02/10/2022: Benign breast density category D (was done for left axillary firmness and tenderness to palpation)   RTC with a telephone visit in 2 months to discuss restarting antiestrogen therapy versus discontinuing it. There were a trip planned to [place] on a river cruise next year.   No orders of the defined types were placed in this encounter.  The patient has a good understanding of the overall plan. she agrees with it. she will call with any problems that may develop before the next visit here. Total time spent: 30 mins including face to face time and time spent for planning, charting and co-ordination of care   Harriette Ohara, MD 09/09/22    I Gardiner Coins am scribing for Dr. Lindi Adie  I have reviewed the above documentation for accuracy and completeness, and I agree with the above.

## 2022-09-09 NOTE — Assessment & Plan Note (Addendum)
03/08/2018: Left Breast:  Grade 1-2 IDC, ER 100% and PR 100%, Ki 67 < 1%, Her 2 Neg Ratio 1.61; Additional Bx: Grade 2 ILC ER/PR Pos Her 2 Neg, Ki 1-5% 04/17/2018: Bil lumpectomies: RT: ALH, Left: Grade 2 ILC, margins cleared with additional surgery Oncotype 14 (ROR 4%) 07/09/2018 - 08/03/2018: XRT 09/11/2018- June 2020: Tamoxifen (cognitive issues) Genetics Neg 09/26/2019- 11/26/2019: Letrozole )cognitive issues) 12/24/2020- 02/2021: Exemestane discontinued due to AE   Current Treatment: Anastrozole started 07/15/2021 (3 days a week) Anastrozole side effects: 1. Cognitive dysfn 2. Athralgias Patient does not have to get up and go feeling on her husband is worried about her lack of quality of life.  We discussed that she should stop the antiestrogen therapy for a couple of months and if she has remarkable improvement in her quality of life then she can stay off of the antiestrogen therapy.  If she does not notice any major changes and she will resume her antiestrogen therapy. BCI: Benefit from extended adj therapy  Breast cancer surveillance: 1.  Breast exam 09/09/2022: Benign 2. bilateral mammogram 01/04/2022: Benign breast density category D 3.  Left breast mammogram and ultrasound 02/10/2022: Benign breast density category D (was done for left axillary firmness and tenderness to palpation)  RTC with a telephone visit in 2 months to discuss restarting antiestrogen therapy versus discontinuing it. There were a trip planned to [place] on a river cruise next year.

## 2022-09-12 DIAGNOSIS — H40013 Open angle with borderline findings, low risk, bilateral: Secondary | ICD-10-CM | POA: Diagnosis not present

## 2022-09-28 DIAGNOSIS — M5417 Radiculopathy, lumbosacral region: Secondary | ICD-10-CM | POA: Diagnosis not present

## 2022-10-04 DIAGNOSIS — R9089 Other abnormal findings on diagnostic imaging of central nervous system: Secondary | ICD-10-CM | POA: Diagnosis not present

## 2022-10-04 DIAGNOSIS — I6789 Other cerebrovascular disease: Secondary | ICD-10-CM | POA: Diagnosis not present

## 2022-10-10 DIAGNOSIS — T8484XA Pain due to internal orthopedic prosthetic devices, implants and grafts, initial encounter: Secondary | ICD-10-CM | POA: Diagnosis not present

## 2022-10-10 DIAGNOSIS — Z96652 Presence of left artificial knee joint: Secondary | ICD-10-CM | POA: Diagnosis not present

## 2022-10-24 DIAGNOSIS — I671 Cerebral aneurysm, nonruptured: Secondary | ICD-10-CM | POA: Diagnosis not present

## 2022-10-25 DIAGNOSIS — T8484XA Pain due to internal orthopedic prosthetic devices, implants and grafts, initial encounter: Secondary | ICD-10-CM | POA: Diagnosis not present

## 2022-10-25 DIAGNOSIS — Z96652 Presence of left artificial knee joint: Secondary | ICD-10-CM | POA: Diagnosis not present

## 2022-11-04 NOTE — Progress Notes (Addendum)
HEMATOLOGY-ONCOLOGY TELEPHONE VISIT PROGRESS NOTE  I connected with our patient on 11/08/22 at 10:30 AM EST by telephone and verified that I am speaking with the correct person using two identifiers.  I discussed the limitations, risks, security and privacy concerns of performing an evaluation and management service by telephone and the availability of in person appointments.  I also discussed with the patient that there may be a patient responsible charge related to this service. The patient expressed understanding and agreed to proceed.   History of Present Illness: [name] is a 76 y.o with a history of breast cancer currently on anastrozole therapy. She presents to the clinic for via phone follow-up.  She felt a remarkable improvement in her cognitive function and energy levels when she stopped anastrozole therapy.    REVIEW OF SYSTEMS:   Constitutional: Denies fevers, chills or abnormal weight loss All other systems were reviewed with the patient and are negative. Observations/Objective:     Assessment Plan:  Malignant neoplasm of upper-outer quadrant of left breast in female, estrogen receptor positive ([place]) 03/08/2018: Left Breast:  Grade 1-2 IDC, ER 100% and PR 100%, Ki 67 < 1%, Her 2 Neg Ratio 1.61; Additional Bx: Grade 2 ILC ER/PR Pos Her 2 Neg, Ki 1-5% 04/17/2018: Bil lumpectomies: RT: ALH, Left: Grade 2 ILC, margins cleared with additional surgery Oncotype 14 (ROR 4%) 07/09/2018 - 08/03/2018 : XRT 09/11/2018- June 2020: Tamoxifen (cognitive issues) Genetics Neg 09/26/2019- 11/26/2019: Letrozole (cognitive issues) 12/24/2020- 02/2021: Exemestane discontinued due to AE 07/15/2021-07/2022): Anastrozole discontinued due to AE   Current Treatment: Observation  BCI: Benefit from extended adj therapy: But because of adverse effects we are not able to continue it.   Breast cancer surveillance: 1.  Breast exam 09/09/2022: Benign 2. bilateral mammogram 01/04/2022: Benign breast  density category D 3.  Left breast mammogram and ultrasound 02/10/2022: Benign breast density category D (was done for left axillary firmness and tenderness to palpation)  There were a trip planned to [place] on a river cruise next year. Return to clinic on as-needed basis   I discussed the assessment and treatment plan with the patient. The patient was provided an opportunity to ask questions and all were answered. The patient agreed with the plan and demonstrated an understanding of the instructions. The patient was advised to call back or seek an in-person evaluation if the symptoms worsen or if the condition fails to improve as anticipated.   I provided 12 minutes of non-face-to-face time during this encounter.  This includes time for charting and coordination of care   Harriette Ohara, MD  I Gardiner Coins am scribing for Dr. Lindi Adie  I have reviewed the above documentation for accuracy and completeness, and I agree with the above.

## 2022-11-07 DIAGNOSIS — E039 Hypothyroidism, unspecified: Secondary | ICD-10-CM | POA: Diagnosis not present

## 2022-11-07 DIAGNOSIS — R739 Hyperglycemia, unspecified: Secondary | ICD-10-CM | POA: Diagnosis not present

## 2022-11-07 DIAGNOSIS — Z1212 Encounter for screening for malignant neoplasm of rectum: Secondary | ICD-10-CM | POA: Diagnosis not present

## 2022-11-07 DIAGNOSIS — M8580 Other specified disorders of bone density and structure, unspecified site: Secondary | ICD-10-CM | POA: Diagnosis not present

## 2022-11-07 DIAGNOSIS — E785 Hyperlipidemia, unspecified: Secondary | ICD-10-CM | POA: Diagnosis not present

## 2022-11-07 DIAGNOSIS — R7989 Other specified abnormal findings of blood chemistry: Secondary | ICD-10-CM | POA: Diagnosis not present

## 2022-11-08 ENCOUNTER — Inpatient Hospital Stay: Payer: Medicare Other | Attending: Hematology and Oncology | Admitting: Hematology and Oncology

## 2022-11-08 DIAGNOSIS — C50412 Malignant neoplasm of upper-outer quadrant of left female breast: Secondary | ICD-10-CM | POA: Insufficient documentation

## 2022-11-08 DIAGNOSIS — Z79811 Long term (current) use of aromatase inhibitors: Secondary | ICD-10-CM | POA: Diagnosis not present

## 2022-11-08 DIAGNOSIS — Z170 Estrogen receptor positive status [ER+]: Secondary | ICD-10-CM | POA: Diagnosis not present

## 2022-11-08 NOTE — Assessment & Plan Note (Signed)
03/08/2018: Left Breast:  Grade 1-2 IDC, ER 100% and PR 100%, Ki 67 < 1%, Her 2 Neg Ratio 1.61; Additional Bx: Grade 2 ILC ER/PR Pos Her 2 Neg, Ki 1-5% 04/17/2018: Bil lumpectomies: RT: ALH, Left: Grade 2 ILC, margins cleared with additional surgery Oncotype 14 (ROR 4%) 07/09/2018 - 08/03/2018 : XRT 09/11/2018- June 2020: Tamoxifen (cognitive issues) Genetics Neg 09/26/2019- 11/26/2019: Letrozole )cognitive issues) 12/24/2020- 02/2021: Exemestane discontinued due to AE     Current Treatment: Anastrozole started 07/15/2021 (3 days a week) Anastrozole side effects: 1. Cognitive dysfn 2. Athralgias Patient does not have to get up and go feeling on her husband is worried about her lack of quality of life.  BCI: Benefit from extended adj therapy   Breast cancer surveillance: 1.  Breast exam 09/09/2022: Benign 2. bilateral mammogram 01/04/2022: Benign breast density category D 3.  Left breast mammogram and ultrasound 02/10/2022: Benign breast density category D (was done for left axillary firmness and tenderness to palpation)   RTC with a telephone visit in 2 months to discuss restarting antiestrogen therapy versus discontinuing it. There were a trip planned to [place] on a river cruise next year.

## 2022-11-09 ENCOUNTER — Telehealth: Payer: Self-pay | Admitting: Hematology and Oncology

## 2022-11-09 NOTE — Telephone Encounter (Signed)
Scheduled appointment per 11/28 los. Patient is aware. 

## 2022-11-10 DIAGNOSIS — G894 Chronic pain syndrome: Secondary | ICD-10-CM | POA: Diagnosis not present

## 2022-11-10 DIAGNOSIS — M1712 Unilateral primary osteoarthritis, left knee: Secondary | ICD-10-CM | POA: Diagnosis not present

## 2022-11-10 DIAGNOSIS — M5417 Radiculopathy, lumbosacral region: Secondary | ICD-10-CM | POA: Diagnosis not present

## 2022-11-14 DIAGNOSIS — R42 Dizziness and giddiness: Secondary | ICD-10-CM | POA: Diagnosis not present

## 2022-11-14 DIAGNOSIS — R82998 Other abnormal findings in urine: Secondary | ICD-10-CM | POA: Diagnosis not present

## 2022-11-14 DIAGNOSIS — K760 Fatty (change of) liver, not elsewhere classified: Secondary | ICD-10-CM | POA: Diagnosis not present

## 2022-11-14 DIAGNOSIS — E039 Hypothyroidism, unspecified: Secondary | ICD-10-CM | POA: Diagnosis not present

## 2022-11-14 DIAGNOSIS — E785 Hyperlipidemia, unspecified: Secondary | ICD-10-CM | POA: Diagnosis not present

## 2022-11-14 DIAGNOSIS — Z1331 Encounter for screening for depression: Secondary | ICD-10-CM | POA: Diagnosis not present

## 2022-11-14 DIAGNOSIS — I700 Atherosclerosis of aorta: Secondary | ICD-10-CM | POA: Diagnosis not present

## 2022-11-14 DIAGNOSIS — K219 Gastro-esophageal reflux disease without esophagitis: Secondary | ICD-10-CM | POA: Diagnosis not present

## 2022-11-14 DIAGNOSIS — Z0000 Encounter for general adult medical examination without abnormal findings: Secondary | ICD-10-CM | POA: Diagnosis not present

## 2022-11-14 DIAGNOSIS — F329 Major depressive disorder, single episode, unspecified: Secondary | ICD-10-CM | POA: Diagnosis not present

## 2022-11-14 DIAGNOSIS — G4700 Insomnia, unspecified: Secondary | ICD-10-CM | POA: Diagnosis not present

## 2022-11-14 DIAGNOSIS — M8580 Other specified disorders of bone density and structure, unspecified site: Secondary | ICD-10-CM | POA: Diagnosis not present

## 2022-11-14 DIAGNOSIS — R413 Other amnesia: Secondary | ICD-10-CM | POA: Diagnosis not present

## 2023-01-06 ENCOUNTER — Ambulatory Visit
Admission: RE | Admit: 2023-01-06 | Discharge: 2023-01-06 | Disposition: A | Discharge status: Home / Self Care | Payer: Medicare Other | Source: Ambulatory Visit | Attending: Hematology and Oncology | Admitting: Hematology and Oncology

## 2023-01-06 DIAGNOSIS — N644 Mastodynia: Secondary | ICD-10-CM | POA: Diagnosis not present

## 2023-01-06 DIAGNOSIS — C50412 Malignant neoplasm of upper-outer quadrant of left female breast: Secondary | ICD-10-CM

## 2023-02-09 DIAGNOSIS — I729 Aneurysm of unspecified site: Secondary | ICD-10-CM | POA: Diagnosis not present

## 2023-02-15 DIAGNOSIS — M5417 Radiculopathy, lumbosacral region: Secondary | ICD-10-CM | POA: Diagnosis not present

## 2023-02-21 DIAGNOSIS — M8589 Other specified disorders of bone density and structure, multiple sites: Secondary | ICD-10-CM | POA: Diagnosis not present

## 2023-03-03 DIAGNOSIS — C50412 Malignant neoplasm of upper-outer quadrant of left female breast: Secondary | ICD-10-CM | POA: Diagnosis not present

## 2023-03-03 DIAGNOSIS — Z170 Estrogen receptor positive status [ER+]: Secondary | ICD-10-CM | POA: Diagnosis not present

## 2023-03-13 NOTE — Progress Notes (Unsigned)
Cardiology Clinic Note   Patient Name: RUMOR SIRMAN Date of Encounter: 03/15/2023  Primary Care Provider:  Shon Baton, MD Primary Cardiologist:  Kirk Ruths, MD  Patient Profile    [name] 77 year old female presents to the clinic today for follow-up evaluation of her hyperlipidemia and dizziness.  Past Medical History    Past Medical History:  Diagnosis Date   Arthritis    hands and knees   Complication of anesthesia    aspiration pna; following a colonoscopy, pt requests head of bed elevated if possible.   Constipation    Cough    Depression    Dysrhythmia    RBBB on 06-06-17 ekg    Elevated liver function tests    Esophageal stricture    Family history of breast cancer    Gallstones    Genetic testing 04/02/2018   STAT Breast panel with reflex to Multi-Cancer panel (83 genes) @ [facility] - No pathogenic mutations detected   GERD (gastroesophageal reflux disease)    Hiatal hernia    History of kidney stones    History of radiation therapy 07/09/2018- 08/03/18   Left Breast/ 40.05 Gy in 15 fractions. Left Breast boost 10 Gy in 5 fractions.    Hyperlipemia    Hypothyroidism    Insomnia    Malignant neoplasm of upper-outer quadrant of left female breast    Nephrolithiasis    Personal history of radiation therapy    Pneumonia 2015   aspirated after colonosopy   Renal cyst    Past Surgical History:  Procedure Laterality Date   BREAST BIOPSY Left 05/31/2019   mri   BREAST BIOPSY Left 04/2019   malignant   BREAST LUMPECTOMY Left 04/17/2018   malignant   BREAST LUMPECTOMY WITH RADIOACTIVE SEED AND SENTINEL LYMPH NODE BIOPSY Left 04/17/2018   Procedure: LEFT BREAST LUMPECTOMY WITH BRACKETED RADIOACTIVE SEEDS AND SENTINEL LYMPH NODE BIOPSY;  Surgeon: Rolm Bookbinder, MD;  Location: [facility];  Service: General;  Laterality: Left;   BREAST SURGERY Left    Lumpectomy   CARDIOVASCULAR STRESS TEST  07/19/2006   EF 70%, NO EVIDENCE OF  ISCHEMIA   CHOLECYSTECTOMY  12/30/10   COLONOSCOPY     DILATION AND CURETTAGE OF UTERUS     HYSTEROSCOPY WITH D & C N/A 06/07/2018   Procedure: DILATATION AND CURETTAGE /HYSTEROSCOPY;  Surgeon: [name], MD;  Location: [facility];  Service: Gynecology;  Laterality: N/A;   inguinal herniography     NASAL SINUS SURGERY     RADIOACTIVE SEED GUIDED EXCISIONAL BREAST BIOPSY Right 04/17/2018   Procedure: RIGHT BREAST SEED GUIDED EXCISIONAL BIOPSY;  Surgeon: Rolm Bookbinder, MD;  Location: [facility];  Service: General;  Laterality: Right;   RE-EXCISION OF BREAST LUMPECTOMY Left 05/15/2018   Procedure: RE-EXCISION OF LEFT BREAST LUMPECTOMY, ASPIRATION OF LEFT AXILLARY SEROMA;  Surgeon: Rolm Bookbinder, MD;  Location: [facility];  Service: General;  Laterality: Left;   T & A     TOOTH EXTRACTION     TOTAL KNEE ARTHROPLASTY Left 09/11/2017   Procedure: LEFT TOTAL KNEE ARTHROPLASTY;  Surgeon: Gaynelle Arabian, MD;  Location: [facility];  Service: Orthopedics;  Laterality: Left;  with block   US ECHOCARDIOGRAPHY  07/17/2006   EF 55-60%    Allergies  Allergies  Allergen Reactions   Oxycontin [Oxycodone Hcl]     "it made me feel very high and hyper"    Codeine Nausea And Vomiting    History of Present  Illness    LOLENE SHAMS has a PMH of chest pain, nonspecific EKG changes, dizziness, breast CA, and hyperlipidemia.  Her echocardiogram 12/17 showed normal LV function and G1 DD.  She was noted to have orthostatic symptoms during previous cardiology visits.  Her nuclear stress test 6/18 showed ejection fraction of 64% and no ischemia.  Her carotid Doppler 7/18 showed 1-39% left carotid stenosis.  She was seen by Dr. Stanford Breed 08/04/2020.  During that time she denied dyspnea, chest pain, palpitations, and syncope.  She did note occasional dizziness with standing.   She presented to the clinic 04/04/22 for follow-up evaluation and stated she was limited in her mobility and  has not been as physically active due to sciatica in her right leg.  She continued to have cancer treatment.  She had recently started taking her anastrozole 3 days/week instead of 7.  She felt that her memory had improved.  She denied symptoms of heart failure.  We reviewed her previous stress testing.  Both she and her husband expressed understanding.  She did have an episode of global amnesia in November last year.  The episode was at [facility].  We reviewed her CT report.  I asked her to increase her physical activity as tolerated, continue her current medication regimen, and continue heart healthy low-sodium diet.  Follow-up for 12 months was planned.  She presents with clinic today for follow-up evaluation and states she had a back injection for sciatica approximately 1 month ago.  Since that time she has noticed elevated heart rate.  She reports that when she was younger, in her 29s, she was on medication for tachycardia.  He her EKG today shows normal sinus rhythm 94 bpm.  We reviewed options for treatment.  I have asked her to maintain her p.o. hydration, avoid caffeine, avoid too much chocolate and other triggers for palpitations.  She reports that she has stopped anastrozole and has had some improvement in her memory.  I will prescribe propranolol 10 mg twice daily x 1 month and have her follow-up in 1 month.   Today she denies chest pain, shortness of breath, lower extremity edema, fatigue, palpitations, melena, hematuria, hemoptysis, diaphoresis, weakness, presyncope, syncope, orthopnea, and PND.    Home Medications    Prior to Admission medications   Medication Sig Start Date End Date Taking? Authorizing Provider  anastrozole (ARIMIDEX) 1 MG tablet Take 1 mg by mouth daily. She takes it 3 days a week    [provider]  aspirin 81 MG EC tablet Take by mouth.    [provider]  azelastine (ASTELIN) 0.1 % nasal spray Place 1 spray into both nostrils 2 (two) times daily. Use  in each nostril as directed    [provider]  buPROPion (WELLBUTRIN XL) 150 MG 24 hr tablet Take by mouth.    [provider]  Cholecalciferol (VITAMIN D3) 1000 units CAPS Take 1 capsule by mouth 2 (two) times daily.     [provider]  cycloSPORINE (RESTASIS) 0.05 % ophthalmic emulsion Apply to eye.    [provider]  cycloSPORINE, PF, (CEQUA) 0.09 % SOLN  08/26/20   [provider]  diazepam (VALIUM) 5 MG tablet Take one tablet po one hour prior to procedure. Must have a driver. 12/15/21   Kirsteins, Luanna Salk, MD  diclofenac Sodium (VOLTAREN) 1 % GEL diclofenac 1 % topical gel  apply 2 grams to affected area two to three times a day    [provider]  escitalopram (LEXAPRO) 10 MG tablet Take by mouth.    [provider]  esomeprazole (NEXIUM) 40 MG capsule Take 40 mg by mouth 2 (two) times daily before a meal.    [provider]  ezetimibe (ZETIA) 10 MG tablet Take 10 mg by mouth at bedtime.    [provider]  fluticasone (FLONASE) 50 MCG/ACT nasal spray Place 1 spray into both nostrils daily.    [provider]  Fluticasone Propionate, Inhal, 50 MCG/ACT AEPB Inhale into the lungs.    [provider]  levothyroxine (SYNTHROID, LEVOTHROID) 50 MCG tablet Take 50 mcg by mouth at bedtime.    [provider]  linaclotide Rolan Lipa) 290 MCG CAPS capsule  11/24/20   [provider]  Magnesium 250 MG TABS     [provider]  naproxen sodium (ALEVE) 220 MG tablet Take 220 mg by mouth 2 (two) times daily as needed (pain).    [provider]  ondansetron (ZOFRAN-ODT) 8 MG disintegrating tablet  01/25/21   [provider]  OVER THE COUNTER MEDICATION Apply 1 application topically daily as needed (pain). Natural pain relief ointment    [provider]  predniSONE (STERAPRED UNI-PAK 21 TAB) 10 MG (21) TBPK tablet  07/21/20   [provider]   Probiotic Product (PROBIOTIC ADVANCED PO) Take 1 tablet by mouth daily.    [provider]  rosuvastatin (CRESTOR) 5 MG tablet Take 5 mg by mouth at bedtime.     [provider]  sodium chloride (OCEAN) 0.65 % SOLN nasal spray Place 4 sprays into both nostrils 4 (four) times daily.    [provider]  SUPER B COMPLEX/C PO Take 1 tablet by mouth daily.    [provider]  traMADol Veatrice Bourbon) 50 MG tablet  01/25/21   [provider]  tretinoin (RETIN-A) 0.1 % cream  03/02/21   [provider]  UNABLE TO FIND Med Name: CBD Cream    [provider]  zolpidem (AMBIEN) 10 MG tablet zolpidem 10 mg tablet  take 1 tablet by mouth at bedtime if needed    [provider]    Family History    Family History  Problem Relation Age of Onset   Breast cancer Mother 59       deceased 66; TAH/BSO in 51s/50s   Dementia Father        deceased 37   Breast cancer Sister    Breast cancer Maternal Aunt 82       deceased 62s   Colon cancer Neg Hx    Stomach cancer Neg Hx    She indicated that her mother is deceased. She indicated that her father is deceased. She indicated that her sister is alive. She indicated that her brother is alive. She indicated that her maternal grandmother is deceased. She indicated that her maternal grandfather is deceased. She indicated that her paternal grandmother is deceased. She indicated that her paternal grandfather is deceased. She indicated that her maternal aunt is deceased. She indicated that the status of her neg hx is unknown.  Social History    Social History   Socioeconomic History   Marital status: Married    Spouse name: Not on file   Number of children: 2   Years of education: Not on file   Highest education level: Not on file  Occupational History   Occupation: Retired    Fish farm manager: [employer]  Tobacco Use   Smoking status: Never  Smokeless tobacco: Never  Vaping Use   Vaping Use: Never  used  Substance and Sexual Activity   Alcohol use: No   Drug use: No   Sexual activity: Not on file  Other Topics Concern   Not on file  Social History Narrative   Not on file   Social Determinants of Health   Financial Resource Strain: Not on file  Food Insecurity: Not on file  Transportation Needs: No Transportation Needs (09/07/2018)   PRAPARE - Hydrologist (Medical): No    Lack of Transportation (Non-Medical): No  Physical Activity: Not on file  Stress: Not on file  Social Connections: Not on file  Intimate Partner Violence: Not At Risk (09/07/2018)   Humiliation, Afraid, Rape, and Kick questionnaire    Fear of Current or Ex-Partner: No    Emotionally Abused: No    Physically Abused: No    Sexually Abused: No     Review of Systems    General:  No chills, fever, night sweats or weight changes.  Cardiovascular:  No chest pain, dyspnea on exertion, edema, orthopnea, palpitations, paroxysmal nocturnal dyspnea. Dermatological: No rash, lesions/masses Respiratory: No cough, dyspnea Urologic: No hematuria, dysuria Abdominal:   No nausea, vomiting, diarrhea, bright red blood per rectum, melena, or hematemesis Neurologic:  No visual changes, wkns, changes in mental status. All other systems reviewed and are otherwise negative except as noted above.  Physical Exam    VS:  BP 122/72 (BP Location: Right Arm, Patient Position: Sitting, Cuff Size: Normal)   Pulse 94   Ht 5\' 8"  (1.727 m)   Wt 152 lb 6.4 oz (69.1 kg)   SpO2 95%   BMI 23.17 kg/m  , BMI Body mass index is 23.17 kg/m. GEN: Well nourished, well developed, in no acute distress. HEENT: normal. Neck: Supple, no JVD, carotid bruits, or masses. Cardiac: RRR, no murmurs, rubs, or gallops. No clubbing, cyanosis, edema.  Radials/DP/PT 2+ and equal bilaterally.  Respiratory:  Respirations regular and unlabored, clear to auscultation bilaterally. GI: Soft, nontender, nondistended, BS + x  4. MS: no deformity or atrophy. Skin: warm and dry, no rash. Neuro:  Strength and sensation are intact. Psych: Normal affect.  Accessory Clinical Findings    Recent Labs: No results found for requested labs within last 365 days.   Recent Lipid Panel    Component Value Date/Time   CHOL 133 08/08/2011 0913   TRIG 151.0 (H) 08/08/2011 0913   HDL 43.10 08/08/2011 0913   CHOLHDL 3 08/08/2011 0913   VLDL 30.2 08/08/2011 0913   LDLCALC 60 08/08/2011 0913    ECG personally reviewed by me today-normal sinus rhythm possible right ventricular hypertrophy 94 bpm  EKG 04/04/2022 normal sinus rhythm incomplete right bundle branch block possible right ventricular hypertrophy 74 bpm- No acute changes  EKG 08/04/2020 Sinus rhythm 100 bpm, incomplete RBBB, inferior lateral infarct, no acute changes from 08/2019  Assessment & Plan   1.  Elevated heart rate-EKG today shows heart rate of 94.  Normal sinus rhythm possible right ventricular hypertrophy.  Reports having a back injection for sciatica approximately 1 month ago.  Since that time she has noted elevated heart rate. Avoid triggers caffeine, chocolate, EtOH, dehydration etc. Start propranolol 10 mg twice daily  History of chest discomfort-no chest pain today.  No recent episodes of arm neck back or chest discomfort.   Previously underwent stress test which was negative and showed normal LVEF. Continue aspirin No plans for ischemic evaluation  at this time.  Orthostasis-notes only occasional brief episodes with standing quickly.   Maintain p.o. hydration Maintain sodium in diet Change positions slowly   Hyperlipidemia-LDL 74 on 11/07/2022 Continue aspirin, rosuvastatin Heart healthy low-sodium high-fiber diet Increase physical activity as tolerated Follows with PCP-request labs from PCP.   Disposition: Follow-up with Dr. Stanford Breed or me in 1-2 months and as needed.  Jossie Ng. Jrake Rodriquez NP-C    03/15/2023, 12:04 PM [place] [address] Office [phone] Fax [phone]  Notice: This dictation was prepared with Dragon dictation along with smaller phrase technology. Any transcriptional errors that result from this process are unintentional and may not be corrected upon review.  I spent 13 minutes examining this patient, reviewing medications, and using patient centered shared decision making involving her cardiac care.  Prior to her visit I spent greater than 20 minutes reviewing her past medical history,  medications, and prior cardiac tests.

## 2023-03-15 ENCOUNTER — Ambulatory Visit: Payer: Medicare Other | Attending: General Practice | Admitting: General Practice

## 2023-03-15 ENCOUNTER — Encounter: Payer: Self-pay | Admitting: General Practice

## 2023-03-15 ENCOUNTER — Other Ambulatory Visit: Payer: Self-pay | Admitting: General Practice

## 2023-03-15 VITALS — BP 122/72 | HR 94 | Ht 68.0 in | Wt 152.4 lb

## 2023-03-15 DIAGNOSIS — E7800 Pure hypercholesterolemia, unspecified: Secondary | ICD-10-CM | POA: Diagnosis not present

## 2023-03-15 DIAGNOSIS — I951 Orthostatic hypotension: Secondary | ICD-10-CM

## 2023-03-15 DIAGNOSIS — R030 Elevated blood-pressure reading, without diagnosis of hypertension: Secondary | ICD-10-CM

## 2023-03-15 DIAGNOSIS — R0789 Other chest pain: Secondary | ICD-10-CM

## 2023-03-15 DIAGNOSIS — R009 Unspecified abnormalities of heart beat: Secondary | ICD-10-CM

## 2023-03-15 DIAGNOSIS — R42 Dizziness and giddiness: Secondary | ICD-10-CM | POA: Diagnosis not present

## 2023-03-15 MED ORDER — PROPRANOLOL HCL 10 MG PO TABS
10.0000 mg | ORAL_TABLET | Freq: Two times a day (BID) | ORAL | 0 refills | Status: DC
Start: 2023-03-15 — End: 2023-03-15

## 2023-03-15 NOTE — Patient Instructions (Signed)
Medication Instructions:  TAKE PROPRANOLOL 10MG  TWICE DAILY FOR 30 DAYS *If you need a refill on your cardiac medications before your next appointment, please call your pharmacy*  Lab Work: NONE If you have labs (blood work) drawn today and your tests are completely normal, you will receive your results only by:  [facility] (if you have MyChart) OR A paper copy in the mail If you have any lab test that is abnormal or we need to change your treatment, we will call you to review the results.  Other Instructions MAINTAIN HYDRATION  Please try to avoid these triggers: Do not use any products that have nicotine or tobacco in them. These include cigarettes, e-cigarettes, and chewing tobacco. If you need help quitting, ask your doctor. Eat heart-healthy foods. Talk with your doctor about the right eating plan for you. Stay hydrated Do not drink alcohol, Caffeine or chocolate. Lose weight if you are overweight.  Follow-Up: At [facility], you and your health needs are our priority.  As part of our continuing mission to provide you with exceptional heart care, we have created designated Provider Care Teams.  These Care Teams include your primary Cardiologist (physician) and Advanced Practice Providers (APPs -  Physician Assistants and Nurse Practitioners) who all work together to provide you with the care you need, when you need it.  Your next appointment:   1-2 month(s)  Provider:   Coletta Memos, FNP

## 2023-03-22 DIAGNOSIS — L814 Other melanin hyperpigmentation: Secondary | ICD-10-CM | POA: Diagnosis not present

## 2023-03-22 DIAGNOSIS — L820 Inflamed seborrheic keratosis: Secondary | ICD-10-CM | POA: Diagnosis not present

## 2023-03-22 DIAGNOSIS — D0461 Carcinoma in situ of skin of right upper limb, including shoulder: Secondary | ICD-10-CM | POA: Diagnosis not present

## 2023-03-22 DIAGNOSIS — L821 Other seborrheic keratosis: Secondary | ICD-10-CM | POA: Diagnosis not present

## 2023-03-23 DIAGNOSIS — M5386 Other specified dorsopathies, lumbar region: Secondary | ICD-10-CM | POA: Diagnosis not present

## 2023-03-23 DIAGNOSIS — G894 Chronic pain syndrome: Secondary | ICD-10-CM | POA: Diagnosis not present

## 2023-03-23 DIAGNOSIS — M5417 Radiculopathy, lumbosacral region: Secondary | ICD-10-CM | POA: Diagnosis not present

## 2023-03-23 DIAGNOSIS — M1712 Unilateral primary osteoarthritis, left knee: Secondary | ICD-10-CM | POA: Diagnosis not present

## 2023-04-10 DIAGNOSIS — M4727 Other spondylosis with radiculopathy, lumbosacral region: Secondary | ICD-10-CM | POA: Diagnosis not present

## 2023-04-10 DIAGNOSIS — M48061 Spinal stenosis, lumbar region without neurogenic claudication: Secondary | ICD-10-CM | POA: Diagnosis not present

## 2023-04-10 DIAGNOSIS — M4726 Other spondylosis with radiculopathy, lumbar region: Secondary | ICD-10-CM | POA: Diagnosis not present

## 2023-04-10 DIAGNOSIS — M4316 Spondylolisthesis, lumbar region: Secondary | ICD-10-CM | POA: Diagnosis not present

## 2023-04-19 ENCOUNTER — Ambulatory Visit: Payer: Medicare Other | Admitting: General Practice

## 2023-04-25 ENCOUNTER — Ambulatory Visit: Payer: Medicare Other | Admitting: General Practice

## 2023-05-10 DIAGNOSIS — T8484XA Pain due to internal orthopedic prosthetic devices, implants and grafts, initial encounter: Secondary | ICD-10-CM | POA: Diagnosis not present

## 2023-05-10 DIAGNOSIS — Z471 Aftercare following joint replacement surgery: Secondary | ICD-10-CM | POA: Diagnosis not present

## 2023-05-10 DIAGNOSIS — Z96652 Presence of left artificial knee joint: Secondary | ICD-10-CM | POA: Diagnosis not present

## 2023-05-19 DIAGNOSIS — G4700 Insomnia, unspecified: Secondary | ICD-10-CM | POA: Diagnosis not present

## 2023-05-19 DIAGNOSIS — C44211 Basal cell carcinoma of skin of unspecified ear and external auricular canal: Secondary | ICD-10-CM | POA: Diagnosis not present

## 2023-05-19 DIAGNOSIS — E039 Hypothyroidism, unspecified: Secondary | ICD-10-CM | POA: Diagnosis not present

## 2023-05-19 DIAGNOSIS — I725 Aneurysm of other precerebral arteries: Secondary | ICD-10-CM | POA: Diagnosis not present

## 2023-05-19 DIAGNOSIS — F329 Major depressive disorder, single episode, unspecified: Secondary | ICD-10-CM | POA: Diagnosis not present

## 2023-05-19 DIAGNOSIS — E785 Hyperlipidemia, unspecified: Secondary | ICD-10-CM | POA: Diagnosis not present

## 2023-05-19 DIAGNOSIS — M1990 Unspecified osteoarthritis, unspecified site: Secondary | ICD-10-CM | POA: Diagnosis not present

## 2023-05-19 DIAGNOSIS — R413 Other amnesia: Secondary | ICD-10-CM | POA: Diagnosis not present

## 2023-05-19 DIAGNOSIS — I700 Atherosclerosis of aorta: Secondary | ICD-10-CM | POA: Diagnosis not present

## 2023-05-19 DIAGNOSIS — R42 Dizziness and giddiness: Secondary | ICD-10-CM | POA: Diagnosis not present

## 2023-05-19 DIAGNOSIS — K219 Gastro-esophageal reflux disease without esophagitis: Secondary | ICD-10-CM | POA: Diagnosis not present

## 2023-05-19 DIAGNOSIS — M8580 Other specified disorders of bone density and structure, unspecified site: Secondary | ICD-10-CM | POA: Diagnosis not present

## 2023-05-22 DIAGNOSIS — M4186 Other forms of scoliosis, lumbar region: Secondary | ICD-10-CM | POA: Diagnosis not present

## 2023-05-22 DIAGNOSIS — M4316 Spondylolisthesis, lumbar region: Secondary | ICD-10-CM | POA: Diagnosis not present

## 2023-05-22 DIAGNOSIS — M5136 Other intervertebral disc degeneration, lumbar region: Secondary | ICD-10-CM | POA: Diagnosis not present

## 2023-05-22 DIAGNOSIS — M48061 Spinal stenosis, lumbar region without neurogenic claudication: Secondary | ICD-10-CM | POA: Diagnosis not present

## 2023-05-22 DIAGNOSIS — M5416 Radiculopathy, lumbar region: Secondary | ICD-10-CM | POA: Diagnosis not present

## 2023-05-22 DIAGNOSIS — M419 Scoliosis, unspecified: Secondary | ICD-10-CM | POA: Diagnosis not present

## 2023-05-22 DIAGNOSIS — Z1330 Encounter for screening examination for mental health and behavioral disorders, unspecified: Secondary | ICD-10-CM | POA: Diagnosis not present

## 2023-05-22 DIAGNOSIS — M47816 Spondylosis without myelopathy or radiculopathy, lumbar region: Secondary | ICD-10-CM | POA: Diagnosis not present

## 2023-05-30 DIAGNOSIS — Z96652 Presence of left artificial knee joint: Secondary | ICD-10-CM | POA: Diagnosis not present

## 2023-05-30 DIAGNOSIS — G894 Chronic pain syndrome: Secondary | ICD-10-CM | POA: Diagnosis not present

## 2023-05-30 DIAGNOSIS — M1712 Unilateral primary osteoarthritis, left knee: Secondary | ICD-10-CM | POA: Diagnosis not present

## 2023-06-12 NOTE — Progress Notes (Unsigned)
Cardiology Clinic Note   Patient Name: [name] Date of Encounter: 06/14/2023  Primary Care Provider:  Creola Corn, MD Primary Cardiologist:  Olga Millers, MD  Patient Profile    [name] 77 year old female presents to the clinic today for follow-up evaluation of her hyperlipidemia and dizziness.  Past Medical History    Past Medical History:  Diagnosis Date   Arthritis    hands and knees   Complication of anesthesia    aspiration pna; following a colonoscopy, pt requests head of bed elevated if possible.   Constipation    Cough    Depression    Dysrhythmia    RBBB on 06-06-17 ekg    Elevated liver function tests    Esophageal stricture    Family history of breast cancer    Gallstones    Genetic testing 04/02/2018   STAT Breast panel with reflex to Multi-Cancer panel (83 genes) @ [facility] - No pathogenic mutations detected   GERD (gastroesophageal reflux disease)    Hiatal hernia    History of kidney stones    History of radiation therapy 07/09/2018- 08/03/18   Left Breast/ 40.05 Gy in 15 fractions. Left Breast boost 10 Gy in 5 fractions.    Hyperlipemia    Hypothyroidism    Insomnia    Malignant neoplasm of upper-outer quadrant of left female breast (HCC)    Nephrolithiasis    Personal history of radiation therapy    Pneumonia 2015   aspirated after colonosopy   Renal cyst    Past Surgical History:  Procedure Laterality Date   BREAST BIOPSY Left 05/31/2019   mri   BREAST BIOPSY Left 04/2019   malignant   BREAST LUMPECTOMY Left 04/17/2018   malignant   BREAST LUMPECTOMY WITH RADIOACTIVE SEED AND SENTINEL LYMPH NODE BIOPSY Left 04/17/2018   Procedure: LEFT BREAST LUMPECTOMY WITH BRACKETED RADIOACTIVE SEEDS AND SENTINEL LYMPH NODE BIOPSY;  Surgeon: Emelia Loron, MD;  Location: [facility];  Service: General;  Laterality: Left;   BREAST SURGERY Left    Lumpectomy   CARDIOVASCULAR STRESS TEST  07/19/2006   EF 70%, NO EVIDENCE OF  ISCHEMIA   CHOLECYSTECTOMY  12/30/10   COLONOSCOPY     DILATION AND CURETTAGE OF UTERUS     HYSTEROSCOPY WITH D & C N/A 06/07/2018   Procedure: DILATATION AND CURETTAGE /HYSTEROSCOPY;  Surgeon: Marcelle Overlie, MD;  Location: [facility];  Service: Gynecology;  Laterality: N/A;   inguinal herniography     NASAL SINUS SURGERY     RADIOACTIVE SEED GUIDED EXCISIONAL BREAST BIOPSY Right 04/17/2018   Procedure: RIGHT BREAST SEED GUIDED EXCISIONAL BIOPSY;  Surgeon: Emelia Loron, MD;  Location: [facility];  Service: General;  Laterality: Right;   RE-EXCISION OF BREAST LUMPECTOMY Left 05/15/2018   Procedure: RE-EXCISION OF LEFT BREAST LUMPECTOMY, ASPIRATION OF LEFT AXILLARY SEROMA;  Surgeon: Emelia Loron, MD;  Location: [facility];  Service: General;  Laterality: Left;   T & A     TOOTH EXTRACTION     TOTAL KNEE ARTHROPLASTY Left 09/11/2017   Procedure: LEFT TOTAL KNEE ARTHROPLASTY;  Surgeon: Ollen Gross, MD;  Location: [facility];  Service: Orthopedics;  Laterality: Left;  with block   US ECHOCARDIOGRAPHY  07/17/2006   EF 55-60%    Allergies  Allergies  Allergen Reactions   Oxycontin [Oxycodone Hcl]     "it made me feel very high and hyper"    Codeine Nausea And Vomiting    History of  Present Illness    [name] has a PMH of chest pain, nonspecific EKG changes, dizziness, breast CA, and hyperlipidemia.  Her echocardiogram 12/17 showed normal LV function and G1 DD.  She was noted to have orthostatic symptoms during previous cardiology visits.  Her nuclear stress test 6/18 showed ejection fraction of 64% and no ischemia.  Her carotid Doppler 7/18 showed 1-39% left carotid stenosis.  She was seen by Dr. Jens Som 08/04/2020.  During that time she denied dyspnea, chest pain, palpitations, and syncope.  She did note occasional dizziness with standing.   She presented to the clinic 04/04/22 for follow-up evaluation and stated she was limited in her mobility and  has not been as physically active due to sciatica in her right leg.  She continued to have cancer treatment.  She had recently started taking her anastrozole 3 days/week instead of 7.  She felt that her memory had improved.  She denied symptoms of heart failure.  We reviewed her previous stress testing.  Both she and her husband expressed understanding.  She did have an episode of global amnesia in November last year.  The episode was at [facility].  We reviewed her CT report.  I asked her to increase her physical activity as tolerated, continue her current medication regimen, and continue heart healthy low-sodium diet.  Follow-up for 12 months was planned.  She presented with clinic  03/15/23 for follow-up evaluation and stated she had a back injection for sciatica approximately 1 month prior.  Since that time she had noticed elevated heart rate.  She reported that when she was younger, in her 69s, she was on medication for tachycardia.   EKG  showed normal sinus rhythm 94 bpm.  We reviewed options for treatment.  I  asked her to maintain her p.o. hydration, avoid caffeine, avoid too much chocolate and other triggers for palpitations.  She reported that she had stopped anastrozole and  had some improvement in her memory.  I  prescribed propranolol 10 mg twice daily x 1 month and planned follow-up in 1 month.  She presents to the clinic today for follow-up evaluation and states she continues to have pain related to her sciatica.  She notices this on her left side.  Her blood pressure is slightly elevated today at 142/80.  She reports she is getting ready to see a neurosurgeon to talk about options for pain control.  She has been drinking about 36 to 46 ounces daily.  She reports that she feels better with her propranolol.  We will refill this for 90 days.  She does take Advil daily for her sciatica pain.  We will plan follow-up in 9 months.   Today she denies chest pain, shortness of breath, lower extremity edema,  fatigue, palpitations, melena, hematuria, hemoptysis, diaphoresis, weakness, presyncope, syncope, orthopnea, and PND.    Home Medications    Prior to Admission medications   Medication Sig Start Date End Date Taking? Authorizing Provider  anastrozole (ARIMIDEX) 1 MG tablet Take 1 mg by mouth daily. She takes it 3 days a week    [provider]  aspirin 81 MG EC tablet Take by mouth.    [provider]  azelastine (ASTELIN) 0.1 % nasal spray Place 1 spray into both nostrils 2 (two) times daily. Use in each nostril as directed    [provider]  buPROPion (WELLBUTRIN XL) 150 MG 24 hr tablet Take by mouth.    [provider]  Cholecalciferol (VITAMIN D3)  1000 units CAPS Take 1 capsule by mouth 2 (two) times daily.     [provider]  cycloSPORINE (RESTASIS) 0.05 % ophthalmic emulsion Apply to eye.    [provider]  cycloSPORINE, PF, (CEQUA) 0.09 % SOLN  08/26/20   [provider]  diazepam (VALIUM) 5 MG tablet Take one tablet po one hour prior to procedure. Must have a driver. 12/15/21   Kirsteins, Victorino Sparrow, MD  diclofenac Sodium (VOLTAREN) 1 % GEL diclofenac 1 % topical gel  apply 2 grams to affected area two to three times a day    [provider]  escitalopram (LEXAPRO) 10 MG tablet Take by mouth.    [provider]  esomeprazole (NEXIUM) 40 MG capsule Take 40 mg by mouth 2 (two) times daily before a meal.    [provider]  ezetimibe (ZETIA) 10 MG tablet Take 10 mg by mouth at bedtime.    [provider]  fluticasone (FLONASE) 50 MCG/ACT nasal spray Place 1 spray into both nostrils daily.    [provider]  Fluticasone Propionate, Inhal, 50 MCG/ACT AEPB Inhale into the lungs.    [provider]  levothyroxine (SYNTHROID, LEVOTHROID) 50 MCG tablet Take 50 mcg by mouth at bedtime.    [provider]  linaclotide Karlene Einstein) 290 MCG CAPS capsule  11/24/20   [provider]  Magnesium 250 MG TABS     [provider]  naproxen sodium (ALEVE) 220 MG tablet Take 220 mg by mouth 2 (two) times daily as needed (pain).    [provider]  ondansetron (ZOFRAN-ODT) 8 MG disintegrating tablet  01/25/21   [provider]  OVER THE COUNTER MEDICATION Apply 1 application topically daily as needed (pain). Natural pain relief ointment    [provider]  predniSONE (STERAPRED UNI-PAK 21 TAB) 10 MG (21) TBPK tablet  07/21/20   [provider]  Probiotic Product (PROBIOTIC ADVANCED PO) Take 1 tablet by mouth daily.    [provider]  rosuvastatin (CRESTOR) 5 MG tablet Take 5 mg by mouth at bedtime.     [provider]  sodium chloride (OCEAN) 0.65 % SOLN nasal spray Place 4 sprays into both nostrils 4 (four) times daily.    [provider]  SUPER B COMPLEX/C PO Take 1 tablet by mouth daily.    [provider]  traMADol Janean Sark) 50 MG tablet  01/25/21   [provider]  tretinoin (RETIN-A) 0.1 % cream  03/02/21   [provider]  UNABLE TO FIND Med Name: CBD Cream    [provider]  zolpidem (AMBIEN) 10 MG tablet zolpidem 10 mg tablet  take 1 tablet by mouth at bedtime if needed    [provider]    Family History    Family History  Problem Relation Age of Onset   Breast cancer Mother 85       deceased 43; TAH/BSO in 52s/50s   Dementia Father        deceased 74   Breast cancer Sister    Breast cancer Maternal Aunt 24       deceased 61s   Colon cancer Neg Hx    Stomach cancer Neg Hx    She indicated that her mother is deceased. She indicated that her father is deceased. She indicated that her sister is alive. She indicated that her brother is alive. She indicated that her maternal grandmother is deceased. She indicated that her maternal grandfather is  deceased. She indicated that her paternal grandmother is deceased. She indicated that her  paternal grandfather is deceased. She indicated that her maternal aunt is deceased. She indicated that the status of her neg hx is unknown.  Social History    Social History   Socioeconomic History   Marital status: Married    Spouse name: Not on file   Number of children: 2   Years of education: Not on file   Highest education level: Not on file  Occupational History   Occupation: Retired    Associate Professor: [employer]  Tobacco Use   Smoking status: Never   Smokeless tobacco: Never  Vaping Use   Vaping Use: Never used  Substance and Sexual Activity   Alcohol use: No   Drug use: No   Sexual activity: Not on file  Other Topics Concern   Not on file  Social History Narrative   Not on file   Social Determinants of Health   Financial Resource Strain: Not on file  Food Insecurity: Not on file  Transportation Needs: No Transportation Needs (09/07/2018)   PRAPARE - Administrator, Civil Service (Medical): No    Lack of Transportation (Non-Medical): No  Physical Activity: Not on file  Stress: Not on file  Social Connections: Not on file  Intimate Partner Violence: Not At Risk (09/07/2018)   Humiliation, Afraid, Rape, and Kick questionnaire    Fear of Current or Ex-Partner: No    Emotionally Abused: No    Physically Abused: No    Sexually Abused: No     Review of Systems    General:  No chills, fever, night sweats or weight changes.  Cardiovascular:  No chest pain, dyspnea on exertion, edema, orthopnea, palpitations, paroxysmal nocturnal dyspnea. Dermatological: No rash, lesions/masses Respiratory: No cough, dyspnea Urologic: No hematuria, dysuria Abdominal:   No nausea, vomiting, diarrhea, bright red blood per rectum, melena, or hematemesis Neurologic:  No visual changes, wkns, changes in mental status. All other systems reviewed and are otherwise negative except as noted above.  Physical Exam    VS:  BP (!) 142/80 (BP Location: Left Arm, Patient Position:  Sitting, Cuff Size: Normal)   Pulse (!) 49   Resp (!) 84   Ht 5\' 8"  (1.727 m)   Wt 153 lb 9.6 oz (69.7 kg)   BMI 23.35 kg/m  , BMI Body mass index is 23.35 kg/m. GEN: Well nourished, well developed, in no acute distress. HEENT: normal. Neck: Supple, no JVD, carotid bruits, or masses. Cardiac: RRR, no murmurs, rubs, or gallops. No clubbing, cyanosis, edema.  Radials/DP/PT 2+ and equal bilaterally.  Respiratory:  Respirations regular and unlabored, clear to auscultation bilaterally. GI: Soft, nontender, nondistended, BS + x 4. MS: no deformity or atrophy. Skin: warm and dry, no rash. Neuro:  Strength and sensation are intact. Psych: Normal affect.  Accessory Clinical Findings    Recent Labs: No results found for requested labs within last 365 days.   Recent Lipid Panel    Component Value Date/Time   CHOL 133 08/08/2011 0913   TRIG 151.0 (H) 08/08/2011 0913   HDL 43.10 08/08/2011 0913   CHOLHDL 3 08/08/2011 0913   VLDL 30.2 08/08/2011 0913   LDLCALC 60 08/08/2011 0913    ECG personally reviewed by me today-none today.  EKG 03/15/2023 normal sinus rhythm possible right ventricular hypertrophy 94 bpm  EKG 04/04/2022 normal sinus rhythm incomplete right bundle branch block possible right ventricular hypertrophy 74 bpm- No acute changes  EKG 08/04/2020  Sinus rhythm 100 bpm, incomplete RBBB, inferior lateral infarct, no acute changes from 08/2019  Assessment & Plan   1.  Elevated heart rate-heart rate today 49.  Prior EKG  showed heart rate of 94.  Normal sinus rhythm possible right ventricular hypertrophy.  Avoid triggers caffeine, chocolate, EtOH, dehydration etc. Continue propranolol 10 mg twice daily Maintain p.o. hydration  Orthostasis-Stable. Continues to note only occasional brief episodes with standing quickly.   Maintain p.o. hydration Maintain sodium in diet Change positions slowly Continue to monitor  History of chest discomfort-denies chest pain.  Prior stress  testing which was negative and showed normal LVEF. Continue aspirin No plans for ischemic evaluation at this time.   Hyperlipidemia-LDL 74 on 11/07/2022 Continue aspirin, rosuvastatin High-fiber diet Follows with PCP   Disposition: Follow-up with Dr. Jens Som or me in 9 months .    Thomasene Ripple. Sacha Topor NP-C    06/14/2023, 3:38 PM [facility] [address] Office [phone] Fax [phone]  Notice: This dictation was prepared with Dragon dictation along with smaller phrase technology. Any transcriptional errors that result from this process are unintentional and may not be corrected upon review.  I spent 13 minutes examining this patient, reviewing medications, and using patient centered shared decision making involving her cardiac care.  Prior to her visit I spent greater than 20 minutes reviewing her past medical history,  medications, and prior cardiac tests.

## 2023-06-14 ENCOUNTER — Encounter: Payer: Self-pay | Admitting: General Practice

## 2023-06-14 ENCOUNTER — Ambulatory Visit: Payer: Medicare Other | Attending: General Practice | Admitting: General Practice

## 2023-06-14 VITALS — BP 142/80 | HR 49 | Resp 84 | Ht 68.0 in | Wt 153.6 lb

## 2023-06-14 DIAGNOSIS — I951 Orthostatic hypotension: Secondary | ICD-10-CM | POA: Diagnosis not present

## 2023-06-14 DIAGNOSIS — R030 Elevated blood-pressure reading, without diagnosis of hypertension: Secondary | ICD-10-CM | POA: Diagnosis not present

## 2023-06-14 DIAGNOSIS — R0789 Other chest pain: Secondary | ICD-10-CM

## 2023-06-14 DIAGNOSIS — E7800 Pure hypercholesterolemia, unspecified: Secondary | ICD-10-CM

## 2023-06-14 DIAGNOSIS — R009 Unspecified abnormalities of heart beat: Secondary | ICD-10-CM

## 2023-06-14 MED ORDER — PROPRANOLOL HCL 10 MG PO TABS
ORAL_TABLET | ORAL | 2 refills | Status: DC
Start: 2023-06-14 — End: 2024-01-09

## 2023-06-14 NOTE — Patient Instructions (Signed)
Medication Instructions:  The current medical regimen is effective;  continue present plan and medications as directed. Please refer to the Current Medication list given to you today.  *If you need a refill on your cardiac medications before your next appointment, please call your pharmacy*  Lab Work: NONE If you have labs (blood work) drawn today and your tests are completely normal, you will receive your results only by:  MyChart Message (if you have MyChart) OR A paper copy in the mail If you have any lab test that is abnormal or we need to change your treatment, we will call you to review the results.  Other Instructions INCREASE HYDRATION  Follow-Up: At [facility], you and your health needs are our priority.  As part of our continuing mission to provide you with exceptional heart care, we have created designated Provider Care Teams.  These Care Teams include your primary Cardiologist (physician) and Advanced Practice Providers (APPs -  Physician Assistants and Nurse Practitioners) who all work together to provide you with the care you need, when you need it.  Your next appointment:   9 month(s)  Provider:   Olga Millers, MD

## 2023-06-21 DIAGNOSIS — M4316 Spondylolisthesis, lumbar region: Secondary | ICD-10-CM | POA: Diagnosis not present

## 2023-06-27 DIAGNOSIS — I729 Aneurysm of unspecified site: Secondary | ICD-10-CM | POA: Diagnosis not present

## 2023-07-10 DIAGNOSIS — M5116 Intervertebral disc disorders with radiculopathy, lumbar region: Secondary | ICD-10-CM | POA: Diagnosis not present

## 2023-07-10 DIAGNOSIS — M5417 Radiculopathy, lumbosacral region: Secondary | ICD-10-CM | POA: Diagnosis not present

## 2023-07-17 DIAGNOSIS — H04123 Dry eye syndrome of bilateral lacrimal glands: Secondary | ICD-10-CM | POA: Diagnosis not present

## 2023-08-10 DIAGNOSIS — I729 Aneurysm of unspecified site: Secondary | ICD-10-CM | POA: Diagnosis not present

## 2023-09-04 DIAGNOSIS — M4316 Spondylolisthesis, lumbar region: Secondary | ICD-10-CM | POA: Diagnosis not present

## 2023-09-11 ENCOUNTER — Inpatient Hospital Stay: Payer: Medicare Other | Attending: Hematology and Oncology | Admitting: Adult Health

## 2023-09-11 ENCOUNTER — Encounter: Payer: Self-pay | Admitting: Adult Health

## 2023-09-11 VITALS — BP 135/80 | HR 81 | Temp 97.3°F | Resp 17 | Wt 155.5 lb

## 2023-09-11 DIAGNOSIS — C50412 Malignant neoplasm of upper-outer quadrant of left female breast: Secondary | ICD-10-CM | POA: Insufficient documentation

## 2023-09-11 DIAGNOSIS — Z170 Estrogen receptor positive status [ER+]: Secondary | ICD-10-CM | POA: Diagnosis not present

## 2023-09-11 DIAGNOSIS — M7989 Other specified soft tissue disorders: Secondary | ICD-10-CM | POA: Diagnosis not present

## 2023-09-11 DIAGNOSIS — Z923 Personal history of irradiation: Secondary | ICD-10-CM | POA: Diagnosis not present

## 2023-09-11 NOTE — Progress Notes (Signed)
[facility] Cancer Follow up:    Angela Corn, MD [address] [place] [address]   DIAGNOSIS:  Cancer Staging  Malignant neoplasm of upper-outer quadrant of left breast in female, estrogen receptor positive (HCC) Staging form: Breast, AJCC 8th Edition - Clinical stage from 03/21/2018: Stage IA (cT1c, cN0, cM0, G2, ER+, PR+, HER2-) - Unsigned Histologic grading system: 3 grade system Laterality: Left Staged by: Pathologist and managing physician Stage used in treatment planning: Yes National guidelines used in treatment planning: Yes Type of national guideline used in treatment planning: NCCN - Pathologic: Stage IA (pT1c, pN0, cM0, G2, ER+, PR+, HER2-) - Unsigned Histologic grading system: 3 grade system   SUMMARY OF ONCOLOGIC HISTORY: 03/08/2018: Left Breast:  Grade 1-2 IDC, ER 100% and PR 100%, Ki 67 < 1%, Her 2 Neg Ratio 1.61; Additional Bx: Grade 2 ILC ER/PR Pos Her 2 Neg, Ki 1-5% 04/17/2018: Bil lumpectomies: RT: ALH, Left: Grade 2 ILC, margins cleared with additional surgery Oncotype 14 (ROR 4%); 4 SLN removed 07/09/2018 - 08/03/2018 : XRT 09/11/2018- June 2020: Tamoxifen (cognitive issues) Genetics Neg 09/26/2019- 11/26/2019: Letrozole (cognitive issues) 12/24/2020- 02/2021: Exemestane discontinued due to AE 07/15/2021-07/2022): Anastrozole discontinued due to AE  CURRENT THERAPY: Observation alone  INTERVAL HISTORY: [name] 77 y.o. female returns for follow-up of her history of breast cancer.  Her most recent bilateral breast mammogram occurred on January 06, 2023 demonstrating no mammographic evidence of malignancy and breast density category D.  She was recommended at that time to consider supplemental breast MRIs.  [name] tells me that she is going on a 7 hour trip.  She has a lymphedema sleeve and glove she was given by PT for prevention prior to her flight.  She is to know if she has to wear this because it is fitting tight like a tourniquet and  she cannot fathom being able to wear it during a 7-hour flight.  She had four lymph nodes removed in her previous surgery.    She also brought me an article about breast health, mammograms, and breast MRIs.  She tells me the last time she had a breast MRI the radiologist used a drill and she bled heavily.  She said after that experience Dr. Dwain Sarna told her to forego breast MRIs and she has since.  In her chart the last MRI result is that of a MRI guided biopsy.  Patient Active Problem List   Diagnosis Date Noted   Spinal stenosis of lumbar region with radiculopathy 10/12/2021   Chronic postoperative pain 10/12/2021   Breast mass, right 03/27/2020   Hepatic steatosis 01/22/2020   Genetic testing 04/02/2018   Family history of breast cancer    Malignant neoplasm of upper-outer quadrant of left breast in female, estrogen receptor positive (HCC) 03/16/2018   OA (osteoarthritis) of knee 09/11/2017   Infrapatellar bursitis of right knee 04/29/2014   Pneumonia, aspiration (HCC) 12/28/2012   HYPOTHYROIDISM 11/15/2010   HYPERLIPIDEMIA 11/15/2010   DEPRESSION 11/15/2010   GERD 11/15/2010   CONSTIPATION 11/15/2010   GALLSTONES 11/15/2010   RENAL CYST 11/15/2010   INSOMNIA UNSPECIFIED 11/15/2010   COUGH 11/15/2010   ABDOMINAL PAIN OTHER SPECIFIED SITE 11/15/2010   TRANSAMINASES, SERUM, ELEVATED 11/15/2010    is allergic to oxycontin [oxycodone hcl] and codeine.  MEDICAL HISTORY: Past Medical History:  Diagnosis Date   Arthritis    hands and knees   Complication of anesthesia    aspiration pna; following a colonoscopy, pt requests head of bed elevated  if possible.   Constipation    Cough    Depression    Dysrhythmia    RBBB on 06-06-17 ekg    Elevated liver function tests    Esophageal stricture    Family history of breast cancer    Gallstones    Genetic testing 04/02/2018   STAT Breast panel with reflex to Multi-Cancer panel (83 genes) @ [facility] - No pathogenic mutations  detected   GERD (gastroesophageal reflux disease)    Hiatal hernia    History of kidney stones    History of radiation therapy 07/09/2018- 08/03/18   Left Breast/ 40.05 Gy in 15 fractions. Left Breast boost 10 Gy in 5 fractions.    Hyperlipemia    Hypothyroidism    Insomnia    Malignant neoplasm of upper-outer quadrant of left female breast (HCC)    Nephrolithiasis    Personal history of radiation therapy    Pneumonia 2015   aspirated after colonosopy   Renal cyst     SURGICAL HISTORY: Past Surgical History:  Procedure Laterality Date   BREAST BIOPSY Left 05/31/2019   mri   BREAST BIOPSY Left 04/2019   malignant   BREAST LUMPECTOMY Left 04/17/2018   malignant   BREAST LUMPECTOMY WITH RADIOACTIVE SEED AND SENTINEL LYMPH NODE BIOPSY Left 04/17/2018   Procedure: LEFT BREAST LUMPECTOMY WITH BRACKETED RADIOACTIVE SEEDS AND SENTINEL LYMPH NODE BIOPSY;  Surgeon: Emelia Loron, MD;  Location: [facility];  Service: General;  Laterality: Left;   BREAST SURGERY Left    Lumpectomy   CARDIOVASCULAR STRESS TEST  07/19/2006   EF 70%, NO EVIDENCE OF ISCHEMIA   CHOLECYSTECTOMY  12/30/10   COLONOSCOPY     DILATION AND CURETTAGE OF UTERUS     HYSTEROSCOPY WITH D & C N/A 06/07/2018   Procedure: DILATATION AND CURETTAGE /HYSTEROSCOPY;  Surgeon: Marcelle Overlie, MD;  Location: [facility];  Service: Gynecology;  Laterality: N/A;   inguinal herniography     NASAL SINUS SURGERY     RADIOACTIVE SEED GUIDED EXCISIONAL BREAST BIOPSY Right 04/17/2018   Procedure: RIGHT BREAST SEED GUIDED EXCISIONAL BIOPSY;  Surgeon: Emelia Loron, MD;  Location: [facility];  Service: General;  Laterality: Right;   RE-EXCISION OF BREAST LUMPECTOMY Left 05/15/2018   Procedure: RE-EXCISION OF LEFT BREAST LUMPECTOMY, ASPIRATION OF LEFT AXILLARY SEROMA;  Surgeon: Emelia Loron, MD;  Location: [facility];  Service: General;  Laterality: Left;   T & A     TOOTH EXTRACTION      TOTAL KNEE ARTHROPLASTY Left 09/11/2017   Procedure: LEFT TOTAL KNEE ARTHROPLASTY;  Surgeon: Ollen Gross, MD;  Location: [facility];  Service: Orthopedics;  Laterality: Left;  with block   US ECHOCARDIOGRAPHY  07/17/2006   EF 55-60%    SOCIAL HISTORY: Social History   Socioeconomic History   Marital status: Married    Spouse name: Not on file   Number of children: 2   Years of education: Not on file   Highest education level: Not on file  Occupational History   Occupation: Retired    Associate Professor: [employer]  Tobacco Use   Smoking status: Never   Smokeless tobacco: Never  Vaping Use   Vaping status: Never Used  Substance and Sexual Activity   Alcohol use: No   Drug use: No   Sexual activity: Not on file  Other Topics Concern   Not on file  Social History Narrative   Not on file   Social Determinants of Health  Financial Resource Strain: Low Risk  (05/22/2023)   Received from [facility], [facility]   Overall Financial Resource Strain (CARDIA)    Difficulty of Paying Living Expenses: Not hard at all  Food Insecurity: No Food Insecurity (05/22/2023)   [facility], [facility]   Hunger Vital Sign    Worried About Running Out of Food in the Last Year: Never true    Ran Out of Food in the Last Year: Never true  Transportation Needs: No Transportation Needs (05/22/2023)   Received from [facility], [facility]   PRAPARE - Transportation    Lack of Transportation (Medical): No    Lack of Transportation (Non-Medical): No  Physical Activity: Not on file  Stress: Not on file  Social Connections: Unknown (04/25/2022)   Received from [facility], [facility]   Social Network    Social Network: Not on file  Intimate Partner Violence: Unknown (03/17/2022)   Received from [facility], [facility]   HITS    Physically Hurt: Not on file    Insult or Talk Down To: Not on file    Threaten Physical Harm: Not on file    Scream or Curse: Not on file     FAMILY HISTORY: Family History  Problem Relation Age of Onset   Breast cancer Mother 70       deceased 30; TAH/BSO in 71s/50s   Dementia Father        deceased 51   Breast cancer Sister    Breast cancer Maternal Aunt 54       deceased 66s   Colon cancer Neg Hx    Stomach cancer Neg Hx     Review of Systems  Constitutional:  Negative for appetite change, chills, fatigue, fever and unexpected weight change.  HENT:   Negative for hearing loss, lump/mass and trouble swallowing.   Eyes:  Negative for eye problems and icterus.  Respiratory:  Negative for chest tightness, cough and shortness of breath.   Cardiovascular:  Negative for chest pain, leg swelling and palpitations.  Gastrointestinal:  Negative for abdominal distention, abdominal pain, constipation, diarrhea, nausea and vomiting.  Endocrine: Negative for hot flashes.  Genitourinary:  Negative for difficulty urinating.   Musculoskeletal:  Negative for arthralgias.  Skin:  Negative for itching and rash.  Neurological:  Negative for dizziness, extremity weakness, headaches and numbness.  Hematological:  Negative for adenopathy. Does not bruise/bleed easily.  Psychiatric/Behavioral:  Negative for depression. The patient is not nervous/anxious.       PHYSICAL EXAMINATION    Vitals:   09/11/23 1217  BP: 135/80  Pulse: 81  Resp: 17  Temp: (!) 97.3 F (36.3 C)  SpO2: 99%    Physical Exam Constitutional:      General: She is not in acute distress.    Appearance: Normal appearance. She is not toxic-appearing.  HENT:     Head: Normocephalic and atraumatic.     Mouth/Throat:     Mouth: Mucous membranes are moist.     Pharynx: Oropharynx is clear. No oropharyngeal exudate or posterior oropharyngeal erythema.  Eyes:     General: No scleral icterus. Cardiovascular:     Rate and Rhythm: Normal rate and regular rhythm.     Pulses: Normal pulses.     Heart sounds: Normal heart sounds.  Pulmonary:     Effort:  Pulmonary effort is normal.     Breath sounds: Normal breath sounds.  Chest:     Comments: Status post bilateral lumpectomies,  right breast is benign, left breast status post radiation as well as lumpectomy, no sign of local recurrence. Abdominal:     General: Abdomen is flat. Bowel sounds are normal. There is no distension.     Palpations: Abdomen is soft.     Tenderness: There is no abdominal tenderness.  Musculoskeletal:        General: Swelling present.     Cervical back: Neck supple.     Comments: + left arm swelling noted in forearm and upper arm   Lymphadenopathy:     Cervical: No cervical adenopathy.  Skin:    General: Skin is warm and dry.     Findings: No rash.  Neurological:     General: No focal deficit present.     Mental Status: She is alert.  Psychiatric:        Mood and Affect: Mood normal.        Behavior: Behavior normal.      ASSESSMENT and THERAPY PLAN:   Malignant neoplasm of upper-outer quadrant of left breast in female, estrogen receptor positive (HCC) 03/08/2018: Left Breast:  Grade 1-2 IDC, ER 100% and PR 100%, Ki 67 < 1%, Her 2 Neg Ratio 1.61; Additional Bx: Grade 2 ILC ER/PR Pos Her 2 Neg, Ki 1-5% 04/17/2018: Bil lumpectomies: RT: ALH, Left: Grade 2 ILC, margins cleared with additional surgery Oncotype 14 (ROR 4%) 07/09/2018 - 08/03/2018 : XRT 09/11/2018- June 2020: Tamoxifen (cognitive issues) Genetics Neg 09/26/2019- 11/26/2019: Letrozole )cognitive issues) 12/24/2020- 02/2021: Exemestane discontinued due to AE Unable to tolerate Anastrozole  Breast cancer surveillance: 1.  Breast exam 99/30/2024: Benign 2. Bilateral breast mammogram: 01/06/2023, benign, breast density category D.   3.  I recommended breast MRI alternating with mammogram per the standard of care for intensified screening for breast density category D.  Since she had a bad experience with this she does not want to proceed with this option we discussed contrast enhanced mammogram.    Left arm swelling: [name] has undergone left breast lumpectomy and lymph node biopsy.  She has some mild left arm swelling visible on exam.  I recommended she have her sleeve evaluated by PT to ensure it is the right size.  She does not have time to do this before her trip.  She met with Dr. Pamelia Hoit as well to talk about her lymphedema prior to her trip out of town.  He let her know that she has a 10% risk of developing lymphedema and suggested that she forego wearing the sleeve on the way there and if she developed it that she could wear it on and off on the way home.  RTC in December as scheduled for continued f/u.        All questions were answered. The patient knows to call the clinic with any problems, questions or concerns. We can certainly see the patient much sooner if necessary.  Total encounter time:30 minutes*in face-to-face visit time, chart review, lab review, care coordination, order entry, and documentation of the encounter time.    Lillard Anes, NP 09/11/23 1:34 PM Medical Oncology and Hematology [facility] [address] [place], [place] [address] Tel. [phone]    Fax. [phone]  *Total Encounter Time as defined by the Centers for Medicare and Medicaid Services includes, in addition to the face-to-face time of a patient visit (documented in the note above) non-face-to-face time: obtaining and reviewing outside history, ordering and reviewing medications, tests or procedures, care coordination (communications with other health care professionals  or caregivers) and documentation in the medical record.

## 2023-09-11 NOTE — Assessment & Plan Note (Addendum)
03/08/2018: Left Breast:  Grade 1-2 IDC, ER 100% and PR 100%, Ki 67 < 1%, Her 2 Neg Ratio 1.61; Additional Bx: Grade 2 ILC ER/PR Pos Her 2 Neg, Ki 1-5% 04/17/2018: Bil lumpectomies: RT: ALH, Left: Grade 2 ILC, margins cleared with additional surgery Oncotype 14 (ROR 4%) 07/09/2018 - 08/03/2018 : XRT 09/11/2018- June 2020: Tamoxifen (cognitive issues) Genetics Neg 09/26/2019- 11/26/2019: Letrozole )cognitive issues) 12/24/2020- 02/2021: Exemestane discontinued due to AE Unable to tolerate Anastrozole  Breast cancer surveillance: 1.  Breast exam 99/30/2024: Benign 2. Bilateral breast mammogram: 01/06/2023, benign, breast density category D.   3.  I recommended breast MRI alternating with mammogram per the standard of care for intensified screening for breast density category D.  Since she had a bad experience with this she does not want to proceed with this option we discussed contrast enhanced mammogram.   Left arm swelling: [name] has undergone left breast lumpectomy and lymph node biopsy.  She has some mild left arm swelling visible on exam.  I recommended she have her sleeve evaluated by PT to ensure it is the right size.  She does not have time to do this before her trip.  She met with Dr. Pamelia Hoit as well to talk about her lymphedema prior to her trip out of town.  He let her know that she has a 10% risk of developing lymphedema and suggested that she forego wearing the sleeve on the way there and if she developed it that she could wear it on and off on the way home.  RTC in December as scheduled for continued f/u.

## 2023-09-25 ENCOUNTER — Encounter: Payer: Self-pay | Admitting: Adult Health

## 2023-09-29 DIAGNOSIS — Z1152 Encounter for screening for COVID-19: Secondary | ICD-10-CM | POA: Diagnosis not present

## 2023-09-29 DIAGNOSIS — J029 Acute pharyngitis, unspecified: Secondary | ICD-10-CM | POA: Diagnosis not present

## 2023-09-29 DIAGNOSIS — R0981 Nasal congestion: Secondary | ICD-10-CM | POA: Diagnosis not present

## 2023-09-29 DIAGNOSIS — U071 COVID-19: Secondary | ICD-10-CM | POA: Diagnosis not present

## 2023-09-29 DIAGNOSIS — H9201 Otalgia, right ear: Secondary | ICD-10-CM | POA: Diagnosis not present

## 2023-09-29 DIAGNOSIS — R051 Acute cough: Secondary | ICD-10-CM | POA: Diagnosis not present

## 2023-09-29 DIAGNOSIS — R350 Frequency of micturition: Secondary | ICD-10-CM | POA: Diagnosis not present

## 2023-10-09 ENCOUNTER — Other Ambulatory Visit: Payer: Self-pay | Admitting: Adult Health

## 2023-10-09 ENCOUNTER — Other Ambulatory Visit: Payer: Self-pay | Admitting: *Deleted

## 2023-10-09 DIAGNOSIS — Z170 Estrogen receptor positive status [ER+]: Secondary | ICD-10-CM

## 2023-10-24 ENCOUNTER — Encounter (INDEPENDENT_AMBULATORY_CARE_PROVIDER_SITE_OTHER): Payer: Self-pay | Admitting: Otolaryngology

## 2023-11-14 ENCOUNTER — Ambulatory Visit: Payer: Medicare Other | Admitting: Hematology and Oncology

## 2023-11-14 DIAGNOSIS — D1801 Hemangioma of skin and subcutaneous tissue: Secondary | ICD-10-CM | POA: Diagnosis not present

## 2023-11-14 DIAGNOSIS — K5900 Constipation, unspecified: Secondary | ICD-10-CM | POA: Diagnosis not present

## 2023-11-14 DIAGNOSIS — K639 Disease of intestine, unspecified: Secondary | ICD-10-CM | POA: Diagnosis not present

## 2023-11-14 DIAGNOSIS — L820 Inflamed seborrheic keratosis: Secondary | ICD-10-CM | POA: Diagnosis not present

## 2023-11-14 DIAGNOSIS — M859 Disorder of bone density and structure, unspecified: Secondary | ICD-10-CM | POA: Diagnosis not present

## 2023-11-14 DIAGNOSIS — E039 Hypothyroidism, unspecified: Secondary | ICD-10-CM | POA: Diagnosis not present

## 2023-11-14 DIAGNOSIS — Z1212 Encounter for screening for malignant neoplasm of rectum: Secondary | ICD-10-CM | POA: Diagnosis not present

## 2023-11-14 DIAGNOSIS — E785 Hyperlipidemia, unspecified: Secondary | ICD-10-CM | POA: Diagnosis not present

## 2023-11-14 DIAGNOSIS — L821 Other seborrheic keratosis: Secondary | ICD-10-CM | POA: Diagnosis not present

## 2023-11-14 LAB — TSH: TSH: 0.34 — AB (ref 0.41–5.90)

## 2023-11-14 LAB — T4, FREE: Free T4: 0.7 ng/dL

## 2023-11-15 ENCOUNTER — Ambulatory Visit (INDEPENDENT_AMBULATORY_CARE_PROVIDER_SITE_OTHER): Payer: Medicare Other | Admitting: Audiology

## 2023-11-15 ENCOUNTER — Ambulatory Visit (INDEPENDENT_AMBULATORY_CARE_PROVIDER_SITE_OTHER): Payer: Medicare Other | Admitting: Otolaryngology

## 2023-11-15 ENCOUNTER — Encounter (INDEPENDENT_AMBULATORY_CARE_PROVIDER_SITE_OTHER): Payer: Self-pay

## 2023-11-15 VITALS — Ht 67.0 in | Wt 160.0 lb

## 2023-11-15 DIAGNOSIS — J310 Chronic rhinitis: Secondary | ICD-10-CM | POA: Diagnosis not present

## 2023-11-15 DIAGNOSIS — H90A22 Sensorineural hearing loss, unilateral, left ear, with restricted hearing on the contralateral side: Secondary | ICD-10-CM

## 2023-11-15 DIAGNOSIS — H918X1 Other specified hearing loss, right ear: Secondary | ICD-10-CM

## 2023-11-15 DIAGNOSIS — H6981 Other specified disorders of Eustachian tube, right ear: Secondary | ICD-10-CM | POA: Diagnosis not present

## 2023-11-15 DIAGNOSIS — H90A31 Mixed conductive and sensorineural hearing loss, unilateral, right ear with restricted hearing on the contralateral side: Secondary | ICD-10-CM | POA: Diagnosis not present

## 2023-11-15 DIAGNOSIS — R0981 Nasal congestion: Secondary | ICD-10-CM

## 2023-11-15 DIAGNOSIS — H903 Sensorineural hearing loss, bilateral: Secondary | ICD-10-CM | POA: Insufficient documentation

## 2023-11-15 NOTE — Progress Notes (Signed)
Patient ID: [name], female   DOB: 02-Jan-1946, 77 y.o.   MRN: [id number]  CC: Right ear hearing loss, right ear pain, eustachian tube dysfunction  HPI:  [name] is a 77 y.o. female who presents today complaining of right ear hearing loss and right ear pain since middle of October.  Her symptoms started after a flight from [place].  At that time, she was experiencing an upper respiratory infection.  After she returned home, she noted difficulty hearing on the right side.  She also complains of right ear pain and pressure sensation.  She was noted to have right middle ear effusion.  The patient has a history of environmental allergies.  She has been using Flonase nasal spray for several years.  The patient also has a history of bilateral sensorineural hearing loss.  She was fitted with bilateral hearing aids in August 2024.  She underwent adenotonsillectomy surgery as a child.  She has no other ENT surgery.  Past Medical History:  Diagnosis Date   Arthritis    hands and knees   Complication of anesthesia    aspiration pna; following a colonoscopy, pt requests head of bed elevated if possible.   Constipation    Cough    Depression    Dysrhythmia    RBBB on 06-06-17 ekg    Elevated liver function tests    Esophageal stricture    Family history of breast cancer    Gallstones    Genetic testing 04/02/2018   STAT Breast panel with reflex to Multi-Cancer panel (83 genes) @ [facility] - No pathogenic mutations detected   GERD (gastroesophageal reflux disease)    Hiatal hernia    History of kidney stones    History of radiation therapy 07/09/2018- 08/03/18   Left Breast/ 40.05 Gy in 15 fractions. Left Breast boost 10 Gy in 5 fractions.    Hyperlipemia    Hypothyroidism    Insomnia    Malignant neoplasm of upper-outer quadrant of left female breast (HCC)    Nephrolithiasis    Personal history of radiation therapy    Pneumonia 2015   aspirated after colonosopy   Renal cyst      Past Surgical History:  Procedure Laterality Date   BREAST BIOPSY Left 05/31/2019   mri   BREAST BIOPSY Left 04/2019   malignant   BREAST LUMPECTOMY Left 04/17/2018   malignant   BREAST LUMPECTOMY WITH RADIOACTIVE SEED AND SENTINEL LYMPH NODE BIOPSY Left 04/17/2018   Procedure: LEFT BREAST LUMPECTOMY WITH BRACKETED RADIOACTIVE SEEDS AND SENTINEL LYMPH NODE BIOPSY;  Surgeon: Emelia Loron, MD;  Location: [facility];  Service: General;  Laterality: Left;   BREAST SURGERY Left    Lumpectomy   CARDIOVASCULAR STRESS TEST  07/19/2006   EF 70%, NO EVIDENCE OF ISCHEMIA   CHOLECYSTECTOMY  12/30/10   COLONOSCOPY     DILATION AND CURETTAGE OF UTERUS     HYSTEROSCOPY WITH D & C N/A 06/07/2018   Procedure: DILATATION AND CURETTAGE /HYSTEROSCOPY;  Surgeon: Marcelle Overlie, MD;  Location: [facility];  Service: Gynecology;  Laterality: N/A;   inguinal herniography     NASAL SINUS SURGERY     RADIOACTIVE SEED GUIDED EXCISIONAL BREAST BIOPSY Right 04/17/2018   Procedure: RIGHT BREAST SEED GUIDED EXCISIONAL BIOPSY;  Surgeon: Emelia Loron, MD;  Location: [facility];  Service: General;  Laterality: Right;   RE-EXCISION OF BREAST LUMPECTOMY Left 05/15/2018   Procedure: RE-EXCISION OF LEFT BREAST LUMPECTOMY, ASPIRATION OF LEFT AXILLARY SEROMA;  Surgeon: Emelia Loron, MD;  Location: [facility];  Service: General;  Laterality: Left;   T & A     TOOTH EXTRACTION     TOTAL KNEE ARTHROPLASTY Left 09/11/2017   Procedure: LEFT TOTAL KNEE ARTHROPLASTY;  Surgeon: Ollen Gross, MD;  Location: [facility];  Service: Orthopedics;  Laterality: Left;  with block   US ECHOCARDIOGRAPHY  07/17/2006   EF 55-60%    Family History  Problem Relation Age of Onset   Breast cancer Mother 94       deceased 20; TAH/BSO in 14s/50s   Dementia Father        deceased 37   Breast cancer Sister    Breast cancer Maternal Aunt 32       deceased 75s   Colon cancer Neg Hx    Stomach  cancer Neg Hx     Social History:  reports that she has never smoked. She has never used smokeless tobacco. She reports that she does not drink alcohol and does not use drugs.  Allergies:  Allergies  Allergen Reactions   Oxycontin [Oxycodone Hcl]     "it made me feel very high and hyper"    Codeine Nausea And Vomiting    Prior to Admission medications   Medication Sig Start Date End Date Taking? Authorizing Provider  aspirin 81 MG EC tablet Take by mouth.   Yes [provider]  azelastine (ASTELIN) 0.1 % nasal spray Place 1 spray into both nostrils 2 (two) times daily. Use in each nostril as directed   Yes [provider]  Cholecalciferol (VITAMIN D3) 1000 units CAPS Take 1 capsule by mouth 2 (two) times daily.    Yes [provider]  diclofenac Sodium (VOLTAREN) 1 % GEL diclofenac 1 % topical gel  apply 2 grams to affected area two to three times a day   Yes [provider]  esomeprazole (NEXIUM) 40 MG capsule Take 40 mg by mouth 2 (two) times daily before a meal.   Yes [provider]  ezetimibe (ZETIA) 10 MG tablet Take 10 mg by mouth at bedtime.   Yes [provider]  fluticasone (FLONASE) 50 MCG/ACT nasal spray Place 1 spray into both nostrils daily.   Yes [provider]  levothyroxine (SYNTHROID) 50 MCG tablet Take by mouth.   Yes [provider]  linaclotide Karlene Einstein) 290 MCG CAPS capsule  11/24/20  Yes [provider]  Magnesium 250 MG TABS    Yes [provider]  OVER THE COUNTER MEDICATION Apply 1 application  topically daily as needed (pain). Natural pain relief ointment   Yes [provider]  propranolol (INDERAL) 10 MG tablet TAKE 1 TABLET(10 MG) BY MOUTH TWICE DAILY 06/14/23  Yes Cleaver, Thomasene Ripple, NP  rosuvastatin (CRESTOR) 5 MG tablet Take 5 mg by mouth at bedtime.    Yes [provider]  SUPER B COMPLEX/C PO Take 1 tablet by mouth daily.   Yes [provider]  tretinoin (RETIN-A) 0.1 % cream  03/02/21  Yes [provider]  venlafaxine XR (EFFEXOR XR) 75 MG 24 hr capsule Take 75 mg by mouth daily with breakfast. 12/15/22  Yes [provider]  Vitamins-Lipotropics (COMPLEX B-100-INOSITOL) TBCR Take 1 tablet by mouth daily.   Yes [provider]  zolpidem (AMBIEN) 10 MG tablet zolpidem 10 mg tablet  take 1 tablet by mouth at bedtime if needed   Yes [provider]  diazepam (VALIUM) 5 MG tablet Take  one tablet po one hour prior to procedure. Must have a driver. Patient not taking: Reported on 03/15/2023 12/15/21   Erick Colace, MD    Height 5\' 7"  (1.702 m), weight 160 lb (72.6 kg). Exam: General: Communicates without difficulty, well nourished, no acute distress. Head: Normocephalic, no evidence injury, no tenderness, facial buttresses intact without stepoff. Face/sinus: No tenderness to palpation and percussion. Facial movement is normal and symmetric. Eyes: PERRL, EOMI. No scleral icterus, conjunctivae clear. Neuro: CN II exam reveals vision grossly intact.  No nystagmus at any point of gaze. Ears: Auricles well formed without lesions.  Ear canals are intact without mass or lesion.  No erythema or edema is appreciated.  The TMs are intact without fluid. Nose: External evaluation reveals normal support and skin without lesions.  Dorsum is intact.  Anterior rhinoscopy reveals congested mucosa over anterior aspect of inferior turbinates and intact septum.  No purulence noted. Oral:  Oral cavity and oropharynx are intact, symmetric, without erythema or edema.  Mucosa is moist without lesions. Neck: Full range of motion without pain.  There is no significant lymphadenopathy.  No masses palpable.  Thyroid bed within normal limits to palpation.  Parotid glands and submandibular glands equal bilaterally without mass.  Trachea is midline. Neuro:  CN 2-12 grossly intact.   Her hearing test shows bilateral moderate sensorineural  hearing loss, with slight conductive hearing loss component on the right.  Assessment: 1.  The patient's history and physical exam findings are consistent with right ear eustachian tube dysfunction. 2.  Chronic rhinitis with nasal mucosal congestion. 3.  Bilateral moderate sensorineural hearing loss, with slight right ear conductive hearing loss.  Plan: 1.  The physical exam findings and the hearing test results are reviewed with the patient. 2.  The patient is reassured that her right middle ear effusion has resolved. 3.  Continue with Flonase nasal spray daily. 4.  Valsalva exercise multiple times a day. 5.  The patient will return for reevaluation in 6 weeks.  Zaydin Billey W Brionne Mertz 11/15/2023, 1:08 PM

## 2023-11-15 NOTE — Progress Notes (Addendum)
  [address] [place], [place] [address] [phone]  Audiological Evaluation    Name: [name]     DOB:   [birth year]      MRN:   [id number]                                                                                     Service Date: 11/15/2023        Patient was referred today for a hearing evaluation by Dr. Karle Barr.   Symptoms Yes Details  Hearing loss  [x]  Patient reported perceiving right ear hearing loss.  Balance problems  [x]  Patient reported intermittent vertigo/imbalance sensations.  Previous ear surgeries  []  Patient denied any previous ear surgeries.  Amplification  [x]  Patient reported the use of bilateral hearing aids.    Tympanogram: Right ear: Normal external ear canal volume with normal middle ear pressure and tympanic membrane compliance (Type A). Left ear: Normal external ear canal volume with normal middle ear pressure and tympanic membrane compliance (Type A).    Hearing Evaluation: The audiogram was completed using conventional audiometric techniques under headphones with good reliability.   The hearing test results indicate: Right ear: Moderate mixed hearing loss from [phone] Hz sloping to moderately-severe hearing loss at 8000 Hz. Left ear: Mild sensorineural hearing loss from [phone] Hz sloping to moderate hearing loss at 8000 Hz. Asymmetry noted from 2000-8000 Hz, where the right ear is worse than the left ear.   Speech Audiometry: Right ear- Speech Reception Threshold (SRT) was obtained at 45 dBHL masked. Left ear- Speech Reception Threshold (SRT) was obtained at 35 dBHL.   Word Recognition Score Tested using NU-6 (MLV) Right ear: 92% was obtained at a presentation level of 85 dBHL with contralateral masking which is deemed as good word understanding. Left ear: 100% was obtained at a presentation level of 75 dBHL which is deemed as excellent understanding.    Impression:  There is not a significant difference  between word recognition scores between ears.   Recommendations: Repeat audiogram when changes are perceived or per MD. Continue use of hearing aids.   Conley Rolls Jamen Loiseau, AUD, CCC-A 11/15/23

## 2023-11-20 DIAGNOSIS — Z1212 Encounter for screening for malignant neoplasm of rectum: Secondary | ICD-10-CM | POA: Diagnosis not present

## 2023-11-20 DIAGNOSIS — E785 Hyperlipidemia, unspecified: Secondary | ICD-10-CM | POA: Diagnosis not present

## 2023-11-21 DIAGNOSIS — G4700 Insomnia, unspecified: Secondary | ICD-10-CM | POA: Diagnosis not present

## 2023-11-21 DIAGNOSIS — Z0000 Encounter for general adult medical examination without abnormal findings: Secondary | ICD-10-CM | POA: Diagnosis not present

## 2023-11-21 DIAGNOSIS — I725 Aneurysm of other precerebral arteries: Secondary | ICD-10-CM | POA: Diagnosis not present

## 2023-11-21 DIAGNOSIS — F329 Major depressive disorder, single episode, unspecified: Secondary | ICD-10-CM | POA: Diagnosis not present

## 2023-11-21 DIAGNOSIS — I700 Atherosclerosis of aorta: Secondary | ICD-10-CM | POA: Diagnosis not present

## 2023-11-21 DIAGNOSIS — E785 Hyperlipidemia, unspecified: Secondary | ICD-10-CM | POA: Diagnosis not present

## 2023-11-21 DIAGNOSIS — M5430 Sciatica, unspecified side: Secondary | ICD-10-CM | POA: Diagnosis not present

## 2023-11-21 DIAGNOSIS — C50412 Malignant neoplasm of upper-outer quadrant of left female breast: Secondary | ICD-10-CM | POA: Diagnosis not present

## 2023-11-21 DIAGNOSIS — E039 Hypothyroidism, unspecified: Secondary | ICD-10-CM | POA: Diagnosis not present

## 2023-11-21 DIAGNOSIS — G8929 Other chronic pain: Secondary | ICD-10-CM | POA: Diagnosis not present

## 2023-11-21 DIAGNOSIS — R413 Other amnesia: Secondary | ICD-10-CM | POA: Diagnosis not present

## 2023-11-21 DIAGNOSIS — I6529 Occlusion and stenosis of unspecified carotid artery: Secondary | ICD-10-CM | POA: Diagnosis not present

## 2023-11-21 DIAGNOSIS — R82998 Other abnormal findings in urine: Secondary | ICD-10-CM | POA: Diagnosis not present

## 2023-11-21 DIAGNOSIS — Z23 Encounter for immunization: Secondary | ICD-10-CM | POA: Diagnosis not present

## 2023-11-24 ENCOUNTER — Encounter: Payer: Self-pay | Admitting: Audiology

## 2023-12-20 ENCOUNTER — Ambulatory Visit: Payer: Medicare Other | Admitting: Hematology and Oncology

## 2023-12-26 DIAGNOSIS — H40013 Open angle with borderline findings, low risk, bilateral: Secondary | ICD-10-CM | POA: Diagnosis not present

## 2023-12-27 ENCOUNTER — Ambulatory Visit (INDEPENDENT_AMBULATORY_CARE_PROVIDER_SITE_OTHER): Payer: Medicare Other

## 2023-12-27 DIAGNOSIS — M5416 Radiculopathy, lumbar region: Secondary | ICD-10-CM | POA: Diagnosis not present

## 2023-12-27 DIAGNOSIS — M1712 Unilateral primary osteoarthritis, left knee: Secondary | ICD-10-CM | POA: Diagnosis not present

## 2023-12-27 DIAGNOSIS — J690 Pneumonitis due to inhalation of food and vomit: Secondary | ICD-10-CM | POA: Diagnosis not present

## 2023-12-27 DIAGNOSIS — M48061 Spinal stenosis, lumbar region without neurogenic claudication: Secondary | ICD-10-CM | POA: Diagnosis not present

## 2023-12-27 DIAGNOSIS — Z96652 Presence of left artificial knee joint: Secondary | ICD-10-CM | POA: Diagnosis not present

## 2023-12-27 DIAGNOSIS — T8484XD Pain due to internal orthopedic prosthetic devices, implants and grafts, subsequent encounter: Secondary | ICD-10-CM | POA: Diagnosis not present

## 2023-12-27 DIAGNOSIS — M180 Bilateral primary osteoarthritis of first carpometacarpal joints: Secondary | ICD-10-CM | POA: Diagnosis not present

## 2023-12-28 ENCOUNTER — Telehealth (INDEPENDENT_AMBULATORY_CARE_PROVIDER_SITE_OTHER): Payer: Self-pay | Admitting: Otolaryngology

## 2023-12-28 NOTE — Telephone Encounter (Signed)
confirmed appt & location [id number] afm

## 2023-12-29 ENCOUNTER — Encounter: Payer: Self-pay | Admitting: Gastroenterology

## 2023-12-29 ENCOUNTER — Ambulatory Visit (INDEPENDENT_AMBULATORY_CARE_PROVIDER_SITE_OTHER): Payer: Medicare Other | Admitting: Otolaryngology

## 2023-12-29 ENCOUNTER — Encounter (INDEPENDENT_AMBULATORY_CARE_PROVIDER_SITE_OTHER): Payer: Self-pay

## 2023-12-29 VITALS — BP 132/83 | HR 75 | Ht 68.0 in | Wt 150.0 lb

## 2023-12-29 DIAGNOSIS — J310 Chronic rhinitis: Secondary | ICD-10-CM | POA: Diagnosis not present

## 2023-12-29 DIAGNOSIS — H6980 Other specified disorders of Eustachian tube, unspecified ear: Secondary | ICD-10-CM | POA: Diagnosis not present

## 2023-12-29 DIAGNOSIS — H6981 Other specified disorders of Eustachian tube, right ear: Secondary | ICD-10-CM

## 2023-12-29 DIAGNOSIS — R0981 Nasal congestion: Secondary | ICD-10-CM

## 2023-12-31 NOTE — Progress Notes (Signed)
Patient ID: [name], female   DOB: Sep 27, 1946, 78 y.o.   MRN: [id number]  Follow-up: Eustachian tube dysfunction, chronic nasal congestion  HPI: The patient is a 78 year old female who returns today for her follow-up evaluation.  The patient was previously seen for her right ear eustachian tube dysfunction and chronic rhinitis.  At her last visit in December 2024, she was noted to have nasal mucosal congestion and bilateral moderate sensorineural hearing loss, with slight right ear conductive hearing loss.  The patient was treated with Flonase nasal spray and Valsalva exercise.  The patient returns today complaining of persistent nasal congestion.  Her right ear symptoms have improved.  She is still having difficulty performing the Valsalva exercise to auto insufflate her middle ear spaces.  Currently she denies any otalgia, otorrhea, facial pain, or fever.  Exam: General: Communicates without difficulty, well nourished, no acute distress. Head: Normocephalic, no evidence injury, no tenderness, facial buttresses intact without stepoff. Face/sinus: No tenderness to palpation and percussion. Facial movement is normal and symmetric. Eyes: PERRL, EOMI. No scleral icterus, conjunctivae clear. Neuro: CN II exam reveals vision grossly intact.  No nystagmus at any point of gaze. Ears: Auricles well formed without lesions.  Ear canals are intact without mass or lesion.  No erythema or edema is appreciated.  The TMs are intact without fluid. Nose: External evaluation reveals normal support and skin without lesions.  Dorsum is intact.  Anterior rhinoscopy reveals congested mucosa over anterior aspect of inferior turbinates and intact septum.  No purulence noted. Oral:  Oral cavity and oropharynx are intact, symmetric, without erythema or edema.  Mucosa is moist without lesions. Neck: Full range of motion without pain.  There is no significant lymphadenopathy.  No masses palpable.  Thyroid bed within normal  limits to palpation.  Parotid glands and submandibular glands equal bilaterally without mass.  Trachea is midline. Neuro:  CN 2-12 grossly intact.   Assessment: 1.  Chronic rhinitis with nasal mucosal congestion. 2.  Persistent eustachian tube dysfunction.  The patient has significant difficulty performing the Valsalva exercise. 3.  No acute infection is noted today.  Plan: 1.  The physical exam findings are reviewed with the patient. 2.  Continue Flonase nasal spray 2 sprays each nostril daily. 3.  In addition, the patient is started on azelastine nasal spray. 4.  Continue with Valsalva exercise multiple times a day. 5.  Allergy referral is offered to the patient.  However she is not interested in seeing the allergy doctor at this time. 6.  The patient will return for reevaluation in 2 months.

## 2024-01-08 ENCOUNTER — Other Ambulatory Visit: Payer: Self-pay | Admitting: General Practice

## 2024-01-09 ENCOUNTER — Ambulatory Visit
Admission: RE | Admit: 2024-01-09 | Discharge: 2024-01-09 | Disposition: A | Discharge status: Home / Self Care | Payer: Medicare Other | Source: Ambulatory Visit | Attending: Adult Health | Admitting: Adult Health

## 2024-01-09 DIAGNOSIS — C50412 Malignant neoplasm of upper-outer quadrant of left female breast: Secondary | ICD-10-CM

## 2024-01-09 DIAGNOSIS — Z853 Personal history of malignant neoplasm of breast: Secondary | ICD-10-CM | POA: Diagnosis not present

## 2024-01-09 DIAGNOSIS — N644 Mastodynia: Secondary | ICD-10-CM | POA: Diagnosis not present

## 2024-01-10 DIAGNOSIS — M4316 Spondylolisthesis, lumbar region: Secondary | ICD-10-CM | POA: Diagnosis not present

## 2024-01-10 DIAGNOSIS — Z6825 Body mass index (BMI) 25.0-25.9, adult: Secondary | ICD-10-CM | POA: Diagnosis not present

## 2024-01-15 NOTE — Assessment & Plan Note (Signed)
03/08/2018: Left Breast:  Grade 1-2 IDC, ER 100% and PR 100%, Ki 67 < 1%, Her 2 Neg Ratio 1.61; Additional Bx: Grade 2 ILC ER/PR Pos Her 2 Neg, Ki 1-5% 04/17/2018: Bil lumpectomies: RT: ALH, Left: Grade 2 ILC, margins cleared with additional surgery Oncotype 14 (ROR 4%) 07/09/2018 - 08/03/2018 : XRT 09/11/2018- June 2020: Tamoxifen (cognitive issues) Genetics Neg 09/26/2019- 11/26/2019: Letrozole )cognitive issues) 12/24/2020- 02/2021: Exemestane discontinued due to AE Unable to tolerate Anastrozole   Breast cancer surveillance: 1.  Breast exam 01/17/2024: Benign 2. Bilateral breast mammogram: 01/09/2024, benign, breast density category D.   3.  I recommended breast MRI alternating with mammogram

## 2024-01-17 ENCOUNTER — Inpatient Hospital Stay: Payer: Medicare Other | Attending: Hematology and Oncology | Admitting: Hematology and Oncology

## 2024-01-17 VITALS — BP 129/70 | HR 92 | Temp 98.1°F | Resp 20 | Ht 68.0 in | Wt 163.4 lb

## 2024-01-17 DIAGNOSIS — C50412 Malignant neoplasm of upper-outer quadrant of left female breast: Secondary | ICD-10-CM | POA: Diagnosis not present

## 2024-01-17 DIAGNOSIS — Z170 Estrogen receptor positive status [ER+]: Secondary | ICD-10-CM | POA: Diagnosis not present

## 2024-01-17 DIAGNOSIS — R413 Other amnesia: Secondary | ICD-10-CM | POA: Insufficient documentation

## 2024-01-17 NOTE — Progress Notes (Signed)
 Patient Care Team: Onita Rush, MD as PCP - General Pietro Redell RAMAN, MD as PCP - Cardiology (Cardiology) Ebbie Cough, MD as Consulting Physician (General Surgery) Izell Domino, MD as Attending Physician (Radiation Oncology) Pietro Redell RAMAN, MD as Consulting Physician (Cardiology) Mat Browning, MD as Consulting Physician (Obstetrics and Gynecology) Deanie Rush ORN, PT as Physical Therapist (Physical Therapy) Georgina Lavelle BROCKS., MD as Referring Physician (Sports Medicine) Rosslyn Room, MD as Physician Assistant (Neurosurgery) Loretha Ash, MD as Consulting Physician (Hematology and Oncology)  DIAGNOSIS:  Encounter Diagnosis  Name Primary?   Malignant neoplasm of upper-outer quadrant of left breast in female, estrogen receptor positive (HCC) Yes    SUMMARY OF ONCOLOGIC HISTORY: Oncology History   No history exists.    CHIEF COMPLIANT: Surveillance of breast cancer  HISTORY OF PRESENT ILLNESS:  History of Present Illness   [name] is a 78 year old female with breast cancer who presents for follow-up regarding her breast imaging and memory concerns.  She is six years post-diagnosis of breast cancer, having undergone surgery in April or May of 2019. She has been diligent with her follow-up mammograms, although her breast density is unusually high. She recalls a previous MRI experience where a biopsy was performed, resulting in significant bruising and bleeding, which was distressing for her.  She stopped anastrozole , after which she noticed an improvement in her memory. However, she has recently experienced a decline in memory again, along with increased hair loss over the past one to two months. She attributes some of her memory issues to being less socially active and not engaging in activities that stimulate her mind. She mentions not being around people a lot, which she feels may affect her memory.  She experiences pain in her left side,  particularly under her arm, which she attributes to scar tissue from her previous breast surgery. This area is very tender and painful, especially when raising her arm.  She has a known brain aneurysm that is monitored regularly at [facility], under the care of Dr. Zomoradi. The procedure involves an IV contrast injection, which causes a sensation of warmth, followed by imaging to assess the aneurysm.         ALLERGIES:  is allergic to oxycontin  [oxycodone  hcl] and codeine.  MEDICATIONS:  Current Outpatient Medications  Medication Sig Dispense Refill   aspirin  81 MG EC tablet Take by mouth.     azelastine  (ASTELIN ) 0.1 % nasal spray Place 1 spray into both nostrils 2 (two) times daily. Use in each nostril as directed     Cholecalciferol (VITAMIN D3) 1000 units CAPS Take 1 capsule by mouth 2 (two) times daily.      diclofenac Sodium (VOLTAREN) 1 % GEL diclofenac 1 % topical gel  apply 2 grams to affected area two to three times a day     esomeprazole  (NEXIUM ) 40 MG capsule Take 40 mg by mouth 2 (two) times daily before a meal.     ezetimibe  (ZETIA ) 10 MG tablet Take 10 mg by mouth at bedtime.     fluticasone  (FLONASE ) 50 MCG/ACT nasal spray Place 1 spray into both nostrils daily.     levothyroxine  (SYNTHROID ) 50 MCG tablet Take by mouth.     linaclotide (LINZESS) 290 MCG CAPS capsule      Magnesium  250 MG TABS      OVER THE COUNTER MEDICATION Apply 1 application  topically daily as needed (pain). Natural pain relief ointment     propranolol  (INDERAL ) 10 MG  tablet TAKE 1 TABLET TWICE A DAY 180 tablet 1   rosuvastatin  (CRESTOR ) 5 MG tablet Take 5 mg by mouth at bedtime.      SUPER B COMPLEX/C PO Take 1 tablet by mouth daily.     tretinoin (RETIN-A) 0.1 % cream      venlafaxine XR (EFFEXOR XR) 75 MG 24 hr capsule Take 75 mg by mouth daily with breakfast.     Vitamins-Lipotropics (COMPLEX B-100-INOSITOL) TBCR Take 1 tablet by mouth daily.     zolpidem  (AMBIEN ) 10 MG tablet zolpidem  10 mg tablet   take 1 tablet by mouth at bedtime if needed     No current facility-administered medications for this visit.    PHYSICAL EXAMINATION: ECOG PERFORMANCE STATUS: 1 - Symptomatic but completely ambulatory  Vitals:   01/17/24 1029  BP: 129/70  Pulse: 92  Resp: 20  Temp: 98.1 F (36.7 C)  SpO2: 99%   Filed Weights   01/17/24 1029  Weight: 163 lb 6.4 oz (74.1 kg)    Physical Exam   BREAST: Left breast tenderness upon palpation, right breast no tenderness. Pain in left breast area post-surgery. Scar tissue present under left arm.      (exam performed in the presence of a chaperone)  LABORATORY DATA:  I have reviewed the data as listed    Latest Ref Rng & Units 02/03/2022    2:19 PM 09/02/2021   12:31 PM 06/02/2021    3:26 PM  CMP  Glucose 70 - 99 mg/dL 97  889  886   BUN 8 - 23 mg/dL 17  15  14    Creatinine 0.44 - 1.00 mg/dL 9.22  9.23  9.21   Sodium 135 - 145 mmol/L 141  141  141   Potassium 3.5 - 5.1 mmol/L 4.2  4.4  4.0   Chloride 98 - 111 mmol/L 107  107  107   CO2 22 - 32 mmol/L 30  26  25    Calcium  8.9 - 10.3 mg/dL 89.7  89.9  9.6   Total Protein 6.5 - 8.1 g/dL 6.4  6.6  6.5   Total Bilirubin 0.3 - 1.2 mg/dL 0.6  0.6  0.5   Alkaline Phos 38 - 126 U/L 90  101  86   AST 15 - 41 U/L 57  39  39   ALT 0 - 44 U/L 49  31  32     Lab Results  Component Value Date   WBC 7.7 02/03/2022   HGB 12.9 02/03/2022   HCT 39.7 02/03/2022   MCV 91.5 02/03/2022   PLT 202 02/03/2022   NEUTROABS 3.4 02/03/2022    ASSESSMENT & PLAN:  Malignant neoplasm of upper-outer quadrant of left breast in female, estrogen receptor positive (HCC) 03/08/2018: Left Breast:  Grade 1-2 IDC, ER 100% and PR 100%, Ki 67 < 1%, Her 2 Neg Ratio 1.61; Additional Bx: Grade 2 ILC ER/PR Pos Her 2 Neg, Ki 1-5% 04/17/2018: Bil lumpectomies: RT: ALH, Left: Grade 2 ILC, margins cleared with additional surgery Oncotype 14 (ROR 4%) 07/09/2018 - 08/03/2018 : XRT 09/11/2018- June 2020: Tamoxifen  (cognitive  issues) Genetics Neg 09/26/2019- 11/26/2019: Letrozole  )cognitive issues) 12/24/2020- 02/2021: Exemestane  discontinued due to AE Unable to tolerate Anastrozole    Breast cancer surveillance: 1.  Breast exam 01/17/2024: Benign 2. Bilateral breast mammogram: 01/09/2024, benign, breast density category D.   3.  I recommended contrast-enhanced mammograms from next year onwards ------------------------------------- Assessment and Plan     Memory Loss Reports worsening memory issues,  which improved after stopping anastrozole  but have since worsened. Discussed potential causes including natural aging and lack of social stimulation. Recommended cognitive exercises, physical activity, and social engagement. Mentioned clinical trial using computerized games for memory improvement, though not eligible due to anastrozole  not being considered chemotherapy. - Engage in daily puzzles or cognitive games. - Increase physical exercise. - Consider joining social activities such as book clubs or group classes.  Brain Aneurysm Known brain aneurysm monitored by neurology at [facility]. Undergoes regular imaging with contrast. Managed by Dr. Zomoradi at [facility]. - Continue regular follow-ups with neurology at [facility].  Hair Loss Reports hair loss, possibly seasonal due to dry winter conditions. - Monitor hair loss and consider seasonal factors.  General Health Maintenance Discussed importance of maintaining cognitive and physical health through regular activities and social engagement. - Encourage reading, physical exercise, and social activities.  Follow-up - Schedule CEM for next year. - Continue regular follow-ups with neurology at [facility].          Orders Placed This Encounter  Procedures   MM 2D DIAG BILAT WITH CONTRAST BCG ONLY    Standing Status:   Future    Expected Date:   01/09/2025    Expiration Date:   01/16/2025    Reason for Exam (SYMPTOM  OR DIAGNOSIS REQUIRED):   High density breast    If indicated  for the ordered procedure, I authorize the administration of contrast media per Radiology protocol:   Yes    Does the patient have a contrast media/X-ray dye allergy?:   No    Preferred [facility]    Release to patient:   Immediate   The patient has a good understanding of the overall plan. she agrees with it. she will call with any problems that may develop before the next visit here. Total time spent: 30 mins including face to face time and time spent for planning, charting and co-ordination of care   Viinay K Jasmin Winberry, MD 01/17/24

## 2024-01-18 ENCOUNTER — Encounter: Payer: Self-pay | Admitting: Gastroenterology

## 2024-01-18 ENCOUNTER — Ambulatory Visit: Payer: Medicare Other | Admitting: Gastroenterology

## 2024-01-18 VITALS — BP 118/72 | HR 93 | Ht 68.0 in | Wt 164.5 lb

## 2024-01-18 DIAGNOSIS — R195 Other fecal abnormalities: Secondary | ICD-10-CM

## 2024-01-18 DIAGNOSIS — K5909 Other constipation: Secondary | ICD-10-CM | POA: Diagnosis not present

## 2024-01-18 DIAGNOSIS — K449 Diaphragmatic hernia without obstruction or gangrene: Secondary | ICD-10-CM

## 2024-01-18 DIAGNOSIS — R7989 Other specified abnormal findings of blood chemistry: Secondary | ICD-10-CM | POA: Diagnosis not present

## 2024-01-18 DIAGNOSIS — Z1211 Encounter for screening for malignant neoplasm of colon: Secondary | ICD-10-CM

## 2024-01-18 NOTE — Progress Notes (Signed)
 Chief Complaint: Positive Hemosure Primary GI MD: Unassigned  HPI: 78 year old female history of breast cancer s/p surgery 2019, brain aneurysm followed at [facility], aspiration pneumonia following a colonoscopy, presents for evaluation of positive heme assure  Patient is a previous patient of Dr. Obie last seen in 2014 and then appears she transferred care to Dr. Donnald.  We do not have these records available.  Patient's last colonoscopy was in 2014 with Dr. Obie.  At that time she questionably aspirated during colonoscopy leading to aspiration pneumonia which was treated on a 10-day course of Levaquin   Labs 02/03/2022 Normal CBC AST 57/ALT 49/alk phos 90/T. bili 0.6  Reportedly positive Hemosure with PCP.  We do not have these records available.  Patient reports she is extremely hesitant about pursuing another colonoscopy.  She states her experience with aspiration pneumonia post colonoscopy in 2014 was very traumatic for her and she does not want to go through this again.  She states when she completed her stool studies she was straining in order to get the stool study produced and she feels it may have skewed the results.  She does not see blood.  Does not have a change in bowel habits, weight loss, nausea, vomiting.  She does report chronic constipation for which she takes Linzess 290 mcg.  Initially this worked really well for her and now she feels it does not work as well.  Some days she can go 3 days without a bowel movement.  She also notes Dr. Donnald diagnosed her with a very large hiatal hernia and she feels this is going to put her at higher risk for aspiration.  We do not have these records but I can see that she had a CT abdomen pelvis with contrast in 2022 which showed a moderate-sized hiatal hernia.  She does not have GERD.  She takes Nexium  daily with adequate control of GERD.  She requests to see Dr. Abran  PREVIOUS GI WORKUP   Colonoscopy 12/2012 with Dr. Karyle -  Normal colon - Repeat 10 years  Past Medical History:  Diagnosis Date   Arthritis    hands and knees   Complication of anesthesia    aspiration pna; following a colonoscopy, pt requests head of bed elevated if possible.   Constipation    Cough    Depression    Dysrhythmia    RBBB on 06-06-17 ekg    Elevated liver function tests    Esophageal stricture    Family history of breast cancer    Gallstones    Genetic testing 04/02/2018   STAT Breast panel with reflex to Multi-Cancer panel (83 genes) @ [facility] - No pathogenic mutations detected   GERD (gastroesophageal reflux disease)    Hiatal hernia    History of kidney stones    History of radiation therapy 07/09/2018- 08/03/18   Left Breast/ 40.05 Gy in 15 fractions. Left Breast boost 10 Gy in 5 fractions.    Hyperlipemia    Hypothyroidism    Insomnia    Malignant neoplasm of upper-outer quadrant of left female breast [facility])    Nephrolithiasis    Personal history of radiation therapy    Pneumonia 2015   aspirated after colonosopy   Renal cyst     Past Surgical History:  Procedure Laterality Date   BREAST BIOPSY Left 05/31/2019   mri   BREAST BIOPSY Left 04/2019   malignant   BREAST LUMPECTOMY Left 04/17/2018   malignant   BREAST LUMPECTOMY WITH RADIOACTIVE SEED  AND SENTINEL LYMPH NODE BIOPSY Left 04/17/2018   Procedure: LEFT BREAST LUMPECTOMY WITH BRACKETED RADIOACTIVE SEEDS AND SENTINEL LYMPH NODE BIOPSY;  Surgeon: Ebbie Cough, MD;  Location: [facility];  Service: General;  Laterality: Left;   BREAST SURGERY Left    Lumpectomy   CARDIOVASCULAR STRESS TEST  07/19/2006   EF 70%, NO EVIDENCE OF ISCHEMIA   CHOLECYSTECTOMY  12/30/10   COLONOSCOPY     DILATION AND CURETTAGE OF UTERUS     HYSTEROSCOPY WITH D & C N/A 06/07/2018   Procedure: DILATATION AND CURETTAGE /HYSTEROSCOPY;  Surgeon: Mat Browning, MD;  Location: [facility];  Service: Gynecology;  Laterality: N/A;   inguinal herniography     NASAL SINUS  SURGERY     RADIOACTIVE SEED GUIDED EXCISIONAL BREAST BIOPSY Right 04/17/2018   Procedure: RIGHT BREAST SEED GUIDED EXCISIONAL BIOPSY;  Surgeon: Ebbie Cough, MD;  Location: [facility];  Service: General;  Laterality: Right;   RE-EXCISION OF BREAST LUMPECTOMY Left 05/15/2018   Procedure: RE-EXCISION OF LEFT BREAST LUMPECTOMY, ASPIRATION OF LEFT AXILLARY SEROMA;  Surgeon: Ebbie Cough, MD;  Location: [facility];  Service: General;  Laterality: Left;   T & A     TOOTH EXTRACTION     TOTAL KNEE ARTHROPLASTY Left 09/11/2017   Procedure: LEFT TOTAL KNEE ARTHROPLASTY;  Surgeon: Melodi Lerner, MD;  Location: [facility];  Service: Orthopedics;  Laterality: Left;  with block   US  ECHOCARDIOGRAPHY  07/17/2006   EF 55-60%    Current Outpatient Medications  Medication Sig Dispense Refill   aspirin  81 MG EC tablet Take by mouth.     azelastine  (ASTELIN ) 0.1 % nasal spray Place 1 spray into both nostrils 2 (two) times daily. Use in each nostril as directed     Cholecalciferol (VITAMIN D3) 1000 units CAPS Take 1 capsule by mouth 2 (two) times daily.      diclofenac Sodium (VOLTAREN) 1 % GEL diclofenac 1 % topical gel  apply 2 grams to affected area two to three times a day     esomeprazole  (NEXIUM ) 40 MG capsule Take 40 mg by mouth 2 (two) times daily before a meal.     ezetimibe  (ZETIA ) 10 MG tablet Take 10 mg by mouth at bedtime.     fluticasone  (FLONASE ) 50 MCG/ACT nasal spray Place 1 spray into both nostrils daily.     levothyroxine  (SYNTHROID ) 50 MCG tablet Take by mouth.     linaclotide (LINZESS) 290 MCG CAPS capsule      Magnesium  250 MG TABS      OVER THE COUNTER MEDICATION Apply 1 application  topically daily as needed (pain). Natural pain relief ointment     propranolol  (INDERAL ) 10 MG tablet TAKE 1 TABLET TWICE A DAY 180 tablet 1   rosuvastatin  (CRESTOR ) 5 MG tablet Take 5 mg by mouth at bedtime.      SUPER B COMPLEX/C PO Take 1 tablet by mouth daily.      tretinoin (RETIN-A) 0.1 % cream      venlafaxine XR (EFFEXOR XR) 75 MG 24 hr capsule Take 75 mg by mouth daily with breakfast.     Vitamins-Lipotropics (COMPLEX B-100-INOSITOL) TBCR Take 1 tablet by mouth daily.     zolpidem  (AMBIEN ) 10 MG tablet zolpidem  10 mg tablet  take 1 tablet by mouth at bedtime if needed     No current facility-administered medications for this visit.    Allergies as of 01/18/2024 - Review Complete 01/18/2024  Allergen Reaction Noted  Oxycontin  [oxycodone  hcl]  09/04/2017   Codeine Nausea And Vomiting     Family History  Problem Relation Age of Onset   Breast cancer Mother 23       deceased 28; TAH/BSO in 62s/50s   Dementia Father        deceased 58   Breast cancer Sister    Breast cancer Maternal Aunt 69       deceased 70s   Colon cancer Neg Hx    Stomach cancer Neg Hx    Esophageal cancer Neg Hx    Liver cancer Neg Hx     Social History   Socioeconomic History   Marital status: Married    Spouse name: Not on file   Number of children: 2   Years of education: Not on file   Highest education level: Not on file  Occupational History   Occupation: Retired    Associate Professor: [employer]  Tobacco Use   Smoking status: Never   Smokeless tobacco: Never  Vaping Use   Vaping status: Never Used  Substance and Sexual Activity   Alcohol  use: No   Drug use: No   Sexual activity: Not on file  Other Topics Concern   Not on file  Social History Narrative   Not on file   Social Drivers of Health   Financial Resource Strain: Low Risk  (05/22/2023)   Received from [facility], [facility]   Overall Financial Resource Strain (CARDIA)    Difficulty of Paying Living Expenses: Not hard at all  Food Insecurity: No Food Insecurity (05/22/2023)   Received from [facility], [facility]   Hunger Vital Sign    Worried About Running Out of Food in the Last Year: Never true    Ran Out of Food in the Last Year: Never true  Transportation Needs: No  Transportation Needs (05/22/2023)   Received from [employer], [facility]   PRAPARE - Transportation    Lack of Transportation (Medical): No    Lack of Transportation (Non-Medical): No  Physical Activity: Not on file  Stress: Not on file  Social Connections: Unknown (04/25/2022)   Received from [facility], [facility]   Social Network    Social Network: Not on file  Intimate Partner Violence: Unknown (03/17/2022)   Received from [facility], [facility]   HITS    Physically Hurt: Not on file    Insult or Talk Down To: Not on file    Threaten Physical Harm: Not on file    Scream or Curse: Not on file    Review of Systems:    Constitutional: No weight loss, fever, chills, weakness or fatigue HEENT: Eyes: No change in vision               Ears, Nose, Throat:  No change in hearing or congestion Skin: No rash or itching Cardiovascular: No chest pain, chest pressure or palpitations   Respiratory: No SOB or cough Gastrointestinal: See HPI and otherwise negative Genitourinary: No dysuria or change in urinary frequency Neurological: No headache, dizziness or syncope Musculoskeletal: No new muscle or joint pain Hematologic: No bleeding or bruising Psychiatric: No history of depression or anxiety    Physical Exam:  Vital signs: BP 118/72 (BP Location: Left Arm, Patient Position: Sitting, Cuff Size: Normal)   Pulse 93   Ht 5' 8 (1.727 m)   Wt 164 lb 8 oz (74.6 kg)   BMI 25.01 kg/m   Constitutional: NAD, Well developed, Well nourished, alert and cooperative.  Appears younger than stated age Head:  Normocephalic and atraumatic. Eyes:   PEERL, EOMI. No icterus. Conjunctiva pink. Respiratory: Respirations even and unlabored. Lungs clear to auscultation bilaterally.   No wheezes, crackles, or rhonchi.  Cardiovascular:  Regular rate and rhythm. No peripheral edema, cyanosis or pallor.  Gastrointestinal:  Soft, nondistended, nontender. No rebound or guarding. Normal bowel  sounds. No appreciable masses or hepatomegaly. Rectal:  Not performed.  Msk:  Symmetrical without gross deformities. Without edema, no deformity or joint abnormality.  Neurologic:  Alert and  oriented x4;  grossly normal neurologically.  Skin:   Dry and intact without significant lesions or rashes. Psychiatric: Oriented to person, place and time. Demonstrates good judgement and reason without abnormal affect or behaviors.   RELEVANT LABS AND IMAGING: CBC    Component Value Date/Time   WBC 7.7 02/03/2022 1419   RBC 4.34 02/03/2022 1419   HGB 12.9 02/03/2022 1419   HGB 12.9 11/26/2019 1024   HGB 13.4 06/06/2017 1448   HCT 39.7 02/03/2022 1419   HCT 42.4 06/06/2017 1448   PLT 202 02/03/2022 1419   PLT 190 11/26/2019 1024   PLT 298 06/06/2017 1448   MCV 91.5 02/03/2022 1419   MCV 92 06/06/2017 1448   MCH 29.7 02/03/2022 1419   MCHC 32.5 02/03/2022 1419   RDW 13.7 02/03/2022 1419   RDW 14.5 06/06/2017 1448   LYMPHSABS 3.1 02/03/2022 1419   MONOABS 0.7 02/03/2022 1419   EOSABS 0.5 02/03/2022 1419   BASOSABS 0.1 02/03/2022 1419    CMP     Component Value Date/Time   NA 141 02/03/2022 1419   NA 144 06/06/2017 1448   K 4.2 02/03/2022 1419   CL 107 02/03/2022 1419   CO2 30 02/03/2022 1419   GLUCOSE 97 02/03/2022 1419   BUN 17 02/03/2022 1419   BUN 12 06/06/2017 1448   CREATININE 0.77 02/03/2022 1419   CREATININE 0.80 11/26/2019 1024   CALCIUM  10.2 02/03/2022 1419   PROT 6.4 (L) 02/03/2022 1419   PROT 6.3 06/06/2017 1448   ALBUMIN 4.4 02/03/2022 1419   ALBUMIN 4.4 06/06/2017 1448   AST 57 (H) 02/03/2022 1419   AST 45 (H) 11/26/2019 1024   ALT 49 (H) 02/03/2022 1419   ALT 31 11/26/2019 1024   ALKPHOS 90 02/03/2022 1419   BILITOT 0.6 02/03/2022 1419   BILITOT 0.7 11/26/2019 1024   GFRNONAA >60 02/03/2022 1419   GFRNONAA >60 11/26/2019 1024   GFRAA >60 06/29/2020 1237   GFRAA >60 11/26/2019 1024     Assessment/Plan:   Positive hemosure Normal colonoscopy in 2014  with reported aspiration post colonoscopy and this makes patient extremely hesitant.  Very extensive and lengthy conversation with patient about various colon cancer screenings and recommendation to pursue colonoscopy with a positive hemosure to rule out polyps and colon cancer.  Patient would like to go home and think about this and let us  know.  She also would like to repeat stool study.  Advised patient that if repeat stool study is negative it could be a false negative and she acknowledges and verbalizes understanding. - Repeat FOB per patient request - Colonoscopy decision per patient TBD after conversation with her husband.  She would be an adequate [facility] candidate. she does request Dr. Abran. - Ample time was given for questions to be answered  Chronic constipation Previously well-controlled on Linzess 290 mcg now she feels this is less effective.  Offered multiple regimen changes for her chronic constipation including changing to Amitiza   24 mcg versus Trulance versus adding on MiraLAX .  She states she will go home and think about this and do some research and let me know. - Linzess 290 mcg plus MiraLAX  as needed, or consider switching to Amitiza  24 mcg, patient decision TBD - Increase water , increase exercise, increase fiber  Elevated LFTs Mildly elevated LFTs and hepatocellular pattern (AST/ALT) February 2023.  We do not have further labs but she states she just recently got labs with Dr. Monia office and reports they were normal but she has been diagnosed with fatty liver in the past.  She declines blood work today.  CT from 2022 with normal liver. - Obtain previous records from Dr. Onita  Hiatal hernia History of hiatal hernia, moderate size noted on CT.  Patient is concerned as she has been told by Dr. Donnald that she has a very large hiatal hernia and she would like her records obtained from the imaging study that he had gotten done which indicates different results from CT in 2022.  No  previous EGD.  GERD well-controlled on daily Nexium  - Obtain previous records from [place]   This visit required 50 minutes of patient care (this includes precharting, chart review, review of results, face-to-face time used for counseling as well as treatment plan and follow-up. The patient was provided an opportunity to ask questions and all were answered. The patient agreed with the plan and demonstrated an understanding of the instructions.   Nestor Mollie RIGGERS [facility] 01/18/2024, 3:13 PM  Cc: Onita Rush, MD

## 2024-01-18 NOTE — Patient Instructions (Addendum)
 Your provider has requested that you go to the basement level for lab work before leaving today. Press B on the elevator. The lab is located at the first door on the left as you exit the elevator.  Your last colonoscopy was in 2014 _______________________________________________________  If your blood pressure at your visit was 140/90 or greater, please contact your primary care physician to follow up on this.  _______________________________________________________  If you are age 70 or older, your body mass index should be between 23-30. Your Body mass index is 25.01 kg/m. If this is out of the aforementioned range listed, please consider follow up with your Primary Care Provider.  If you are age 32 or younger, your body mass index should be between 19-25. Your Body mass index is 25.01 kg/m. If this is out of the aformentioned range listed, please consider follow up with your Primary Care Provider.   ________________________________________________________  The [facility] providers would like to encourage you to use MYCHART to communicate with providers for non-urgent requests or questions.  Due to long hold times on the telephone, sending your provider a message by [facility] may be a faster and more efficient way to get a response.  Please allow 48 business hours for a response.  Please remember that this is for non-urgent requests.  _______________________________________________________ It was a pleasure to see you today!  Thank you for trusting me with your gastrointestinal care!

## 2024-01-19 NOTE — Progress Notes (Signed)
 Noted. Please obtain [place] records

## 2024-01-22 DIAGNOSIS — L853 Xerosis cutis: Secondary | ICD-10-CM | POA: Diagnosis not present

## 2024-01-22 DIAGNOSIS — D1801 Hemangioma of skin and subcutaneous tissue: Secondary | ICD-10-CM | POA: Diagnosis not present

## 2024-01-22 DIAGNOSIS — L821 Other seborrheic keratosis: Secondary | ICD-10-CM | POA: Diagnosis not present

## 2024-01-22 DIAGNOSIS — D485 Neoplasm of uncertain behavior of skin: Secondary | ICD-10-CM | POA: Diagnosis not present

## 2024-01-22 DIAGNOSIS — L814 Other melanin hyperpigmentation: Secondary | ICD-10-CM | POA: Diagnosis not present

## 2024-01-22 DIAGNOSIS — Z85828 Personal history of other malignant neoplasm of skin: Secondary | ICD-10-CM | POA: Diagnosis not present

## 2024-01-22 DIAGNOSIS — L570 Actinic keratosis: Secondary | ICD-10-CM | POA: Diagnosis not present

## 2024-01-22 DIAGNOSIS — L820 Inflamed seborrheic keratosis: Secondary | ICD-10-CM | POA: Diagnosis not present

## 2024-02-11 NOTE — Progress Notes (Deleted)
 Cardiology Clinic Note   Patient Name: [name] Date of Encounter: 02/11/2024  Primary Care Provider:  Creola Corn, MD Primary Cardiologist:  Olga Millers, MD  Patient Profile    [name] 78 year old female presents to the clinic today for follow-up evaluation of her hyperlipidemia and dizziness.  Past Medical History    Past Medical History:  Diagnosis Date   Arthritis    hands and knees   Complication of anesthesia    aspiration pna; following a colonoscopy, pt requests head of bed elevated if possible.   Constipation    Cough    Depression    Dysrhythmia    RBBB on 06-06-17 ekg    Elevated liver function tests    Esophageal stricture    Family history of breast cancer    Gallstones    Genetic testing 04/02/2018   STAT Breast panel with reflex to Multi-Cancer panel (83 genes) @ [facility] - No pathogenic mutations detected   GERD (gastroesophageal reflux disease)    Hiatal hernia    History of kidney stones    History of radiation therapy 07/09/2018- 08/03/18   Left Breast/ 40.05 Gy in 15 fractions. Left Breast boost 10 Gy in 5 fractions.    Hyperlipemia    Hypothyroidism    Insomnia    Malignant neoplasm of upper-outer quadrant of left female breast (HCC)    Nephrolithiasis    Personal history of radiation therapy    Pneumonia 2015   aspirated after colonosopy   Renal cyst    Past Surgical History:  Procedure Laterality Date   BREAST BIOPSY Left 05/31/2019   mri   BREAST BIOPSY Left 04/2019   malignant   BREAST LUMPECTOMY Left 04/17/2018   malignant   BREAST LUMPECTOMY WITH RADIOACTIVE SEED AND SENTINEL LYMPH NODE BIOPSY Left 04/17/2018   Procedure: LEFT BREAST LUMPECTOMY WITH BRACKETED RADIOACTIVE SEEDS AND SENTINEL LYMPH NODE BIOPSY;  Surgeon: Emelia Loron, MD;  Location: [facility];  Service: General;  Laterality: Left;   BREAST SURGERY Left    Lumpectomy   CARDIOVASCULAR STRESS TEST  07/19/2006   EF 70%, NO EVIDENCE OF  ISCHEMIA   CHOLECYSTECTOMY  12/30/10   COLONOSCOPY     DILATION AND CURETTAGE OF UTERUS     HYSTEROSCOPY WITH D & C N/A 06/07/2018   Procedure: DILATATION AND CURETTAGE /HYSTEROSCOPY;  Surgeon: Marcelle Overlie, MD;  Location: [facility];  Service: Gynecology;  Laterality: N/A;   inguinal herniography     NASAL SINUS SURGERY     RADIOACTIVE SEED GUIDED EXCISIONAL BREAST BIOPSY Right 04/17/2018   Procedure: RIGHT BREAST SEED GUIDED EXCISIONAL BIOPSY;  Surgeon: Emelia Loron, MD;  Location: [facility];  Service: General;  Laterality: Right;   RE-EXCISION OF BREAST LUMPECTOMY Left 05/15/2018   Procedure: RE-EXCISION OF LEFT BREAST LUMPECTOMY, ASPIRATION OF LEFT AXILLARY SEROMA;  Surgeon: Emelia Loron, MD;  Location: [facility];  Service: General;  Laterality: Left;   T & A     TOOTH EXTRACTION     TOTAL KNEE ARTHROPLASTY Left 09/11/2017   Procedure: LEFT TOTAL KNEE ARTHROPLASTY;  Surgeon: Ollen Gross, MD;  Location: [facility];  Service: Orthopedics;  Laterality: Left;  with block   US ECHOCARDIOGRAPHY  07/17/2006   EF 55-60%    Allergies  Allergies  Allergen Reactions   Oxycontin [Oxycodone Hcl]     "it made me feel very high and hyper"    Codeine Nausea And Vomiting    History of  Present Illness    [name] has a PMH of chest pain, nonspecific EKG changes, dizziness, breast CA, and hyperlipidemia.  Her echocardiogram 12/17 showed normal LV function and G1 DD.  She was noted to have orthostatic symptoms during previous cardiology visits.  Her nuclear stress test 6/18 showed ejection fraction of 64% and no ischemia.  Her carotid Doppler 7/18 showed 1-39% left carotid stenosis.  She was seen by Dr. Jens Som 08/04/2020.  During that time she denied dyspnea, chest pain, palpitations, and syncope.  She did note occasional dizziness with standing.   She presented to the clinic 04/04/22 for follow-up evaluation and stated she was limited in her mobility and  has not been as physically active due to sciatica in her right leg.  She continued to have cancer treatment.  She had recently started taking her anastrozole 3 days/week instead of 7.  She felt that her memory had improved.  She denied symptoms of heart failure.  We reviewed her previous stress testing.  Both she and her husband expressed understanding.  She did have an episode of global amnesia in November last year.  The episode was at [facility].  We reviewed her CT report.  I asked her to increase her physical activity as tolerated, continue her current medication regimen, and continue heart healthy low-sodium diet.  Follow-up for 12 months was planned.  She presented with clinic  03/15/23 for follow-up evaluation and stated she had a back injection for sciatica approximately 1 month prior.  Since that time she had noticed elevated heart rate.  She reported that when she was younger, in her 50s, she was on medication for tachycardia.   EKG  showed normal sinus rhythm 94 bpm.  We reviewed options for treatment.  I  asked her to maintain her p.o. hydration, avoid caffeine, avoid too much chocolate and other triggers for palpitations.  She reported that she had stopped anastrozole and  had some improvement in her memory.  I  prescribed propranolol 10 mg twice daily x 1 month and planned follow-up in 1 month.  She presented to the clinic 06/14/23 for follow-up evaluation and stated she continued to have pain related to her sciatica.  She noticed this on her left side.  Her blood pressure was slightly elevated  at 142/80.  She reported she was getting ready to see a neurosurgeon to talk about options for pain control.  She had been drinking about 36 to 46 ounces daily.  She reported that she felt better with her propranolol.  I refilled this for 90 days.  She did take Advil daily for her sciatica pain.  I planned follow-up in 9 months.  She presents to the clinic today for follow-up evaluation and states***.   Today  she denies chest pain, shortness of breath, lower extremity edema, fatigue, palpitations, melena, hematuria, hemoptysis, diaphoresis, weakness, presyncope, syncope, orthopnea, and PND.   Elevated heart rate-heart rate today 4***9.   Reviewed importance of avoiding  caffeine, chocolate, EtOH, dehydration etc. Continue propranolol 10 mg twice daily Maintain p.o. hydration-goal 50 ounces or more daily  Orthostasis-denies recent episodes.  Continue to change positions slowly and maintain p.o. hydration. Maintain p.o. hydration Maintain sodium in diet Change positions slowly May use lower extremity support stockings-Lavina support stocking sheet given.  History of chest discomfort-denies recent anginal type symptoms.  History of negative stress test with normal LV function.   Continue aspirin Continue to monitor.  No plans for ischemic evaluation.   Hyperlipidemia-LDL 74***  Continue aspirin, rosuvastatin High-fiber diet Follows with PCP   Disposition: Follow-up with Dr. Jens Som or me in 6-9 months .  Home Medications    Prior to Admission medications   Medication Sig Start Date End Date Taking? Authorizing Provider  anastrozole (ARIMIDEX) 1 MG tablet Take 1 mg by mouth daily. She takes it 3 days a week    [provider]  aspirin 81 MG EC tablet Take by mouth.    [provider]  azelastine (ASTELIN) 0.1 % nasal spray Place 1 spray into both nostrils 2 (two) times daily. Use in each nostril as directed    [provider]  buPROPion (WELLBUTRIN XL) 150 MG 24 hr tablet Take by mouth.    [provider]  Cholecalciferol (VITAMIN D3) 1000 units CAPS Take 1 capsule by mouth 2 (two) times daily.     [provider]  cycloSPORINE (RESTASIS) 0.05 % ophthalmic emulsion Apply to eye.    [provider]  cycloSPORINE, PF, (CEQUA) 0.09 % SOLN  08/26/20   [provider]  diazepam (VALIUM) 5 MG tablet Take one tablet po one hour prior  to procedure. Must have a driver. 12/15/21   Kirsteins, Victorino Sparrow, MD  diclofenac Sodium (VOLTAREN) 1 % GEL diclofenac 1 % topical gel  apply 2 grams to affected area two to three times a day    [provider]  escitalopram (LEXAPRO) 10 MG tablet Take by mouth.    [provider]  esomeprazole (NEXIUM) 40 MG capsule Take 40 mg by mouth 2 (two) times daily before a meal.    [provider]  ezetimibe (ZETIA) 10 MG tablet Take 10 mg by mouth at bedtime.    [provider]  fluticasone (FLONASE) 50 MCG/ACT nasal spray Place 1 spray into both nostrils daily.    [provider]  Fluticasone Propionate, Inhal, 50 MCG/ACT AEPB Inhale into the lungs.    [provider]  levothyroxine (SYNTHROID, LEVOTHROID) 50 MCG tablet Take 50 mcg by mouth at bedtime.    [provider]  linaclotide Karlene Einstein) 290 MCG CAPS capsule  11/24/20   [provider]  Magnesium 250 MG TABS     [provider]  naproxen sodium (ALEVE) 220 MG tablet Take 220 mg by mouth 2 (two) times daily as needed (pain).    [provider]  ondansetron (ZOFRAN-ODT) 8 MG disintegrating tablet  01/25/21   [provider]  OVER THE COUNTER MEDICATION Apply 1 application topically daily as needed (pain). Natural pain relief ointment    [provider]  predniSONE (STERAPRED UNI-PAK 21 TAB) 10 MG (21) TBPK tablet  07/21/20   [provider]  Probiotic Product (PROBIOTIC ADVANCED PO) Take 1 tablet by mouth daily.    [provider]  rosuvastatin (CRESTOR) 5 MG tablet Take 5 mg by mouth at bedtime.     [provider]  sodium chloride (OCEAN) 0.65 % SOLN nasal spray Place 4 sprays into both nostrils 4 (four) times daily.    [provider]  SUPER B COMPLEX/C PO Take 1 tablet by mouth daily.    [provider]  traMADol Janean Sark) 50 MG tablet  01/25/21   [provider]  tretinoin (RETIN-A) 0.1 %  cream  03/02/21   [provider]  UNABLE TO FIND Med Name: CBD Cream    [provider]  zolpidem (AMBIEN) 10 MG tablet zolpidem 10 mg tablet  take 1 tablet by mouth at bedtime  if needed    [provider]    Family History    Family History  Problem Relation Age of Onset   Breast cancer Mother 40       deceased 82; TAH/BSO in 31s/50s   Dementia Father        deceased 66   Breast cancer Sister    Breast cancer Maternal Aunt 86       deceased 22s   Colon cancer Neg Hx    Stomach cancer Neg Hx    Esophageal cancer Neg Hx    Liver cancer Neg Hx    She indicated that her mother is deceased. She indicated that her father is deceased. She indicated that her sister is alive. She indicated that her brother is alive. She indicated that her maternal grandmother is deceased. She indicated that her maternal grandfather is deceased. She indicated that her paternal grandmother is deceased. She indicated that her paternal grandfather is deceased. She indicated that her maternal aunt is deceased. She indicated that the status of her neg hx is unknown.  Social History    Social History   Socioeconomic History   Marital status: Married    Spouse name: Not on file   Number of children: 2   Years of education: Not on file   Highest education level: Not on file  Occupational History   Occupation: Retired    Associate Professor: [employer]  Tobacco Use   Smoking status: Never   Smokeless tobacco: Never  Vaping Use   Vaping status: Never Used  Substance and Sexual Activity   Alcohol use: No   Drug use: No   Sexual activity: Not on file  Other Topics Concern   Not on file  Social History Narrative   Not on file   Social Drivers of Health   Financial Resource Strain: Low Risk  (05/22/2023)   Received from [facility], [facility]   Overall Financial Resource Strain (CARDIA)    Difficulty of Paying Living Expenses: Not hard at all  Food Insecurity: No Food Insecurity  (05/22/2023)   Received from [facility], [facility]   Hunger Vital Sign    Worried About Running Out of Food in the Last Year: Never true    Ran Out of Food in the Last Year: Never true  Transportation Needs: No Transportation Needs (05/22/2023)   Received from [employer], [facility]   PRAPARE - Transportation    Lack of Transportation (Medical): No    Lack of Transportation (Non-Medical): No  Physical Activity: Not on file  Stress: Not on file  Social Connections: Unknown (04/25/2022)   Received from [facility], [facility]   Social Network    Social Network: Not on file  Intimate Partner Violence: Unknown (03/17/2022)   Received from [facility], [facility]   HITS    Physically Hurt: Not on file    Insult or Talk Down To: Not on file    Threaten Physical Harm: Not on file    Scream or Curse: Not on file     Review of Systems    General:  No chills, fever, night sweats or weight changes.  Cardiovascular:  No chest pain, dyspnea on exertion, edema, orthopnea, palpitations, paroxysmal nocturnal dyspnea. Dermatological: No rash, lesions/masses Respiratory: No cough, dyspnea Urologic: No hematuria, dysuria Abdominal:   No nausea, vomiting, diarrhea, bright red blood per rectum, melena, or hematemesis Neurologic:  No visual changes, wkns, changes in mental status. All other systems reviewed and are  otherwise negative except as noted above.  Physical Exam    VS:  There were no vitals taken for this visit. , BMI There is no height or weight on file to calculate BMI. GEN: Well nourished, well developed, in no acute distress. HEENT: normal. Neck: Supple, no JVD, carotid bruits, or masses. Cardiac: RRR, no murmurs, rubs, or gallops. No clubbing, cyanosis, edema.  Radials/DP/PT 2+ and equal bilaterally.  Respiratory:  Respirations regular and unlabored, clear to auscultation bilaterally. GI: Soft, nontender, nondistended, BS + x 4. MS: no deformity or  atrophy. Skin: warm and dry, no rash. Neuro:  Strength and sensation are intact. Psych: Normal affect.  Accessory Clinical Findings    Recent Labs: No results found for requested labs within last 365 days.   Recent Lipid Panel    Component Value Date/Time   CHOL 133 08/08/2011 0913   TRIG 151.0 (H) 08/08/2011 0913   HDL 43.10 08/08/2011 0913   CHOLHDL 3 08/08/2011 0913   VLDL 30.2 08/08/2011 0913   LDLCALC 60 08/08/2011 0913    ECG personally reviewed by me today-none today***.  EKG 03/15/2023 normal sinus rhythm possible right ventricular hypertrophy 94 bpm  EKG 04/04/2022 normal sinus rhythm incomplete right bundle branch block possible right ventricular hypertrophy 74 bpm- No acute changes  EKG 08/04/2020 Sinus rhythm 100 bpm, incomplete RBBB, inferior lateral infarct, no acute changes from 08/2019  Assessment & Plan   1.  ***    Thomasene Ripple. Roshanda Balazs NP-C    02/11/2024, 12:26 PM [facility] [address] Office [phone] Fax [phone]  Notice: This dictation was prepared with Dragon dictation along with smaller phrase technology. Any transcriptional errors that result from this process are unintentional and may not be corrected upon review.  I spent 13 ***minutes examining this patient, reviewing medications, and using patient centered shared decision making involving her cardiac care.  I spent  20 minutes reviewing her past medical history,  medications, and prior cardiac tests.

## 2024-02-14 ENCOUNTER — Ambulatory Visit: Payer: Medicare Other | Admitting: General Practice

## 2024-02-21 ENCOUNTER — Other Ambulatory Visit: Payer: Self-pay

## 2024-02-21 ENCOUNTER — Ambulatory Visit

## 2024-02-21 DIAGNOSIS — R195 Other fecal abnormalities: Secondary | ICD-10-CM | POA: Diagnosis not present

## 2024-02-21 DIAGNOSIS — Z1211 Encounter for screening for malignant neoplasm of colon: Secondary | ICD-10-CM

## 2024-02-21 LAB — FECAL OCCULT BLOOD, IMMUNOCHEMICAL: Fecal Occult Bld: NEGATIVE

## 2024-02-21 NOTE — Progress Notes (Signed)
 Marland Kitchen

## 2024-02-22 DIAGNOSIS — M1712 Unilateral primary osteoarthritis, left knee: Secondary | ICD-10-CM | POA: Diagnosis not present

## 2024-02-28 ENCOUNTER — Ambulatory Visit (INDEPENDENT_AMBULATORY_CARE_PROVIDER_SITE_OTHER): Payer: Medicare Other

## 2024-02-28 VITALS — BP 124/79 | HR 84 | Ht 68.0 in | Wt 159.0 lb

## 2024-02-28 DIAGNOSIS — J310 Chronic rhinitis: Secondary | ICD-10-CM

## 2024-02-28 DIAGNOSIS — R0981 Nasal congestion: Secondary | ICD-10-CM

## 2024-02-28 DIAGNOSIS — H6981 Other specified disorders of Eustachian tube, right ear: Secondary | ICD-10-CM | POA: Diagnosis not present

## 2024-02-28 DIAGNOSIS — H903 Sensorineural hearing loss, bilateral: Secondary | ICD-10-CM

## 2024-03-01 NOTE — Progress Notes (Signed)
 Patient ID: [name], female   DOB: Mar 21, 1946, 78 y.o.   MRN: [id number]  Follow-up: Eustachian tube dysfunction, chronic nasal congestion   HPI: The patient is a 78 year old female who returns today for her follow-up evaluation.  The patient has a history of right ear eustachian tube dysfunction and chronic rhinitis.  She was also noted to have bilateral sensorineural hearing loss, with slight right ear conductive hearing loss.  She was treated with Flonase, azelastine, and Valsalva exercise.  The patient returns today complaining of persistent popping and crackling sensation in her right ear.  She is still having occasional nasal congestion.  She has not been able to perform the Valsalva exercise to auto insufflate her middle ear spaces.   Exam: General: Communicates without difficulty, well nourished, no acute distress. Head: Normocephalic, no evidence injury, no tenderness, facial buttresses intact without stepoff. Face/sinus: No tenderness to palpation and percussion. Facial movement is normal and symmetric. Eyes: PERRL, EOMI. No scleral icterus, conjunctivae clear. Neuro: CN II exam reveals vision grossly intact.  No nystagmus at any point of gaze. Ears: Auricles well formed without lesions.  Ear canals are intact without mass or lesion.  No erythema or edema is appreciated.  The TMs are intact without fluid. Nose: External evaluation reveals normal support and skin without lesions.  Dorsum is intact.  Anterior rhinoscopy reveals congested mucosa over anterior aspect of inferior turbinates and intact septum.  No purulence noted. Oral:  Oral cavity and oropharynx are intact, symmetric, without erythema or edema.  Mucosa is moist without lesions. Neck: Full range of motion without pain.  There is no significant lymphadenopathy.  No masses palpable.  Thyroid bed within normal limits to palpation.  Parotid glands and submandibular glands equal bilaterally without mass.  Trachea is midline. Neuro:   CN 2-12 grossly intact.    Assessment: 1.  Chronic rhinitis.  The patient is noted to have congested nasal mucosa. 2.  Persistent eustachian tube dysfunction.  The patient has significant difficulty performing the Valsalva exercise. 3.  No acute infection is noted today.   Plan: 1.  The physical exam findings are reviewed with the patient. 2.  Continue Flonase and azelastine nasal sprays. 3.  Continue with Valsalva exercise multiple times a day. 4.  Allergy referral is offered to the patient.  However she is not interested in seeing the allergy doctor at this time. 5.  The option of myringotomy and tube placement is also discussed with the patient. 6.  The patient will return for reevaluation in 6 months.

## 2024-03-06 DIAGNOSIS — C50412 Malignant neoplasm of upper-outer quadrant of left female breast: Secondary | ICD-10-CM | POA: Diagnosis not present

## 2024-03-06 DIAGNOSIS — Z170 Estrogen receptor positive status [ER+]: Secondary | ICD-10-CM | POA: Diagnosis not present

## 2024-03-10 NOTE — Therapy (Signed)
 OUTPATIENT PHYSICAL THERAPY  UPPER EXTREMITY ONCOLOGY EVALUATION  Patient Name: [name] MRN: [id number] DOB:May 15, 1946, 78 y.o., female Today's Date: 03/11/2024  END OF SESSION:  PT End of Session - 03/11/24 1404     Visit Number 1    Number of Visits 12    Date for PT Re-Evaluation 04/22/24    Authorization Type Medicare    PT Start Time 1400    PT Stop Time 1458    PT Time Calculation (min) 58 min    Activity Tolerance Patient tolerated treatment well    Behavior During Therapy WFL for tasks assessed/performed             Past Medical History:  Diagnosis Date   Arthritis    hands and knees   Complication of anesthesia    aspiration pna; following a colonoscopy, pt requests head of bed elevated if possible.   Constipation    Cough    Depression    Dysrhythmia    RBBB on 06-06-17 ekg    Elevated liver function tests    Esophageal stricture    Family history of breast cancer    Gallstones    Genetic testing 04/02/2018   STAT Breast panel with reflex to Multi-Cancer panel (83 genes) @ [facility] - No pathogenic mutations detected   GERD (gastroesophageal reflux disease)    Hiatal hernia    History of kidney stones    History of radiation therapy 07/09/2018- 08/03/18   Left Breast/ 40.05 Gy in 15 fractions. Left Breast boost 10 Gy in 5 fractions.    Hyperlipemia    Hypothyroidism    Insomnia    Malignant neoplasm of upper-outer quadrant of left female breast (HCC)    Nephrolithiasis    Personal history of radiation therapy    Pneumonia 2015   aspirated after colonosopy   Renal cyst    Past Surgical History:  Procedure Laterality Date   BREAST BIOPSY Left 05/31/2019   mri   BREAST BIOPSY Left 04/2019   malignant   BREAST LUMPECTOMY Left 04/17/2018   malignant   BREAST LUMPECTOMY WITH RADIOACTIVE SEED AND SENTINEL LYMPH NODE BIOPSY Left 04/17/2018   Procedure: LEFT BREAST LUMPECTOMY WITH BRACKETED RADIOACTIVE SEEDS AND SENTINEL LYMPH NODE BIOPSY;   Surgeon: Emelia Loron, MD;  Location: [facility];  Service: General;  Laterality: Left;   BREAST SURGERY Left    Lumpectomy   CARDIOVASCULAR STRESS TEST  07/19/2006   EF 70%, NO EVIDENCE OF ISCHEMIA   CHOLECYSTECTOMY  12/30/10   COLONOSCOPY     DILATION AND CURETTAGE OF UTERUS     HYSTEROSCOPY WITH D & C N/A 06/07/2018   Procedure: DILATATION AND CURETTAGE /HYSTEROSCOPY;  Surgeon: Marcelle Overlie, MD;  Location: [facility];  Service: Gynecology;  Laterality: N/A;   inguinal herniography     NASAL SINUS SURGERY     RADIOACTIVE SEED GUIDED EXCISIONAL BREAST BIOPSY Right 04/17/2018   Procedure: RIGHT BREAST SEED GUIDED EXCISIONAL BIOPSY;  Surgeon: Emelia Loron, MD;  Location: [facility];  Service: General;  Laterality: Right;   RE-EXCISION OF BREAST LUMPECTOMY Left 05/15/2018   Procedure: RE-EXCISION OF LEFT BREAST LUMPECTOMY, ASPIRATION OF LEFT AXILLARY SEROMA;  Surgeon: Emelia Loron, MD;  Location: [facility];  Service: General;  Laterality: Left;   T & A     TOOTH EXTRACTION     TOTAL KNEE ARTHROPLASTY Left 09/11/2017   Procedure: LEFT TOTAL KNEE ARTHROPLASTY;  Surgeon: Ollen Gross, MD;  Location: [facility];  Service: Orthopedics;  Laterality: Left;  with block   US ECHOCARDIOGRAPHY  07/17/2006   EF 55-60%   Patient Active Problem List   Diagnosis Date Noted   Other specified disorders of eustachian tube, right ear 11/15/2023   Chronic rhinitis 11/15/2023   Sensorineural hearing loss, bilateral 11/15/2023   Spinal stenosis of lumbar region with radiculopathy 10/12/2021   Chronic postoperative pain 10/12/2021   Breast mass, right 03/27/2020   Hepatic steatosis 01/22/2020   Genetic testing 04/02/2018   Family history of breast cancer    Malignant neoplasm of upper-outer quadrant of left breast in female, estrogen receptor positive (HCC) 03/16/2018   OA (osteoarthritis) of knee 09/11/2017   Infrapatellar bursitis of right knee  04/29/2014   Pneumonia, aspiration (HCC) 12/28/2012   HYPOTHYROIDISM 11/15/2010   HYPERLIPIDEMIA 11/15/2010   DEPRESSION 11/15/2010   GERD 11/15/2010   CONSTIPATION 11/15/2010   GALLSTONES 11/15/2010   RENAL CYST 11/15/2010   INSOMNIA UNSPECIFIED 11/15/2010   COUGH 11/15/2010   ABDOMINAL PAIN OTHER SPECIFIED SITE 11/15/2010   TRANSAMINASES, SERUM, ELEVATED 11/15/2010    REFERRING PROVIDER: Emelia Loron, MD  REFERRING DIAG: Left arm arm swelling  THERAPY DIAG:  Abnormal posture  Left shoulder pain, unspecified chronicity  Lymphedema, not elsewhere classified  ONSET DATE: 2-3 months ago  Rationale for Evaluation and Treatment: Rehabilitation  SUBJECTIVE:                                                                                                                                                                                           SUBJECTIVE STATEMENT:  I noticed that my wrist is swollen and when I measured it was an inch bigger. I haven't noticed swelling anywhere else. I have a lot of pain in my armpit, and my shoulder hurts down to my elbow.  We took a long flight in Oct to [place], and that may be what caused the swelling. I have a compression sleeve but I didn't wear it and it really doesn't fit me now. I think my memory is affected from the drugs I had to take too. I have a lot of medical things going on now. Sciatica in my back and leg, a TKA that just had an ablation in, and a small aneurysm in my head that I Need to have checked   PERTINENT HISTORY:  04/17/2018 Bilateral lumpectomies with left SLNB with 0+/1 LN. Gr. 2 ILC Er/PR+, HER 2-, Ki 67 5%. Radiation 09/11/2018 -08/03/2018. Most recent prior PT treatment 04/05/2022 to 05/17/22 with this therapist. 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  PAIN:  Are you having pain? Yes NPRS scale: 8/10 Pain  location: axilla and left shoulder Pain orientation: Left  PAIN TYPE: aching and dull, tight Pain description: constant   Aggravating factors: not sure, reaching, lying on left hurts shoulder Relieving factors: nothing.  PRECAUTIONS: Left TKA, sciatica, Lympedema(left), small brain aneurysm  RED FLAGS: None   WEIGHT BEARING RESTRICTIONS: No  FALLS:  Has patient fallen in last 6 months? No  LIVING ENVIRONMENT: Lives with: lives with their spouse Lives in: House/apartment OCCUPATION: retired   LEISURE: reading , gardening  HAND DOMINANCE: right   PRIOR LEVEL OF FUNCTION: Independent  PATIENT GOALS: ease the pain, move better, check swelling   OBJECTIVE: Note: Objective measures were completed at Evaluation unless otherwise noted.  COGNITION: Overall cognitive status: Within functional limits for tasks assessed   PALPATION: Tender at proximal biceps, supraspinatus, mid deltoid,tender left pectorals, breast incision on left.  OBSERVATIONS / OTHER ASSESSMENTS: positive left impingement, positive empty  SENSATION: Light touch: Appears intact   POSTURE: forward head, rounded shoulders  UPPER EXTREMITY AROM/PROM:  A/PROM RIGHT   eval   Shoulder extension 65  Shoulder flexion 120  Shoulder abduction 164, slight scaption  Shoulder internal rotation 75  Shoulder external rotation 90    (Blank rows = not tested)  A/PROM LEFT   eval  Shoulder extension 54, tight  Shoulder flexion 120, tight , mild pain at top of shoulder  Shoulder abduction 90  Shoulder internal rotation   Shoulder external rotation     (Blank rows = not tested)  CERVICAL AROM:  within functional limits:     UPPER EXTREMITY STRENGTH: functional but limited by pain on left  LYMPHEDEMA ASSESSMENTS:   SURGERY TYPE/DATE: 04/17/2018 Bilateral lumpectomies with Left SLNB, had a seroma on left  NUMBER OF LYMPH NODES REMOVED: 0+/1  CHEMOTHERAPY: NO  RADIATION:Yes   HORMONE TREATMENT: end 07/2018  INFECTIONS: NO   LYMPHEDEMA ASSESSMENTS:   LANDMARK RIGHT  eval  At axilla  33.0  15 cm proximal to  olecranon process 32.3  10 cm proximal to olecranon process 30.8  Olecranon process 25.7  15 cm proximal to ulnar styloid process 23.3  10 cm proximal to ulnar styloid process 20.1  Just proximal to ulnar styloid process 14.2  Across hand at thumb web space 18.0  At base of 2nd digit 6.2  (Blank rows = not tested)  LANDMARK LEFT  eval  At axilla  33.7  15 cm proximal to olecranon process 33.5  10 cm proximal to olecranon process 32.3  Olecranon process 26.8  15 cm proximal to ulnar styloid process 23.3  10 cm proximal to ulnar styloid process 20.0  Just proximal to ulnar styloid process 15.25  Across hand at thumb web space 18.4  At base of 2nd digit 6.1  (Blank rows = not tested)   FUNCTIONAL TESTS:    GAIT: flexed posture,increased kyphosis, independent     QUICK DASH SURVEY: 40.91  TREATMENT DATE:  03/11/2024 Discussed bandaging vs sleeve and due to other medical concerns she prefers not to bandage, but is willing to get a new sleeve as hers does not fit. We discussed manual techniques for tight and sore muscles in axilla/lateral arm, and the need to improve posture and strength to unload her left shoulder. Advised that if shoulder pain is not improving we will ask her to see an orthopedic MD.    PATIENT EDUCATION:  Education details: see above Person educated: Patient Education method: Explanation Education comprehension: verbalized understanding  HOME EXERCISE PROGRAM: NA  ASSESSMENT:  CLINICAL IMPRESSION: Patient is a 78 y.o. female  who was seen today for physical therapy evaluation and treatment for complaints of left shoulder pain and lateral breast pain and left UE swelling. Left shoulder ROM is decreased with abd to 90 degrees with pain in the left shoulder joint. She has positive impingement and positive empty can. There is  tenderness to the left proximal biceps, supraspinatus insertion, mid deltoid and posterior shoulder. There is increased edema in the left UE, with swelling noted in the upper arm and left wrist area. She is not interested in bandaging but will purchase a sleeve, and may be willing to learn MLD. She will benefit from skilled therapy to address deficits and return to PLOF   OBJECTIVE IMPAIRMENTS: decreased activity tolerance, decreased knowledge of condition, decreased ROM, decreased strength, increased edema, impaired UE functional use, postural dysfunction, and pain.   ACTIVITY LIMITATIONS: lifting, sleeping, dressing, and reach over head  PARTICIPATION LIMITATIONS: cleaning and laundry  PERSONAL FACTORS:  Left breast Cancer, radiation are also patient's functional outcome.   REHAB POTENTIAL: Good  CLINICAL DECISION MAKING: Evolving/moderate complexity  EVALUATION COMPLEXITY: Moderate  GOALS: Goals reviewed with patient? Yes  SHORT TERM GOALS: Target date: 04/01/2024  Pt will be independent and compliant with HEP for left shoulder ROM/strength  Baseline: Goal status: INITIAL  2.  Pt will be fit for appropriate flat knit compression sleeve to address left UE lymphedema Baseline:  Goal status: INITIAL  3.  Pt will have decreased left shoulder/axillary pain by 25% or more Baseline:  Goal status: INITIAL  4.  Quick dash will improve to greater than 30% to demonstrate improved function Baseline:  Goal status: INITIAL   LONG TERM GOALS: Target date: 04/22/2024  Pt will have decreased complaints left shoulder/axillary pain  by 50%  Baseline:  Goal status: INITIAL  2.  Pt will have left shoulder ROM WNL as compared to right UE   Baseline:  Goal status: INITIAL  3.  Pts quick dash will be no greater than 22% to demonstrate improved function in left shoulder Baseline:  Goal status: INITIAL  4.  Pt will be educated in self MLD to decrease left UE swelling prn Baseline:  Goal  status: INITIAL   PLAN:  PT FREQUENCY: 2x/week  PT DURATION: 6 weeks  PLANNED INTERVENTIONS: 97164- PT Re-evaluation, 97110-Therapeutic exercises, 97530- Therapeutic activity, 97112- Neuromuscular re-education, 97535- Self Care, [id number]- Manual therapy, [phone]- Orthotic Fit/training, Patient/Family education, and Joint mobilization  PLAN FOR NEXT SESSION: measure for flat knit sleeve and order if willing with self pay, STM left UT, axillary border of pectorals, initiate postural strength with band if tolerated or gentle isometrics if not, MLD to left UE and instruct pt  PCP:  pt Waynette Buttery, PT 03/11/2024, 3:48 PM

## 2024-03-11 ENCOUNTER — Other Ambulatory Visit: Payer: Self-pay

## 2024-03-11 ENCOUNTER — Ambulatory Visit: Attending: General Surgery

## 2024-03-11 DIAGNOSIS — M25512 Pain in left shoulder: Secondary | ICD-10-CM | POA: Insufficient documentation

## 2024-03-11 DIAGNOSIS — I890 Lymphedema, not elsewhere classified: Secondary | ICD-10-CM | POA: Insufficient documentation

## 2024-03-11 DIAGNOSIS — M25612 Stiffness of left shoulder, not elsewhere classified: Secondary | ICD-10-CM | POA: Diagnosis not present

## 2024-03-11 DIAGNOSIS — R293 Abnormal posture: Secondary | ICD-10-CM | POA: Insufficient documentation

## 2024-03-15 ENCOUNTER — Ambulatory Visit: Admitting: General Practice

## 2024-03-15 ENCOUNTER — Ambulatory Visit: Attending: General Surgery

## 2024-03-15 DIAGNOSIS — M25612 Stiffness of left shoulder, not elsewhere classified: Secondary | ICD-10-CM | POA: Diagnosis not present

## 2024-03-15 DIAGNOSIS — R293 Abnormal posture: Secondary | ICD-10-CM

## 2024-03-15 DIAGNOSIS — M25512 Pain in left shoulder: Secondary | ICD-10-CM

## 2024-03-15 DIAGNOSIS — C50412 Malignant neoplasm of upper-outer quadrant of left female breast: Secondary | ICD-10-CM | POA: Insufficient documentation

## 2024-03-15 DIAGNOSIS — R600 Localized edema: Secondary | ICD-10-CM | POA: Diagnosis not present

## 2024-03-15 DIAGNOSIS — Z170 Estrogen receptor positive status [ER+]: Secondary | ICD-10-CM | POA: Diagnosis not present

## 2024-03-15 DIAGNOSIS — I890 Lymphedema, not elsewhere classified: Secondary | ICD-10-CM

## 2024-03-15 NOTE — Therapy (Signed)
 OUTPATIENT PHYSICAL THERAPY  UPPER EXTREMITY ONCOLOGY TREATMENT  Patient Name: [name] MRN: [id number] DOB:Jun 11, 1946, 78 y.o., female Today's Date: 03/15/2024  END OF SESSION:  PT End of Session - 03/15/24 1008     Visit Number 2    Number of Visits 12    Date for PT Re-Evaluation 04/22/24    Authorization Type Medicare    PT Start Time 1006    PT Stop Time 1105    PT Time Calculation (min) 59 min    Activity Tolerance Patient tolerated treatment well    Behavior During Therapy WFL for tasks assessed/performed             Past Medical History:  Diagnosis Date   Arthritis    hands and knees   Complication of anesthesia    aspiration pna; following a colonoscopy, pt requests head of bed elevated if possible.   Constipation    Cough    Depression    Dysrhythmia    RBBB on 06-06-17 ekg    Elevated liver function tests    Esophageal stricture    Family history of breast cancer    Gallstones    Genetic testing 04/02/2018   STAT Breast panel with reflex to Multi-Cancer panel (83 genes) @ [facility] - No pathogenic mutations detected   GERD (gastroesophageal reflux disease)    Hiatal hernia    History of kidney stones    History of radiation therapy 07/09/2018- 08/03/18   Left Breast/ 40.05 Gy in 15 fractions. Left Breast boost 10 Gy in 5 fractions.    Hyperlipemia    Hypothyroidism    Insomnia    Malignant neoplasm of upper-outer quadrant of left female breast (HCC)    Nephrolithiasis    Personal history of radiation therapy    Pneumonia 2015   aspirated after colonosopy   Renal cyst    Past Surgical History:  Procedure Laterality Date   BREAST BIOPSY Left 05/31/2019   mri   BREAST BIOPSY Left 04/2019   malignant   BREAST LUMPECTOMY Left 04/17/2018   malignant   BREAST LUMPECTOMY WITH RADIOACTIVE SEED AND SENTINEL LYMPH NODE BIOPSY Left 04/17/2018   Procedure: LEFT BREAST LUMPECTOMY WITH BRACKETED RADIOACTIVE SEEDS AND SENTINEL LYMPH NODE BIOPSY;  Surgeon:  Emelia Loron, MD;  Location: [facility];  Service: General;  Laterality: Left;   BREAST SURGERY Left    Lumpectomy   CARDIOVASCULAR STRESS TEST  07/19/2006   EF 70%, NO EVIDENCE OF ISCHEMIA   CHOLECYSTECTOMY  12/30/10   COLONOSCOPY     DILATION AND CURETTAGE OF UTERUS     HYSTEROSCOPY WITH D & C N/A 06/07/2018   Procedure: DILATATION AND CURETTAGE /HYSTEROSCOPY;  Surgeon: Marcelle Overlie, MD;  Location: [facility];  Service: Gynecology;  Laterality: N/A;   inguinal herniography     NASAL SINUS SURGERY     RADIOACTIVE SEED GUIDED EXCISIONAL BREAST BIOPSY Right 04/17/2018   Procedure: RIGHT BREAST SEED GUIDED EXCISIONAL BIOPSY;  Surgeon: Emelia Loron, MD;  Location: [facility];  Service: General;  Laterality: Right;   RE-EXCISION OF BREAST LUMPECTOMY Left 05/15/2018   Procedure: RE-EXCISION OF LEFT BREAST LUMPECTOMY, ASPIRATION OF LEFT AXILLARY SEROMA;  Surgeon: Emelia Loron, MD;  Location: [facility];  Service: General;  Laterality: Left;   T & A     TOOTH EXTRACTION     TOTAL KNEE ARTHROPLASTY Left 09/11/2017   Procedure: LEFT TOTAL KNEE ARTHROPLASTY;  Surgeon: Ollen Gross, MD;  Location: [facility];  Service: Orthopedics;  Laterality: Left;  with block   US ECHOCARDIOGRAPHY  07/17/2006   EF 55-60%   Patient Active Problem List   Diagnosis Date Noted   Other specified disorders of eustachian tube, right ear 11/15/2023   Chronic rhinitis 11/15/2023   Sensorineural hearing loss, bilateral 11/15/2023   Spinal stenosis of lumbar region with radiculopathy 10/12/2021   Chronic postoperative pain 10/12/2021   Breast mass, right 03/27/2020   Hepatic steatosis 01/22/2020   Genetic testing 04/02/2018   Family history of breast cancer    Malignant neoplasm of upper-outer quadrant of left breast in female, estrogen receptor positive (HCC) 03/16/2018   OA (osteoarthritis) of knee 09/11/2017   Infrapatellar bursitis of right knee 04/29/2014    Pneumonia, aspiration (HCC) 12/28/2012   HYPOTHYROIDISM 11/15/2010   HYPERLIPIDEMIA 11/15/2010   DEPRESSION 11/15/2010   GERD 11/15/2010   CONSTIPATION 11/15/2010   GALLSTONES 11/15/2010   RENAL CYST 11/15/2010   INSOMNIA UNSPECIFIED 11/15/2010   COUGH 11/15/2010   ABDOMINAL PAIN OTHER SPECIFIED SITE 11/15/2010   TRANSAMINASES, SERUM, ELEVATED 11/15/2010    REFERRING PROVIDER: Emelia Loron, MD  REFERRING DIAG: Left arm arm swelling  THERAPY DIAG:  Abnormal posture  Left shoulder pain, unspecified chronicity  Lymphedema, not elsewhere classified  ONSET DATE: 2-3 months ago  Rationale for Evaluation and Treatment: Rehabilitation  SUBJECTIVE:                                                                                                                                                                                           SUBJECTIVE STATEMENT:  My Lt arm is swelling more so I think I need to try bandaging to get it back down. I feel like I'm swelling down to my wrist now.    PERTINENT HISTORY:  04/17/2018 Bilateral lumpectomies with left SLNB with 0+/1 LN. Gr. 2 ILC Er/PR+, HER 2-, Ki 67 5%. Radiation 09/11/2018 -08/03/2018. Most recent prior PT treatment 04/05/2022 to 05/17/22 with this therapist. 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  PAIN:  Are you having pain? Yes NPRS scale: 9/10 Pain location: axilla and left shoulder Pain orientation: Left  PAIN TYPE: aching and dull, tight Pain description: constant  Aggravating factors: not sure, reaching, lying on left hurts shoulder Relieving factors: nothing.  PRECAUTIONS: Left TKA, sciatica, Lympedema(left), small brain aneurysm  RED FLAGS: None   WEIGHT BEARING RESTRICTIONS: No  FALLS:  Has patient fallen in last 6 months? No  LIVING ENVIRONMENT: Lives with: lives with their spouse Lives in: House/apartment OCCUPATION: retired   LEISURE: reading , gardening  HAND DOMINANCE: right   PRIOR LEVEL OF FUNCTION:  Independent  PATIENT GOALS: ease the pain, move better, check swelling   OBJECTIVE: Note: Objective measures were completed at Evaluation unless otherwise noted.  COGNITION: Overall cognitive status: Within functional limits for tasks assessed   PALPATION: Tender at proximal biceps, supraspinatus, mid deltoid,tender left pectorals, breast incision on left.  OBSERVATIONS / OTHER ASSESSMENTS: positive left impingement, positive empty  SENSATION: Light touch: Appears intact   POSTURE: forward head, rounded shoulders  UPPER EXTREMITY AROM/PROM:  A/PROM RIGHT   eval   Shoulder extension 65  Shoulder flexion 120  Shoulder abduction 164, slight scaption  Shoulder internal rotation 75  Shoulder external rotation 90    (Blank rows = not tested)  A/PROM LEFT   eval  Shoulder extension 54, tight  Shoulder flexion 120, tight , mild pain at top of shoulder  Shoulder abduction 90  Shoulder internal rotation   Shoulder external rotation     (Blank rows = not tested)  CERVICAL AROM:  within functional limits:     UPPER EXTREMITY STRENGTH: functional but limited by pain on left  LYMPHEDEMA ASSESSMENTS:   SURGERY TYPE/DATE: 04/17/2018 Bilateral lumpectomies with Left SLNB, had a seroma on left  NUMBER OF LYMPH NODES REMOVED: 0+/1  CHEMOTHERAPY: NO  RADIATION:Yes   HORMONE TREATMENT: end 07/2018  INFECTIONS: NO   LYMPHEDEMA ASSESSMENTS:   LANDMARK RIGHT  eval  At axilla  33.0  15 cm proximal to olecranon process 32.3  10 cm proximal to olecranon process 30.8  Olecranon process 25.7  15 cm proximal to ulnar styloid process 23.3  10 cm proximal to ulnar styloid process 20.1  Just proximal to ulnar styloid process 14.2  Across hand at thumb web space 18.0  At base of 2nd digit 6.2  (Blank rows = not tested)  LANDMARK LEFT  eval Left 03/15/24  At axilla  33.7   15 cm proximal to olecranon process 33.5   10 cm proximal to olecranon process 32.3   Olecranon  process 26.8   15 cm proximal to ulnar styloid process 23.3   10 cm proximal to ulnar styloid process 20.0   Just proximal to ulnar styloid process 15.25 15.2  Across hand at thumb web space 18.4   At base of 2nd digit 6.1   (Blank rows = not tested)   FUNCTIONAL TESTS:    GAIT: flexed posture,increased kyphosis, independent     QUICK DASH SURVEY: 40.91                                                                                                                            TREATMENT DATE:  03/15/24: Manual Therapy MLD to Lt UE: Short neck, superficial and deep abdominals, Lt inguinal nodes and Lt axillo-inguinal anastomosis, Rt axillary and pectoral nodes, anterior intact thorax sequence, and then anterior inter-axillary anastomosis, then Lt UE working from proximal to distal and retracing all steps back to anastomosis and began educating pt about basics of anatomy of lymphatic system during MLD P/ROM  to Lt shoulder during MLD into flex, abd and D2 with scapular depression by therapist throughout which pt reported this feeling very good and her end P/ROM was much improved by end of session. STM to Lt axilla, pectoralis insertion and lateral trunk, all areas where pt is palpably very tight. This did soften very well by end of session  Compression Bandaging to Lt UE as follows: Cocoa butter to Lt UE, TG soft med, Mollelast to fingers 1-4, Artiflex to UE, and then 1-6 cm short stretch compression bandage to hand/wrist, 1-8 cm spiral from wrist to "X" at elbow, and then 1-10 cm spiral from wrist to axilla folding Tg soft over top of bandage. Pt was instructed in remedial exs to perform with bandages on and to remove when they start sliding down, possibly Sat evening or Sun morning and to bring all bandages back with her.   03/11/2024 Discussed bandaging vs sleeve and due to other medical concerns she prefers not to bandage, but is willing to get a new sleeve as hers does not fit. We discussed  manual techniques for tight and sore muscles in axilla/lateral arm, and the need to improve posture and strength to unload her left shoulder. Advised that if shoulder pain is not improving we will ask her to see an orthopedic MD.    PATIENT EDUCATION:  Education details: see above Person educated: Patient Education method: Explanation Education comprehension: verbalized understanding  HOME EXERCISE PROGRAM: NA  ASSESSMENT:  CLINICAL IMPRESSION: Pt comes in c/o increased Lt UE pain/discomfort and felt like she had increased lymphedema down to her wrist, though her circumference measurements were the same. So pt requested compression bandaging today so as to focus on reducing lymphedema in Lt UE. Also began MLD and began educating her in basic principles of anatomy of lymphatic system and principles of MLD. Pt was instructed in remedial exs to perform while wearing bandages and encouraged to keep her arm moving. She was also instructed when to remove them and to bring bandages back to each appt. She verbalized good understanding. She is going to add appts to come 3x/wk for bandaging for at least 2 weeks, PT was informed so she can adjust POC accordingly since pt changed her mind since eval.    OBJECTIVE IMPAIRMENTS: decreased activity tolerance, decreased knowledge of condition, decreased ROM, decreased strength, increased edema, impaired UE functional use, postural dysfunction, and pain.   ACTIVITY LIMITATIONS: lifting, sleeping, dressing, and reach over head  PARTICIPATION LIMITATIONS: cleaning and laundry  PERSONAL FACTORS:  Left breast Cancer, radiation are also patient's functional outcome.   REHAB POTENTIAL: Good  CLINICAL DECISION MAKING: Evolving/moderate complexity  EVALUATION COMPLEXITY: Moderate  GOALS: Goals reviewed with patient? Yes  SHORT TERM GOALS: Target date: 04/01/2024  Pt will be independent and compliant with HEP for left shoulder ROM/strength   Baseline: Goal status: INITIAL  2.  Pt will be fit for appropriate flat knit compression sleeve to address left UE lymphedema Baseline:  Goal status: INITIAL  3.  Pt will have decreased left shoulder/axillary pain by 25% or more Baseline:  Goal status: INITIAL  4.  Quick dash will improve to greater than 30% to demonstrate improved function Baseline:  Goal status: INITIAL   LONG TERM GOALS: Target date: 04/22/2024  Pt will have decreased complaints left shoulder/axillary pain  by 50%  Baseline:  Goal status: INITIAL  2.  Pt will have left shoulder ROM WNL as compared to right UE   Baseline:  Goal status: INITIAL  3.  Pts quick dash will be no greater than 22% to demonstrate improved function in left shoulder Baseline:  Goal status: INITIAL  4.  Pt will be educated in self MLD to decrease left UE swelling prn Baseline:  Goal status: INITIAL   PLAN:  PT FREQUENCY: 2x/week  PT DURATION: 6 weeks  PLANNED INTERVENTIONS: 97164- PT Re-evaluation, 97110-Therapeutic exercises, 97530- Therapeutic activity, 97112- Neuromuscular re-education, 97535- Self Care, 16109- Manual therapy, [phone]- Orthotic Fit/training, Patient/Family education, and Joint mobilization  PLAN FOR NEXT SESSION: Change POC to 3x/wk as pt would like to do compression bandaging now as PT discussed with her at eval. How did she tolerate bandages? Measure for flat knit sleeve once reduced (may only need bandaging for 2 wks) and order if willing with self pay, STM left UT, axillary border of pectorals, initiate postural strength with band if tolerated or gentle isometrics if not, MLD to left UE and instruct pt  PCP:  pt Hermenia Bers, PTA 03/15/2024, 12:21 PM

## 2024-03-18 ENCOUNTER — Ambulatory Visit

## 2024-03-18 DIAGNOSIS — M25512 Pain in left shoulder: Secondary | ICD-10-CM

## 2024-03-18 DIAGNOSIS — I890 Lymphedema, not elsewhere classified: Secondary | ICD-10-CM | POA: Diagnosis not present

## 2024-03-18 DIAGNOSIS — M25612 Stiffness of left shoulder, not elsewhere classified: Secondary | ICD-10-CM | POA: Diagnosis not present

## 2024-03-18 DIAGNOSIS — R600 Localized edema: Secondary | ICD-10-CM | POA: Diagnosis not present

## 2024-03-18 DIAGNOSIS — R293 Abnormal posture: Secondary | ICD-10-CM | POA: Diagnosis not present

## 2024-03-18 DIAGNOSIS — C50412 Malignant neoplasm of upper-outer quadrant of left female breast: Secondary | ICD-10-CM | POA: Diagnosis not present

## 2024-03-18 NOTE — Patient Instructions (Signed)
PLEASE KEEP YOUR BANDAGES ON AS LONG AS POSSIBLE TO GET THE BEST SWELLING REDUCTION. ?Should your bandages become uncomfortable or feel too tight, follow these steps: ?Elevate your extremity higher than your heart.  ?Try to move your arm or leg joints against the firmness of the bandage to help with moving the fluid and allow the bandages to loosen a bit.  ?If the bandaging is still is too tight, it is ok to carefully remove the top layer.  There will still be more layers under it that can provide compression to your extremity. ?Finally, if you STILL have significant pain after trying these steps, it is ok to take the bandage off.  Check your skin carefully for any signs of irritation  ?PLEASE bring ALL bandage materials back to your next appointment as we will reuse what we can ?TAKE CARE OF YOUR BANDAGES SO THEY WILL LAST LONGER AND STAY IN BETTER CONDITION ?Washing bandages:  Wash periodically using a mild detergent in warm water.  Do not use fabric softener or bleach.  Place bandages in a mesh lingerie bag or in a tied off pillow case and use the gentle cycle of the washing machine or hand wash. If you hand wash, you may want to put them in the spin cycle of your washer to get the extra water out, but make sure you put them in a mesh bag first. Do not wring or stretch them while they are wet.  ?Drying bandages: Lay the bandages out smoothly on a towel away from direct sunlight or heating sources that can damage the fabric. Rolling bandages in a towel and gently squeezing the towel to remove excess water before laying them out can speed up the process.  If you use a drying rack, place a towel on top of the rack to lay the bandages on.  If they hang down to dry, they fabric could be stretched out and the bandage will lose its compression.   Or, keep bandages in the mesh bag and dry them in the dryer on the low or no heat cycle. ?Rolling bandages: Please roll your bandages after drying them so they are ready for  your next treatment. If they are rolled too loose, they will be difficult to apply.  If rolled too tight, they can get stretched out.  ? ?TAKE CARE OF YOUR SKIN ?Apply a low pH moisturizing lotion to your skin daily ?Avoid scratching your skin ?Treat skin irritations quickly  ?Know the 5 warning signs of infection: redness, pain, warmth to touch, fever and increased swelling.  Call your physician immediately if you notice any of these signs of a possible infection. ? ?

## 2024-03-18 NOTE — Therapy (Signed)
 OUTPATIENT PHYSICAL THERAPY  UPPER EXTREMITY ONCOLOGY TREATMENT  Patient Name: [name] MRN: [id number] DOB:03-03-1946, 78 y.o., female Today's Date: 03/18/2024  END OF SESSION:  PT End of Session - 03/18/24 1500     Visit Number 3    Number of Visits 18    Date for PT Re-Evaluation 04/22/24    Authorization Type Medicare    PT Start Time 1502    PT Stop Time 1559    PT Time Calculation (min) 57 min    Activity Tolerance Patient tolerated treatment well    Behavior During Therapy WFL for tasks assessed/performed             Past Medical History:  Diagnosis Date   Arthritis    hands and knees   Complication of anesthesia    aspiration pna; following a colonoscopy, pt requests head of bed elevated if possible.   Constipation    Cough    Depression    Dysrhythmia    RBBB on 06-06-17 ekg    Elevated liver function tests    Esophageal stricture    Family history of breast cancer    Gallstones    Genetic testing 04/02/2018   STAT Breast panel with reflex to Multi-Cancer panel (83 genes) @ [facility] - No pathogenic mutations detected   GERD (gastroesophageal reflux disease)    Hiatal hernia    History of kidney stones    History of radiation therapy 07/09/2018- 08/03/18   Left Breast/ 40.05 Gy in 15 fractions. Left Breast boost 10 Gy in 5 fractions.    Hyperlipemia    Hypothyroidism    Insomnia    Malignant neoplasm of upper-outer quadrant of left female breast (HCC)    Nephrolithiasis    Personal history of radiation therapy    Pneumonia 2015   aspirated after colonosopy   Renal cyst    Past Surgical History:  Procedure Laterality Date   BREAST BIOPSY Left 05/31/2019   mri   BREAST BIOPSY Left 04/2019   malignant   BREAST LUMPECTOMY Left 04/17/2018   malignant   BREAST LUMPECTOMY WITH RADIOACTIVE SEED AND SENTINEL LYMPH NODE BIOPSY Left 04/17/2018   Procedure: LEFT BREAST LUMPECTOMY WITH BRACKETED RADIOACTIVE SEEDS AND SENTINEL LYMPH NODE BIOPSY;  Surgeon:  Emelia Loron, MD;  Location: [facility];  Service: General;  Laterality: Left;   BREAST SURGERY Left    Lumpectomy   CARDIOVASCULAR STRESS TEST  07/19/2006   EF 70%, NO EVIDENCE OF ISCHEMIA   CHOLECYSTECTOMY  12/30/10   COLONOSCOPY     DILATION AND CURETTAGE OF UTERUS     HYSTEROSCOPY WITH D & C N/A 06/07/2018   Procedure: DILATATION AND CURETTAGE /HYSTEROSCOPY;  Surgeon: Marcelle Overlie, MD;  Location: [facility];  Service: Gynecology;  Laterality: N/A;   inguinal herniography     NASAL SINUS SURGERY     RADIOACTIVE SEED GUIDED EXCISIONAL BREAST BIOPSY Right 04/17/2018   Procedure: RIGHT BREAST SEED GUIDED EXCISIONAL BIOPSY;  Surgeon: Emelia Loron, MD;  Location: [facility];  Service: General;  Laterality: Right;   RE-EXCISION OF BREAST LUMPECTOMY Left 05/15/2018   Procedure: RE-EXCISION OF LEFT BREAST LUMPECTOMY, ASPIRATION OF LEFT AXILLARY SEROMA;  Surgeon: Emelia Loron, MD;  Location: [facility];  Service: General;  Laterality: Left;   T & A     TOOTH EXTRACTION     TOTAL KNEE ARTHROPLASTY Left 09/11/2017   Procedure: LEFT TOTAL KNEE ARTHROPLASTY;  Surgeon: Ollen Gross, MD;  Location: [facility];  Service: Orthopedics;  Laterality: Left;  with block   US ECHOCARDIOGRAPHY  07/17/2006   EF 55-60%   Patient Active Problem List   Diagnosis Date Noted   Other specified disorders of eustachian tube, right ear 11/15/2023   Chronic rhinitis 11/15/2023   Sensorineural hearing loss, bilateral 11/15/2023   Spinal stenosis of lumbar region with radiculopathy 10/12/2021   Chronic postoperative pain 10/12/2021   Breast mass, right 03/27/2020   Hepatic steatosis 01/22/2020   Genetic testing 04/02/2018   Family history of breast cancer    Malignant neoplasm of upper-outer quadrant of left breast in female, estrogen receptor positive (HCC) 03/16/2018   OA (osteoarthritis) of knee 09/11/2017   Infrapatellar bursitis of right knee 04/29/2014    Pneumonia, aspiration (HCC) 12/28/2012   HYPOTHYROIDISM 11/15/2010   HYPERLIPIDEMIA 11/15/2010   DEPRESSION 11/15/2010   GERD 11/15/2010   CONSTIPATION 11/15/2010   GALLSTONES 11/15/2010   RENAL CYST 11/15/2010   INSOMNIA UNSPECIFIED 11/15/2010   COUGH 11/15/2010   ABDOMINAL PAIN OTHER SPECIFIED SITE 11/15/2010   TRANSAMINASES, SERUM, ELEVATED 11/15/2010    REFERRING PROVIDER: Emelia Loron, MD  REFERRING DIAG: Left arm arm swelling  THERAPY DIAG:  Abnormal posture  Left shoulder pain, unspecified chronicity  Lymphedema, not elsewhere classified  ONSET DATE: 2-3 months ago  Rationale for Evaluation and Treatment: Rehabilitation  SUBJECTIVE:                                                                                                                                                                                           SUBJECTIVE STATEMENT:  I wore the wrap until Sunday morning and it felt OK. It felt a little tight the first day but after that it was OK. Left arm pain was better after wrapping it. I am still sore, but not as bad as it was. I didn't notice any real change in swelling.   PERTINENT HISTORY:  04/17/2018 Bilateral lumpectomies with left SLNB with 0+/1 LN. Gr. 2 ILC Er/PR+, HER 2-, Ki 67 5%. Radiation 09/11/2018 -08/03/2018. Most recent prior PT treatment 04/05/2022 to 05/17/22 with this therapist. 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  PAIN:  Are you having pain? Yes NPRS scale: 9/10 Pain location: axilla and left shoulder Pain orientation: Left  PAIN TYPE: aching and dull, tight Pain description: constant  Aggravating factors: not sure, reaching, lying on left hurts shoulder Relieving factors: nothing.  PRECAUTIONS: Left TKA, sciatica, Lympedema(left), small brain aneurysm  RED FLAGS: None   WEIGHT BEARING RESTRICTIONS: No  FALLS:  Has patient fallen in last 6 months? No  LIVING ENVIRONMENT: Lives with: lives with their spouse Lives in:  House/apartment OCCUPATION: retired   LEISURE: reading , gardening  HAND DOMINANCE: right   PRIOR LEVEL OF FUNCTION: Independent  PATIENT GOALS: ease the pain, move better, check swelling   OBJECTIVE: Note: Objective measures were completed at Evaluation unless otherwise noted.  COGNITION: Overall cognitive status: Within functional limits for tasks assessed   PALPATION: Tender at proximal biceps, supraspinatus, mid deltoid,tender left pectorals, breast incision on left.  OBSERVATIONS / OTHER ASSESSMENTS: positive left impingement, positive empty  SENSATION: Light touch: Appears intact   POSTURE: forward head, rounded shoulders  UPPER EXTREMITY AROM/PROM:  A/PROM RIGHT   eval   Shoulder extension 65  Shoulder flexion 120  Shoulder abduction 164, slight scaption  Shoulder internal rotation 75  Shoulder external rotation 90    (Blank rows = not tested)  A/PROM LEFT   eval  Shoulder extension 54, tight  Shoulder flexion 120, tight , mild pain at top of shoulder  Shoulder abduction 90  Shoulder internal rotation   Shoulder external rotation     (Blank rows = not tested)  CERVICAL AROM:  within functional limits:     UPPER EXTREMITY STRENGTH: functional but limited by pain on left  LYMPHEDEMA ASSESSMENTS:   SURGERY TYPE/DATE: 04/17/2018 Bilateral lumpectomies with Left SLNB, had a seroma on left  NUMBER OF LYMPH NODES REMOVED: 0+/1  CHEMOTHERAPY: NO  RADIATION:Yes   HORMONE TREATMENT: end 07/2018  INFECTIONS: NO   LYMPHEDEMA ASSESSMENTS:   LANDMARK RIGHT  eval  At axilla  33.0  15 cm proximal to olecranon process 32.3  10 cm proximal to olecranon process 30.8  Olecranon process 25.7  15 cm proximal to ulnar styloid process 23.3  10 cm proximal to ulnar styloid process 20.1  Just proximal to ulnar styloid process 14.2  Across hand at thumb web space 18.0  At base of 2nd digit 6.2  (Blank rows = not tested)  LANDMARK LEFT  eval Left 03/15/24   At axilla  33.7   15 cm proximal to olecranon process 33.5   10 cm proximal to olecranon process 32.3   Olecranon process 26.8   15 cm proximal to ulnar styloid process 23.3   10 cm proximal to ulnar styloid process 20.0   Just proximal to ulnar styloid process 15.25 15.2  Across hand at thumb web space 18.4   At base of 2nd digit 6.1   (Blank rows = not tested)   FUNCTIONAL TESTS:    GAIT: flexed posture,increased kyphosis, independent     QUICK DASH SURVEY: 40.91                                                                                                                            TREATMENT DATE:   03/18/2024 STM to Lt axilla, pectoralis insertion and lateral trunk with cocoa butter PROM to left shoulder for flexion, scaption and shoulder abduction with VC's to get pt to relax MLD to Lt UE: Short neck, superficial and deep abdominals, Lt  inguinal nodes and Lt axillo-inguinal anastomosis, Rt axillary and pectoral nodes, anterior intact thorax sequence, and then anterior inter-axillary anastomosis, then Lt UE working from proximal to distal and retracing all steps back to anastomosis and continued educating pt about basics of anatomy of lymphatic system during MLD Compression Bandaging to Lt UE as follows: Cocoa butter to Lt UE, TG soft med, Mollelast to fingers 1-4, New Artiflex to UE, and then 1-6 cm short stretch compression bandage to hand/wrist, 1-8 cm spiral from wrist to "X" at elbow, and then 1-10 cm spiral from wrist to axilla folding Tg soft over top of bandage. Pt was instructed in remedial exs to perform with bandages on and to remove when they start sliding down. Bandaging instructions for care and removal given. 03/15/24: Manual Therapy MLD to Lt UE: Short neck, superficial and deep abdominals, Lt inguinal nodes and Lt axillo-inguinal anastomosis, Rt axillary and pectoral nodes, anterior intact thorax sequence, and then anterior inter-axillary anastomosis, then Lt UE  working from proximal to distal and retracing all steps back to anastomosis and began educating pt about basics of anatomy of lymphatic system during MLD P/ROM to Lt shoulder during MLD into flex, abd and D2 with scapular depression by therapist throughout which pt reported this feeling very good and her end P/ROM was much improved by end of session. STM to Lt axilla, pectoralis insertion and lateral trunk, all areas where pt is palpably very tight. This did soften very well by end of session  Compression Bandaging to Lt UE as follows: Cocoa butter to Lt UE, TG soft med, Mollelast to fingers 1-4, Artiflex to UE, and then 1-6 cm short stretch compression bandage to hand/wrist, 1-8 cm spiral from wrist to "X" at elbow, and then 1-10 cm spiral from wrist to axilla folding Tg soft over top of bandage. Pt was instructed in remedial exs to perform with bandages on and to remove when they start sliding down, possibly Sat evening or Sun morning and to bring all bandages back with her.   03/11/2024 Discussed bandaging vs sleeve and due to other medical concerns she prefers not to bandage, but is willing to get a new sleeve as hers does not fit. We discussed manual techniques for tight and sore muscles in axilla/lateral arm, and the need to improve posture and strength to unload her left shoulder. Advised that if shoulder pain is not improving we will ask her to see an orthopedic MD.    PATIENT EDUCATION:  Education details: see above Person educated: Patient Education method: Explanation Education comprehension: verbalized understanding  HOME EXERCISE PROGRAM: NA  ASSESSMENT:  CLINICAL IMPRESSION: Pt tolerated bandaging quite well and left them on until Sunday morning when she removed and took a shower.  She believes her arm feels a little better, but is still a little sore.Continued STM, PROM of shoulder, MLD with verbalization to pt, and compression bandaging. New cert sent due to increasing POC to  3x/week for bandaging. OBJECTIVE IMPAIRMENTS: decreased activity tolerance, decreased knowledge of condition, decreased ROM, decreased strength, increased edema, impaired UE functional use, postural dysfunction, and pain.   ACTIVITY LIMITATIONS: lifting, sleeping, dressing, and reach over head  PARTICIPATION LIMITATIONS: cleaning and laundry  PERSONAL FACTORS:  Left breast Cancer, radiation are also patient's functional outcome.   REHAB POTENTIAL: Good  CLINICAL DECISION MAKING: Evolving/moderate complexity  EVALUATION COMPLEXITY: Moderate  GOALS: Goals reviewed with patient? Yes  SHORT TERM GOALS: Target date: 04/01/2024  Pt will be independent and compliant with HEP for  left shoulder ROM/strength  Baseline: Goal status: INITIAL  2.  Pt will be fit for appropriate flat knit compression sleeve to address left UE lymphedema Baseline:  Goal status: INITIAL  3.  Pt will have decreased left shoulder/axillary pain by 25% or more Baseline:  Goal status: INITIAL  4.  Quick dash will improve to greater than 30% to demonstrate improved function Baseline:  Goal status: INITIAL   LONG TERM GOALS: Target date: 04/22/2024  Pt will have decreased complaints left shoulder/axillary pain  by 50%  Baseline:  Goal status: INITIAL  2.  Pt will have left shoulder ROM WNL as compared to right UE   Baseline:  Goal status: INITIAL  3.  Pts quick dash will be no greater than 22% to demonstrate improved function in left shoulder Baseline:  Goal status: INITIAL  4.  Pt will be educated in self MLD to decrease left UE swelling prn Baseline:  Goal status: INITIAL   PLAN:  PT FREQUENCY: 2-3x/week  PT DURATION: 6 weeks  PLANNED INTERVENTIONS: 97164- PT Re-evaluation, 97110-Therapeutic exercises, 97530- Therapeutic activity, 97112- Neuromuscular re-education, 97535- Self Care, 16109- Manual therapy, [phone]- Orthotic Fit/training, Patient/Family education, and Joint mobilization  PLAN FOR  NEXT SESSION: Changed POC to 3x/wk as pt would like to do compression bandaging now as PT discussed with her at eval. How did she tolerate bandages? Measure for flat knit sleeve once reduced (may only need bandaging for 2 wks) and order if willing with self pay, STM left UT, axillary border of pectorals, initiate postural strength with band if tolerated or gentle isometrics if not, MLD to left UE and instruct pt   Waynette Buttery, PT 03/18/2024, 4:01 PM

## 2024-03-20 ENCOUNTER — Ambulatory Visit

## 2024-03-20 DIAGNOSIS — C50412 Malignant neoplasm of upper-outer quadrant of left female breast: Secondary | ICD-10-CM

## 2024-03-20 DIAGNOSIS — R293 Abnormal posture: Secondary | ICD-10-CM

## 2024-03-20 DIAGNOSIS — I890 Lymphedema, not elsewhere classified: Secondary | ICD-10-CM | POA: Diagnosis not present

## 2024-03-20 DIAGNOSIS — R600 Localized edema: Secondary | ICD-10-CM | POA: Diagnosis not present

## 2024-03-20 DIAGNOSIS — M25612 Stiffness of left shoulder, not elsewhere classified: Secondary | ICD-10-CM | POA: Diagnosis not present

## 2024-03-20 DIAGNOSIS — M25512 Pain in left shoulder: Secondary | ICD-10-CM

## 2024-03-20 NOTE — Therapy (Signed)
 OUTPATIENT PHYSICAL THERAPY  UPPER EXTREMITY ONCOLOGY TREATMENT  Patient Name: [name] MRN: [id number] DOB:[birth year], 78 y.o., female Today's Date: 03/20/2024  END OF SESSION:  PT End of Session - 03/20/24 1206     Visit Number 4    Number of Visits 18    Date for PT Re-Evaluation 04/22/24    Authorization Type Medicare    PT Start Time 1206    PT Stop Time 1315    PT Time Calculation (min) 69 min    Activity Tolerance Patient tolerated treatment well    Behavior During Therapy WFL for tasks assessed/performed             Past Medical History:  Diagnosis Date   Arthritis    hands and knees   Complication of anesthesia    aspiration pna; following a colonoscopy, pt requests head of bed elevated if possible.   Constipation    Cough    Depression    Dysrhythmia    RBBB on 06-06-17 ekg    Elevated liver function tests    Esophageal stricture    Family history of breast cancer    Gallstones    Genetic testing 04/02/2018   STAT Breast panel with reflex to Multi-Cancer panel (83 genes) @ [facility] - No pathogenic mutations detected   GERD (gastroesophageal reflux disease)    Hiatal hernia    History of kidney stones    History of radiation therapy 07/09/2018- 08/03/18   Left Breast/ 40.05 Gy in 15 fractions. Left Breast boost 10 Gy in 5 fractions.    Hyperlipemia    Hypothyroidism    Insomnia    Malignant neoplasm of upper-outer quadrant of left female breast (HCC)    Nephrolithiasis    Personal history of radiation therapy    Pneumonia 2015   aspirated after colonosopy   Renal cyst    Past Surgical History:  Procedure Laterality Date   BREAST BIOPSY Left 05/31/2019   mri   BREAST BIOPSY Left 04/2019   malignant   BREAST LUMPECTOMY Left 04/17/2018   malignant   BREAST LUMPECTOMY WITH RADIOACTIVE SEED AND SENTINEL LYMPH NODE BIOPSY Left 04/17/2018   Procedure: LEFT BREAST LUMPECTOMY WITH BRACKETED RADIOACTIVE SEEDS AND SENTINEL LYMPH NODE BIOPSY;  Surgeon:  Emelia Loron, MD;  Location: [facility];  Service: General;  Laterality: Left;   BREAST SURGERY Left    Lumpectomy   CARDIOVASCULAR STRESS TEST  07/19/2006   EF 70%, NO EVIDENCE OF ISCHEMIA   CHOLECYSTECTOMY  12/30/10   COLONOSCOPY     DILATION AND CURETTAGE OF UTERUS     HYSTEROSCOPY WITH D & C N/A 06/07/2018   Procedure: DILATATION AND CURETTAGE /HYSTEROSCOPY;  Surgeon: Marcelle Overlie, MD;  Location: [facility];  Service: Gynecology;  Laterality: N/A;   inguinal herniography     NASAL SINUS SURGERY     RADIOACTIVE SEED GUIDED EXCISIONAL BREAST BIOPSY Right 04/17/2018   Procedure: RIGHT BREAST SEED GUIDED EXCISIONAL BIOPSY;  Surgeon: Emelia Loron, MD;  Location: [facility];  Service: General;  Laterality: Right;   RE-EXCISION OF BREAST LUMPECTOMY Left 05/15/2018   Procedure: RE-EXCISION OF LEFT BREAST LUMPECTOMY, ASPIRATION OF LEFT AXILLARY SEROMA;  Surgeon: Emelia Loron, MD;  Location: [facility];  Service: General;  Laterality: Left;   T & A     TOOTH EXTRACTION     TOTAL KNEE ARTHROPLASTY Left 09/11/2017   Procedure: LEFT TOTAL KNEE ARTHROPLASTY;  Surgeon: Ollen Gross, MD;  Location: [facility];  Service: Orthopedics;  Laterality: Left;  with block   US ECHOCARDIOGRAPHY  07/17/2006   EF 55-60%   Patient Active Problem List   Diagnosis Date Noted   Other specified disorders of eustachian tube, right ear 11/15/2023   Chronic rhinitis 11/15/2023   Sensorineural hearing loss, bilateral 11/15/2023   Spinal stenosis of lumbar region with radiculopathy 10/12/2021   Chronic postoperative pain 10/12/2021   Breast mass, right 03/27/2020   Hepatic steatosis 01/22/2020   Genetic testing 04/02/2018   Family history of breast cancer    Malignant neoplasm of upper-outer quadrant of left breast in female, estrogen receptor positive (HCC) 03/16/2018   OA (osteoarthritis) of knee 09/11/2017   Infrapatellar bursitis of right knee 04/29/2014    Pneumonia, aspiration (HCC) 12/28/2012   HYPOTHYROIDISM 11/15/2010   HYPERLIPIDEMIA 11/15/2010   DEPRESSION 11/15/2010   GERD 11/15/2010   CONSTIPATION 11/15/2010   GALLSTONES 11/15/2010   RENAL CYST 11/15/2010   INSOMNIA UNSPECIFIED 11/15/2010   COUGH 11/15/2010   ABDOMINAL PAIN OTHER SPECIFIED SITE 11/15/2010   TRANSAMINASES, SERUM, ELEVATED 11/15/2010    REFERRING PROVIDER: Emelia Loron, MD  REFERRING DIAG: Left arm arm swelling  THERAPY DIAG:  Abnormal posture  Left shoulder pain, unspecified chronicity  Lymphedema, not elsewhere classified  ONSET DATE: 2-3 months ago  Rationale for Evaluation and Treatment: Rehabilitation  SUBJECTIVE:                                                                                                                                                                                           SUBJECTIVE STATEMENT:   I took the wrap off at 10:00 yesterday am. It was really tight, but it made my arm sore and red at the elbow and at my upper arm. Pt requested wrap not go up as high.  PERTINENT HISTORY:  04/17/2018 Bilateral lumpectomies with left SLNB with 0+/1 LN. Gr. 2 ILC Er/PR+, HER 2-, Ki 67 5%. Radiation 09/11/2018 -08/03/2018. Most recent prior PT treatment 04/05/2022 to 05/17/22 with this therapist. 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  PAIN:  Are you having pain? Yes NPRS scale: 3/10 Pain location: left upper arm Pain orientation: Left  PAIN TYPE: aching and dull, tight Pain description: constant  Aggravating factors: not sure, reaching, lying on left hurts shoulder Relieving factors: nothing.  PRECAUTIONS: Left TKA, sciatica, Lympedema(left), small brain aneurysm  RED FLAGS: None   WEIGHT BEARING RESTRICTIONS: No  FALLS:  Has patient fallen in last 6 months? No  LIVING ENVIRONMENT: Lives with: lives with their spouse Lives in: House/apartment OCCUPATION: retired   LEISURE: reading , gardening  HAND DOMINANCE: right  PRIOR LEVEL OF FUNCTION: Independent  PATIENT GOALS: ease the pain, move better, check swelling   OBJECTIVE: Note: Objective measures were completed at Evaluation unless otherwise noted.  COGNITION: Overall cognitive status: Within functional limits for tasks assessed   PALPATION: Tender at proximal biceps, supraspinatus, mid deltoid,tender left pectorals, breast incision on left.  OBSERVATIONS / OTHER ASSESSMENTS: positive left impingement, positive empty  SENSATION: Light touch: Appears intact   POSTURE: forward head, rounded shoulders  UPPER EXTREMITY AROM/PROM:  A/PROM RIGHT   eval   Shoulder extension 65  Shoulder flexion 120  Shoulder abduction 164, slight scaption  Shoulder internal rotation 75  Shoulder external rotation 90    (Blank rows = not tested)  A/PROM LEFT   eval  Shoulder extension 54, tight  Shoulder flexion 120, tight , mild pain at top of shoulder  Shoulder abduction 90  Shoulder internal rotation   Shoulder external rotation     (Blank rows = not tested)  CERVICAL AROM:  within functional limits:     UPPER EXTREMITY STRENGTH: functional but limited by pain on left  LYMPHEDEMA ASSESSMENTS:   SURGERY TYPE/DATE: 04/17/2018 Bilateral lumpectomies with Left SLNB, had a seroma on left  NUMBER OF LYMPH NODES REMOVED: 0+/1  CHEMOTHERAPY: NO  RADIATION:Yes   HORMONE TREATMENT: end 07/2018  INFECTIONS: NO   LYMPHEDEMA ASSESSMENTS:   LANDMARK RIGHT  eval  At axilla  33.0  15 cm proximal to olecranon process 32.3  10 cm proximal to olecranon process 30.8  Olecranon process 25.7  15 cm proximal to ulnar styloid process 23.3  10 cm proximal to ulnar styloid process 20.1  Just proximal to ulnar styloid process 14.2  Across hand at thumb web space 18.0  At base of 2nd digit 6.2  (Blank rows = not tested)  LANDMARK LEFT  eval Left 03/15/24 LEFT 03/20/2024  At axilla  33.7  32.3  15 cm proximal to olecranon process 33.5  33.3  10 cm  proximal to olecranon process 32.3  31.9  Olecranon process 26.8  26.7  15 cm proximal to ulnar styloid process 23.3  23.5  10 cm proximal to ulnar styloid process 20.0  20.1  Just proximal to ulnar styloid process 15.25 15.2 15.2  Across hand at thumb web space 18.4  17.8  At base of 2nd digit 6.1  6.0  (Blank rows = not tested)   FUNCTIONAL TESTS:    GAIT: flexed posture,increased kyphosis, independent     QUICK DASH SURVEY: 40.91                                                                                                                            TREATMENT DATE:  03/20/2024 STM to Lt axilla, pectoralis insertion and lateral trunk with cocoa butter PROM to left shoulder for flexion, scaption and shoulder abduction with VC's to get pt to relax MLD to Lt UE: Short neck, superficial and deep abdominals, Lt inguinal nodes and Lt axillo-inguinal  anastomosis, Rt axillary and pectoral nodes, anterior intact thorax sequence, and then anterior inter-axillary anastomosis, then Lt UE working from proximal to distal and retracing all steps back to anastomosis and continued educating pt about basics of anatomy of lymphatic system during MLD Compression Bandaging to Lt UE as follows: Cocoa butter to Lt UE, TG soft med, Mollelast to fingers 1-4, Artiflex to UE, and then 1-6 cm short stretch compression bandage to hand/wrist, 1-8 cm spiral from wrist to "X" at elbow, and then 1-10 cm spiral from wrist to  1 inch below axilla folding Tg soft over top of bandage.  03/18/2024 STM to Lt axilla, pectoralis insertion and lateral trunk with cocoa butter PROM to left shoulder for flexion, scaption and shoulder abduction with VC's to get pt to relax MLD to Lt UE: Short neck, superficial and deep abdominals, Lt inguinal nodes and Lt axillo-inguinal anastomosis, Rt axillary and pectoral nodes, anterior intact thorax sequence, and then anterior inter-axillary anastomosis, then Lt UE working from proximal to  distal and retracing all steps back to anastomosis and continued educating pt about basics of anatomy of lymphatic system during MLD Compression Bandaging to Lt UE as follows: Cocoa butter to Lt UE, TG soft med, Mollelast to fingers 1-4, New Artiflex to UE, and then 1-6 cm short stretch compression bandage to hand/wrist, 1-8 cm spiral from wrist to "X" at elbow, and then 1-10 cm spiral from wrist to axilla folding Tg soft over top of bandage. Pt was instructed in remedial exs to perform with bandages on and to remove when they start sliding down. Bandaging instructions for care and removal given. 03/15/24: Manual Therapy MLD to Lt UE: Short neck, superficial and deep abdominals, Lt inguinal nodes and Lt axillo-inguinal anastomosis, Rt axillary and pectoral nodes, anterior intact thorax sequence, and then anterior inter-axillary anastomosis, then Lt UE working from proximal to distal and retracing all steps back to anastomosis and began educating pt about basics of anatomy of lymphatic system during MLD P/ROM to Lt shoulder during MLD into flex, abd and D2 with scapular depression by therapist throughout which pt reported this feeling very good and her end P/ROM was much improved by end of session. STM to Lt axilla, pectoralis insertion and lateral trunk, all areas where pt is palpably very tight. This did soften very well by end of session  Compression Bandaging to Lt UE as follows: Cocoa butter to Lt UE, TG soft med, Mollelast to fingers 1-4, Artiflex to UE, and then 1-6 cm short stretch compression bandage to hand/wrist, 1-8 cm spiral from wrist to "X" at elbow, and then 1-10 cm spiral from wrist to axilla folding Tg soft over top of bandage. Pt was instructed in remedial exs to perform with bandages on and to remove when they start sliding down, possibly Sat evening or Sun morning and to bring all bandages back with her.   03/11/2024 Discussed bandaging vs sleeve and due to other medical concerns she  prefers not to bandage, but is willing to get a new sleeve as hers does not fit. We discussed manual techniques for tight and sore muscles in axilla/lateral arm, and the need to improve posture and strength to unload her left shoulder. Advised that if shoulder pain is not improving we will ask her to see an orthopedic MD.    PATIENT EDUCATION:  Education details: see above Person educated: Patient Education method: Explanation Education comprehension: verbalized understanding  HOME EXERCISE PROGRAM: NA  ASSESSMENT:  CLINICAL IMPRESSION: Pt was not  comfortable in wrap and removed it at 10;00 am the next morning saying it was too tight at the upper arm. Wrap was modified today and wrapped 1-1 and a half inches below the axilla. It was comfortable when she left. Advised again to exercise the arm if it is too tight  OBJECTIVE IMPAIRMENTS: decreased activity tolerance, decreased knowledge of condition, decreased ROM, decreased strength, increased edema, impaired UE functional use, postural dysfunction, and pain.   ACTIVITY LIMITATIONS: lifting, sleeping, dressing, and reach over head  PARTICIPATION LIMITATIONS: cleaning and laundry  PERSONAL FACTORS:  Left breast Cancer, radiation are also patient's functional outcome.   REHAB POTENTIAL: Good  CLINICAL DECISION MAKING: Evolving/moderate complexity  EVALUATION COMPLEXITY: Moderate  GOALS: Goals reviewed with patient? Yes  SHORT TERM GOALS: Target date: 04/01/2024  Pt will be independent and compliant with HEP for left shoulder ROM/strength  Baseline: Goal status: INITIAL  2.  Pt will be fit for appropriate flat knit compression sleeve to address left UE lymphedema Baseline:  Goal status: INITIAL  3.  Pt will have decreased left shoulder/axillary pain by 25% or more Baseline:  Goal status: INITIAL  4.  Quick dash will improve to greater than 30% to demonstrate improved function Baseline:  Goal status: INITIAL   LONG TERM  GOALS: Target date: 04/22/2024  Pt will have decreased complaints left shoulder/axillary pain  by 50%  Baseline:  Goal status: INITIAL  2.  Pt will have left shoulder ROM WNL as compared to right UE   Baseline:  Goal status: INITIAL  3.  Pts quick dash will be no greater than 22% to demonstrate improved function in left shoulder Baseline:  Goal status: INITIAL  4.  Pt will be educated in self MLD to decrease left UE swelling prn Baseline:  Goal status: INITIAL   PLAN:  PT FREQUENCY: 2-3x/week  PT DURATION: 6 weeks  PLANNED INTERVENTIONS: 97164- PT Re-evaluation, 97110-Therapeutic exercises, 97530- Therapeutic activity, 97112- Neuromuscular re-education, 97535- Self Care, 16109- Manual therapy, [id number]- Orthotic Fit/training, Patient/Family education, and Joint mobilization  PLAN FOR NEXT SESSION: Changed POC to 3x/wk as pt would like to do compression bandaging now as PT discussed with her at eval. How did she tolerate bandages? Measure for flat knit sleeve once reduced (may only need bandaging for 2 wks) and order if willing with self pay, STM left UT, axillary border of pectorals, initiate postural strength with band if tolerated or gentle isometrics if not, MLD to left UE and instruct pt   Waynette Buttery, PT 03/20/2024, 1:17 PM

## 2024-03-22 ENCOUNTER — Ambulatory Visit

## 2024-03-22 DIAGNOSIS — C50412 Malignant neoplasm of upper-outer quadrant of left female breast: Secondary | ICD-10-CM | POA: Diagnosis not present

## 2024-03-22 DIAGNOSIS — R600 Localized edema: Secondary | ICD-10-CM

## 2024-03-22 DIAGNOSIS — M25512 Pain in left shoulder: Secondary | ICD-10-CM | POA: Diagnosis not present

## 2024-03-22 DIAGNOSIS — I890 Lymphedema, not elsewhere classified: Secondary | ICD-10-CM | POA: Diagnosis not present

## 2024-03-22 DIAGNOSIS — M25612 Stiffness of left shoulder, not elsewhere classified: Secondary | ICD-10-CM | POA: Diagnosis not present

## 2024-03-22 DIAGNOSIS — R293 Abnormal posture: Secondary | ICD-10-CM | POA: Diagnosis not present

## 2024-03-22 DIAGNOSIS — Z170 Estrogen receptor positive status [ER+]: Secondary | ICD-10-CM

## 2024-03-22 NOTE — Therapy (Signed)
 OUTPATIENT PHYSICAL THERAPY  UPPER EXTREMITY ONCOLOGY TREATMENT  Patient Name: [name] MRN: [id number] DOB:[birth year], 78 y.o., female Today's Date: 03/22/2024  END OF SESSION:  PT End of Session - 03/22/24 0800     Visit Number 5    Number of Visits 18    Date for PT Re-Evaluation 04/22/24    Authorization Type Medicare    PT Start Time 0802    PT Stop Time 0902    PT Time Calculation (min) 60 min    Activity Tolerance Patient tolerated treatment well    Behavior During Therapy WFL for tasks assessed/performed             Past Medical History:  Diagnosis Date   Arthritis    hands and knees   Complication of anesthesia    aspiration pna; following a colonoscopy, pt requests head of bed elevated if possible.   Constipation    Cough    Depression    Dysrhythmia    RBBB on 06-06-17 ekg    Elevated liver function tests    Esophageal stricture    Family history of breast cancer    Gallstones    Genetic testing 04/02/2018   STAT Breast panel with reflex to Multi-Cancer panel (83 genes) @ [facility] - No pathogenic mutations detected   GERD (gastroesophageal reflux disease)    Hiatal hernia    History of kidney stones    History of radiation therapy 07/09/2018- 08/03/18   Left Breast/ 40.05 Gy in 15 fractions. Left Breast boost 10 Gy in 5 fractions.    Hyperlipemia    Hypothyroidism    Insomnia    Malignant neoplasm of upper-outer quadrant of left female breast (HCC)    Nephrolithiasis    Personal history of radiation therapy    Pneumonia 2015   aspirated after colonosopy   Renal cyst    Past Surgical History:  Procedure Laterality Date   BREAST BIOPSY Left 05/31/2019   mri   BREAST BIOPSY Left 04/2019   malignant   BREAST LUMPECTOMY Left 04/17/2018   malignant   BREAST LUMPECTOMY WITH RADIOACTIVE SEED AND SENTINEL LYMPH NODE BIOPSY Left 04/17/2018   Procedure: LEFT BREAST LUMPECTOMY WITH BRACKETED RADIOACTIVE SEEDS AND SENTINEL LYMPH NODE BIOPSY;   Surgeon: Emelia Loron, MD;  Location: [facility];  Service: General;  Laterality: Left;   BREAST SURGERY Left    Lumpectomy   CARDIOVASCULAR STRESS TEST  07/19/2006   EF 70%, NO EVIDENCE OF ISCHEMIA   CHOLECYSTECTOMY  12/30/10   COLONOSCOPY     DILATION AND CURETTAGE OF UTERUS     HYSTEROSCOPY WITH D & C N/A 06/07/2018   Procedure: DILATATION AND CURETTAGE /HYSTEROSCOPY;  Surgeon: Marcelle Overlie, MD;  Location: [facility];  Service: Gynecology;  Laterality: N/A;   inguinal herniography     NASAL SINUS SURGERY     RADIOACTIVE SEED GUIDED EXCISIONAL BREAST BIOPSY Right 04/17/2018   Procedure: RIGHT BREAST SEED GUIDED EXCISIONAL BIOPSY;  Surgeon: Emelia Loron, MD;  Location: [facility];  Service: General;  Laterality: Right;   RE-EXCISION OF BREAST LUMPECTOMY Left 05/15/2018   Procedure: RE-EXCISION OF LEFT BREAST LUMPECTOMY, ASPIRATION OF LEFT AXILLARY SEROMA;  Surgeon: Emelia Loron, MD;  Location: [facility];  Service: General;  Laterality: Left;   T & A     TOOTH EXTRACTION     TOTAL KNEE ARTHROPLASTY Left 09/11/2017   Procedure: LEFT TOTAL KNEE ARTHROPLASTY;  Surgeon: Ollen Gross, MD;  Location: [facility];  Service: Orthopedics;  Laterality: Left;  with block   US ECHOCARDIOGRAPHY  07/17/2006   EF 55-60%   Patient Active Problem List   Diagnosis Date Noted   Other specified disorders of eustachian tube, right ear 11/15/2023   Chronic rhinitis 11/15/2023   Sensorineural hearing loss, bilateral 11/15/2023   Spinal stenosis of lumbar region with radiculopathy 10/12/2021   Chronic postoperative pain 10/12/2021   Breast mass, right 03/27/2020   Hepatic steatosis 01/22/2020   Genetic testing 04/02/2018   Family history of breast cancer    Malignant neoplasm of upper-outer quadrant of left breast in female, estrogen receptor positive (HCC) 03/16/2018   OA (osteoarthritis) of knee 09/11/2017   Infrapatellar bursitis of right knee  04/29/2014   Pneumonia, aspiration (HCC) 12/28/2012   HYPOTHYROIDISM 11/15/2010   HYPERLIPIDEMIA 11/15/2010   DEPRESSION 11/15/2010   GERD 11/15/2010   CONSTIPATION 11/15/2010   GALLSTONES 11/15/2010   RENAL CYST 11/15/2010   INSOMNIA UNSPECIFIED 11/15/2010   COUGH 11/15/2010   ABDOMINAL PAIN OTHER SPECIFIED SITE 11/15/2010   TRANSAMINASES, SERUM, ELEVATED 11/15/2010    REFERRING PROVIDER: Emelia Loron, MD  REFERRING DIAG: Left arm arm swelling  THERAPY DIAG:  Abnormal posture  Left shoulder pain, unspecified chronicity  Lymphedema, not elsewhere classified  ONSET DATE: 2-3 months ago  Rationale for Evaluation and Treatment: Rehabilitation  SUBJECTIVE:                                                                                                                                                                                           SUBJECTIVE STATEMENT:   I  tolerated the wrap much better, but it still feels tight at the top and uncomfortable. My husband is going to call insurance to see if they cover garments but he needs the codes.  PERTINENT HISTORY:  04-23-2018 Bilateral lumpectomies with left SLNB with 0+/1 LN. Gr. 2 ILC Er/PR+, HER 2-, Ki 67 5%. Radiation 09/11/2018 -08/03/2018. Most recent prior PT treatment 04/05/2022 to 05/17/22 with this therapist. 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  PAIN:  Are you having pain? Yes NPRS scale: 4/10 upper arm where bandage ended Pain location: left upper arm Pain orientation: Left  PAIN TYPE: aching and dull, tight Pain description: constant  Aggravating factors: not sure, reaching, lying on left hurts shoulder Relieving factors: nothing.  PRECAUTIONS: Left TKA, sciatica, Lympedema(left), small brain aneurysm  RED FLAGS: None   WEIGHT BEARING RESTRICTIONS: No  FALLS:  Has patient fallen in last 6 months? No  LIVING ENVIRONMENT: Lives with: lives with their spouse Lives in: House/apartment OCCUPATION:  retired   LEISURE: reading , gardening  HAND DOMINANCE:  right   PRIOR LEVEL OF FUNCTION: Independent  PATIENT GOALS: ease the pain, move better, check swelling   OBJECTIVE: Note: Objective measures were completed at Evaluation unless otherwise noted.  COGNITION: Overall cognitive status: Within functional limits for tasks assessed   PALPATION: Tender at proximal biceps, supraspinatus, mid deltoid,tender left pectorals, breast incision on left.  OBSERVATIONS / OTHER ASSESSMENTS: positive left impingement, positive empty  SENSATION: Light touch: Appears intact   POSTURE: forward head, rounded shoulders  UPPER EXTREMITY AROM/PROM:  A/PROM RIGHT   eval   Shoulder extension 65  Shoulder flexion 120  Shoulder abduction 164, slight scaption  Shoulder internal rotation 75  Shoulder external rotation 90    (Blank rows = not tested)  A/PROM LEFT   eval  Shoulder extension 54, tight  Shoulder flexion 120, tight , mild pain at top of shoulder  Shoulder abduction 90  Shoulder internal rotation   Shoulder external rotation     (Blank rows = not tested)  CERVICAL AROM:  within functional limits:     UPPER EXTREMITY STRENGTH: functional but limited by pain on left  LYMPHEDEMA ASSESSMENTS:   SURGERY TYPE/DATE: 04/17/2018 Bilateral lumpectomies with Left SLNB, had a seroma on left  NUMBER OF LYMPH NODES REMOVED: 0+/1  CHEMOTHERAPY: NO  RADIATION:Yes   HORMONE TREATMENT: end 07/2018  INFECTIONS: NO   LYMPHEDEMA ASSESSMENTS:   LANDMARK RIGHT  eval  At axilla  33.0  15 cm proximal to olecranon process 32.3  10 cm proximal to olecranon process 30.8  Olecranon process 25.7  15 cm proximal to ulnar styloid process 23.3  10 cm proximal to ulnar styloid process 20.1  Just proximal to ulnar styloid process 14.2  Across hand at thumb web space 18.0  At base of 2nd digit 6.2  (Blank rows = not tested)  LANDMARK LEFT  eval Left 03/15/24 LEFT 03/20/2024  LEFT 11-Apr-2024  At axilla  33.7  32.3   15 cm proximal to olecranon process 33.5  33.3   10 cm proximal to olecranon process 32.3  31.9 30.7  Olecranon process 26.8  26.7 26.2  15 cm proximal to ulnar styloid process 23.3  23.5 23.5  10 cm proximal to ulnar styloid process 20.0  20.1 20.1  Just proximal to ulnar styloid process 15.25 15.2 15.2 15.0  Across hand at thumb web space 18.4  17.8   At base of 2nd digit 6.1  6.0 5.9  (Blank rows = not tested)   FUNCTIONAL TESTS:    GAIT: flexed posture,increased kyphosis, independent     QUICK DASH SURVEY: 40.91                                                                                                                            TREATMENT DATE:  2024/04/11 Codes for garments printed and given to pt at her request.Discussed the issues we are having with DME companies and the time it takes to get garments. Seems willing to pay  out of pocket to get sooner. Bandages removed and pts arm measured. Some reduction noted at upper arm.  Pt washed arm and lotion applied. Discussed bandaging with pt and she would like to try a different under sleeve than the TG soft.   MLD to Lt UE: Short neck, superficial and deep abdominals, Lt inguinal nodes and Lt axillo-inguinal anastomosis, Rt axillary nodes,  and then anterior inter-axillary anastomosis, then Lt UE working from proximal to distal and retracing all steps back to anastomosis and ending with LN's. Continued educating pt about basics of anatomy of lymphatic system during MLD and had pt try short neck, and anterior interaxillary pathway. She will need further review. Cut Juzo under sleeve/stockinette to use in place of TG soft as top of bandages always feels too tight for her. Compression Bandaging to Lt UE as follows: lotion to Lt UE,  Mollelast to fingers 1-4, Artiflex to UE, and then 1-6 cm short stretch compression bandage to hand/wrist, 1-8 cm spiral from wrist to "X" at elbow, and then 1-10 cm  spiral from wrist to 1/2 in below axilla folding juzo under sleeve over top of bandage.   03/20/2024 STM to Lt axilla, pectoralis insertion and lateral trunk with cocoa butter PROM to left shoulder for flexion, scaption and shoulder abduction with VC's to get pt to relax MLD to Lt UE: Short neck, superficial and deep abdominals, Lt inguinal nodes and Lt axillo-inguinal anastomosis, Rt axillary and pectoral nodes, anterior intact thorax sequence, and then anterior inter-axillary anastomosis, then Lt UE working from proximal to distal and retracing all steps back to anastomosis and continued educating pt about basics of anatomy of lymphatic system during MLD Compression Bandaging to Lt UE as follows: Cocoa butter to Lt UE, TG soft med, Mollelast to fingers 1-4, Artiflex to UE, and then 1-6 cm short stretch compression bandage to hand/wrist, 1-8 cm spiral from wrist to "X" at elbow, and then 1-10 cm spiral from wrist to  1 inch below axilla folding Tg soft over top of bandage.  03/18/2024 STM to Lt axilla, pectoralis insertion and lateral trunk with cocoa butter PROM to left shoulder for flexion, scaption and shoulder abduction with VC's to get pt to relax MLD to Lt UE: Short neck, superficial and deep abdominals, Lt inguinal nodes and Lt axillo-inguinal anastomosis, Rt axillary and pectoral nodes, anterior intact thorax sequence, and then anterior inter-axillary anastomosis, then Lt UE working from proximal to distal and retracing all steps back to anastomosis and continued educating pt about basics of anatomy of lymphatic system during MLD Compression Bandaging to Lt UE as follows: Cocoa butter to Lt UE, TG soft med, Mollelast to fingers 1-4, New Artiflex to UE, and then 1-6 cm short stretch compression bandage to hand/wrist, 1-8 cm spiral from wrist to "X" at elbow, and then 1-10 cm spiral from wrist to axilla folding Tg soft over top of bandage. Pt was instructed in remedial exs to perform with bandages  on and to remove when they start sliding down. Bandaging instructions for care and removal given. 03/15/24: Manual Therapy MLD to Lt UE: Short neck, superficial and deep abdominals, Lt inguinal nodes and Lt axillo-inguinal anastomosis, Rt axillary and pectoral nodes, anterior intact thorax sequence, and then anterior inter-axillary anastomosis, then Lt UE working from proximal to distal and retracing all steps back to anastomosis and began educating pt about basics of anatomy of lymphatic system during MLD P/ROM to Lt shoulder during MLD into flex, abd and D2 with scapular depression  by therapist throughout which pt reported this feeling very good and her end P/ROM was much improved by end of session. STM to Lt axilla, pectoralis insertion and lateral trunk, all areas where pt is palpably very tight. This did soften very well by end of session  Compression Bandaging to Lt UE as follows: Cocoa butter to Lt UE, TG soft med, Mollelast to fingers 1-4, Artiflex to UE, and then 1-6 cm short stretch compression bandage to hand/wrist, 1-8 cm spiral from wrist to "X" at elbow, and then 1-10 cm spiral from wrist to axilla folding Tg soft over top of bandage. Pt was instructed in remedial exs to perform with bandages on and to remove when they start sliding down, possibly Sat evening or Sun morning and to bring all bandages back with her.   03/11/2024 Discussed bandaging vs sleeve and due to other medical concerns she prefers not to bandage, but is willing to get a new sleeve as hers does not fit. We discussed manual techniques for tight and sore muscles in axilla/lateral arm, and the need to improve posture and strength to unload her left shoulder. Advised that if shoulder pain is not improving we will ask her to see an orthopedic MD.    PATIENT EDUCATION:  Education details: see above Person educated: Patient Education method: Explanation Education comprehension: verbalized understanding  HOME EXERCISE  PROGRAM: NA  ASSESSMENT:  CLINICAL IMPRESSION: Pt indicated she would likely be willing to pay for garments to get them sooner. Switched from TG soft under sleeve to Juzo under sleeve due to pt feeling the wraps have been too tight/painful at the top. She felt better with Juzo today but will assess next visit. Upper arm did reduce nicely today at 10 cm prox to olecranon by 1.6 cm. No time today for STM/PROM. Pt will require further instruction in MLD.  OBJECTIVE IMPAIRMENTS: decreased activity tolerance, decreased knowledge of condition, decreased ROM, decreased strength, increased edema, impaired UE functional use, postural dysfunction, and pain.   ACTIVITY LIMITATIONS: lifting, sleeping, dressing, and reach over head  PARTICIPATION LIMITATIONS: cleaning and laundry  PERSONAL FACTORS:  Left breast Cancer, radiation are also patient's functional outcome.   REHAB POTENTIAL: Good  CLINICAL DECISION MAKING: Evolving/moderate complexity  EVALUATION COMPLEXITY: Moderate  GOALS: Goals reviewed with patient? Yes  SHORT TERM GOALS: Target date: 04/01/2024  Pt will be independent and compliant with HEP for left shoulder ROM/strength  Baseline: Goal status: INITIAL  2.  Pt will be fit for appropriate flat knit compression sleeve to address left UE lymphedema Baseline:  Goal status: INITIAL  3.  Pt will have decreased left shoulder/axillary pain by 25% or more Baseline:  Goal status: INITIAL  4.  Quick dash will improve to greater than 30% to demonstrate improved function Baseline:  Goal status: INITIAL   LONG TERM GOALS: Target date: 04/22/2024  Pt will have decreased complaints left shoulder/axillary pain  by 50%  Baseline:  Goal status: INITIAL  2.  Pt will have left shoulder ROM WNL as compared to right UE   Baseline:  Goal status: INITIAL  3.  Pts quick dash will be no greater than 22% to demonstrate improved function in left shoulder Baseline:  Goal status:  INITIAL  4.  Pt will be educated in self MLD to decrease left UE swelling prn Baseline:  Goal status: INITIAL   PLAN:  PT FREQUENCY: 2-3x/week  PT DURATION: 6 weeks  PLANNED INTERVENTIONS: 97164- PT Re-evaluation, 97110-Therapeutic exercises, 97530- Therapeutic activity, [id number]- Neuromuscular  re-education, [phone]- Self Care, [id number]- Manual therapy, [phone]- Orthotic Fit/training, Patient/Family education, and Joint mobilization  PLAN FOR NEXT SESSION: Changed POC to 3x/wk as pt would like to do compression bandaging now as PT discussed with her at eval. How did she tolerate bandages? Was Juzo under sleeve better than TG soft,Measure for flat knit sleeve by Apr. 18 if ready. (may only need bandaging for 2 wks) and order if willing with self pay, STM left UT, axillary border of pectorals, initiate postural strength with band if tolerated or gentle isometrics if not, MLD to left UE and instruct pt   Waynette Buttery, PT 03/22/2024, 10:29 AM

## 2024-03-26 ENCOUNTER — Ambulatory Visit

## 2024-03-26 DIAGNOSIS — I890 Lymphedema, not elsewhere classified: Secondary | ICD-10-CM

## 2024-03-26 DIAGNOSIS — M25512 Pain in left shoulder: Secondary | ICD-10-CM

## 2024-03-26 DIAGNOSIS — C50412 Malignant neoplasm of upper-outer quadrant of left female breast: Secondary | ICD-10-CM

## 2024-03-26 DIAGNOSIS — R600 Localized edema: Secondary | ICD-10-CM | POA: Diagnosis not present

## 2024-03-26 DIAGNOSIS — M25612 Stiffness of left shoulder, not elsewhere classified: Secondary | ICD-10-CM | POA: Diagnosis not present

## 2024-03-26 DIAGNOSIS — R293 Abnormal posture: Secondary | ICD-10-CM

## 2024-03-26 NOTE — Therapy (Signed)
 OUTPATIENT PHYSICAL THERAPY  UPPER EXTREMITY ONCOLOGY TREATMENT  Patient Name: [name] MRN: [id number] DOB:23-Aug-1946, 78 y.o., female Today's Date: 03/26/2024  END OF SESSION:  PT End of Session - 03/26/24 1112     Visit Number 6    Number of Visits 18    Date for PT Re-Evaluation 04/22/24    Authorization Type Medicare    PT Start Time 1104    PT Stop Time 1200    PT Time Calculation (min) 56 min    Activity Tolerance Patient tolerated treatment well    Behavior During Therapy WFL for tasks assessed/performed             Past Medical History:  Diagnosis Date   Arthritis    hands and knees   Complication of anesthesia    aspiration pna; following a colonoscopy, pt requests head of bed elevated if possible.   Constipation    Cough    Depression    Dysrhythmia    RBBB on 06-06-17 ekg    Elevated liver function tests    Esophageal stricture    Family history of breast cancer    Gallstones    Genetic testing 04/02/2018   STAT Breast panel with reflex to Multi-Cancer panel (83 genes) @ [facility] - No pathogenic mutations detected   GERD (gastroesophageal reflux disease)    Hiatal hernia    History of kidney stones    History of radiation therapy 07/09/2018- 08/03/18   Left Breast/ 40.05 Gy in 15 fractions. Left Breast boost 10 Gy in 5 fractions.    Hyperlipemia    Hypothyroidism    Insomnia    Malignant neoplasm of upper-outer quadrant of left female breast (HCC)    Nephrolithiasis    Personal history of radiation therapy    Pneumonia 2015   aspirated after colonosopy   Renal cyst    Past Surgical History:  Procedure Laterality Date   BREAST BIOPSY Left 05/31/2019   mri   BREAST BIOPSY Left 04/2019   malignant   BREAST LUMPECTOMY Left 04/17/2018   malignant   BREAST LUMPECTOMY WITH RADIOACTIVE SEED AND SENTINEL LYMPH NODE BIOPSY Left 04/17/2018   Procedure: LEFT BREAST LUMPECTOMY WITH BRACKETED RADIOACTIVE SEEDS AND SENTINEL LYMPH NODE BIOPSY;   Surgeon: Enid Harry, MD;  Location: [facility];  Service: General;  Laterality: Left;   BREAST SURGERY Left    Lumpectomy   CARDIOVASCULAR STRESS TEST  07/19/2006   EF 70%, NO EVIDENCE OF ISCHEMIA   CHOLECYSTECTOMY  12/30/10   COLONOSCOPY     DILATION AND CURETTAGE OF UTERUS     HYSTEROSCOPY WITH D & C N/A 06/07/2018   Procedure: DILATATION AND CURETTAGE /HYSTEROSCOPY;  Surgeon: Thurman Flores, MD;  Location: [facility];  Service: Gynecology;  Laterality: N/A;   inguinal herniography     NASAL SINUS SURGERY     RADIOACTIVE SEED GUIDED EXCISIONAL BREAST BIOPSY Right 04/17/2018   Procedure: RIGHT BREAST SEED GUIDED EXCISIONAL BIOPSY;  Surgeon: Enid Harry, MD;  Location: [facility];  Service: General;  Laterality: Right;   RE-EXCISION OF BREAST LUMPECTOMY Left 05/15/2018   Procedure: RE-EXCISION OF LEFT BREAST LUMPECTOMY, ASPIRATION OF LEFT AXILLARY SEROMA;  Surgeon: Enid Harry, MD;  Location: [facility];  Service: General;  Laterality: Left;   T & A     TOOTH EXTRACTION     TOTAL KNEE ARTHROPLASTY Left 09/11/2017   Procedure: LEFT TOTAL KNEE ARTHROPLASTY;  Surgeon: Liliane Rei, MD;  Location: [facility];  Service: Orthopedics;  Laterality: Left;  with block   US ECHOCARDIOGRAPHY  07/17/2006   EF 55-60%   Patient Active Problem List   Diagnosis Date Noted   Other specified disorders of eustachian tube, right ear 11/15/2023   Chronic rhinitis 11/15/2023   Sensorineural hearing loss, bilateral 11/15/2023   Spinal stenosis of lumbar region with radiculopathy 10/12/2021   Chronic postoperative pain 10/12/2021   Breast mass, right 03/27/2020   Hepatic steatosis 01/22/2020   Genetic testing 04/02/2018   Family history of breast cancer    Malignant neoplasm of upper-outer quadrant of left breast in female, estrogen receptor positive (HCC) 03/16/2018   OA (osteoarthritis) of knee 09/11/2017   Infrapatellar bursitis of right knee  04/29/2014   Pneumonia, aspiration (HCC) 12/28/2012   HYPOTHYROIDISM 11/15/2010   HYPERLIPIDEMIA 11/15/2010   DEPRESSION 11/15/2010   GERD 11/15/2010   CONSTIPATION 11/15/2010   GALLSTONES 11/15/2010   RENAL CYST 11/15/2010   INSOMNIA UNSPECIFIED 11/15/2010   COUGH 11/15/2010   ABDOMINAL PAIN OTHER SPECIFIED SITE 11/15/2010   TRANSAMINASES, SERUM, ELEVATED 11/15/2010    REFERRING PROVIDER: Emelia Loron, MD  REFERRING DIAG: Left arm arm swelling  THERAPY DIAG:  Abnormal posture  Left shoulder pain, unspecified chronicity  Lymphedema, not elsewhere classified  ONSET DATE: 2-3 months ago  Rationale for Evaluation and Treatment: Rehabilitation  SUBJECTIVE:                                                                                                                                                                                           SUBJECTIVE STATEMENT:  I decided that I'll just pay for the first sleeve OOP when we are ready to measure me and then my insurance said I can get reimbursed and then order 2 more. The new stockinette she gave me last time felt wonderful under the bandage!  PERTINENT HISTORY:  04/17/2018 Bilateral lumpectomies with left SLNB with 0+/1 LN. Gr. 2 ILC Er/PR+, HER 2-, Ki 67 5%. Radiation 09/11/2018 -08/03/2018. Most recent prior PT treatment 04/05/2022 to 05/17/22 with this therapist. 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  PAIN:  Are you having pain? No, not today  PRECAUTIONS: Left TKA, sciatica, Lympedema(left), small brain aneurysm  RED FLAGS: None   WEIGHT BEARING RESTRICTIONS: No  FALLS:  Has patient fallen in last 6 months? No  LIVING ENVIRONMENT: Lives with: lives with their spouse Lives in: House/apartment OCCUPATION: retired   LEISURE: reading , gardening  HAND DOMINANCE: right   PRIOR LEVEL OF FUNCTION: Independent  PATIENT GOALS: ease the pain, move better, check swelling   OBJECTIVE: Note: Objective measures were  completed at Evaluation unless  otherwise noted.  COGNITION: Overall cognitive status: Within functional limits for tasks assessed   PALPATION: Tender at proximal biceps, supraspinatus, mid deltoid,tender left pectorals, breast incision on left.  OBSERVATIONS / OTHER ASSESSMENTS: positive left impingement, positive empty  SENSATION: Light touch: Appears intact   POSTURE: forward head, rounded shoulders  UPPER EXTREMITY AROM/PROM:  A/PROM RIGHT   eval   Shoulder extension 65  Shoulder flexion 120  Shoulder abduction 164, slight scaption  Shoulder internal rotation 75  Shoulder external rotation 90    (Blank rows = not tested)  A/PROM LEFT   eval  Shoulder extension 54, tight  Shoulder flexion 120, tight , mild pain at top of shoulder  Shoulder abduction 90  Shoulder internal rotation   Shoulder external rotation     (Blank rows = not tested)  CERVICAL AROM:  within functional limits:     UPPER EXTREMITY STRENGTH: functional but limited by pain on left  LYMPHEDEMA ASSESSMENTS:   SURGERY TYPE/DATE: 04/17/2018 Bilateral lumpectomies with Left SLNB, had a seroma on left  NUMBER OF LYMPH NODES REMOVED: 0+/1  CHEMOTHERAPY: NO  RADIATION:Yes   HORMONE TREATMENT: end 07/2018  INFECTIONS: NO   LYMPHEDEMA ASSESSMENTS:   LANDMARK RIGHT  eval  At axilla  33.0  15 cm proximal to olecranon process 32.3  10 cm proximal to olecranon process 30.8  Olecranon process 25.7  15 cm proximal to ulnar styloid process 23.3  10 cm proximal to ulnar styloid process 20.1  Just proximal to ulnar styloid process 14.2  Across hand at thumb web space 18.0  At base of 2nd digit 6.2  (Blank rows = not tested)  LANDMARK LEFT  eval Left 03/15/24 LEFT 03/20/2024 LEFT 03/22/2024  At axilla  33.7  32.3   15 cm proximal to olecranon process 33.5  33.3   10 cm proximal to olecranon process 32.3  31.9 30.7  Olecranon process 26.8  26.7 26.2  15 cm proximal to ulnar styloid process 23.3   23.5 23.5  10 cm proximal to ulnar styloid process 20.0  20.1 20.1  Just proximal to ulnar styloid process 15.25 15.2 15.2 15.0  Across hand at thumb web space 18.4  17.8   At base of 2nd digit 6.1  6.0 5.9  (Blank rows = not tested)   FUNCTIONAL TESTS:    GAIT: flexed posture,increased kyphosis, independent     QUICK DASH SURVEY: 40.91                                                                                                                            TREATMENT DATE:  03/26/24: Manual Therapy MLD to Lt UE: Short neck, superficial and deep abdominals, Lt inguinal nodes and Lt axillo-inguinal anastomosis, Rt axillary and pectoral nodes, anterior intact thorax sequence, and then anterior inter-axillary anastomosis, then Lt UE working from proximal to distal and retracing all steps back to anastomosis and ending with LN's.  P/ROM to Lt shoulder into flex, abd  and D2 with scapular depression by therapist throughout.  STM to Lt axilla at scar tissue. This was very hypersensitive but this improved well by end of session.  Compression Bandaging to Lt UE as follows: lotion to Lt UE,  Juzo undersleeve, Mollelast to fingers 1-4, Artiflex to UE, and then 1-6 cm short stretch compression bandage to hand/wrist, 1-8 cm spiral from wrist to "X" at elbow, and then 1-10 cm spiral from wrist to 1/2 in below axilla folding juzo under sleeve over top of bandage. Pt reports this feeling very comfortable.   15-Apr-2024 Codes for garments printed and given to pt at her request.Discussed the issues we are having with DME companies and the time it takes to get garments. Seems willing to pay out of pocket to get sooner. Bandages removed and pts arm measured. Some reduction noted at upper arm.  Pt washed arm and lotion applied. Discussed bandaging with pt and she would like to try a different under sleeve than the TG soft.   MLD to Lt UE: Short neck, superficial and deep abdominals, Lt inguinal nodes and Lt  axillo-inguinal anastomosis, Rt axillary nodes,  and then anterior inter-axillary anastomosis, then Lt UE working from proximal to distal and retracing all steps back to anastomosis and ending with LN's. Continued educating pt about basics of anatomy of lymphatic system during MLD and had pt try short neck, and anterior interaxillary pathway. She will need further review. Cut Juzo under sleeve/stockinette to use in place of TG soft as top of bandages always feels too tight for her. Compression Bandaging to Lt UE as follows: lotion to Lt UE,  Mollelast to fingers 1-4, Artiflex to UE, and then 1-6 cm short stretch compression bandage to hand/wrist, 1-8 cm spiral from wrist to "X" at elbow, and then 1-10 cm spiral from wrist to 1/2 in below axilla folding juzo under sleeve over top of bandage.   03/20/2024 STM to Lt axilla, pectoralis insertion and lateral trunk with cocoa butter PROM to left shoulder for flexion, scaption and shoulder abduction with VC's to get pt to relax MLD to Lt UE: Short neck, superficial and deep abdominals, Lt inguinal nodes and Lt axillo-inguinal anastomosis, Rt axillary and pectoral nodes, anterior intact thorax sequence, and then anterior inter-axillary anastomosis, then Lt UE working from proximal to distal and retracing all steps back to anastomosis and continued educating pt about basics of anatomy of lymphatic system during MLD Compression Bandaging to Lt UE as follows: Cocoa butter to Lt UE, TG soft med, Mollelast to fingers 1-4, Artiflex to UE, and then 1-6 cm short stretch compression bandage to hand/wrist, 1-8 cm spiral from wrist to "X" at elbow, and then 1-10 cm spiral from wrist to  1 inch below axilla folding Tg soft over top of bandage.  03/18/2024 STM to Lt axilla, pectoralis insertion and lateral trunk with cocoa butter PROM to left shoulder for flexion, scaption and shoulder abduction with VC's to get pt to relax MLD to Lt UE: Short neck, superficial and deep  abdominals, Lt inguinal nodes and Lt axillo-inguinal anastomosis, Rt axillary and pectoral nodes, anterior intact thorax sequence, and then anterior inter-axillary anastomosis, then Lt UE working from proximal to distal and retracing all steps back to anastomosis and continued educating pt about basics of anatomy of lymphatic system during MLD Compression Bandaging to Lt UE as follows: Cocoa butter to Lt UE, TG soft med, Mollelast to fingers 1-4, New Artiflex to UE, and then 1-6 cm short stretch compression bandage  to hand/wrist, 1-8 cm spiral from wrist to "X" at elbow, and then 1-10 cm spiral from wrist to axilla folding Tg soft over top of bandage. Pt was instructed in remedial exs to perform with bandages on and to remove when they start sliding down. Bandaging instructions for care and removal given      PATIENT EDUCATION:  Education details: see above Person educated: Patient Education method: Explanation Education comprehension: verbalized understanding  HOME EXERCISE PROGRAM: NA  ASSESSMENT:  CLINICAL IMPRESSION: Pt reports would like to wrap at least one more time before being measured for compression garments. Next session will measure pt and see if she fits in an [place] or [employer] and have her place order through [employer].   OBJECTIVE IMPAIRMENTS: decreased activity tolerance, decreased knowledge of condition, decreased ROM, decreased strength, increased edema, impaired UE functional use, postural dysfunction, and pain.   ACTIVITY LIMITATIONS: lifting, sleeping, dressing, and reach over head  PARTICIPATION LIMITATIONS: cleaning and laundry  PERSONAL FACTORS:  Left breast Cancer, radiation are also patient's functional outcome.   REHAB POTENTIAL: Good  CLINICAL DECISION MAKING: Evolving/moderate complexity  EVALUATION COMPLEXITY: Moderate  GOALS: Goals reviewed with patient? Yes  SHORT TERM GOALS: Target date: 04/01/2024  Pt will be independent and  compliant with HEP for left shoulder ROM/strength  Baseline: Goal status: INITIAL  2.  Pt will be fit for appropriate flat knit compression sleeve to address left UE lymphedema Baseline:  Goal status: INITIAL  3.  Pt will have decreased left shoulder/axillary pain by 25% or more Baseline:  Goal status: INITIAL  4.  Quick dash will improve to greater than 30% to demonstrate improved function Baseline:  Goal status: INITIAL   LONG TERM GOALS: Target date: 04/22/2024  Pt will have decreased complaints left shoulder/axillary pain  by 50%  Baseline:  Goal status: INITIAL  2.  Pt will have left shoulder ROM WNL as compared to right UE   Baseline:  Goal status: INITIAL  3.  Pts quick dash will be no greater than 22% to demonstrate improved function in left shoulder Baseline:  Goal status: INITIAL  4.  Pt will be educated in self MLD to decrease left UE swelling prn Baseline:  Goal status: INITIAL   PLAN:  PT FREQUENCY: 2-3x/week  PT DURATION: 6 weeks  PLANNED INTERVENTIONS: 97164- PT Re-evaluation, 97110-Therapeutic exercises, 97530- Therapeutic activity, 97112- Neuromuscular re-education, 97535- Self Care, 69629- Manual therapy, 97760- Orthotic Fit/training, Patient/Family education, and Joint mobilization  PLAN FOR NEXT SESSION: Measure for flat knit next (fit exostrong or mondi esprit?)  Re measure circumference next. Cont STM left UT, axillary border of pectorals, initiate postural strength with band if tolerated or gentle isometrics if not, MLD to left UE and instruct pt   Denyce Flank, PTA 03/26/2024, 1:23 PM

## 2024-03-27 ENCOUNTER — Encounter: Admitting: Physical Therapy

## 2024-03-28 ENCOUNTER — Ambulatory Visit: Admitting: Physical Therapy

## 2024-03-28 DIAGNOSIS — M25512 Pain in left shoulder: Secondary | ICD-10-CM

## 2024-03-28 DIAGNOSIS — R600 Localized edema: Secondary | ICD-10-CM | POA: Diagnosis not present

## 2024-03-28 DIAGNOSIS — I890 Lymphedema, not elsewhere classified: Secondary | ICD-10-CM | POA: Diagnosis not present

## 2024-03-28 DIAGNOSIS — M25612 Stiffness of left shoulder, not elsewhere classified: Secondary | ICD-10-CM | POA: Diagnosis not present

## 2024-03-28 DIAGNOSIS — C50412 Malignant neoplasm of upper-outer quadrant of left female breast: Secondary | ICD-10-CM | POA: Diagnosis not present

## 2024-03-28 DIAGNOSIS — R293 Abnormal posture: Secondary | ICD-10-CM | POA: Diagnosis not present

## 2024-03-28 NOTE — Therapy (Signed)
 OUTPATIENT PHYSICAL THERAPY  UPPER EXTREMITY ONCOLOGY TREATMENT  Patient Name: [name] MRN: [id number] DOB:1946-12-10, 78 y.o., female Today's Date: 03/28/2024  END OF SESSION:  PT End of Session - 03/28/24 1215     Visit Number 7    Number of Visits 18    Date for PT Re-Evaluation 04/22/24    PT Start Time 1100    PT Stop Time 1215    PT Time Calculation (min) 75 min    Activity Tolerance Patient tolerated treatment well    Behavior During Therapy WFL for tasks assessed/performed             Past Medical History:  Diagnosis Date   Arthritis    hands and knees   Complication of anesthesia    aspiration pna; following a colonoscopy, pt requests head of bed elevated if possible.   Constipation    Cough    Depression    Dysrhythmia    RBBB on 06-06-17 ekg    Elevated liver function tests    Esophageal stricture    Family history of breast cancer    Gallstones    Genetic testing 04/02/2018   STAT Breast panel with reflex to Multi-Cancer panel (83 genes) @ [facility] - No pathogenic mutations detected   GERD (gastroesophageal reflux disease)    Hiatal hernia    History of kidney stones    History of radiation therapy 07/09/2018- 08/03/18   Left Breast/ 40.05 Gy in 15 fractions. Left Breast boost 10 Gy in 5 fractions.    Hyperlipemia    Hypothyroidism    Insomnia    Malignant neoplasm of upper-outer quadrant of left female breast (HCC)    Nephrolithiasis    Personal history of radiation therapy    Pneumonia 2015   aspirated after colonosopy   Renal cyst    Past Surgical History:  Procedure Laterality Date   BREAST BIOPSY Left 05/31/2019   mri   BREAST BIOPSY Left 04/2019   malignant   BREAST LUMPECTOMY Left 04/17/2018   malignant   BREAST LUMPECTOMY WITH RADIOACTIVE SEED AND SENTINEL LYMPH NODE BIOPSY Left 04/17/2018   Procedure: LEFT BREAST LUMPECTOMY WITH BRACKETED RADIOACTIVE SEEDS AND SENTINEL LYMPH NODE BIOPSY;  Surgeon: Enid Harry, MD;   Location: [facility];  Service: General;  Laterality: Left;   BREAST SURGERY Left    Lumpectomy   CARDIOVASCULAR STRESS TEST  07/19/2006   EF 70%, NO EVIDENCE OF ISCHEMIA   CHOLECYSTECTOMY  12/30/10   COLONOSCOPY     DILATION AND CURETTAGE OF UTERUS     HYSTEROSCOPY WITH D & C N/A 06/07/2018   Procedure: DILATATION AND CURETTAGE /HYSTEROSCOPY;  Surgeon: Thurman Flores, MD;  Location: [facility];  Service: Gynecology;  Laterality: N/A;   inguinal herniography     NASAL SINUS SURGERY     RADIOACTIVE SEED GUIDED EXCISIONAL BREAST BIOPSY Right 04/17/2018   Procedure: RIGHT BREAST SEED GUIDED EXCISIONAL BIOPSY;  Surgeon: Enid Harry, MD;  Location: [facility];  Service: General;  Laterality: Right;   RE-EXCISION OF BREAST LUMPECTOMY Left 05/15/2018   Procedure: RE-EXCISION OF LEFT BREAST LUMPECTOMY, ASPIRATION OF LEFT AXILLARY SEROMA;  Surgeon: Enid Harry, MD;  Location: [facility];  Service: General;  Laterality: Left;   T & A     TOOTH EXTRACTION     TOTAL KNEE ARTHROPLASTY Left 09/11/2017   Procedure: LEFT TOTAL KNEE ARTHROPLASTY;  Surgeon: Liliane Rei, MD;  Location: [facility];  Service: Orthopedics;  Laterality: Left;  with block   US ECHOCARDIOGRAPHY  07/17/2006   EF 55-60%   Patient Active Problem List   Diagnosis Date Noted   Other specified disorders of eustachian tube, right ear 11/15/2023   Chronic rhinitis 11/15/2023   Sensorineural hearing loss, bilateral 11/15/2023   Spinal stenosis of lumbar region with radiculopathy 10/12/2021   Chronic postoperative pain 10/12/2021   Breast mass, right 03/27/2020   Hepatic steatosis 01/22/2020   Genetic testing 04/02/2018   Family history of breast cancer    Malignant neoplasm of upper-outer quadrant of left breast in female, estrogen receptor positive (HCC) 03/16/2018   OA (osteoarthritis) of knee 09/11/2017   Infrapatellar bursitis of right knee 04/29/2014   Pneumonia, aspiration (HCC)  12/28/2012   HYPOTHYROIDISM 11/15/2010   HYPERLIPIDEMIA 11/15/2010   DEPRESSION 11/15/2010   GERD 11/15/2010   CONSTIPATION 11/15/2010   GALLSTONES 11/15/2010   RENAL CYST 11/15/2010   INSOMNIA UNSPECIFIED 11/15/2010   COUGH 11/15/2010   ABDOMINAL PAIN OTHER SPECIFIED SITE 11/15/2010   TRANSAMINASES, SERUM, ELEVATED 11/15/2010    REFERRING PROVIDER: Emelia Loron, MD  REFERRING DIAG: Left arm arm swelling  THERAPY DIAG:  Abnormal posture  Left shoulder pain, unspecified chronicity  Lymphedema, not elsewhere classified  ONSET DATE: 2-3 months ago  Rationale for Evaluation and Treatment: Rehabilitation  SUBJECTIVE:                                                                                                                                                                                           SUBJECTIVE STATEMENT:  Pt comes in with bandages intact. She is ready to be measured today for compression sleeve and gauntlet  PERTINENT HISTORY:  04/17/2018 Bilateral lumpectomies with left SLNB with 0+/1 LN. Gr. 2 ILC Er/PR+, HER 2-, Ki 67 5%. Radiation 09/11/2018 -08/03/2018. Most recent prior PT treatment 04/05/2022 to 05/17/22 with this therapist. 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  PAIN:  Are you having pain? No, not today  PRECAUTIONS: Left TKA, sciatica, Lympedema(left), small brain aneurysm  RED FLAGS: None   WEIGHT BEARING RESTRICTIONS: No  FALLS:  Has patient fallen in last 6 months? No  LIVING ENVIRONMENT: Lives with: lives with their spouse Lives in: House/apartment OCCUPATION: retired   LEISURE: reading , gardening  HAND DOMINANCE: right   PRIOR LEVEL OF FUNCTION: Independent  PATIENT GOALS: ease the pain, move better, check swelling   OBJECTIVE: Note: Objective measures were completed at Evaluation unless otherwise noted.  COGNITION: Overall cognitive status: Within functional limits for tasks assessed   PALPATION: Tender at proximal biceps,  supraspinatus, mid deltoid,tender left pectorals, breast incision on left.  OBSERVATIONS / OTHER  ASSESSMENTS: positive left impingement, positive empty  SENSATION: Light touch: Appears intact   POSTURE: forward head, rounded shoulders  UPPER EXTREMITY AROM/PROM:  A/PROM RIGHT   eval   Shoulder extension 65  Shoulder flexion 120  Shoulder abduction 164, slight scaption  Shoulder internal rotation 75  Shoulder external rotation 90    (Blank rows = not tested)  A/PROM LEFT   eval  Shoulder extension 54, tight  Shoulder flexion 120, tight , mild pain at top of shoulder  Shoulder abduction 90  Shoulder internal rotation   Shoulder external rotation     (Blank rows = not tested)  CERVICAL AROM:  within functional limits:     UPPER EXTREMITY STRENGTH: functional but limited by pain on left  LYMPHEDEMA ASSESSMENTS:   SURGERY TYPE/DATE: 04/17/2018 Bilateral lumpectomies with Left SLNB, had a seroma on left  NUMBER OF LYMPH NODES REMOVED: 0+/1  CHEMOTHERAPY: NO  RADIATION:Yes   HORMONE TREATMENT: end 07/2018  INFECTIONS: NO   LYMPHEDEMA ASSESSMENTS:   LANDMARK RIGHT  eval  At axilla  33.0  15 cm proximal to olecranon process 32.3  10 cm proximal to olecranon process 30.8  Olecranon process 25.7  15 cm proximal to ulnar styloid process 23.3  10 cm proximal to ulnar styloid process 20.1  Just proximal to ulnar styloid process 14.2  Across hand at thumb web space 18.0  At base of 2nd digit 6.2  (Blank rows = not tested)  LANDMARK LEFT  eval Left 03/15/24 LEFT 03/20/2024 LEFT 03/22/2024  At axilla  33.7  32.3   15 cm proximal to olecranon process 33.5  33.3   10 cm proximal to olecranon process 32.3  31.9 30.7  Olecranon process 26.8  26.7 26.2  15 cm proximal to ulnar styloid process 23.3  23.5 23.5  10 cm proximal to ulnar styloid process 20.0  20.1 20.1  Just proximal to ulnar styloid process 15.25 15.2 15.2 15.0  Across hand at thumb web space 18.4  17.8    At base of 2nd digit 6.1  6.0 5.9  (Blank rows = not tested)   FUNCTIONAL TESTS:    GAIT: flexed posture,increased kyphosis, independent     QUICK DASH SURVEY: 40.91                                                                                                                            TREATMENT DATE:  03/28/2024 Orhotic fit :Measured for exostrong sleeve and gauntlet and assisted with order through Compression [employer] 18. Wrist 16, elbow 27.5, axilla 34  size medium ave sleeve and small gauntlet  Manual therapy : MLD to Lt UE: Short neck, superficial and deep abdominals, Lt inguinal nodes and Lt axillo-inguinal anastomosis, Rt axillary and pectoral nodes, anterior intact thorax sequence, and then anterior inter-axillary anastomosis, then Lt UE working from proximal to distal and retracing all steps back to anastomosis and ending with LN's.  P/ROM to Lt shoulder into flex,  abd and D2 with scapular depression by therapist throughout.  STM to Lt axilla at scar tissue. This was very hypersensitive but this improved well by end of session.  Compression Bandaging to Lt UE as follows: lotion to Lt UE,  Juzo undersleeve, Mollelast to fingers 1-4, Artiflex to UE, and then 1-6 cm short stretch compression bandage to hand/wrist, 1-8 cm spiral from wrist to "X" at elbow, and then 1-10 cm spiral from wrist to 1/2 in below axilla folding juzo under sleeve over top of bandage. Pt reports this feeling very comfortable.   03/26/24: Manual Therapy MLD to Lt UE: Short neck, superficial and deep abdominals, Lt inguinal nodes and Lt axillo-inguinal anastomosis, Rt axillary and pectoral nodes, anterior intact thorax sequence, and then anterior inter-axillary anastomosis, then Lt UE working from proximal to distal and retracing all steps back to anastomosis and ending with LN's.  P/ROM to Lt shoulder into flex, abd and D2 with scapular depression by therapist throughout.  STM to Lt axilla at scar tissue.  This was very hypersensitive but this improved well by end of session.  Compression Bandaging to Lt UE as follows: lotion to Lt UE,  Juzo undersleeve, Mollelast to fingers 1-4, Artiflex to UE, and then 1-6 cm short stretch compression bandage to hand/wrist, 1-8 cm spiral from wrist to "X" at elbow, and then 1-10 cm spiral from wrist to 1/2 in below axilla folding juzo under sleeve over top of bandage. Pt reports this feeling very comfortable.   2024-04-06 Codes for garments printed and given to pt at her request.Discussed the issues we are having with DME companies and the time it takes to get garments. Seems willing to pay out of pocket to get sooner. Bandages removed and pts arm measured. Some reduction noted at upper arm.  Pt washed arm and lotion applied. Discussed bandaging with pt and she would like to try a different under sleeve than the TG soft.   MLD to Lt UE: Short neck, superficial and deep abdominals, Lt inguinal nodes and Lt axillo-inguinal anastomosis, Rt axillary nodes,  and then anterior inter-axillary anastomosis, then Lt UE working from proximal to distal and retracing all steps back to anastomosis and ending with LN's. Continued educating pt about basics of anatomy of lymphatic system during MLD and had pt try short neck, and anterior interaxillary pathway. She will need further review. Cut Juzo under sleeve/stockinette to use in place of TG soft as top of bandages always feels too tight for her. Compression Bandaging to Lt UE as follows: lotion to Lt UE,  Mollelast to fingers 1-4, Artiflex to UE, and then 1-6 cm short stretch compression bandage to hand/wrist, 1-8 cm spiral from wrist to "X" at elbow, and then 1-10 cm spiral from wrist to 1/2 in below axilla folding juzo under sleeve over top of bandage.   03/20/2024 STM to Lt axilla, pectoralis insertion and lateral trunk with cocoa butter PROM to left shoulder for flexion, scaption and shoulder abduction with VC's to get pt to  relax MLD to Lt UE: Short neck, superficial and deep abdominals, Lt inguinal nodes and Lt axillo-inguinal anastomosis, Rt axillary and pectoral nodes, anterior intact thorax sequence, and then anterior inter-axillary anastomosis, then Lt UE working from proximal to distal and retracing all steps back to anastomosis and continued educating pt about basics of anatomy of lymphatic system during MLD Compression Bandaging to Lt UE as follows: Cocoa butter to Lt UE, TG soft med, Mollelast to fingers 1-4, Artiflex to UE, and  then 1-6 cm short stretch compression bandage to hand/wrist, 1-8 cm spiral from wrist to "X" at elbow, and then 1-10 cm spiral from wrist to  1 inch below axilla folding Tg soft over top of bandage.  03/18/2024 STM to Lt axilla, pectoralis insertion and lateral trunk with cocoa butter PROM to left shoulder for flexion, scaption and shoulder abduction with VC's to get pt to relax MLD to Lt UE: Short neck, superficial and deep abdominals, Lt inguinal nodes and Lt axillo-inguinal anastomosis, Rt axillary and pectoral nodes, anterior intact thorax sequence, and then anterior inter-axillary anastomosis, then Lt UE working from proximal to distal and retracing all steps back to anastomosis and continued educating pt about basics of anatomy of lymphatic system during MLD Compression Bandaging to Lt UE as follows: Cocoa butter to Lt UE, TG soft med, Mollelast to fingers 1-4, New Artiflex to UE, and then 1-6 cm short stretch compression bandage to hand/wrist, 1-8 cm spiral from wrist to "X" at elbow, and then 1-10 cm spiral from wrist to axilla folding Tg soft over top of bandage. Pt was instructed in remedial exs to perform with bandages on and to remove when they start sliding down. Bandaging instructions for care and removal given      PATIENT EDUCATION:  Education details: see above Person educated: Patient Education method: Explanation Education comprehension: verbalized  understanding  HOME EXERCISE PROGRAM: NA  ASSESSMENT:  CLINICAL IMPRESSION: Pt had good reduction from bandaging especially in wrist and hand.  Measurement went well, sleeve and gauntlet ordered and will be delivered to home Pt with some palpable softening in upper arm and forearm with MLD.  Pt is progressing well  OBJECTIVE IMPAIRMENTS: decreased activity tolerance, decreased knowledge of condition, decreased ROM, decreased strength, increased edema, impaired UE functional use, postural dysfunction, and pain.   ACTIVITY LIMITATIONS: lifting, sleeping, dressing, and reach over head  PARTICIPATION LIMITATIONS: cleaning and laundry  PERSONAL FACTORS:  Left breast Cancer, radiation are also patient's functional outcome.   REHAB POTENTIAL: Good  CLINICAL DECISION MAKING: Evolving/moderate complexity  EVALUATION COMPLEXITY: Moderate  GOALS: Goals reviewed with patient? Yes  SHORT TERM GOALS: Target date: 04/01/2024  Pt will be independent and compliant with HEP for left shoulder ROM/strength  Baseline: Goal status: INITIAL  2.  Pt will be fit for appropriate flat knit compression sleeve to address left UE lymphedema Baseline:  Goal status: INITIAL  3.  Pt will have decreased left shoulder/axillary pain by 25% or more Baseline:  Goal status: INITIAL  4.  Quick dash will improve to greater than 30% to demonstrate improved function Baseline:  Goal status: INITIAL   LONG TERM GOALS: Target date: 04/22/2024  Pt will have decreased complaints left shoulder/axillary pain  by 50%  Baseline:  Goal status: INITIAL  2.  Pt will have left shoulder ROM WNL as compared to right UE   Baseline:  Goal status: INITIAL  3.  Pts quick dash will be no greater than 22% to demonstrate improved function in left shoulder Baseline:  Goal status: INITIAL  4.  Pt will be educated in self MLD to decrease left UE swelling prn Baseline:  Goal status: INITIAL   PLAN:  PT FREQUENCY:  2-3x/week  PT DURATION: 6 weeks  PLANNED INTERVENTIONS: 97164- PT Re-evaluation, 97110-Therapeutic exercises, 97530- Therapeutic activity, 97112- Neuromuscular re-education, 97535- Self Care, 13244- Manual therapy, [phone]- Orthotic Fit/training, Patient/Family education, and Joint mobilization  PLAN FOR NEXT SESSION: check sleeve/gauntlet when it arrives.Re measure circumference next. Cont STM left  UT, axillary border of pectorals, initiate postural strength with band if tolerated or gentle isometrics if not, MLD to left UE and instruct pt  Bayard Hugger. Manson Passey PT  Donnetta Hail, PT 03/28/2024, 12:19 PM  OUTPATIENT PHYSICAL THERAPY  UPPER EXTREMITY ONCOLOGY TREATMENT  Patient Name: [name] MRN: [id number] DOB:05-16-1946, 78 y.o., female Today's Date: 03/28/2024  END OF SESSION:  PT End of Session - 03/28/24 1215     Visit Number 7    Number of Visits 18    Date for PT Re-Evaluation 04/22/24    PT Start Time 1100    PT Stop Time 1215    PT Time Calculation (min) 75 min    Activity Tolerance Patient tolerated treatment well    Behavior During Therapy WFL for tasks assessed/performed              Past Medical History:  Diagnosis Date   Arthritis    hands and knees   Complication of anesthesia    aspiration pna; following a colonoscopy, pt requests head of bed elevated if possible.   Constipation    Cough    Depression    Dysrhythmia    RBBB on 06-06-17 ekg    Elevated liver function tests    Esophageal stricture    Family history of breast cancer    Gallstones    Genetic testing 04/02/2018   STAT Breast panel with reflex to Multi-Cancer panel (83 genes) @ [facility] - No pathogenic mutations detected   GERD (gastroesophageal reflux disease)    Hiatal hernia    History of kidney stones    History of radiation therapy 07/09/2018- 08/03/18   Left Breast/ 40.05 Gy in 15 fractions. Left Breast boost 10 Gy in 5 fractions.    Hyperlipemia    Hypothyroidism     Insomnia    Malignant neoplasm of upper-outer quadrant of left female breast (HCC)    Nephrolithiasis    Personal history of radiation therapy    Pneumonia 2015   aspirated after colonosopy   Renal cyst    Past Surgical History:  Procedure Laterality Date   BREAST BIOPSY Left 05/31/2019   mri   BREAST BIOPSY Left 04/2019   malignant   BREAST LUMPECTOMY Left 04/17/2018   malignant   BREAST LUMPECTOMY WITH RADIOACTIVE SEED AND SENTINEL LYMPH NODE BIOPSY Left 04/17/2018   Procedure: LEFT BREAST LUMPECTOMY WITH BRACKETED RADIOACTIVE SEEDS AND SENTINEL LYMPH NODE BIOPSY;  Surgeon: Emelia Loron, MD;  Location: [facility];  Service: General;  Laterality: Left;   BREAST SURGERY Left    Lumpectomy   CARDIOVASCULAR STRESS TEST  07/19/2006   EF 70%, NO EVIDENCE OF ISCHEMIA   CHOLECYSTECTOMY  12/30/10   COLONOSCOPY     DILATION AND CURETTAGE OF UTERUS     HYSTEROSCOPY WITH D & C N/A 06/07/2018   Procedure: DILATATION AND CURETTAGE /HYSTEROSCOPY;  Surgeon: Marcelle Overlie, MD;  Location: [facility];  Service: Gynecology;  Laterality: N/A;   inguinal herniography     NASAL SINUS SURGERY     RADIOACTIVE SEED GUIDED EXCISIONAL BREAST BIOPSY Right 04/17/2018   Procedure: RIGHT BREAST SEED GUIDED EXCISIONAL BIOPSY;  Surgeon: Emelia Loron, MD;  Location: [facility];  Service: General;  Laterality: Right;   RE-EXCISION OF BREAST LUMPECTOMY Left 05/15/2018   Procedure: RE-EXCISION OF LEFT BREAST LUMPECTOMY, ASPIRATION OF LEFT AXILLARY SEROMA;  Surgeon: Emelia Loron, MD;  Location:  [facility];  Service: General;  Laterality: Left;   T &  A     TOOTH EXTRACTION     TOTAL KNEE ARTHROPLASTY Left 09/11/2017   Procedure: LEFT TOTAL KNEE ARTHROPLASTY;  Surgeon: Ollen Gross, MD;  Location: [facility];  Service: Orthopedics;  Laterality: Left;  with block   US ECHOCARDIOGRAPHY  07/17/2006   EF 55-60%   Patient Active Problem List   Diagnosis Date Noted   Other  specified disorders of eustachian tube, right ear 11/15/2023   Chronic rhinitis 11/15/2023   Sensorineural hearing loss, bilateral 11/15/2023   Spinal stenosis of lumbar region with radiculopathy 10/12/2021   Chronic postoperative pain 10/12/2021   Breast mass, right 03/27/2020   Hepatic steatosis 01/22/2020   Genetic testing 04/02/2018   Family history of breast cancer    Malignant neoplasm of upper-outer quadrant of left breast in female, estrogen receptor positive (HCC) 03/16/2018   OA (osteoarthritis) of knee 09/11/2017   Infrapatellar bursitis of right knee 04/29/2014   Pneumonia, aspiration (HCC) 12/28/2012   HYPOTHYROIDISM 11/15/2010   HYPERLIPIDEMIA 11/15/2010   DEPRESSION 11/15/2010   GERD 11/15/2010   CONSTIPATION 11/15/2010   GALLSTONES 11/15/2010   RENAL CYST 11/15/2010   INSOMNIA UNSPECIFIED 11/15/2010   COUGH 11/15/2010   ABDOMINAL PAIN OTHER SPECIFIED SITE 11/15/2010   TRANSAMINASES, SERUM, ELEVATED 11/15/2010    REFERRING PROVIDER: Emelia Loron, MD  REFERRING DIAG: Left arm arm swelling  THERAPY DIAG:  Abnormal posture  Left shoulder pain, unspecified chronicity  Lymphedema, not elsewhere classified  ONSET DATE: 2-3 months ago  Rationale for Evaluation and Treatment: Rehabilitation  SUBJECTIVE:                                                                                                                                                                                           SUBJECTIVE STATEMENT:  I decided that I'll just pay for the first sleeve OOP when we are ready to measure me and then my insurance said I can get reimbursed and then order 2 more. The new stockinette she gave me last time felt wonderful under the bandage!  PERTINENT HISTORY:  04/17/2018 Bilateral lumpectomies with left SLNB with 0+/1 LN. Gr. 2 ILC Er/PR+, HER 2-, Ki 67 5%. Radiation 09/11/2018 -08/03/2018. Most recent prior PT treatment 04/05/2022 to 05/17/22 with this therapist.  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  PAIN:  Are you having pain? No, not today  PRECAUTIONS: Left TKA, sciatica, Lympedema(left), small brain aneurysm  RED FLAGS: None   WEIGHT BEARING RESTRICTIONS: No  FALLS:  Has patient fallen in last 6 months? No  LIVING ENVIRONMENT: Lives with: lives with their spouse Lives in: House/apartment OCCUPATION: retired   LEISURE: reading ,  gardening  HAND DOMINANCE: right   PRIOR LEVEL OF FUNCTION: Independent  PATIENT GOALS: ease the pain, move better, check swelling   OBJECTIVE: Note: Objective measures were completed at Evaluation unless otherwise noted.  COGNITION: Overall cognitive status: Within functional limits for tasks assessed   PALPATION: Tender at proximal biceps, supraspinatus, mid deltoid,tender left pectorals, breast incision on left.  OBSERVATIONS / OTHER ASSESSMENTS: positive left impingement, positive empty  SENSATION: Light touch: Appears intact   POSTURE: forward head, rounded shoulders  UPPER EXTREMITY AROM/PROM:  A/PROM RIGHT   eval   Shoulder extension 65  Shoulder flexion 120  Shoulder abduction 164, slight scaption  Shoulder internal rotation 75  Shoulder external rotation 90    (Blank rows = not tested)  A/PROM LEFT   eval  Shoulder extension 54, tight  Shoulder flexion 120, tight , mild pain at top of shoulder  Shoulder abduction 90  Shoulder internal rotation   Shoulder external rotation     (Blank rows = not tested)  CERVICAL AROM:  within functional limits:     UPPER EXTREMITY STRENGTH: functional but limited by pain on left  LYMPHEDEMA ASSESSMENTS:   SURGERY TYPE/DATE: 04/17/2018 Bilateral lumpectomies with Left SLNB, had a seroma on left  NUMBER OF LYMPH NODES REMOVED: 0+/1  CHEMOTHERAPY: NO  RADIATION:Yes   HORMONE TREATMENT: end 07/2018  INFECTIONS: NO   LYMPHEDEMA ASSESSMENTS:   LANDMARK RIGHT  eval  At axilla  33.0  15 cm proximal to olecranon process 32.3   10 cm proximal to olecranon process 30.8  Olecranon process 25.7  15 cm proximal to ulnar styloid process 23.3  10 cm proximal to ulnar styloid process 20.1  Just proximal to ulnar styloid process 14.2  Across hand at thumb web space 18.0  At base of 2nd digit 6.2  (Blank rows = not tested)  LANDMARK LEFT  eval Left 03/15/24 LEFT 03/20/2024 LEFT 03/22/2024  At axilla  33.7  32.3   15 cm proximal to olecranon process 33.5  33.3   10 cm proximal to olecranon process 32.3  31.9 30.7  Olecranon process 26.8  26.7 26.2  15 cm proximal to ulnar styloid process 23.3  23.5 23.5  10 cm proximal to ulnar styloid process 20.0  20.1 20.1  Just proximal to ulnar styloid process 15.25 15.2 15.2 15.0  Across hand at thumb web space 18.4  17.8   At base of 2nd digit 6.1  6.0 5.9  (Blank rows = not tested)   FUNCTIONAL TESTS:    GAIT: flexed posture,increased kyphosis, independent     QUICK DASH SURVEY: 40.91                                                                                                                            TREATMENT DATE:  03/26/24: Manual Therapy MLD to Lt UE: Short neck, superficial and deep abdominals, Lt inguinal nodes and Lt axillo-inguinal anastomosis, Rt axillary and pectoral nodes, anterior intact thorax  sequence, and then anterior inter-axillary anastomosis, then Lt UE working from proximal to distal and retracing all steps back to anastomosis and ending with LN's.  P/ROM to Lt shoulder into flex, abd and D2 with scapular depression by therapist throughout.  STM to Lt axilla at scar tissue. This was very hypersensitive but this improved well by end of session.  Compression Bandaging to Lt UE as follows: lotion to Lt UE,  Juzo undersleeve, Mollelast to fingers 1-4, Artiflex to UE, and then 1-6 cm short stretch compression bandage to hand/wrist, 1-8 cm spiral from wrist to "X" at elbow, and then 1-10 cm spiral from wrist to 1/2 in below axilla folding juzo under  sleeve over top of bandage. Pt reports this feeling very comfortable.   05-Apr-2024 Codes for garments printed and given to pt at her request.Discussed the issues we are having with DME companies and the time it takes to get garments. Seems willing to pay out of pocket to get sooner. Bandages removed and pts arm measured. Some reduction noted at upper arm.  Pt washed arm and lotion applied. Discussed bandaging with pt and she would like to try a different under sleeve than the TG soft.   MLD to Lt UE: Short neck, superficial and deep abdominals, Lt inguinal nodes and Lt axillo-inguinal anastomosis, Rt axillary nodes,  and then anterior inter-axillary anastomosis, then Lt UE working from proximal to distal and retracing all steps back to anastomosis and ending with LN's. Continued educating pt about basics of anatomy of lymphatic system during MLD and had pt try short neck, and anterior interaxillary pathway. She will need further review. Cut Juzo under sleeve/stockinette to use in place of TG soft as top of bandages always feels too tight for her. Compression Bandaging to Lt UE as follows: lotion to Lt UE,  Mollelast to fingers 1-4, Artiflex to UE, and then 1-6 cm short stretch compression bandage to hand/wrist, 1-8 cm spiral from wrist to "X" at elbow, and then 1-10 cm spiral from wrist to 1/2 in below axilla folding juzo under sleeve over top of bandage.   03/20/2024 STM to Lt axilla, pectoralis insertion and lateral trunk with cocoa butter PROM to left shoulder for flexion, scaption and shoulder abduction with VC's to get pt to relax MLD to Lt UE: Short neck, superficial and deep abdominals, Lt inguinal nodes and Lt axillo-inguinal anastomosis, Rt axillary and pectoral nodes, anterior intact thorax sequence, and then anterior inter-axillary anastomosis, then Lt UE working from proximal to distal and retracing all steps back to anastomosis and continued educating pt about basics of anatomy of lymphatic  system during MLD Compression Bandaging to Lt UE as follows: Cocoa butter to Lt UE, TG soft med, Mollelast to fingers 1-4, Artiflex to UE, and then 1-6 cm short stretch compression bandage to hand/wrist, 1-8 cm spiral from wrist to "X" at elbow, and then 1-10 cm spiral from wrist to  1 inch below axilla folding Tg soft over top of bandage.  03/18/2024 STM to Lt axilla, pectoralis insertion and lateral trunk with cocoa butter PROM to left shoulder for flexion, scaption and shoulder abduction with VC's to get pt to relax MLD to Lt UE: Short neck, superficial and deep abdominals, Lt inguinal nodes and Lt axillo-inguinal anastomosis, Rt axillary and pectoral nodes, anterior intact thorax sequence, and then anterior inter-axillary anastomosis, then Lt UE working from proximal to distal and retracing all steps back to anastomosis and continued educating pt about basics of anatomy of lymphatic system  during MLD Compression Bandaging to Lt UE as follows: Cocoa butter to Lt UE, TG soft med, Mollelast to fingers 1-4, New Artiflex to UE, and then 1-6 cm short stretch compression bandage to hand/wrist, 1-8 cm spiral from wrist to "X" at elbow, and then 1-10 cm spiral from wrist to axilla folding Tg soft over top of bandage. Pt was instructed in remedial exs to perform with bandages on and to remove when they start sliding down. Bandaging instructions for care and removal given      PATIENT EDUCATION:  Education details: see above Person educated: Patient Education method: Explanation Education comprehension: verbalized understanding  HOME EXERCISE PROGRAM: NA  ASSESSMENT:  CLINICAL IMPRESSION: Pt reports would like to wrap at least one more time before being measured for compression garments. Next session will measure pt and see if she fits in an [employer] or [employer] and have her place order through [employer].   OBJECTIVE IMPAIRMENTS: decreased activity tolerance, decreased knowledge of  condition, decreased ROM, decreased strength, increased edema, impaired UE functional use, postural dysfunction, and pain.   ACTIVITY LIMITATIONS: lifting, sleeping, dressing, and reach over head  PARTICIPATION LIMITATIONS: cleaning and laundry  PERSONAL FACTORS:  Left breast Cancer, radiation are also patient's functional outcome.   REHAB POTENTIAL: Good  CLINICAL DECISION MAKING: Evolving/moderate complexity  EVALUATION COMPLEXITY: Moderate  GOALS: Goals reviewed with patient? Yes  SHORT TERM GOALS: Target date: 04/01/2024  Pt will be independent and compliant with HEP for left shoulder ROM/strength  Baseline: Goal status: INITIAL  2.  Pt will be fit for appropriate flat knit compression sleeve to address left UE lymphedema Baseline:  Goal status: INITIAL  3.  Pt will have decreased left shoulder/axillary pain by 25% or more Baseline:  Goal status: INITIAL  4.  Quick dash will improve to greater than 30% to demonstrate improved function Baseline:  Goal status: INITIAL   LONG TERM GOALS: Target date: 04/22/2024  Pt will have decreased complaints left shoulder/axillary pain  by 50%  Baseline:  Goal status: INITIAL  2.  Pt will have left shoulder ROM WNL as compared to right UE   Baseline:  Goal status: INITIAL  3.  Pts quick dash will be no greater than 22% to demonstrate improved function in left shoulder Baseline:  Goal status: INITIAL  4.  Pt will be educated in self MLD to decrease left UE swelling prn Baseline:  Goal status: INITIAL   PLAN:  PT FREQUENCY: 2-3x/week  PT DURATION: 6 weeks  PLANNED INTERVENTIONS: 97164- PT Re-evaluation, 97110-Therapeutic exercises, 97530- Therapeutic activity, 97112- Neuromuscular re-education, 97535- Self Care, 16109- Manual therapy, 97760- Orthotic Fit/training, Patient/Family education, and Joint mobilization  PLAN FOR NEXT SESSION: Measure for flat knit next (fit exostrong or mondi esprit?)  Re measure circumference  next. Cont STM left UT, axillary border of pectorals, initiate postural strength with band if tolerated or gentle isometrics if not, MLD to left UE and instruct pt   Donnetta Hail, PT 03/28/2024, 12:19 PM [facility] [address] [place], [place], [address] Phone: [phone]   Fax:  [phone]  Patient Details  Name: [name] MRN: [id number] Date of Birth: 07/03/1946 Referring Provider:  Emelia Loron, MD  Encounter Date: 03/28/2024  Bayard Hugger. Manson Passey PT  Donnetta Hail, PT 03/28/2024, 12:19 PM  [facility] [address] [place], [place], [address] Phone: [phone]   Fax:  [phone]

## 2024-04-01 ENCOUNTER — Ambulatory Visit

## 2024-04-01 DIAGNOSIS — R600 Localized edema: Secondary | ICD-10-CM

## 2024-04-01 DIAGNOSIS — I890 Lymphedema, not elsewhere classified: Secondary | ICD-10-CM

## 2024-04-01 DIAGNOSIS — R293 Abnormal posture: Secondary | ICD-10-CM

## 2024-04-01 DIAGNOSIS — M25512 Pain in left shoulder: Secondary | ICD-10-CM | POA: Diagnosis not present

## 2024-04-01 DIAGNOSIS — C50412 Malignant neoplasm of upper-outer quadrant of left female breast: Secondary | ICD-10-CM | POA: Diagnosis not present

## 2024-04-01 DIAGNOSIS — M25612 Stiffness of left shoulder, not elsewhere classified: Secondary | ICD-10-CM | POA: Diagnosis not present

## 2024-04-01 NOTE — Therapy (Signed)
 OUTPATIENT PHYSICAL THERAPY  UPPER EXTREMITY ONCOLOGY TREATMENT  Patient Name: [name] MRN: [id number] DOB:1946/03/07, 78 y.o., female Today's Date: 04/01/2024  END OF SESSION:  PT End of Session - 04/01/24 1613     Visit Number 8    Number of Visits 18    Date for PT Re-Evaluation 04/22/24    Authorization Type Medicare    PT Start Time 1607    PT Stop Time 1703    PT Time Calculation (min) 56 min    Activity Tolerance Patient tolerated treatment well    Behavior During Therapy WFL for tasks assessed/performed             Past Medical History:  Diagnosis Date   Arthritis    hands and knees   Complication of anesthesia    aspiration pna; following a colonoscopy, pt requests head of bed elevated if possible.   Constipation    Cough    Depression    Dysrhythmia    RBBB on 06-06-17 ekg    Elevated liver function tests    Esophageal stricture    Family history of breast cancer    Gallstones    Genetic testing 04/02/2018   STAT Breast panel with reflex to Multi-Cancer panel (83 genes) @ [facility] - No pathogenic mutations detected   GERD (gastroesophageal reflux disease)    Hiatal hernia    History of kidney stones    History of radiation therapy 07/09/2018- 08/03/18   Left Breast/ 40.05 Gy in 15 fractions. Left Breast boost 10 Gy in 5 fractions.    Hyperlipemia    Hypothyroidism    Insomnia    Malignant neoplasm of upper-outer quadrant of left female breast (HCC)    Nephrolithiasis    Personal history of radiation therapy    Pneumonia 2015   aspirated after colonosopy   Renal cyst    Past Surgical History:  Procedure Laterality Date   BREAST BIOPSY Left 05/31/2019   mri   BREAST BIOPSY Left 04/2019   malignant   BREAST LUMPECTOMY Left 04/17/2018   malignant   BREAST LUMPECTOMY WITH RADIOACTIVE SEED AND SENTINEL LYMPH NODE BIOPSY Left 04/17/2018   Procedure: LEFT BREAST LUMPECTOMY WITH BRACKETED RADIOACTIVE SEEDS AND SENTINEL LYMPH NODE BIOPSY;   Surgeon: Enid Harry, MD;  Location: [facility];  Service: General;  Laterality: Left;   BREAST SURGERY Left    Lumpectomy   CARDIOVASCULAR STRESS TEST  07/19/2006   EF 70%, NO EVIDENCE OF ISCHEMIA   CHOLECYSTECTOMY  12/30/10   COLONOSCOPY     DILATION AND CURETTAGE OF UTERUS     HYSTEROSCOPY WITH D & C N/A 06/07/2018   Procedure: DILATATION AND CURETTAGE /HYSTEROSCOPY;  Surgeon: Thurman Flores, MD;  Location: [facility];  Service: Gynecology;  Laterality: N/A;   inguinal herniography     NASAL SINUS SURGERY     RADIOACTIVE SEED GUIDED EXCISIONAL BREAST BIOPSY Right 04/17/2018   Procedure: RIGHT BREAST SEED GUIDED EXCISIONAL BIOPSY;  Surgeon: Enid Harry, MD;  Location: [facility];  Service: General;  Laterality: Right;   RE-EXCISION OF BREAST LUMPECTOMY Left 05/15/2018   Procedure: RE-EXCISION OF LEFT BREAST LUMPECTOMY, ASPIRATION OF LEFT AXILLARY SEROMA;  Surgeon: Enid Harry, MD;  Location: [facility];  Service: General;  Laterality: Left;   T & A     TOOTH EXTRACTION     TOTAL KNEE ARTHROPLASTY Left 09/11/2017   Procedure: LEFT TOTAL KNEE ARTHROPLASTY;  Surgeon: Liliane Rei, MD;  Location: [facility];  Service: Orthopedics;  Laterality: Left;  with block   US  ECHOCARDIOGRAPHY  07/17/2006   EF 55-60%   Patient Active Problem List   Diagnosis Date Noted   Other specified disorders of eustachian tube, right ear 11/15/2023   Chronic rhinitis 11/15/2023   Sensorineural hearing loss, bilateral 11/15/2023   Spinal stenosis of lumbar region with radiculopathy 10/12/2021   Chronic postoperative pain 10/12/2021   Breast mass, right 03/27/2020   Hepatic steatosis 01/22/2020   Genetic testing 04/02/2018   Family history of breast cancer    Malignant neoplasm of upper-outer quadrant of left breast in female, estrogen receptor positive (HCC) 03/16/2018   OA (osteoarthritis) of knee 09/11/2017   Infrapatellar bursitis of right knee  04/29/2014   Pneumonia, aspiration (HCC) 12/28/2012   HYPOTHYROIDISM 11/15/2010   HYPERLIPIDEMIA 11/15/2010   DEPRESSION 11/15/2010   GERD 11/15/2010   CONSTIPATION 11/15/2010   GALLSTONES 11/15/2010   RENAL CYST 11/15/2010   INSOMNIA UNSPECIFIED 11/15/2010   COUGH 11/15/2010   ABDOMINAL PAIN OTHER SPECIFIED SITE 11/15/2010   TRANSAMINASES, SERUM, ELEVATED 11/15/2010    REFERRING PROVIDER: Enid Harry, MD  REFERRING DIAG: Left arm arm swelling  THERAPY DIAG:  Abnormal posture  Left shoulder pain, unspecified chronicity  Lymphedema, not elsewhere classified  ONSET DATE: 2-3 months ago  Rationale for Evaluation and Treatment: Rehabilitation  SUBJECTIVE:                                                                                                                                                                                           SUBJECTIVE STATEMENT:  I left the bandages on until last night and my upper arm is so sore today. I haven't heard anything about the garment yet.   PERTINENT HISTORY:  04/17/2018 Bilateral lumpectomies with left SLNB with 0+/1 LN. Gr. 2 ILC Er/PR+, HER 2-, Ki 67 5%. Radiation 09/11/2018 -08/03/2018. Most recent prior PT treatment 04/05/2022 to 05/17/22 with this therapist. 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  PAIN:  Are you having pain? No, just a little sore at upper arm from removing bandages yesterday  PRECAUTIONS: Left TKA, sciatica, Lympedema(left), small brain aneurysm  RED FLAGS: None   WEIGHT BEARING RESTRICTIONS: No  FALLS:  Has patient fallen in last 6 months? No  LIVING ENVIRONMENT: Lives with: lives with their spouse Lives in: House/apartment OCCUPATION: retired   LEISURE: reading , gardening  HAND DOMINANCE: right   PRIOR LEVEL OF FUNCTION: Independent  PATIENT GOALS: ease the pain, move better, check swelling   OBJECTIVE: Note: Objective measures were completed at Evaluation unless otherwise  noted.  COGNITION: Overall cognitive status: Within functional limits for  tasks assessed   PALPATION: Tender at proximal biceps, supraspinatus, mid deltoid,tender left pectorals, breast incision on left.  OBSERVATIONS / OTHER ASSESSMENTS: positive left impingement, positive empty  SENSATION: Light touch: Appears intact   POSTURE: forward head, rounded shoulders  UPPER EXTREMITY AROM/PROM:  A/PROM RIGHT   eval   Shoulder extension 65  Shoulder flexion 120  Shoulder abduction 164, slight scaption  Shoulder internal rotation 75  Shoulder external rotation 90    (Blank rows = not tested)  A/PROM LEFT   eval  Shoulder extension 54, tight  Shoulder flexion 120, tight , mild pain at top of shoulder  Shoulder abduction 90  Shoulder internal rotation   Shoulder external rotation     (Blank rows = not tested)  CERVICAL AROM:  within functional limits:     UPPER EXTREMITY STRENGTH: functional but limited by pain on left  LYMPHEDEMA ASSESSMENTS:   SURGERY TYPE/DATE: 04/17/2018 Bilateral lumpectomies with Left SLNB, had a seroma on left  NUMBER OF LYMPH NODES REMOVED: 0+/1  CHEMOTHERAPY: NO  RADIATION:Yes   HORMONE TREATMENT: end 07/2018  INFECTIONS: NO   LYMPHEDEMA ASSESSMENTS:   LANDMARK RIGHT  eval  At axilla  33.0  15 cm proximal to olecranon process 32.3  10 cm proximal to olecranon process 30.8  Olecranon process 25.7  15 cm proximal to ulnar styloid process 23.3  10 cm proximal to ulnar styloid process 20.1  Just proximal to ulnar styloid process 14.2  Across hand at thumb web space 18.0  At base of 2nd digit 6.2  (Blank rows = not tested)  LANDMARK LEFT  eval Left 03/15/24 LEFT 03/20/2024 LEFT 03/22/2024  At axilla  33.7  32.3   15 cm proximal to olecranon process 33.5  33.3   10 cm proximal to olecranon process 32.3  31.9 30.7  Olecranon process 26.8  26.7 26.2  15 cm proximal to ulnar styloid process 23.3  23.5 23.5  10 cm proximal to ulnar  styloid process 20.0  20.1 20.1  Just proximal to ulnar styloid process 15.25 15.2 15.2 15.0  Across hand at thumb web space 18.4  17.8   At base of 2nd digit 6.1  6.0 5.9  (Blank rows = not tested)   FUNCTIONAL TESTS:    GAIT: flexed posture,increased kyphosis, independent     QUICK DASH SURVEY: 40.91                                                                                                                            TREATMENT DATE:  04/01/24: Manual therapy:  MLD to Lt UE: Short neck, superficial and deep abdominals, Lt inguinal nodes and Lt axillo-inguinal anastomosis, Rt axillary and pectoral nodes, anterior intact thorax sequence, and then anterior inter-axillary anastomosis, then Lt UE working from proximal to distal and retracing all steps back to anastomosis and ending with LN's.  Compression Bandaging to Lt UE as follows: Cocoa butter to Lt UE,  Juzo undersleeve (issued new  one so pt could clean current one) , Mollelast to fingers 1-4, Artiflex to UE, and then 1-6 cm short stretch compression bandage to hand/wrist, 1-8 cm spiral from wrist to "X" at elbow, and then 1-10 cm spiral from wrist to 1/2 in below axilla folding juzo under sleeve over top of bandage.   03/28/2024 Orhotic fit :Measured for exostrong sleeve and gauntlet and assisted with order through [employer] 18. Wrist 16, elbow 27.5, axilla 34  size medium ave sleeve and small gauntlet  Manual therapy : MLD to Lt UE: Short neck, superficial and deep abdominals, Lt inguinal nodes and Lt axillo-inguinal anastomosis, Rt axillary and pectoral nodes, anterior intact thorax sequence, and then anterior inter-axillary anastomosis, then Lt UE working from proximal to distal and retracing all steps back to anastomosis and ending with LN's.  P/ROM to Lt shoulder into flex, abd and D2 with scapular depression by therapist throughout.  STM to Lt axilla at scar tissue. This was very hypersensitive but this improved  well by end of session.  Compression Bandaging to Lt UE as follows: lotion to Lt UE,  Juzo undersleeve, Mollelast to fingers 1-4, Artiflex to UE, and then 1-6 cm short stretch compression bandage to hand/wrist, 1-8 cm spiral from wrist to "X" at elbow, and then 1-10 cm spiral from wrist to 1/2 in below axilla folding juzo under sleeve over top of bandage. Pt reports this feeling very comfortable.   03/26/24: Manual Therapy MLD to Lt UE: Short neck, superficial and deep abdominals, Lt inguinal nodes and Lt axillo-inguinal anastomosis, Rt axillary and pectoral nodes, anterior intact thorax sequence, and then anterior inter-axillary anastomosis, then Lt UE working from proximal to distal and retracing all steps back to anastomosis and ending with LN's.  P/ROM to Lt shoulder into flex, abd and D2 with scapular depression by therapist throughout.  STM to Lt axilla at scar tissue. This was very hypersensitive but this improved well by end of session.  Compression Bandaging to Lt UE as follows: lotion to Lt UE,  Juzo undersleeve, Mollelast to fingers 1-4, Artiflex to UE, and then 1-6 cm short stretch compression bandage to hand/wrist, 1-8 cm spiral from wrist to "X" at elbow, and then 1-10 cm spiral from wrist to 1/2 in below axilla folding juzo under sleeve over top of bandage. Pt reports this feeling very comfortable.   04-05-24 Codes for garments printed and given to pt at her request.Discussed the issues we are having with DME companies and the time it takes to get garments. Seems willing to pay out of pocket to get sooner. Bandages removed and pts arm measured. Some reduction noted at upper arm.  Pt washed arm and lotion applied. Discussed bandaging with pt and she would like to try a different under sleeve than the TG soft.   MLD to Lt UE: Short neck, superficial and deep abdominals, Lt inguinal nodes and Lt axillo-inguinal anastomosis, Rt axillary nodes,  and then anterior inter-axillary anastomosis,  then Lt UE working from proximal to distal and retracing all steps back to anastomosis and ending with LN's. Continued educating pt about basics of anatomy of lymphatic system during MLD and had pt try short neck, and anterior interaxillary pathway. She will need further review. Cut Juzo under sleeve/stockinette to use in place of TG soft as top of bandages always feels too tight for her. Compression Bandaging to Lt UE as follows: lotion to Lt UE,  Mollelast to fingers 1-4, Artiflex to UE, and then 1-6  cm short stretch compression bandage to hand/wrist, 1-8 cm spiral from wrist to "X" at elbow, and then 1-10 cm spiral from wrist to 1/2 in below axilla folding juzo under sleeve over top of bandage.   03/20/2024 STM to Lt axilla, pectoralis insertion and lateral trunk with cocoa butter PROM to left shoulder for flexion, scaption and shoulder abduction with VC's to get pt to relax MLD to Lt UE: Short neck, superficial and deep abdominals, Lt inguinal nodes and Lt axillo-inguinal anastomosis, Rt axillary and pectoral nodes, anterior intact thorax sequence, and then anterior inter-axillary anastomosis, then Lt UE working from proximal to distal and retracing all steps back to anastomosis and continued educating pt about basics of anatomy of lymphatic system during MLD Compression Bandaging to Lt UE as follows: Cocoa butter to Lt UE, TG soft med, Mollelast to fingers 1-4, Artiflex to UE, and then 1-6 cm short stretch compression bandage to hand/wrist, 1-8 cm spiral from wrist to "X" at elbow, and then 1-10 cm spiral from wrist to  1 inch below axilla folding Tg soft over top of bandage.  03/18/2024 STM to Lt axilla, pectoralis insertion and lateral trunk with cocoa butter PROM to left shoulder for flexion, scaption and shoulder abduction with VC's to get pt to relax MLD to Lt UE: Short neck, superficial and deep abdominals, Lt inguinal nodes and Lt axillo-inguinal anastomosis, Rt axillary and pectoral nodes,  anterior intact thorax sequence, and then anterior inter-axillary anastomosis, then Lt UE working from proximal to distal and retracing all steps back to anastomosis and continued educating pt about basics of anatomy of lymphatic system during MLD Compression Bandaging to Lt UE as follows: Cocoa butter to Lt UE, TG soft med, Mollelast to fingers 1-4, New Artiflex to UE, and then 1-6 cm short stretch compression bandage to hand/wrist, 1-8 cm spiral from wrist to "X" at elbow, and then 1-10 cm spiral from wrist to axilla folding Tg soft over top of bandage. Pt was instructed in remedial exs to perform with bandages on and to remove when they start sliding down. Bandaging instructions for care and removal given      PATIENT EDUCATION:  Education details: see above Person educated: Patient Education method: Explanation Education comprehension: verbalized understanding  HOME EXERCISE PROGRAM: NA  ASSESSMENT:  CLINICAL IMPRESSION: Pt reports left her Thursday bandages on until last night and her upper arm was really sore. Reminded pt to remove bandages next time sooner if she starts having soreness as this can further irritate her skin, especially when it's longer between appts like this weekend was. Pt verbalized good understanding. Continued with CDT of Lt UE. Garments were just ordered end of last week so have yet to arrive.    OBJECTIVE IMPAIRMENTS: decreased activity tolerance, decreased knowledge of condition, decreased ROM, decreased strength, increased edema, impaired UE functional use, postural dysfunction, and pain.   ACTIVITY LIMITATIONS: lifting, sleeping, dressing, and reach over head  PARTICIPATION LIMITATIONS: cleaning and laundry  PERSONAL FACTORS:  Left breast Cancer, radiation are also patient's functional outcome.   REHAB POTENTIAL: Good  CLINICAL DECISION MAKING: Evolving/moderate complexity  EVALUATION COMPLEXITY: Moderate  GOALS: Goals reviewed with patient?  Yes  SHORT TERM GOALS: Target date: 04/01/2024  Pt will be independent and compliant with HEP for left shoulder ROM/strength  Baseline: Goal status: INITIAL  2.  Pt will be fit for appropriate flat knit compression sleeve to address left UE lymphedema Baseline:  Goal status: INITIAL  3.  Pt will have decreased left  shoulder/axillary pain by 25% or more Baseline:  Goal status: INITIAL  4.  Quick dash will improve to greater than 30% to demonstrate improved function Baseline:  Goal status: INITIAL   LONG TERM GOALS: Target date: 04/22/2024  Pt will have decreased complaints left shoulder/axillary pain  by 50%  Baseline:  Goal status: INITIAL  2.  Pt will have left shoulder ROM WNL as compared to right UE   Baseline:  Goal status: INITIAL  3.  Pts quick dash will be no greater than 22% to demonstrate improved function in left shoulder Baseline:  Goal status: INITIAL  4.  Pt will be educated in self MLD to decrease left UE swelling prn Baseline:  Goal status: INITIAL   PLAN:  PT FREQUENCY: 2-3x/week  PT DURATION: 6 weeks  PLANNED INTERVENTIONS: 97164- PT Re-evaluation, 97110-Therapeutic exercises, 97530- Therapeutic activity, 97112- Neuromuscular re-education, 97535- Self Care, 29528- Manual therapy, [phone]- Orthotic Fit/training, Patient/Family education, and Joint mobilization  PLAN FOR NEXT SESSION: check sleeve/gauntlet when it arrives.Re measure circumference next. Cont STM left UT, axillary border of pectorals, initiate postural strength with band if tolerated or gentle isometrics if not, MLD to left UE and instruct pt  Denyce Flank, PTA 04/01/2024, 5:08 PM

## 2024-04-03 ENCOUNTER — Ambulatory Visit

## 2024-04-03 DIAGNOSIS — M25512 Pain in left shoulder: Secondary | ICD-10-CM | POA: Diagnosis not present

## 2024-04-03 DIAGNOSIS — I890 Lymphedema, not elsewhere classified: Secondary | ICD-10-CM

## 2024-04-03 DIAGNOSIS — M25612 Stiffness of left shoulder, not elsewhere classified: Secondary | ICD-10-CM | POA: Diagnosis not present

## 2024-04-03 DIAGNOSIS — C50412 Malignant neoplasm of upper-outer quadrant of left female breast: Secondary | ICD-10-CM | POA: Diagnosis not present

## 2024-04-03 DIAGNOSIS — Z170 Estrogen receptor positive status [ER+]: Secondary | ICD-10-CM

## 2024-04-03 DIAGNOSIS — R600 Localized edema: Secondary | ICD-10-CM

## 2024-04-03 DIAGNOSIS — R293 Abnormal posture: Secondary | ICD-10-CM

## 2024-04-03 NOTE — Therapy (Signed)
 OUTPATIENT PHYSICAL THERAPY  UPPER EXTREMITY ONCOLOGY TREATMENT  Patient Name: [name] MRN: [id number] DOB:16-Sep-1946, 78 y.o., female Today's Date: 04/03/2024  END OF SESSION:  PT End of Session - 04/03/24 1614     Visit Number 9    Number of Visits 18    Date for PT Re-Evaluation 04/22/24    Authorization Type Medicare    PT Start Time 1605    PT Stop Time 1658    PT Time Calculation (min) 53 min    Activity Tolerance Patient tolerated treatment well    Behavior During Therapy WFL for tasks assessed/performed             Past Medical History:  Diagnosis Date   Arthritis    hands and knees   Complication of anesthesia    aspiration pna; following a colonoscopy, pt requests head of bed elevated if possible.   Constipation    Cough    Depression    Dysrhythmia    RBBB on 06-06-17 ekg    Elevated liver function tests    Esophageal stricture    Family history of breast cancer    Gallstones    Genetic testing 04/02/2018   STAT Breast panel with reflex to Multi-Cancer panel (83 genes) @ [facility] - No pathogenic mutations detected   GERD (gastroesophageal reflux disease)    Hiatal hernia    History of kidney stones    History of radiation therapy 07/09/2018- 08/03/18   Left Breast/ 40.05 Gy in 15 fractions. Left Breast boost 10 Gy in 5 fractions.    Hyperlipemia    Hypothyroidism    Insomnia    Malignant neoplasm of upper-outer quadrant of left female breast (HCC)    Nephrolithiasis    Personal history of radiation therapy    Pneumonia 2015   aspirated after colonosopy   Renal cyst    Past Surgical History:  Procedure Laterality Date   BREAST BIOPSY Left 05/31/2019   mri   BREAST BIOPSY Left 04/2019   malignant   BREAST LUMPECTOMY Left 04/17/2018   malignant   BREAST LUMPECTOMY WITH RADIOACTIVE SEED AND SENTINEL LYMPH NODE BIOPSY Left 04/17/2018   Procedure: LEFT BREAST LUMPECTOMY WITH BRACKETED RADIOACTIVE SEEDS AND SENTINEL LYMPH NODE BIOPSY;   Surgeon: Enid Harry, MD;  Location: [facility];  Service: General;  Laterality: Left;   BREAST SURGERY Left    Lumpectomy   CARDIOVASCULAR STRESS TEST  07/19/2006   EF 70%, NO EVIDENCE OF ISCHEMIA   CHOLECYSTECTOMY  12/30/10   COLONOSCOPY     DILATION AND CURETTAGE OF UTERUS     HYSTEROSCOPY WITH D & C N/A 06/07/2018   Procedure: DILATATION AND CURETTAGE /HYSTEROSCOPY;  Surgeon: Thurman Flores, MD;  Location: [facility];  Service: Gynecology;  Laterality: N/A;   inguinal herniography     NASAL SINUS SURGERY     RADIOACTIVE SEED GUIDED EXCISIONAL BREAST BIOPSY Right 04/17/2018   Procedure: RIGHT BREAST SEED GUIDED EXCISIONAL BIOPSY;  Surgeon: Enid Harry, MD;  Location: [facility];  Service: General;  Laterality: Right;   RE-EXCISION OF BREAST LUMPECTOMY Left 05/15/2018   Procedure: RE-EXCISION OF LEFT BREAST LUMPECTOMY, ASPIRATION OF LEFT AXILLARY SEROMA;  Surgeon: Enid Harry, MD;  Location: [facility];  Service: General;  Laterality: Left;   T & A     TOOTH EXTRACTION     TOTAL KNEE ARTHROPLASTY Left 09/11/2017   Procedure: LEFT TOTAL KNEE ARTHROPLASTY;  Surgeon: Liliane Rei, MD;  Location: [facility];  Service: Orthopedics;  Laterality: Left;  with block   US  ECHOCARDIOGRAPHY  07/17/2006   EF 55-60%   Patient Active Problem List   Diagnosis Date Noted   Other specified disorders of eustachian tube, right ear 11/15/2023   Chronic rhinitis 11/15/2023   Sensorineural hearing loss, bilateral 11/15/2023   Spinal stenosis of lumbar region with radiculopathy 10/12/2021   Chronic postoperative pain 10/12/2021   Breast mass, right 03/27/2020   Hepatic steatosis 01/22/2020   Genetic testing 04/02/2018   Family history of breast cancer    Malignant neoplasm of upper-outer quadrant of left breast in female, estrogen receptor positive (HCC) 03/16/2018   OA (osteoarthritis) of knee 09/11/2017   Infrapatellar bursitis of right knee  04/29/2014   Pneumonia, aspiration (HCC) 12/28/2012   HYPOTHYROIDISM 11/15/2010   HYPERLIPIDEMIA 11/15/2010   DEPRESSION 11/15/2010   GERD 11/15/2010   CONSTIPATION 11/15/2010   GALLSTONES 11/15/2010   RENAL CYST 11/15/2010   INSOMNIA UNSPECIFIED 11/15/2010   COUGH 11/15/2010   ABDOMINAL PAIN OTHER SPECIFIED SITE 11/15/2010   TRANSAMINASES, SERUM, ELEVATED 11/15/2010    REFERRING PROVIDER: Enid Harry, MD  REFERRING DIAG: Left arm arm swelling  THERAPY DIAG:  Abnormal posture  Left shoulder pain, unspecified chronicity  Lymphedema, not elsewhere classified  ONSET DATE: 2-3 months ago  Rationale for Evaluation and Treatment: Rehabilitation  SUBJECTIVE:                                                                                                                                                                                           SUBJECTIVE STATEMENT:  I left the bandages on. I just have some discomfort at the inside of my upper arm and one spot on my forearm where theres a ridge.   PERTINENT HISTORY:  04/17/2018 Bilateral lumpectomies with left SLNB with 0+/1 LN. Gr. 2 ILC Er/PR+, HER 2-, Ki 67 5%. Radiation 09/11/2018 -08/03/2018. Most recent prior PT treatment 04/05/2022 to 05/17/22 with this therapist. 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  PAIN:  Are you having pain? No, just a little sore at upper arm from bandages   PRECAUTIONS: Left TKA, sciatica, Lympedema(left), small brain aneurysm  RED FLAGS: None   WEIGHT BEARING RESTRICTIONS: No  FALLS:  Has patient fallen in last 6 months? No  LIVING ENVIRONMENT: Lives with: lives with their spouse Lives in: House/apartment OCCUPATION: retired   LEISURE: reading , gardening  HAND DOMINANCE: right   PRIOR LEVEL OF FUNCTION: Independent  PATIENT GOALS: ease the pain, move better, check swelling   OBJECTIVE: Note: Objective measures were completed at Evaluation unless otherwise  noted.  COGNITION: Overall cognitive status: Within functional  limits for tasks assessed   PALPATION: Tender at proximal biceps, supraspinatus, mid deltoid,tender left pectorals, breast incision on left.  OBSERVATIONS / OTHER ASSESSMENTS: positive left impingement, positive empty  SENSATION: Light touch: Appears intact   POSTURE: forward head, rounded shoulders  UPPER EXTREMITY AROM/PROM:  A/PROM RIGHT   eval   Shoulder extension 65  Shoulder flexion 120  Shoulder abduction 164, slight scaption  Shoulder internal rotation 75  Shoulder external rotation 90    (Blank rows = not tested)  A/PROM LEFT   eval  Shoulder extension 54, tight  Shoulder flexion 120, tight , mild pain at top of shoulder  Shoulder abduction 90  Shoulder internal rotation   Shoulder external rotation     (Blank rows = not tested)  CERVICAL AROM:  within functional limits:     UPPER EXTREMITY STRENGTH: functional but limited by pain on left  LYMPHEDEMA ASSESSMENTS:   SURGERY TYPE/DATE: 04/17/2018 Bilateral lumpectomies with Left SLNB, had a seroma on left  NUMBER OF LYMPH NODES REMOVED: 0+/1  CHEMOTHERAPY: NO  RADIATION:Yes   HORMONE TREATMENT: end 07/2018  INFECTIONS: NO   LYMPHEDEMA ASSESSMENTS:   LANDMARK RIGHT  eval  At axilla  33.0  15 cm proximal to olecranon process 32.3  10 cm proximal to olecranon process 30.8  Olecranon process 25.7  15 cm proximal to ulnar styloid process 23.3  10 cm proximal to ulnar styloid process 20.1  Just proximal to ulnar styloid process 14.2  Across hand at thumb web space 18.0  At base of 2nd digit 6.2  (Blank rows = not tested)  LANDMARK LEFT  eval Left 03/15/24 LEFT 03/20/2024 LEFT 03/22/2024 Left 04/03/24  At axilla  33.7  32.3  31.7  15 cm proximal to olecranon process 33.5  33.3  30.7  10 cm proximal to olecranon process 32.3  31.9 30.7 29.6  Olecranon process 26.8  26.7 26.2 25.4  15 cm proximal to ulnar styloid process 23.3  23.5  23.5 23.5  10 cm proximal to ulnar styloid process 20.0  20.1 20.1 20.4  Just proximal to ulnar styloid process 15.25 15.2 15.2 15.0 14.8  Across hand at thumb web space 18.4  17.8  16.7  At base of 2nd digit 6.1  6.0 5.9 5.8  (Blank rows = not tested)   FUNCTIONAL TESTS:    GAIT: flexed posture,increased kyphosis, independent     QUICK DASH SURVEY: 40.91                                                                                                                            TREATMENT DATE:  04/03/24: Manual therapy:  MLD to Lt UE: Short neck, superficial and deep abdominals, Lt inguinal nodes and Lt axillo-inguinal anastomosis, Rt axillary and pectoral nodes, anterior intact thorax sequence, and then anterior inter-axillary anastomosis, then Lt UE working from proximal to distal and retracing all steps back to anastomosis and ending with LN's.  P/ROM to Lt shoulder  into flex, abd and D2 with scapular depression by therapist throughout.  STM to Lt axilla at scar tissue, pt with much less sensitivity here now Circumference measurements re taken.  Compression Bandaging to Lt UE as follows: Cocoa butter to Lt UE,  Juzo undersleeve, Mollelast to fingers 1-4, Artiflex to UE, and then 1-6 cm short stretch compression bandage to hand/wrist with less repetitions at hand, 1-8 cm spiral from wrist to "X" at elbow, and then 1-10 cm spiral from wrist to 1/2 in below axilla folding juzo under sleeve over top of bandage.   04/01/24: Manual therapy:  MLD to Lt UE: Short neck, superficial and deep abdominals, Lt inguinal nodes and Lt axillo-inguinal anastomosis, Rt axillary and pectoral nodes, anterior intact thorax sequence, and then anterior inter-axillary anastomosis, then Lt UE working from proximal to distal and retracing all steps back to anastomosis and ending with LN's.  Compression Bandaging to Lt UE as follows: Cocoa butter to Lt UE,  Juzo undersleeve (issued new one so pt could clean current  one) , Mollelast to fingers 1-4, Artiflex to UE, and then 1-6 cm short stretch compression bandage to hand/wrist, 1-8 cm spiral from wrist to "X" at elbow, and then 1-10 cm spiral from wrist to 1/2 in below axilla folding juzo under sleeve over top of bandage.   03/28/2024 Orhotic fit :Measured for exostrong sleeve and gauntlet and assisted with order through Compression [employer] 18. Wrist 16, elbow 27.5, axilla 34  size medium ave sleeve and small gauntlet  Manual therapy : MLD to Lt UE: Short neck, superficial and deep abdominals, Lt inguinal nodes and Lt axillo-inguinal anastomosis, Rt axillary and pectoral nodes, anterior intact thorax sequence, and then anterior inter-axillary anastomosis, then Lt UE working from proximal to distal and retracing all steps back to anastomosis and ending with LN's.  P/ROM to Lt shoulder into flex, abd and D2 with scapular depression by therapist throughout.  STM to Lt axilla at scar tissue. This was very hypersensitive but this improved well by end of session.  Compression Bandaging to Lt UE as follows: lotion to Lt UE,  Juzo undersleeve, Mollelast to fingers 1-4, Artiflex to UE, and then 1-6 cm short stretch compression bandage to hand/wrist, 1-8 cm spiral from wrist to "X" at elbow, and then 1-10 cm spiral from wrist to 1/2 in below axilla folding juzo under sleeve over top of bandage. Pt reports this feeling very comfortable.   03/26/24: Manual Therapy MLD to Lt UE: Short neck, superficial and deep abdominals, Lt inguinal nodes and Lt axillo-inguinal anastomosis, Rt axillary and pectoral nodes, anterior intact thorax sequence, and then anterior inter-axillary anastomosis, then Lt UE working from proximal to distal and retracing all steps back to anastomosis and ending with LN's.  P/ROM to Lt shoulder into flex, abd and D2 with scapular depression by therapist throughout.  STM to Lt axilla at scar tissue. This was very hypersensitive but this improved well by end  of session.  Compression Bandaging to Lt UE as follows: lotion to Lt UE,  Juzo undersleeve, Mollelast to fingers 1-4, Artiflex to UE, and then 1-6 cm short stretch compression bandage to hand/wrist, 1-8 cm spiral from wrist to "X" at elbow, and then 1-10 cm spiral from wrist to 1/2 in below axilla folding juzo under sleeve over top of bandage. Pt reports this feeling very comfortable.   25-Mar-2024 Codes for garments printed and given to pt at her request.Discussed the issues we are having with DME companies and  the time it takes to get garments. Seems willing to pay out of pocket to get sooner. Bandages removed and pts arm measured. Some reduction noted at upper arm.  Pt washed arm and lotion applied. Discussed bandaging with pt and she would like to try a different under sleeve than the TG soft.   MLD to Lt UE: Short neck, superficial and deep abdominals, Lt inguinal nodes and Lt axillo-inguinal anastomosis, Rt axillary nodes,  and then anterior inter-axillary anastomosis, then Lt UE working from proximal to distal and retracing all steps back to anastomosis and ending with LN's. Continued educating pt about basics of anatomy of lymphatic system during MLD and had pt try short neck, and anterior interaxillary pathway. She will need further review. Cut Juzo under sleeve/stockinette to use in place of TG soft as top of bandages always feels too tight for her. Compression Bandaging to Lt UE as follows: lotion to Lt UE,  Mollelast to fingers 1-4, Artiflex to UE, and then 1-6 cm short stretch compression bandage to hand/wrist, 1-8 cm spiral from wrist to "X" at elbow, and then 1-10 cm spiral from wrist to 1/2 in below axilla folding juzo under sleeve over top of bandage.   03/20/2024 STM to Lt axilla, pectoralis insertion and lateral trunk with cocoa butter PROM to left shoulder for flexion, scaption and shoulder abduction with VC's to get pt to relax MLD to Lt UE: Short neck, superficial and deep abdominals,  Lt inguinal nodes and Lt axillo-inguinal anastomosis, Rt axillary and pectoral nodes, anterior intact thorax sequence, and then anterior inter-axillary anastomosis, then Lt UE working from proximal to distal and retracing all steps back to anastomosis and continued educating pt about basics of anatomy of lymphatic system during MLD Compression Bandaging to Lt UE as follows: Cocoa butter to Lt UE, TG soft med, Mollelast to fingers 1-4, Artiflex to UE, and then 1-6 cm short stretch compression bandage to hand/wrist, 1-8 cm spiral from wrist to "X" at elbow, and then 1-10 cm spiral from wrist to  1 inch below axilla folding Tg soft over top of bandage.  03/18/2024 STM to Lt axilla, pectoralis insertion and lateral trunk with cocoa butter PROM to left shoulder for flexion, scaption and shoulder abduction with VC's to get pt to relax MLD to Lt UE: Short neck, superficial and deep abdominals, Lt inguinal nodes and Lt axillo-inguinal anastomosis, Rt axillary and pectoral nodes, anterior intact thorax sequence, and then anterior inter-axillary anastomosis, then Lt UE working from proximal to distal and retracing all steps back to anastomosis and continued educating pt about basics of anatomy of lymphatic system during MLD Compression Bandaging to Lt UE as follows: Cocoa butter to Lt UE, TG soft med, Mollelast to fingers 1-4, New Artiflex to UE, and then 1-6 cm short stretch compression bandage to hand/wrist, 1-8 cm spiral from wrist to "X" at elbow, and then 1-10 cm spiral from wrist to axilla folding Tg soft over top of bandage. Pt was instructed in remedial exs to perform with bandages on and to remove when they start sliding down. Bandaging instructions for care and removal given      PATIENT EDUCATION:  Education details: see above Person educated: Patient Education method: Explanation Education comprehension: verbalized understanding  HOME EXERCISE PROGRAM: NA  ASSESSMENT:  CLINICAL  IMPRESSION: Pt arrives with bandages intact from last session. Some redness at medial upper arm and one spot on forearm where bandage had slid down a bit. Pt reports this felt much better once  cocoa butter applied after MLD. Today continued with CDT of Lt UE. Her new day time garments have not arrived yet. Pt is interested in pursuing a nighttime garment and Flexitouch compression pump, so with her permission, will fax her demographics and PT note to [facility] custom fit and [employer] for insurance verification.  Despite treatment that consists of compression bandaging, remedial exercises, and manual lymph drainage pts chronic lymphedema persists as is evidenced by fibrosis at forearm. She will benefit from a Flexitouch compression pump to help her better manage her lymphedema symptoms. She will also benefit from a Tribute nighttime garment, size small with an oversleeve, also size small, for management of her symptoms at night. She will need 1 of these every 6 months x 30 years.    OBJECTIVE IMPAIRMENTS: decreased activity tolerance, decreased knowledge of condition, decreased ROM, decreased strength, increased edema, impaired UE functional use, postural dysfunction, and pain.   ACTIVITY LIMITATIONS: lifting, sleeping, dressing, and reach over head  PARTICIPATION LIMITATIONS: cleaning and laundry  PERSONAL FACTORS:  Left breast Cancer, radiation are also patient's functional outcome.   REHAB POTENTIAL: Good  CLINICAL DECISION MAKING: Evolving/moderate complexity  EVALUATION COMPLEXITY: Moderate  GOALS: Goals reviewed with patient? Yes  SHORT TERM GOALS: Target date: 04/01/2024  Pt will be independent and compliant with HEP for left shoulder ROM/strength  Baseline: Goal status: INITIAL  2.  Pt will be fit for appropriate flat knit compression sleeve to address left UE lymphedema Baseline:  Goal status: INITIAL  3.  Pt will have decreased left shoulder/axillary pain by 25% or  more Baseline:  Goal status: INITIAL  4.  Quick dash will improve to greater than 30% to demonstrate improved function Baseline:  Goal status: INITIAL   LONG TERM GOALS: Target date: 04/22/2024  Pt will have decreased complaints left shoulder/axillary pain  by 50%  Baseline:  Goal status: INITIAL  2.  Pt will have left shoulder ROM WNL as compared to right UE   Baseline:  Goal status: INITIAL  3.  Pts quick dash will be no greater than 22% to demonstrate improved function in left shoulder Baseline:  Goal status: INITIAL  4.  Pt will be educated in self MLD to decrease left UE swelling prn Baseline:  Goal status: INITIAL   PLAN:  PT FREQUENCY: 2-3x/week  PT DURATION: 6 weeks  PLANNED INTERVENTIONS: 97164- PT Re-evaluation, 97110-Therapeutic exercises, 97530- Therapeutic activity, 97112- Neuromuscular re-education, 97535- Self Care, [id number]- Manual therapy, [phone]- Orthotic Fit/training, Patient/Family education, and Joint mobilization  PLAN FOR NEXT SESSION: check sleeve/gauntlet when it arrives. Cont STM left UT, axillary border of pectorals, initiate postural strength with band if tolerated or gentle isometrics if not, MLD to left UE and instruct pt  04/03/24 - Faxed demographics and PT note to [employer] and [facility] custom fit. Also faxed script to Dr .Delane Fear for the nighttime garment. VR  Denyce Flank, PTA 04/03/2024, 5:21 PM

## 2024-04-05 ENCOUNTER — Ambulatory Visit

## 2024-04-05 DIAGNOSIS — R293 Abnormal posture: Secondary | ICD-10-CM | POA: Diagnosis not present

## 2024-04-05 DIAGNOSIS — M25512 Pain in left shoulder: Secondary | ICD-10-CM | POA: Diagnosis not present

## 2024-04-05 DIAGNOSIS — I890 Lymphedema, not elsewhere classified: Secondary | ICD-10-CM | POA: Diagnosis not present

## 2024-04-05 DIAGNOSIS — M25612 Stiffness of left shoulder, not elsewhere classified: Secondary | ICD-10-CM | POA: Diagnosis not present

## 2024-04-05 DIAGNOSIS — C50412 Malignant neoplasm of upper-outer quadrant of left female breast: Secondary | ICD-10-CM | POA: Diagnosis not present

## 2024-04-05 DIAGNOSIS — Z170 Estrogen receptor positive status [ER+]: Secondary | ICD-10-CM

## 2024-04-05 DIAGNOSIS — R600 Localized edema: Secondary | ICD-10-CM | POA: Diagnosis not present

## 2024-04-05 NOTE — Therapy (Signed)
 OUTPATIENT PHYSICAL THERAPY  UPPER EXTREMITY ONCOLOGY TREATMENT  Patient Name: [name] MRN: [id number] DOB:Aug 25, 1946, 78 y.o., female Today's Date: 04/05/2024  END OF SESSION:  PT End of Session - 04/05/24 1116     Visit Number 10    Number of Visits 18    Date for PT Re-Evaluation 04/22/24    Authorization Type Medicare    PT Start Time 1106    PT Stop Time 1207    PT Time Calculation (min) 61 min    Activity Tolerance Patient tolerated treatment well    Behavior During Therapy WFL for tasks assessed/performed             Past Medical History:  Diagnosis Date   Arthritis    hands and knees   Complication of anesthesia    aspiration pna; following a colonoscopy, pt requests head of bed elevated if possible.   Constipation    Cough    Depression    Dysrhythmia    RBBB on 06-06-17 ekg    Elevated liver function tests    Esophageal stricture    Family history of breast cancer    Gallstones    Genetic testing 04/02/2018   STAT Breast panel with reflex to Multi-Cancer panel (83 genes) @ [facility] - No pathogenic mutations detected   GERD (gastroesophageal reflux disease)    Hiatal hernia    History of kidney stones    History of radiation therapy 07/09/2018- 08/03/18   Left Breast/ 40.05 Gy in 15 fractions. Left Breast boost 10 Gy in 5 fractions.    Hyperlipemia    Hypothyroidism    Insomnia    Malignant neoplasm of upper-outer quadrant of left female breast (HCC)    Nephrolithiasis    Personal history of radiation therapy    Pneumonia 2015   aspirated after colonosopy   Renal cyst    Past Surgical History:  Procedure Laterality Date   BREAST BIOPSY Left 05/31/2019   mri   BREAST BIOPSY Left 04/2019   malignant   BREAST LUMPECTOMY Left 04/17/2018   malignant   BREAST LUMPECTOMY WITH RADIOACTIVE SEED AND SENTINEL LYMPH NODE BIOPSY Left 04/17/2018   Procedure: LEFT BREAST LUMPECTOMY WITH BRACKETED RADIOACTIVE SEEDS AND SENTINEL LYMPH NODE BIOPSY;   Surgeon: Enid Harry, MD;  Location: [facility];  Service: General;  Laterality: Left;   BREAST SURGERY Left    Lumpectomy   CARDIOVASCULAR STRESS TEST  07/19/2006   EF 70%, NO EVIDENCE OF ISCHEMIA   CHOLECYSTECTOMY  12/30/10   COLONOSCOPY     DILATION AND CURETTAGE OF UTERUS     HYSTEROSCOPY WITH D & C N/A 06/07/2018   Procedure: DILATATION AND CURETTAGE /HYSTEROSCOPY;  Surgeon: Thurman Flores, MD;  Location: [facility];  Service: Gynecology;  Laterality: N/A;   inguinal herniography     NASAL SINUS SURGERY     RADIOACTIVE SEED GUIDED EXCISIONAL BREAST BIOPSY Right 04/17/2018   Procedure: RIGHT BREAST SEED GUIDED EXCISIONAL BIOPSY;  Surgeon: Enid Harry, MD;  Location: [facility];  Service: General;  Laterality: Right;   RE-EXCISION OF BREAST LUMPECTOMY Left 05/15/2018   Procedure: RE-EXCISION OF LEFT BREAST LUMPECTOMY, ASPIRATION OF LEFT AXILLARY SEROMA;  Surgeon: Enid Harry, MD;  Location:  SUR[facility]ERY [facility];  Service: General;  Laterality: Left;   T & A     TOOTH EXTRACTION     TOTAL KNEE ARTHROPLASTY Left 09/11/2017   Procedure: LEFT TOTAL KNEE ARTHROPLASTY;  Surgeon: Liliane Rei, MD;  Location: [facility];  Service: Orthopedics;  Laterality: Left;  with block   US  ECHOCARDIOGRAPHY  07/17/2006   EF 55-60%   Patient Active Problem List   Diagnosis Date Noted   Other specified disorders of eustachian tube, right ear 11/15/2023   Chronic rhinitis 11/15/2023   Sensorineural hearing loss, bilateral 11/15/2023   Spinal stenosis of lumbar region with radiculopathy 10/12/2021   Chronic postoperative pain 10/12/2021   Breast mass, right 03/27/2020   Hepatic steatosis 01/22/2020   Genetic testing 04/02/2018   Family history of breast cancer    Malignant neoplasm of upper-outer quadrant of left breast in female, estrogen receptor positive (HCC) 03/16/2018   OA (osteoarthritis) of knee 09/11/2017   Infrapatellar bursitis of right knee  04/29/2014   Pneumonia, aspiration (HCC) 12/28/2012   HYPOTHYROIDISM 11/15/2010   HYPERLIPIDEMIA 11/15/2010   DEPRESSION 11/15/2010   GERD 11/15/2010   CONSTIPATION 11/15/2010   GALLSTONES 11/15/2010   RENAL CYST 11/15/2010   INSOMNIA UNSPECIFIED 11/15/2010   COUGH 11/15/2010   ABDOMINAL PAIN OTHER SPECIFIED SITE 11/15/2010   TRANSAMINASES, SERUM, ELEVATED 11/15/2010    REFERRING PROVIDER: Enid Harry, MD  REFERRING DIAG: Left arm arm swelling  THERAPY DIAG:  Abnormal posture  Left shoulder pain, unspecified chronicity  Lymphedema, not elsewhere classified  ONSET DATE: 2-3 months ago  Rationale for Evaluation and Treatment: Rehabilitation  SUBJECTIVE:                                                                                                                                                                                           SUBJECTIVE STATEMENT:  My garments shipped yesterday! They are coming by [employer] so it'll probably be a few more days.   PERTINENT HISTORY:  04/17/2018 Bilateral lumpectomies with left SLNB with 0+/1 LN. Gr. 2 ILC Er/PR+, HER 2-, Ki 67 5%. Radiation 09/11/2018 -08/03/2018. Most recent prior PT treatment 04/05/2022 to 05/17/22 with this therapist. 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  PAIN:  Are you having pain? No, just a little sore at upper arm from bandages   PRECAUTIONS: Left TKA, sciatica, Lympedema(left), small brain aneurysm  RED FLAGS: None   WEIGHT BEARING RESTRICTIONS: No  FALLS:  Has patient fallen in last 6 months? No  LIVING ENVIRONMENT: Lives with: lives with their spouse Lives in: House/apartment OCCUPATION: retired   LEISURE: reading , gardening  HAND DOMINANCE: right   PRIOR LEVEL OF FUNCTION: Independent  PATIENT GOALS: ease the pain, move better, check swelling   OBJECTIVE: Note: Objective measures were completed at Evaluation unless otherwise noted.  COGNITION: Overall cognitive status: Within functional  limits for tasks assessed   PALPATION: Tender at proximal  biceps, supraspinatus, mid deltoid,tender left pectorals, breast incision on left.  OBSERVATIONS / OTHER ASSESSMENTS: positive left impingement, positive empty  SENSATION: Light touch: Appears intact   POSTURE: forward head, rounded shoulders  UPPER EXTREMITY AROM/PROM:  A/PROM RIGHT   eval   Shoulder extension 65  Shoulder flexion 120  Shoulder abduction 164, slight scaption  Shoulder internal rotation 75  Shoulder external rotation 90    (Blank rows = not tested)  A/PROM LEFT   eval  Shoulder extension 54, tight  Shoulder flexion 120, tight , mild pain at top of shoulder  Shoulder abduction 90  Shoulder internal rotation   Shoulder external rotation     (Blank rows = not tested)  CERVICAL AROM:  within functional limits:     UPPER EXTREMITY STRENGTH: functional but limited by pain on left  LYMPHEDEMA ASSESSMENTS:   SURGERY TYPE/DATE: 04/17/2018 Bilateral lumpectomies with Left SLNB, had a seroma on left  NUMBER OF LYMPH NODES REMOVED: 0+/1  CHEMOTHERAPY: NO  RADIATION:Yes   HORMONE TREATMENT: end 07/2018  INFECTIONS: NO   LYMPHEDEMA ASSESSMENTS:   LANDMARK RIGHT  eval  At axilla  33.0  15 cm proximal to olecranon process 32.3  10 cm proximal to olecranon process 30.8  Olecranon process 25.7  15 cm proximal to ulnar styloid process 23.3  10 cm proximal to ulnar styloid process 20.1  Just proximal to ulnar styloid process 14.2  Across hand at thumb web space 18.0  At base of 2nd digit 6.2  (Blank rows = not tested)  LANDMARK LEFT  eval Left 03/15/24 LEFT 03/20/2024 LEFT 03/22/2024 Left 04/03/24  At axilla  33.7  32.3  31.7  15 cm proximal to olecranon process 33.5  33.3  30.7  10 cm proximal to olecranon process 32.3  31.9 30.7 29.6  Olecranon process 26.8  26.7 26.2 25.4  15 cm proximal to ulnar styloid process 23.3  23.5 23.5 23.5  10 cm proximal to ulnar styloid process 20.0  20.1 20.1  20.4  Just proximal to ulnar styloid process 15.25 15.2 15.2 15.0 14.8  Across hand at thumb web space 18.4  17.8  16.7  At base of 2nd digit 6.1  6.0 5.9 5.8  (Blank rows = not tested)   FUNCTIONAL TESTS:    GAIT: flexed posture,increased kyphosis, independent     QUICK DASH SURVEY: 40.91                                                                                                                            TREATMENT DATE:  04/05/24: 04/03/24: Manual therapy:  MLD to Lt UE: Short neck, superficial and deep abdominals, Lt inguinal nodes and Lt axillo-inguinal anastomosis, Rt axillary and pectoral nodes, anterior intact thorax sequence, and then anterior inter-axillary anastomosis, then Lt UE working from proximal to distal and retracing all steps back to anastomosis and ending with LN's.  P/ROM to Lt shoulder into flex, abd and D2 with scapular depression by  therapist throughout.  STM to Lt axilla at scar tissue, pt with much less sensitivity here now Compression Bandaging to Lt UE as follows: Cocoa butter to Lt UE,  Juzo undersleeve, Mollelast to fingers 1-4, Artiflex to UE, and then 1-6 cm short stretch compression bandage to hand/wrist with less repetitions at hand, 1-8 cm spiral from wrist to "X" at elbow, and then 1-10 cm spiral from wrist to 1/2 in below axilla folding juzo under sleeve over top of bandage  04/03/24: Manual therapy:  MLD to Lt UE: Short neck, superficial and deep abdominals, Lt inguinal nodes and Lt axillo-inguinal anastomosis, Rt axillary and pectoral nodes, anterior intact thorax sequence, and then anterior inter-axillary anastomosis, then Lt UE working from proximal to distal and retracing all steps back to anastomosis and ending with LN's.  P/ROM to Lt shoulder into flex, abd and D2 with scapular depression by therapist throughout.  STM to Lt axilla at scar tissue, pt with much less sensitivity here now Circumference measurements re taken.  Compression  Bandaging to Lt UE as follows: Cocoa butter to Lt UE,  Juzo undersleeve, Mollelast to fingers 1-4, Artiflex to UE, and then 1-6 cm short stretch compression bandage to hand/wrist with less repetitions at hand, 1-8 cm spiral from wrist to "X" at elbow, and then 1-10 cm spiral from wrist to 1/2 in below axilla folding juzo under sleeve over top of bandage.   04/01/24: Manual therapy:  MLD to Lt UE: Short neck, superficial and deep abdominals, Lt inguinal nodes and Lt axillo-inguinal anastomosis, Rt axillary and pectoral nodes, anterior intact thorax sequence, and then anterior inter-axillary anastomosis, then Lt UE working from proximal to distal and retracing all steps back to anastomosis and ending with LN's.  Compression Bandaging to Lt UE as follows: Cocoa butter to Lt UE,  Juzo undersleeve (issued new one so pt could clean current one) , Mollelast to fingers 1-4, Artiflex to UE, and then 1-6 cm short stretch compression bandage to hand/wrist, 1-8 cm spiral from wrist to "X" at elbow, and then 1-10 cm spiral from wrist to 1/2 in below axilla folding juzo under sleeve over top of bandage.   03/28/2024 Orhotic fit :Measured for exostrong sleeve and gauntlet and assisted with order through Compression [employer] 18. Wrist 16, elbow 27.5, axilla 34  size medium ave sleeve and small gauntlet  Manual therapy : MLD to Lt UE: Short neck, superficial and deep abdominals, Lt inguinal nodes and Lt axillo-inguinal anastomosis, Rt axillary and pectoral nodes, anterior intact thorax sequence, and then anterior inter-axillary anastomosis, then Lt UE working from proximal to distal and retracing all steps back to anastomosis and ending with LN's.  P/ROM to Lt shoulder into flex, abd and D2 with scapular depression by therapist throughout.  STM to Lt axilla at scar tissue. This was very hypersensitive but this improved well by end of session.  Compression Bandaging to Lt UE as follows: lotion to Lt UE,  Juzo  undersleeve, Mollelast to fingers 1-4, Artiflex to UE, and then 1-6 cm short stretch compression bandage to hand/wrist, 1-8 cm spiral from wrist to "X" at elbow, and then 1-10 cm spiral from wrist to 1/2 in below axilla folding juzo under sleeve over top of bandage. Pt reports this feeling very comfortable.   03/26/24: Manual Therapy MLD to Lt UE: Short neck, superficial and deep abdominals, Lt inguinal nodes and Lt axillo-inguinal anastomosis, Rt axillary and pectoral nodes, anterior intact thorax sequence, and then anterior inter-axillary anastomosis, then Lt  UE working from proximal to distal and retracing all steps back to anastomosis and ending with LN's.  P/ROM to Lt shoulder into flex, abd and D2 with scapular depression by therapist throughout.  STM to Lt axilla at scar tissue. This was very hypersensitive but this improved well by end of session.  Compression Bandaging to Lt UE as follows: lotion to Lt UE,  Juzo undersleeve, Mollelast to fingers 1-4, Artiflex to UE, and then 1-6 cm short stretch compression bandage to hand/wrist, 1-8 cm spiral from wrist to "X" at elbow, and then 1-10 cm spiral from wrist to 1/2 in below axilla folding juzo under sleeve over top of bandage. Pt reports this feeling very comfortable.        PATIENT EDUCATION:  Education details: see above Person educated: Patient Education method: Explanation Education comprehension: verbalized understanding  HOME EXERCISE PROGRAM: NA  ASSESSMENT:  CLINICAL IMPRESSION: Continued CDT for Lt UE. Pt reports she got shipping confirmation about her garment yesterday so this should arrive in next few days. She reports great benefit from [facility] and P/ROM as well as she has been feeling tight in Lt lateral trunk lately. She returned demo of doorway stretch and reminded her to sprinkle this into her day to help decrease trunk tightness. She verbalized good understanding.    OBJECTIVE IMPAIRMENTS: decreased activity tolerance,  decreased knowledge of condition, decreased ROM, decreased strength, increased edema, impaired UE functional use, postural dysfunction, and pain.   ACTIVITY LIMITATIONS: lifting, sleeping, dressing, and reach over head  PARTICIPATION LIMITATIONS: cleaning and laundry  PERSONAL FACTORS:  Left breast Cancer, radiation are also patient's functional outcome.   REHAB POTENTIAL: Good  CLINICAL DECISION MAKING: Evolving/moderate complexity  EVALUATION COMPLEXITY: Moderate  GOALS: Goals reviewed with patient? Yes  SHORT TERM GOALS: Target date: 04/01/2024  Pt will be independent and compliant with HEP for left shoulder ROM/strength  Baseline: Goal status: INITIAL  2.  Pt will be fit for appropriate flat knit compression sleeve to address left UE lymphedema Baseline:  Goal status: INITIAL  3.  Pt will have decreased left shoulder/axillary pain by 25% or more Baseline:  Goal status: INITIAL  4.  Quick dash will improve to greater than 30% to demonstrate improved function Baseline:  Goal status: INITIAL   LONG TERM GOALS: Target date: 04/22/2024  Pt will have decreased complaints left shoulder/axillary pain  by 50%  Baseline:  Goal status: INITIAL  2.  Pt will have left shoulder ROM WNL as compared to right UE   Baseline:  Goal status: INITIAL  3.  Pts quick dash will be no greater than 22% to demonstrate improved function in left shoulder Baseline:  Goal status: INITIAL  4.  Pt will be educated in self MLD to decrease left UE swelling prn Baseline:  Goal status: INITIAL   PLAN:  PT FREQUENCY: 2-3x/week  PT DURATION: 6 weeks  PLANNED INTERVENTIONS: 97164- PT Re-evaluation, 97110-Therapeutic exercises, 97530- Therapeutic activity, 97112- Neuromuscular re-education, 97535- Self Care, 16109- Manual therapy, [phone]- Orthotic Fit/training, Patient/Family education, and Joint mobilization  PLAN FOR NEXT SESSION: check sleeve/gauntlet when it arrives. Cont STM left UT,  axillary border of pectorals, initiate postural strength with band if tolerated or gentle isometrics if not, MLD to left UE and instruct pt  04/03/24 - Faxed demographics and PT note to [employer] and [facility] custom fit. Also faxed script to Dr .Delane Fear for the nighttime garment. VR  Denyce Flank, PTA 04/05/2024, 12:15 PM

## 2024-04-08 ENCOUNTER — Ambulatory Visit

## 2024-04-08 DIAGNOSIS — M25612 Stiffness of left shoulder, not elsewhere classified: Secondary | ICD-10-CM | POA: Diagnosis not present

## 2024-04-08 DIAGNOSIS — R293 Abnormal posture: Secondary | ICD-10-CM

## 2024-04-08 DIAGNOSIS — C50412 Malignant neoplasm of upper-outer quadrant of left female breast: Secondary | ICD-10-CM

## 2024-04-08 DIAGNOSIS — R600 Localized edema: Secondary | ICD-10-CM

## 2024-04-08 DIAGNOSIS — I890 Lymphedema, not elsewhere classified: Secondary | ICD-10-CM | POA: Diagnosis not present

## 2024-04-08 DIAGNOSIS — M25512 Pain in left shoulder: Secondary | ICD-10-CM

## 2024-04-08 NOTE — Therapy (Signed)
 OUTPATIENT PHYSICAL THERAPY  UPPER EXTREMITY ONCOLOGY TREATMENT  Patient Name: [name] MRN: [id number] DOB:03/17/1946, 78 y.o., female Today's Date: 04/08/2024  END OF SESSION:  PT End of Session - 04/08/24 1446     Visit Number 11    Number of Visits 18    Date for PT Re-Evaluation 04/22/24    Authorization Type Medicare    PT Start Time 1455    PT Stop Time 1557    PT Time Calculation (min) 62 min    Activity Tolerance Patient tolerated treatment well    Behavior During Therapy WFL for tasks assessed/performed             Past Medical History:  Diagnosis Date   Arthritis    hands and knees   Complication of anesthesia    aspiration pna; following a colonoscopy, pt requests head of bed elevated if possible.   Constipation    Cough    Depression    Dysrhythmia    RBBB on 06-06-17 ekg    Elevated liver function tests    Esophageal stricture    Family history of breast cancer    Gallstones    Genetic testing 04/02/2018   STAT Breast panel with reflex to Multi-Cancer panel (83 genes) @ [facility] - No pathogenic mutations detected   GERD (gastroesophageal reflux disease)    Hiatal hernia    History of kidney stones    History of radiation therapy 07/09/2018- 08/03/18   Left Breast/ 40.05 Gy in 15 fractions. Left Breast boost 10 Gy in 5 fractions.    Hyperlipemia    Hypothyroidism    Insomnia    Malignant neoplasm of upper-outer quadrant of left female breast (HCC)    Nephrolithiasis    Personal history of radiation therapy    Pneumonia 2015   aspirated after colonosopy   Renal cyst    Past Surgical History:  Procedure Laterality Date   BREAST BIOPSY Left 05/31/2019   mri   BREAST BIOPSY Left 04/2019   malignant   BREAST LUMPECTOMY Left 04/17/2018   malignant   BREAST LUMPECTOMY WITH RADIOACTIVE SEED AND SENTINEL LYMPH NODE BIOPSY Left 04/17/2018   Procedure: LEFT BREAST LUMPECTOMY WITH BRACKETED RADIOACTIVE SEEDS AND SENTINEL LYMPH NODE BIOPSY;   Surgeon: Enid Harry, MD;  Location: [facility];  Service: General;  Laterality: Left;   BREAST SURGERY Left    Lumpectomy   CARDIOVASCULAR STRESS TEST  07/19/2006   EF 70%, NO EVIDENCE OF ISCHEMIA   CHOLECYSTECTOMY  12/30/10   COLONOSCOPY     DILATION AND CURETTAGE OF UTERUS     HYSTEROSCOPY WITH D & C N/A 06/07/2018   Procedure: DILATATION AND CURETTAGE /HYSTEROSCOPY;  Surgeon: Thurman Flores, MD;  Location: [facility];  Service: Gynecology;  Laterality: N/A;   inguinal herniography     NASAL SINUS SURGERY     RADIOACTIVE SEED GUIDED EXCISIONAL BREAST BIOPSY Right 04/17/2018   Procedure: RIGHT BREAST SEED GUIDED EXCISIONAL BIOPSY;  Surgeon: Enid Harry, MD;  Location: [facility];  Service: General;  Laterality: Right;   RE-EXCISION OF BREAST LUMPECTOMY Left 05/15/2018   Procedure: RE-EXCISION OF LEFT BREAST LUMPECTOMY, ASPIRATION OF LEFT AXILLARY SEROMA;  Surgeon: Enid Harry, MD;  Location: [facility];  Service: General;  Laterality: Left;   T & A     TOOTH EXTRACTION     TOTAL KNEE ARTHROPLASTY Left 09/11/2017   Procedure: LEFT TOTAL KNEE ARTHROPLASTY;  Surgeon: Liliane Rei, MD;  Location: [facility];  Service: Orthopedics;  Laterality: Left;  with block   US  ECHOCARDIOGRAPHY  07/17/2006   EF 55-60%   Patient Active Problem List   Diagnosis Date Noted   Other specified disorders of eustachian tube, right ear 11/15/2023   Chronic rhinitis 11/15/2023   Sensorineural hearing loss, bilateral 11/15/2023   Spinal stenosis of lumbar region with radiculopathy 10/12/2021   Chronic postoperative pain 10/12/2021   Breast mass, right 03/27/2020   Hepatic steatosis 01/22/2020   Genetic testing 04/02/2018   Family history of breast cancer    Malignant neoplasm of upper-outer quadrant of left breast in female, estrogen receptor positive (HCC) 03/16/2018   OA (osteoarthritis) of knee 09/11/2017   Infrapatellar bursitis of right knee  04/29/2014   Pneumonia, aspiration (HCC) 12/28/2012   HYPOTHYROIDISM 11/15/2010   HYPERLIPIDEMIA 11/15/2010   DEPRESSION 11/15/2010   GERD 11/15/2010   CONSTIPATION 11/15/2010   GALLSTONES 11/15/2010   RENAL CYST 11/15/2010   INSOMNIA UNSPECIFIED 11/15/2010   COUGH 11/15/2010   ABDOMINAL PAIN OTHER SPECIFIED SITE 11/15/2010   TRANSAMINASES, SERUM, ELEVATED 11/15/2010    REFERRING PROVIDER: Enid Harry, MD  REFERRING DIAG: Left arm arm swelling  THERAPY DIAG:  Abnormal posture  Left shoulder pain, unspecified chronicity  Lymphedema, not elsewhere classified  ONSET DATE: 2-3 months ago  Rationale for Evaluation and Treatment: Rehabilitation  SUBJECTIVE:                                                                                                                                                                                           SUBJECTIVE STATEMENT:  I got the garments on Friday but I did not try them on. I had to take the wrap off on Saturday.   PERTINENT HISTORY:  04/17/2018 Bilateral lumpectomies with left SLNB with 0+/1 LN. Gr. 2 ILC Er/PR+, HER 2-, Ki 67 5%. Radiation 09/11/2018 -08/03/2018. Most recent prior PT treatment 04/05/2022 to 05/17/22 with this therapist. 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  PAIN:  Are you having pain? No, just a little sore at upper arm from bandages   PRECAUTIONS: Left TKA, sciatica, Lympedema(left), small brain aneurysm  RED FLAGS: None   WEIGHT BEARING RESTRICTIONS: No  FALLS:  Has patient fallen in last 6 months? No  LIVING ENVIRONMENT: Lives with: lives with their spouse Lives in: House/apartment OCCUPATION: retired   LEISURE: reading , gardening  HAND DOMINANCE: right   PRIOR LEVEL OF FUNCTION: Independent  PATIENT GOALS: ease the pain, move better, check swelling   OBJECTIVE: Note: Objective measures were completed at Evaluation unless otherwise noted.  COGNITION: Overall cognitive status: Within  functional limits for tasks assessed  PALPATION: Tender at proximal biceps, supraspinatus, mid deltoid,tender left pectorals, breast incision on left.  OBSERVATIONS / OTHER ASSESSMENTS: positive left impingement, positive empty  SENSATION: Light touch: Appears intact   POSTURE: forward head, rounded shoulders  UPPER EXTREMITY AROM/PROM:  A/PROM RIGHT   eval   Shoulder extension 65  Shoulder flexion 120  Shoulder abduction 164, slight scaption  Shoulder internal rotation 75  Shoulder external rotation 90    (Blank rows = not tested)  A/PROM LEFT   eval  Shoulder extension 54, tight  Shoulder flexion 120, tight , mild pain at top of shoulder  Shoulder abduction 90  Shoulder internal rotation   Shoulder external rotation     (Blank rows = not tested)  CERVICAL AROM:  within functional limits:     UPPER EXTREMITY STRENGTH: functional but limited by pain on left  LYMPHEDEMA ASSESSMENTS:   SURGERY TYPE/DATE: 04/17/2018 Bilateral lumpectomies with Left SLNB, had a seroma on left  NUMBER OF LYMPH NODES REMOVED: 0+/1  CHEMOTHERAPY: NO  RADIATION:Yes   HORMONE TREATMENT: end 07/2018  INFECTIONS: NO   LYMPHEDEMA ASSESSMENTS:   LANDMARK RIGHT  eval  At axilla  33.0  15 cm proximal to olecranon process 32.3  10 cm proximal to olecranon process 30.8  Olecranon process 25.7  15 cm proximal to ulnar styloid process 23.3  10 cm proximal to ulnar styloid process 20.1  Just proximal to ulnar styloid process 14.2  Across hand at thumb web space 18.0  At base of 2nd digit 6.2  (Blank rows = not tested)  LANDMARK LEFT  eval Left 03/15/24 LEFT 03/20/2024 LEFT 03/22/2024 Left 04/03/24  At axilla  33.7  32.3  31.7  15 cm proximal to olecranon process 33.5  33.3  30.7  10 cm proximal to olecranon process 32.3  31.9 30.7 29.6  Olecranon process 26.8  26.7 26.2 25.4  15 cm proximal to ulnar styloid process 23.3  23.5 23.5 23.5  10 cm proximal to ulnar styloid process 20.0   20.1 20.1 20.4  Just proximal to ulnar styloid process 15.25 15.2 15.2 15.0 14.8  Across hand at thumb web space 18.4  17.8  16.7  At base of 2nd digit 6.1  6.0 5.9 5.8  (Blank rows = not tested)   FUNCTIONAL TESTS:    GAIT: flexed posture,increased kyphosis, independent     QUICK DASH SURVEY: 40.91                                                                                                                            TREATMENT DATE:   04/08/2024 Pt brought in small exostrong gauntlet and medium sleeve. Gauntlet was much to small and pt will return for a medium. She willl call when she returns home today. Tried on pts sleeve and she was able to don with PT cueing and using gloves. Seems to be a good fit. Removed for treatment. STM to left UT/levator, pectorals and lateral trunk with  cocoa butter PROM left shoulder flexion, scaption, abduction, ER In supine: Short neck, 5 diaphragmatic breaths, R axillary nodes and establishment of interaxillary pathway, L inguinal nodes and establishment of axilloinguinal pathway, then L UE working proximal to distal, moving fluid from upper inner arm outwards, and doing both sides of forearm moving fluid towards pathways spending extra time in any areas of fibrosis then retracing all steps . At completion of MLD instructed pt in use of EZ slide which worked well to get her sleeve on easily, but didn't hold sleeve enough when therapist removed EZ and it slid down. Used gloves to get sleeve up high enough. Pt is to try sleeve without gauntlet and may wear as long as her hand/fingers don't swell, and remove tonight before bed time. She may reapply tomorrow again watching to be sure her hand is not swelling. If it does swell she will discontinue sleeve. She is to wear or bring sleeve/bandages next visit.  04/05/24: Manual therapy:  MLD to Lt UE: Short neck, superficial and deep abdominals, Lt inguinal nodes and Lt axillo-inguinal anastomosis, Rt axillary  and pectoral nodes, anterior intact thorax sequence, and then anterior inter-axillary anastomosis, then Lt UE working from proximal to distal and retracing all steps back to anastomosis and ending with LN's.  P/ROM to Lt shoulder into flex, abd and D2 with scapular depression by therapist throughout.  STM to Lt axilla at scar tissue, pt with much less sensitivity here now Compression Bandaging to Lt UE as follows: Cocoa butter to Lt UE,  Juzo undersleeve, Mollelast to fingers 1-4, Artiflex to UE, and then 1-6 cm short stretch compression bandage to hand/wrist with less repetitions at hand, 1-8 cm spiral from wrist to "X" at elbow, and then 1-10 cm spiral from wrist to 1/2 in below axilla folding juzo under sleeve over top of bandage  04/03/24: Manual therapy:  MLD to Lt UE: Short neck, superficial and deep abdominals, Lt inguinal nodes and Lt axillo-inguinal anastomosis, Rt axillary and pectoral nodes, anterior intact thorax sequence, and then anterior inter-axillary anastomosis, then Lt UE working from proximal to distal and retracing all steps back to anastomosis and ending with LN's.  P/ROM to Lt shoulder into flex, abd and D2 with scapular depression by therapist throughout.  STM to Lt axilla at scar tissue, pt with much less sensitivity here now Compression Bandaging to Lt UE as follows: Cocoa butter to Lt UE,  Juzo undersleeve, Mollelast to fingers 1-4, Artiflex to UE, and then 1-6 cm short stretch compression bandage to hand/wrist with less repetitions at hand, 1-8 cm spiral from wrist to "X" at elbow, and then 1-10 cm spiral from wrist to 1/2 in below axilla folding juzo under sleeve over top of bandage  04/03/24: Manual therapy:  MLD to Lt UE: Short neck, superficial and deep abdominals, Lt inguinal nodes and Lt axillo-inguinal anastomosis, Rt axillary and pectoral nodes, anterior intact thorax sequence, and then anterior inter-axillary anastomosis, then Lt UE working from proximal to distal and  retracing all steps back to anastomosis and ending with LN's.  P/ROM to Lt shoulder into flex, abd and D2 with scapular depression by therapist throughout.  STM to Lt axilla at scar tissue, pt with much less sensitivity here now Circumference measurements re taken.  Compression Bandaging to Lt UE as follows: Cocoa butter to Lt UE,  Juzo undersleeve, Mollelast to fingers 1-4, Artiflex to UE, and then 1-6 cm short stretch compression bandage to hand/wrist with less repetitions at hand, 1-8 cm  spiral from wrist to "X" at elbow, and then 1-10 cm spiral from wrist to 1/2 in below axilla folding juzo under sleeve over top of bandage.   04/01/24: Manual therapy:  MLD to Lt UE: Short neck, superficial and deep abdominals, Lt inguinal nodes and Lt axillo-inguinal anastomosis, Rt axillary and pectoral nodes, anterior intact thorax sequence, and then anterior inter-axillary anastomosis, then Lt UE working from proximal to distal and retracing all steps back to anastomosis and ending with LN's.  Compression Bandaging to Lt UE as follows: Cocoa butter to Lt UE,  Juzo undersleeve (issued new one so pt could clean current one) , Mollelast to fingers 1-4, Artiflex to UE, and then 1-6 cm short stretch compression bandage to hand/wrist, 1-8 cm spiral from wrist to "X" at elbow, and then 1-10 cm spiral from wrist to 1/2 in below axilla folding juzo under sleeve over top of bandage.   03/28/2024 Orhotic fit :Measured for exostrong sleeve and gauntlet and assisted with order through Compression [employer] 18. Wrist 16, elbow 27.5, axilla 34  size medium ave sleeve and small gauntlet  Manual therapy : MLD to Lt UE: Short neck, superficial and deep abdominals, Lt inguinal nodes and Lt axillo-inguinal anastomosis, Rt axillary and pectoral nodes, anterior intact thorax sequence, and then anterior inter-axillary anastomosis, then Lt UE working from proximal to distal and retracing all steps back to anastomosis and ending with  LN's.  P/ROM to Lt shoulder into flex, abd and D2 with scapular depression by therapist throughout.  STM to Lt axilla at scar tissue. This was very hypersensitive but this improved well by end of session.  Compression Bandaging to Lt UE as follows: lotion to Lt UE,  Juzo undersleeve, Mollelast to fingers 1-4, Artiflex to UE, and then 1-6 cm short stretch compression bandage to hand/wrist, 1-8 cm spiral from wrist to "X" at elbow, and then 1-10 cm spiral from wrist to 1/2 in below axilla folding juzo under sleeve over top of bandage. Pt reports this feeling very comfortable.   03/26/24: Manual Therapy MLD to Lt UE: Short neck, superficial and deep abdominals, Lt inguinal nodes and Lt axillo-inguinal anastomosis, Rt axillary and pectoral nodes, anterior intact thorax sequence, and then anterior inter-axillary anastomosis, then Lt UE working from proximal to distal and retracing all steps back to anastomosis and ending with LN's.  P/ROM to Lt shoulder into flex, abd and D2 with scapular depression by therapist throughout.  STM to Lt axilla at scar tissue. This was very hypersensitive but this improved well by end of session.  Compression Bandaging to Lt UE as follows: lotion to Lt UE,  Juzo undersleeve, Mollelast to fingers 1-4, Artiflex to UE, and then 1-6 cm short stretch compression bandage to hand/wrist, 1-8 cm spiral from wrist to "X" at elbow, and then 1-10 cm spiral from wrist to 1/2 in below axilla folding juzo under sleeve over top of bandage. Pt reports this feeling very comfortable.        PATIENT EDUCATION:  Education details: see above Person educated: Patient Education method: Explanation Education comprehension: verbalized understanding  HOME EXERCISE PROGRAM: NA  ASSESSMENT:  CLINICAL IMPRESSION: Exostrong soft gauntlet much too small. Pt will call and  order the medium. Medium sleeve seems to fit well. She is to try wearing today and remove tonight, but advised to remove  immediately if her hand swells. She may wear again tomorrow unless her hand swells.Pt is also interested in the circaid night garment.  OBJECTIVE IMPAIRMENTS: decreased  activity tolerance, decreased knowledge of condition, decreased ROM, decreased strength, increased edema, impaired UE functional use, postural dysfunction, and pain.   ACTIVITY LIMITATIONS: lifting, sleeping, dressing, and reach over head  PARTICIPATION LIMITATIONS: cleaning and laundry  PERSONAL FACTORS:  Left breast Cancer, radiation are also patient's functional outcome.   REHAB POTENTIAL: Good  CLINICAL DECISION MAKING: Evolving/moderate complexity  EVALUATION COMPLEXITY: Moderate  GOALS: Goals reviewed with patient? Yes  SHORT TERM GOALS: Target date: 04/01/2024  Pt will be independent and compliant with HEP for left shoulder ROM/strength  Baseline: Goal status: INITIAL  2.  Pt will be fit for appropriate flat knit compression sleeve to address left UE lymphedema Baseline:  Goal status: INITIAL  3.  Pt will have decreased left shoulder/axillary pain by 25% or more Baseline:  Goal status: INITIAL  4.  Quick dash will improve to greater than 30% to demonstrate improved function Baseline:  Goal status: INITIAL   LONG TERM GOALS: Target date: 04/22/2024  Pt will have decreased complaints left shoulder/axillary pain  by 50%  Baseline:  Goal status: INITIAL  2.  Pt will have left shoulder ROM WNL as compared to right UE   Baseline:  Goal status: INITIAL  3.  Pts quick dash will be no greater than 22% to demonstrate improved function in left shoulder Baseline:  Goal status: INITIAL  4.  Pt will be educated in self MLD to decrease left UE swelling prn Baseline:  Goal status: INITIAL   PLAN:  PT FREQUENCY: 2-3x/week  PT DURATION: 6 weeks  PLANNED INTERVENTIONS: 97164- PT Re-evaluation, 97110-Therapeutic exercises, 97530- Therapeutic activity, 97112- Neuromuscular re-education, 97535- Self Care,  97140- Manual therapy, [phone]- Orthotic Fit/training, Patient/Family education, and Joint mobilization  PLAN FOR NEXT SESSION: check sleeve/gauntlet when it arrives. Cont STM left UT, axillary border of pectorals, initiate postural strength with band if tolerated or gentle isometrics if not, MLD to left UE and instruct pt  04/03/24 - Faxed demographics and PT note to [employer] and [facility] custom fit. Also faxed script to Dr .Delane Fear for the nighttime garment. VR  Latisha Poland, PT 04/08/2024, 5:20 PM

## 2024-04-10 ENCOUNTER — Ambulatory Visit

## 2024-04-10 DIAGNOSIS — M25512 Pain in left shoulder: Secondary | ICD-10-CM

## 2024-04-10 DIAGNOSIS — R293 Abnormal posture: Secondary | ICD-10-CM

## 2024-04-10 DIAGNOSIS — R600 Localized edema: Secondary | ICD-10-CM | POA: Diagnosis not present

## 2024-04-10 DIAGNOSIS — I890 Lymphedema, not elsewhere classified: Secondary | ICD-10-CM | POA: Diagnosis not present

## 2024-04-10 DIAGNOSIS — C50412 Malignant neoplasm of upper-outer quadrant of left female breast: Secondary | ICD-10-CM | POA: Diagnosis not present

## 2024-04-10 DIAGNOSIS — M25612 Stiffness of left shoulder, not elsewhere classified: Secondary | ICD-10-CM | POA: Diagnosis not present

## 2024-04-10 NOTE — Therapy (Signed)
 OUTPATIENT PHYSICAL THERAPY  UPPER EXTREMITY ONCOLOGY TREATMENT  Patient Name: [name] MRN: [id number] DOB:Feb 04, 1946, 78 y.o., female Today's Date: 04/10/2024  END OF SESSION:  PT End of Session - 04/10/24 1502     Visit Number 12    Number of Visits 18    Date for PT Re-Evaluation 04/22/24    Authorization Type Medicare    PT Start Time 1456    PT Stop Time 1559    PT Time Calculation (min) 63 min    Activity Tolerance Patient tolerated treatment well    Behavior During Therapy WFL for tasks assessed/performed             Past Medical History:  Diagnosis Date   Arthritis    hands and knees   Complication of anesthesia    aspiration pna; following a colonoscopy, pt requests head of bed elevated if possible.   Constipation    Cough    Depression    Dysrhythmia    RBBB on 06-06-17 ekg    Elevated liver function tests    Esophageal stricture    Family history of breast cancer    Gallstones    Genetic testing 04/02/2018   STAT Breast panel with reflex to Multi-Cancer panel (83 genes) @ [facility] - No pathogenic mutations detected   GERD (gastroesophageal reflux disease)    Hiatal hernia    History of kidney stones    History of radiation therapy 07/09/2018- 08/03/18   Left Breast/ 40.05 Gy in 15 fractions. Left Breast boost 10 Gy in 5 fractions.    Hyperlipemia    Hypothyroidism    Insomnia    Malignant neoplasm of upper-outer quadrant of left female breast (HCC)    Nephrolithiasis    Personal history of radiation therapy    Pneumonia 2015   aspirated after colonosopy   Renal cyst    Past Surgical History:  Procedure Laterality Date   BREAST BIOPSY Left 05/31/2019   mri   BREAST BIOPSY Left 04/2019   malignant   BREAST LUMPECTOMY Left 04/17/2018   malignant   BREAST LUMPECTOMY WITH RADIOACTIVE SEED AND SENTINEL LYMPH NODE BIOPSY Left 04/17/2018   Procedure: LEFT BREAST LUMPECTOMY WITH BRACKETED RADIOACTIVE SEEDS AND SENTINEL LYMPH NODE BIOPSY;   Surgeon: Enid Harry, MD;  Location: [facility];  Service: General;  Laterality: Left;   BREAST SURGERY Left    Lumpectomy   CARDIOVASCULAR STRESS TEST  07/19/2006   EF 70%, NO EVIDENCE OF ISCHEMIA   CHOLECYSTECTOMY  12/30/10   COLONOSCOPY     DILATION AND CURETTAGE OF UTERUS     HYSTEROSCOPY WITH D & C N/A 06/07/2018   Procedure: DILATATION AND CURETTAGE /HYSTEROSCOPY;  Surgeon: Thurman Flores, MD;  Location: [facility];  Service: Gynecology;  Laterality: N/A;   inguinal herniography     NASAL SINUS SURGERY     RADIOACTIVE SEED GUIDED EXCISIONAL BREAST BIOPSY Right 04/17/2018   Procedure: RIGHT BREAST SEED GUIDED EXCISIONAL BIOPSY;  Surgeon: Enid Harry, MD;  Location: [facility];  Service: General;  Laterality: Right;   RE-EXCISION OF BREAST LUMPECTOMY Left 05/15/2018   Procedure: RE-EXCISION OF LEFT BREAST LUMPECTOMY, ASPIRATION OF LEFT AXILLARY SEROMA;  Surgeon: Enid Harry, MD;  Location: [facility];  Service: General;  Laterality: Left;   T & A     TOOTH EXTRACTION     TOTAL KNEE ARTHROPLASTY Left 09/11/2017   Procedure: LEFT TOTAL KNEE ARTHROPLASTY;  Surgeon: Liliane Rei, MD;  Location: [facility];  Service: Orthopedics;  Laterality: Left;  with block   US  ECHOCARDIOGRAPHY  07/17/2006   EF 55-60%   Patient Active Problem List   Diagnosis Date Noted   Other specified disorders of eustachian tube, right ear 11/15/2023   Chronic rhinitis 11/15/2023   Sensorineural hearing loss, bilateral 11/15/2023   Spinal stenosis of lumbar region with radiculopathy 10/12/2021   Chronic postoperative pain 10/12/2021   Breast mass, right 03/27/2020   Hepatic steatosis 01/22/2020   Genetic testing 04/02/2018   Family history of breast cancer    Malignant neoplasm of upper-outer quadrant of left breast in female, estrogen receptor positive (HCC) 03/16/2018   OA (osteoarthritis) of knee 09/11/2017   Infrapatellar bursitis of right knee  04/29/2014   Pneumonia, aspiration (HCC) 12/28/2012   HYPOTHYROIDISM 11/15/2010   HYPERLIPIDEMIA 11/15/2010   DEPRESSION 11/15/2010   GERD 11/15/2010   CONSTIPATION 11/15/2010   GALLSTONES 11/15/2010   RENAL CYST 11/15/2010   INSOMNIA UNSPECIFIED 11/15/2010   COUGH 11/15/2010   ABDOMINAL PAIN OTHER SPECIFIED SITE 11/15/2010   TRANSAMINASES, SERUM, ELEVATED 11/15/2010    REFERRING PROVIDER: Enid Harry, MD  REFERRING DIAG: Left arm arm swelling  THERAPY DIAG:  Abnormal posture  Left shoulder pain, unspecified chronicity  Lymphedema, not elsewhere classified  ONSET DATE: 2-3 months ago  Rationale for Evaluation and Treatment: Rehabilitation  SUBJECTIVE:                                                                                                                                                                                           SUBJECTIVE STATEMENT:  I've been wearing the sleeve and that seems to be going well. My husband has to help me put it on. It's hard for him though so I ordered gloves to help him off [employer].   PERTINENT HISTORY:  04/17/2018 Bilateral lumpectomies with left SLNB with 0+/1 LN. Gr. 2 ILC Er/PR+, HER 2-, Ki 67 5%. Radiation 09/11/2018 -08/03/2018. Most recent prior PT treatment 04/05/2022 to 05/17/22 with this therapist. 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  PAIN:  Are you having pain? No  PRECAUTIONS: Left TKA, sciatica, Lympedema(left), small brain aneurysm  RED FLAGS: None   WEIGHT BEARING RESTRICTIONS: No  FALLS:  Has patient fallen in last 6 months? No  LIVING ENVIRONMENT: Lives with: lives with their spouse Lives in: House/apartment OCCUPATION: retired   LEISURE: reading , gardening  HAND DOMINANCE: right   PRIOR LEVEL OF FUNCTION: Independent  PATIENT GOALS: ease the pain, move better, check swelling   OBJECTIVE: Note: Objective measures were completed at Evaluation unless otherwise noted.  COGNITION: Overall  cognitive status: Within functional limits for  tasks assessed   PALPATION: Tender at proximal biceps, supraspinatus, mid deltoid,tender left pectorals, breast incision on left.  OBSERVATIONS / OTHER ASSESSMENTS: positive left impingement, positive empty  SENSATION: Light touch: Appears intact   POSTURE: forward head, rounded shoulders  UPPER EXTREMITY AROM/PROM:  A/PROM RIGHT   eval   Shoulder extension 65  Shoulder flexion 120  Shoulder abduction 164, slight scaption  Shoulder internal rotation 75  Shoulder external rotation 90    (Blank rows = not tested)  A/PROM LEFT   eval  Shoulder extension 54, tight  Shoulder flexion 120, tight , mild pain at top of shoulder  Shoulder abduction 90  Shoulder internal rotation   Shoulder external rotation     (Blank rows = not tested)  CERVICAL AROM:  within functional limits:     UPPER EXTREMITY STRENGTH: functional but limited by pain on left  LYMPHEDEMA ASSESSMENTS:   SURGERY TYPE/DATE: 04/17/2018 Bilateral lumpectomies with Left SLNB, had a seroma on left  NUMBER OF LYMPH NODES REMOVED: 0+/1  CHEMOTHERAPY: NO  RADIATION:Yes   HORMONE TREATMENT: end 07/2018  INFECTIONS: NO   LYMPHEDEMA ASSESSMENTS:   LANDMARK RIGHT  eval  At axilla  33.0  15 cm proximal to olecranon process 32.3  10 cm proximal to olecranon process 30.8  Olecranon process 25.7  15 cm proximal to ulnar styloid process 23.3  10 cm proximal to ulnar styloid process 20.1  Just proximal to ulnar styloid process 14.2  Across hand at thumb web space 18.0  At base of 2nd digit 6.2  (Blank rows = not tested)  LANDMARK LEFT  eval Left 03/15/24 LEFT 03/20/2024 LEFT 03/22/2024 Left 04/03/24  At axilla  33.7  32.3  31.7  15 cm proximal to olecranon process 33.5  33.3  30.7  10 cm proximal to olecranon process 32.3  31.9 30.7 29.6  Olecranon process 26.8  26.7 26.2 25.4  15 cm proximal to ulnar styloid process 23.3  23.5 23.5 23.5  10 cm proximal to  ulnar styloid process 20.0  20.1 20.1 20.4  Just proximal to ulnar styloid process 15.25 15.2 15.2 15.0 14.8  Across hand at thumb web space 18.4  17.8  16.7  At base of 2nd digit 6.1  6.0 5.9 5.8  (Blank rows = not tested)   FUNCTIONAL TESTS:    GAIT: flexed posture,increased kyphosis, independent     QUICK DASH SURVEY: 40.91                                                                                                                            TREATMENT DATE:  04/10/24: Self Care Assisted pt with reordering a medium size gauntlet as she returned the size small yesterday. This should arrive in 4-8 days. Also called [employer] for pt to verify that they are going to credit her card and not her [employer] account.  Manual Therapy MLD to Lt UE: Short neck, superficial and deep abdominals, Lt inguinal nodes  and Lt axillo-inguinal anastomosis, Rt axillary and pectoral nodes, anterior intact thorax sequence, and then anterior inter-axillary anastomosis, then Lt UE working from proximal to distal and retracing all steps back to anastomosis and ending with LN's.  P/ROM to Lt shoulder into flex, abd and D2 with scapular depression by therapist throughout; end motions are improved today STM to Lt axilla at scar tissue and pect insertion that was palpably tight today, overall scar tissue is much softer now and pt with no tenderness Donned pts compression sleeve after manual therapy  04/08/2024 Pt brought in small exostrong gauntlet and medium sleeve. Gauntlet was much to small and pt will return for a medium. She willl call when she returns home today. Tried on pts sleeve and she was able to don with PT cueing and using gloves. Seems to be a good fit. Removed for treatment. STM to left UT/levator, pectorals and lateral trunk with cocoa butter PROM left shoulder flexion, scaption, abduction, ER In supine: Short neck, 5 diaphragmatic breaths, R axillary nodes and establishment of  interaxillary pathway, L inguinal nodes and establishment of axilloinguinal pathway, then L UE working proximal to distal, moving fluid from upper inner arm outwards, and doing both sides of forearm moving fluid towards pathways spending extra time in any areas of fibrosis then retracing all steps . At completion of MLD instructed pt in use of EZ slide which worked well to get her sleeve on easily, but didn't hold sleeve enough when therapist removed EZ and it slid down. Used gloves to get sleeve up high enough. Pt is to try sleeve without gauntlet and may wear as long as her hand/fingers don't swell, and remove tonight before bed time. She may reapply tomorrow again watching to be sure her hand is not swelling. If it does swell she will discontinue sleeve. She is to wear or bring sleeve/bandages next visit.  04/05/24: Manual therapy:  MLD to Lt UE: Short neck, superficial and deep abdominals, Lt inguinal nodes and Lt axillo-inguinal anastomosis, Rt axillary and pectoral nodes, anterior intact thorax sequence, and then anterior inter-axillary anastomosis, then Lt UE working from proximal to distal and retracing all steps back to anastomosis and ending with LN's.  P/ROM to Lt shoulder into flex, abd and D2 with scapular depression by therapist throughout.  STM to Lt axilla at scar tissue, pt with much less sensitivity here now Compression Bandaging to Lt UE as follows: Cocoa butter to Lt UE,  Juzo undersleeve, Mollelast to fingers 1-4, Artiflex to UE, and then 1-6 cm short stretch compression bandage to hand/wrist with less repetitions at hand, 1-8 cm spiral from wrist to "X" at elbow, and then 1-10 cm spiral from wrist to 1/2 in below axilla folding juzo under sleeve over top of bandage       PATIENT EDUCATION:  Education details: see above Person educated: Patient Education method: Explanation Education comprehension: verbalized understanding  HOME EXERCISE  PROGRAM: NA  ASSESSMENT:  CLINICAL IMPRESSION: Assisted pt with ordering a medium size Manufacturing systems engineer. Then continued with MLD and STM to Lt upper quadrant.   OBJECTIVE IMPAIRMENTS: decreased activity tolerance, decreased knowledge of condition, decreased ROM, decreased strength, increased edema, impaired UE functional use, postural dysfunction, and pain.   ACTIVITY LIMITATIONS: lifting, sleeping, dressing, and reach over head  PARTICIPATION LIMITATIONS: cleaning and laundry  PERSONAL FACTORS:  Left breast Cancer, radiation are also patient's functional outcome.   REHAB POTENTIAL: Good  CLINICAL DECISION MAKING: Evolving/moderate complexity  EVALUATION COMPLEXITY: Moderate  GOALS: Goals  reviewed with patient? Yes  SHORT TERM GOALS: Target date: 04/01/2024  Pt will be independent and compliant with HEP for left shoulder ROM/strength  Baseline: Goal status: INITIAL  2.  Pt will be fit for appropriate flat knit compression sleeve to address left UE lymphedema Baseline:  Goal status: INITIAL  3.  Pt will have decreased left shoulder/axillary pain by 25% or more Baseline:  Goal status: INITIAL  4.  Quick dash will improve to greater than 30% to demonstrate improved function Baseline:  Goal status: INITIAL   LONG TERM GOALS: Target date: 04/22/2024  Pt will have decreased complaints left shoulder/axillary pain  by 50%  Baseline:  Goal status: INITIAL  2.  Pt will have left shoulder ROM WNL as compared to right UE   Baseline:  Goal status: INITIAL  3.  Pts quick dash will be no greater than 22% to demonstrate improved function in left shoulder Baseline:  Goal status: INITIAL  4.  Pt will be educated in self MLD to decrease left UE swelling prn Baseline:  Goal status: INITIAL   PLAN:  PT FREQUENCY: 2-3x/week  PT DURATION: 6 weeks  PLANNED INTERVENTIONS: 97164- PT Re-evaluation, 97110-Therapeutic exercises, 97530- Therapeutic activity, 97112-  Neuromuscular re-education, 97535- Self Care, 16109- Manual therapy, [phone]- Orthotic Fit/training, Patient/Family education, and Joint mobilization  PLAN FOR NEXT SESSION: check sleeve/gauntlet when it arrives. Cont STM left UT, axillary border of pectorals, try supine scapular series or gentle isometrics if not tolerated, MLD to left UE and instruct pt  04/03/24 - Faxed demographics and PT note to [employer] and [facility] custom fit. Also faxed script to Dr .Delane Fear for the nighttime garment. VR  Denyce Flank, PTA 04/10/2024, 5:11 PM

## 2024-04-12 ENCOUNTER — Ambulatory Visit: Attending: General Surgery

## 2024-04-12 DIAGNOSIS — R293 Abnormal posture: Secondary | ICD-10-CM

## 2024-04-12 DIAGNOSIS — M25612 Stiffness of left shoulder, not elsewhere classified: Secondary | ICD-10-CM | POA: Diagnosis not present

## 2024-04-12 DIAGNOSIS — I890 Lymphedema, not elsewhere classified: Secondary | ICD-10-CM | POA: Diagnosis not present

## 2024-04-12 DIAGNOSIS — R600 Localized edema: Secondary | ICD-10-CM

## 2024-04-12 DIAGNOSIS — M25512 Pain in left shoulder: Secondary | ICD-10-CM | POA: Diagnosis not present

## 2024-04-12 NOTE — Therapy (Signed)
 OUTPATIENT PHYSICAL THERAPY  UPPER EXTREMITY ONCOLOGY TREATMENT  Patient Name: [name] MRN: [id number] DOB:06-03-1946, 78 y.o., female Today's Date: 04/12/2024  END OF SESSION:  PT End of Session - 04/12/24 1117     Visit Number 13    Number of Visits 18    Date for PT Re-Evaluation 04/22/24    Authorization Type Medicare    PT Start Time 1103    PT Stop Time 1201    PT Time Calculation (min) 58 min    Activity Tolerance Patient tolerated treatment well    Behavior During Therapy WFL for tasks assessed/performed             Past Medical History:  Diagnosis Date   Arthritis    hands and knees   Complication of anesthesia    aspiration pna; following a colonoscopy, pt requests head of bed elevated if possible.   Constipation    Cough    Depression    Dysrhythmia    RBBB on 06-06-17 ekg    Elevated liver function tests    Esophageal stricture    Family history of breast cancer    Gallstones    Genetic testing 04/02/2018   STAT Breast panel with reflex to Multi-Cancer panel (83 genes) @ [facility] - No pathogenic mutations detected   GERD (gastroesophageal reflux disease)    Hiatal hernia    History of kidney stones    History of radiation therapy 07/09/2018- 08/03/18   Left Breast/ 40.05 Gy in 15 fractions. Left Breast boost 10 Gy in 5 fractions.    Hyperlipemia    Hypothyroidism    Insomnia    Malignant neoplasm of upper-outer quadrant of left female breast (HCC)    Nephrolithiasis    Personal history of radiation therapy    Pneumonia 2015   aspirated after colonosopy   Renal cyst    Past Surgical History:  Procedure Laterality Date   BREAST BIOPSY Left 05/31/2019   mri   BREAST BIOPSY Left 04/2019   malignant   BREAST LUMPECTOMY Left 04/17/2018   malignant   BREAST LUMPECTOMY WITH RADIOACTIVE SEED AND SENTINEL LYMPH NODE BIOPSY Left 04/17/2018   Procedure: LEFT BREAST LUMPECTOMY WITH BRACKETED RADIOACTIVE SEEDS AND SENTINEL LYMPH NODE BIOPSY;   Surgeon: Enid Harry, MD;  Location: [facility];  Service: General;  Laterality: Left;   BREAST SURGERY Left    Lumpectomy   CARDIOVASCULAR STRESS TEST  07/19/2006   EF 70%, NO EVIDENCE OF ISCHEMIA   CHOLECYSTECTOMY  12/30/10   COLONOSCOPY     DILATION AND CURETTAGE OF UTERUS     HYSTEROSCOPY WITH D & C N/A 06/07/2018   Procedure: DILATATION AND CURETTAGE /HYSTEROSCOPY;  Surgeon: Thurman Flores, MD;  Location: [facility];  Service: Gynecology;  Laterality: N/A;   inguinal herniography     NASAL SINUS SURGERY     RADIOACTIVE SEED GUIDED EXCISIONAL BREAST BIOPSY Right 04/17/2018   Procedure: RIGHT BREAST SEED GUIDED EXCISIONAL BIOPSY;  Surgeon: Enid Harry, MD;  Location: [facility];  Service: General;  Laterality: Right;   RE-EXCISION OF BREAST LUMPECTOMY Left 05/15/2018   Procedure: RE-EXCISION OF LEFT BREAST LUMPECTOMY, ASPIRATION OF LEFT AXILLARY SEROMA;  Surgeon: Enid Harry, MD;  Location: [facility];  Service: General;  Laterality: Left;   T & A     TOOTH EXTRACTION     TOTAL KNEE ARTHROPLASTY Left 09/11/2017   Procedure: LEFT TOTAL KNEE ARTHROPLASTY;  Surgeon: Liliane Rei, MD;  Location: [facility];  Service: Orthopedics;  Laterality: Left;  with block   US  ECHOCARDIOGRAPHY  07/17/2006   EF 55-60%   Patient Active Problem List   Diagnosis Date Noted   Other specified disorders of eustachian tube, right ear 11/15/2023   Chronic rhinitis 11/15/2023   Sensorineural hearing loss, bilateral 11/15/2023   Spinal stenosis of lumbar region with radiculopathy 10/12/2021   Chronic postoperative pain 10/12/2021   Breast mass, right 03/27/2020   Hepatic steatosis 01/22/2020   Genetic testing 04/02/2018   Family history of breast cancer    Malignant neoplasm of upper-outer quadrant of left breast in female, estrogen receptor positive (HCC) 03/16/2018   OA (osteoarthritis) of knee 09/11/2017   Infrapatellar bursitis of right knee  04/29/2014   Pneumonia, aspiration (HCC) 12/28/2012   HYPOTHYROIDISM 11/15/2010   HYPERLIPIDEMIA 11/15/2010   DEPRESSION 11/15/2010   GERD 11/15/2010   CONSTIPATION 11/15/2010   GALLSTONES 11/15/2010   RENAL CYST 11/15/2010   INSOMNIA UNSPECIFIED 11/15/2010   COUGH 11/15/2010   ABDOMINAL PAIN OTHER SPECIFIED SITE 11/15/2010   TRANSAMINASES, SERUM, ELEVATED 11/15/2010    REFERRING PROVIDER: Enid Harry, MD  REFERRING DIAG: Left arm arm swelling  THERAPY DIAG:  Abnormal posture  Left shoulder pain, unspecified chronicity  Lymphedema, not elsewhere classified  ONSET DATE: 2-3 months ago  Rationale for Evaluation and Treatment: Rehabilitation  SUBJECTIVE:                                                                                                                                                                                           SUBJECTIVE STATEMENT:  My husband is really struggling with getting this sleeve on me in the morning because of his arthritis. We got the gloves but they aren't helping too much. I need to order one of the donning aids.    PERTINENT HISTORY:  04/17/2018 Bilateral lumpectomies with left SLNB with 0+/1 LN. Gr. 2 ILC Er/PR+, HER 2-, Ki 67 5%. Radiation 09/11/2018 -08/03/2018. Most recent prior PT treatment 04/05/2022 to 05/17/22 with this therapist. 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  PAIN:  Are you having pain? No  PRECAUTIONS: Left TKA, sciatica, Lympedema(left), small brain aneurysm  RED FLAGS: None   WEIGHT BEARING RESTRICTIONS: No  FALLS:  Has patient fallen in last 6 months? No  LIVING ENVIRONMENT: Lives with: lives with their spouse Lives in: House/apartment OCCUPATION: retired   LEISURE: reading , gardening  HAND DOMINANCE: right   PRIOR LEVEL OF FUNCTION: Independent  PATIENT GOALS: ease the pain, move better, check swelling   OBJECTIVE: Note: Objective measures were completed at Evaluation unless otherwise  noted.  COGNITION: Overall cognitive status: Within  functional limits for tasks assessed   PALPATION: Tender at proximal biceps, supraspinatus, mid deltoid,tender left pectorals, breast incision on left.  OBSERVATIONS / OTHER ASSESSMENTS: positive left impingement, positive empty  SENSATION: Light touch: Appears intact   POSTURE: forward head, rounded shoulders  UPPER EXTREMITY AROM/PROM:  A/PROM RIGHT   eval   Shoulder extension 65  Shoulder flexion 120  Shoulder abduction 164, slight scaption  Shoulder internal rotation 75  Shoulder external rotation 90    (Blank rows = not tested)  A/PROM LEFT   eval  Shoulder extension 54, tight  Shoulder flexion 120, tight , mild pain at top of shoulder  Shoulder abduction 90  Shoulder internal rotation   Shoulder external rotation     (Blank rows = not tested)  CERVICAL AROM:  within functional limits:     UPPER EXTREMITY STRENGTH: functional but limited by pain on left  LYMPHEDEMA ASSESSMENTS:   SURGERY TYPE/DATE: 04/17/2018 Bilateral lumpectomies with Left SLNB, had a seroma on left  NUMBER OF LYMPH NODES REMOVED: 0+/1  CHEMOTHERAPY: NO  RADIATION:Yes   HORMONE TREATMENT: end 07/2018  INFECTIONS: NO   LYMPHEDEMA ASSESSMENTS:   LANDMARK RIGHT  eval  At axilla  33.0  15 cm proximal to olecranon process 32.3  10 cm proximal to olecranon process 30.8  Olecranon process 25.7  15 cm proximal to ulnar styloid process 23.3  10 cm proximal to ulnar styloid process 20.1  Just proximal to ulnar styloid process 14.2  Across hand at thumb web space 18.0  At base of 2nd digit 6.2  (Blank rows = not tested)  LANDMARK LEFT  eval Left 03/15/24 LEFT 03/20/2024 LEFT 03/22/2024 Left 04/03/24  At axilla  33.7  32.3  31.7  15 cm proximal to olecranon process 33.5  33.3  30.7  10 cm proximal to olecranon process 32.3  31.9 30.7 29.6  Olecranon process 26.8  26.7 26.2 25.4  15 cm proximal to ulnar styloid process 23.3  23.5  23.5 23.5  10 cm proximal to ulnar styloid process 20.0  20.1 20.1 20.4  Just proximal to ulnar styloid process 15.25 15.2 15.2 15.0 14.8  Across hand at thumb web space 18.4  17.8  16.7  At base of 2nd digit 6.1  6.0 5.9 5.8  (Blank rows = not tested)   FUNCTIONAL TESTS:    GAIT: flexed posture,increased kyphosis, independent     QUICK DASH SURVEY: 40.91                                                                                                                            TREATMENT DATE:  04/12/24: Self Care Showed pt 2 donning butler options: slippie gator and Medi donning butler . Then got our Medi butler for pt to try using this. She preferred this option so assisted her with ordering this from [employer]. Therapeutic Activities Postural strength with supine scapular series with yellow therband as follows: Bil Horz abd, bil UE er,  and narrow and wide grips flex x 10 each returning therapist demo, then D2 x 5 each UE returning demo and tactile cues for correct technique Manual Therapy MLD to Lt UE: Short neck, superficial and deep abdominals, Lt inguinal nodes and Lt axillo-inguinal anastomosis, Rt axillary and pectoral nodes, anterior intact thorax sequence, and then anterior inter-axillary anastomosis, then Lt UE working from proximal to distal and retracing all steps back to anastomosis and ending with LN's.  P/ROM to Lt shoulder into flex, abd and D2 with scapular depression by therapist throughout; end motions are improved today STM to Lt axilla at pect insertion that conts to tight but softened some during MT, then to lateral trunk where still tight but much less so Donned pts compression sleeve after manual therapy  04/10/24: Self Care Assisted pt with reordering a medium size gauntlet as she returned the size small yesterday. This should arrive in 4-8 days. Also called [employer]uru for pt to verify that they are going to credit her card and not her [employer] account.   Manual Therapy MLD to Lt UE: Short neck, superficial and deep abdominals, Lt inguinal nodes and Lt axillo-inguinal anastomosis, Rt axillary and pectoral nodes, anterior intact thorax sequence, and then anterior inter-axillary anastomosis, then Lt UE working from proximal to distal and retracing all steps back to anastomosis and ending with LN's.  P/ROM to Lt shoulder into flex, abd and D2 with scapular depression by therapist throughout; end motions are improved today STM to Lt axilla at scar tissue and pect insertion that was palpably tight today, overall scar tissue is much softer now and pt with no tenderness Donned pts compression sleeve after manual therapy  04/08/2024 Pt brought in small exostrong gauntlet and medium sleeve. Gauntlet was much to small and pt will return for a medium. She willl call when she returns home today. Tried on pts sleeve and she was able to don with PT cueing and using gloves. Seems to be a good fit. Removed for treatment. STM to left UT/levator, pectorals and lateral trunk with cocoa butter PROM left shoulder flexion, scaption, abduction, ER In supine: Short neck, 5 diaphragmatic breaths, R axillary nodes and establishment of interaxillary pathway, L inguinal nodes and establishment of axilloinguinal pathway, then L UE working proximal to distal, moving fluid from upper inner arm outwards, and doing both sides of forearm moving fluid towards pathways spending extra time in any areas of fibrosis then retracing all steps . At completion of MLD instructed pt in use of EZ slide which worked well to get her sleeve on easily, but didn't hold sleeve enough when therapist removed EZ and it slid down. Used gloves to get sleeve up high enough. Pt is to try sleeve without gauntlet and may wear as long as her hand/fingers don't swell, and remove tonight before bed time. She may reapply tomorrow again watching to be sure her hand is not swelling. If it does swell she will discontinue  sleeve. She is to wear or bring sleeve/bandages next visit.       PATIENT EDUCATION:  Education details: Supine scapular series Person educated: Patient Education method: Explanation, demonstration, tactile and VC's and handout issued Education comprehension: verbalized understanding, returned demo, tactile cues and pt will benefit from further review  HOME EXERCISE PROGRAM: Supine scapular series with yellow theraband  ASSESSMENT:  CLINICAL IMPRESSION: Assisted pt with ordering a Medi donning butler. Then progressed HEP to include postural strength with theraband. She had some Lt shoulder pain with these  so advised her not to push into pain and limit end motion if she feels pain. She verbalized understanding. Then continued with MT to help with decreasing lymphedema and decrease Lt upper quadrant tightness that still limits her Lt shoulder motions.   OBJECTIVE IMPAIRMENTS: decreased activity tolerance, decreased knowledge of condition, decreased ROM, decreased strength, increased edema, impaired UE functional use, postural dysfunction, and pain.   ACTIVITY LIMITATIONS: lifting, sleeping, dressing, and reach over head  PARTICIPATION LIMITATIONS: cleaning and laundry  PERSONAL FACTORS:  Left breast Cancer, radiation are also patient's functional outcome.   REHAB POTENTIAL: Good  CLINICAL DECISION MAKING: Evolving/moderate complexity  EVALUATION COMPLEXITY: Moderate  GOALS: Goals reviewed with patient? Yes  SHORT TERM GOALS: Target date: 04/01/2024  Pt will be independent and compliant with HEP for left shoulder ROM/strength  Baseline: Goal status: INITIAL  2.  Pt will be fit for appropriate flat knit compression sleeve to address left UE lymphedema Baseline:  Goal status: INITIAL  3.  Pt will have decreased left shoulder/axillary pain by 25% or more Baseline:  Goal status: INITIAL  4.  Quick dash will improve to greater than 30% to demonstrate improved  function Baseline:  Goal status: INITIAL   LONG TERM GOALS: Target date: 04/22/2024  Pt will have decreased complaints left shoulder/axillary pain  by 50%  Baseline:  Goal status: INITIAL  2.  Pt will have left shoulder ROM WNL as compared to right UE   Baseline:  Goal status: INITIAL  3.  Pts quick dash will be no greater than 22% to demonstrate improved function in left shoulder Baseline:  Goal status: INITIAL  4.  Pt will be educated in self MLD to decrease left UE swelling prn Baseline:  Goal status: INITIAL   PLAN:  PT FREQUENCY: 2-3x/week  PT DURATION: 6 weeks  PLANNED INTERVENTIONS: 97164- PT Re-evaluation, 97110-Therapeutic exercises, 97530- Therapeutic activity, 97112- Neuromuscular re-education, 97535- Self Care, 08657- Manual therapy, [phone]- Orthotic Fit/training, Patient/Family education, and Joint mobilization  PLAN FOR NEXT SESSION: check gauntlet when it arrives, if good fit assist pt with ordering more sleeves and gauntlet (maybe one glove?), Cont STM left UT, axillary border of pectorals, try supine scapular series or gentle isometrics if not tolerated, MLD to left UE and instruct pt  04/03/24 - Faxed demographics and PT note to [employer] and [facility] custom fit. Also faxed script to Dr .Delane Fear for the nighttime garment. VR  Denyce Flank, PTA 04/12/2024, 12:08 PM  Over Head Pull: Narrow and Wide Grip   Cancer Rehab [phone]   On back, knees bent, feet flat, band across thighs, elbows straight but relaxed. Pull hands apart (start). Keeping elbows straight, bring arms up and over head, hands toward floor. Keep pull steady on band. Hold momentarily. Return slowly, keeping pull steady, back to start. Then do same with a wider grip on the band (past shoulder width) Repeat _5-10__ times. Band color __yellow____   Side Pull: Double Arm   On back, knees bent, feet flat. Arms perpendicular to body, shoulder level, elbows straight but relaxed. Pull  arms out to sides, elbows straight. Resistance band comes across collarbones, hands toward floor. Hold momentarily. Slowly return to starting position. Repeat _5-10__ times. Band color _yellow____   Sword   On back, knees bent, feet flat, left hand on left hip, right hand above left. Pull right arm DIAGONALLY (hip to shoulder) across chest. Bring right arm along head toward floor. Hold momentarily. Slowly return to starting position. Repeat _5-10__ times. Do  with left arm. Band color _yellow_____   Shoulder Rotation: Double Arm   On back, knees bent, feet flat, elbows tucked at sides, bent 90, hands palms up. Pull hands apart and down toward floor, keeping elbows near sides. Hold momentarily. Slowly return to starting position. Repeat _5-10__ times. Band color __yellow____

## 2024-04-12 NOTE — Patient Instructions (Addendum)
 [name]

## 2024-04-15 ENCOUNTER — Ambulatory Visit

## 2024-04-15 DIAGNOSIS — M25512 Pain in left shoulder: Secondary | ICD-10-CM | POA: Diagnosis not present

## 2024-04-15 DIAGNOSIS — R293 Abnormal posture: Secondary | ICD-10-CM | POA: Diagnosis not present

## 2024-04-15 DIAGNOSIS — R600 Localized edema: Secondary | ICD-10-CM | POA: Diagnosis not present

## 2024-04-15 DIAGNOSIS — I890 Lymphedema, not elsewhere classified: Secondary | ICD-10-CM | POA: Diagnosis not present

## 2024-04-15 DIAGNOSIS — M25612 Stiffness of left shoulder, not elsewhere classified: Secondary | ICD-10-CM | POA: Diagnosis not present

## 2024-04-15 NOTE — Therapy (Signed)
 OUTPATIENT PHYSICAL THERAPY  UPPER EXTREMITY ONCOLOGY TREATMENT  Patient Name: [name] MRN: [id number] DOB:03/12/1946, 78 y.o., female Today's Date: 04/15/2024  END OF SESSION:  PT End of Session - 04/15/24 1454     Visit Number 14    Number of Visits 18    Date for PT Re-Evaluation 04/22/24    Authorization Type Medicare    PT Start Time 1500    PT Stop Time 1605    PT Time Calculation (min) 65 min    Activity Tolerance Patient tolerated treatment well    Behavior During Therapy WFL for tasks assessed/performed             Past Medical History:  Diagnosis Date   Arthritis    hands and knees   Complication of anesthesia    aspiration pna; following a colonoscopy, pt requests head of bed elevated if possible.   Constipation    Cough    Depression    Dysrhythmia    RBBB on 06-06-17 ekg    Elevated liver function tests    Esophageal stricture    Family history of breast cancer    Gallstones    Genetic testing 04/02/2018   STAT Breast panel with reflex to Multi-Cancer panel (83 genes) @ [facility] - No pathogenic mutations detected   GERD (gastroesophageal reflux disease)    Hiatal hernia    History of kidney stones    History of radiation therapy 07/09/2018- 08/03/18   Left Breast/ 40.05 Gy in 15 fractions. Left Breast boost 10 Gy in 5 fractions.    Hyperlipemia    Hypothyroidism    Insomnia    Malignant neoplasm of upper-outer quadrant of left female breast (HCC)    Nephrolithiasis    Personal history of radiation therapy    Pneumonia 2015   aspirated after colonosopy   Renal cyst    Past Surgical History:  Procedure Laterality Date   BREAST BIOPSY Left 05/31/2019   mri   BREAST BIOPSY Left 04/2019   malignant   BREAST LUMPECTOMY Left 04/17/2018   malignant   BREAST LUMPECTOMY WITH RADIOACTIVE SEED AND SENTINEL LYMPH NODE BIOPSY Left 04/17/2018   Procedure: LEFT BREAST LUMPECTOMY WITH BRACKETED RADIOACTIVE SEEDS AND SENTINEL LYMPH NODE BIOPSY;   Surgeon: Enid Harry, MD;  Location: [facility];  Service: General;  Laterality: Left;   BREAST SURGERY Left    Lumpectomy   CARDIOVASCULAR STRESS TEST  07/19/2006   EF 70%, NO EVIDENCE OF ISCHEMIA   CHOLECYSTECTOMY  12/30/10   COLONOSCOPY     DILATION AND CURETTAGE OF UTERUS     HYSTEROSCOPY WITH D & C N/A 06/07/2018   Procedure: DILATATION AND CURETTAGE /HYSTEROSCOPY;  Surgeon: Thurman Flores, MD;  Location: [facility];  Service: Gynecology;  Laterality: N/A;   inguinal herniography     NASAL SINUS SURGERY     RADIOACTIVE SEED GUIDED EXCISIONAL BREAST BIOPSY Right 04/17/2018   Procedure: RIGHT BREAST SEED GUIDED EXCISIONAL BIOPSY;  Surgeon: Enid Harry, MD;  Location: [facility];  Service: General;  Laterality: Right;   RE-EXCISION OF BREAST LUMPECTOMY Left 05/15/2018   Procedure: RE-EXCISION OF LEFT BREAST LUMPECTOMY, ASPIRATION OF LEFT AXILLARY SEROMA;  Surgeon: Enid Harry, MD;  Location: [facility];  Service: General;  Laterality: Left;   T & A     TOOTH EXTRACTION     TOTAL KNEE ARTHROPLASTY Left 09/11/2017   Procedure: LEFT TOTAL KNEE ARTHROPLASTY;  Surgeon: Liliane Rei, MD;  Location: [facility];  Service: Orthopedics;  Laterality: Left;  with block   US  ECHOCARDIOGRAPHY  07/17/2006   EF 55-60%   Patient Active Problem List   Diagnosis Date Noted   Other specified disorders of eustachian tube, right ear 11/15/2023   Chronic rhinitis 11/15/2023   Sensorineural hearing loss, bilateral 11/15/2023   Spinal stenosis of lumbar region with radiculopathy 10/12/2021   Chronic postoperative pain 10/12/2021   Breast mass, right 03/27/2020   Hepatic steatosis 01/22/2020   Genetic testing 04/02/2018   Family history of breast cancer    Malignant neoplasm of upper-outer quadrant of left breast in female, estrogen receptor positive (HCC) 03/16/2018   OA (osteoarthritis) of knee 09/11/2017   Infrapatellar bursitis of right knee  04/29/2014   Pneumonia, aspiration (HCC) 12/28/2012   HYPOTHYROIDISM 11/15/2010   HYPERLIPIDEMIA 11/15/2010   DEPRESSION 11/15/2010   GERD 11/15/2010   CONSTIPATION 11/15/2010   GALLSTONES 11/15/2010   RENAL CYST 11/15/2010   INSOMNIA UNSPECIFIED 11/15/2010   COUGH 11/15/2010   ABDOMINAL PAIN OTHER SPECIFIED SITE 11/15/2010   TRANSAMINASES, SERUM, ELEVATED 11/15/2010    REFERRING PROVIDER: Enid Harry, MD  REFERRING DIAG: Left arm arm swelling  THERAPY DIAG:  Abnormal posture  Left shoulder pain, unspecified chronicity  Lymphedema, not elsewhere classified  ONSET DATE: 2-3 months ago  Rationale for Evaluation and Treatment: Rehabilitation  SUBJECTIVE:                                                                                                                                                                                           SUBJECTIVE STATEMENT:    The sleeve is good. I would like 2 more compression sleeves, another gauntlet, and a glove. I still haven't heard anything about the night sleeve and I have not received the new gauntlet yet. I haven't heard anything from [employer] yet.  PERTINENT HISTORY:  04/17/2018 Bilateral lumpectomies with left SLNB with 0+/1 LN. Gr. 2 ILC Er/PR+, HER 2-, Ki 67 5%. Radiation 09/11/2018 -08/03/2018. Most recent prior PT treatment 04/05/2022 to 05/17/22 with this therapist. 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  PAIN:  Are you having pain? No  PRECAUTIONS: Left TKA, sciatica, Lympedema(left), small brain aneurysm  RED FLAGS: None   WEIGHT BEARING RESTRICTIONS: No  FALLS:  Has patient fallen in last 6 months? No  LIVING ENVIRONMENT: Lives with: lives with their spouse Lives in: House/apartment OCCUPATION: retired   LEISURE: reading , gardening  HAND DOMINANCE: right   PRIOR LEVEL OF FUNCTION: Independent  PATIENT GOALS: ease the pain, move better, check swelling   OBJECTIVE: Note: Objective measures were  completed at Evaluation unless otherwise noted.  COGNITION: Overall cognitive status: Within functional limits for tasks assessed   PALPATION: Tender at proximal biceps, supraspinatus, mid deltoid,tender left pectorals, breast incision on left.  OBSERVATIONS / OTHER ASSESSMENTS: positive left impingement, positive empty  SENSATION: Light touch: Appears intact   POSTURE: forward head, rounded shoulders  UPPER EXTREMITY AROM/PROM:  A/PROM RIGHT   eval   Shoulder extension 65  Shoulder flexion 120  Shoulder abduction 164, slight scaption  Shoulder internal rotation 75  Shoulder external rotation 90    (Blank rows = not tested)  A/PROM LEFT   eval  Shoulder extension 54, tight  Shoulder flexion 120, tight , mild pain at top of shoulder  Shoulder abduction 90  Shoulder internal rotation   Shoulder external rotation     (Blank rows = not tested)  CERVICAL AROM:  within functional limits:     UPPER EXTREMITY STRENGTH: functional but limited by pain on left  LYMPHEDEMA ASSESSMENTS:   SURGERY TYPE/DATE: 04/17/2018 Bilateral lumpectomies with Left SLNB, had a seroma on left  NUMBER OF LYMPH NODES REMOVED: 0+/1  CHEMOTHERAPY: NO  RADIATION:Yes   HORMONE TREATMENT: end 07/2018  INFECTIONS: NO   LYMPHEDEMA ASSESSMENTS:   LANDMARK RIGHT  eval  At axilla  33.0  15 cm proximal to olecranon process 32.3  10 cm proximal to olecranon process 30.8  Olecranon process 25.7  15 cm proximal to ulnar styloid process 23.3  10 cm proximal to ulnar styloid process 20.1  Just proximal to ulnar styloid process 14.2  Across hand at thumb web space 18.0  At base of 2nd digit 6.2  (Blank rows = not tested)  LANDMARK LEFT  eval Left 03/15/24 LEFT 03/20/2024 LEFT 03/22/2024 Left 04/03/24  At axilla  33.7  32.3  31.7  15 cm proximal to olecranon process 33.5  33.3  30.7  10 cm proximal to olecranon process 32.3  31.9 30.7 29.6  Olecranon process 26.8  26.7 26.2 25.4  15 cm  proximal to ulnar styloid process 23.3  23.5 23.5 23.5  10 cm proximal to ulnar styloid process 20.0  20.1 20.1 20.4  Just proximal to ulnar styloid process 15.25 15.2 15.2 15.0 14.8  Across hand at thumb web space 18.4  17.8  16.7  At base of 2nd digit 6.1  6.0 5.9 5.8  (Blank rows = not tested)   FUNCTIONAL TESTS:    GAIT: flexed posture,increased kyphosis, independent     QUICK DASH SURVEY: 40.91                                                                                                                            TREATMENT DATE:   04/15/2024 Educated pt in standing theraband exercises including scapular retraction, shoulder extension and bilateral ER using good posture x 10 and updated HEP. Also performed supine horizontal abduction with yellow x 10. Discussed garments and pt would like 2 more sleeves, another gauntlet and a glove. Faxed script to Dr. Delane Fear [facility]  to Lt axilla at pect insertionT, then to lateral trunk where still tight with cocoa butter PROM to left shoulder flexion, scaption, abduction and ER with VC's prn to relax In supine: Short neck, superficial and deep abdominals, R axillary nodes and establishment of interaxillary pathway, L inguinal nodes and establishment of axilloinguinal pathway, then L UE working proximal to distal, moving fluid from upper inner arm outwards, and doing both sides of forearm, and hand moving fluid towards pathways spending extra time in any areas of fibrosis then retracing all steps  Had pt use arm butler for donning her sleeve and used gloves to assist  Did very well overall.. Discussed teaching pt MLD in sitting next visit. 04/12/24: Self Care Showed pt 2 donning butler options: slippie gator and Medi donning butler . Then got our Medi butler for pt to try using this. She preferred this option so assisted her with ordering this from [employer]. Therapeutic Activities Postural strength with supine scapular series with yellow therband as  follows: Bil Horz abd, bil UE er, and narrow and wide grips flex x 10 each returning therapist demo, then D2 x 5 each UE returning demo and tactile cues for correct technique Manual Therapy MLD to Lt UE: Short neck, superficial and deep abdominals, Lt inguinal nodes and Lt axillo-inguinal anastomosis, Rt axillary and pectoral nodes, anterior intact thorax sequence, and then anterior inter-axillary anastomosis, then Lt UE working from proximal to distal and retracing all steps back to anastomosis and ending with LN's.  P/ROM to Lt shoulder into flex, abd and D2 with scapular depression by therapist throughout; end motions are improved today STM to Lt axilla at pect insertion that conts to tight but softened some during MT, then to lateral trunk where still tight but much less so Donned pts compression sleeve after manual therapy  04/10/24: Self Care Assisted pt with reordering a medium size gauntlet as she returned the size small yesterday. This should arrive in 4-8 days. Also called [employer]uru for pt to verify that they are going to credit her card and not her [employer] account.  Manual Therapy MLD to Lt UE: Short neck, superficial and deep abdominals, Lt inguinal nodes and Lt axillo-inguinal anastomosis, Rt axillary and pectoral nodes, anterior intact thorax sequence, and then anterior inter-axillary anastomosis, then Lt UE working from proximal to distal and retracing all steps back to anastomosis and ending with LN's.  P/ROM to Lt shoulder into flex, abd and D2 with scapular depression by therapist throughout; end motions are improved today STM to Lt axilla at scar tissue and pect insertion that was palpably tight today, overall scar tissue is much softer now and pt with no tenderness Donned pts compression sleeve after manual therapy  04/08/2024 Pt brought in small exostrong gauntlet and medium sleeve. Gauntlet was much to small and pt will return for a medium. She willl call when she returns  home today. Tried on pts sleeve and she was able to don with PT cueing and using gloves. Seems to be a good fit. Removed for treatment. STM to left UT/levator, pectorals and lateral trunk with cocoa butter PROM left shoulder flexion, scaption, abduction, ER In supine: Short neck, 5 diaphragmatic breaths, R axillary nodes and establishment of interaxillary pathway, L inguinal nodes and establishment of axilloinguinal pathway, then L UE working proximal to distal, moving fluid from upper inner arm outwards, and doing both sides of forearm moving fluid towards pathways spending extra time in any areas of fibrosis then retracing all steps .  At completion of MLD instructed pt in use of EZ slide which worked well to get her sleeve on easily, but didn't hold sleeve enough when therapist removed EZ and it slid down. Used gloves to get sleeve up high enough. Pt is to try sleeve without gauntlet and may wear as long as her hand/fingers don't swell, and remove tonight before bed time. She may reapply tomorrow again watching to be sure her hand is not swelling. If it does swell she will discontinue sleeve. She is to wear or bring sleeve/bandages next visit.       PATIENT EDUCATION:  04/15/2024 Standing Scapular retraction, bilateral shoulder ext and bilateral ER with yellow x 10. Emphasis on proper posture Education details: Supine scapular series Person educated: Patient Education method: Explanation, demonstration, tactile and VC's and handout issued Education comprehension: verbalized understanding, returned demo, tactile cues and pt will benefit from further review  HOME EXERCISE PROGRAM: Supine scapular series with yellow theraband, standing postureal exercises;SR, shoulder ext, bilateral eR  ASSESSMENT:  CLINICAL IMPRESSION: Pt was able to do standing theraband and supine horizontal abduction without pain today. She was advised to discontinue painful exercises and focus on these first. She has not  yet received her gauntlet or Randal Bury yet, but is interested in ordering more garments through insurance. Sent Script to Dr. Delane Fear today. Need to practice pt self MLD in sitting next visit so pt can watch and do easier.Pt did well using Butler while seated.  OBJECTIVE IMPAIRMENTS: decreased activity tolerance, decreased knowledge of condition, decreased ROM, decreased strength, increased edema, impaired UE functional use, postural dysfunction, and pain.   ACTIVITY LIMITATIONS: lifting, sleeping, dressing, and reach over head  PARTICIPATION LIMITATIONS: cleaning and laundry  PERSONAL FACTORS:  Left breast Cancer, radiation are also patient's functional outcome.   REHAB POTENTIAL: Good  CLINICAL DECISION MAKING: Evolving/moderate complexity  EVALUATION COMPLEXITY: Moderate  GOALS: Goals reviewed with patient? Yes  SHORT TERM GOALS: Target date: 04/01/2024  Pt will be independent and compliant with HEP for left shoulder ROM/strength  Baseline: Goal status: MET  2.  Pt will be fit for appropriate flat knit compression sleeve to address left UE lymphedema Baseline:  Goal status: MET 04/08/2024 3.  Pt will have decreased left shoulder/axillary pain by 25% or more Baseline:  Goal status: INITIAL  4.  Quick dash will improve to greater than 30% to demonstrate improved function Baseline:  Goal status: INITIAL   LONG TERM GOALS: Target date: 04/22/2024  Pt will have decreased complaints left shoulder/axillary pain  by 50%  Baseline:  Goal status: INITIAL  2.  Pt will have left shoulder ROM WNL as compared to right UE   Baseline:  Goal status: INITIAL  3.  Pts quick dash will be no greater than 22% to demonstrate improved function in left shoulder Baseline:  Goal status: INITIAL  4.  Pt will be educated in self MLD to decrease left UE swelling prn Baseline:  Goal status: INITIAL   PLAN:  PT FREQUENCY: 2-3x/week  PT DURATION: 6 weeks  PLANNED INTERVENTIONS: 97164- PT  Re-evaluation, 97110-Therapeutic exercises, 97530- Therapeutic activity, 97112- Neuromuscular re-education, 97535- Self Care, 16109- Manual therapy, [phone]- Orthotic Fit/training, Patient/Family education, and Joint mobilization  PLAN FOR NEXT SESSION: check goals, check gauntlet when it arrives, if good fit assist pt with ordering more sleeves and gauntlet (maybe one glove?), instruct Self MLD in sitting next,Cont STM left UT, axillary border of pectorals, try supine scapular series or gentle isometrics if not tolerated, MLD to  left UE and instruct pt  04/03/24 - Faxed demographics and PT note to [employer] and [facility]Z custom fit. Also faxed script to Dr .Delane Fear for the nighttime garment. VR 04/15/2024; messaged Leah re; flexi, Faxed script to Dr. Delane Fear for garments to order through insurance Latisha Poland, PT 04/15/2024, 4:19 PM  Over Head Pull: Narrow and Wide Grip   [facility] [phone]   On back, knees bent, feet flat, band across thighs, elbows straight but relaxed. Pull hands apart (start). Keeping elbows straight, bring arms up and over head, hands toward floor. Keep pull steady on band. Hold momentarily. Return slowly, keeping pull steady, back to start. Then do same with a wider grip on the band (past shoulder width) Repeat _5-10__ times. Band color __yellow____   Side Pull: Double Arm   On back, knees bent, feet flat. Arms perpendicular to body, shoulder level, elbows straight but relaxed. Pull arms out to sides, elbows straight. Resistance band comes across collarbones, hands toward floor. Hold momentarily. Slowly return to starting position. Repeat _5-10__ times. Band color _yellow____   Sword   On back, knees bent, feet flat, left hand on left hip, right hand above left. Pull right arm DIAGONALLY (hip to shoulder) across chest. Bring right arm along head toward floor. Hold momentarily. Slowly return to starting position. Repeat _5-10__ times. Do with left arm. Band color  _yellow_____   Shoulder Rotation: Double Arm   On back, knees bent, feet flat, elbows tucked at sides, bent 90, hands palms up. Pull hands apart and down toward floor, keeping elbows near sides. Hold momentarily. Slowly return to starting position. Repeat _5-10__ times. Band color __yellow____

## 2024-04-16 DIAGNOSIS — R52 Pain, unspecified: Secondary | ICD-10-CM | POA: Diagnosis not present

## 2024-04-16 DIAGNOSIS — M5386 Other specified dorsopathies, lumbar region: Secondary | ICD-10-CM | POA: Diagnosis not present

## 2024-04-16 DIAGNOSIS — Z96652 Presence of left artificial knee joint: Secondary | ICD-10-CM | POA: Diagnosis not present

## 2024-04-16 DIAGNOSIS — Z981 Arthrodesis status: Secondary | ICD-10-CM | POA: Diagnosis not present

## 2024-04-16 DIAGNOSIS — M5417 Radiculopathy, lumbosacral region: Secondary | ICD-10-CM | POA: Diagnosis not present

## 2024-04-16 DIAGNOSIS — M1712 Unilateral primary osteoarthritis, left knee: Secondary | ICD-10-CM | POA: Diagnosis not present

## 2024-04-16 DIAGNOSIS — G894 Chronic pain syndrome: Secondary | ICD-10-CM | POA: Diagnosis not present

## 2024-04-17 NOTE — Progress Notes (Signed)
 Cardiology Clinic Note   Patient Name: [name] Date of Encounter: 04/19/2024  Primary Care Provider:  Margarete Sharps, MD Primary Cardiologist:  Alexandria Angel, MD  Patient Profile    [name] 78 year old female presents to the clinic today for follow-up evaluation of her hyperlipidemia and dizziness.  Past Medical History    Past Medical History:  Diagnosis Date   Arthritis    hands and knees   Complication of anesthesia    aspiration pna; following a colonoscopy, pt requests head of bed elevated if possible.   Constipation    Cough    Depression    Dysrhythmia    RBBB on 06-06-17 ekg    Elevated liver function tests    Esophageal stricture    Family history of breast cancer    Gallstones    Genetic testing 04/02/2018   STAT Breast panel with reflex to Multi-Cancer panel (83 genes) @ [facility] - No pathogenic mutations detected   GERD (gastroesophageal reflux disease)    Hiatal hernia    History of kidney stones    History of radiation therapy 07/09/2018- 08/03/18   Left Breast/ 40.05 Gy in 15 fractions. Left Breast boost 10 Gy in 5 fractions.    Hyperlipemia    Hypothyroidism    Insomnia    Malignant neoplasm of upper-outer quadrant of left female breast (HCC)    Nephrolithiasis    Personal history of radiation therapy    Pneumonia 2015   aspirated after colonosopy   Renal cyst    Past Surgical History:  Procedure Laterality Date   BREAST BIOPSY Left 05/31/2019   mri   BREAST BIOPSY Left 04/2019   malignant   BREAST LUMPECTOMY Left 04/17/2018   malignant   BREAST LUMPECTOMY WITH RADIOACTIVE SEED AND SENTINEL LYMPH NODE BIOPSY Left 04/17/2018   Procedure: LEFT BREAST LUMPECTOMY WITH BRACKETED RADIOACTIVE SEEDS AND SENTINEL LYMPH NODE BIOPSY;  Surgeon: Enid Harry, MD;  Location: [facility];  Service: General;  Laterality: Left;   BREAST SURGERY Left    Lumpectomy   CARDIOVASCULAR STRESS TEST  07/19/2006   EF 70%, NO EVIDENCE OF  ISCHEMIA   CHOLECYSTECTOMY  12/30/10   COLONOSCOPY     DILATION AND CURETTAGE OF UTERUS     HYSTEROSCOPY WITH D & C N/A 06/07/2018   Procedure: DILATATION AND CURETTAGE /HYSTEROSCOPY;  Surgeon: Thurman Flores, MD;  Location: [facility];  Service: Gynecology;  Laterality: N/A;   inguinal herniography     NASAL SINUS SURGERY     RADIOACTIVE SEED GUIDED EXCISIONAL BREAST BIOPSY Right 04/17/2018   Procedure: RIGHT BREAST SEED GUIDED EXCISIONAL BIOPSY;  Surgeon: Enid Harry, MD;  Location: [facility];  Service: General;  Laterality: Right;   RE-EXCISION OF BREAST LUMPECTOMY Left 05/15/2018   Procedure: RE-EXCISION OF LEFT BREAST LUMPECTOMY, ASPIRATION OF LEFT AXILLARY SEROMA;  Surgeon: Enid Harry, MD;  Location: [facility];  Service: General;  Laterality: Left;   T & A     TOOTH EXTRACTION     TOTAL KNEE ARTHROPLASTY Left 09/11/2017   Procedure: LEFT TOTAL KNEE ARTHROPLASTY;  Surgeon: Liliane Rei, MD;  Location: [facility];  Service: Orthopedics;  Laterality: Left;  with block   US  ECHOCARDIOGRAPHY  07/17/2006   EF 55-60%    Allergies  Allergies  Allergen Reactions   Oxycontin  [Oxycodone  Hcl]     "it made me feel very high and hyper"    Codeine Nausea And Vomiting    History of  Present Illness    [name] has a PMH of chest pain, nonspecific EKG changes, dizziness, breast CA, and hyperlipidemia.  Her echocardiogram 12/17 showed normal LV function and G1 DD.  She was noted to have orthostatic symptoms during previous cardiology visits.  Her nuclear stress test 6/18 showed ejection fraction of 64% and no ischemia.  Her carotid Doppler 7/18 showed 1-39% left carotid stenosis.  She was seen by Dr. Audery Blazing 08/04/2020.  During that time she denied dyspnea, chest pain, palpitations, and syncope.  She did note occasional dizziness with standing.   She presented to the clinic 04/04/22 for follow-up evaluation and stated she was limited in her mobility and  has not been as physically active due to sciatica in her right leg.  She continued to have cancer treatment.  She had recently started taking her anastrozole  3 days/week instead of 7.  She felt that her memory had improved.  She denied symptoms of heart failure.  We reviewed her previous stress testing.  Both she and her husband expressed understanding.  She did have an episode of global amnesia in November last year.  The episode was at [facility].  We reviewed her CT report.  I asked her to increase her physical activity as tolerated, continue her current medication regimen, and continue heart healthy low-sodium diet.  Follow-up for 12 months was planned.  She presented with clinic  03/15/23 for follow-up evaluation and stated she had a back injection for sciatica approximately 1 month prior.  Since that time she had noticed elevated heart rate.  She reported that when she was younger, in her 82s, she was on medication for tachycardia.   EKG  showed normal sinus rhythm 94 bpm.  We reviewed options for treatment.  I  asked her to maintain her p.o. hydration, avoid caffeine, avoid too much chocolate and other triggers for palpitations.  She reported that she had stopped anastrozole  and  had some improvement in her memory.  I  prescribed propranolol  10 mg twice daily x 1 month and planned follow-up in 1 month.  She presented to the clinic 06/14/23 for follow-up evaluation and stated she continued to have pain related to her sciatica.  She noticed this on her left side.  Her blood pressure was slightly elevated today at 142/80.  She reported she was getting ready to see a neurosurgeon to talk about options for pain control.  She had been drinking about 36 to 46 ounces daily.  She reported that she felt better with her propranolol .  I refilled this for 90 days.  She did take Advil daily for her sciatica pain.  I planned follow-up in 9 months.  She presents to the clinic today for follow-up evaluation and states she had  COVID last fall.  She did not note increased work of breathing.  Her most recent issue is related to lymphedema.  She presents with a left arm sleeve.  She has only had to use occasional doses of propranolol  for elevated heart rate.  Her blood pressure is well-controlled today at 118/76.  Her EKG shows normal sinus rhythm left anterior fascicular block 85 bpm.  She is limited in her physical activity due to knee pain.  She continues to notice episodes of sciatica.  She has been using stationary bike a few times per week and plans to do water  walking in the pool.  I will continue her current medication regimen and plan follow-up in 9 to 12 months.   Today she  denies chest pain, increased shortness of breath, lower extremity edema, fatigue, palpitations, melena, hematuria, hemoptysis, diaphoresis, weakness, presyncope, syncope, orthopnea, and PND.    Home Medications    Prior to Admission medications   Medication Sig Start Date End Date Taking? Authorizing Provider  anastrozole  (ARIMIDEX ) 1 MG tablet Take 1 mg by mouth daily. She takes it 3 days a week    [provider]  aspirin  81 MG EC tablet Take by mouth.    [provider]  azelastine  (ASTELIN ) 0.1 % nasal spray Place 1 spray into both nostrils 2 (two) times daily. Use in each nostril as directed    [provider]  buPROPion  (WELLBUTRIN  XL) 150 MG 24 hr tablet Take by mouth.    [provider]  Cholecalciferol (VITAMIN D3) 1000 units CAPS Take 1 capsule by mouth 2 (two) times daily.     [provider]  cycloSPORINE  (RESTASIS ) 0.05 % ophthalmic emulsion Apply to eye.    [provider]  cycloSPORINE , PF, (CEQUA ) 0.09 % SOLN  08/26/20   [provider]  diazepam  (VALIUM ) 5 MG tablet Take one tablet po one hour prior to procedure. Must have a driver. 12/15/21   Kirsteins, Cecilia Coe, MD  diclofenac Sodium (VOLTAREN) 1 % GEL diclofenac 1 % topical gel  apply 2 grams to affected area two  to three times a day    [provider]  escitalopram  (LEXAPRO ) 10 MG tablet Take by mouth.    [provider]  esomeprazole  (NEXIUM ) 40 MG capsule Take 40 mg by mouth 2 (two) times daily before a meal.    [provider]  ezetimibe  (ZETIA ) 10 MG tablet Take 10 mg by mouth at bedtime.    [provider]  fluticasone  (FLONASE ) 50 MCG/ACT nasal spray Place 1 spray into both nostrils daily.    [provider]  Fluticasone  Propionate, Inhal, 50 MCG/ACT AEPB Inhale into the lungs.    [provider]  levothyroxine  (SYNTHROID , LEVOTHROID) 50 MCG tablet Take 50 mcg by mouth at bedtime.    [provider]  linaclotide Glory Larsen) 290 MCG CAPS capsule  11/24/20   [provider]  Magnesium  250 MG TABS     [provider]  naproxen sodium (ALEVE) 220 MG tablet Take 220 mg by mouth 2 (two) times daily as needed (pain).    [provider]  ondansetron  (ZOFRAN -ODT) 8 MG disintegrating tablet  01/25/21   [provider]  OVER THE COUNTER MEDICATION Apply 1 application topically daily as needed (pain). Natural pain relief ointment    [provider]  predniSONE (STERAPRED UNI-PAK 21 TAB) 10 MG (21) TBPK tablet  07/21/20   [provider]  Probiotic Product (PROBIOTIC ADVANCED PO) Take 1 tablet by mouth daily.    [provider]  rosuvastatin  (CRESTOR ) 5 MG tablet Take 5 mg by mouth at bedtime.     [provider]  sodium chloride  (OCEAN) 0.65 % SOLN nasal spray Place 4 sprays into both nostrils 4 (four) times daily.    [provider]  SUPER B COMPLEX/C PO Take 1 tablet by mouth daily.    [provider]  traMADol  (ULTRAM ) 50 MG tablet  01/25/21   [provider]  tretinoin (RETIN-A) 0.1 % cream  03/02/21   [provider]  UNABLE TO FIND Med Name: CBD Cream    [provider]  zolpidem  (AMBIEN ) 10 MG tablet zolpidem  10 mg tablet  take 1  tablet by  mouth at bedtime if needed    [provider]    Family History    Family History  Problem Relation Age of Onset   Breast cancer Mother 11       deceased 47; TAH/BSO in 37s/50s   Dementia Father        deceased 57   Breast cancer Sister    Breast cancer Maternal Aunt 36       deceased 87s   Colon cancer Neg Hx    Stomach cancer Neg Hx    Esophageal cancer Neg Hx    Liver cancer Neg Hx    She indicated that her mother is deceased. She indicated that her father is deceased. She indicated that her sister is alive. She indicated that her brother is alive. She indicated that her maternal grandmother is deceased. She indicated that her maternal grandfather is deceased. She indicated that her paternal grandmother is deceased. She indicated that her paternal grandfather is deceased. She indicated that her maternal aunt is deceased. She indicated that the status of her neg hx is unknown.  Social History    Social History   Socioeconomic History   Marital status: Married    Spouse name: Not on file   Number of children: 2   Years of education: Not on file   Highest education level: Not on file  Occupational History   Occupation: Retired    Associate Professor: [employer]  Tobacco Use   Smoking status: Never   Smokeless tobacco: Never  Vaping Use   Vaping status: Never Used  Substance and Sexual Activity   Alcohol  use: No   Drug use: No   Sexual activity: Not on file  Other Topics Concern   Not on file  Social History Narrative   Not on file   Social Drivers of Health   Financial Resource Strain: Low Risk  (04/16/2024)   Received from [facility]   Overall Financial Resource Strain (CARDIA)    Difficulty of Paying Living Expenses: Not hard at all  Food Insecurity: No Food Insecurity (04/16/2024)   Received from [facility]   Hunger Vital Sign    Worried About Running Out of Food in the Last Year: Never true    Ran Out of Food in the Last Year: Never true   Transportation Needs: No Transportation Needs (04/16/2024)   Received from [facility] - Transportation    Lack of Transportation (Medical): No    Lack of Transportation (Non-Medical): No  Physical Activity: Insufficiently Active (04/16/2024)   Received from [facility]   Exercise Vital Sign    Days of Exercise per Week: 1 day    Minutes of Exercise per Session: 20 min  Stress: No Stress Concern Present (04/16/2024)   Received from [facility] - Occupational Stress Questionnaire    Feeling of Stress : Not at all  Social Connections: Socially Integrated (04/16/2024)   Received from [facility]   Social Network    How would you rate your social network (family, work, friends)?: Good participation with social networks  Intimate Partner Violence: Not At Risk (04/16/2024)   Received from [facility]   HITS    Over the last 12 months how often did your partner physically hurt you?: Never    Over the last 12 months how often did your partner insult you or talk down to you?: Never    Over the last 12 months how often did  your partner threaten you with physical harm?: Never    Over the last 12 months how often did your partner scream or curse at you?: Never     Review of Systems    General:  No chills, fever, night sweats or weight changes.  Cardiovascular:  No chest pain, dyspnea on exertion, edema, orthopnea, palpitations, paroxysmal nocturnal dyspnea. Dermatological: No rash, lesions/masses Respiratory: No cough, dyspnea Urologic: No hematuria, dysuria Abdominal:   No nausea, vomiting, diarrhea, bright red blood per rectum, melena, or hematemesis Neurologic:  No visual changes, wkns, changes in mental status. All other systems reviewed and are otherwise negative except as noted above.  Physical Exam    VS:  BP 118/76 (BP Location: Right Arm, Patient Position: Sitting, Cuff Size: Normal)   Ht 5\' 8"  (1.727 m)   Wt 158 lb 9.6 oz  (71.9 kg)   SpO2 96%   BMI 24.12 kg/m  , BMI Body mass index is 24.12 kg/m. GEN: Well nourished, well developed, in no acute distress. HEENT: normal. Neck: Supple, no JVD, carotid bruits, or masses. Cardiac: RRR, no murmurs, rubs, or gallops. No clubbing, cyanosis, edema.  Radials/DP/PT 2+ and equal bilaterally.  Respiratory:  Respirations regular and unlabored, clear to auscultation bilaterally. GI: Soft, nontender, nondistended, BS + x 4. MS: no deformity or atrophy. Skin: warm and dry, no rash. Neuro:  Strength and sensation are intact. Psych: Normal affect.  Accessory Clinical Findings    Recent Labs: 11/14/2023: TSH 0.34   Recent Lipid Panel    Component Value Date/Time   CHOL 133 08/08/2011 0913   TRIG 151.0 (H) 08/08/2011 0913   HDL 43.10 08/08/2011 0913   CHOLHDL 3 08/08/2011 0913   VLDL 30.2 08/08/2011 0913   LDLCALC 60 08/08/2011 0913    ECG personally reviewed by me today- EKG Interpretation Date/Time:  Friday Apr 19 2024 11:38:43 EDT Ventricular Rate:  85 PR Interval:  172 QRS Duration:  90 QT Interval:  402 QTC Calculation: 478 R Axis:   -89  Text Interpretation: Normal sinus rhythm Low voltage QRS RSR' or QR pattern in V1 suggests right ventricular conduction delay Left anterior fascicular block Confirmed by Lawana Pray [phone]) on 04/19/2024 11:42:27 AM   EKG 03/15/2023 normal sinus rhythm possible right ventricular hypertrophy 94 bpm  EKG 04/04/2022 normal sinus rhythm incomplete right bundle branch block possible right ventricular hypertrophy 74 bpm- No acute changes  EKG 08/04/2020 Sinus rhythm 100 bpm, incomplete RBBB, inferior lateral infarct, no acute changes from 08/2019  Assessment & Plan   1.  Elevated heart rate-heart rate today 85.  Previously noted to have heart rate in the 90s.  EKG today shows SR 85 BPM.   Avoid triggers caffeine, chocolate, EtOH, dehydration etc. Continue propranolol  10 mg twice daily Maintain p.o.  hydration  Orthostasis-no recent symptoms. Continue to maintain p.o. hydration. Heart healthy low-sodium diet Change positions slowly-reviewed  History of chest discomfort-denies recent chest discomfort and exertional chest pain.  Her prior stress testing which was negative and showed normal LVEF. Continue aspirin  No plans for ischemic evaluation at this time.   Hyperlipidemia-LDL 85 on 12/24 Continue aspirin , rosuvastatin  High-fiber diet Follows with PCP   Disposition: Follow-up with Dr. Audery Blazing or me in 9-12 months .    Chet Cota. Alycen Mack NP-C    04/19/2024, 12:18 PM [facility] [address] Office [phone] Fax [phone]  Notice: This dictation was prepared with Dragon dictation along with smaller phrase technology. Any transcriptional errors that  result from this process are unintentional and may not be corrected upon review.  I spent 14 minutes examining this patient, reviewing medications, and using patient centered shared decision making involving her cardiac care.  I spent  20 minutes reviewing her past medical history,  medications, and prior cardiac tests.

## 2024-04-18 ENCOUNTER — Ambulatory Visit

## 2024-04-18 DIAGNOSIS — R293 Abnormal posture: Secondary | ICD-10-CM | POA: Diagnosis not present

## 2024-04-18 DIAGNOSIS — M25612 Stiffness of left shoulder, not elsewhere classified: Secondary | ICD-10-CM | POA: Diagnosis not present

## 2024-04-18 DIAGNOSIS — R600 Localized edema: Secondary | ICD-10-CM | POA: Diagnosis not present

## 2024-04-18 DIAGNOSIS — M25512 Pain in left shoulder: Secondary | ICD-10-CM

## 2024-04-18 DIAGNOSIS — I890 Lymphedema, not elsewhere classified: Secondary | ICD-10-CM | POA: Diagnosis not present

## 2024-04-18 NOTE — Therapy (Signed)
 OUTPATIENT PHYSICAL THERAPY  UPPER EXTREMITY ONCOLOGY TREATMENT  Patient Name: [name] MRN: [id number] DOB:1946-03-05, 78 y.o., female Today's Date: 04/18/2024  END OF SESSION:  PT End of Session - 04/18/24 1500     Visit Number 15    Number of Visits 18    Date for PT Re-Evaluation 04/22/24    PT Start Time 1500    PT Stop Time 1603    PT Time Calculation (min) 63 min    Activity Tolerance Patient tolerated treatment well    Behavior During Therapy WFL for tasks assessed/performed             Past Medical History:  Diagnosis Date   Arthritis    hands and knees   Complication of anesthesia    aspiration pna; following a colonoscopy, pt requests head of bed elevated if possible.   Constipation    Cough    Depression    Dysrhythmia    RBBB on 06-06-17 ekg    Elevated liver function tests    Esophageal stricture    Family history of breast cancer    Gallstones    Genetic testing 04/02/2018   STAT Breast panel with reflex to Multi-Cancer panel (83 genes) @ [facility] - No pathogenic mutations detected   GERD (gastroesophageal reflux disease)    Hiatal hernia    History of kidney stones    History of radiation therapy 07/09/2018- 08/03/18   Left Breast/ 40.05 Gy in 15 fractions. Left Breast boost 10 Gy in 5 fractions.    Hyperlipemia    Hypothyroidism    Insomnia    Malignant neoplasm of upper-outer quadrant of left female breast (HCC)    Nephrolithiasis    Personal history of radiation therapy    Pneumonia 2015   aspirated after colonosopy   Renal cyst    Past Surgical History:  Procedure Laterality Date   BREAST BIOPSY Left 05/31/2019   mri   BREAST BIOPSY Left 04/2019   malignant   BREAST LUMPECTOMY Left 04/17/2018   malignant   BREAST LUMPECTOMY WITH RADIOACTIVE SEED AND SENTINEL LYMPH NODE BIOPSY Left 04/17/2018   Procedure: LEFT BREAST LUMPECTOMY WITH BRACKETED RADIOACTIVE SEEDS AND SENTINEL LYMPH NODE BIOPSY;  Surgeon: Enid Harry, MD;   Location: [facility];  Service: General;  Laterality: Left;   BREAST SURGERY Left    Lumpectomy   CARDIOVASCULAR STRESS TEST  07/19/2006   EF 70%, NO EVIDENCE OF ISCHEMIA   CHOLECYSTECTOMY  12/30/10   COLONOSCOPY     DILATION AND CURETTAGE OF UTERUS     HYSTEROSCOPY WITH D & C N/A 06/07/2018   Procedure: DILATATION AND CURETTAGE /HYSTEROSCOPY;  Surgeon: Thurman Flores, MD;  Location: [facility];  Service: Gynecology;  Laterality: N/A;   inguinal herniography     NASAL SINUS SURGERY     RADIOACTIVE SEED GUIDED EXCISIONAL BREAST BIOPSY Right 04/17/2018   Procedure: RIGHT BREAST SEED GUIDED EXCISIONAL BIOPSY;  Surgeon: Enid Harry, MD;  Location: [facility];  Service: General;  Laterality: Right;   RE-EXCISION OF BREAST LUMPECTOMY Left 05/15/2018   Procedure: RE-EXCISION OF LEFT BREAST LUMPECTOMY, ASPIRATION OF LEFT AXILLARY SEROMA;  Surgeon: Enid Harry, MD;  Location: [facility];  Service: General;  Laterality: Left;   T & A     TOOTH EXTRACTION     TOTAL KNEE ARTHROPLASTY Left 09/11/2017   Procedure: LEFT TOTAL KNEE ARTHROPLASTY;  Surgeon: Liliane Rei, MD;  Location: [facility];  Service: Orthopedics;  Laterality: Left;  with block   US  ECHOCARDIOGRAPHY  07/17/2006   EF 55-60%   Patient Active Problem List   Diagnosis Date Noted   Other specified disorders of eustachian tube, right ear 11/15/2023   Chronic rhinitis 11/15/2023   Sensorineural hearing loss, bilateral 11/15/2023   Spinal stenosis of lumbar region with radiculopathy 10/12/2021   Chronic postoperative pain 10/12/2021   Breast mass, right 03/27/2020   Hepatic steatosis 01/22/2020   Genetic testing 04/02/2018   Family history of breast cancer    Malignant neoplasm of upper-outer quadrant of left breast in female, estrogen receptor positive (HCC) 03/16/2018   OA (osteoarthritis) of knee 09/11/2017   Infrapatellar bursitis of right knee 04/29/2014   Pneumonia, aspiration (HCC)  12/28/2012   HYPOTHYROIDISM 11/15/2010   HYPERLIPIDEMIA 11/15/2010   DEPRESSION 11/15/2010   GERD 11/15/2010   CONSTIPATION 11/15/2010   GALLSTONES 11/15/2010   RENAL CYST 11/15/2010   INSOMNIA UNSPECIFIED 11/15/2010   COUGH 11/15/2010   ABDOMINAL PAIN OTHER SPECIFIED SITE 11/15/2010   TRANSAMINASES, SERUM, ELEVATED 11/15/2010    REFERRING PROVIDER: Enid Harry, MD  REFERRING DIAG: Left arm arm swelling  THERAPY DIAG:  Abnormal posture  Left shoulder pain, unspecified chronicity  Lymphedema, not elsewhere classified  ONSET DATE: 2-3 months ago  Rationale for Evaluation and Treatment: Rehabilitation  SUBJECTIVE:                                                                                                                                                                                           SUBJECTIVE STATEMENT:  My gauntlet hasn't arrived yet, nor has the [employer]. I did the exercises with the band but I had some left shoulder pain. I did the lying down ones in addition to the standing  PERTINENT HISTORY:  04/17/2018 Bilateral lumpectomies with left SLNB with 0+/1 LN. Gr. 2 ILC Er/PR+, HER 2-, Ki 67 5%. Radiation 09/11/2018 -08/03/2018. Most recent prior PT treatment 04/05/2022 to 05/17/22 with this therapist. 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  PAIN:  Are you having pain? No, not today  PRECAUTIONS: Left TKA, sciatica, Lympedema(left), small brain aneurysm  RED FLAGS: None   WEIGHT BEARING RESTRICTIONS: No  FALLS:  Has patient fallen in last 6 months? No  LIVING ENVIRONMENT: Lives with: lives with their spouse Lives in: House/apartment OCCUPATION: retired   LEISURE: reading , gardening  HAND DOMINANCE: right   PRIOR LEVEL OF FUNCTION: Independent  PATIENT GOALS: ease the pain, move better, check swelling   OBJECTIVE: Note: Objective measures were completed at Evaluation unless otherwise noted.  COGNITION: Overall cognitive status: Within  functional limits for tasks assessed   PALPATION: Tender  at proximal biceps, supraspinatus, mid deltoid,tender left pectorals, breast incision on left.  OBSERVATIONS / OTHER ASSESSMENTS: positive left impingement, positive empty  SENSATION: Light touch: Appears intact   POSTURE: forward head, rounded shoulders  UPPER EXTREMITY AROM/PROM:  A/PROM RIGHT   eval   Shoulder extension 65  Shoulder flexion 120  Shoulder abduction 164, slight scaption  Shoulder internal rotation 75  Shoulder external rotation 90    (Blank rows = not tested)  A/PROM LEFT   eval  Shoulder extension 54, tight  Shoulder flexion 120, tight , mild pain at top of shoulder  Shoulder abduction 90  Shoulder internal rotation   Shoulder external rotation     (Blank rows = not tested)  CERVICAL AROM:  within functional limits:     UPPER EXTREMITY STRENGTH: functional but limited by pain on left  LYMPHEDEMA ASSESSMENTS:   SURGERY TYPE/DATE: 04/17/2018 Bilateral lumpectomies with Left SLNB, had a seroma on left  NUMBER OF LYMPH NODES REMOVED: 0+/1  CHEMOTHERAPY: NO  RADIATION:Yes   HORMONE TREATMENT: end 07/2018  INFECTIONS: NO   LYMPHEDEMA ASSESSMENTS:   LANDMARK RIGHT  eval  At axilla  33.0  15 cm proximal to olecranon process 32.3  10 cm proximal to olecranon process 30.8  Olecranon process 25.7  15 cm proximal to ulnar styloid process 23.3  10 cm proximal to ulnar styloid process 20.1  Just proximal to ulnar styloid process 14.2  Across hand at thumb web space 18.0  At base of 2nd digit 6.2  (Blank rows = not tested)  LANDMARK LEFT  eval Left 03/15/24 LEFT 03/20/2024 LEFT 03/22/2024 Left 04/03/24  At axilla  33.7  32.3  31.7  15 cm proximal to olecranon process 33.5  33.3  30.7  10 cm proximal to olecranon process 32.3  31.9 30.7 29.6  Olecranon process 26.8  26.7 26.2 25.4  15 cm proximal to ulnar styloid process 23.3  23.5 23.5 23.5  10 cm proximal to ulnar styloid process 20.0   20.1 20.1 20.4  Just proximal to ulnar styloid process 15.25 15.2 15.2 15.0 14.8  Across hand at thumb web space 18.4  17.8  16.7  At base of 2nd digit 6.1  6.0 5.9 5.8  (Blank rows = not tested)   FUNCTIONAL TESTS:    GAIT: flexed posture,increased kyphosis, independent     QUICK DASH SURVEY: 40.91                                                                                                                            TREATMENT DATE:   04/18/2024  scapular retraction, shoulder extension and bilateral ER using good posture x 10 with yellow band.  Also performed supine horizontal abduction with yellow x 10. Pt advised to discontinue supine exercises that involve going overhead. May do horizontal abd only. Pt in sitting to practice MLD with therapist demonstrating and pt return demonstrating.  Short neck, 5 diaphragmatic breaths, R axillary nodes and establishment  of interaxillary pathway, L inguinal nodes and establishment of axilloinguinal pathway, then L UE working proximal to distal, moving fluid from upper inner arm outwards, and doing both sides of forearm moving fluid towards pathways spending extra time in any areas of fibrosis then retracing all steps  and ending with LN's. Pt practiced each step after watching therapist. She required Max VC's and tactile cues initially to get proper stretch, but did exceptionally well after practicing with a more relaxed hand. Written handout for MLD given to pt. Pt has still not received gauntlet or night garment but [employer] did message to say it is with insurance. She has not yet heard from [employer]. Will wait until Monday before ordering garments to see if her gauntlet arrives over the weekend. Measure next, review MLD, Recert.  04/15/2024 Educated pt in standing theraband exercises including scapular retraction, shoulder extension and bilateral ER using good posture x 10 and updated HEP. Also performed supine horizontal abduction with yellow x  10. Discussed garments and pt would like 2 more sleeves, another gauntlet and a glove. Faxed script to Dr. Delane Fear STM to Lt axilla at pect insertionT, then to lateral trunk where still tight with cocoa butter PROM to left shoulder flexion, scaption, abduction and ER with VC's prn to relax In supine: Short neck, superficial and deep abdominals, R axillary nodes and establishment of interaxillary pathway, L inguinal nodes and establishment of axilloinguinal pathway, then L UE working proximal to distal, moving fluid from upper inner arm outwards, and doing both sides of forearm, and hand moving fluid towards pathways spending extra time in any areas of fibrosis then retracing all steps  Had pt use arm butler for donning her sleeve and used gloves to assist  Did very well overall.. Discussed teaching pt MLD in sitting next visit. 04/12/24: Self Care Showed pt 2 donning butler options: slippie gator and Medi donning butler . Then got our Medi butler for pt to try using this. She preferred this option so assisted her with ordering this from [employer]. Therapeutic Activities Postural strength with supine scapular series with yellow therband as follows: Bil Horz abd, bil UE er, and narrow and wide grips flex x 10 each returning therapist demo, then D2 x 5 each UE returning demo and tactile cues for correct technique Manual Therapy MLD to Lt UE: Short neck, superficial and deep abdominals, Lt inguinal nodes and Lt axillo-inguinal anastomosis, Rt axillary and pectoral nodes, anterior intact thorax sequence, and then anterior inter-axillary anastomosis, then Lt UE working from proximal to distal and retracing all steps back to anastomosis and ending with LN's.  P/ROM to Lt shoulder into flex, abd and D2 with scapular depression by therapist throughout; end motions are improved today STM to Lt axilla at pect insertion that conts to tight but softened some during MT, then to lateral trunk where still tight but  much less so Donned pts compression sleeve after manual therapy  04/10/24: Self Care Assisted pt with reordering a medium size gauntlet as she returned the size small yesterday. This should arrive in 4-8 days. Also called [employer] for pt to verify that they are going to credit her card and not her [employer] account.  Manual Therapy MLD to Lt UE: Short neck, superficial and deep abdominals, Lt inguinal nodes and Lt axillo-inguinal anastomosis, Rt axillary and pectoral nodes, anterior intact thorax sequence, and then anterior inter-axillary anastomosis, then Lt UE working from proximal to distal and retracing all steps back to anastomosis and ending  with LN's.  P/ROM to Lt shoulder into flex, abd and D2 with scapular depression by therapist throughout; end motions are improved today STM to Lt axilla at scar tissue and pect insertion that was palpably tight today, overall scar tissue is much softer now and pt with no tenderness Donned pts compression sleeve after manual therapy  04/08/2024 Pt brought in small exostrong gauntlet and medium sleeve. Gauntlet was much to small and pt will return for a medium. She willl call when she returns home today. Tried on pts sleeve and she was able to don with PT cueing and using gloves. Seems to be a good fit. Removed for treatment. STM to left UT/levator, pectorals and lateral trunk with cocoa butter PROM left shoulder flexion, scaption, abduction, ER In supine: Short neck, 5 diaphragmatic breaths, R axillary nodes and establishment of interaxillary pathway, L inguinal nodes and establishment of axilloinguinal pathway, then L UE working proximal to distal, moving fluid from upper inner arm outwards, and doing both sides of forearm moving fluid towards pathways spending extra time in any areas of fibrosis then retracing all steps . At completion of MLD instructed pt in use of EZ slide which worked well to get her sleeve on easily, but didn't hold sleeve enough  when therapist removed EZ and it slid down. Used gloves to get sleeve up high enough. Pt is to try sleeve without gauntlet and may wear as long as her hand/fingers don't swell, and remove tonight before bed time. She may reapply tomorrow again watching to be sure her hand is not swelling. If it does swell she will discontinue sleeve. She is to wear or bring sleeve/bandages next visit.       PATIENT EDUCATION:  04/15/2024 Standing Scapular retraction, bilateral shoulder ext and bilateral ER with yellow x 10. Emphasis on proper posture Education details: Supine scapular series Person educated: Patient Education method: Explanation, demonstration, tactile and VC's and handout issued Education comprehension: verbalized understanding, returned demo, tactile cues and pt will benefit from further review  HOME EXERCISE PROGRAM: Supine scapular series with yellow theraband, standing postureal exercises;SR, shoulder ext, bilateral eR  ASSESSMENT:  CLINICAL IMPRESSION:  Pt did well again today with standing Tband exercises and supine horizontal abd. Advised pt again not to do band exercises that go above her head due to increased pain with them. Pt improved greatly from beginning of session to the end and was able to return demonstrate excellent stretch and understanding of sequence.. Will need further review next visit.  OBJECTIVE IMPAIRMENTS: decreased activity tolerance, decreased knowledge of condition, decreased ROM, decreased strength, increased edema, impaired UE functional use, postural dysfunction, and pain.   ACTIVITY LIMITATIONS: lifting, sleeping, dressing, and reach over head  PARTICIPATION LIMITATIONS: cleaning and laundry  PERSONAL FACTORS:  Left breast Cancer, radiation are also patient's functional outcome.   REHAB POTENTIAL: Good  CLINICAL DECISION MAKING: Evolving/moderate complexity  EVALUATION COMPLEXITY: Moderate  GOALS: Goals reviewed with patient? Yes  SHORT TERM  GOALS: Target date: 04/01/2024  Pt will be independent and compliant with HEP for left shoulder ROM/strength  Baseline: Goal status: MET  2.  Pt will be fit for appropriate flat knit compression sleeve to address left UE lymphedema Baseline:  Goal status: MET 04/08/2024 3.  Pt will have decreased left shoulder/axillary pain by 25% or more Baseline:  Goal status: INITIAL  4.  Quick dash will improve to greater than 30% to demonstrate improved function Baseline:  Goal status: INITIAL   LONG TERM GOALS: Target  date: 04/22/2024  Pt will have decreased complaints left shoulder/axillary pain  by 50%  Baseline:  Goal status: INITIAL  2.  Pt will have left shoulder ROM WNL as compared to right UE   Baseline:  Goal status: INITIAL  3.  Pts quick dash will be no greater than 22% to demonstrate improved function in left shoulder Baseline:  Goal status: INITIAL  4.  Pt will be educated in self MLD to decrease left UE swelling prn Baseline:  Goal status: INITIAL   PLAN:  PT FREQUENCY: 2-3x/week  PT DURATION: 6 weeks  PLANNED INTERVENTIONS: 97164- PT Re-evaluation, 97110-Therapeutic exercises, 97530- Therapeutic activity, 97112- Neuromuscular re-education, 97535- Self Care, 16109- Manual therapy, [phone]- Orthotic Fit/training, Patient/Family education, and Joint mobilization  PLAN FOR NEXT SESSION: RECERT MONDAY, Measure,check goals, check gauntlet when it arrives, if good fit assist pt with ordering more sleeves and gauntlet (maybe one glove?), Review  Self MLD in sitting next,Cont STM left UT, axillary border of pectorals, try supine scapular series or gentle isometrics if not tolerated, MLD to left UE and instruct pt  04/03/24 - Faxed demographics and PT note to [employer] and [employer]. Also faxed script to Dr .Delane Fear for the nighttime garment. VR 04/15/2024; messaged Leah re; flexi, Faxed script to Dr. Delane Fear for garments to order through insurance Latisha Poland,  PT 04/18/2024, 4:36 PM  Over Head Pull: Narrow and Wide Grip   [facility] [phone]   On back, knees bent, feet flat, band across thighs, elbows straight but relaxed. Pull hands apart (start). Keeping elbows straight, bring arms up and over head, hands toward floor. Keep pull steady on band. Hold momentarily. Return slowly, keeping pull steady, back to start. Then do same with a wider grip on the band (past shoulder width) Repeat _5-10__ times. Band color __yellow____   Side Pull: Double Arm   On back, knees bent, feet flat. Arms perpendicular to body, shoulder level, elbows straight but relaxed. Pull arms out to sides, elbows straight. Resistance band comes across collarbones, hands toward floor. Hold momentarily. Slowly return to starting position. Repeat _5-10__ times. Band color _yellow____   Sword   On back, knees bent, feet flat, left hand on left hip, right hand above left. Pull right arm DIAGONALLY (hip to shoulder) across chest. Bring right arm along head toward floor. Hold momentarily. Slowly return to starting position. Repeat _5-10__ times. Do with left arm. Band color _yellow_____   Shoulder Rotation: Double Arm   On back, knees bent, feet flat, elbows tucked at sides, bent 90, hands palms up. Pull hands apart and down toward floor, keeping elbows near sides. Hold momentarily. Slowly return to starting position. Repeat _5-10__ times. Band color __yellow____

## 2024-04-19 ENCOUNTER — Encounter: Payer: Self-pay | Admitting: General Practice

## 2024-04-19 ENCOUNTER — Ambulatory Visit: Attending: General Practice | Admitting: General Practice

## 2024-04-19 VITALS — BP 118/76 | Ht 68.0 in | Wt 158.6 lb

## 2024-04-19 DIAGNOSIS — R0789 Other chest pain: Secondary | ICD-10-CM | POA: Insufficient documentation

## 2024-04-19 DIAGNOSIS — R009 Unspecified abnormalities of heart beat: Secondary | ICD-10-CM | POA: Insufficient documentation

## 2024-04-19 DIAGNOSIS — I951 Orthostatic hypotension: Secondary | ICD-10-CM | POA: Diagnosis not present

## 2024-04-19 DIAGNOSIS — E7800 Pure hypercholesterolemia, unspecified: Secondary | ICD-10-CM | POA: Diagnosis not present

## 2024-04-19 DIAGNOSIS — R030 Elevated blood-pressure reading, without diagnosis of hypertension: Secondary | ICD-10-CM | POA: Diagnosis not present

## 2024-04-19 DIAGNOSIS — R42 Dizziness and giddiness: Secondary | ICD-10-CM | POA: Diagnosis not present

## 2024-04-19 NOTE — Patient Instructions (Signed)
 Medication Instructions:  Your physician recommends that you continue on your current medications as directed. Please refer to the Current Medication list given to you today.  *If you need a refill on your cardiac medications before your next appointment, please call your pharmacy*  Lab Work: NONE If you have labs (blood work) drawn today and your tests are completely normal, you will receive your results only by: MyChart Message (if you have MyChart) OR A paper copy in the mail If you have any lab test that is abnormal or we need to change your treatment, we will call you to review the results.  Testing/Procedures: NONE  Follow-Up: At [facility], you and your health needs are our priority.  As part of our continuing mission to provide you with exceptional heart care, our providers are all part of one team.  This team includes your primary Cardiologist (physician) and Advanced Practice Providers or APPs (Physician Assistants and Nurse Practitioners) who all work together to provide you with the care you need, when you need it.  Your next appointment:   9-12 month(s)  Provider:   Alexandria Angel, MD OR APP  We recommend signing up for the patient portal called "MyChart".  Sign up information is provided on this After Visit Summary.  MyChart is used to connect with patients for Virtual Visits (Telemedicine).  Patients are able to view lab/test results, encounter notes, upcoming appointments, etc.  Non-urgent messages can be sent to your provider as well.   To learn more about what you can do with MyChart, go to [email or web address].   Other Instructions INCREASE HYDRATION

## 2024-04-22 ENCOUNTER — Ambulatory Visit

## 2024-04-22 DIAGNOSIS — R600 Localized edema: Secondary | ICD-10-CM

## 2024-04-22 DIAGNOSIS — M25612 Stiffness of left shoulder, not elsewhere classified: Secondary | ICD-10-CM

## 2024-04-22 DIAGNOSIS — I890 Lymphedema, not elsewhere classified: Secondary | ICD-10-CM

## 2024-04-22 DIAGNOSIS — M25512 Pain in left shoulder: Secondary | ICD-10-CM | POA: Diagnosis not present

## 2024-04-22 DIAGNOSIS — R293 Abnormal posture: Secondary | ICD-10-CM | POA: Diagnosis not present

## 2024-04-22 NOTE — Therapy (Signed)
 OUTPATIENT PHYSICAL THERAPY  UPPER EXTREMITY ONCOLOGY TREATMENT  Patient Name: [name] MRN: [id number] DOB:[birth year], 78 y.o., female Today's Date: 04/22/2024  END OF SESSION:  PT End of Session - 04/22/24 1509     Visit Number 16    Number of Visits 22    Date for PT Re-Evaluation 05/13/24    Authorization Type Medicare    PT Start Time 1509    PT Stop Time 1610    PT Time Calculation (min) 61 min    Activity Tolerance Patient tolerated treatment well    Behavior During Therapy WFL for tasks assessed/performed             Past Medical History:  Diagnosis Date   Arthritis    hands and knees   Complication of anesthesia    aspiration pna; following a colonoscopy, pt requests head of bed elevated if possible.   Constipation    Cough    Depression    Dysrhythmia    RBBB on 06-06-17 ekg    Elevated liver function tests    Esophageal stricture    Family history of breast cancer    Gallstones    Genetic testing 04/02/2018   STAT Breast panel with reflex to Multi-Cancer panel (83 genes) @ [facility] - No pathogenic mutations detected   GERD (gastroesophageal reflux disease)    Hiatal hernia    History of kidney stones    History of radiation therapy 07/09/2018- 08/03/18   Left Breast/ 40.05 Gy in 15 fractions. Left Breast boost 10 Gy in 5 fractions.    Hyperlipemia    Hypothyroidism    Insomnia    Malignant neoplasm of upper-outer quadrant of left female breast (HCC)    Nephrolithiasis    Personal history of radiation therapy    Pneumonia 2015   aspirated after colonosopy   Renal cyst    Past Surgical History:  Procedure Laterality Date   BREAST BIOPSY Left 05/31/2019   mri   BREAST BIOPSY Left 04/2019   malignant   BREAST LUMPECTOMY Left 04/17/2018   malignant   BREAST LUMPECTOMY WITH RADIOACTIVE SEED AND SENTINEL LYMPH NODE BIOPSY Left 04/17/2018   Procedure: LEFT BREAST LUMPECTOMY WITH BRACKETED RADIOACTIVE SEEDS AND SENTINEL LYMPH NODE BIOPSY;   Surgeon: Enid Harry, MD;  Location: [facility];  Service: General;  Laterality: Left;   BREAST SURGERY Left    Lumpectomy   CARDIOVASCULAR STRESS TEST  07/19/2006   EF 70%, NO EVIDENCE OF ISCHEMIA   CHOLECYSTECTOMY  12/30/10   COLONOSCOPY     DILATION AND CURETTAGE OF UTERUS     HYSTEROSCOPY WITH D & C N/A 06/07/2018   Procedure: DILATATION AND CURETTAGE /HYSTEROSCOPY;  Surgeon: Thurman Flores, MD;  Location: [facility];  Service: Gynecology;  Laterality: N/A;   inguinal herniography     NASAL SINUS SURGERY     RADIOACTIVE SEED GUIDED EXCISIONAL BREAST BIOPSY Right 04/17/2018   Procedure: RIGHT BREAST SEED GUIDED EXCISIONAL BIOPSY;  Surgeon: Enid Harry, MD;  Location:  SUR[facility]ERY CENTER;  Service: General;  Laterality: Right;   RE-EXCISION OF BREAST LUMPECTOMY Left 05/15/2018   Procedure: RE-EXCISION OF LEFT BREAST LUMPECTOMY, ASPIRATION OF LEFT AXILLARY SEROMA;  Surgeon: Enid Harry, MD;  Location:  SUR[facility]ERY CENTER;  Service: General;  Laterality: Left;   T & A     TOOTH EXTRACTION     TOTAL KNEE ARTHROPLASTY Left 09/11/2017   Procedure: LEFT TOTAL KNEE ARTHROPLASTY;  Surgeon: Liliane Rei, MD;  Location: [facility];  Service: Orthopedics;  Laterality: Left;  with block   US  ECHOCARDIOGRAPHY  07/17/2006   EF 55-60%   Patient Active Problem List   Diagnosis Date Noted   Other specified disorders of eustachian tube, right ear 11/15/2023   Chronic rhinitis 11/15/2023   Sensorineural hearing loss, bilateral 11/15/2023   Spinal stenosis of lumbar region with radiculopathy 10/12/2021   Chronic postoperative pain 10/12/2021   Breast mass, right 03/27/2020   Hepatic steatosis 01/22/2020   Genetic testing 04/02/2018   Family history of breast cancer    Malignant neoplasm of upper-outer quadrant of left breast in female, estrogen receptor positive (HCC) 03/16/2018   OA (osteoarthritis) of knee 09/11/2017   Infrapatellar bursitis of right knee  04/29/2014   Pneumonia, aspiration (HCC) 12/28/2012   HYPOTHYROIDISM 11/15/2010   HYPERLIPIDEMIA 11/15/2010   DEPRESSION 11/15/2010   GERD 11/15/2010   CONSTIPATION 11/15/2010   GALLSTONES 11/15/2010   RENAL CYST 11/15/2010   INSOMNIA UNSPECIFIED 11/15/2010   COUGH 11/15/2010   ABDOMINAL PAIN OTHER SPECIFIED SITE 11/15/2010   TRANSAMINASES, SERUM, ELEVATED 11/15/2010    REFERRING PROVIDER: Enid Harry, MD  REFERRING DIAG: Left arm arm swelling  THERAPY DIAG:  Abnormal posture  Left shoulder pain, unspecified chronicity  Lymphedema, not elsewhere classified  ONSET DATE: 2-3 months ago  Rationale for Evaluation and Treatment: Rehabilitation  SUBJECTIVE:                                                                                                                                                                                           SUBJECTIVE STATEMENT:  My gauntlet and Randal Bury came over the weekend. The gauntlet is tight on my thumb and loose at my wrist. I did the MLD every day. I tried the [place] multiple times but it is different than what we used here and I can't get it to work.   PERTINENT HISTORY:  04/17/2018 Bilateral lumpectomies with left SLNB with 0+/1 LN. Gr. 2 ILC Er/PR+, HER 2-, Ki 67 5%. Radiation 09/11/2018 -08/03/2018. Most recent prior PT treatment 04/05/2022 to 05/17/22 with this therapist. 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  PAIN:  Are you having pain? No, not today  PRECAUTIONS: Left TKA, sciatica, Lympedema(left), small brain aneurysm  RED FLAGS: None   WEIGHT BEARING RESTRICTIONS: No  FALLS:  Has patient fallen in last 6 months? No  LIVING ENVIRONMENT: Lives with: lives with their spouse Lives in: House/apartment OCCUPATION: retired   LEISURE: reading , gardening  HAND DOMINANCE: right   PRIOR LEVEL OF FUNCTION: Independent  PATIENT GOALS: ease the pain, move better, check swelling   OBJECTIVE: Note: Objective measures were  completed at Evaluation unless otherwise noted.  COGNITION: Overall cognitive status: Within functional limits for tasks assessed   PALPATION: Tender at proximal biceps, supraspinatus, mid deltoid,tender left pectorals, breast incision on left.  OBSERVATIONS / OTHER ASSESSMENTS: positive left impingement, positive empty  SENSATION: Light touch: Appears intact   POSTURE: forward head, rounded shoulders  UPPER EXTREMITY AROM/PROM:  A/PROM RIGHT   eval   Shoulder extension 65  Shoulder flexion 120  Shoulder abduction 164, slight scaption  Shoulder internal rotation 75  Shoulder external rotation 90    (Blank rows = not tested)  A/PROM LEFT   eval  Shoulder extension 54, tight  Shoulder flexion 120, tight , mild pain at top of shoulder  Shoulder abduction 90  Shoulder internal rotation   Shoulder external rotation     (Blank rows = not tested)  CERVICAL AROM:  within functional limits:     UPPER EXTREMITY STRENGTH: functional but limited by pain on left  LYMPHEDEMA ASSESSMENTS:   SURGERY TYPE/DATE: 04/17/2018 Bilateral lumpectomies with Left SLNB, had a seroma on left  NUMBER OF LYMPH NODES REMOVED: 0+/1  CHEMOTHERAPY: NO  RADIATION:Yes   HORMONE TREATMENT: end 07/2018  INFECTIONS: NO   LYMPHEDEMA ASSESSMENTS:   LANDMARK RIGHT  eval  At axilla  33.0  15 cm proximal to olecranon process 32.3  10 cm proximal to olecranon process 30.8  Olecranon process 25.7  15 cm proximal to ulnar styloid process 23.3  10 cm proximal to ulnar styloid process 20.1  Just proximal to ulnar styloid process 14.2  Across hand at thumb web space 18.0  At base of 2nd digit 6.2  (Blank rows = not tested)  LANDMARK LEFT  eval Left 03/15/24 LEFT 03/20/2024 LEFT 03/22/2024 Left 04/03/24 LEFT 04/22/2024  At axilla  33.7  32.3  31.7 32.9  15 cm proximal to olecranon process 33.5  33.3  30.7 30.9  10 cm proximal to olecranon process 32.3  31.9 30.7 29.6 29.8  Olecranon process 26.8   26.7 26.2 25.4 25.4  15 cm proximal to ulnar styloid process 23.3  23.5 23.5 23.5 22.9  10 cm proximal to ulnar styloid process 20.0  20.1 20.1 20.4 20.3  Just proximal to ulnar styloid process 15.25 15.2 15.2 15.0 14.8 14.9  Across hand at thumb web space 18.4  17.8  16.7 17.8  At base of 2nd digit 6.1  6.0 5.9 5.8 5.8  (Blank rows = not tested)   FUNCTIONAL TESTS:    GAIT: flexed posture,increased kyphosis, independent     QUICK DASH SURVEY: 40.91 EVAL, 04/22/2024 43.18                                                                                                                            TREATMENT DATE:   04/22/2024 Pt remeasured for Recert Reviewed goals with pt. For recert At pt request called [employer] to advise pt proper way to make return with the leg donning butler and  order the arm butler, We tried having pt use the leg butler to don her sleeve, but she had difficulty and wants the proper arm butler Then used [employer] to order the arm Randal Bury for pt to be delivered to her home. [employer] emailed pt how to make return of the other item. Checked gauntlet large at wrist and small at thumb. Pt will try stretching thumb with high lighter overnight. Her hand is not swelling with sleeve on. Advised she should not continue to wear the gauntlet if it is too tight on thumb or causing numbness or pain. Also discussed exercises and she indicates she is still doing band exercises that cause shoulder pain. Pt does have some memory deficits and was advised again to discontinue supine exercises and do standing ones only. Did not have time to review self MLD, although pt feels she is doing well.  04/18/2024  scapular retraction, shoulder extension and bilateral ER using good posture x 10 with yellow band.  Also performed supine horizontal abduction with yellow x 10. Pt advised to discontinue supine exercises that involve going overhead. May do horizontal abd only. Pt in sitting  to practice MLD with therapist demonstrating and pt return demonstrating.  Short neck, 5 diaphragmatic breaths, R axillary nodes and establishment of interaxillary pathway, L inguinal nodes and establishment of axilloinguinal pathway, then L UE working proximal to distal, moving fluid from upper inner arm outwards, and doing both sides of forearm moving fluid towards pathways spending extra time in any areas of fibrosis then retracing all steps  and ending with LN's. Pt practiced each step after watching therapist. She required Max VC's and tactile cues initially to get proper stretch, but did exceptionally well after practicing with a more relaxed hand. Written handout for MLD given to pt. Pt has still not received gauntlet or night garment but [employer] did message to say it is with insurance. She has not yet heard from [employer]. Will wait until Monday before ordering garments to see if her gauntlet arrives over the weekend. Measure next, review MLD, Recert.  04/15/2024 Educated pt in standing theraband exercises including scapular retraction, shoulder extension and bilateral ER using good posture x 10 and updated HEP. Also performed supine horizontal abduction with yellow x 10. Discussed garments and pt would like 2 more sleeves, another gauntlet and a glove. Faxed script to Dr. Delane Fear STM to Lt axilla at pect insertionT, then to lateral trunk where still tight with cocoa butter PROM to left shoulder flexion, scaption, abduction and ER with VC's prn to relax In supine: Short neck, superficial and deep abdominals, R axillary nodes and establishment of interaxillary pathway, L inguinal nodes and establishment of axilloinguinal pathway, then L UE working proximal to distal, moving fluid from upper inner arm outwards, and doing both sides of forearm, and hand moving fluid towards pathways spending extra time in any areas of fibrosis then retracing all steps  Had pt use arm butler for donning her sleeve and  used gloves to assist  Did very well overall.. Discussed teaching pt MLD in sitting next visit. 04/12/24: Self Care Showed pt 2 donning butler options: slippie gator and Medi donning butler . Then got our Medi butler for pt to try using this. She preferred this option so assisted her with ordering this from [employer]. Therapeutic Activities Postural strength with supine scapular series with yellow therband as follows: Bil Horz abd, bil UE er, and narrow and wide grips flex x 10 each returning therapist demo,  then D2 x 5 each UE returning demo and tactile cues for correct technique Manual Therapy MLD to Lt UE: Short neck, superficial and deep abdominals, Lt inguinal nodes and Lt axillo-inguinal anastomosis, Rt axillary and pectoral nodes, anterior intact thorax sequence, and then anterior inter-axillary anastomosis, then Lt UE working from proximal to distal and retracing all steps back to anastomosis and ending with LN's.  P/ROM to Lt shoulder into flex, abd and D2 with scapular depression by therapist throughout; end motions are improved today STM to Lt axilla at pect insertion that conts to tight but softened some during MT, then to lateral trunk where still tight but much less so Donned pts compression sleeve after manual therapy  04/10/24: Self Care Assisted pt with reordering a medium size gauntlet as she returned the size small yesterday. This should arrive in 4-8 days. Also called [employer] for pt to verify that they are going to credit her card and not her [employer] account.  Manual Therapy MLD to Lt UE: Short neck, superficial and deep abdominals, Lt inguinal nodes and Lt axillo-inguinal anastomosis, Rt axillary and pectoral nodes, anterior intact thorax sequence, and then anterior inter-axillary anastomosis, then Lt UE working from proximal to distal and retracing all steps back to anastomosis and ending with LN's.  P/ROM to Lt shoulder into flex, abd and D2 with scapular depression by  therapist throughout; end motions are improved today STM to Lt axilla at scar tissue and pect insertion that was palpably tight today, overall scar tissue is much softer now and pt with no tenderness Donned pts compression sleeve after manual therapy  04/08/2024 Pt brought in small exostrong gauntlet and medium sleeve. Gauntlet was much to small and pt will return for a medium. She willl call when she returns home today. Tried on pts sleeve and she was able to don with PT cueing and using gloves. Seems to be a good fit. Removed for treatment. STM to left UT/levator, pectorals and lateral trunk with cocoa butter PROM left shoulder flexion, scaption, abduction, ER In supine: Short neck, 5 diaphragmatic breaths, R axillary nodes and establishment of interaxillary pathway, L inguinal nodes and establishment of axilloinguinal pathway, then L UE working proximal to distal, moving fluid from upper inner arm outwards, and doing both sides of forearm moving fluid towards pathways spending extra time in any areas of fibrosis then retracing all steps . At completion of MLD instructed pt in use of EZ slide which worked well to get her sleeve on easily, but didn't hold sleeve enough when therapist removed EZ and it slid down. Used gloves to get sleeve up high enough. Pt is to try sleeve without gauntlet and may wear as long as her hand/fingers don't swell, and remove tonight before bed time. She may reapply tomorrow again watching to be sure her hand is not swelling. If it does swell she will discontinue sleeve. She is to wear or bring sleeve/bandages next visit.       PATIENT EDUCATION:  04/15/2024 Standing Scapular retraction, bilateral shoulder ext and bilateral ER with yellow x 10. Emphasis on proper posture Education details: Supine scapular series Person educated: Patient Education method: Explanation, demonstration, tactile and VC's and handout issued Education comprehension: verbalized understanding,  returned demo, tactile cues and pt will benefit from further review  HOME EXERCISE PROGRAM: Supine scapular series with yellow theraband, standing postureal exercises;SR, shoulder ext, bilateral eR Discontinue Supine exs except horizontal abd due to pain ASSESSMENT:  CLINICAL IMPRESSION:  Pt was remeasured  today and her sleeve seems to be holding her quite well with only slight variations here and there. She notes shoulder pain improved by 25%, but still having shoulder pain with supine band exercises which she has been advised to discontinue, and only do standing ones that were not painful. We discussed progress toward goals, and also garments and donning butler. Gauntlet is not a great fit and we may need to look at other options. She will try and stretch the thumb on a high lighter tonight. The proper arm butler was ordered on [employer] today and she will return the leg Randal Bury using instructions emailed to her by [employer]. She will benefit from several more sessions to review MLD, check other gauntlets for sizing, check arm butler and review proper way to don with her, and to order more sleeves/gauntlets with her insurance.   OBJECTIVE IMPAIRMENTS: decreased activity tolerance, decreased knowledge of condition, decreased ROM, decreased strength, increased edema, impaired UE functional use, postural dysfunction, and pain.   ACTIVITY LIMITATIONS: lifting, sleeping, dressing, and reach over head  PARTICIPATION LIMITATIONS: cleaning and laundry  PERSONAL FACTORS:  Left breast Cancer, radiation are also patient's functional outcome.   REHAB POTENTIAL: Good  CLINICAL DECISION MAKING: Evolving/moderate complexity  EVALUATION COMPLEXITY: Moderate  GOALS: Goals reviewed with patient? Yes  SHORT TERM GOALS: Target date: 04/01/2024  Pt will be independent and compliant with HEP for left shoulder ROM/strength  Baseline: Goal status: MET  2.  Pt will be fit for appropriate flat knit  compression sleeve to address left UE lymphedema Baseline:  Goal status: MET 04/08/2024 3.  Pt will have decreased left shoulder/axillary pain by 25% or more Baseline:  Goal status: MET 4.  Quick dash will improve to greater than 30% to demonstrate improved function Baseline:  Goal status:In Progress; slightly worse today  LONG TERM GOALS: Target date: 04/22/2024  Pt will have decreased complaints left shoulder/axillary pain  by 50%  Baseline:  Goal status: In Progress 2.  Pt will have left shoulder ROM WNL as compared to right UE   Baseline:  Goal status: In Progress 3.  Pts quick dash will be no greater than 22% to demonstrate improved function in left shoulder Baseline:  Goal status: In Progress  4.  Pt will be educated in self MLD to decrease left UE swelling prn Baseline:  Goal status: In Progress, doing at home but needs 1 session to review  PLAN:  PT FREQUENCY: 2x/week  PT DURATION: 3  weeks  PLANNED INTERVENTIONS: 97164- PT Re-evaluation, 97110-Therapeutic exercises, 97530- Therapeutic activity, 97112- Neuromuscular re-education, 97535- Self Care, 16109- Manual therapy, [phone]- Orthotic Fit/training, Patient/Family education, and Joint mobilization  PLAN FOR NEXT SESSION, check gauntlet; did she stretch thumb, another brand that fits better? When ready  assist pt with ordering more sleeves and gauntlet (maybe one glove?), Review  Self MLD in sitting next,Cont STM left UT, axillary border of pectorals, Review standing Band exs,  04/03/24 - Faxed demographics and PT note to [employer] and [employer] fit. Also faxed script to Dr .Delane Fear for the nighttime garment. VR 04/15/2024; messaged Leah re; flexi, Faxed script to Dr. Delane Fear for garments to order through insurance Latisha Poland, PT 04/22/2024, 5:20 PM  Over Head Pull: Narrow and Wide Grip   Cancer Rehab [phone]   On back, knees bent, feet flat, band across thighs, elbows straight but relaxed. Pull hands  apart (start). Keeping elbows straight, bring arms up and over head, hands toward floor. Keep pull  steady on band. Hold momentarily. Return slowly, keeping pull steady, back to start. Then do same with a wider grip on the band (past shoulder width) Repeat _5-10__ times. Band color __yellow____   Side Pull: Double Arm   On back, knees bent, feet flat. Arms perpendicular to body, shoulder level, elbows straight but relaxed. Pull arms out to sides, elbows straight. Resistance band comes across collarbones, hands toward floor. Hold momentarily. Slowly return to starting position. Repeat _5-10__ times. Band color _yellow____   Sword   On back, knees bent, feet flat, left hand on left hip, right hand above left. Pull right arm DIAGONALLY (hip to shoulder) across chest. Bring right arm along head toward floor. Hold momentarily. Slowly return to starting position. Repeat _5-10__ times. Do with left arm. Band color _yellow_____   Shoulder Rotation: Double Arm   On back, knees bent, feet flat, elbows tucked at sides, bent 90, hands palms up. Pull hands apart and down toward floor, keeping elbows near sides. Hold momentarily. Slowly return to starting position. Repeat _5-10__ times. Band color __yellow____

## 2024-04-25 ENCOUNTER — Ambulatory Visit

## 2024-04-25 DIAGNOSIS — I890 Lymphedema, not elsewhere classified: Secondary | ICD-10-CM | POA: Diagnosis not present

## 2024-04-25 DIAGNOSIS — R293 Abnormal posture: Secondary | ICD-10-CM

## 2024-04-25 DIAGNOSIS — R600 Localized edema: Secondary | ICD-10-CM | POA: Diagnosis not present

## 2024-04-25 DIAGNOSIS — M25612 Stiffness of left shoulder, not elsewhere classified: Secondary | ICD-10-CM

## 2024-04-25 DIAGNOSIS — M25512 Pain in left shoulder: Secondary | ICD-10-CM | POA: Diagnosis not present

## 2024-04-25 NOTE — Therapy (Addendum)
 OUTPATIENT PHYSICAL THERAPY  UPPER EXTREMITY ONCOLOGY TREATMENT  Patient Name: [name] MRN: [id number] DOB:[birth year], 78 y.o., female Today's Date: 04/25/2024  END OF SESSION:  PT End of Session - 04/25/24 1451     Visit Number 17    Number of Visits 22    Authorization Type Medicare    PT Start Time 1455    PT Stop Time 1558    PT Time Calculation (min) 63 min    Activity Tolerance Patient tolerated treatment well    Behavior During Therapy WFL for tasks assessed/performed             Past Medical History:  Diagnosis Date   Arthritis    hands and knees   Complication of anesthesia    aspiration pna; following a colonoscopy, pt requests head of bed elevated if possible.   Constipation    Cough    Depression    Dysrhythmia    RBBB on 06-06-17 ekg    Elevated liver function tests    Esophageal stricture    Family history of breast cancer    Gallstones    Genetic testing 04/02/2018   STAT Breast panel with reflex to Multi-Cancer panel (83 genes) @ [facility] - No pathogenic mutations detected   GERD (gastroesophageal reflux disease)    Hiatal hernia    History of kidney stones    History of radiation therapy 07/09/2018- 08/03/18   Left Breast/ 40.05 Gy in 15 fractions. Left Breast boost 10 Gy in 5 fractions.    Hyperlipemia    Hypothyroidism    Insomnia    Malignant neoplasm of upper-outer quadrant of left female breast (HCC)    Nephrolithiasis    Personal history of radiation therapy    Pneumonia 2015   aspirated after colonosopy   Renal cyst    Past Surgical History:  Procedure Laterality Date   BREAST BIOPSY Left 05/31/2019   mri   BREAST BIOPSY Left 04/2019   malignant   BREAST LUMPECTOMY Left 04/17/2018   malignant   BREAST LUMPECTOMY WITH RADIOACTIVE SEED AND SENTINEL LYMPH NODE BIOPSY Left 04/17/2018   Procedure: LEFT BREAST LUMPECTOMY WITH BRACKETED RADIOACTIVE SEEDS AND SENTINEL LYMPH NODE BIOPSY;  Surgeon: Enid Harry, MD;  Location:  [facility];  Service: General;  Laterality: Left;   BREAST SURGERY Left    Lumpectomy   CARDIOVASCULAR STRESS TEST  07/19/2006   EF 70%, NO EVIDENCE OF ISCHEMIA   CHOLECYSTECTOMY  12/30/10   COLONOSCOPY     DILATION AND CURETTAGE OF UTERUS     HYSTEROSCOPY WITH D & C N/A 06/07/2018   Procedure: DILATATION AND CURETTAGE /HYSTEROSCOPY;  Surgeon: Thurman Flores, MD;  Location: [facility];  Service: Gynecology;  Laterality: N/A;   inguinal herniography     NASAL SINUS SURGERY     RADIOACTIVE SEED GUIDED EXCISIONAL BREAST BIOPSY Right 04/17/2018   Procedure: RIGHT BREAST SEED GUIDED EXCISIONAL BIOPSY;  Surgeon: Enid Harry, MD;  Location: [facility];  Service: General;  Laterality: Right;   RE-EXCISION OF BREAST LUMPECTOMY Left 05/15/2018   Procedure: RE-EXCISION OF LEFT BREAST LUMPECTOMY, ASPIRATION OF LEFT AXILLARY SEROMA;  Surgeon: Enid Harry, MD;  Location: [facility];  Service: General;  Laterality: Left;   T & A     TOOTH EXTRACTION     TOTAL KNEE ARTHROPLASTY Left 09/11/2017   Procedure: LEFT TOTAL KNEE ARTHROPLASTY;  Surgeon: Liliane Rei, MD;  Location: [facility];  Service: Orthopedics;  Laterality: Left;  with  block   US  ECHOCARDIOGRAPHY  07/17/2006   EF 55-60%   Patient Active Problem List   Diagnosis Date Noted   Other specified disorders of eustachian tube, right ear 11/15/2023   Chronic rhinitis 11/15/2023   Sensorineural hearing loss, bilateral 11/15/2023   Spinal stenosis of lumbar region with radiculopathy 10/12/2021   Chronic postoperative pain 10/12/2021   Breast mass, right 03/27/2020   Hepatic steatosis 01/22/2020   Genetic testing 04/02/2018   Family history of breast cancer    Malignant neoplasm of upper-outer quadrant of left breast in female, estrogen receptor positive (HCC) 03/16/2018   OA (osteoarthritis) of knee 09/11/2017   Infrapatellar bursitis of right knee 04/29/2014   Pneumonia, aspiration (HCC) 12/28/2012    HYPOTHYROIDISM 11/15/2010   HYPERLIPIDEMIA 11/15/2010   DEPRESSION 11/15/2010   GERD 11/15/2010   CONSTIPATION 11/15/2010   GALLSTONES 11/15/2010   RENAL CYST 11/15/2010   INSOMNIA UNSPECIFIED 11/15/2010   COUGH 11/15/2010   ABDOMINAL PAIN OTHER SPECIFIED SITE 11/15/2010   TRANSAMINASES, SERUM, ELEVATED 11/15/2010    REFERRING PROVIDER: Enid Harry, MD  REFERRING DIAG: Left arm arm swelling  THERAPY DIAG:  Abnormal posture  Left shoulder pain, unspecified chronicity  Lymphedema, not elsewhere classified  ONSET DATE: 2-3 months ago  Rationale for Evaluation and Treatment: Rehabilitation  SUBJECTIVE:                                                                                                                                                                                           SUBJECTIVE STATEMENT:   I stretched the gauntlet but it is still hurting my thumb a lot. Reminded pt that she was advised not to wear it if it is hurting. She will send it back   PERTINENT HISTORY:  04/17/2018 Bilateral lumpectomies with left SLNB with 0+/1 LN. Gr. 2 ILC Er/PR+, HER 2-, Ki 67 5%. Radiation 09/11/2018 -08/03/2018. Most recent prior PT treatment 04/05/2022 to 05/17/22 with this therapist. 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  PAIN:  Are you having pain? No, not today  PRECAUTIONS: Left TKA, sciatica, Lympedema(left), small brain aneurysm  RED FLAGS: None   WEIGHT BEARING RESTRICTIONS: No  FALLS:  Has patient fallen in last 6 months? No  LIVING ENVIRONMENT: Lives with: lives with their spouse Lives in: House/apartment OCCUPATION: retired   LEISURE: reading , gardening  HAND DOMINANCE: right   PRIOR LEVEL OF FUNCTION: Independent  PATIENT GOALS: ease the pain, move better, check swelling   OBJECTIVE: Note: Objective measures were completed at Evaluation unless otherwise noted.  COGNITION: Overall cognitive status: Within functional limits for tasks  assessed   PALPATION: Tender at  proximal biceps, supraspinatus, mid deltoid,tender left pectorals, breast incision on left.  OBSERVATIONS / OTHER ASSESSMENTS: positive left impingement, positive empty  SENSATION: Light touch: Appears intact   POSTURE: forward head, rounded shoulders  UPPER EXTREMITY AROM/PROM:  A/PROM RIGHT   eval   Shoulder extension 65  Shoulder flexion 120  Shoulder abduction 164, slight scaption  Shoulder internal rotation 75  Shoulder external rotation 90    (Blank rows = not tested)  A/PROM LEFT   eval  Shoulder extension 54, tight  Shoulder flexion 120, tight , mild pain at top of shoulder  Shoulder abduction 90  Shoulder internal rotation   Shoulder external rotation     (Blank rows = not tested)  CERVICAL AROM:  within functional limits:     UPPER EXTREMITY STRENGTH: functional but limited by pain on left  LYMPHEDEMA ASSESSMENTS:   SURGERY TYPE/DATE: 04/17/2018 Bilateral lumpectomies with Left SLNB, had a seroma on left  NUMBER OF LYMPH NODES REMOVED: 0+/1  CHEMOTHERAPY: NO  RADIATION:Yes   HORMONE TREATMENT: end 07/2018  INFECTIONS: NO   LYMPHEDEMA ASSESSMENTS:   LANDMARK RIGHT  eval  At axilla  33.0  15 cm proximal to olecranon process 32.3  10 cm proximal to olecranon process 30.8  Olecranon process 25.7  15 cm proximal to ulnar styloid process 23.3  10 cm proximal to ulnar styloid process 20.1  Just proximal to ulnar styloid process 14.2  Across hand at thumb web space 18.0  At base of 2nd digit 6.2  (Blank rows = not tested)  LANDMARK LEFT  eval Left 03/15/24 LEFT 03/20/2024 LEFT 03/22/2024 Left 04/03/24 LEFT 04/22/2024  At axilla  33.7  32.3  31.7 32.9  15 cm proximal to olecranon process 33.5  33.3  30.7 30.9  10 cm proximal to olecranon process 32.3  31.9 30.7 29.6 29.8  Olecranon process 26.8  26.7 26.2 25.4 25.4  15 cm proximal to ulnar styloid process 23.3  23.5 23.5 23.5 22.9  10 cm proximal to ulnar styloid  process 20.0  20.1 20.1 20.4 20.3  Just proximal to ulnar styloid process 15.25 15.2 15.2 15.0 14.8 14.9  Across hand at thumb web space 18.4  17.8  16.7 17.8  At base of 2nd digit 6.1  6.0 5.9 5.8 5.8  (Blank rows = not tested)   FUNCTIONAL TESTS:    GAIT: flexed posture,increased kyphosis, independent     QUICK DASH SURVEY: 40.91 EVAL, 04/22/2024 43.18                                                                                                                            TREATMENT DATE:  04/25/2024 Reminded pt to send gantlet back and wrote down company name for her to call Had pt try on size 2 Medi Harmony gauntlet;too small, and size 3;seemed to fit well but her thumb was already sore from the [employer]. May be worth trying again to see if its a good fit.  Also discussed having her go to [facility] to try on different gauntlets and can purchase there or we will purchase for her if she gets name and size. Told her [employer] will be calling to set up Flexi touch demo;gave her pamphlet on Flexi touch. She would like to do it at home so her husband is there. Perfomed standing postural exercises; Scapular retraction, shoulder extension and bilateral ER all x 10 with yellow. Only occasional VC's needed Gave longer band so she can make a knot and place in door or use her stairway. Supine horizontal abd with yellow x 10. VC's and visual cues to exten all the way across chest with band. STM with cocoa butter to left UT, pecs, and lateral trunk . PROM to left shoulder flex, scaption, abd, ER. MLDShort neck, superficial and deep abdominals, R axillary nodes and establishment of interaxillary pathway, L inguinal nodes and establishment of axilloinguinal pathway, then L UE working proximal to distal, moving fluid from upper inner arm outwards, and doing both sides of forearm, and hand moving fluid towards pathways spending extra time in any areas of fibrosis then retracing all  steps  Had pt use arm butler for donning her sleeve, and with min assist got on easily and all the way up.    04/22/2024 Pt remeasured for Recert Reviewed goals with pt. For recert At pt request called [employer] to advise pt proper way to make return with the leg donning butler and order the arm butler, We tried having pt use the leg butler to don her sleeve, but she had difficulty and wants the proper arm butler Then used [employer] to order the arm Randal Bury for pt to be delivered to her home. [employer] emailed pt how to make return of the other item. Checked gauntlet large at wrist and small at thumb. Pt will try stretching thumb with high lighter overnight. Her hand is not swelling with sleeve on. Advised she should not continue to wear the gauntlet if it is too tight on thumb or causing numbness or pain. Also discussed exercises and she indicates she is still doing band exercises that cause shoulder pain. Pt does have some memory deficits and was advised again to discontinue supine exercises and do standing ones only. Did not have time to review self MLD, although pt feels she is doing well.  04/18/2024  scapular retraction, shoulder extension and bilateral ER using good posture x 10 with yellow band.  Also performed supine horizontal abduction with yellow x 10. Pt advised to discontinue supine exercises that involve going overhead. May do horizontal abd only. Pt in sitting to practice MLD with therapist demonstrating and pt return demonstrating.  Short neck, 5 diaphragmatic breaths, R axillary nodes and establishment of interaxillary pathway, L inguinal nodes and establishment of axilloinguinal pathway, then L UE working proximal to distal, moving fluid from upper inner arm outwards, and doing both sides of forearm moving fluid towards pathways spending extra time in any areas of fibrosis then retracing all steps  and ending with LN's. Pt practiced each step after watching therapist. She  required Max VC's and tactile cues initially to get proper stretch, but did exceptionally well after practicing with a more relaxed hand. Written handout for MLD given to pt. Pt has still not received gauntlet or night garment but [employer] did message to say it is with insurance. She has not yet heard from [employer]. Will wait until Monday before ordering garments to see if  her gauntlet arrives over the weekend. Measure next, review MLD, Recert.  04/15/2024 Educated pt in standing theraband exercises including scapular retraction, shoulder extension and bilateral ER using good posture x 10 and updated HEP. Also performed supine horizontal abduction with yellow x 10. Discussed garments and pt would like 2 more sleeves, another gauntlet and a glove. Faxed script to Dr. Delane Fear STM to Lt axilla at pect insertionT, then to lateral trunk where still tight with cocoa butter PROM to left shoulder flexion, scaption, abduction and ER with VC's prn to relax In supine: Short neck, superficial and deep abdominals, R axillary nodes and establishment of interaxillary pathway, L inguinal nodes and establishment of axilloinguinal pathway, then L UE working proximal to distal, moving fluid from upper inner arm outwards, and doing both sides of forearm, and hand moving fluid towards pathways spending extra time in any areas of fibrosis then retracing all steps  Had pt use arm butler for donning her sleeve and used gloves to assist  Did very well overall.. Discussed teaching pt MLD in sitting next visit. 04/12/24: Self Care Showed pt 2 donning butler options: slippie gator and Medi donning butler . Then got our Medi butler for pt to try using this. She preferred this option so assisted her with ordering this from [employer]. Therapeutic Activities Postural strength with supine scapular series with yellow therband as follows: Bil Horz abd, bil UE er, and narrow and wide grips flex x 10 each returning therapist demo, then D2 x  5 each UE returning demo and tactile cues for correct technique Manual Therapy MLD to Lt UE: Short neck, superficial and deep abdominals, Lt inguinal nodes and Lt axillo-inguinal anastomosis, Rt axillary and pectoral nodes, anterior intact thorax sequence, and then anterior inter-axillary anastomosis, then Lt UE working from proximal to distal and retracing all steps back to anastomosis and ending with LN's.  P/ROM to Lt shoulder into flex, abd and D2 with scapular depression by therapist throughout; end motions are improved today STM to Lt axilla at pect insertion that conts to tight but softened some during MT, then to lateral trunk where still tight but much less so Donned pts compression sleeve after manual therapy        PATIENT EDUCATION:  04/15/2024 Standing Scapular retraction, bilateral shoulder ext and bilateral ER with yellow x 10. Emphasis on proper posture Education details: Supine scapular series Person educated: Patient Education method: Explanation, demonstration, tactile and VC's and handout issued Education comprehension: verbalized understanding, returned demo, tactile cues and pt will benefit from further review  HOME EXERCISE PROGRAM: Supine scapular series with yellow theraband, standing postureal exercises;SR, shoulder ext, bilateral eR Discontinue Supine exs except horizontal abd due to pain ASSESSMENT:  CLINICAL IMPRESSION:  Despite pt stretching the thumb on her gauntlet it is too tight on her thumb and needs to be returned. Advised to call [employer] to do this tomorrow.  Did well with band exercises today and remembered not to do overhead exercises at home. Still tight and tender at axillary border of pectorals. Pts has had greater than 4 weeks of conservative therapy including compression, elevation and exercise with therapy starting on 03/11/2024 and will be completed soon when she has her appropriate compression garments. Despite improvements with CDT she  continues with swelling that needs continual management. She will benefit from a Flexi touch to assist  pt with MLD as she has shoulder pain with extensive reaching making self MLD difficult at times.  DIAGNOSIS ASSESSMENT [x]  Lymphedema, not  elsewhere classified [I89.0]  Secondary to:  []  Post-mastectomy lymphedema [I97.2]   []  Hereditary/Congenital lymphedema [Q82.0]  []  Praecox []  Tarda []  Other:   LOCATION OF LYMPHEDEMA []   Right Arm  [x]  Left Arm  [x]  Chest/Breast  []  Abdomen/Trunk []  Other: Date of onset, or duration: Jan 2025  SEVERITY Symptoms and observations from physical exam Stage of Lymphedema:  [x] Stage II  []  Stage III  [] Other: [x]  Hyperkeratosis []  Hyperpigmentation []  Lymphorrhea (weeping) []  Papillomatosis (warts, nodules, papules)  []  Elephantiasis  []  Fibrosis and/or radiation fibrosis []  Pitting edema []  Recurrent episode(s) of infection/cellulitis []  Pain []  Peau d'orange [x]  Chest and/or axillary swelling caused by: [x]  History of chest/breast/abdominal surgery(ies) [x]  Lymph node dissection, radiation and or chemotherapy []  Truncal and/or abdominal swelling []  Axillary cording []  Other, explain:  CONSERVATIVE TREATMENT (CT) INITATION Start date (if different from this visit date):03/11/2024  Conservative treatment plan includes the following activities (check all that apply): [x]  Regular exercise  [x]  Elevation of the limb(s) [x]  Compression garment(s) or compression bandage system  []  Compression garments with a minimum of             mmHg distally  [x]  Compression bra and/or tank top [x]  Compression bandage system  Type: [x]  Multi-layer short stretch []  Velcro reduction kit    []  Two- or three-layer prepackaged compression kit   []  Other:  [x]  Complete decongestive therapy (CDT) or lymphedema therapy   []  Minimum of 30 minutes daily MLD or self-MLD  []  Diet assessment with modification, if applicable. []  For patients with congestive heart  failure (CHF) a medication assessment with modification(s), if applicable. []  Other:   Has the patient tried an [id number] basic pneumatic compression device? []  Yes  [x]  No Comments:   MEASUREMENTS: see documentation in notes       OBJECTIVE IMPAIRMENTS: decreased activity tolerance, decreased knowledge of condition, decreased ROM, decreased strength, increased edema, impaired UE functional use, postural dysfunction, and pain.   ACTIVITY LIMITATIONS: lifting, sleeping, dressing, and reach over head  PARTICIPATION LIMITATIONS: cleaning and laundry  PERSONAL FACTORS:  Left breast Cancer, radiation are also patient's functional outcome.   REHAB POTENTIAL: Good  CLINICAL DECISION MAKING: Evolving/moderate complexity  EVALUATION COMPLEXITY: Moderate  GOALS: Goals reviewed with patient? Yes  SHORT TERM GOALS: Target date: 04/01/2024  Pt will be independent and compliant with HEP for left shoulder ROM/strength  Baseline: Goal status: MET  2.  Pt will be fit for appropriate flat knit compression sleeve to address left UE lymphedema Baseline:  Goal status: MET 04/08/2024 3.  Pt will have decreased left shoulder/axillary pain by 25% or more Baseline:  Goal status: MET 4.  Quick dash will improve to greater than 30% to demonstrate improved function Baseline:  Goal status:In Progress; slightly worse today  LONG TERM GOALS: Target date: 04/22/2024  Pt will have decreased complaints left shoulder/axillary pain  by 50%  Baseline:  Goal status: In Progress 2.  Pt will have left shoulder ROM WNL as compared to right UE   Baseline:  Goal status: In Progress 3.  Pts quick dash will be no greater than 22% to demonstrate improved function in left shoulder Baseline:  Goal status: In Progress  4.  Pt will be educated in self MLD to decrease left UE swelling prn Baseline:  Goal status: In Progress, doing at home but needs 1 session to review  PLAN:  PT FREQUENCY: 2x/week  PT  DURATION: 3  weeks  PLANNED INTERVENTIONS: 97164- PT Re-evaluation, 97110-Therapeutic exercises,  16109- Therapeutic activity, [id number]- Neuromuscular re-education, [phone]- Self Care, 09811- Manual therapy, [phone]- Orthotic Fit/training, Patient/Family education, and Joint mobilization  PLAN FOR NEXT SESSION, Try size 3 Harmony gauntlet next visit to see if comfortable on thumb then we may be able to order her sleeves and gauntlet together, When ready  assist pt with ordering more sleeves and gauntlet (maybe one glove?), Review  Self MLD in sitting next,Cont STM left UT, axillary border of pectorals, Review standing Band exs prn  04/03/24 - Faxed demographics and PT note to [employer] and [employer]. Also faxed script to Dr .Delane Fear for the nighttime garment. VR 04/15/2024; messaged Leah re; flexi, Faxed script to Dr. Delane Fear for garments to order through insurance Latisha Poland, PT 04/25/2024, 4:14 PM  Over Head Pull: Narrow and Wide Grip   Cancer Rehab [phone]   On back, knees bent, feet flat, band across thighs, elbows straight but relaxed. Pull hands apart (start). Keeping elbows straight, bring arms up and over head, hands toward floor. Keep pull steady on band. Hold momentarily. Return slowly, keeping pull steady, back to start. Then do same with a wider grip on the band (past shoulder width) Repeat _5-10__ times. Band color __yellow____   Side Pull: Double Arm   On back, knees bent, feet flat. Arms perpendicular to body, shoulder level, elbows straight but relaxed. Pull arms out to sides, elbows straight. Resistance band comes across collarbones, hands toward floor. Hold momentarily. Slowly return to starting position. Repeat _5-10__ times. Band color _yellow____   Sword   On back, knees bent, feet flat, left hand on left hip, right hand above left. Pull right arm DIAGONALLY (hip to shoulder) across chest. Bring right arm along head toward floor. Hold momentarily. Slowly return  to starting position. Repeat _5-10__ times. Do with left arm. Band color _yellow_____   Shoulder Rotation: Double Arm   On back, knees bent, feet flat, elbows tucked at sides, bent 90, hands palms up. Pull hands apart and down toward floor, keeping elbows near sides. Hold momentarily. Slowly return to starting position. Repeat _5-10__ times. Band color __yellow____

## 2024-04-29 ENCOUNTER — Ambulatory Visit

## 2024-04-29 DIAGNOSIS — I890 Lymphedema, not elsewhere classified: Secondary | ICD-10-CM | POA: Diagnosis not present

## 2024-04-29 DIAGNOSIS — R600 Localized edema: Secondary | ICD-10-CM | POA: Diagnosis not present

## 2024-04-29 DIAGNOSIS — R293 Abnormal posture: Secondary | ICD-10-CM | POA: Diagnosis not present

## 2024-04-29 DIAGNOSIS — M25512 Pain in left shoulder: Secondary | ICD-10-CM | POA: Diagnosis not present

## 2024-04-29 DIAGNOSIS — M25612 Stiffness of left shoulder, not elsewhere classified: Secondary | ICD-10-CM | POA: Diagnosis not present

## 2024-04-29 NOTE — Therapy (Signed)
 OUTPATIENT PHYSICAL THERAPY  UPPER EXTREMITY ONCOLOGY TREATMENT  Patient Name: [name] MRN: [id number] DOB:1946/08/15, 78 y.o., female Today's Date: 04/29/2024  END OF SESSION:  PT End of Session - 04/29/24 1111     Visit Number 18    Number of Visits 22    Date for PT Re-Evaluation 05/13/24    Authorization Type Medicare    PT Start Time 1103    PT Stop Time 1201    PT Time Calculation (min) 58 min    Activity Tolerance Patient tolerated treatment well    Behavior During Therapy WFL for tasks assessed/performed             Past Medical History:  Diagnosis Date   Arthritis    hands and knees   Complication of anesthesia    aspiration pna; following a colonoscopy, pt requests head of bed elevated if possible.   Constipation    Cough    Depression    Dysrhythmia    RBBB on 06-06-17 ekg    Elevated liver function tests    Esophageal stricture    Family history of breast cancer    Gallstones    Genetic testing 04/02/2018   STAT Breast panel with reflex to Multi-Cancer panel (83 genes) @ [facility] - No pathogenic mutations detected   GERD (gastroesophageal reflux disease)    Hiatal hernia    History of kidney stones    History of radiation therapy 07/09/2018- 08/03/18   Left Breast/ 40.05 Gy in 15 fractions. Left Breast boost 10 Gy in 5 fractions.    Hyperlipemia    Hypothyroidism    Insomnia    Malignant neoplasm of upper-outer quadrant of left female breast (HCC)    Nephrolithiasis    Personal history of radiation therapy    Pneumonia 2015   aspirated after colonosopy   Renal cyst    Past Surgical History:  Procedure Laterality Date   BREAST BIOPSY Left 05/31/2019   mri   BREAST BIOPSY Left 04/2019   malignant   BREAST LUMPECTOMY Left 04/17/2018   malignant   BREAST LUMPECTOMY WITH RADIOACTIVE SEED AND SENTINEL LYMPH NODE BIOPSY Left 04/17/2018   Procedure: LEFT BREAST LUMPECTOMY WITH BRACKETED RADIOACTIVE SEEDS AND SENTINEL LYMPH NODE BIOPSY;   Surgeon: Enid Harry, MD;  Location: [facility];  Service: General;  Laterality: Left;   BREAST SURGERY Left    Lumpectomy   CARDIOVASCULAR STRESS TEST  07/19/2006   EF 70%, NO EVIDENCE OF ISCHEMIA   CHOLECYSTECTOMY  12/30/10   COLONOSCOPY     DILATION AND CURETTAGE OF UTERUS     HYSTEROSCOPY WITH D & C N/A 06/07/2018   Procedure: DILATATION AND CURETTAGE /HYSTEROSCOPY;  Surgeon: Thurman Flores, MD;  Location: [facility];  Service: Gynecology;  Laterality: N/A;   inguinal herniography     NASAL SINUS SURGERY     RADIOACTIVE SEED GUIDED EXCISIONAL BREAST BIOPSY Right 04/17/2018   Procedure: RIGHT BREAST SEED GUIDED EXCISIONAL BIOPSY;  Surgeon: Enid Harry, MD;  Location: [facility];  Service: General;  Laterality: Right;   RE-EXCISION OF BREAST LUMPECTOMY Left 05/15/2018   Procedure: RE-EXCISION OF LEFT BREAST LUMPECTOMY, ASPIRATION OF LEFT AXILLARY SEROMA;  Surgeon: Enid Harry, MD;  Location: [facility];  Service: General;  Laterality: Left;   T & A     TOOTH EXTRACTION     TOTAL KNEE ARTHROPLASTY Left 09/11/2017   Procedure: LEFT TOTAL KNEE ARTHROPLASTY;  Surgeon: Liliane Rei, MD;  Location: [facility];  Service: Orthopedics;  Laterality: Left;  with block   US  ECHOCARDIOGRAPHY  07/17/2006   EF 55-60%   Patient Active Problem List   Diagnosis Date Noted   Other specified disorders of eustachian tube, right ear 11/15/2023   Chronic rhinitis 11/15/2023   Sensorineural hearing loss, bilateral 11/15/2023   Spinal stenosis of lumbar region with radiculopathy 10/12/2021   Chronic postoperative pain 10/12/2021   Breast mass, right 03/27/2020   Hepatic steatosis 01/22/2020   Genetic testing 04/02/2018   Family history of breast cancer    Malignant neoplasm of upper-outer quadrant of left breast in female, estrogen receptor positive (HCC) 03/16/2018   OA (osteoarthritis) of knee 09/11/2017   Infrapatellar bursitis of right knee  04/29/2014   Pneumonia, aspiration (HCC) 12/28/2012   HYPOTHYROIDISM 11/15/2010   HYPERLIPIDEMIA 11/15/2010   DEPRESSION 11/15/2010   GERD 11/15/2010   CONSTIPATION 11/15/2010   GALLSTONES 11/15/2010   RENAL CYST 11/15/2010   INSOMNIA UNSPECIFIED 11/15/2010   COUGH 11/15/2010   ABDOMINAL PAIN OTHER SPECIFIED SITE 11/15/2010   TRANSAMINASES, SERUM, ELEVATED 11/15/2010    REFERRING PROVIDER: Enid Harry, MD  REFERRING DIAG: Left arm arm swelling  THERAPY DIAG:  Abnormal posture  Left shoulder pain, unspecified chronicity  Lymphedema, not elsewhere classified  ONSET DATE: 2-3 months ago  Rationale for Evaluation and Treatment: Rehabilitation  SUBJECTIVE:                                                                                                                                                                                           SUBJECTIVE STATEMENT:  I'm going to send the gauntlet back as soon as I call compression guru and get the info. My Lt shoulder has been hurting all weekend since we did the theraband exs last time (supine  series and then standing rows and bil UE ext). I just feel like the ROM was more than I've been doing.    PERTINENT HISTORY:  04/17/2018 Bilateral lumpectomies with left SLNB with 0+/1 LN. Gr. 2 ILC Er/PR+, HER 2-, Ki 67 5%. Radiation 09/11/2018 -08/03/2018. Most recent prior PT treatment 04/05/2022 to 05/17/22 with this therapist. 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  PAIN:  PAIN:  Are you having pain? Yes NPRS scale: 6/10 Pain location: anterior shoulder/upper arm Pain orientation: Left and Anterior  PAIN TYPE: throbbing and sore Pain description: constant  Aggravating factors: exercises last time and increased ROM Relieving factors: rest and gentle stretching   PRECAUTIONS: Left TKA, sciatica, Lympedema(left), small brain aneurysm  RED FLAGS: None   WEIGHT BEARING RESTRICTIONS: No  FALLS:  Has patient fallen in last 6 months?  No  LIVING ENVIRONMENT: Lives with: lives with their spouse Lives in: House/apartment OCCUPATION: retired   LEISURE: reading , gardening  HAND DOMINANCE: right   PRIOR LEVEL OF FUNCTION: Independent  PATIENT GOALS: ease the pain, move better, check swelling   OBJECTIVE: Note: Objective measures were completed at Evaluation unless otherwise noted.  COGNITION: Overall cognitive status: Within functional limits for tasks assessed   PALPATION: Tender at proximal biceps, supraspinatus, mid deltoid,tender left pectorals, breast incision on left.  OBSERVATIONS / OTHER ASSESSMENTS: positive left impingement, positive empty  SENSATION: Light touch: Appears intact   POSTURE: forward head, rounded shoulders  UPPER EXTREMITY AROM/PROM:  A/PROM RIGHT   eval   Shoulder extension 65  Shoulder flexion 120  Shoulder abduction 164, slight scaption  Shoulder internal rotation 75  Shoulder external rotation 90    (Blank rows = not tested)  A/PROM LEFT   eval  Shoulder extension 54, tight  Shoulder flexion 120, tight , mild pain at top of shoulder  Shoulder abduction 90  Shoulder internal rotation   Shoulder external rotation     (Blank rows = not tested)  CERVICAL AROM:  within functional limits:     UPPER EXTREMITY STRENGTH: functional but limited by pain on left  LYMPHEDEMA ASSESSMENTS:   SURGERY TYPE/DATE: 04/17/2018 Bilateral lumpectomies with Left SLNB, had a seroma on left  NUMBER OF LYMPH NODES REMOVED: 0+/1  CHEMOTHERAPY: NO  RADIATION:Yes   HORMONE TREATMENT: end 07/2018  INFECTIONS: NO   LYMPHEDEMA ASSESSMENTS:   LANDMARK RIGHT  eval  At axilla  33.0  15 cm proximal to olecranon process 32.3  10 cm proximal to olecranon process 30.8  Olecranon process 25.7  15 cm proximal to ulnar styloid process 23.3  10 cm proximal to ulnar styloid process 20.1  Just proximal to ulnar styloid process 14.2  Across hand at thumb web space 18.0  At base of 2nd  digit 6.2  (Blank rows = not tested)  LANDMARK LEFT  eval Left 03/15/24 LEFT 03/20/2024 LEFT 03/22/2024 Left 04/03/24 LEFT 04/22/2024  At axilla  33.7  32.3  31.7 32.9  15 cm proximal to olecranon process 33.5  33.3  30.7 30.9  10 cm proximal to olecranon process 32.3  31.9 30.7 29.6 29.8  Olecranon process 26.8  26.7 26.2 25.4 25.4  15 cm proximal to ulnar styloid process 23.3  23.5 23.5 23.5 22.9  10 cm proximal to ulnar styloid process 20.0  20.1 20.1 20.4 20.3  Just proximal to ulnar styloid process 15.25 15.2 15.2 15.0 14.8 14.9  Across hand at thumb web space 18.4  17.8  16.7 17.8  At base of 2nd digit 6.1  6.0 5.9 5.8 5.8  (Blank rows = not tested)   FUNCTIONAL TESTS:    GAIT: flexed posture,increased kyphosis, independent     QUICK DASH SURVEY: 40.91 EVAL, 04/22/2024 43.18  TREATMENT DATE:  04/29/24: Manual Therapy In supine: Short neck, superficial and deep abdominals, Rt axillary and pectoral nodes, intact anterior thorax sequence and establishment of anterior interaxillary pathway, Lt inguinal nodes and establishment of Lt axilloinguinal pathway, then L UE working proximal to distal, moving fluid from upper inner arm outwards, and doing both sides of forearm, and hand moving fluid towards pathways spending extra time in any areas of fibrosis then retracing all steps  PROM to left shoulder flexion, scaption, abduction, D2 and ER with VC's prn to relax STM to Lt axilla at pect insertion and to bicep tendon where pt reports most pain today  04/25/2024 Reminded pt to send gantlet back and wrote down company name for her to call Had pt try on size 2 [employer] gauntlet;too small, and size 3;seemed to fit well but her thumb was already sore from the [employer]. May be worth trying again to see if its a good fit. Also discussed having her go to [facility] to try on different gauntlets and can purchase there or we will purchase for her if she gets name and size. Told her [employer] will be calling to set up Flexi touch demo;gave her pamphlet on Flexi touch. She would like to do it at home so her husband is there. Perfomed standing postural exercises; Scapular retraction, shoulder extension and bilateral ER all x 10 with yellow. Only occasional VC's needed Gave longer band so she can make a knot and place in door or use her stairway. Supine horizontal abd with yellow x 10. VC's and visual cues to exten all the way across chest with band. STM with cocoa butter to left UT, pecs, and lateral trunk . PROM to left shoulder flex, scaption, abd, ER. MLDShort neck, superficial and deep abdominals, R axillary nodes and establishment of interaxillary pathway, L inguinal nodes and establishment of axilloinguinal pathway, then L UE working proximal to distal, moving fluid from upper inner arm outwards, and doing both sides of forearm, and hand moving fluid towards pathways spending extra time in any areas of fibrosis then retracing all steps  Had pt use arm butler for donning her sleeve, and with min assist got on easily and all the way up.    04/22/2024 Pt remeasured for Recert Reviewed goals with pt. For recert At pt request called [employer] to advise pt proper way to make return with the leg donning butler and order the arm butler, We tried having pt use the leg butler to don her sleeve, but she had difficulty and wants the proper arm butler Then used [employer] to order the arm Randal Bury for pt to be delivered to her home. [employer] guru emailed pt how to make return of the other item. Checked gauntlet large at wrist and small at thumb. Pt will try stretching thumb with high lighter overnight. Her hand is not swelling with sleeve on. Advised she should not continue to wear the gauntlet if it is too tight on thumb or causing numbness or  pain. Also discussed exercises and she indicates she is still doing band exercises that cause shoulder pain. Pt does have some memory deficits and was advised again to discontinue supine exercises and do standing ones only. Did not have time to review self MLD, although pt feels she is doing well.  04/18/2024  scapular retraction, shoulder extension and bilateral ER using good posture x 10 with yellow band.  Also performed supine horizontal abduction with yellow x 10. Pt  advised to discontinue supine exercises that involve going overhead. May do horizontal abd only. Pt in sitting to practice MLD with therapist demonstrating and pt return demonstrating.  Short neck, 5 diaphragmatic breaths, R axillary nodes and establishment of interaxillary pathway, L inguinal nodes and establishment of axilloinguinal pathway, then L UE working proximal to distal, moving fluid from upper inner arm outwards, and doing both sides of forearm moving fluid towards pathways spending extra time in any areas of fibrosis then retracing all steps  and ending with LN's. Pt practiced each step after watching therapist. She required Max VC's and tactile cues initially to get proper stretch, but did exceptionally well after practicing with a more relaxed hand. Written handout for MLD given to pt. Pt has still not received gauntlet or night garment but [employer] did message to say it is with insurance. She has not yet heard from [employer]. Will wait until Monday before ordering garments to see if her gauntlet arrives over the weekend. Measure next, review MLD, Recert.  04/15/2024 Educated pt in standing theraband exercises including scapular retraction, shoulder extension and bilateral ER using good posture x 10 and updated HEP. Also performed supine horizontal abduction with yellow x 10. Discussed garments and pt would like 2 more sleeves, another gauntlet and a glove. Faxed script to Dr. Delane Fear STM to Lt axilla at pect insertionT, then  to lateral trunk where still tight with cocoa butter PROM to left shoulder flexion, scaption, abduction and ER with VC's prn to relax In supine: Short neck, superficial and deep abdominals, R axillary nodes and establishment of interaxillary pathway, L inguinal nodes and establishment of axilloinguinal pathway, then L UE working proximal to distal, moving fluid from upper inner arm outwards, and doing both sides of forearm, and hand moving fluid towards pathways spending extra time in any areas of fibrosis then retracing all steps  Had pt use arm butler for donning her sleeve and used gloves to assist  Did very well overall.. Discussed teaching pt MLD in sitting next visit.        PATIENT EDUCATION:  04/15/2024 Standing Scapular retraction, bilateral shoulder ext and bilateral ER with yellow x 10. Emphasis on proper posture Education details: Supine scapular series Person educated: Patient Education method: Explanation, demonstration, tactile and VC's and handout issued Education comprehension: verbalized understanding, returned demo, tactile cues and pt will benefit from further review  HOME EXERCISE PROGRAM: Supine scapular series with yellow theraband, standing postureal exercises;SR, shoulder ext, bilateral eR Discontinue Supine exs except horizontal abd due to pain ASSESSMENT:  CLINICAL IMPRESSION: Pt has not called [employer] yet for instructions to return gauntlet but reports she has it boxed up and ready to go so will call soon. She was in a lot of pain since last session so continued with focus on manual therapy for Lt shoulder and verbally reviewed MLD while performing today.    OBJECTIVE IMPAIRMENTS: decreased activity tolerance, decreased knowledge of condition, decreased ROM, decreased strength, increased edema, impaired UE functional use, postural dysfunction, and pain.   ACTIVITY LIMITATIONS: lifting, sleeping, dressing, and reach over head  PARTICIPATION  LIMITATIONS: cleaning and laundry  PERSONAL FACTORS:  Left breast Cancer, radiation are also patient's functional outcome.   REHAB POTENTIAL: Good  CLINICAL DECISION MAKING: Evolving/moderate complexity  EVALUATION COMPLEXITY: Moderate  GOALS: Goals reviewed with patient? Yes  SHORT TERM GOALS: Target date: 04/01/2024  Pt will be independent and compliant with HEP for left shoulder ROM/strength  Baseline: Goal status: MET  2.  Pt will be fit for appropriate flat knit compression sleeve to address left UE lymphedema Baseline:  Goal status: MET 04/08/2024 3.  Pt will have decreased left shoulder/axillary pain by 25% or more Baseline:  Goal status: MET 4.  Quick dash will improve to greater than 30% to demonstrate improved function Baseline:  Goal status:In Progress; slightly worse today  LONG TERM GOALS: Target date: 04/22/2024  Pt will have decreased complaints left shoulder/axillary pain  by 50%  Baseline:  Goal status: In Progress 2.  Pt will have left shoulder ROM WNL as compared to right UE   Baseline:  Goal status: In Progress 3.  Pts quick dash will be no greater than 22% to demonstrate improved function in left shoulder Baseline:  Goal status: In Progress  4.  Pt will be educated in self MLD to decrease left UE swelling prn Baseline:  Goal status: In Progress, doing at home but needs 1 session to review  PLAN:  PT FREQUENCY: 2x/week  PT DURATION: 3  weeks  PLANNED INTERVENTIONS: 97164- PT Re-evaluation, 97110-Therapeutic exercises, 97530- Therapeutic activity, 97112- Neuromuscular re-education, 97535- Self Care, 44010- Manual therapy, [phone]- Orthotic Fit/training, Patient/Family education, and Joint mobilization  PLAN FOR NEXT SESSION, Try size 3 Harmony gauntlet next visit to see if comfortable on thumb then we may be able to order her sleeves and gauntlet together, When ready assist pt with ordering more sleeves and gauntlet (maybe one glove?), Review  Self  MLD in sitting next,Cont STM left UT, axillary border of pectorals, Review standing Band exs prn  04/03/24 - Faxed demographics and PT note to [employer] and [employer] fit. Also faxed script to Dr .Delane Fear for the nighttime garment. VR 04/15/2024; messaged Leah re; flexi, Faxed script to Dr. Delane Fear for garments to order through insurance Denyce Flank, PTA 04/29/2024, 1:39 PM  Over Head Pull: Narrow and Wide Grip   Cancer Re[facility] [phone]   On back, knees bent, feet flat, band across thighs, elbows straight but relaxed. Pull hands apart (start). Keeping elbows straight, bring arms up and over head, hands toward floor. Keep pull steady on band. Hold momentarily. Return slowly, keeping pull steady, back to start. Then do same with a wider grip on the band (past shoulder width) Repeat _5-10__ times. Band color __yellow____   Side Pull: Double Arm   On back, knees bent, feet flat. Arms perpendicular to body, shoulder level, elbows straight but relaxed. Pull arms out to sides, elbows straight. Resistance band comes across collarbones, hands toward floor. Hold momentarily. Slowly return to starting position. Repeat _5-10__ times. Band color _yellow____   Sword   On back, knees bent, feet flat, left hand on left hip, right hand above left. Pull right arm DIAGONALLY (hip to shoulder) across chest. Bring right arm along head toward floor. Hold momentarily. Slowly return to starting position. Repeat _5-10__ times. Do with left arm. Band color _yellow_____   Shoulder Rotation: Double Arm   On back, knees bent, feet flat, elbows tucked at sides, bent 90, hands palms up. Pull hands apart and down toward floor, keeping elbows near sides. Hold momentarily. Slowly return to starting position. Repeat _5-10__ times. Band color __yellow____

## 2024-05-02 ENCOUNTER — Ambulatory Visit

## 2024-05-02 DIAGNOSIS — I890 Lymphedema, not elsewhere classified: Secondary | ICD-10-CM | POA: Diagnosis not present

## 2024-05-02 DIAGNOSIS — R293 Abnormal posture: Secondary | ICD-10-CM

## 2024-05-02 DIAGNOSIS — M25612 Stiffness of left shoulder, not elsewhere classified: Secondary | ICD-10-CM | POA: Diagnosis not present

## 2024-05-02 DIAGNOSIS — R600 Localized edema: Secondary | ICD-10-CM | POA: Diagnosis not present

## 2024-05-02 DIAGNOSIS — M25512 Pain in left shoulder: Secondary | ICD-10-CM

## 2024-05-02 NOTE — Therapy (Signed)
 OUTPATIENT PHYSICAL THERAPY  UPPER EXTREMITY ONCOLOGY TREATMENT  Patient Name: [name] MRN: [id number] DOB:28-Jun-1946, 78 y.o., female Today's Date: 05/02/2024  END OF SESSION:  PT End of Session - 05/02/24 1012     Visit Number 19    Number of Visits 22    Date for PT Re-Evaluation 05/13/24    Authorization Type Medicare    PT Start Time 1005    PT Stop Time 1100    PT Time Calculation (min) 55 min    Activity Tolerance Patient tolerated treatment well    Behavior During Therapy WFL for tasks assessed/performed             Past Medical History:  Diagnosis Date   Arthritis    hands and knees   Complication of anesthesia    aspiration pna; following a colonoscopy, pt requests head of bed elevated if possible.   Constipation    Cough    Depression    Dysrhythmia    RBBB on 06-06-17 ekg    Elevated liver function tests    Esophageal stricture    Family history of breast cancer    Gallstones    Genetic testing 04/02/2018   STAT Breast panel with reflex to Multi-Cancer panel (83 genes) @ [facility] - No pathogenic mutations detected   GERD (gastroesophageal reflux disease)    Hiatal hernia    History of kidney stones    History of radiation therapy 07/09/2018- 08/03/18   Left Breast/ 40.05 Gy in 15 fractions. Left Breast boost 10 Gy in 5 fractions.    Hyperlipemia    Hypothyroidism    Insomnia    Malignant neoplasm of upper-outer quadrant of left female breast (HCC)    Nephrolithiasis    Personal history of radiation therapy    Pneumonia 2015   aspirated after colonosopy   Renal cyst    Past Surgical History:  Procedure Laterality Date   BREAST BIOPSY Left 05/31/2019   mri   BREAST BIOPSY Left 04/2019   malignant   BREAST LUMPECTOMY Left 04/17/2018   malignant   BREAST LUMPECTOMY WITH RADIOACTIVE SEED AND SENTINEL LYMPH NODE BIOPSY Left 04/17/2018   Procedure: LEFT BREAST LUMPECTOMY WITH BRACKETED RADIOACTIVE SEEDS AND SENTINEL LYMPH NODE BIOPSY;   Surgeon: Enid Harry, MD;  Location: [facility];  Service: General;  Laterality: Left;   BREAST SURGERY Left    Lumpectomy   CARDIOVASCULAR STRESS TEST  07/19/2006   EF 70%, NO EVIDENCE OF ISCHEMIA   CHOLECYSTECTOMY  12/30/10   COLONOSCOPY     DILATION AND CURETTAGE OF UTERUS     HYSTEROSCOPY WITH D & C N/A 06/07/2018   Procedure: DILATATION AND CURETTAGE /HYSTEROSCOPY;  Surgeon: Thurman Flores, MD;  Location: [facility];  Service: Gynecology;  Laterality: N/A;   inguinal herniography     NASAL SINUS SURGERY     RADIOACTIVE SEED GUIDED EXCISIONAL BREAST BIOPSY Right 04/17/2018   Procedure: RIGHT BREAST SEED GUIDED EXCISIONAL BIOPSY;  Surgeon: Enid Harry, MD;  Location: [facility];  Service: General;  Laterality: Right;   RE-EXCISION OF BREAST LUMPECTOMY Left 05/15/2018   Procedure: RE-EXCISION OF LEFT BREAST LUMPECTOMY, ASPIRATION OF LEFT AXILLARY SEROMA;  Surgeon: Enid Harry, MD;  Location: [facility];  Service: General;  Laterality: Left;   T & A     TOOTH EXTRACTION     TOTAL KNEE ARTHROPLASTY Left 09/11/2017   Procedure: LEFT TOTAL KNEE ARTHROPLASTY;  Surgeon: Liliane Rei, MD;  Location: [facility];  Service: Orthopedics;  Laterality: Left;  with block   US  ECHOCARDIOGRAPHY  07/17/2006   EF 55-60%   Patient Active Problem List   Diagnosis Date Noted   Other specified disorders of eustachian tube, right ear 11/15/2023   Chronic rhinitis 11/15/2023   Sensorineural hearing loss, bilateral 11/15/2023   Spinal stenosis of lumbar region with radiculopathy 10/12/2021   Chronic postoperative pain 10/12/2021   Breast mass, right 03/27/2020   Hepatic steatosis 01/22/2020   Genetic testing 04/02/2018   Family history of breast cancer    Malignant neoplasm of upper-outer quadrant of left breast in female, estrogen receptor positive (HCC) 03/16/2018   OA (osteoarthritis) of knee 09/11/2017   Infrapatellar bursitis of right knee  04/29/2014   Pneumonia, aspiration (HCC) 12/28/2012   HYPOTHYROIDISM 11/15/2010   HYPERLIPIDEMIA 11/15/2010   DEPRESSION 11/15/2010   GERD 11/15/2010   CONSTIPATION 11/15/2010   GALLSTONES 11/15/2010   RENAL CYST 11/15/2010   INSOMNIA UNSPECIFIED 11/15/2010   COUGH 11/15/2010   ABDOMINAL PAIN OTHER SPECIFIED SITE 11/15/2010   TRANSAMINASES, SERUM, ELEVATED 11/15/2010    REFERRING PROVIDER: Enid Harry, MD  REFERRING DIAG: Left arm arm swelling  THERAPY DIAG:  Abnormal posture  Left shoulder pain, unspecified chronicity  Lymphedema, not elsewhere classified  ONSET DATE: 2-3 months ago  Rationale for Evaluation and Treatment: Rehabilitation  SUBJECTIVE:                                                                                                                                                                                           SUBJECTIVE STATEMENT:  There is still swelling at my wrist and forearm. I wonder if it will ever go away. I still have not heard from [facility] [employer] yet.     PERTINENT HISTORY:  04/17/2018 Bilateral lumpectomies with left SLNB with 0+/1 LN. Gr. 2 ILC Er/PR+, HER 2-, Ki 67 5%. Radiation 09/11/2018 -08/03/2018. Most recent prior PT treatment 04/05/2022 to 05/17/22 with this therapist. 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  PAIN:  PAIN:  Are you having pain? Yes NPRS scale: 4/10 Pain location: anterior shoulder/upper arm Pain orientation: Left and Anterior  PAIN TYPE: throbbing and sore Pain description: constant  Aggravating factors: exercises last time and increased ROM Relieving factors: rest and gentle stretching   PRECAUTIONS: Left TKA, sciatica, Lympedema(left), small brain aneurysm  RED FLAGS: None   WEIGHT BEARING RESTRICTIONS: No  FALLS:  Has patient fallen in last 6 months? No  LIVING ENVIRONMENT: Lives with: lives with their spouse Lives in: House/apartment OCCUPATION: retired   LEISURE: reading , gardening  HAND  DOMINANCE: right   PRIOR LEVEL OF FUNCTION:  Independent  PATIENT GOALS: ease the pain, move better, check swelling   OBJECTIVE: Note: Objective measures were completed at Evaluation unless otherwise noted.  COGNITION: Overall cognitive status: Within functional limits for tasks assessed   PALPATION: Tender at proximal biceps, supraspinatus, mid deltoid,tender left pectorals, breast incision on left.  OBSERVATIONS / OTHER ASSESSMENTS: positive left impingement, positive empty  SENSATION: Light touch: Appears intact   POSTURE: forward head, rounded shoulders  UPPER EXTREMITY AROM/PROM:  A/PROM RIGHT   eval   Shoulder extension 65  Shoulder flexion 120  Shoulder abduction 164, slight scaption  Shoulder internal rotation 75  Shoulder external rotation 90    (Blank rows = not tested)  A/PROM LEFT   eval  Shoulder extension 54, tight  Shoulder flexion 120, tight , mild pain at top of shoulder  Shoulder abduction 90  Shoulder internal rotation   Shoulder external rotation     (Blank rows = not tested)  CERVICAL AROM:  within functional limits:     UPPER EXTREMITY STRENGTH: functional but limited by pain on left  LYMPHEDEMA ASSESSMENTS:   SURGERY TYPE/DATE: 04/17/2018 Bilateral lumpectomies with Left SLNB, had a seroma on left  NUMBER OF LYMPH NODES REMOVED: 0+/1  CHEMOTHERAPY: NO  RADIATION:Yes   HORMONE TREATMENT: end 07/2018  INFECTIONS: NO   LYMPHEDEMA ASSESSMENTS:   LANDMARK RIGHT  eval  At axilla  33.0  15 cm proximal to olecranon process 32.3  10 cm proximal to olecranon process 30.8  Olecranon process 25.7  15 cm proximal to ulnar styloid process 23.3  10 cm proximal to ulnar styloid process 20.1  Just proximal to ulnar styloid process 14.2  Across hand at thumb web space 18.0  At base of 2nd digit 6.2  (Blank rows = not tested)  LANDMARK LEFT  eval Left 03/15/24 LEFT 03/20/2024 LEFT 03/22/2024 Left 04/03/24 LEFT 04/22/2024 Left 05/02/2024   At axilla  33.7  32.3  31.7 32.9 32.4  15 cm proximal to olecranon process 33.5  33.3  30.7 30.9 30.7  10 cm proximal to olecranon process 32.3  31.9 30.7 29.6 29.8 30.2  Olecranon process 26.8  26.7 26.2 25.4 25.4 25.3  15 cm proximal to ulnar styloid process 23.3  23.5 23.5 23.5 22.9 22.7  10 cm proximal to ulnar styloid process 20.0  20.1 20.1 20.4 20.3 19.6  Just proximal to ulnar styloid process 15.25 15.2 15.2 15.0 14.8 14.9 14.8  Across hand at thumb web space 18.4  17.8  16.7 17.8 17.6  At base of 2nd digit 6.1  6.0 5.9 5.8 5.8 5.9  (Blank rows = not tested)   FUNCTIONAL TESTS:    GAIT: flexed posture,increased kyphosis, independent     QUICK DASH SURVEY: 40.91 EVAL, 04/22/2024 43.18                                                                                                                            TREATMENT DATE:  Emailed and called  [employer] on pt behalf. They are going to call her tomorrow to set up demo Tried on Size 3 gauntlet. Painful on her thumb. Gave her info on where to go to try and try on different gauntlets. [facility]old to [facility] to see if they have some in stock. Measured pts arm; doing very well overall with good reduction especially at upper arm. Asked pt to return demonstrate self MLD but she had great difficulty doing so. Walked pt through the steps In supine: Short neck, 5 breaths, Rt axillary  and establishment of anterior interaxillary pathway, Lt inguinal nodes and establishment of Lt axilloinguinal pathway, then L UE working proximal to distal, moving fluid from upper inner arm outwards, and doing both sides of forearm, and hand moving fluid towards pathways spending extra time in any areas of fibrosis then retracing all steps . Therapist demonstrated all, and pt required moderate VC's for sequence and technique. Advised pt to  look at her handout before performing. Pt assisted with donning sleeve using the arm  butler   04/29/24: Manual Therapy In supine: Short neck, superficial and deep abdominals, Rt axillary and pectoral nodes, intact anterior thorax sequence and establishment of anterior interaxillary pathway, Lt inguinal nodes and establishment of Lt axilloinguinal pathway, then L UE working proximal to distal, moving fluid from upper inner arm outwards, and doing both sides of forearm, and hand moving fluid towards pathways spending extra time in any areas of fibrosis then retracing all steps  PROM to left shoulder flexion, scaption, abduction, D2 and ER with VC's prn to relax STM to Lt axilla at pect insertion and to bicep tendon where pt reports most pain today  04/25/2024 Reminded pt to send gantlet back and wrote down company name for her to call Had pt try on size 2 [employer] gauntlet;too small, and size 3;seemed to fit well but her thumb was already sore from the [employer]. May be worth trying again to see if its a good fit. Also discussed having her go to [facility] to try on different gauntlets and can purchase there or we will purchase for her if she gets name and size. Told her [employer] [employer] will be calling to set up Flexi touch demo;gave her pamphlet on Flexi touch. She would like to do it at home so her husband is there. Perfomed standing postural exercises; Scapular retraction, shoulder extension and bilateral ER all x 10 with yellow. Only occasional VC's needed Gave longer band so she can make a knot and place in door or use her stairway. Supine horizontal abd with yellow x 10. VC's and visual cues to exten all the way across chest with band. STM with cocoa butter to left UT, pecs, and lateral trunk . PROM to left shoulder flex, scaption, abd, ER. MLDShort neck, superficial and deep abdominals, R axillary nodes and establishment of interaxillary pathway, L inguinal nodes and establishment of axilloinguinal pathway, then L UE working proximal to distal, moving fluid from  upper inner arm outwards, and doing both sides of forearm, and hand moving fluid towards pathways spending extra time in any areas of fibrosis then retracing all steps  Had pt use arm butler for donning her sleeve, and with min assist got on easily and all the way up.    04/22/2024 Pt remeasured for Recert Reviewed goals with pt. For recert At pt request called [employer] to advise pt proper way to make return with the leg donning butler and order  the arm butler, We tried having pt use the leg butler to don her sleeve, but she had difficulty and wants the proper arm butler Then used [employer] to order the arm Randal Bury for pt to be delivered to her home. [employer] emailed pt how to make return of the other item. Checked gauntlet large at wrist and small at thumb. Pt will try stretching thumb with high lighter overnight. Her hand is not swelling with sleeve on. Advised she should not continue to wear the gauntlet if it is too tight on thumb or causing numbness or pain. Also discussed exercises and she indicates she is still doing band exercises that cause shoulder pain. Pt does have some memory deficits and was advised again to discontinue supine exercises and do standing ones only. Did not have time to review self MLD, although pt feels she is doing well.  04/18/2024  scapular retraction, shoulder extension and bilateral ER using good posture x 10 with yellow band.  Also performed supine horizontal abduction with yellow x 10. Pt advised to discontinue supine exercises that involve going overhead. May do horizontal abd only. Pt in sitting to practice MLD with therapist demonstrating and pt return demonstrating.  Short neck, 5 diaphragmatic breaths, R axillary nodes and establishment of interaxillary pathway, L inguinal nodes and establishment of axilloinguinal pathway, then L UE working proximal to distal, moving fluid from upper inner arm outwards, and doing both sides of forearm moving fluid  towards pathways spending extra time in any areas of fibrosis then retracing all steps  and ending with LN's. Pt practiced each step after watching therapist. She required Max VC's and tactile cues initially to get proper stretch, but did exceptionally well after practicing with a more relaxed hand. Written handout for MLD given to pt. Pt has still not received gauntlet or night garment but [employer] did message to say it is with insurance. She has not yet heard from [employer] Will wait until Monday before ordering garments to see if her gauntlet arrives over the weekend. Measure next, review MLD, Recert.  04/15/2024 Educated pt in standing theraband exercises including scapular retraction, shoulder extension and bilateral ER using good posture x 10 and updated HEP. Also performed supine horizontal abduction with yellow x 10. Discussed garments and pt would like 2 more sleeves, another gauntlet and a glove. Faxed script to Dr. Delane Fear STM to Lt axilla at pect insertionT, then to lateral trunk where still tight with cocoa butter PROM to left shoulder flexion, scaption, abduction and ER with VC's prn to relax In supine: Short neck, superficial and deep abdominals, R axillary nodes and establishment of interaxillary pathway, L inguinal nodes and establishment of axilloinguinal pathway, then L UE working proximal to distal, moving fluid from upper inner arm outwards, and doing both sides of forearm, and hand moving fluid towards pathways spending extra time in any areas of fibrosis then retracing all steps  Had pt use arm butler for donning her sleeve and used gloves to assist  Did very well overall.. Discussed teaching pt MLD in sitting next visit.        PATIENT EDUCATION:  04/15/2024 Standing Scapular retraction, bilateral shoulder ext and bilateral ER with yellow x 10. Emphasis on proper posture Education details: Supine scapular series Person educated: Patient Education method: Explanation,  demonstration, tactile and VC's and handout issued Education comprehension: verbalized understanding, returned demo, tactile cues and pt will benefit from further review  HOME EXERCISE PROGRAM: Supine scapular series with yellow theraband,  standing postureal exercises;SR, shoulder ext, bilateral eR Discontinue Supine exs except horizontal abd due to pain ASSESSMENT:  CLINICAL IMPRESSION:   [facility] [employer] supposed to call pt tomorrow to set up demo. She has still not received night garment from [employer] but did give them the correct [employer] number. She had great difficulty today with self MLD and will need review.   OBJECTIVE IMPAIRMENTS: decreased activity tolerance, decreased knowledge of condition, decreased ROM, decreased strength, increased edema, impaired UE functional use, postural dysfunction, and pain.   ACTIVITY LIMITATIONS: lifting, sleeping, dressing, and reach over head  PARTICIPATION LIMITATIONS: cleaning and laundry  PERSONAL FACTORS:  Left breast Cancer, radiation are also patient's functional outcome.   REHAB POTENTIAL: Good  CLINICAL DECISION MAKING: Evolving/moderate complexity  EVALUATION COMPLEXITY: Moderate  GOALS: Goals reviewed with patient? Yes  SHORT TERM GOALS: Target date: 04/01/2024  Pt will be independent and compliant with HEP for left shoulder ROM/strength  Baseline: Goal status: MET  2.  Pt will be fit for appropriate flat knit compression sleeve to address left UE lymphedema Baseline:  Goal status: MET 04/08/2024 3.  Pt will have decreased left shoulder/axillary pain by 25% or more Baseline:  Goal status: MET 4.  Quick dash will improve to greater than 30% to demonstrate improved function Baseline:  Goal status:In Progress; slightly worse today  LONG TERM GOALS: Target date: 04/22/2024  Pt will have decreased complaints left shoulder/axillary pain  by 50%  Baseline:  Goal status: In Progress 2.  Pt will have left shoulder ROM WNL as  compared to right UE   Baseline:  Goal status: In Progress 3.  Pts quick dash will be no greater than 22% to demonstrate improved function in left shoulder Baseline:  Goal status: In Progress  4.  Pt will be educated in self MLD to decrease left UE swelling prn Baseline:  Goal status: In Progress, doing at home but needs 1 session to review  PLAN:  PT FREQUENCY: 2x/week  PT DURATION: 3  weeks  PLANNED INTERVENTIONS: 97164- PT Re-evaluation, 97110-Therapeutic exercises, 97530- Therapeutic activity, 97112- Neuromuscular re-education, 97535- Self Care, 75643- Manual therapy, [phone]- Orthotic Fit/training, Patient/Family education, and Joint mobilization  PLAN FOR NEXT SESSION,: order sleeves?  When ready assist pt with ordering more sleeves and gauntlet (maybe one glove?), Review  Self MLD in sitting next,Cont STM left UT, axillary border of pectorals, Review standing Band exs prn  04/03/24 - Faxed demographics and PT note to [facility] [employer] and [employer] fit. Also faxed script to Dr .Delane Fear for the nighttime garment. VR 04/15/2024; messaged Leah re; flexi, Faxed script to Dr. Delane Fear for garments to order through insurance Latisha Poland, PT 05/02/2024, 12:09 PM  Over Head Pull: Narrow and Wide Grip   [facility] [phone]   On back, knees bent, feet flat, band across thighs, elbows straight but relaxed. Pull hands apart (start). Keeping elbows straight, bring arms up and over head, hands toward floor. Keep pull steady on band. Hold momentarily. Return slowly, keeping pull steady, back to start. Then do same with a wider grip on the band (past shoulder width) Repeat _5-10__ times. Band color __yellow____   Side Pull: Double Arm   On back, knees bent, feet flat. Arms perpendicular to body, shoulder level, elbows straight but relaxed. Pull arms out to sides, elbows straight. Resistance band comes across collarbones, hands toward floor. Hold momentarily. Slowly return to starting  position. Repeat _5-10__ times. Band color _yellow____   Sword   On back,  knees bent, feet flat, left hand on left hip, right hand above left. Pull right arm DIAGONALLY (hip to shoulder) across chest. Bring right arm along head toward floor. Hold momentarily. Slowly return to starting position. Repeat _5-10__ times. Do with left arm. Band color _yellow_____   Shoulder Rotation: Double Arm   On back, knees bent, feet flat, elbows tucked at sides, bent 90, hands palms up. Pull hands apart and down toward floor, keeping elbows near sides. Hold momentarily. Slowly return to starting position. Repeat _5-10__ times. Band color __yellow____

## 2024-05-09 ENCOUNTER — Ambulatory Visit: Payer: Self-pay

## 2024-05-09 DIAGNOSIS — I890 Lymphedema, not elsewhere classified: Secondary | ICD-10-CM

## 2024-05-09 DIAGNOSIS — M25612 Stiffness of left shoulder, not elsewhere classified: Secondary | ICD-10-CM | POA: Diagnosis not present

## 2024-05-09 DIAGNOSIS — R293 Abnormal posture: Secondary | ICD-10-CM | POA: Diagnosis not present

## 2024-05-09 DIAGNOSIS — R600 Localized edema: Secondary | ICD-10-CM

## 2024-05-09 DIAGNOSIS — M25512 Pain in left shoulder: Secondary | ICD-10-CM | POA: Diagnosis not present

## 2024-05-09 NOTE — Therapy (Addendum)
 OUTPATIENT PHYSICAL THERAPY  UPPER EXTREMITY ONCOLOGY TREATMENT  Patient Name: [name] MRN: [id number] DOB:Jul 26, 1946, 78 y.o., female Today's Date: 05/09/2024  END OF SESSION:  PT End of Session - 05/09/24 1459     Visit Number 20    Number of Visits 22    Date for PT Re-Evaluation 05/13/24    Authorization Type Medicare    PT Start Time 1505    PT Stop Time 1600    PT Time Calculation (min) 55 min    Activity Tolerance Patient tolerated treatment well    Behavior During Therapy WFL for tasks assessed/performed             Past Medical History:  Diagnosis Date   Arthritis    hands and knees   Complication of anesthesia    aspiration pna; following a colonoscopy, pt requests head of bed elevated if possible.   Constipation    Cough    Depression    Dysrhythmia    RBBB on 06-06-17 ekg    Elevated liver function tests    Esophageal stricture    Family history of breast cancer    Gallstones    Genetic testing 04/02/2018   STAT Breast panel with reflex to Multi-Cancer panel (83 genes) @ [facility] - No pathogenic mutations detected   GERD (gastroesophageal reflux disease)    Hiatal hernia    History of kidney stones    History of radiation therapy 07/09/2018- 08/03/18   Left Breast/ 40.05 Gy in 15 fractions. Left Breast boost 10 Gy in 5 fractions.    Hyperlipemia    Hypothyroidism    Insomnia    Malignant neoplasm of upper-outer quadrant of left female breast (HCC)    Nephrolithiasis    Personal history of radiation therapy    Pneumonia 2015   aspirated after colonosopy   Renal cyst    Past Surgical History:  Procedure Laterality Date   BREAST BIOPSY Left 05/31/2019   mri   BREAST BIOPSY Left 04/2019   malignant   BREAST LUMPECTOMY Left 04/17/2018   malignant   BREAST LUMPECTOMY WITH RADIOACTIVE SEED AND SENTINEL LYMPH NODE BIOPSY Left 04/17/2018   Procedure: LEFT BREAST LUMPECTOMY WITH BRACKETED RADIOACTIVE SEEDS AND SENTINEL LYMPH NODE BIOPSY;   Surgeon: Ebbie Cough, MD;  Location: [facility];  Service: General;  Laterality: Left;   BREAST SURGERY Left    Lumpectomy   CARDIOVASCULAR STRESS TEST  07/19/2006   EF 70%, NO EVIDENCE OF ISCHEMIA   CHOLECYSTECTOMY  12/30/10   COLONOSCOPY     DILATION AND CURETTAGE OF UTERUS     HYSTEROSCOPY WITH D & C N/A 06/07/2018   Procedure: DILATATION AND CURETTAGE /HYSTEROSCOPY;  Surgeon: Mat Browning, MD;  Location: [facility];  Service: Gynecology;  Laterality: N/A;   inguinal herniography     NASAL SINUS SURGERY     RADIOACTIVE SEED GUIDED EXCISIONAL BREAST BIOPSY Right 04/17/2018   Procedure: RIGHT BREAST SEED GUIDED EXCISIONAL BIOPSY;  Surgeon: Ebbie Cough, MD;  Location: [facility];  Service: General;  Laterality: Right;   RE-EXCISION OF BREAST LUMPECTOMY Left 05/15/2018   Procedure: RE-EXCISION OF LEFT BREAST LUMPECTOMY, ASPIRATION OF LEFT AXILLARY SEROMA;  Surgeon: Ebbie Cough, MD;  Location: [facility];  Service: General;  Laterality: Left;   T & A     TOOTH EXTRACTION     TOTAL KNEE ARTHROPLASTY Left 09/11/2017   Procedure: LEFT TOTAL KNEE ARTHROPLASTY;  Surgeon: Melodi Lerner, MD;  Location: [facility];  Service: Orthopedics;  Laterality: Left;  with block   US  ECHOCARDIOGRAPHY  07/17/2006   EF 55-60%   Patient Active Problem List   Diagnosis Date Noted   Other specified disorders of eustachian tube, right ear 11/15/2023   Chronic rhinitis 11/15/2023   Sensorineural hearing loss, bilateral 11/15/2023   Spinal stenosis of lumbar region with radiculopathy 10/12/2021   Chronic postoperative pain 10/12/2021   Breast mass, right 03/27/2020   Hepatic steatosis 01/22/2020   Genetic testing 04/02/2018   Family history of breast cancer    Malignant neoplasm of upper-outer quadrant of left breast in female, estrogen receptor positive (HCC) 03/16/2018   OA (osteoarthritis) of knee 09/11/2017   Infrapatellar bursitis of right knee  04/29/2014   Pneumonia, aspiration (HCC) 12/28/2012   HYPOTHYROIDISM 11/15/2010   HYPERLIPIDEMIA 11/15/2010   DEPRESSION 11/15/2010   GERD 11/15/2010   CONSTIPATION 11/15/2010   GALLSTONES 11/15/2010   RENAL CYST 11/15/2010   INSOMNIA UNSPECIFIED 11/15/2010   COUGH 11/15/2010   ABDOMINAL PAIN OTHER SPECIFIED SITE 11/15/2010   TRANSAMINASES, SERUM, ELEVATED 11/15/2010    REFERRING PROVIDER: Ebbie Cough, MD  REFERRING DIAG: Left arm arm swelling  THERAPY DIAG:  Abnormal posture  Left shoulder pain, unspecified chronicity  Lymphedema, not elsewhere classified  ONSET DATE: 2-3 months ago  Rationale for Evaluation and Treatment: Rehabilitation  SUBJECTIVE:                                                                                                                                                                                           SUBJECTIVE STATEMENT:  I tried the Nimble and I liked it, but I hated the Flexi touch. It felt Like I was in a straight jacket.  She said I can start in the Nimble and transition to the Flexi if I want to.I am supposed to get the Nimble next week and use it for a month. I am used to the sleeve now and I really like it. I wish I could find a gauntlet that is comfortable. I have been using my paper to do the MLD so I know I am doing it right.     PERTINENT HISTORY:  04/17/2018 Bilateral lumpectomies with left SLNB with 0+/1 LN. Gr. 2 ILC Er/PR+, HER 2-, Ki 67 5%. Radiation 09/11/2018 -08/03/2018. Most recent prior PT treatment 04/05/2022 to 05/17/22 with this therapist. 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  PAIN:  PAIN:  Are you having pain? Yes NPRS scale: 4/10 Pain location: anterior shoulder/upper arm Pain orientation: Left and Anterior  PAIN TYPE: throbbing and sore Pain description: constant  Aggravating factors: exercises last time and increased  ROM Relieving factors: rest and gentle stretching   PRECAUTIONS: Left TKA, sciatica,  Lympedema(left), small brain aneurysm  RED FLAGS: None   WEIGHT BEARING RESTRICTIONS: No  FALLS:  Has patient fallen in last 6 months? No  LIVING ENVIRONMENT: Lives with: lives with their spouse Lives in: House/apartment OCCUPATION: retired   LEISURE: reading , gardening  HAND DOMINANCE: right   PRIOR LEVEL OF FUNCTION: Independent  PATIENT GOALS: ease the pain, move better, check swelling   OBJECTIVE: Note: Objective measures were completed at Evaluation unless otherwise noted.  COGNITION: Overall cognitive status: Within functional limits for tasks assessed   PALPATION: Tender at proximal biceps, supraspinatus, mid deltoid,tender left pectorals, breast incision on left.  OBSERVATIONS / OTHER ASSESSMENTS: positive left impingement, positive empty  SENSATION: Light touch: Appears intact   POSTURE: forward head, rounded shoulders  UPPER EXTREMITY AROM/PROM:  A/PROM RIGHT   eval   Shoulder extension 65  Shoulder flexion 120  Shoulder abduction 164, slight scaption  Shoulder internal rotation 75  Shoulder external rotation 90    (Blank rows = not tested)  A/PROM LEFT   eval  Shoulder extension 54, tight  Shoulder flexion 120, tight , mild pain at top of shoulder  Shoulder abduction 90  Shoulder internal rotation   Shoulder external rotation     (Blank rows = not tested)  CERVICAL AROM:  within functional limits:     UPPER EXTREMITY STRENGTH: functional but limited by pain on left  LYMPHEDEMA ASSESSMENTS:   SURGERY TYPE/DATE: 04/17/2018 Bilateral lumpectomies with Left SLNB, had a seroma on left  NUMBER OF LYMPH NODES REMOVED: 0+/1  CHEMOTHERAPY: NO  RADIATION:Yes   HORMONE TREATMENT: end 07/2018  INFECTIONS: NO   LYMPHEDEMA ASSESSMENTS:   LANDMARK RIGHT  eval  At axilla  33.0  15 cm proximal to olecranon process 32.3  10 cm proximal to olecranon process 30.8  Olecranon process 25.7  15 cm proximal to ulnar styloid process 23.3  10  cm proximal to ulnar styloid process 20.1  Just proximal to ulnar styloid process 14.2  Across hand at thumb web space 18.0  At base of 2nd digit 6.2  (Blank rows = not tested)  LANDMARK LEFT  eval Left 03/15/24 LEFT 03/20/2024 LEFT 03/22/2024 Left 04/03/24 LEFT 04/22/2024 Left 05/02/2024  At axilla  33.7  32.3  31.7 32.9 32.4  15 cm proximal to olecranon process 33.5  33.3  30.7 30.9 30.7  10 cm proximal to olecranon process 32.3  31.9 30.7 29.6 29.8 30.2  Olecranon process 26.8  26.7 26.2 25.4 25.4 25.3  15 cm proximal to ulnar styloid process 23.3  23.5 23.5 23.5 22.9 22.7  10 cm proximal to ulnar styloid process 20.0  20.1 20.1 20.4 20.3 19.6  Just proximal to ulnar styloid process 15.25 15.2 15.2 15.0 14.8 14.9 14.8  Across hand at thumb web space 18.4  17.8  16.7 17.8 17.6  At base of 2nd digit 6.1  6.0 5.9 5.8 5.8 5.9  (Blank rows = not tested)   FUNCTIONAL TESTS:    GAIT: flexed posture,increased kyphosis, independent     QUICK DASH SURVEY: 40.91 EVAL, 04/22/2024 43.18  TREATMENT DATE:   05/09/2024 Discussed Nimble vs Flexitouch. Pt prefers Nimble to try and then transition to Flexi touch prn. She is aware that she has to do MLD with the Nimbl. Reviewed self MLD with pt in supine with head of table elevated. Walked pt through the steps In supine: Short neck, 5 breaths, Rt axillary  and establishment of anterior interaxillary pathway, Lt inguinal nodes and establishment of Lt axilloinguinal pathway, then L UE working proximal to distal, moving fluid from upper inner arm outwards, and doing both sides of forearm, and hand moving fluid towards pathways spending extra time in any areas of fibrosis then retracing all steps . Therapist demonstrated all, and pt required Min  to mod VC and TC's for sequence and technique. Advised pt to  look at her handout before  performing, and also to tell herself before performing what she thinks the correct technique is and then check herself with the paper. Pt assisted with donning sleeve using the arm butler. Discussed gauntlet; tried on Medi Harmonys Size 3 again; doesn't like. Thinks she may want the one she sent back to [employer] that was [employer]. Suggested she consider Custom flat knit due to her arthritic thumb, and may want to order Medi Mondi flat knit sleeves thru insurance.   05/02/2024 Emailed and called [employer] on pt behalf. They are going to call her tomorrow to set up demo Tried on Size 3 gauntlet. Painful on her thumb. Gave her info on where to go to try and try on different gauntlets. Told to [facility] to see if they have some in stock. Measured pts arm; doing very well overall with good reduction especially at upper arm. Asked pt to return demonstrate self MLD but she had great difficulty doing so. Walked pt through the steps In supine: Short neck, 5 breaths, Rt axillary  and establishment of anterior interaxillary pathway, Lt inguinal nodes and establishment of Lt axilloinguinal pathway, then L UE working proximal to distal, moving fluid from upper inner arm outwards, and doing both sides of forearm, and hand moving fluid towards pathways spending extra time in any areas of fibrosis then retracing all steps . Therapist demonstrated all, and pt required moderate VC's for sequence and technique. Advised pt to  look at her handout before performing. Pt assisted with donning sleeve using the arm butler   04/29/24: Manual Therapy In supine: Short neck, superficial and deep abdominals, Rt axillary and pectoral nodes, intact anterior thorax sequence and establishment of anterior interaxillary pathway, Lt inguinal nodes and establishment of Lt axilloinguinal pathway, then L UE working proximal to distal, moving fluid from upper inner arm outwards, and doing both sides of forearm, and  hand moving fluid towards pathways spending extra time in any areas of fibrosis then retracing all steps  PROM to left shoulder flexion, scaption, abduction, D2 and ER with VC's prn to relax STM to Lt axilla at pect insertion and to bicep tendon where pt reports most pain today  04/25/2024 Reminded pt to send gantlet back and wrote down company name for her to call Had pt try on size 2 Medi Harmony gauntlet;too small, and size 3;seemed to fit well but her thumb was already sore from the [employer]. May be worth trying again to see if its a good fit. Also discussed having her go to [facility] to try on different gauntlets and can purchase there or we will purchase for her if she gets name and size.  Told her [employer] [employer] will be calling to set up Flexi touch demo;gave her pamphlet on Flexi touch. She would like to do it at home so her husband is there. Perfomed standing postural exercises; Scapular retraction, shoulder extension and bilateral ER all x 10 with yellow. Only occasional VC's needed Gave longer band so she can make a knot and place in door or use her stairway. Supine horizontal abd with yellow x 10. VC's and visual cues to exten all the way across chest with band. STM with cocoa butter to left UT, pecs, and lateral trunk . PROM to left shoulder flex, scaption, abd, ER. MLDShort neck, superficial and deep abdominals, R axillary nodes and establishment of interaxillary pathway, L inguinal nodes and establishment of axilloinguinal pathway, then L UE working proximal to distal, moving fluid from upper inner arm outwards, and doing both sides of forearm, and hand moving fluid towards pathways spending extra time in any areas of fibrosis then retracing all steps  Had pt use arm butler for donning her sleeve, and with min assist got on easily and all the way up.    04/22/2024 Pt remeasured for Recert Reviewed goals with pt. For recert At pt request called [employer] to  advise pt proper way to make return with the leg donning butler and order the arm butler, We tried having pt use the leg butler to don her sleeve, but she had difficulty and wants the proper arm butler Then used [employer] to order the arm [place]ana for pt to be delivered to her home. [employer] emailed pt how to make return of the other item. Checked gauntlet large at wrist and small at thumb. Pt will try stretching thumb with high lighter overnight. Her hand is not swelling with sleeve on. Advised she should not continue to wear the gauntlet if it is too tight on thumb or causing numbness or pain. Also discussed exercises and she indicates she is still doing band exercises that cause shoulder pain. Pt does have some memory deficits and was advised again to discontinue supine exercises and do standing ones only. Did not have time to review self MLD, although pt feels she is doing well.  04/18/2024  scapular retraction, shoulder extension and bilateral ER using good posture x 10 with yellow band.  Also performed supine horizontal abduction with yellow x 10. Pt advised to discontinue supine exercises that involve going overhead. May do horizontal abd only. Pt in sitting to practice MLD with therapist demonstrating and pt return demonstrating.  Short neck, 5 diaphragmatic breaths, R axillary nodes and establishment of interaxillary pathway, L inguinal nodes and establishment of axilloinguinal pathway, then L UE working proximal to distal, moving fluid from upper inner arm outwards, and doing both sides of forearm moving fluid towards pathways spending extra time in any areas of fibrosis then retracing all steps  and ending with LN's. Pt practiced each step after watching therapist. She required Max VC's and tactile cues initially to get proper stretch, but did exceptionally well after practicing with a more relaxed hand. Written handout for MLD given to pt. Pt has still not received gauntlet or night garment  but [employer] did message to say it is with insurance. She has not yet heard from [employer]. Will wait until Monday before ordering garments to see if her gauntlet arrives over the weekend. Measure next, review MLD, Recert.  04/15/2024 Educated pt in standing theraband exercises including scapular retraction, shoulder extension and bilateral ER using good posture  x 10 and updated HEP. Also performed supine horizontal abduction with yellow x 10. Discussed garments and pt would like 2 more sleeves, another gauntlet and a glove. Faxed script to Dr. Ebbie STM to Lt axilla at pect insertionT, then to lateral trunk where still tight with cocoa butter PROM to left shoulder flexion, scaption, abduction and ER with VC's prn to relax In supine: Short neck, superficial and deep abdominals, R axillary nodes and establishment of interaxillary pathway, L inguinal nodes and establishment of axilloinguinal pathway, then L UE working proximal to distal, moving fluid from upper inner arm outwards, and doing both sides of forearm, and hand moving fluid towards pathways spending extra time in any areas of fibrosis then retracing all steps  Had pt use arm butler for donning her sleeve and used gloves to assist  Did very well overall.. Discussed teaching pt MLD in sitting next visit.        PATIENT EDUCATION:  04/15/2024 Standing Scapular retraction, bilateral shoulder ext and bilateral ER with yellow x 10. Emphasis on proper posture Education details: Supine scapular series Person educated: Patient Education method: Explanation, demonstration, tactile and VC's and handout issued Education comprehension: verbalized understanding, returned demo, tactile cues and pt will benefit from further review  HOME EXERCISE PROGRAM: Supine scapular series with yellow theraband, standing postureal exercises;SR, shoulder ext, bilateral eR Discontinue Supine exs except horizontal abd due to pain   CLINICAL IMPRESSION:  Pt  prefers to start with the Nimble to see if she will get benefit from it, however, she is aware that she needs to always activate LN's and perform pathways intermittently while the unit is on. She will transition to Flexi prn at the end of the 4 weeks prn. She still requires some cueing for technique. She seems to do quite well while here after review but has lapses when she returns. She would like to continue with the Nimble and MLD until she returns on June 18 to be measured for a custom gauntlet.  06/05/2024 ADDENDUM: Pt requires (QTY 2 of each)Mediven Mondi 350 Class 2 Flat knit sleeve with 5 cm beaded top band, and  (Qty 2)Mediven Mondi gauntlets in [place] color, due to falling outside measurements for ready to wear garments and pain when trying to wear ready to wear gauntlet due to arthritis. These are required for control of UE lymphedema.  OBJECTIVE IMPAIRMENTS: decreased activity tolerance, decreased knowledge of condition, decreased ROM, decreased strength, increased edema, impaired UE functional use, postural dysfunction, and pain.   ACTIVITY LIMITATIONS: lifting, sleeping, dressing, and reach over head  PARTICIPATION LIMITATIONS: cleaning and laundry  PERSONAL FACTORS:  Left breast Cancer, radiation are also patient's functional outcome.   REHAB POTENTIAL: Good  CLINICAL DECISION MAKING: Evolving/moderate complexity  EVALUATION COMPLEXITY: Moderate  GOALS: Goals reviewed with patient? Yes  SHORT TERM GOALS: Target date: 04/01/2024  Pt will be independent and compliant with HEP for left shoulder ROM/strength  Baseline: Goal status: MET  2.  Pt will be fit for appropriate flat knit compression sleeve to address left UE lymphedema Baseline:  Goal status: MET 04/08/2024 3.  Pt will have decreased left shoulder/axillary pain by 25% or more Baseline:  Goal status: MET 4.  Quick dash will improve to greater than 30% to demonstrate improved function Baseline:  Goal status:In  Progress; slightly worse today  LONG TERM GOALS: Target date: 04/22/2024  Pt will have decreased complaints left shoulder/axillary pain  by 50%  Baseline:  Goal status: In Progress 2.  Pt  will have left shoulder ROM WNL as compared to right UE   Baseline:  Goal status: In Progress 3.  Pts quick dash will be no greater than 22% to demonstrate improved function in left shoulder Baseline:  Goal status: In Progress  4.  Pt will be educated in self MLD to decrease left UE swelling prn Baseline:  Goal status: In Progress, doing at home but needs further review  PLAN:  PT FREQUENCY: 2x/week  PT DURATION: 3  weeks  PLANNED INTERVENTIONS: 97164- PT Re-evaluation, 97110-Therapeutic exercises, 97530- Therapeutic activity, 97112- Neuromuscular re-education, 97535- Self Care, [id number]- Manual therapy, [phone]- Orthotic Fit/training, Patient/Family education, and Joint mobilization  PLAN FOR NEXT SESSION,: order sleeves?  Consider beign measured 6/18 for custom gauntlet and Medi sleeves to match. When ready assist pt with ordering more sleeves and gauntlet (maybe one glove?), Review  Self MLD in sitting next,Cont STM left UT, axillary border of pectorals, Review standing Band exs prn Suppose to be Getting Nimble on 05/10/2024 RW 04/03/24 - Faxed demographics and PT note to [facility] and [facility] [employer] fit. Also faxed script to Dr .Ebbie for the nighttime garment. VR 04/15/2024; messaged Leah re; flexi, Faxed script to Dr. Ebbie for garments to order through insurance Grayce JINNY Sheldon, PT 05/09/2024, 6:05 PM  Over Head Pull: Narrow and Wide Grip   [facility] [phone]   On back, knees bent, feet flat, band across thighs, elbows straight but relaxed. Pull hands apart (start). Keeping elbows straight, bring arms up and over head, hands toward floor. Keep pull steady on band. Hold momentarily. Return slowly, keeping pull steady, back to start. Then do same with a wider grip on the band (past shoulder  width) Repeat _5-10__ times. Band color __yellow____   Side Pull: Double Arm   On back, knees bent, feet flat. Arms perpendicular to body, shoulder level, elbows straight but relaxed. Pull arms out to sides, elbows straight. Resistance band comes across collarbones, hands toward floor. Hold momentarily. Slowly return to starting position. Repeat _5-10__ times. Band color _yellow____   Sword   On back, knees bent, feet flat, left hand on left hip, right hand above left. Pull right arm DIAGONALLY (hip to shoulder) across chest. Bring right arm along head toward floor. Hold momentarily. Slowly return to starting position. Repeat _5-10__ times. Do with left arm. Band color _yellow_____   Shoulder Rotation: Double Arm   On back, knees bent, feet flat, elbows tucked at sides, bent 90, hands palms up. Pull hands apart and down toward floor, keeping elbows near sides. Hold momentarily. Slowly return to starting position. Repeat _5-10__ times. Band color __yellow____

## 2024-05-28 DIAGNOSIS — R413 Other amnesia: Secondary | ICD-10-CM | POA: Diagnosis not present

## 2024-05-28 DIAGNOSIS — R079 Chest pain, unspecified: Secondary | ICD-10-CM | POA: Diagnosis not present

## 2024-05-28 DIAGNOSIS — M8580 Other specified disorders of bone density and structure, unspecified site: Secondary | ICD-10-CM | POA: Diagnosis not present

## 2024-05-28 DIAGNOSIS — E039 Hypothyroidism, unspecified: Secondary | ICD-10-CM | POA: Diagnosis not present

## 2024-05-28 DIAGNOSIS — I890 Lymphedema, not elsewhere classified: Secondary | ICD-10-CM | POA: Diagnosis not present

## 2024-05-28 DIAGNOSIS — E785 Hyperlipidemia, unspecified: Secondary | ICD-10-CM | POA: Diagnosis not present

## 2024-05-28 DIAGNOSIS — K219 Gastro-esophageal reflux disease without esophagitis: Secondary | ICD-10-CM | POA: Diagnosis not present

## 2024-05-28 DIAGNOSIS — M1990 Unspecified osteoarthritis, unspecified site: Secondary | ICD-10-CM | POA: Diagnosis not present

## 2024-05-28 DIAGNOSIS — R42 Dizziness and giddiness: Secondary | ICD-10-CM | POA: Diagnosis not present

## 2024-05-28 DIAGNOSIS — H6993 Unspecified Eustachian tube disorder, bilateral: Secondary | ICD-10-CM | POA: Diagnosis not present

## 2024-05-28 DIAGNOSIS — F329 Major depressive disorder, single episode, unspecified: Secondary | ICD-10-CM | POA: Diagnosis not present

## 2024-05-28 DIAGNOSIS — G8929 Other chronic pain: Secondary | ICD-10-CM | POA: Diagnosis not present

## 2024-05-28 DIAGNOSIS — I6529 Occlusion and stenosis of unspecified carotid artery: Secondary | ICD-10-CM | POA: Diagnosis not present

## 2024-06-20 DIAGNOSIS — I671 Cerebral aneurysm, nonruptured: Secondary | ICD-10-CM | POA: Diagnosis not present

## 2024-06-28 DIAGNOSIS — T8484XD Pain due to internal orthopedic prosthetic devices, implants and grafts, subsequent encounter: Secondary | ICD-10-CM | POA: Diagnosis not present

## 2024-06-28 DIAGNOSIS — M5416 Radiculopathy, lumbar region: Secondary | ICD-10-CM | POA: Diagnosis not present

## 2024-06-28 DIAGNOSIS — M1712 Unilateral primary osteoarthritis, left knee: Secondary | ICD-10-CM | POA: Diagnosis not present

## 2024-06-28 DIAGNOSIS — M1711 Unilateral primary osteoarthritis, right knee: Secondary | ICD-10-CM | POA: Diagnosis not present

## 2024-06-28 DIAGNOSIS — Z96652 Presence of left artificial knee joint: Secondary | ICD-10-CM | POA: Diagnosis not present

## 2024-06-28 DIAGNOSIS — M160 Bilateral primary osteoarthritis of hip: Secondary | ICD-10-CM | POA: Diagnosis not present

## 2024-06-28 DIAGNOSIS — M48061 Spinal stenosis, lumbar region without neurogenic claudication: Secondary | ICD-10-CM | POA: Diagnosis not present

## 2024-06-28 DIAGNOSIS — Z471 Aftercare following joint replacement surgery: Secondary | ICD-10-CM | POA: Diagnosis not present

## 2024-06-28 DIAGNOSIS — M7051 Other bursitis of knee, right knee: Secondary | ICD-10-CM | POA: Diagnosis not present

## 2024-07-09 ENCOUNTER — Telehealth: Payer: Self-pay

## 2024-07-09 NOTE — Telephone Encounter (Signed)
 Returned pt phone call. She received her new garments she thinks about 2 weeks ago. She is not able to wear the gauntlet because it is irritating her thumb arthritis at the DIP. She is going to try stretching the thumb with a large magic marker. If not improved she will call me back. I will email Deidre tonight to alert her and see if she has any thoughts; perhaps shorter and below DIP. Pt mentioned gauntlet without thumb but explained it might make her thumb swell. Awaiting call from pt.

## 2024-07-17 ENCOUNTER — Ambulatory Visit: Attending: General Surgery

## 2024-07-17 DIAGNOSIS — R293 Abnormal posture: Secondary | ICD-10-CM | POA: Diagnosis not present

## 2024-07-17 DIAGNOSIS — I890 Lymphedema, not elsewhere classified: Secondary | ICD-10-CM | POA: Insufficient documentation

## 2024-07-17 DIAGNOSIS — M25512 Pain in left shoulder: Secondary | ICD-10-CM | POA: Diagnosis not present

## 2024-07-17 NOTE — Therapy (Addendum)
 OUTPATIENT PHYSICAL THERAPY  UPPER EXTREMITY ONCOLOGY TREATMENT  Patient Name: BREANNE OLVERA MRN: [id number] DOB:March 22, 1946, 78 y.o., female Today's Date: 07/17/2024  END OF SESSION:  PT End of Session - 07/17/24 1453     Visit Number 21    Number of Visits 22    Date for PT Re-Evaluation 08/28/24    Authorization Type Medicare    PT Start Time 1457    PT Stop Time 1535    PT Time Calculation (min) 38 min    Activity Tolerance Patient tolerated treatment well    Behavior During Therapy WFL for tasks assessed/performed          Past Medical History:  Diagnosis Date   Arthritis    hands and knees   Complication of anesthesia    aspiration pna; following a colonoscopy, pt requests head of bed elevated if possible.   Constipation    Cough    Depression    Dysrhythmia    RBBB on 06-06-17 ekg    Elevated liver function tests    Esophageal stricture    Family history of breast cancer    Gallstones    Genetic testing 04/02/2018   STAT Breast panel with reflex to Multi-Cancer panel (83 genes) @ [facility] - No pathogenic mutations detected   GERD (gastroesophageal reflux disease)    Hiatal hernia    History of kidney stones    History of radiation therapy 07/09/2018- 08/03/18   Left Breast/ 40.05 Gy in 15 fractions. Left Breast boost 10 Gy in 5 fractions.    Hyperlipemia    Hypothyroidism    Insomnia    Malignant neoplasm of upper-outer quadrant of left female breast (HCC)    Nephrolithiasis    Personal history of radiation therapy    Pneumonia 2015   aspirated after colonosopy   Renal cyst    Past Surgical History:  Procedure Laterality Date   BREAST BIOPSY Left 05/31/2019   mri   BREAST BIOPSY Left 04/2019   malignant   BREAST LUMPECTOMY Left 04/17/2018   malignant   BREAST LUMPECTOMY WITH RADIOACTIVE SEED AND SENTINEL LYMPH NODE BIOPSY Left 04/17/2018   Procedure: LEFT BREAST LUMPECTOMY WITH BRACKETED RADIOACTIVE SEEDS AND SENTINEL LYMPH NODE BIOPSY;  Surgeon:  Ebbie Cough, MD;  Location: [facility];  Service: General;  Laterality: Left;   BREAST SURGERY Left    Lumpectomy   CARDIOVASCULAR STRESS TEST  07/19/2006   EF 70%, NO EVIDENCE OF ISCHEMIA   CHOLECYSTECTOMY  12/30/10   COLONOSCOPY     DILATION AND CURETTAGE OF UTERUS     HYSTEROSCOPY WITH D & C N/A 06/07/2018   Procedure: DILATATION AND CURETTAGE /HYSTEROSCOPY;  Surgeon: Mat Browning, MD;  Location: [facility];  Service: Gynecology;  Laterality: N/A;   inguinal herniography     NASAL SINUS SURGERY     RADIOACTIVE SEED GUIDED EXCISIONAL BREAST BIOPSY Right 04/17/2018   Procedure: RIGHT BREAST SEED GUIDED EXCISIONAL BIOPSY;  Surgeon: Ebbie Cough, MD;  Location: [facility];  Service: General;  Laterality: Right;   RE-EXCISION OF BREAST LUMPECTOMY Left 05/15/2018   Procedure: RE-EXCISION OF LEFT BREAST LUMPECTOMY, ASPIRATION OF LEFT AXILLARY SEROMA;  Surgeon: Ebbie Cough, MD;  Location: [facility];  Service: General;  Laterality: Left;   T & A     TOOTH EXTRACTION     TOTAL KNEE ARTHROPLASTY Left 09/11/2017   Procedure: LEFT TOTAL KNEE ARTHROPLASTY;  Surgeon: Melodi Lerner, MD;  Location: [facility];  Service: Orthopedics;  Laterality: Left;  with block   US  ECHOCARDIOGRAPHY  07/17/2006   EF 55-60%   Patient Active Problem List   Diagnosis Date Noted   Other specified disorders of eustachian tube, right ear 11/15/2023   Chronic rhinitis 11/15/2023   Sensorineural hearing loss, bilateral 11/15/2023   Spinal stenosis of lumbar region with radiculopathy 10/12/2021   Chronic postoperative pain 10/12/2021   Breast mass, right 03/27/2020   Hepatic steatosis 01/22/2020   Genetic testing 04/02/2018   Family history of breast cancer    Malignant neoplasm of upper-outer quadrant of left breast in female, estrogen receptor positive (HCC) 03/16/2018   OA (osteoarthritis) of knee 09/11/2017   Infrapatellar bursitis of right knee 04/29/2014    Pneumonia, aspiration (HCC) 12/28/2012   HYPOTHYROIDISM 11/15/2010   HYPERLIPIDEMIA 11/15/2010   DEPRESSION 11/15/2010   GERD 11/15/2010   CONSTIPATION 11/15/2010   GALLSTONES 11/15/2010   RENAL CYST 11/15/2010   INSOMNIA UNSPECIFIED 11/15/2010   COUGH 11/15/2010   ABDOMINAL PAIN OTHER SPECIFIED SITE 11/15/2010   TRANSAMINASES, SERUM, ELEVATED 11/15/2010    REFERRING PROVIDER: Ebbie Cough, MD  REFERRING DIAG: Left arm arm swelling  THERAPY DIAG:  Abnormal posture  Left shoulder pain, unspecified chronicity  Lymphedema, not elsewhere classified  ONSET DATE: 2-3 months ago  Rationale for Evaluation and Treatment: Rehabilitation  SUBJECTIVE:                                                                                                                                                                                           SUBJECTIVE STATEMENT:  I got the garments around July 14. The sleeve is tight but I can get it on with my donning butler. I can wear the sleeve without the gauntlet and my hand has not swollen. Both joints at my thumb hurt.  I do have the night garment but I can't sleep in it because I am so hot. I sue the Nimble every night during the news and I do my MLD with it.  PERTINENT HISTORY:  04/17/2018 Bilateral lumpectomies with left SLNB with 0+/1 LN. Gr. 2 ILC Er/PR+, HER 2-, Ki 67 5%. Radiation 09/11/2018 -08/03/2018. Most recent prior PT treatment 04/05/2022 to 05/17/22 with this therapist. 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  PAIN:  PAIN:  Are you having pain? Yes NPRS scale: 4/10 Pain location: anterior shoulder/upper arm Pain orientation: Left and Anterior  PAIN TYPE: throbbing and sore Pain description: constant  Aggravating factors: exercises last time and increased ROM Relieving factors: rest and gentle stretching   PRECAUTIONS: Left TKA, sciatica, Lympedema(left), small brain aneurysm  RED FLAGS: None   WEIGHT BEARING RESTRICTIONS:  No  FALLS:  Has patient fallen in last 6 months? No  LIVING ENVIRONMENT: Lives with: lives with their spouse Lives in: House/apartment OCCUPATION: retired   LEISURE: reading , gardening  HAND DOMINANCE: right   PRIOR LEVEL OF FUNCTION: Independent  PATIENT GOALS: ease the pain, move better, check swelling   OBJECTIVE: Note: Objective measures were completed at Evaluation unless otherwise noted.  COGNITION: Overall cognitive status: Within functional limits for tasks assessed   PALPATION: Tender at proximal biceps, supraspinatus, mid deltoid,tender left pectorals, breast incision on left.  OBSERVATIONS / OTHER ASSESSMENTS: positive left impingement, positive empty  SENSATION: Light touch: Appears intact   POSTURE: forward head, rounded shoulders  UPPER EXTREMITY AROM/PROM:  A/PROM RIGHT   eval   Shoulder extension 65  Shoulder flexion 120  Shoulder abduction 164, slight scaption  Shoulder internal rotation 75  Shoulder external rotation 90    (Blank rows = not tested)  A/PROM LEFT   eval  Shoulder extension 54, tight  Shoulder flexion 120, tight , mild pain at top of shoulder  Shoulder abduction 90  Shoulder internal rotation   Shoulder external rotation     (Blank rows = not tested)  CERVICAL AROM:  within functional limits:     UPPER EXTREMITY STRENGTH: functional but limited by pain on left  LYMPHEDEMA ASSESSMENTS:   SURGERY TYPE/DATE: 04/17/2018 Bilateral lumpectomies with Left SLNB, had a seroma on left  NUMBER OF LYMPH NODES REMOVED: 0+/1  CHEMOTHERAPY: NO  RADIATION:Yes   HORMONE TREATMENT: end 07/2018  INFECTIONS: NO   LYMPHEDEMA ASSESSMENTS:   LANDMARK RIGHT  eval  At axilla  33.0  15 cm proximal to olecranon process 32.3  10 cm proximal to olecranon process 30.8  Olecranon process 25.7  15 cm proximal to ulnar styloid process 23.3  10 cm proximal to ulnar styloid process 20.1  Just proximal to ulnar styloid process 14.2   Across hand at thumb web space 18.0  At base of 2nd digit 6.2LEFT  (Blank rows = not tested)  LANDMARK LEFT  eval Left 03/15/24 LEFT 03/20/2024 LEFT 03/22/2024 Left 04/03/24 LEFT 04/22/2024 Left 05/02/2024 LEFT 07/17/2024   At axilla  33.7  32.3  31.7 32.9 32.4 31.5  15 cm proximal to olecranon process 33.5  33.3  30.7 30.9 30.7 30.5  10 cm proximal to olecranon process 32.3  31.9 30.7 29.6 29.8 30.2 30.1  Olecranon process 26.8  26.7 26.2 25.4 25.4 25.3 24.7  15 cm proximal to ulnar styloid process 23.3  23.5 23.5 23.5 22.9 22.7 22.7  10 cm proximal to ulnar styloid process 20.0  20.1 20.1 20.4 20.3 19.6 18.7  Just proximal to ulnar styloid process 15.25 15.2 15.2 15.0 14.8 14.9 14.8 14.55  Across hand at thumb web space 18.4  17.8  16.7 17.8 17.6 17.6  At base of 2nd digit 6.1  6.0 5.9 5.8 5.8 5.9 6.0  (Blank rows = not tested)   FUNCTIONAL TESTS:    GAIT: flexed posture,increased kyphosis, independent     QUICK DASH SURVEY: 40.91 EVAL, 04/22/2024 43.18  TREATMENT DATE:   07/17/2024 Pt brought all of her garments with her. She was wearing her sleeve and it seems to be fine. A tad long at the wrist but she has worn it 3 times without laundering so it is likely stretched. She is able to wear it all day with some elbow discomfort by the end of the day. She tried stretching the thumb on the gauntlet but it still feels too tight at the DIP and MCP joint because it bothers her arthritis there. Shortening the thumb won't help as I didn't realize the problem was at both joints. She said it feels fine at the  [employer] joint. Tried [employer] and left message. Will email this afternoon also. Measured pts arm with excellent results noted. She has been very compliant with her garments and Nimble. Pt demonstrated ability to don her new compression sleeve using the arm butler.  She was minimally assisted with getting the sleeve up just a bit higher. 05/09/2024 Discussed Nimble vs Flexitouch. Pt prefers Nimble to try and then transition to Flexi touch prn. She is aware that she has to do MLD with the Nimbl. Reviewed self MLD with pt in supine with head of table elevated. Walked pt through the steps In supine: Short neck, 5 breaths, Rt axillary  and establishment of anterior interaxillary pathway, Lt inguinal nodes and establishment of Lt axilloinguinal pathway, then L UE working proximal to distal, moving fluid from upper inner arm outwards, and doing both sides of forearm, and hand moving fluid towards pathways spending extra time in any areas of fibrosis then retracing all steps . Therapist demonstrated all, and pt required Min  to mod VC and TC's for sequence and technique. Advised pt to  look at her handout before performing, and also to tell herself before performing what she thinks the correct technique is and then check herself with the paper. Pt assisted with donning sleeve using the arm butler. Discussed gauntlet; tried on Medi Harmonys Size 3 again; doesn't like. Thinks she may want the one she sent back to [employer] that was [employer]. Suggested she consider Custom flat knit due to her arthritic thumb, and may want to order Medi Mondi flat knit sleeves thru insurance.   05/02/2024 Emailed and called [employer] on pt behalf. They are going to call her tomorrow to set up demo Tried on Size 3 gauntlet. Painful on her thumb. Gave her info on where to go to try and try on different gauntlets. Told to [facility] to see if they have some in stock. Measured pts arm; doing very well overall with good reduction especially at upper arm. Asked pt to return demonstrate self MLD but she had great difficulty doing so. Walked pt through the steps In supine: Short neck, 5 breaths, Rt axillary  and establishment of anterior interaxillary pathway, Lt inguinal nodes  and establishment of Lt axilloinguinal pathway, then L UE working proximal to distal, moving fluid from upper inner arm outwards, and doing both sides of forearm, and hand moving fluid towards pathways spending extra time in any areas of fibrosis then retracing all steps . Therapist demonstrated all, and pt required moderate VC's for sequence and technique. Advised pt to  look at her handout before performing. Pt assisted with donning sleeve using the arm butler   04/29/24: Manual Therapy In supine: Short neck, superficial and deep abdominals, Rt axillary and pectoral nodes, intact anterior thorax sequence and establishment of anterior interaxillary pathway, Lt inguinal  nodes and establishment of Lt axilloinguinal pathway, then L UE working proximal to distal, moving fluid from upper inner arm outwards, and doing both sides of forearm, and hand moving fluid towards pathways spending extra time in any areas of fibrosis then retracing all steps  PROM to left shoulder flexion, scaption, abduction, D2 and ER with VC's prn to relax STM to Lt axilla at pect insertion and to bicep tendon where pt reports most pain today  04/25/2024 Reminded pt to send gantlet back and wrote down company name for her to call Had pt try on size 2 Medi Harmony gauntlet;too small, and size 3;seemed to fit well but her thumb was already sore from the [employer]. May be worth trying again to see if its a good fit. Also discussed having her go to [facility] to try on different gauntlets and can purchase there or we will purchase for her if she gets name and size. Told her [employer] [employer] will be calling to set up Flexi touch demo;gave her pamphlet on Flexi touch. She would like to do it at home so her husband is there. Perfomed standing postural exercises; Scapular retraction, shoulder extension and bilateral ER all x 10 with yellow. Only occasional VC's needed Gave longer band so she can make a knot and place in door or  use her stairway. Supine horizontal abd with yellow x 10. VC's and visual cues to exten all the way across chest with band. STM with cocoa butter to left UT, pecs, and lateral trunk . PROM to left shoulder flex, scaption, abd, ER. MLDShort neck, superficial and deep abdominals, R axillary nodes and establishment of interaxillary pathway, L inguinal nodes and establishment of axilloinguinal pathway, then L UE working proximal to distal, moving fluid from upper inner arm outwards, and doing both sides of forearm, and hand moving fluid towards pathways spending extra time in any areas of fibrosis then retracing all steps  Had pt use arm butler for donning her sleeve, and with min assist got on easily and all the way up.    04/22/2024 Pt remeasured for Recert Reviewed goals with pt. For recert At pt request called [employer] to advise pt proper way to make return with the leg donning butler and order the arm butler, We tried having pt use the leg butler to don her sleeve, but she had difficulty and wants the proper arm butler Then used [employer] to order the arm [place]ana for pt to be delivered to her home. [employer] [facility] emailed pt how to make return of the other item. Checked gauntlet large at wrist and small at thumb. Pt will try stretching thumb with high lighter overnight. Her hand is not swelling with sleeve on. Advised she should not continue to wear the gauntlet if it is too tight on thumb or causing numbness or pain. Also discussed exercises and she indicates she is still doing band exercises that cause shoulder pain. Pt does have some memory deficits and was advised again to discontinue supine exercises and do standing ones only. Did not have time to review self MLD, although pt feels she is doing well.  04/18/2024  scapular retraction, shoulder extension and bilateral ER using good posture x 10 with yellow band.  Also performed supine horizontal abduction with yellow x 10. Pt advised to  discontinue supine exercises that involve going overhead. May do horizontal abd only. Pt in sitting to practice MLD with therapist demonstrating and pt return demonstrating.  Short neck, 5  diaphragmatic breaths, R axillary nodes and establishment of interaxillary pathway, L inguinal nodes and establishment of axilloinguinal pathway, then L UE working proximal to distal, moving fluid from upper inner arm outwards, and doing both sides of forearm moving fluid towards pathways spending extra time in any areas of fibrosis then retracing all steps  and ending with LN's. Pt practiced each step after watching therapist. She required Max VC's and tactile cues initially to get proper stretch, but did exceptionally well after practicing with a more relaxed hand. Written handout for MLD given to pt. Pt has still not received gauntlet or night garment but [employer] did message to say it is with insurance. She has not yet heard from [employer]. Will wait until Monday before ordering garments to see if her gauntlet arrives over the weekend. Measure next, review MLD, Recert.  04/15/2024 Educated pt in standing theraband exercises including scapular retraction, shoulder extension and bilateral ER using good posture x 10 and updated HEP. Also performed supine horizontal abduction with yellow x 10. Discussed garments and pt would like 2 more sleeves, another gauntlet and a glove. Faxed script to Dr. Ebbie STM to Lt axilla at pect insertionT, then to lateral trunk where still tight with cocoa butter PROM to left shoulder flexion, scaption, abduction and ER with VC's prn to relax In supine: Short neck, superficial and deep abdominals, R axillary nodes and establishment of interaxillary pathway, L inguinal nodes and establishment of axilloinguinal pathway, then L UE working proximal to distal, moving fluid from upper inner arm outwards, and doing both sides of forearm, and hand moving fluid towards pathways spending extra time  in any areas of fibrosis then retracing all steps  Had pt use arm butler for donning her sleeve and used gloves to assist  Did very well overall.. Discussed teaching pt MLD in sitting next visit.        PATIENT EDUCATION:  04/15/2024 Standing Scapular retraction, bilateral shoulder ext and bilateral ER with yellow x 10. Emphasis on proper posture Education details: Supine scapular series Person educated: Patient Education method: Explanation, demonstration, tactile and VC's and handout issued Education comprehension: verbalized understanding, returned demo, tactile cues and pt will benefit from further review  HOME EXERCISE PROGRAM: Supine scapular series with yellow theraband, standing postureal exercises;SR, shoulder ext, bilateral eR Discontinue Supine exs except horizontal abd due to pain   CLINICAL IMPRESSION: Pt returned today with her new garments; She is able to don her sleeve independently and it fits well despite it not being as easy to don as her old sleeve since it is not custom and is now stretched out. The new gauntlet is bothering the DIP and MCP joint at her thumb and should be remade if it can be made slightly differently at the MCP joint due to her arthritis and shortened below the DIP joint. Her circumference measurements were excellent today. She will require an additional visit or 2 to check her new gauntlet when it can be made. She continues to have shoulder pain from arthritis.   OBJECTIVE IMPAIRMENTS: decreased activity tolerance, decreased knowledge of condition, decreased ROM, decreased strength, increased edema, impaired UE functional use, postural dysfunction, and pain.   ACTIVITY LIMITATIONS: lifting, sleeping, dressing, and reach over head  PARTICIPATION LIMITATIONS: cleaning and laundry  PERSONAL FACTORS:  Left breast Cancer, radiation are also patient's functional outcome.   REHAB POTENTIAL: Good  CLINICAL DECISION MAKING: Evolving/moderate  complexity  EVALUATION COMPLEXITY: Moderate  GOALS: Goals reviewed with patient? Yes  SHORT  TERM GOALS: Target date: 04/01/2024  Pt will be independent and compliant with HEP for left shoulder ROM/strength  Baseline: Goal status: MET  2.  Pt will be fit for appropriate flat knit compression sleeve to address left UE lymphedema Baseline:  Goal status: MET 04/08/2024 3.  Pt will have decreased left shoulder/axillary pain by 25% or more Baseline:  Goal status: MET 4.  Quick dash will improve to no greater than 30% to demonstrate improved function Baseline:  Goal status:In Progress; slightly worse today  LONG TERM GOALS: Target date: 04/22/2024  Pt will have decreased complaints left shoulder/axillary pain  by 50%  Baseline:  Goal status: In Progress 2.  Pt will have left shoulder ROM WNL as compared to right UE   Baseline:  Goal status: In Progress 3.  Pts quick dash will be no greater than 22% to demonstrate improved function in left shoulder Baseline:  Goal status: In Progress  4.  Pt will be educated in self MLD to decrease left UE swelling prn Baseline:  Goal status: MET 07/17/2024;pt is using her Nimble and doing MLD with it. 5; Pts new gauntlet will fit well and not increase thumb pain when wearing  Goal Status: Ordered, unknown outcome.  PLAN:  PT FREQUENCY: 2 visits prn over 6 weeks to check new gauntlet when it is remade  PT DURATION: 6  weeks  PLANNED INTERVENTIONS: 97164- PT Re-evaluation, 97110-Therapeutic exercises, 97530- Therapeutic activity, 97112- Neuromuscular re-education, 97535- Self Care, [id number]- Manual therapy, [phone]- Orthotic Fit/training, Patient/Family education, and Joint mobilization  PLAN FOR NEXT SESSION,:  check new gauntlet for fit/comfort, Review  Self MLD in sitting prn, remeasure UE, check Suppose to be Getting Nimble on 05/10/2024 RW 04/03/24 - Faxed demographics and PT note to [facility] and [facility] [employer] [facility]. Also faxed script to Dr  .Ebbie for the nighttime garment. VR 04/15/2024; messaged Leah re; flexi, Faxed script to Dr. Ebbie for garments to order through insurance  PHYSICAL THERAPY DISCHARGE SUMMARY  Visits from Start of Care: 21  Current functional level related to goals / functional outcomes: Met most  lymphedema goals, shoulder goals not MET   Remaining deficits: Shoulder pain   Education / Equipment: Compression pump (NIMBL), compression garments for day and night   Patient agrees to discharge. Patient goals were partially met. Patient is being discharged due to not returning since the last visit.   Grayce JINNY Sheldon, PT 07/17/2024, 3:38 PM  Over Head Pull: Narrow and Wide Grip   Cancer Rehab [phone]   On back, knees bent, feet flat, band across thighs, elbows straight but relaxed. Pull hands apart (start). Keeping elbows straight, bring arms up and over head, hands toward floor. Keep pull steady on band. Hold momentarily. Return slowly, keeping pull steady, back to start. Then do same with a wider grip on the band (past shoulder width) Repeat _5-10__ times. Band color __yellow____   Side Pull: Double Arm   On back, knees bent, feet flat. Arms perpendicular to body, shoulder level, elbows straight but relaxed. Pull arms out to sides, elbows straight. Resistance band comes across collarbones, hands toward floor. Hold momentarily. Slowly return to starting position. Repeat _5-10__ times. Band color _yellow____   Sword   On back, knees bent, feet flat, left hand on left hip, right hand above left. Pull right arm DIAGONALLY (hip to shoulder) across chest. Bring right arm along head toward floor. Hold momentarily. Slowly return to starting position. Repeat _5-10__ times. Do with left arm. Band color  _yellow_____   Shoulder Rotation: Double Arm   On back, knees bent, feet flat, elbows tucked at sides, bent 90, hands palms up. Pull hands apart and down toward floor, keeping elbows near sides.  Hold momentarily. Slowly return to starting position. Repeat _5-10__ times. Band color __yellow____

## 2024-07-29 ENCOUNTER — Ambulatory Visit

## 2024-08-13 ENCOUNTER — Telehealth: Payer: Self-pay | Admitting: Gastroenterology

## 2024-08-13 NOTE — Telephone Encounter (Signed)
 Inbound call from patient requesting to speak with nurse. States that she is having an emergency. Please advise.

## 2024-08-13 NOTE — Telephone Encounter (Signed)
 Pt tates she is only passing gas and when she does pass some small amt of stool it is diarrhea. She has been hesitant in the past to have a colon done. Now would like to be seen and possibly have colon done. Pt scheduled to see Delon Failing PA 08/15/24@10 :20am. Pt aware of appt.

## 2024-08-15 ENCOUNTER — Ambulatory Visit: Admitting: Physician Assistant

## 2024-08-15 ENCOUNTER — Ambulatory Visit
Admission: RE | Admit: 2024-08-15 | Discharge: 2024-08-15 | Disposition: A | Discharge status: Home / Self Care | Source: Ambulatory Visit | Attending: Physician Assistant

## 2024-08-15 ENCOUNTER — Encounter: Payer: Self-pay | Admitting: Physician Assistant

## 2024-08-15 VITALS — BP 108/64 | HR 94 | Ht 68.0 in | Wt 154.1 lb

## 2024-08-15 DIAGNOSIS — K219 Gastro-esophageal reflux disease without esophagitis: Secondary | ICD-10-CM | POA: Diagnosis not present

## 2024-08-15 DIAGNOSIS — K5909 Other constipation: Secondary | ICD-10-CM

## 2024-08-15 DIAGNOSIS — K5900 Constipation, unspecified: Secondary | ICD-10-CM | POA: Diagnosis not present

## 2024-08-15 DIAGNOSIS — K449 Diaphragmatic hernia without obstruction or gangrene: Secondary | ICD-10-CM

## 2024-08-15 MED ORDER — LUBIPROSTONE 24 MCG PO CAPS: 24.0000 ug | ORAL_CAPSULE | Freq: Two times a day (BID) | ORAL | 5 refills | Status: DC

## 2024-08-15 NOTE — Patient Instructions (Signed)
 Your provider has requested that you have an abdominal x ray before leaving today. Please go to the basement floor to our Radiology department for the test.  Suflave  sample provided. Follow instructions on back of the box to complete bowel purge.   We have sent the following medications to your pharmacy for you to pick up at your convenience: Amitiza  24 mcg twice daily.

## 2024-08-15 NOTE — Progress Notes (Signed)
 Chief Complaint: Constipation, discuss colonoscopy  HPI:    [name] is a 78 year old female, previously known to Dr. Obie now assigned to Dr. Abran, with a past medical history as listed below including brain aneurysm followed at [facility], complication of anesthesia following a colonoscopy with aspiration pneumonia, 11/17/2016 echo with LVEF 60-65% grade 1 diastolic dysfunction, GERD and multiple others, who was referred to me by Onita Rush, MD for a complaint of constipation and to discuss a colonoscopy.      03/25/11 EGD for GERD with hiatal hernia, stricture in the distal esophagus, 3 to 4 cm medium sized hiatal hernia chronic reflux.  Pathology showed normal mucosa.    12/25/12 colonoscopy for screening was normal.  Repeat recommended 10 years.  Patient developed aspiration pneumonia afterwards and was treated with a 10-day course of Levaquin .    01/26/2021 CTAP without contrast showed a moderate hiatal hernia with intrathoracic position of the gastric fundus.  Status post left inguinal hernia repair.    12/11/2023 CBC normal.    01/18/2024 patient seen by Con Blower, PA-C for positive hemosure.  At that time reported chronic constipation on Linzess 290 mcg daily.  GERD controlled on Nexium  daily.  At that point patient hesitant to proceed with colonoscopy and repeat FOB was done.  Linzess 290 was recommended plus MiraLAX .    02/21/2024 fecal occult study negative.    Today, the patient presents to clinic and tells me that she has had worsened constipation over the past few months telling me that she feels like she has to go to the bathroom and passes mostly just gas and maybe a tablespoon of stool.  She really cannot remember the last time that she had a good bowel movement.  She tried an enema with barely any relief yesterday.  She has stopped taking Linzess 290 mcg because it was not working at all and has just been taking MiraLAX  daily.  Tells me she has always had constipation but this is  worse.    Denies fever, chills, weight loss, nausea, vomiting or symptoms that awaken her from sleep.  Past Medical History:  Diagnosis Date   Arthritis    hands and knees   Complication of anesthesia    aspiration pna; following a colonoscopy, pt requests head of bed elevated if possible.   Constipation    Cough    Depression    Dysrhythmia    RBBB on 06-06-17 ekg    Elevated liver function tests    Esophageal stricture    Family history of breast cancer    Gallstones    Genetic testing 04/02/2018   STAT Breast panel with reflex to Multi-Cancer panel (83 genes) @ [facility] - No pathogenic mutations detected   GERD (gastroesophageal reflux disease)    Hiatal hernia    History of kidney stones    History of radiation therapy 07/09/2018- 08/03/18   Left Breast/ 40.05 Gy in 15 fractions. Left Breast boost 10 Gy in 5 fractions.    Hyperlipemia    Hypothyroidism    Insomnia    Malignant neoplasm of upper-outer quadrant of left female breast [facility])    Nephrolithiasis    Personal history of radiation therapy    Pneumonia 2015   aspirated after colonosopy   Renal cyst     Past Surgical History:  Procedure Laterality Date   BREAST BIOPSY Left 05/31/2019   mri   BREAST BIOPSY Left 04/2019   malignant   BREAST LUMPECTOMY Left 04/17/2018  malignant   BREAST LUMPECTOMY WITH RADIOACTIVE SEED AND SENTINEL LYMPH NODE BIOPSY Left 04/17/2018   Procedure: LEFT BREAST LUMPECTOMY WITH BRACKETED RADIOACTIVE SEEDS AND SENTINEL LYMPH NODE BIOPSY;  Surgeon: Ebbie Cough, MD;  Location: [facility];  Service: General;  Laterality: Left;   BREAST SURGERY Left    Lumpectomy   CARDIOVASCULAR STRESS TEST  07/19/2006   EF 70%, NO EVIDENCE OF ISCHEMIA   CHOLECYSTECTOMY  12/30/10   COLONOSCOPY     DILATION AND CURETTAGE OF UTERUS     HYSTEROSCOPY WITH D & C N/A 06/07/2018   Procedure: DILATATION AND CURETTAGE /HYSTEROSCOPY;  Surgeon: Mat Browning, MD;  Location: [facility];  Service:  Gynecology;  Laterality: N/A;   inguinal herniography     NASAL SINUS SURGERY     RADIOACTIVE SEED GUIDED EXCISIONAL BREAST BIOPSY Right 04/17/2018   Procedure: RIGHT BREAST SEED GUIDED EXCISIONAL BIOPSY;  Surgeon: Ebbie Cough, MD;  Location: [facility];  Service: General;  Laterality: Right;   RE-EXCISION OF BREAST LUMPECTOMY Left 05/15/2018   Procedure: RE-EXCISION OF LEFT BREAST LUMPECTOMY, ASPIRATION OF LEFT AXILLARY SEROMA;  Surgeon: Ebbie Cough, MD;  Location:  SURGERY CENTER;  Service: General;  Laterality: Left;   T & A     TOOTH EXTRACTION     TOTAL KNEE ARTHROPLASTY Left 09/11/2017   Procedure: LEFT TOTAL KNEE ARTHROPLASTY;  Surgeon: Melodi Lerner, MD;  Location: [facility];  Service: Orthopedics;  Laterality: Left;  with block   US  ECHOCARDIOGRAPHY  07/17/2006   EF 55-60%    Current Outpatient Medications  Medication Sig Dispense Refill   aspirin  81 MG EC tablet Take by mouth.     azelastine  (ASTELIN ) 0.1 % nasal spray Place 1 spray into both nostrils 2 (two) times daily. Use in each nostril as directed     Cholecalciferol (VITAMIN D3) 1000 units CAPS Take 1 capsule by mouth 2 (two) times daily.      diclofenac Sodium (VOLTAREN) 1 % GEL diclofenac 1 % topical gel  apply 2 grams to affected area two to three times a day     esomeprazole  (NEXIUM ) 40 MG capsule Take 40 mg by mouth 2 (two) times daily before a meal.     ezetimibe  (ZETIA ) 10 MG tablet Take 10 mg by mouth at bedtime.     fluticasone  (FLONASE ) 50 MCG/ACT nasal spray Place 1 spray into both nostrils daily.     levothyroxine  (SYNTHROID ) 50 MCG tablet Take by mouth.     linaclotide (LINZESS) 290 MCG CAPS capsule      Magnesium  250 MG TABS      OVER THE COUNTER MEDICATION Apply 1 application  topically daily as needed (pain). Natural pain relief ointment (Patient not taking: Reported on 04/19/2024)     propranolol  (INDERAL ) 10 MG tablet TAKE 1 TABLET TWICE A DAY 180 tablet 1   rosuvastatin   (CRESTOR ) 5 MG tablet Take 5 mg by mouth at bedtime.      SUPER B COMPLEX/C PO Take 1 tablet by mouth daily.     tretinoin (RETIN-A) 0.1 % cream      venlafaxine XR (EFFEXOR XR) 75 MG 24 hr capsule Take 75 mg by mouth daily with breakfast.     Vitamins-Lipotropics (COMPLEX B-100-INOSITOL) TBCR Take 1 tablet by mouth daily. (Patient not taking: Reported on 04/19/2024)     zolpidem  (AMBIEN ) 10 MG tablet zolpidem  10 mg tablet  take 1 tablet by mouth at bedtime if needed     No current  facility-administered medications for this visit.    Allergies as of 08/15/2024 - Review Complete 07/17/2024  Allergen Reaction Noted   Oxycontin  [oxycodone  hcl]  09/04/2017   Codeine Nausea And Vomiting     Family History  Problem Relation Age of Onset   Breast cancer Mother 62       deceased 26; TAH/BSO in 89s/50s   Dementia Father        deceased 21   Breast cancer Sister    Breast cancer Maternal Aunt 23       deceased 85s   Colon cancer Neg Hx    Stomach cancer Neg Hx    Esophageal cancer Neg Hx    Liver cancer Neg Hx     Social History   Socioeconomic History   Marital status: Married    Spouse name: Not on file   Number of children: 2   Years of education: Not on file   Highest education level: Not on file  Occupational History   Occupation: Retired    Associate Professor: [employer]  Tobacco Use   Smoking status: Never   Smokeless tobacco: Never  Vaping Use   Vaping status: Never Used  Substance and Sexual Activity   Alcohol  use: No   Drug use: No   Sexual activity: Not on file  Other Topics Concern   Not on file  Social History Narrative   Not on file   Social Drivers of Health   Financial Resource Strain: Low Risk  (04/16/2024)   Received from [facility]   Overall Financial Resource Strain (CARDIA)    Difficulty of Paying Living Expenses: Not hard at all  Food Insecurity: No Food Insecurity (04/16/2024)   Received from [facility]   Hunger Vital Sign    Within the past 12  months, you worried that your food would run out before you got the money to buy more.: Never true    Within the past 12 months, the food you bought just didn't last and you didn't have money to get more.: Never true  Transportation Needs: No Transportation Needs (04/16/2024)   Received from [facility] - Transportation    Lack of Transportation (Medical): No    Lack of Transportation (Non-Medical): No  Physical Activity: Insufficiently Active (04/16/2024)   Received from [facility]   Exercise Vital Sign    On average, how many days per week do you engage in moderate to strenuous exercise (like a brisk walk)?: 1 day    On average, how many minutes do you engage in exercise at this level?: 20 min  Stress: No Stress Concern Present (04/16/2024)   Received from [facility] - Occupational Stress Questionnaire    Feeling of Stress : Not at all  Social Connections: Socially Integrated (04/16/2024)   Received from [facility]   Social Network    How would you rate your social network (family, work, friends)?: Good participation with social networks  Intimate Partner Violence: Not At Risk (04/16/2024)   Received from [facility]   HITS    Over the last 12 months how often did your partner physically hurt you?: Never    Over the last 12 months how often did your partner insult you or talk down to you?: Never    Over the last 12 months how often did your partner threaten you with physical harm?: Never    Over the last 12 months how often did your partner  scream or curse at you?: Never    Review of Systems:    Constitutional: No weight loss, fever or chills Cardiovascular: No chest pain  Respiratory: No SOB  Gastrointestinal: See HPI and otherwise negative   Physical Exam:  Vital signs: BP 108/64 (BP Location: Right Arm, Patient Position: Sitting, Cuff Size: Normal)   Pulse 94   Ht 5' 8 (1.727 m)   Wt 154 lb 2 oz (69.9 kg)   BMI  23.43 kg/m    Constitutional:   Pleasant elderly Caucasian female appears to be in NAD, Well developed, Well nourished, alert and cooperative Respiratory: Respirations even and unlabored. Lungs clear to auscultation bilaterally.   No wheezes, crackles, or rhonchi.  Cardiovascular: Normal S1, S2. No MRG. Regular rate and rhythm. No peripheral edema, cyanosis or pallor.  Gastrointestinal:  Soft, nondistended, mild generalized TTP, no rebound or guarding. Normal bowel sounds. No appreciable masses or hepatomegaly. Rectal:  Not performed.  Psychiatric: Oriented to person, place and time. Demonstrates good judgement and reason without abnormal affect or behaviors.  RELEVANT LABS AND IMAGING: CBC    Component Value Date/Time   WBC 7.7 02/03/2022 1419   RBC 4.34 02/03/2022 1419   HGB 12.9 02/03/2022 1419   HGB 12.9 11/26/2019 1024   HGB 13.4 06/06/2017 1448   HCT 39.7 02/03/2022 1419   HCT 42.4 06/06/2017 1448   PLT 202 02/03/2022 1419   PLT 190 11/26/2019 1024   PLT 298 06/06/2017 1448   MCV 91.5 02/03/2022 1419   MCV 92 06/06/2017 1448   MCH 29.7 02/03/2022 1419   MCHC 32.5 02/03/2022 1419   RDW 13.7 02/03/2022 1419   RDW 14.5 06/06/2017 1448   LYMPHSABS 3.1 02/03/2022 1419   MONOABS 0.7 02/03/2022 1419   EOSABS 0.5 02/03/2022 1419   BASOSABS 0.1 02/03/2022 1419    CMP     Component Value Date/Time   NA 141 02/03/2022 1419   NA 144 06/06/2017 1448   K 4.2 02/03/2022 1419   CL 107 02/03/2022 1419   CO2 30 02/03/2022 1419   GLUCOSE 97 02/03/2022 1419   BUN 17 02/03/2022 1419   BUN 12 06/06/2017 1448   CREATININE 0.77 02/03/2022 1419   CREATININE 0.80 11/26/2019 1024   CALCIUM  10.2 02/03/2022 1419   PROT 6.4 (L) 02/03/2022 1419   PROT 6.3 06/06/2017 1448   ALBUMIN 4.4 02/03/2022 1419   ALBUMIN 4.4 06/06/2017 1448   AST 57 (H) 02/03/2022 1419   AST 45 (H) 11/26/2019 1024   ALT 49 (H) 02/03/2022 1419   ALT 31 11/26/2019 1024   ALKPHOS 90 02/03/2022 1419   BILITOT 0.6  02/03/2022 1419   BILITOT 0.7 11/26/2019 1024   GFRNONAA >60 02/03/2022 1419   GFRNONAA >60 11/26/2019 1024   GFRAA >60 06/29/2020 1237   GFRAA >60 11/26/2019 1024    Assessment: 1.  Chronic constipation: Worsened over the past few weeks, last colonoscopy in 2014 with aspiration pneumonia afterwards which she tells me is due to her large hiatal hernia and then laying her flat on her back, due to this worried about having another colonoscopy, has tried enemas, Linzess 290 stopped working so she stopped using and is just been taking MiraLAX  and tried an enema yesterday, no real movement over the past many weeks per her, she is passing gas and tiny bits of stool, only minimal bloating, on exam abdomen soft, bowel sounds in all 4 quadrants, minimal discomfort, Hemoccult negative in March, some mild generalized TTP on exam today;  consider worsened chronic constipation caused by slow transit +/- IBS-C vs mechanical obstruction 2.  Hiatal hernia: Moderate sized on CT in 2022, also noted on EGD in 2012 is medium sized 3.  GERD: Well-controlled on Nexium   Plan: 1.  Discussed options with the patient today.  Thankfully on exam I do hear bowel sounds in all 4 quadrants and she is not extremely bloated, her abdomen is soft.  I think first we need to get her moving again.  I provided her with a Suflave  bowel prep sample so she can complete this slowly over the next 2 days.  (Worried about taking in large volumes given her hiatal hernia). 2.  Ordered a two-view abdominal x-ray today. 3.  Started Amitiza  24 mcg twice a day.  Prescribed #60 with 5 refills. 4.  Patient will have close follow-up with me in the next 4 to 6 weeks.  At that point we can discuss how she is doing and possibly discuss a colonoscopy (with her head elevated) versus Cologuard if she is still worried about this.  Delon Failing, PA-C [facility] 08/15/2024, 9:13 AM  Cc: Onita Rush, MD

## 2024-08-15 NOTE — Progress Notes (Signed)
 Noted

## 2024-08-16 ENCOUNTER — Telehealth: Payer: Self-pay | Admitting: Physician Assistant

## 2024-08-16 NOTE — Telephone Encounter (Signed)
 Called the patient back and offered. She said no thank you.

## 2024-08-16 NOTE — Telephone Encounter (Signed)
 Patient took lubiprostone  with her supper last night at 7 pm. Reports she was up all night with diarrhea, shortness of breath and It was a nightmare. I will not be taking that again. She plans to focus on hydrating today. She will start the bowel prep tomorrow and complete it over a 2 day span. She wanted her provider to know she did not tolerate lubiprostone .

## 2024-08-16 NOTE — Telephone Encounter (Signed)
 Inbound call from patient requesting a call from nurse to discuss concerns she has regarding lubiprostone . Please advise, thank you

## 2024-08-23 DIAGNOSIS — G8929 Other chronic pain: Secondary | ICD-10-CM | POA: Diagnosis not present

## 2024-08-23 DIAGNOSIS — M25562 Pain in left knee: Secondary | ICD-10-CM | POA: Diagnosis not present

## 2024-08-26 ENCOUNTER — Ambulatory Visit: Payer: Self-pay | Admitting: Physician Assistant

## 2024-08-29 DIAGNOSIS — I729 Aneurysm of unspecified site: Secondary | ICD-10-CM | POA: Diagnosis not present

## 2024-09-03 ENCOUNTER — Ambulatory Visit (INDEPENDENT_AMBULATORY_CARE_PROVIDER_SITE_OTHER): Admitting: Otolaryngology

## 2024-09-05 DIAGNOSIS — H9313 Tinnitus, bilateral: Secondary | ICD-10-CM | POA: Diagnosis not present

## 2024-09-05 DIAGNOSIS — H90A22 Sensorineural hearing loss, unilateral, left ear, with restricted hearing on the contralateral side: Secondary | ICD-10-CM | POA: Diagnosis not present

## 2024-09-05 DIAGNOSIS — Z23 Encounter for immunization: Secondary | ICD-10-CM | POA: Diagnosis not present

## 2024-09-05 DIAGNOSIS — H90A31 Mixed conductive and sensorineural hearing loss, unilateral, right ear with restricted hearing on the contralateral side: Secondary | ICD-10-CM | POA: Diagnosis not present

## 2024-09-05 DIAGNOSIS — H6991 Unspecified Eustachian tube disorder, right ear: Secondary | ICD-10-CM | POA: Diagnosis not present

## 2024-09-11 DIAGNOSIS — H9311 Tinnitus, right ear: Secondary | ICD-10-CM | POA: Diagnosis not present

## 2024-09-11 DIAGNOSIS — H04123 Dry eye syndrome of bilateral lacrimal glands: Secondary | ICD-10-CM | POA: Diagnosis not present

## 2024-10-01 ENCOUNTER — Ambulatory Visit: Admitting: Physician Assistant

## 2024-10-04 DIAGNOSIS — H9313 Tinnitus, bilateral: Secondary | ICD-10-CM | POA: Diagnosis not present

## 2024-10-04 DIAGNOSIS — H90A31 Mixed conductive and sensorineural hearing loss, unilateral, right ear with restricted hearing on the contralateral side: Secondary | ICD-10-CM | POA: Diagnosis not present

## 2024-10-04 DIAGNOSIS — H6991 Unspecified Eustachian tube disorder, right ear: Secondary | ICD-10-CM | POA: Diagnosis not present

## 2024-11-04 ENCOUNTER — Telehealth: Payer: Self-pay

## 2024-11-04 NOTE — Telephone Encounter (Signed)
 S/w patient regarding clarification of upcoming appointment. Patient made aware that she is scheduled for follow-up appointment on 01/18/24 at 10 AM.  Patient confirmed upcoming appointment date and time. Patient also had questions regarding mammogram scheduling. Patient aware that next mammogram can be scheduled after 01/08/25. Clarified that this was to ensure that insurance would pay for it as an annual mammogram.  Patient had multiple questions regarding mammogram. All questions answered at time of call. Patient provided with [facility] for [facility] to schedule appointment.

## 2024-11-13 ENCOUNTER — Ambulatory Visit: Admitting: Gastroenterology

## 2024-11-22 DIAGNOSIS — E785 Hyperlipidemia, unspecified: Secondary | ICD-10-CM | POA: Diagnosis not present

## 2024-11-22 DIAGNOSIS — Z1212 Encounter for screening for malignant neoplasm of rectum: Secondary | ICD-10-CM | POA: Diagnosis not present

## 2024-11-22 LAB — LAB REPORT - SCANNED: EGFR: 69.4

## 2024-11-22 LAB — HEMOGLOBIN A1C: A1c: 5.4

## 2024-12-03 ENCOUNTER — Telehealth: Payer: Self-pay

## 2024-12-03 NOTE — Telephone Encounter (Signed)
 Called pt in response to her email received today indicating she has not received her 2 sleeves and 1 hand piece ordered in August. Advised pt that I again emailed [employer] again today  and had a response back from [employer] that they have been held up in customs due to [place] shut down. Each indicated they are on it and very apologetic. Discussed with pt and apologized. It has been nearly 4 months for the hand piece remake. I emailed each of them that since her sleeves were received 6 months ago can they order 3 sleeves and hand pieces so this doesn't happen again. Awaiting response from them. Advised pt to call me in 2 weeks if she has not had any response so I can follow up.

## 2024-12-13 ENCOUNTER — Telehealth: Payer: Self-pay

## 2024-12-13 NOTE — Telephone Encounter (Signed)
 Returned pt call back. Pt states that she has been trying to get her diagnose mammogram scheduled and have not heard anything yet. Please get this scheduled for pt.

## 2024-12-20 ENCOUNTER — Ambulatory Visit: Admitting: Gastroenterology

## 2024-12-20 ENCOUNTER — Encounter: Payer: Self-pay | Admitting: Gastroenterology

## 2024-12-20 VITALS — BP 108/70 | HR 83 | Ht 68.0 in | Wt 151.6 lb

## 2024-12-20 DIAGNOSIS — K449 Diaphragmatic hernia without obstruction or gangrene: Secondary | ICD-10-CM | POA: Diagnosis not present

## 2024-12-20 DIAGNOSIS — K219 Gastro-esophageal reflux disease without esophagitis: Secondary | ICD-10-CM | POA: Diagnosis not present

## 2024-12-20 DIAGNOSIS — R1030 Lower abdominal pain, unspecified: Secondary | ICD-10-CM | POA: Diagnosis not present

## 2024-12-20 DIAGNOSIS — K5909 Other constipation: Secondary | ICD-10-CM

## 2024-12-20 MED ORDER — TRULANCE 3 MG PO TABS: 3.0000 mg | ORAL_TABLET | Freq: Every day | ORAL | Status: AC

## 2024-12-20 NOTE — Progress Notes (Signed)
 "  [name] [id number] 1946/08/23   Chief Complaint: Constipation  Referring Provider: Onita Rush, MD Primary GI MD: Dr. Abran  HPI: [name] is a 79 y.o. female  previously known to Dr. Obie now assigned to Dr. Abran, with a past medical history as listed below including brain aneurysm followed at [facility], complication of anesthesia following a colonoscopy with aspiration pneumonia, 11/17/2016 echo with LVEF 60-65% grade 1 diastolic dysfunction, GERD and multiple others who presents today for follow up.    03/25/11 EGD for GERD with hiatal hernia, stricture in the distal esophagus, 3 to 4 cm medium sized hiatal hernia chronic reflux.  Pathology showed normal mucosa.     12/25/12 colonoscopy for screening was normal.  Repeat recommended 10 years.  Patient developed aspiration pneumonia afterwards and was treated with a 10-day course of Levaquin .     01/26/2021 CTAP without contrast showed a moderate hiatal hernia with intrathoracic position of the gastric fundus.  Status post left inguinal hernia repair.     12/11/2023 CBC normal.     01/18/2024 patient seen by Con Blower, PA-C for positive hemosure.  At that time reported chronic constipation on Linzess 290 mcg daily.  GERD controlled on Nexium  daily.  At that point patient hesitant to proceed with colonoscopy and repeat FOB was done.  Linzess 290 was recommended plus MiraLAX .     02/21/2024 fecal occult study negative.  Last seen in office 08/15/2024 by Delon Failing, PA.  At that time endorsed chronic constipation which had worsened over the past few weeks, last colonoscopy 2014 with aspiration pneumonia afterwards which she attributed to large hiatal hernia.  Was concerned about having another colonoscopy, had tried enemas and Linzess 290 stopped working so she stopped using it had been using MiraLAX .  No significant bowel movement over the past several weeks, though was passing gas and small bits of stool, having some  minimal bloating, abdomen soft on exam. GERD well-controlled on Nexium .  Plan at last visit was to use Suflave  bowel prep sample to complete slowly over the next 2 days, KUB ordered, and patient started on Amitiza  24 mcg twice daily.  Plan for close follow-up to discuss potential colonoscopy (with her head elevated) versus Cologuard if she is still concerned about colonoscopy.  Patient called and stated she took Amitiza  and was up all night with diarrhea, shortness of breath.  Was planning to start the bowel prep the next day and complete over 2-day span.  Was given option of decreasing dose of Amitiza  to 8 mcg twice daily instead but patient declined.  KUB came back showing stool throughout the colon but no obstruction.  Patient stated that she did 1 bottle of the bowel prep and restarted Linzess which seemed to be working again.   Discussed the use of AI scribe software for clinical note transcription with the patient, who gave verbal consent to proceed.  History of Present Illness [name] is a 79 year old female with chronic constipation and large hiatal hernia who presents for evaluation of persistent constipation.  Constipation and Lower Abdominal Pain: - Lifelong constipation with recent worsening; intervals up to five days without bowel movement - Last bowel movement occurred two to three days ago after use of over-the-counter suppository - Linzess at maximum dose provided relief for one week but is no longer effective - Previously trialed Miralax ; Amitiza  has been suggested but not yet used - No blood in stool - Intermittent lower abdominal pain, including  last night, not relieved by bowel movements - Prior abdominal x-ray showed significant constipation without evidence of bowel obstruction  Gastroesophageal Reflux and Hiatal Hernia: - Large hiatal hernia with imaging showing half of stomach herniated into chest cavity - Cautious with diet, especially at night, due  to previous episodes of vomiting and reflux - Fear of eating at night - Nexium  taken for reflux, which is currently controlled - No heartburn or food impaction - Cough occurs randomly and when lying down at night  Colorectal Cancer Screening and Gastrointestinal Evaluation: - Colonoscopy in 2014 complicated by post-procedure aspiration and pneumonia - Stool test for blood in March of previous year was negative - Cologuard test completed; result not available - Upper endoscopy performed in the past; date not specified  Genitourinary Symptoms Related to Constipation: - Blood in urine in 2022, attributed to straining from constipation - CT scan performed at that time  Pulmonary and Oncologic History: - History of breast cancer treated with radiation - Prior pneumonia - Scar tissue in left lung - Brain aneurysm; undergoes brain scans with IV contrast every six months   Patient brought labs from PCP: Normal lipid panel, CBC, CMP   Previous GI Procedures/Imaging   Colonoscopy 12/25/2012 (Dr. Obie) Normal colon  Recall 10 years   Past Medical History:  Diagnosis Date   Arthritis    hands and knees   Complication of anesthesia    aspiration pna; following a colonoscopy, pt requests head of bed elevated if possible.   Constipation    Cough    Depression    Dysrhythmia    RBBB on 06-06-17 ekg    Elevated liver function tests    Esophageal stricture    Family history of breast cancer    Gallstones    Genetic testing 04/02/2018   STAT Breast panel with reflex to Multi-Cancer panel (83 genes) @ [facility] - No pathogenic mutations detected   GERD (gastroesophageal reflux disease)    Hiatal hernia    History of kidney stones    History of radiation therapy 07/09/2018- 08/03/18   Left Breast/ 40.05 Gy in 15 fractions. Left Breast boost 10 Gy in 5 fractions.    Hyperlipemia    Hypothyroidism    Insomnia    Malignant neoplasm of upper-outer quadrant of left female breast (HCC)     Nephrolithiasis    Personal history of radiation therapy    Pneumonia 2015   aspirated after colonosopy   Renal cyst     Past Surgical History:  Procedure Laterality Date   BREAST BIOPSY Left 05/31/2019   mri   BREAST BIOPSY Left 04/2019   malignant   BREAST LUMPECTOMY Left 04/17/2018   malignant   BREAST LUMPECTOMY WITH RADIOACTIVE SEED AND SENTINEL LYMPH NODE BIOPSY Left 04/17/2018   Procedure: LEFT BREAST LUMPECTOMY WITH BRACKETED RADIOACTIVE SEEDS AND SENTINEL LYMPH NODE BIOPSY;  Surgeon: Ebbie Cough, MD;  Location: [facility];  Service: General;  Laterality: Left;   BREAST SURGERY Left    Lumpectomy   CARDIOVASCULAR STRESS TEST  07/19/2006   EF 70%, NO EVIDENCE OF ISCHEMIA   CHOLECYSTECTOMY  12/30/10   COLONOSCOPY     DILATION AND CURETTAGE OF UTERUS     HYSTEROSCOPY WITH D & C N/A 06/07/2018   Procedure: DILATATION AND CURETTAGE /HYSTEROSCOPY;  Surgeon: Mat Browning, MD;  Location: [facility];  Service: Gynecology;  Laterality: N/A;   inguinal herniography     NASAL SINUS SURGERY     RADIOACTIVE SEED GUIDED  EXCISIONAL BREAST BIOPSY Right 04/17/2018   Procedure: RIGHT BREAST SEED GUIDED EXCISIONAL BIOPSY;  Surgeon: Ebbie Cough, MD;  Location: [facility];  Service: General;  Laterality: Right;   RE-EXCISION OF BREAST LUMPECTOMY Left 05/15/2018   Procedure: RE-EXCISION OF LEFT BREAST LUMPECTOMY, ASPIRATION OF LEFT AXILLARY SEROMA;  Surgeon: Ebbie Cough, MD;  Location: [facility];  Service: General;  Laterality: Left;   T & A     TOOTH EXTRACTION     TOTAL KNEE ARTHROPLASTY Left 09/11/2017   Procedure: LEFT TOTAL KNEE ARTHROPLASTY;  Surgeon: Melodi Lerner, MD;  Location: [facility];  Service: Orthopedics;  Laterality: Left;  with block   US  ECHOCARDIOGRAPHY  07/17/2006   EF 55-60%    Current Outpatient Medications  Medication Sig Dispense Refill   aspirin  81 MG EC tablet Take by mouth.     azelastine  (ASTELIN ) 0.1 %  nasal spray Place 1 spray into both nostrils 2 (two) times daily. Use in each nostril as directed     Cholecalciferol (VITAMIN D3) 1000 units CAPS Take 1 capsule by mouth 2 (two) times daily.      diclofenac Sodium (VOLTAREN) 1 % GEL diclofenac 1 % topical gel  apply 2 grams to affected area two to three times a day     esomeprazole  (NEXIUM ) 40 MG capsule Take 40 mg by mouth 2 (two) times daily before a meal.     ezetimibe  (ZETIA ) 10 MG tablet Take 10 mg by mouth at bedtime.     fluticasone  (FLONASE ) 50 MCG/ACT nasal spray Place 1 spray into both nostrils daily.     levothyroxine  (SYNTHROID ) 50 MCG tablet Take by mouth.     linaclotide (LINZESS) 290 MCG CAPS capsule Take 290 mcg by mouth daily before breakfast.     Magnesium  250 MG TABS      meclizine (ANTIVERT) 25 MG tablet Take 25 mg by mouth 2 (two) times daily as needed for dizziness.     OVER THE COUNTER MEDICATION Apply 1 application  topically daily as needed (pain). Natural pain relief ointment     propranolol  (INDERAL ) 10 MG tablet TAKE 1 TABLET TWICE A DAY 180 tablet 1   rosuvastatin  (CRESTOR ) 5 MG tablet Take 5 mg by mouth at bedtime.      SUPER B COMPLEX/C PO Take 1 tablet by mouth daily.     tretinoin (RETIN-A) 0.1 % cream      venlafaxine XR (EFFEXOR XR) 75 MG 24 hr capsule Take 75 mg by mouth daily with breakfast.     zolpidem  (AMBIEN ) 10 MG tablet zolpidem  10 mg tablet  take 1 tablet by mouth at bedtime if needed     Vitamins-Lipotropics (COMPLEX B-100-INOSITOL) TBCR Take 1 tablet by mouth daily. (Patient not taking: Reported on 12/20/2024)     No current facility-administered medications for this visit.    Allergies as of 12/20/2024 - Review Complete 12/20/2024  Allergen Reaction Noted   Oxycontin  [oxycodone  hcl]  09/04/2017   Codeine Nausea And Vomiting     Family History  Problem Relation Age of Onset   Breast cancer Mother 57       deceased 51; TAH/BSO in 25s/50s   Dementia Father        deceased 21   Breast  cancer Sister    Breast cancer Maternal Aunt 26       deceased 15s   Colon cancer Neg Hx    Stomach cancer Neg Hx    Esophageal cancer Neg Hx  Liver cancer Neg Hx     Social History[1]   Review of Systems:    Constitutional: No fever, chills, weakness  Cardiovascular: No chest pain Respiratory: No SOB  Gastrointestinal: See HPI and otherwise negative   Physical Exam:  Vital signs: BP 108/70   Pulse 83   Ht 5' 8 (1.727 m)   Wt 151 lb 9.6 oz (68.8 kg)   BMI 23.05 kg/m   Wt Readings from Last 3 Encounters:  12/20/24 151 lb 9.6 oz (68.8 kg)  08/15/24 154 lb 2 oz (69.9 kg)  04/19/24 158 lb 9.6 oz (71.9 kg)     Constitutional: Pleasant, well-appearing female in NAD, alert and cooperative Head:  Normocephalic and atraumatic.  Respiratory: Respirations even and unlabored. Lungs clear to auscultation bilaterally.  No wheezes, crackles, or rhonchi.  Cardiovascular:  Regular rate and rhythm. No murmurs. No peripheral edema. Gastrointestinal:  Soft, nondistended, mild tenderness to palpation lower abdomen. No rebound or guarding. Normal bowel sounds. No appreciable masses or hepatomegaly. Rectal:  Not performed.  Neurologic:  Alert and oriented x4;  grossly normal neurologically.  Skin:   Dry and intact without significant lesions or rashes. Psychiatric: Oriented to person, place and time. Demonstrates good judgement and reason without abnormal affect or behaviors.   Assessment/Plan:   Assessment & Plan  Chronic constipation Lower abdominal pain Lifelong refractory constipation with significant stool burden, Linzess 290 mcg somewhat helpful the patient states it works for a week at a time and stops working.  No acute obstruction.    Per chart review, patient was started on Amitiza  24 mcg twice daily at last visit with Delon Failing, PA, and was also given Suflave  bowel prep.  Patient adamantly denies taking either of these.  Says she has only continued to take Linzess.   Also uses OTC suppositories as needed.  Reports she has been told that she cannot have colonoscopy based on her history of aspiration pneumonia with colonoscopy in 2014.  States that a doctor here told her she could not have a colonoscopy.  I do not see any record of colonoscopy being deemed not an option, looks like there has been talk of having her undergo colonoscopy with her head elevated.  Patient states she was told she cannot have a colonoscopy without lying flat.  Recommend she follow-up with Dr. Abran to discuss this directly.  In the meantime we will give her samples of Trulance  to avoid recurrence of side effects she experienced on Amitiza , which is documented in the chart though she denies.  - Samples of Trulance  given - Could consider lower dose Amitiza  if Trulance  not effective - Counseled on diarrhea as a side effect, advised medication change in persistent - Consider CT abdomen and pelvis to exclude other pathology if we ultimately do not proceed with Cologuard or colonoscopy.  Large hiatal hernia with gastroesophageal reflux History of large hiatal hernia.  Reflux controlled on Nexium , but nocturnal cough persists. No dysphagia or food impaction.  Patient concerned about her hiatal hernia and would like to follow-up on this.  Discussed options for UGI series versus EGD.  Will start with UGI series and consider EGD if symptoms persist or worsen or if concerning findings on imaging.  - Ordered upper GI series to evaluate hiatal hernia and rule out structural abnormalities. - Advised upper endoscopy if upper GI series shows concerning findings. - Scheduled follow-up with Dr. Abran to review imaging and discuss management.   Camie Furbish, PA-C [facility] 12/20/2024, 2:09 PM  Patient Care Team: Onita Rush, MD as PCP - General Pietro, Redell RAMAN, MD as PCP - Cardiology (Cardiology) Ebbie Cough, MD as Consulting Physician (General Surgery) Izell Domino, MD as  Attending Physician (Radiation Oncology) Pietro Redell RAMAN, MD as Consulting Physician (Cardiology) Mat Browning, MD as Consulting Physician (Obstetrics and Gynecology) Deanie Rush ORN, PT as Physical Therapist (Physical Therapy) Georgina Lavelle BROCKS., MD as Referring Physician (Sports Medicine) Rosslyn Dino HERO, MD as Physician Assistant (Neurosurgery) Loretha Ash, MD as Consulting Physician (Hematology and Oncology)       [1]  Social History Tobacco Use   Smoking status: Never   Smokeless tobacco: Never  Vaping Use   Vaping status: Never Used  Substance Use Topics   Alcohol  use: No   Drug use: No   "

## 2024-12-20 NOTE — Progress Notes (Signed)
 Noted

## 2024-12-20 NOTE — Patient Instructions (Addendum)
 We have given you samples of the following medication to take: Trulance   Take one once daily for constipation  Let us  know if this is helpful and we can send a prescription.  You have been scheduled for an Upper GI Series at [facility]. Your appointment is on Thursday January 02, 2025 at 10:00 am. Please arrive 30 minutes prior to your test for registration. Make sure not to eat or drink anything after midnight on the night before your test. If you need to reschedule, please call radiology at [phone]. ________________________________________________________________ An upper GI series uses x rays to help diagnose problems of the upper GI tract, which includes the esophagus, stomach, and duodenum. The duodenum is the first part of the small intestine. An upper GI series is conducted by a radiology technologist or a radiologist--a doctor who specializes in x-ray imaging--at a hospital or outpatient center. While sitting or standing in front of an x-ray machine, the patient drinks barium liquid, which is often white and has a chalky consistency and taste. The barium liquid coats the lining of the upper GI tract and makes signs of disease show up more clearly on x rays. X-ray video, called fluoroscopy, is used to view the barium liquid moving through the esophagus, stomach, and duodenum. Additional x rays and fluoroscopy are performed while the patient lies on an x-ray table. To fully coat the upper GI tract with barium liquid, the technologist or radiologist may press on the abdomen or ask the patient to change position. Patients hold still in various positions, allowing the technologist or radiologist to take x rays of the upper GI tract at different angles. If a technologist conducts the upper GI series, a radiologist will later examine the images to look for problems.  This test typically takes about 1 hour to complete.  _______________________________________________________  If your blood  pressure at your visit was 140/90 or greater, please contact your primary care physician to follow up on this.  _______________________________________________________  If you are age 79 or older, your body mass index should be between 23-30. Your Body mass index is 23.05 kg/m. If this is out of the aforementioned range listed, please consider follow up with your Primary Care Provider.  If you are age 79 or younger, your body mass index should be between 19-25. Your Body mass index is 23.05 kg/m. If this is out of the aformentioned range listed, please consider follow up with your Primary Care Provider.   ________________________________________________________  The [facility] providers would like to encourage you to use MYCHART to communicate with providers for non-urgent requests or questions.  Due to long hold times on the telephone, sending your provider a message by [facility] may be a faster and more efficient way to get a response.  Please allow 48 business hours for a response.  Please remember that this is for non-urgent requests.  _______________________________________________________  [facility] is using a team-based approach to care.  Your team is made up of your doctor and two to three APPS. Our APPS (Nurse Practitioners and Physician Assistants) work with your physician to ensure care continuity for you. They are fully qualified to address your health concerns and develop a treatment plan. They communicate directly with your gastroenterologist to care for you. Seeing the Advanced Practice Practitioners on your physician's team can help you by facilitating care more promptly, often allowing for earlier appointments, access to diagnostic testing, procedures, and other specialty referrals.   Due to recent changes in healthcare laws, you  may see the results of your imaging and laboratory studies on MyChart before your provider has had a chance to review them.  We understand  that in some cases there may be results that are confusing or concerning to you. Not all laboratory results come back in the same time frame and the provider may be waiting for multiple results in order to interpret others.  Please give us  48 hours in order for your provider to thoroughly review all the results before contacting the office for clarification of your results.   __________________________________________________________________

## 2024-12-30 NOTE — Telephone Encounter (Signed)
 Enter in error

## 2025-01-02 ENCOUNTER — Inpatient Hospital Stay (HOSPITAL_COMMUNITY): Admission: RE | Admit: 2025-01-02 | Source: Ambulatory Visit

## 2025-01-07 ENCOUNTER — Telehealth: Payer: Self-pay

## 2025-01-07 NOTE — Telephone Encounter (Signed)
 Pt called stating that she is  having difficulty getting her mammogram scheduled. According to the chart she was contacted on 1/21 and [facility] was waiting on pt's schedule to schedule the mammogram. Pt states that she was informed they would call her back once they have availability. Pt informed that she should monitor her phone for a call from them and notify us  in a few days if she's still waiting to hear from them and that if needbe we can push back her f/u with Gudena MD. Pt verbalized understanding.

## 2025-01-09 ENCOUNTER — Ambulatory Visit
Admission: RE | Admit: 2025-01-09 | Discharge: 2025-01-09 | Disposition: A | Discharge status: Home / Self Care | Source: Ambulatory Visit | Attending: Hematology and Oncology | Admitting: Hematology and Oncology

## 2025-01-09 DIAGNOSIS — C50412 Malignant neoplasm of upper-outer quadrant of left female breast: Secondary | ICD-10-CM

## 2025-01-09 MED ORDER — IOPAMIDOL (ISOVUE-370) INJECTION 76%
100.0000 mL | Freq: Once | INTRAVENOUS | Status: AC | PRN
  Administered 2025-01-09: 100 mL via INTRAVENOUS

## 2025-01-21 ENCOUNTER — Inpatient Hospital Stay: Payer: Medicare Other | Admitting: Hematology and Oncology

## 2025-01-28 ENCOUNTER — Other Ambulatory Visit (HOSPITAL_COMMUNITY)

## 2025-02-12 ENCOUNTER — Ambulatory Visit: Admitting: Internal Medicine
